# Patient Record
Sex: Female | Born: 1960
Health system: Southern US, Community
[De-identification: ages and names within clinical notes are randomized; demographics above are authoritative.]

## PROBLEM LIST (undated history)

## (undated) DIAGNOSIS — F419 Anxiety disorder, unspecified: Secondary | ICD-10-CM

## (undated) DIAGNOSIS — E669 Obesity, unspecified: Secondary | ICD-10-CM

## (undated) DIAGNOSIS — R569 Unspecified convulsions: Secondary | ICD-10-CM

## (undated) DIAGNOSIS — Z86010 Personal history of colonic polyps: Secondary | ICD-10-CM

## (undated) DIAGNOSIS — M1990 Unspecified osteoarthritis, unspecified site: Secondary | ICD-10-CM

## (undated) DIAGNOSIS — E785 Hyperlipidemia, unspecified: Secondary | ICD-10-CM

## (undated) DIAGNOSIS — K5190 Ulcerative colitis, unspecified, without complications: Secondary | ICD-10-CM

## (undated) DIAGNOSIS — K219 Gastro-esophageal reflux disease without esophagitis: Secondary | ICD-10-CM

## (undated) DIAGNOSIS — K603 Anal fistula, unspecified: Secondary | ICD-10-CM

## (undated) DIAGNOSIS — K449 Diaphragmatic hernia without obstruction or gangrene: Secondary | ICD-10-CM

## (undated) DIAGNOSIS — F329 Major depressive disorder, single episode, unspecified: Secondary | ICD-10-CM

## (undated) DIAGNOSIS — K5730 Diverticulosis of large intestine without perforation or abscess without bleeding: Secondary | ICD-10-CM

## (undated) DIAGNOSIS — K760 Fatty (change of) liver, not elsewhere classified: Secondary | ICD-10-CM

## (undated) DIAGNOSIS — D649 Anemia, unspecified: Secondary | ICD-10-CM

## (undated) DIAGNOSIS — F319 Bipolar disorder, unspecified: Secondary | ICD-10-CM

## (undated) DIAGNOSIS — F3289 Other specified depressive episodes: Secondary | ICD-10-CM

## (undated) DIAGNOSIS — N189 Chronic kidney disease, unspecified: Secondary | ICD-10-CM

## (undated) DIAGNOSIS — I10 Essential (primary) hypertension: Secondary | ICD-10-CM

## (undated) DIAGNOSIS — E611 Iron deficiency: Secondary | ICD-10-CM

## (undated) DIAGNOSIS — Z87440 Personal history of urinary (tract) infections: Secondary | ICD-10-CM

## (undated) DIAGNOSIS — R7303 Prediabetes: Secondary | ICD-10-CM

## (undated) DIAGNOSIS — K859 Acute pancreatitis without necrosis or infection, unspecified: Secondary | ICD-10-CM

## (undated) HISTORY — PX: KNEE ARTHROSCOPY: SUR90

## (undated) HISTORY — DX: Anal fistula, unspecified: K60.30

## (undated) HISTORY — DX: Gastro-esophageal reflux disease without esophagitis: K21.9

## (undated) HISTORY — DX: Bipolar disorder, unspecified: F31.9

## (undated) HISTORY — DX: Personal history of colonic polyps: Z86.010

## (undated) HISTORY — DX: Hyperlipidemia, unspecified: E78.5

## (undated) HISTORY — PX: TOTAL ABDOMINAL HYSTERECTOMY: SHX209

## (undated) HISTORY — DX: Obesity, unspecified: E66.9

## (undated) HISTORY — DX: Anemia, unspecified: D64.9

## (undated) HISTORY — PX: HEMORROIDECTOMY: SUR656

## (undated) HISTORY — DX: Essential (primary) hypertension: I10

## (undated) HISTORY — DX: Anal fistula: K60.3

## (undated) HISTORY — DX: Fatty (change of) liver, not elsewhere classified: K76.0

## (undated) HISTORY — DX: Iron deficiency: E61.1

## (undated) HISTORY — DX: Prediabetes: R73.03

## (undated) HISTORY — DX: Other specified depressive episodes: F32.89

## (undated) HISTORY — DX: Unspecified osteoarthritis, unspecified site: M19.90

## (undated) HISTORY — DX: Chronic kidney disease, unspecified: N18.9

## (undated) HISTORY — DX: Diaphragmatic hernia without obstruction or gangrene: K44.9

## (undated) HISTORY — DX: Major depressive disorder, single episode, unspecified: F32.9

## (undated) HISTORY — DX: Diverticulosis of large intestine without perforation or abscess without bleeding: K57.30

## (undated) HISTORY — DX: Ulcerative colitis, unspecified, without complications: K51.90

## (undated) HISTORY — DX: Anxiety disorder, unspecified: F41.9

---

## 1997-11-06 ENCOUNTER — Inpatient Hospital Stay (HOSPITAL_COMMUNITY): Admission: EM | Admit: 1997-11-06 | Discharge: 1997-11-11 | Payer: Self-pay | Admitting: Emergency Medicine

## 1998-05-17 ENCOUNTER — Inpatient Hospital Stay (HOSPITAL_COMMUNITY): Admission: EM | Admit: 1998-05-17 | Discharge: 1998-05-18 | Payer: Self-pay | Admitting: Emergency Medicine

## 1998-09-11 ENCOUNTER — Emergency Department (HOSPITAL_COMMUNITY): Admission: EM | Admit: 1998-09-11 | Discharge: 1998-09-12 | Payer: Self-pay | Admitting: Internal Medicine

## 1998-10-06 ENCOUNTER — Emergency Department (HOSPITAL_COMMUNITY): Admission: EM | Admit: 1998-10-06 | Discharge: 1998-10-07 | Payer: Self-pay | Admitting: Emergency Medicine

## 1998-10-27 ENCOUNTER — Emergency Department (HOSPITAL_COMMUNITY): Admission: EM | Admit: 1998-10-27 | Discharge: 1998-10-27 | Payer: Self-pay | Admitting: Emergency Medicine

## 1998-11-05 ENCOUNTER — Emergency Department (HOSPITAL_COMMUNITY): Admission: EM | Admit: 1998-11-05 | Discharge: 1998-11-05 | Payer: Self-pay | Admitting: Emergency Medicine

## 2000-07-19 ENCOUNTER — Emergency Department (HOSPITAL_COMMUNITY): Admission: EM | Admit: 2000-07-19 | Discharge: 2000-07-19 | Payer: Self-pay | Admitting: Emergency Medicine

## 2000-08-24 ENCOUNTER — Encounter (INDEPENDENT_AMBULATORY_CARE_PROVIDER_SITE_OTHER): Payer: Self-pay | Admitting: Specialist

## 2000-08-24 ENCOUNTER — Other Ambulatory Visit: Admission: RE | Admit: 2000-08-24 | Discharge: 2000-08-24 | Payer: Self-pay | Admitting: Obstetrics & Gynecology

## 2000-09-13 ENCOUNTER — Encounter (INDEPENDENT_AMBULATORY_CARE_PROVIDER_SITE_OTHER): Payer: Self-pay

## 2000-09-13 ENCOUNTER — Inpatient Hospital Stay (HOSPITAL_COMMUNITY): Admission: RE | Admit: 2000-09-13 | Discharge: 2000-09-16 | Payer: Self-pay | Admitting: Obstetrics & Gynecology

## 2000-09-13 ENCOUNTER — Encounter (INDEPENDENT_AMBULATORY_CARE_PROVIDER_SITE_OTHER): Payer: Self-pay | Admitting: Specialist

## 2000-10-13 ENCOUNTER — Encounter: Admission: RE | Admit: 2000-10-13 | Discharge: 2000-10-13 | Payer: Self-pay | Admitting: Obstetrics & Gynecology

## 2000-10-13 ENCOUNTER — Encounter: Payer: Self-pay | Admitting: Obstetrics & Gynecology

## 2001-12-11 ENCOUNTER — Other Ambulatory Visit: Admission: RE | Admit: 2001-12-11 | Discharge: 2001-12-11 | Payer: Self-pay | Admitting: Obstetrics and Gynecology

## 2002-11-14 ENCOUNTER — Encounter: Admission: RE | Admit: 2002-11-14 | Discharge: 2002-11-14 | Payer: Self-pay | Admitting: Internal Medicine

## 2003-02-25 ENCOUNTER — Encounter: Payer: Self-pay | Admitting: Gastroenterology

## 2004-01-14 ENCOUNTER — Ambulatory Visit: Payer: Self-pay | Admitting: Internal Medicine

## 2004-03-26 ENCOUNTER — Encounter: Admission: RE | Admit: 2004-03-26 | Discharge: 2004-03-26 | Payer: Self-pay | Admitting: Obstetrics & Gynecology

## 2004-04-15 ENCOUNTER — Ambulatory Visit: Payer: Self-pay | Admitting: Internal Medicine

## 2004-04-30 ENCOUNTER — Ambulatory Visit: Payer: Self-pay | Admitting: Internal Medicine

## 2004-05-15 ENCOUNTER — Ambulatory Visit: Payer: Self-pay | Admitting: Internal Medicine

## 2004-07-22 ENCOUNTER — Ambulatory Visit: Payer: Self-pay | Admitting: Internal Medicine

## 2004-08-31 ENCOUNTER — Ambulatory Visit: Payer: Self-pay | Admitting: Gastroenterology

## 2005-05-05 ENCOUNTER — Ambulatory Visit: Payer: Self-pay | Admitting: Internal Medicine

## 2005-05-18 ENCOUNTER — Ambulatory Visit: Payer: Self-pay | Admitting: Internal Medicine

## 2005-05-21 ENCOUNTER — Ambulatory Visit: Payer: Self-pay | Admitting: Cardiovascular Disease

## 2005-06-22 ENCOUNTER — Ambulatory Visit: Payer: Self-pay | Admitting: Internal Medicine

## 2005-10-07 ENCOUNTER — Ambulatory Visit: Payer: Self-pay | Admitting: Internal Medicine

## 2005-10-26 ENCOUNTER — Ambulatory Visit: Payer: Self-pay | Admitting: Internal Medicine

## 2005-10-28 ENCOUNTER — Encounter (INDEPENDENT_AMBULATORY_CARE_PROVIDER_SITE_OTHER): Payer: Self-pay | Admitting: Specialist

## 2005-10-28 ENCOUNTER — Ambulatory Visit: Admission: RE | Admit: 2005-10-28 | Discharge: 2005-10-28 | Payer: Self-pay | Admitting: Internal Medicine

## 2005-10-28 ENCOUNTER — Ambulatory Visit: Payer: Self-pay | Admitting: Internal Medicine

## 2005-11-09 ENCOUNTER — Ambulatory Visit: Payer: Self-pay | Admitting: Internal Medicine

## 2005-11-11 ENCOUNTER — Ambulatory Visit: Payer: Self-pay | Admitting: Gastroenterology

## 2005-11-12 ENCOUNTER — Ambulatory Visit: Payer: Self-pay | Admitting: Gastroenterology

## 2005-12-17 ENCOUNTER — Ambulatory Visit: Payer: Self-pay | Admitting: Internal Medicine

## 2005-12-29 ENCOUNTER — Ambulatory Visit: Payer: Self-pay | Admitting: Internal Medicine

## 2006-01-24 ENCOUNTER — Ambulatory Visit: Payer: Self-pay | Admitting: Internal Medicine

## 2006-02-21 ENCOUNTER — Ambulatory Visit: Payer: Self-pay | Admitting: Internal Medicine

## 2006-03-23 ENCOUNTER — Ambulatory Visit: Payer: Self-pay | Admitting: Internal Medicine

## 2006-04-20 ENCOUNTER — Ambulatory Visit: Payer: Self-pay | Admitting: Internal Medicine

## 2006-05-02 ENCOUNTER — Ambulatory Visit (HOSPITAL_COMMUNITY): Admission: RE | Admit: 2006-05-02 | Discharge: 2006-05-02 | Payer: Self-pay | Admitting: Gastroenterology

## 2006-05-17 ENCOUNTER — Ambulatory Visit: Payer: Self-pay | Admitting: Internal Medicine

## 2006-05-29 ENCOUNTER — Ambulatory Visit (HOSPITAL_COMMUNITY): Admission: RE | Admit: 2006-05-29 | Discharge: 2006-05-29 | Payer: Self-pay | Admitting: Unknown Physician Specialty

## 2006-06-10 ENCOUNTER — Ambulatory Visit: Payer: Self-pay | Admitting: Internal Medicine

## 2006-07-06 ENCOUNTER — Ambulatory Visit: Payer: Self-pay | Admitting: Internal Medicine

## 2006-07-22 ENCOUNTER — Ambulatory Visit: Payer: Self-pay | Admitting: Gastroenterology

## 2006-08-02 ENCOUNTER — Ambulatory Visit (HOSPITAL_COMMUNITY): Admission: RE | Admit: 2006-08-02 | Discharge: 2006-08-02 | Payer: Self-pay | Admitting: Internal Medicine

## 2006-08-03 ENCOUNTER — Ambulatory Visit: Payer: Self-pay | Admitting: Internal Medicine

## 2006-08-22 ENCOUNTER — Encounter: Admission: RE | Admit: 2006-08-22 | Discharge: 2006-08-22 | Payer: Self-pay | Admitting: Obstetrics & Gynecology

## 2006-08-31 ENCOUNTER — Ambulatory Visit: Payer: Self-pay | Admitting: Pulmonary Disease

## 2006-09-20 ENCOUNTER — Ambulatory Visit: Payer: Self-pay | Admitting: Pulmonary Disease

## 2006-10-18 ENCOUNTER — Ambulatory Visit: Payer: Self-pay | Admitting: Pulmonary Disease

## 2006-12-06 ENCOUNTER — Ambulatory Visit: Payer: Self-pay | Admitting: Pulmonary Disease

## 2006-12-06 DIAGNOSIS — R059 Cough, unspecified: Secondary | ICD-10-CM

## 2006-12-06 DIAGNOSIS — K219 Gastro-esophageal reflux disease without esophagitis: Secondary | ICD-10-CM | POA: Insufficient documentation

## 2006-12-06 DIAGNOSIS — F17200 Nicotine dependence, unspecified, uncomplicated: Secondary | ICD-10-CM

## 2006-12-06 DIAGNOSIS — R05 Cough: Secondary | ICD-10-CM

## 2006-12-06 DIAGNOSIS — F319 Bipolar disorder, unspecified: Secondary | ICD-10-CM

## 2006-12-06 HISTORY — DX: Cough, unspecified: R05.9

## 2006-12-06 HISTORY — DX: Bipolar disorder, unspecified: F31.9

## 2006-12-06 HISTORY — DX: Nicotine dependence, unspecified, uncomplicated: F17.200

## 2007-01-23 ENCOUNTER — Ambulatory Visit: Payer: Self-pay | Admitting: Pulmonary Disease

## 2007-02-03 ENCOUNTER — Ambulatory Visit: Payer: Self-pay | Admitting: Pulmonary Disease

## 2007-03-03 ENCOUNTER — Ambulatory Visit (HOSPITAL_BASED_OUTPATIENT_CLINIC_OR_DEPARTMENT_OTHER): Admission: RE | Admit: 2007-03-03 | Discharge: 2007-03-03 | Payer: Self-pay | Admitting: Orthopedic Surgery

## 2007-04-03 ENCOUNTER — Telehealth (INDEPENDENT_AMBULATORY_CARE_PROVIDER_SITE_OTHER): Payer: Self-pay | Admitting: *Deleted

## 2007-04-03 ENCOUNTER — Ambulatory Visit: Payer: Self-pay | Admitting: Pulmonary Disease

## 2007-04-21 ENCOUNTER — Ambulatory Visit (HOSPITAL_BASED_OUTPATIENT_CLINIC_OR_DEPARTMENT_OTHER): Admission: RE | Admit: 2007-04-21 | Discharge: 2007-04-21 | Payer: Self-pay | Admitting: Orthopedic Surgery

## 2007-05-04 ENCOUNTER — Telehealth (INDEPENDENT_AMBULATORY_CARE_PROVIDER_SITE_OTHER): Payer: Self-pay | Admitting: *Deleted

## 2007-05-12 ENCOUNTER — Ambulatory Visit: Payer: Self-pay | Admitting: Pulmonary Disease

## 2007-05-13 ENCOUNTER — Telehealth: Payer: Self-pay | Admitting: Pulmonary Disease

## 2007-05-15 ENCOUNTER — Telehealth (INDEPENDENT_AMBULATORY_CARE_PROVIDER_SITE_OTHER): Payer: Self-pay | Admitting: *Deleted

## 2007-05-17 ENCOUNTER — Telehealth: Payer: Self-pay | Admitting: Pulmonary Disease

## 2007-09-11 ENCOUNTER — Telehealth: Payer: Self-pay | Admitting: Internal Medicine

## 2007-09-12 ENCOUNTER — Ambulatory Visit: Payer: Self-pay | Admitting: Gastroenterology

## 2007-09-12 DIAGNOSIS — K449 Diaphragmatic hernia without obstruction or gangrene: Secondary | ICD-10-CM

## 2007-09-12 DIAGNOSIS — K649 Unspecified hemorrhoids: Secondary | ICD-10-CM

## 2007-09-12 DIAGNOSIS — F329 Major depressive disorder, single episode, unspecified: Secondary | ICD-10-CM | POA: Insufficient documentation

## 2007-09-12 DIAGNOSIS — K6289 Other specified diseases of anus and rectum: Secondary | ICD-10-CM

## 2007-09-12 DIAGNOSIS — K5190 Ulcerative colitis, unspecified, without complications: Secondary | ICD-10-CM | POA: Insufficient documentation

## 2007-09-12 HISTORY — DX: Diaphragmatic hernia without obstruction or gangrene: K44.9

## 2007-09-12 HISTORY — DX: Other specified diseases of anus and rectum: K62.89

## 2007-09-12 HISTORY — DX: Unspecified hemorrhoids: K64.9

## 2007-09-13 ENCOUNTER — Encounter: Payer: Self-pay | Admitting: Gastroenterology

## 2007-09-14 ENCOUNTER — Telehealth: Payer: Self-pay | Admitting: Gastroenterology

## 2007-09-18 ENCOUNTER — Encounter
Admission: RE | Admit: 2007-09-18 | Discharge: 2007-09-18 | Payer: Self-pay | Admitting: Physical Medicine & Rehabilitation

## 2007-10-04 ENCOUNTER — Encounter: Payer: Self-pay | Admitting: Gastroenterology

## 2007-10-25 ENCOUNTER — Ambulatory Visit: Payer: Self-pay | Admitting: Pulmonary Disease

## 2007-11-17 ENCOUNTER — Encounter (INDEPENDENT_AMBULATORY_CARE_PROVIDER_SITE_OTHER): Payer: Self-pay | Admitting: *Deleted

## 2007-11-17 ENCOUNTER — Ambulatory Visit (HOSPITAL_COMMUNITY): Admission: RE | Admit: 2007-11-17 | Discharge: 2007-11-17 | Payer: Self-pay | Admitting: *Deleted

## 2007-11-30 ENCOUNTER — Encounter: Payer: Self-pay | Admitting: Gastroenterology

## 2008-02-23 HISTORY — PX: FINGER AMPUTATION: SHX636

## 2008-02-24 ENCOUNTER — Telehealth: Payer: Self-pay | Admitting: Internal Medicine

## 2008-05-06 ENCOUNTER — Telehealth (INDEPENDENT_AMBULATORY_CARE_PROVIDER_SITE_OTHER): Payer: Self-pay | Admitting: *Deleted

## 2008-06-13 ENCOUNTER — Emergency Department (HOSPITAL_COMMUNITY): Admission: EM | Admit: 2008-06-13 | Discharge: 2008-06-13 | Payer: Self-pay | Admitting: Emergency Medicine

## 2008-07-03 ENCOUNTER — Ambulatory Visit (HOSPITAL_BASED_OUTPATIENT_CLINIC_OR_DEPARTMENT_OTHER): Admission: RE | Admit: 2008-07-03 | Discharge: 2008-07-03 | Payer: Self-pay | Admitting: Plastic Surgery

## 2008-07-03 ENCOUNTER — Encounter (INDEPENDENT_AMBULATORY_CARE_PROVIDER_SITE_OTHER): Payer: Self-pay | Admitting: Plastic Surgery

## 2008-09-25 ENCOUNTER — Telehealth: Payer: Self-pay | Admitting: Gastroenterology

## 2008-09-27 ENCOUNTER — Ambulatory Visit: Payer: Self-pay | Admitting: Gastroenterology

## 2008-09-27 ENCOUNTER — Telehealth: Payer: Self-pay | Admitting: Physician Assistant

## 2008-09-27 DIAGNOSIS — R110 Nausea: Secondary | ICD-10-CM | POA: Insufficient documentation

## 2008-09-27 DIAGNOSIS — R1084 Generalized abdominal pain: Secondary | ICD-10-CM

## 2008-09-27 DIAGNOSIS — A09 Infectious gastroenteritis and colitis, unspecified: Secondary | ICD-10-CM

## 2008-09-27 DIAGNOSIS — K5120 Ulcerative (chronic) proctitis without complications: Secondary | ICD-10-CM | POA: Insufficient documentation

## 2008-09-27 DIAGNOSIS — R112 Nausea with vomiting, unspecified: Secondary | ICD-10-CM | POA: Insufficient documentation

## 2008-09-27 DIAGNOSIS — R197 Diarrhea, unspecified: Secondary | ICD-10-CM

## 2008-09-27 HISTORY — DX: Nausea: R11.0

## 2008-09-27 HISTORY — DX: Nausea with vomiting, unspecified: R11.2

## 2008-09-27 HISTORY — DX: Infectious gastroenteritis and colitis, unspecified: A09

## 2008-09-27 HISTORY — DX: Generalized abdominal pain: R10.84

## 2008-09-27 HISTORY — DX: Diarrhea, unspecified: R19.7

## 2008-09-28 ENCOUNTER — Telehealth: Payer: Self-pay | Admitting: Internal Medicine

## 2008-09-30 ENCOUNTER — Telehealth: Payer: Self-pay | Admitting: Gastroenterology

## 2008-09-30 DIAGNOSIS — R1013 Epigastric pain: Secondary | ICD-10-CM | POA: Insufficient documentation

## 2008-10-01 ENCOUNTER — Encounter: Payer: Self-pay | Admitting: Physician Assistant

## 2008-10-01 LAB — CONVERTED CEMR LAB
AST: 72 units/L — ABNORMAL HIGH (ref 0–37)
Alkaline Phosphatase: 75 units/L (ref 39–117)
BUN: 24 mg/dL — ABNORMAL HIGH (ref 6–23)
Basophils Relative: 1.1 % (ref 0.0–3.0)
Creatinine, Ser: 0.9 mg/dL (ref 0.4–1.2)
Eosinophils Absolute: 0.1 10*3/uL (ref 0.0–0.7)
Hemoglobin: 13.6 g/dL (ref 12.0–15.0)
MCHC: 34.7 g/dL (ref 30.0–36.0)
MCV: 93.7 fL (ref 78.0–100.0)
Monocytes Absolute: 0.5 10*3/uL (ref 0.1–1.0)
Neutro Abs: 1.8 10*3/uL (ref 1.4–7.7)
RBC: 4.18 M/uL (ref 3.87–5.11)
Total Bilirubin: 0.8 mg/dL (ref 0.3–1.2)

## 2008-10-02 ENCOUNTER — Ambulatory Visit (HOSPITAL_COMMUNITY): Admission: RE | Admit: 2008-10-02 | Discharge: 2008-10-02 | Payer: Self-pay | Admitting: Gastroenterology

## 2008-10-03 ENCOUNTER — Ambulatory Visit: Payer: Self-pay | Admitting: *Deleted

## 2008-10-03 ENCOUNTER — Emergency Department (HOSPITAL_COMMUNITY): Admission: EM | Admit: 2008-10-03 | Discharge: 2008-10-03 | Payer: Self-pay | Admitting: Emergency Medicine

## 2008-10-03 ENCOUNTER — Inpatient Hospital Stay (HOSPITAL_COMMUNITY): Admission: RE | Admit: 2008-10-03 | Discharge: 2008-10-07 | Payer: Self-pay | Admitting: *Deleted

## 2008-10-11 ENCOUNTER — Telehealth: Payer: Self-pay | Admitting: Gastroenterology

## 2008-10-17 ENCOUNTER — Telehealth (INDEPENDENT_AMBULATORY_CARE_PROVIDER_SITE_OTHER): Payer: Self-pay | Admitting: *Deleted

## 2008-10-21 ENCOUNTER — Telehealth: Payer: Self-pay | Admitting: Physician Assistant

## 2009-02-02 ENCOUNTER — Emergency Department (HOSPITAL_COMMUNITY): Admission: EM | Admit: 2009-02-02 | Discharge: 2009-02-02 | Payer: Self-pay | Admitting: Emergency Medicine

## 2009-02-03 ENCOUNTER — Emergency Department (HOSPITAL_COMMUNITY): Admission: EM | Admit: 2009-02-03 | Discharge: 2009-02-03 | Payer: Self-pay | Admitting: Emergency Medicine

## 2009-02-10 ENCOUNTER — Emergency Department (HOSPITAL_COMMUNITY): Admission: EM | Admit: 2009-02-10 | Discharge: 2009-02-10 | Payer: Self-pay | Admitting: Emergency Medicine

## 2009-02-17 ENCOUNTER — Emergency Department (HOSPITAL_COMMUNITY): Admission: EM | Admit: 2009-02-17 | Discharge: 2009-02-17 | Payer: Self-pay | Admitting: Emergency Medicine

## 2009-03-10 ENCOUNTER — Ambulatory Visit: Payer: Self-pay | Admitting: Family Medicine

## 2009-03-10 LAB — CONVERTED CEMR LAB
Albumin: 4.2 g/dL (ref 3.5–5.2)
Alkaline Phosphatase: 71 units/L (ref 39–117)
BUN: 16 mg/dL (ref 6–23)
CO2: 24 meq/L (ref 19–32)
Glucose, Bld: 126 mg/dL — ABNORMAL HIGH (ref 70–99)
Hgb A1c MFr Bld: 5.3 % (ref 4.6–6.1)
Leukocytes, UA: NEGATIVE
Nitrite: NEGATIVE
Potassium: 3.9 meq/L (ref 3.5–5.3)
Protein, ur: 100 mg/dL — AB
Squamous Epithelial / LPF: NONE SEEN /lpf
Urine Glucose: NEGATIVE mg/dL
Urobilinogen, UA: 0.2 (ref 0.0–1.0)

## 2009-03-12 ENCOUNTER — Ambulatory Visit: Payer: Self-pay | Admitting: Family Medicine

## 2009-04-16 ENCOUNTER — Telehealth (INDEPENDENT_AMBULATORY_CARE_PROVIDER_SITE_OTHER): Payer: Self-pay | Admitting: *Deleted

## 2009-05-25 ENCOUNTER — Emergency Department (HOSPITAL_COMMUNITY): Admission: EM | Admit: 2009-05-25 | Discharge: 2009-05-25 | Payer: Self-pay | Admitting: Family Medicine

## 2009-07-14 ENCOUNTER — Ambulatory Visit: Payer: Self-pay | Admitting: Family Medicine

## 2009-07-15 ENCOUNTER — Encounter (INDEPENDENT_AMBULATORY_CARE_PROVIDER_SITE_OTHER): Payer: Self-pay | Admitting: Family Medicine

## 2009-08-29 ENCOUNTER — Telehealth: Payer: Self-pay | Admitting: Gastroenterology

## 2009-09-01 ENCOUNTER — Ambulatory Visit: Payer: Self-pay | Admitting: Internal Medicine

## 2009-09-03 ENCOUNTER — Ambulatory Visit (HOSPITAL_COMMUNITY): Admission: RE | Admit: 2009-09-03 | Discharge: 2009-09-03 | Payer: Self-pay | Admitting: Neurosurgery

## 2009-10-06 ENCOUNTER — Ambulatory Visit: Payer: Self-pay | Admitting: Family Medicine

## 2009-10-06 LAB — CONVERTED CEMR LAB
CRP: 0.2 mg/dL (ref <0.6)
Sed Rate: 15 mm/hr (ref 0–22)

## 2009-10-07 ENCOUNTER — Encounter (INDEPENDENT_AMBULATORY_CARE_PROVIDER_SITE_OTHER): Payer: Self-pay | Admitting: Family Medicine

## 2009-10-08 ENCOUNTER — Encounter (INDEPENDENT_AMBULATORY_CARE_PROVIDER_SITE_OTHER): Payer: Self-pay | Admitting: *Deleted

## 2009-10-08 ENCOUNTER — Telehealth: Payer: Self-pay | Admitting: Gastroenterology

## 2009-10-10 ENCOUNTER — Ambulatory Visit: Payer: Self-pay | Admitting: Gastroenterology

## 2009-10-10 ENCOUNTER — Encounter (INDEPENDENT_AMBULATORY_CARE_PROVIDER_SITE_OTHER): Payer: Self-pay | Admitting: *Deleted

## 2009-10-13 ENCOUNTER — Encounter: Payer: Self-pay | Admitting: Gastroenterology

## 2009-10-15 ENCOUNTER — Ambulatory Visit: Payer: Self-pay | Admitting: Gastroenterology

## 2009-10-16 DIAGNOSIS — Z860100 Personal history of colon polyps, unspecified: Secondary | ICD-10-CM

## 2009-10-16 HISTORY — DX: Personal history of colonic polyps: Z86.010

## 2009-10-16 HISTORY — DX: Personal history of colon polyps, unspecified: Z86.0100

## 2009-10-19 ENCOUNTER — Emergency Department (HOSPITAL_COMMUNITY): Admission: EM | Admit: 2009-10-19 | Discharge: 2009-10-19 | Payer: Self-pay | Admitting: Emergency Medicine

## 2009-10-19 ENCOUNTER — Emergency Department (HOSPITAL_COMMUNITY): Admission: EM | Admit: 2009-10-19 | Discharge: 2009-10-19 | Payer: Self-pay | Admitting: Family Medicine

## 2009-10-20 ENCOUNTER — Encounter: Payer: Self-pay | Admitting: Gastroenterology

## 2009-10-22 DIAGNOSIS — Z860100 Personal history of colon polyps, unspecified: Secondary | ICD-10-CM | POA: Insufficient documentation

## 2009-10-22 DIAGNOSIS — K5730 Diverticulosis of large intestine without perforation or abscess without bleeding: Secondary | ICD-10-CM | POA: Insufficient documentation

## 2009-10-28 ENCOUNTER — Ambulatory Visit: Payer: Self-pay | Admitting: Gastroenterology

## 2010-01-05 ENCOUNTER — Emergency Department (HOSPITAL_COMMUNITY)
Admission: EM | Admit: 2010-01-05 | Discharge: 2010-01-05 | Payer: Self-pay | Source: Home / Self Care | Admitting: Emergency Medicine

## 2010-03-18 ENCOUNTER — Ambulatory Visit (HOSPITAL_COMMUNITY)
Admission: RE | Admit: 2010-03-18 | Discharge: 2010-03-18 | Payer: Self-pay | Source: Home / Self Care | Attending: Family Medicine | Admitting: Family Medicine

## 2010-03-26 NOTE — Procedures (Signed)
Summary: Colonoscopy  Patient: [name] Note: All result statuses are Final unless otherwise noted.  Tests: (1) Colonoscopy (COL)   COL Colonoscopy           DONE     [facility]     [address]     [place], [place]  [address]           COLONOSCOPY PROCEDURE REPORT           PATIENT:  [name], [name]  MR#:  [id number]     BIRTHDATE:  07-May-1960, 48 yrs. old  GENDER:  female     ENDOSCOPIST:  Vania Rea. Jarold Motto, MD, [facility]     REF. BY:     PROCEDURE DATE:  10/15/2009     PROCEDURE:  Colonoscopy with biopsy and snare polypectomy     ASA CLASS:  Class II     INDICATIONS:  Abdominal pain, hematochezia     MEDICATIONS:   Fentanyl 100 mcg IV, Versed 9 mg IV           DESCRIPTION OF PROCEDURE:   After the risks benefits and     alternatives of the procedure were thoroughly explained, informed     consent was obtained.  Digital rectal exam was performed and     revealed tender.   The [id number] endoscope was introduced     through the anus and advanced to the cecum, which was identified     by both the appendix and ileocecal valve, without limitations.     The quality of the prep was excellent, using MoviPrep.  The     instrument was then slowly withdrawn as the colon was fully     examined.     <<PROCEDUREIMAGES>>           FINDINGS:  Mild diverticulosis was found in the sigmoid to     descending colon segments.  A sessile polyp was found. 5mm flat     polyp hot snare excised from rectum.  This was otherwise a normal     examination of the colon. rectal biopsies done.no colitis     visualized.  External hemorrhoids were found.   Retroflexed views     in the rectum revealed no abnormalities.    The scope was then     withdrawn from the patient and the procedure completed.           COMPLICATIONS:  None     ENDOSCOPIC IMPRESSION:     1) Mild diverticulosis in the sigmoid to descending colon     segments     2) Sessile polyp     3) Otherwise normal examination  4) External hemorrhoids     1.PROBABLE HEALED PROCTITIS PER HX. OF LEFT SIDED UC.     2.R/O ADENOMA     RECOMMENDATIONS:     1) Await pathology results     2) Titrate to need     3) High fiber diet.     4) Out patient follow-up in 2 weeks.     5) Repeat Colonoscopy in 5 years.     REPEAT EXAM:  No           ______________________________     Vania Rea. Jarold Motto, MD, [facility]           CC:           n.     eSIGNED:   Vania Rea. Patterson at 10/15/2009 02:41 PM  Kelsy, [place], [id number]  Note: An exclamation mark (!) indicates a result that was not dispersed into the flowsheet. Document Creation Date: 10/15/2009 2:42 PM _______________________________________________________________________  (1) Order result status: Final Collection or observation date-time: 10/15/2009 14:34 Requested date-time:  Receipt date-time:  Reported date-time:  Referring Physician:   Ordering Physician: [name] [phone]) Specimen Source:  Source: Launa Grill Order Number: [id number] Lab site:   Appended Document: Colonoscopy     Procedures Next Due Date:    Colonoscopy: 09/2014

## 2010-03-26 NOTE — Letter (Signed)
Summary: New Patient letter  [facility]  [address] [place], [place] [address]   Phone: [phone]  Fax: [phone]       10/08/2009 MRN: [id number]  [facility] [address] [place], [place]  [address]  Dear [name] [name],  Welcome to the Gastroenterology Division at [facility].    You are scheduled to see Dr. Jarold Motto on 12/04/2009 at 9:00AM on the 3rd floor at [facility], [address]  We ask that you try to arrive at our office 15 minutes prior to your appointment time to allow for check-in.  We would like you to complete the enclosed self-administered evaluation form prior to your visit and bring it with you on the day of your appointment.  We will review it with you.  Also, please bring a complete list of all your medications or, if you prefer, bring the medication bottles and we will list them.  Please bring your insurance card so that we may make a copy of it.  If your insurance requires a referral to see a specialist, please bring your referral form from your primary care physician.  Co-payments are due at the time of your visit and may be paid by cash, check or credit card.     Your office visit will consist of a consult with your physician (includes a physical exam), any laboratory testing he/she may order, scheduling of any necessary diagnostic testing (e.g. x-ray, ultrasound, CT-scan), and scheduling of a procedure (e.g. Endoscopy, Colonoscopy) if required.  Please allow enough time on your schedule to allow for any/all of these possibilities.    If you cannot keep your appointment, please call [phone] to cancel or reschedule prior to your appointment date.  This allows [place] the opportunity to schedule an appointment for another patient in need of care.  If you do not cancel or reschedule by 5 p.m. the business day prior to your appointment date, you will be charged a $50.00 late cancellation/no-show fee.    Thank you for choosing  [facility] for your medical needs.  We appreciate the opportunity to care for you.  Please visit [place] at our website  to learn more about our practice.                     Sincerely,                                                             The Gastroenterology Division

## 2010-03-26 NOTE — Letter (Signed)
Summary: Patient Notice- Colon Biospy Results  [facility]  [address] [place], [place] [address]   Phone: [phone]  Fax: [phone]        October 20, 2009 MRN: [id number]    [facility] [address] [place], [place]  [address]    Dear Ms. Vaccaro,  I am pleased to inform you that the biopsies taken during your recent colonoscopy did not show any evidence of cancer upon pathologic examination.  Additional information/recommendations:  __No further action is needed at this time.  Please follow-up with      your primary care physician for your other healthcare needs.  __Please call [phone] to schedule a return visit to review      your condition.  x__Continue with the treatment plan as outlined on the day of your      exam.Biopsies are consistent healed proctitis.  __You should have a repeat colonoscopy examination for this problem           in _ years.  Please call us if you are having persistent problems or have questions about your condition that have not been fully answered at this time.  Sincerely,  Mardella Layman MD [facility]   This letter has been electronically signed by your physician.  Appended Document: Patient Notice- Colon Biospy Results letter mailed 9.2.11

## 2010-03-26 NOTE — Assessment & Plan Note (Signed)
Summary: f/u from COL--ch.    History of Present Illness Primary GI MD: [name] MD FACP FAGA Primary Xinyi Batton: Syliva Overman, MD Chief Complaint: f/u after colonoscopy, pt doing very well and denies bleeding or hemorrhiod problems. Pt has stopped the Canasa b/c she hasn't had any bleeding.  History of Present Illness:   no current problems with diarrhea, rectal bleeding, or abdominal cramping. She continues with acid reflux taking p.r.n. H2 blockers. There is no associated dysphagia. Colon biopsies were consistent with chronic healed colitis.   GI Review of Systems      Denies abdominal pain, acid reflux, belching, bloating, chest pain, dysphagia with liquids, dysphagia with solids, heartburn, loss of appetite, nausea, vomiting, vomiting blood, weight loss, and  weight gain.        Denies anal fissure, black tarry stools, change in bowel habit, constipation, diarrhea, diverticulosis, fecal incontinence, heme positive stool, hemorrhoids, irritable bowel syndrome, jaundice, light color stool, liver problems, rectal bleeding, and  rectal pain.    Current Medications (verified): 1)  Abilify 15 Mg Tabs (Aripiprazole) .... One Tablet By Mouth Once Daily 2)  Lamictal 100 Mg Tabs (Lamotrigine) .... One Tablet By Mouth Two Times A Day 3)  Delsym 30 Mg/60ml  Lqcr (Dextromethorphan Polistirex) .... Two Times A Day Prn 4)  Promethazine Hcl 25 Mg  Tabs (Promethazine Hcl) .... 1/2-1 By Mouth Every 8 Hrs As Needed For Nausea and Vomiting 5)  Prilosec Otc 20 Mg Tbec (Omeprazole Magnesium) .... Take 1 Tab 30 Min Prior To Breakfast and Dinner 6)  Promethazine Hcl 25 Mg Tabs (Promethazine Hcl) .... Take 1/2 To 1 Tab  Every 6 Hours As Needed For Nausea 7)  Famotidine 20 Mg Tabs (Famotidine) .... One Tablet By Mouth Two Times A Day 8)  Prozac 20 Mg Caps (Fluoxetine Hcl) .... One Tablet By Mouth Two Times A Day 9)  Promethazine Hcl 25 Mg Supp (Promethazine Hcl) .... One Suppository Into Rectum As Needed  Every 6-8 Hours 10)  Vicodin 5-500 Mg Tabs (Hydrocodone-Acetaminophen) .... One Tablet By Mouth Four Times A Day 11)  Imodium A-D 2 Mg Tabs (Loperamide Hcl) .... As Needed  Allergies (verified): No Known Drug Allergies  Past History:  Past medical, surgical, family and social histories (including risk factors) reviewed for relevance to current acute and chronic problems.  Past Medical History: Reviewed history from 10/10/2009 and no changes required. Family history of OVARIAN CANCER (ICD-V16.41) Family history of DIABETES (ICD-V18.0) Hx of TOBACCO ABUSE (ICD-305.1) Hx of BIPOLAR AFFECTIVE DISORDER (ICD-296.80) Hx of GASTROESOPHAGEAL REFLUX DISEASE (ICD-530.81) ULCERATIVE PROCTITS 2005  Kidney Stones  Past Surgical History: Reviewed history from 09/27/2008 and no changes required.  Total Abdominal Hysterectomy Finger amputation 2010 2 Knee arthroscopy Hemorrhoidectomy  Family History: Reviewed history from 09/27/2008 and no changes required. Diabetes MI/Heart Attack Ovarian Cancer Family History of Irritable Bowel Syndrome:mother  Social History: Reviewed history from 09/27/2008 and no changes required. 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 Patient is a current smoker.  quit 2 weeks ago Alcohol Use - no Illicit Drug Use - no  Review of Systems  The patient denies allergy/sinus, anemia, anxiety-new, arthritis/joint pain, back pain, blood in urine, breast changes/lumps, change in vision, confusion, cough, coughing up blood, depression-new, fainting, fatigue, fever, headaches-new, hearing problems, heart murmur, heart rhythm changes, itching, menstrual pain, muscle pains/cramps, night sweats, nosebleeds, pregnancy symptoms, shortness of breath, skin rash, sleeping problems, sore throat, swelling of feet/legs, swollen lymph glands, thirst - excessive , urination - excessive ,  urination changes/pain, urine leakage, vision changes, and voice change.    Vital  Signs:  Patient profile:   50 year old female Height:      65 inches Weight:      231.25 pounds BMI:     38.62 Pulse rate:   80 / minute Pulse rhythm:   regular BP sitting:   122 / 74  (right arm) Cuff size:   regular  Vitals Entered By: Christie Nottingham CMA Duncan Dull) (October 28, 2009 9:55 AM)  Physical Exam  General:  Well developed, well nourished, no acute distress.healthy appearing and obese.   Head:  Normocephalic and atraumatic. Eyes:  PERRLA, no icterus. Psych:  Alert and cooperative. Normal mood and affect.   Impression & Recommendations:  Problem # 1:  ULCERATIVE PROCTITIS (ICD-556.2) Assessment Improved She Does not have chronic relapsing colitis, and we will see how she does symptomatically off all medications at this time. She may need chronic oral amino salicylate therapy and periodic Canasa suppositories.  Problem # 2:  GERD (ICD-530.81) Assessment: Deteriorated Reflux Regime reviewed, and I have given her samples of omeprazole 20 mg to take 30 minutes before supper because most of her symptoms are nocturnal in nature.  Problem # 3:  ABDOMINAL PAIN -GENERALIZED (ICD-789.07) Assessment: Improved  She has an element of chronic IBS.  Patient Instructions: 1)  Copy sent to : Syliva Overman, MD 2)  Please continue current medications.  3)  Take your Prilosec 30 min before supper. 4)  Avoid foods high in acid content ( tomatoes, citrus juices, spicy foods) . Avoid eating within 3 to 4 hours of lying down or before exercising. Do not over eat; try smaller more frequent meals. Elevate head of bed four inches when sleeping.  5)  GI Reflux brochure given.  6)  The medication list was reviewed and reconciled.  All changed / newly prescribed medications were explained.  A complete medication list was provided to the patient / caregiver.

## 2010-03-26 NOTE — Assessment & Plan Note (Signed)
Summary: Abd pain, rectal bleeding/dfs    History of Present Illness Primary GI MD: [name] MD FACP FAGA Primary Provider: Syliva Overman, MD Chief Complaint: BRB in stool and rectal bleeding x 3 weeks. Pt states she does have known hemorrhiods. Pt also c/o of some LUQ intermittant cramp ache abd pain. Stools have changed back and forth from constipation to diarrhea. History of Present Illness:   50 year old Caucasian female with the idiopathic inflammatory bowel disease he now has 3 weeks of abdominal cramping, diarrhea, and rectal bleeding. She is status post previous hemorrhoidectomy. She has not had colonoscopy in over 5 years. She suffers from chronic pain syndrome and is on regular Vicodin, Prozac, Abilify and Lamictal. She was recent seen by Dr. Carolyne Fiscal at [facility] and placed on Asacol 800 mg t.i.d. This of note that the patient has been on multiple antibiotics for recurrent urinary tract infections. She denies skin rashes, joint pains, oral stomatitis, or NSAID abuse. She does take Prilosec and Prilosec and H2 blockers regularly for acid reflux.   GI Review of Systems    Reports abdominal pain, nausea, and  vomiting.     Location of  Abdominal pain: LUQ.    Denies acid reflux, belching, bloating, chest pain, dysphagia with liquids, dysphagia with solids, heartburn, loss of appetite, vomiting blood, weight loss, and  weight gain.      Reports change in bowel habits, hemorrhoids, and  rectal bleeding.     Denies anal fissure, black tarry stools, constipation, diarrhea, diverticulosis, fecal incontinence, heme positive stool, irritable bowel syndrome, jaundice, light color stool, liver problems, and  rectal pain.    Current Medications (verified): 1)  Abilify 15 Mg Tabs (Aripiprazole) .... One Tablet By Mouth Once Daily 2)  Lamictal 100 Mg Tabs (Lamotrigine) .... One Tablet By Mouth Two Times A Day 3)  Delsym 30 Mg/31ml  Lqcr (Dextromethorphan Polistirex) .... Two Times A Day Prn 4)   Promethazine Hcl 25 Mg  Tabs (Promethazine Hcl) .... 1/2-1 By Mouth Every 8 Hrs As Needed For Nausea and Vomiting 5)  Prilosec Otc 20 Mg Tbec (Omeprazole Magnesium) .... Take 1 Tab 30 Min Prior To Breakfast and Dinner 6)  Promethazine Hcl 25 Mg Tabs (Promethazine Hcl) .... Take 1/2 To 1 Tab  Every 6 Hours As Needed For Nausea 7)  Famotidine 20 Mg Tabs (Famotidine) .... One Tablet By Mouth Two Times A Day 8)  Prozac 20 Mg Caps (Fluoxetine Hcl) .... One Tablet By Mouth Two Times A Day 9)  Promethazine Hcl 25 Mg Supp (Promethazine Hcl) .... One Suppository Into Rectum As Needed Every 6-8 Hours 10)  Vicodin 5-500 Mg Tabs (Hydrocodone-Acetaminophen) .... One Tablet By Mouth Four Times A Day 11)  Asacol 400 Mg Tbec (Mesalamine) .... 2 Tablets By Mouth Three Times A Day X 6 Weeks  Allergies (verified): No Known Drug Allergies  Past History:  Past medical, surgical, family and social histories (including risk factors) reviewed for relevance to current acute and chronic problems.  Past Medical History: Family history of OVARIAN CANCER (ICD-V16.41) Family history of DIABETES (ICD-V18.0) Hx of TOBACCO ABUSE (ICD-305.1) Hx of BIPOLAR AFFECTIVE DISORDER (ICD-296.80) Hx of GASTROESOPHAGEAL REFLUX DISEASE (ICD-530.81) ULCERATIVE PROCTITS 2005  Kidney Stones  Past Surgical History: Reviewed history from 09/27/2008 and no changes required.  Total Abdominal Hysterectomy Finger amputation 2010 2 Knee arthroscopy Hemorrhoidectomy  Family History: Reviewed history from 09/27/2008 and no changes required. Diabetes MI/Heart Attack Ovarian Cancer Family History of Irritable Bowel Syndrome:mother  Social History: Reviewed history  from 09/27/2008 and no changes required. 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 Patient is a current smoker.  quit 2 weeks ago Alcohol Use - no Illicit Drug Use - no  Review of Systems       The patient complains of anxiety-new, arthritis/joint pain,  back pain, blood in urine, cough, depression-new, fatigue, fever, heart rhythm changes, shortness of breath, sleeping problems, sore throat, swelling of feet/legs, thirst - excessive, urination changes/pain, and urine leakage.  The patient denies anemia, breast changes/lumps, change in vision, confusion, coughing up blood, fainting, headaches-new, hearing problems, heart murmur, itching, menstrual pain, muscle pains/cramps, night sweats, nosebleeds, pregnancy symptoms, skin rash, swollen lymph glands, thirst - excessive , urination - excessive , vision changes, and voice change.    Vital Signs:  Patient profile:   50 year old female Height:      65 inches Weight:      228.50 pounds BMI:     38.16 Temp:     98.2 degrees F oral Pulse rate:   80 / minute Pulse rhythm:   regular BP sitting:   124 / 68  (right arm) Cuff size:   regular  Vitals Entered By: Christie Nottingham CMA Duncan Dull) (October 10, 2009 10:42 AM)  Physical Exam  General:  Well developed, well nourished, no acute distress.healthy appearing and obese.   Head:  Normocephalic and atraumatic. Eyes:  PERRLA, no icterus. Lungs:  Clear throughout to auscultation. Heart:  Regular rate and rhythm; no murmurs, rubs,  or bruits. Abdomen:  Soft, nontender and nondistended. No masses, hepatosplenomegaly or hernias noted. Normal bowel sounds.obese.   Rectal:  Normal exam.mucoid That bloody stool present. Extremities:  No clubbing, cyanosis, edema or deformities noted. Neurologic:  Alert and  oriented x4;  grossly normal neurologically. Psych:  Alert and cooperative. Normal mood and affect.   Impression & Recommendations:  Problem # 1:  DIARRHEA (ICD-787.91) Assessment Deteriorated Probable left-sided ulcerative colitis. I have asked her to continue her p.o. mean a salicylates and have added 1 g Canasa suppositories at bedtime. Stool for C. difficile toxin ordered and followup colonoscopy exam scheduled. Orders: T-Culture, C-Diff Toxin A/B  ([id number])  Problem # 2:  NAUSEA AND VOMITING (ICD-787.01) Assessment: Improved Continued treatment for acid reflux and will increase Prilosec to 20 mg twice a day as tolerated.  Problem # 3:  RECTAL PAIN (UJW-119.14) Assessment: Improved status post hemorrhoidectomy. There is no evidence of thrombosed hemorrhoids, fissures, or fistulae on rectal exam.  Problem # 4:  DEPRESSION (ICD-311) Assessment: Improved Continue multiple psychiatric medications per primary care.  Patient Instructions: 1)  Please continue current medications.  2)  Begin Canasa supporsitory at bedtime. 3)  You may use Imodium as needed for diarrhea. 4)  You are scheduled for a colonoscopy. 5)  The medication list was reviewed and reconciled.  All changed / newly prescribed medications were explained.  A complete medication list was provided to the patient / caregiver. 6)  Copy sent to : Dr. Syliva Overman At [facility] 7)  Increase Prilosec to b.i.d. dosage as tolerated  Appended Document: Abd pain, rectal bleeding/dfs    Clinical Lists Changes  Medications: Added new medication of MOVIPREP 100 GM  SOLR (PEG-KCL-NACL-NASULF-NA ASC-C) As per prep instructions. - Signed Added new medication of CANASA 1000 MG  SUPP (MESALAMINE) 1 q hs Added new medication of IMODIUM A-D 2 MG TABS (LOPERAMIDE HCL) as needed Rx of MOVIPREP 100 GM  SOLR (PEG-KCL-NACL-NASULF-NA ASC-C) As per prep instructions.;  #1 x  0;  Signed;  Entered by: Ashok Cordia RN;  Authorized by: Mardella Layman MD [facility];  Method used: Electronically to [id number] [address] [place], [place], [place]  [address], Ph: [phone] or [phone], Fax: [phone] Orders: Added new Test order of Colonoscopy (Colon) - Signed    Prescriptions: MOVIPREP 100 GM  SOLR (PEG-KCL-NACL-NASULF-NA ASC-C) As per prep instructions.  #1 x 0   Entered by:   Ashok Cordia RN   Authorized by:   Mardella Layman MD [facility]   Signed by:    Ashok Cordia RN on 10/10/2009   Method used:   Electronically to        [facility] [address] [phone]* (retail)       [address]       [place], [place]  [address]       Ph: [phone] or [phone]       Fax: [phone]   RxID:   [id number]

## 2010-03-26 NOTE — Letter (Signed)
Summary: [name] [name] Instructions  [facility]  [address] [place], [place] [address]   Phone: [phone]  Fax: [phone]       [name] [name]    04-12-1960    MRN: [id number]        Procedure Day [name] [name]:  Wednesday, 10/15/09     Arrival Time: 1:00         Procedure Time: 2:00     Location of Procedure:                    _X _  [facility] (4th Floor)                         PREPARATION FOR COLONOSCOPY WITH MOVIPREP   Starting 5 days prior to your procedure 10/10/09 do not eat nuts, seeds, popcorn, corn, beans, peas,  salads, or any raw vegetables.  Do not take any fiber supplements (e.g. Metamucil, Citrucel, and Benefiber).  THE DAY BEFORE YOUR PROCEDURE         DATE: 10/14/09    DAY: Tuesday  1.  Drink clear liquids the entire day-NO SOLID FOOD  2.  Do not drink anything colored red or purple.  Avoid juices with pulp.  No orange juice.  3.  Drink at least 64 oz. (8 glasses) of fluid/clear liquids during the day to prevent dehydration and help the prep work efficiently.  CLEAR LIQUIDS INCLUDE: Water Jello Ice Popsicles Tea (sugar ok, no milk/cream) Powdered fruit flavored drinks Coffee (sugar ok, no milk/cream) Gatorade Juice: apple, white grape, white cranberry  Lemonade Clear bullion, consomm, broth Carbonated beverages (any kind) Strained chicken noodle soup Hard Candy                             4.  In the morning, mix first dose of MoviPrep solution:    Empty 1 Pouch A and 1 Pouch B into the disposable container    Add lukewarm drinking water to the top line of the container. Mix to dissolve    Refrigerate (mixed solution should be used within 24 hrs)  5.  Begin drinking the prep at 5:00 p.m. The MoviPrep container is divided by 4 marks.   Every 15 minutes drink the solution down to the next mark (approximately 8 oz) until the full liter is complete.   6.  Follow completed prep with 16 oz of clear liquid of your choice  (Nothing red or purple).  Continue to drink clear liquids until bedtime.  7.  Before going to bed, mix second dose of MoviPrep solution:    Empty 1 Pouch A and 1 Pouch B into the disposable container    Add lukewarm drinking water to the top line of the container. Mix to dissolve    Refrigerate  THE DAY OF YOUR PROCEDURE      DATE: 10/15/09   DAY:  Wednesday  Beginning at 9:00 a.m. (5 hours before procedure):         1. Every 15 minutes, drink the solution down to the next mark (approx 8 oz) until the full liter is complete.  2. Follow completed prep with 16 oz. of clear liquid of your choice.    3. You may drink clear liquids until 12:00 pm (2 HOURS BEFORE PROCEDURE).   MEDICATION INSTRUCTIONS  Unless otherwise instructed, you should take regular prescription medications with a small sip of water  as early as possible the morning of your procedure.              OTHER INSTRUCTIONS  You will need a responsible adult at least 50 years of age to accompany you and drive you home.   This person must remain in the waiting room during your procedure.  Wear loose fitting clothing that is easily removed.  Leave jewelry and other valuables at home.  However, you may wish to bring a book to read or  an iPod/MP3 player to listen to music as you wait for your procedure to start.  Remove all body piercing jewelry and leave at home.  Total time from sign-in until discharge is approximately 2-3 hours.  You should go home directly after your procedure and rest.  You can resume normal activities the  day after your procedure.  The day of your procedure you should not:   Drive   Make legal decisions   Operate machinery   Drink alcohol   Return to work  You will receive specific instructions about eating, activities and medications before you leave.    The above instructions have been reviewed and explained to me by   _______________________    I fully understand  and can verbalize these instructions _____________________________ Date _________

## 2010-03-26 NOTE — Progress Notes (Signed)
   recieved Request from [employer] frow[place] to [place]  [facility]  April 16, 2009 8:35 AM

## 2010-03-26 NOTE — Progress Notes (Signed)
Summary: Triage   Phone Note Call from Patient Call back at Home Phone [phone]   Caller: Patient Call For: Dr. Jarold Motto Reason for Call: Talk to Nurse Summary of Call: Can Phenergan discolor her stool, she also has diarrhea Initial call taken by: Karna Christmas,  August 29, 2009 10:49 AM  Follow-up for Phone Call        Pt has noticed that her stools have been dark green at times.  Occ diarrhea.  No signs of bleeding per pt.  Offered to sch OV but pt states that shehas no insurance and has applied for disability.  She will call back when she has insirance coverage.  Pt instructed to call back if she noticed dark tarry stools or other signs of bleeding. Follow-up by: Ashok Cordia RN,  August 29, 2009 12:47 PM

## 2010-03-26 NOTE — Progress Notes (Signed)
Summary: triage   Phone Note Call from Patient Call back at Home Phone [phone]   Caller: Patient Call For: Dr. Jarold Motto Reason for Call: Talk to Nurse Summary of Call: sch'ed next available with Dr. Jarold Motto, but wants to know if she can be worked in sooner... has seen Amy before Initial call taken by: Vallarie Mare,  October 08, 2009 11:30 AM  Follow-up for Phone Call        LM for pt to call. Ashok Cordia RN  October 08, 2009 11:52 AM  Pt c/o left sided abd pain and rectal bleeding for 3 weeks.  Appt sch with Dr. Jarold Motto 10/10/09. Follow-up by: Ashok Cordia RN,  October 08, 2009 11:59 AM

## 2010-04-13 ENCOUNTER — Telehealth: Payer: Self-pay | Admitting: Gastroenterology

## 2010-04-21 NOTE — Progress Notes (Signed)
Summary: Medication   Phone Note Call from Patient Call back at Home Phone [phone]   Caller: Patient Call For: Dr. Jarold Motto Reason for Call: Talk to Nurse Summary of Call: Wants to know if we have any samples of Prilosec instead of taking the [facility] Initial call taken by: Karna Christmas,  April 13, 2010 4:20 PM  Follow-up for Phone Call        advised samples up front and rxs sent. Follow-up by: Harlow Mares CMA Duncan Dull),  April 13, 2010 4:37 PM    New/Updated Medications: HYDROCORTISONE ACETATE 25 MG SUPP (HYDROCORTISONE ACETATE) remove wrapper and insert into rectum at bedtime as needed OMEPRAZOLE 20 MG CPDR (OMEPRAZOLE) take one by mouth two times a day Prescriptions: HYDROCORTISONE ACETATE 25 MG SUPP (HYDROCORTISONE ACETATE) remove wrapper and insert into rectum at bedtime as needed  #30 x 3   Entered by:   Harlow Mares CMA (AAMA)   Authorized by:   Mardella Layman MD [facility]   Signed by:   Harlow Mares CMA (AAMA) on 04/13/2010   Method used:   Faxed to ...       [facility] (retail)       [address] [place], [place]  [address]       Ph: [phone]       Fax: [phone]   RxID:   [id number] OMEPRAZOLE 20 MG CPDR (OMEPRAZOLE) take one by mouth two times a day  #60 x 6   Entered by:   Harlow Mares CMA (AAMA)   Authorized by:   Mardella Layman MD [facility]   Signed by:   Harlow Mares CMA (AAMA) on 04/13/2010   Method used:   Faxed to ...       [facility] (retail)       [address] [place], [place]  [address]       Ph: [phone]       Fax: [phone]   RxID:   [id number]

## 2010-04-21 NOTE — Progress Notes (Signed)
Summary: Canasa samples   Phone Note Call from Patient Call back at [facility] Phone [phone]   Call For: Dr Jarold Motto Summary of Call: Wonders if she can have some Canasa samples.  Initial call taken by: Leanor Kail [facility],  April 13, 2010 11:21 AM  Follow-up for Phone Call        pt aware [facility] Follow-up by: Harlow Mares CMA (AAMA),  April 13, 2010 11:29 AM    New/Updated Medications: CANASA 1000 MG SUPP (MESALAMINE) insert one into rectum as needed Prescriptions: CANASA 1000 MG SUPP (MESALAMINE) insert one into rectum as needed  #30 x 1   Entered by:   Harlow Mares CMA (AAMA)   Authorized by:   Mardella Layman MD [facility]   Signed by:   Harlow Mares CMA (AAMA) on 04/13/2010   Method used:   Faxed to ...       [facility] (retail)       [address] [place], [place]  [address]       Ph: [phone]       Fax: [phone]   RxID:   [id number]

## 2010-05-07 LAB — DIFFERENTIAL
Lymphocytes Relative: 46 % (ref 12–46)
Lymphs Abs: 3 10*3/uL (ref 0.7–4.0)
Monocytes Absolute: 0.4 10*3/uL (ref 0.1–1.0)
Monocytes Relative: 6 % (ref 3–12)
Neutro Abs: 2.9 10*3/uL (ref 1.7–7.7)
Neutrophils Relative %: 44 % (ref 43–77)

## 2010-05-07 LAB — URINALYSIS, ROUTINE W REFLEX MICROSCOPIC
Bilirubin Urine: NEGATIVE
Nitrite: NEGATIVE
Specific Gravity, Urine: 1.016 (ref 1.005–1.030)
Urobilinogen, UA: 0.2 mg/dL (ref 0.0–1.0)
pH: 5.5 (ref 5.0–8.0)

## 2010-05-07 LAB — POCT CARDIAC MARKERS
CKMB, poc: 1.3 ng/mL (ref 1.0–8.0)
Myoglobin, poc: 52.6 ng/mL (ref 12–200)

## 2010-05-07 LAB — CBC
HCT: 37.4 % (ref 36.0–46.0)
Hemoglobin: 12.4 g/dL (ref 12.0–15.0)
RBC: 3.98 MIL/uL (ref 3.87–5.11)
WBC: 6.5 10*3/uL (ref 4.0–10.5)

## 2010-05-07 LAB — POCT I-STAT, CHEM 8
Chloride: 109 mEq/L (ref 96–112)
Creatinine, Ser: 1.4 mg/dL — ABNORMAL HIGH (ref 0.4–1.2)
Glucose, Bld: 119 mg/dL — ABNORMAL HIGH (ref 70–99)
Hemoglobin: 13.6 g/dL (ref 12.0–15.0)
Potassium: 3.9 mEq/L (ref 3.5–5.1)
Sodium: 140 mEq/L (ref 135–145)

## 2010-05-07 LAB — URINE MICROSCOPIC-ADD ON

## 2010-05-30 LAB — BASIC METABOLIC PANEL
BUN: 18 mg/dL (ref 6–23)
CO2: 25 mEq/L (ref 19–32)
Calcium: 8.7 mg/dL (ref 8.4–10.5)
Chloride: 107 mEq/L (ref 96–112)
Creatinine, Ser: 0.89 mg/dL (ref 0.4–1.2)
Glucose, Bld: 134 mg/dL — ABNORMAL HIGH (ref 70–99)

## 2010-05-30 LAB — DIFFERENTIAL
Basophils Absolute: 0 10*3/uL (ref 0.0–0.1)
Basophils Relative: 1 % (ref 0–1)
Eosinophils Absolute: 0.2 10*3/uL (ref 0.0–0.7)
Monocytes Relative: 4 % (ref 3–12)
Neutro Abs: 2.4 10*3/uL (ref 1.7–7.7)
Neutrophils Relative %: 42 % — ABNORMAL LOW (ref 43–77)

## 2010-05-30 LAB — CBC
MCHC: 34.5 g/dL (ref 30.0–36.0)
MCV: 93.5 fL (ref 78.0–100.0)
Platelets: 233 10*3/uL (ref 150–400)
RBC: 4.09 MIL/uL (ref 3.87–5.11)
RDW: 13.6 % (ref 11.5–15.5)

## 2010-05-30 LAB — URINALYSIS, ROUTINE W REFLEX MICROSCOPIC
Bilirubin Urine: NEGATIVE
Hgb urine dipstick: NEGATIVE
Ketones, ur: NEGATIVE mg/dL
Nitrite: NEGATIVE
Protein, ur: NEGATIVE mg/dL
Urobilinogen, UA: 1 mg/dL (ref 0.0–1.0)

## 2010-05-30 LAB — RAPID URINE DRUG SCREEN, HOSP PERFORMED
Amphetamines: NOT DETECTED
Cocaine: NOT DETECTED
Opiates: POSITIVE — AB
Tetrahydrocannabinol: NOT DETECTED

## 2010-05-30 LAB — PREGNANCY, URINE: Preg Test, Ur: NEGATIVE

## 2010-06-03 LAB — CULTURE, ROUTINE-ABSCESS

## 2010-07-07 NOTE — H&P (Signed)
[name] [name], [name] [name]                ACCOUNT NO.:  [id number]   MEDICAL RECORD NO.:  [id number]          PATIENT TYPE:  IPS   LOCATION:  0303                          FACILITY:  [facility]   PHYSICIAN:  Jasmine Pang, M.D. DATE OF BIRTH:  [birth year]   DATE OF ADMISSION:  10/03/2008  DATE OF DISCHARGE:                       PSYCHIATRIC ADMISSION ASSESSMENT   DATE OF THE ASSESSMENT:  October 04, 2008, at 9:10 a.m.   IDENTIFYING INFORMATION/JUSTIFICATION FOR ADMISSION AND CARE:  A 50-year-  old single female.  This is a voluntary admission.   HISTORY OF PRESENT ILLNESS:  First [facility] admission for this 50 year old  with a history of relapsing on alcohol back in December 2009 with some  intermittent new use, but now drinking daily since about April 2010.  Drinking has escalated up to 8 or 9 mini-bottles of liquor a day.  Prior  to that she had 7-1/2 years of abstinence which she attributes to a  strong spiritual faith and has also been active from time to time in [place].  In April of this past year, she had an injury to her hand with infection  to her right index finger which, after lengthy therapy, resulted in  amputation.  Also during that time, due to her medical issues, she lost  her job.  These two major stressors contributed to her progressive  alcohol use.  She is requesting detox and assistance in making a relapse  prevention plan.  Denying suicidal thoughts.  While receiving medical  treatment during April and May and losing her job, she subsequently lost  her health insurance and cut back on her Lamictal and Abilify doses to  every other day in order to save money.  Recently started on Klonopin  within the past 3 weeks to control some anxiety.  Denies regular use of  benzodiazepines.   PAST PSYCHIATRIC HISTORY:  Followed as an outpatient by Dr. Ezzard Flax  at the [facility], first inpatient admission for detox.  She has a  history of attending [facility] in 1987, for residential  treatment,  and was treated at [facility] for detox in 1999.  She has a history of bipolar  disorder and has been generally compliant with medications until  recently.  No prior suicide attempts, no history of medication use to  prevent alcohol relapse.  No history of disulfiram, naltrexone or  Campral.   SOCIAL HISTORY:  Single female.   BASIC EDUCATION:  Never married.  No children.  Is in a stable  relationship with a partner who is supportive and has 9 years of  abstinence from alcohol.  No legal charges.  Significant medical  stressors recently and loss of income, Medicaid application is pending.  She endorses a solid support system.   FAMILY HISTORY:  Negative for mental illness or substance abuse.   MEDICAL HISTORY:  Primary care Syanne Looney is Dr. Jarold Motto at [facility].   MEDICAL PROBLEMS:  GERD.   PAST MEDICAL HISTORY:  Significant for:  1. Cough secondary to GERD.  2. Problems with hemorrhoids.  3. 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  CURRENT MEDICATIONS:  As follows:  1. Klonopin 1 mg p.o. 2 hours prior to hour of sleep.  2. Tramadol 50 mg t.i.d. p.r.n. knee pain.  3. Promethazine 25 mg 1/2 to 1 tablet q.8 hours p.r.n.  4. Pantoprazole 40 mg 1 daily.  5. HyoMax sublingual 0.125 feet 1 tablet q.6 hours p.r.n.  6. Abdominal spasms.  7. Abilify 15 mg nightly.  8. Lamotrigine 200 mg daily at noon.  9. Align 1 tablet daily.   DRUG ALLERGIES:  None.   PHYSICAL EXAM:  Was done in the emergency room, is noted in the record.   DIAGNOSTIC STUDIES:  Were remarkable for an alcohol level of 137.  Chemistry within normal limits.  Urine drug screen positive for opiates,  negative for all other substances.  Routine urinalysis unremarkable and  CBC normal with platelets 233,000, MCV 93.5, hemoglobin 13.2, hematocrit  38.3.   MENTAL STATUS EXAM:  Fully alert female, coherent, pleasant,  cooperative, good eye contact, composed, normal speech.  Gives a   coherent detailed history.  Affect is neutral.  Mood is neutral.  Thought process is logical, coherent.  No dangerous ideas.  No suicidal  thoughts.  Memory is intact.  No signs of acute withdrawal or delirium.  She refers to her addiction as the monster, is afraid she cannot get  the monster back under control and is requesting possible medications  and other strategies to help prevent relapse after she leaves, which we  have discussed options today.  Insight is good.  Impulse control and  judgment are normal.   Axis I:  1.  Alcohol abuse and dependence.  2.  Bipolar disorder not  otherwise specified.  Axis II:  Deferred.  Axis III:  1.  Gastroesophageal reflux disease.  2.  Recent right index  finger amputation secondary to infection.  Axis IV:  1.  Severe medical issues, now resolving.  2.  Occupational  and economic pressures.  Having a stable home situation and strong  support system and faith are an asset to her.  Axis V:  Current 46, past year not known.   PLAN:  To voluntarily admit her to our dual diagnosis program.  We are  going to continue her lamotrigine and Abilify.  Will discontinue the  Klonopin, which we have discussed today, and continue her other routine  medications, and have discussed some options including our chemical  dependency intensive outpatient program which we will  start to explore and she is on sponsorship from [facility] and it should be noted that her liver enzymes were recently  checked at [facility] and so we will defer those at this time.  She says that she was noted to have a very mild elevation in her  transaminases.      Margaret A. Lorin Picket, N.P.      Jasmine Pang, M.D.  Electronically Signed    MAS/MEDQ  D:  10/04/2008  T:  10/04/2008  Job:  [id number]

## 2010-07-07 NOTE — Op Note (Signed)
[name] [name]                ACCOUNT NO.:  [id number]   MEDICAL RECORD NO.:  [id number]          PATIENT TYPE:  AMB   LOCATION:  DSC                          FACILITY:  [facility]   PHYSICIAN:  [name], MD DATE OF BIRTH:  [birth year]   DATE OF PROCEDURE:  07/03/2008  DATE OF DISCHARGE:                               OPERATIVE REPORT   PREOPERATIVE DIAGNOSIS:  Recurrent right index finger infection.   POSTOPERATIVE DIAGNOSIS:  Recurrent right index finger infection.   SURGEON:  [name], MD   ASSISTANT:  None.   PROCEDURE PERFORMED:  Right index finger ray amputation.   COMPLICATIONS:  None.   ESTIMATED BLOOD LOSS:  None.   DRAINS:  None.   INTRAVENOUS FLUIDS:  Per Anesthesia.   URINE OUTPUT:  Not recorded.   CLINICAL INDICATION:  [name] [name] is a 50 year old right-hand-dominant  female who sustained a penetrating injury to her right index finger  approximately 1 month ago.  She was seen at Urgent Care Facility  multiple times where incision and drainage procedures were performed.  I  saw her approximately 2 weeks later when she had a advanced felon of a  right fingertip.  I decompressed her finger pulp at that time and she  continued to pack her wound and continued on antibiotics as an  outpatient.  She has not cleared her infection in that time and has  developed a progressively swollen and painful index finger with limited  range of motion.  She presents at this time for definitive care.  After  discussion of possible salvage attempts and levels of amputation of her  index finger, she and I decided that the ray amputation of her index  finger would be the best option for her.  I told her that the risks of  surgery include, but are not limited to ongoing infection, bleeding,  damage to the nearby structures, chronic pain, sensitivity in the area  of her amputation, stiffness, and scarring.  [name] [name] understands these  risks and desires to proceed.   DESCRIPTION OF OPERATION:  The patient ambulated to the operating room,  laid down supine position on the operating room table.  After a smooth  and routine induction of general anesthesia with laryngeal mask airway,  the right upper extremity was prepped with Betadine scrub and paint, and  draped into a sterile field.  Previously, a well-padded pneumatic  tourniquet had been placed on the arm.  Her right upper extremity was  elevated for 1 minute with compression on the brachial artery and the  tourniquet was then inflated.  A fishmouth incision was designed over  the MCP joint of the right index finger and incision was made with a 15  blade.  Superficial veins were controlled with bipolar electrocautery.  The extensor tendon apparatus was transected with a 15 blade and tension  was put on the flexor tendons.  They were transected sharply well under  tension.  Next, the digital nerves were identified and bipolared.  They  were then retracted out of the wound.  Next, the finger was  disarticulated at the  MCP joint with a 15 blade.  The fingers were then  passed off the table and sent to Pathology.  Next, a 45-degree bevel was  designed across the metacarpal head of the index finger and cut was made  through the bone with a sagittal saw.  Next, the soft tissue of the palm  was soaked to the dorsum of the index finger.  The tourniquet was  deflated and the wound was irrigated with normal saline.  Hemostasis was  obtained with bipolar electrocautery.  A 15 mL of 0.5% Marcaine plain  were injected around the index finger and the superficial branch of the  radial nerve for postoperative analgesia.  The skin was trimmed to fit  with the first web space under maximal stretch.  Next, the skin was  closed with 4-0 nylon sutures.  Xeroform and a volar resting splint were  applied.  The patient was extubated and transported to the recovery room  in stable condition.   Sponge and needle counts were  reported as correct at the conclusion of  the case.      [name], MD  Electronically Signed     CF/MEDQ  D:  07/03/2008  T:  07/03/2008  Job:  [id number]

## 2010-07-07 NOTE — Assessment & Plan Note (Signed)
[facility]                                 ON-CALL NOTE   NAME:[name], [name] CESPEDES                       MRN:          [id number]  DATE:12/24/2006                            DOB:          1960-06-07    PHONE NUMBER:  [phone].   A patient of Dr. Reginia Naas.  Date of birth 12-04-1960, that is the  number I keyed in, [id number].  This is December 24, 2006, at 10:45 a.m.   This patient called complaining of persistent cough.  She stated she was  out of her Phenergan Expectorant with Codeine prescribed by Dr. Vassie Loll.  She notes that she was seen 3 to 4 weeks ago with a chronic cough.  She  has had recurrent problems this week and was out of work for several  days.  Because of this, she was taking her cough syrup twice a day  instead of the prescribed one at bedtime.  Because of this she ran out  of it too soon and the pharmacy would not refill it, even though Dr.  Vassie Loll had authorized a refill on the cough syrup.   DISPOSITION:  I called the [facility] at [phone].  I asked them  to go ahead and refill her Phenergan Expectorant with Codeine 150 mL,  one teaspoon at bedtime p.r.n. cough.  She knows to call Dr. Vassie Loll for a  followup appointment to re-evaluate her cough problem.     Lonzo Cloud. Kriste Basque, MD  Electronically Signed    SMN/MedQ  DD: 12/24/2006  DT: 12/25/2006  Job #: [id number]

## 2010-07-07 NOTE — Assessment & Plan Note (Signed)
[facility]                                 ON-CALL NOTE   NAME:[name] [name], [name] [name]                         MRN:          [id number]  DATE:09/12/2007                            DOB:          01/02/1961    [name] called tonight saying that she is having severe rectal pain.  She was seen by Dr. Jarold Motto today and was diagnosed with a thrombosed  hemorrhoid.  She has been taking warm soaks and ibuprofen at home as  well as some Vicodin.   I recommended that she continue the warm soaks and to take her Vicodin  every 6 hours as needed.  She will see Dr. Colin Benton in the immediate future  for further evaluation.     Barbette Hair. Arlyce Dice, MD,FACG  Electronically Signed    RDK/MedQ  DD: 09/12/2007  DT: 09/13/2007  Job #: [id number]   cc:   Vania Rea. Jarold Motto, MD, Caleen Essex, FAGA

## 2010-07-07 NOTE — Assessment & Plan Note (Signed)
[facility]                             PULMONARY OFFICE NOTE   NAME:Pecor, AZJAH PARDO                       MRN:          [id number]  DATE:08/04/2006                            DOB:          03-Jul-1960    PULMONARY EXTENDED SUMMARY FOLLOW-UP OFFICE VISIT:  A 50 year old white female who has been coughing for over 10 years with  a documented hiatal hernia with previous GERD by upper endoscopy in 2005  but most recent studies done in March 2008 including a manometry and 24-  hour pH probe showing no obvious cause and effect between her cough and  reflux.  She has noticed, however, that whenever she reduces her  medications for reflux she gets overt heartburn symptoms but not  necessarily any correlation with cough.   She is maintained on metoclopramide 10 mg q.h.s., Nasarel one b.i.d.,  and multiple psychotropics.   She continues to need Tylenol No. 3 interchanged with tramadol to  control her cough.   PHYSICAL EXAMINATION:  GENERAL:  She is a somber but not overtly  depressed, ambulatory white female in no acute distress.  VITAL SIGNS:  She has stable vital signs.  HEENT:  Unremarkable.  Oropharynx clear.  CHEST:  Lung fields perfectly clear bilaterally to auscultation and  percussion with no cough elicited on inspiratory or expiratory  maneuvers.  CARDIAC:  There is a regular rate and rhythm without murmur, gallop, or  rub.  ABDOMEN:  Soft, benign.  EXTREMITIES:  Warm without calf tenderness, cyanosis, clubbing or edema.   Sinus CT scan was reviewed from Jun 23, 2006, and is normal.   Methacholine challenge test performed the morning of the 10th was  positive at a dose of 4.0 but did not reproduce the cough or for that  matter any of her symptoms and the test was stopped at that point.   IMPRESSION:  The patient has a positive methacholine challenge test in  the setting of chronic cough and previously was felt to have reflux but  we could not  document in terms of cause and effect.  It may well be that  cough causes her heartburn symptoms and not the other way around.  That  is, the cough may be primarily due to airways disease that is cough-  variant asthma.   I recommended a trial of Qvar 40 mcg two puffs b.i.d. but doubt that  this is going to be effective based on the patient's previous track  record in terms of response to empiric therapy, especially since even  prednisone did not seem to affect the cough in the past.   Based on that issue, I have recommended that she be seen by a new cough  specialist, Dr. Cathi Roan, and have made an appointment for him to do the  follow-up.  Once she establishes with him, I  have told her that he will be responsible for refilling any cough  suppressants that might contain  narcotics as I do not think long-term this is a viable option and will  not refill them without office visit.  Charlaine Dalton. Sherene Sires, MD, [facility]  Electronically Signed    MBW/MedQ  DD: 08/04/2006  DT: 08/05/2006  Job #: [id number]

## 2010-07-07 NOTE — Assessment & Plan Note (Signed)
[facility]                             PULMONARY OFFICE NOTE   NAME:[name], [name] CELLUCCI                       MRN:          [id number]  DATE:10/18/2006                            DOB:          11-Oct-1960    [name] comes for followup today. She is a 50 year old with biopolar  disorder and a chronic cough since 13 with a work up that was summarized  in my last note. In the past she has not responded to an empiric trial  of inhaled steroids and antireflux therapy (with metoclopramide and  omeprazole); only narcotics seem to provide some relief.   Rather than reinvent the wheel with more testing, I have decided to  undertake therapeutic trials, Deconamine (combination of first  generation antihistaminic and decongestant) seemed to provide some  relief when taken as a 4 week course. She now feels that her cough has  resurfaced. She is also worried about some bipolar symptoms. She seems  to be under considerable stress after the demise of her father and  illness of her mother. Her sister is a Engineer, civil (consulting) and has advised her against  narcotics.   Her exam is unremarkable today.  While there is some cobblestoning of the posterior pharyngeal wall, I  did not detect postnasal drip.  Chest is clear.   IMPRESSION:  Again this is a longstanding chronic cough that has  responded somewhat to upper airway cough therapy, but after so many  years I do suspect a psychogenic element to this. I had a frank  discussion with her about chronic narcotic usage. Again my plan was to  fully suppress the cough hoping that after suppression we could just use  non narcotics for maintaining therapy. I will try to persist with this  plan for another 3 to 4 months.   RECOMMENDATIONS:  1. I have advised her that during the daytime (or when she is awake)      she can use non-narcotics such as Tessalon up to 300 mg up to 5 to      6 times a day if required and Delsym p.r.n. for  breakthrough      symptoms.  2. Codeine/promethazine cough syrup will be prescribed, 5 mL p.o.      nightly for 4 weeks with 1 refill, and after the cough is      suppressed she will try to stretch this out for 4 months, and I      will try to wean her off the narcotics.  3. I have once again explained to her that she would be herself      responsible for her daytime somnolence on a day to day basis. I      have clearly advised her against driving when sleepy and she has      evidenced understanding.     Oretha Milch, MD  Electronically Signed    RVA/MedQ  DD: 10/18/2006  DT: 10/19/2006  Job #: [id number]

## 2010-07-07 NOTE — Consult Note (Signed)
[name] [name], [name] [name] NO.:  [id number]   MEDICAL RECORD NO.:  [id number]          PATIENT TYPE:  EMS   LOCATION:  MAJO                         FACILITY:  [facility]   PHYSICIAN:  [name], MD DATE OF BIRTH:  Oct 26, 1960   DATE OF CONSULTATION:  06/13/2008  DATE OF DISCHARGE:                                 CONSULTATION   CONSULTING PHYSICIAN:  Emergency room.   CHIEF COMPLAINT:  Right index finger infection.   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  She states that approximately 2 weeks ago, she got a  splinter of wood in the finger and tried to remove that herself.  Subsequently, she left town and went to an Urgent Care Clinic in [place] when the finger began to worsen in terms of pain, swelling, and  erythema.  At the Urgent Care Clinic, they tried an incision and  drainage and a removal of a foreign body, which was unsuccessful.  Since  that time, she has been taking Augmentin and Bactrim intermittently and  she has been also taking Darvocet, which she thinks is making her  nauseated.  She has taken her pills only in conjunction with each other,  so she does not know, which one is the offending agent.  She states she  has tolerated Vicodin in the past.   PAST MEDICAL HISTORY:  Significant for bipolar disorder.   MEDICATIONS:  She is on Augmentin and Bactrim, which she takes  intermittently and lithium.   SOCIAL HISTORY:  Per HPI.  She does smoke 3-4 cigarettes per day, drinks  rarely, and denies IV drug abuse.  Her tetanus status up to date.  X-  rays of the right index finger reveals soft tissue swelling with no gas,  no radiopaque foreign body, and no fracture.   PHYSICAL EXAMINATION:  She is afebrile with normal vital signs. Focused  examination of the right upper extremity reveals normal sensation in the  radial, ulnar, and  median nerve distributions except the index finger  pad, which is insensate.  She has normal strength and range of motion of  all fingers except her index finger, which has limited flexion secondary  to pain.  She has 0/4 Kanavel signs for the right index finger.  She  does have a ecchymotic discoloration over the volar index finger pad and  this extends across the PIP joint.  Her nailbed is intact and without  evidence of paronychia.  Her index finger pad is pointing on the radial  side and is tense.   ASSESSMENT AND PLAN:  [name] [name] is a 50 year old right-hand-dominant  female with a right index finger felon secondary to a foreign  body/multiple incision and drainage attempts previously.   After verbal consent, an incision and drainage procedure was for  performed in the emergency room.  The patient was told that the risks  include but are not limited to bleeding, infection, stiffness, and  scarring of the right index finger and hand;  damage to the nearby  structures at the eventual loss of her index finger pad; and the  potential need for index finger partial or complete amputation.  She was  also told the risks include failure to diagnose and treat the infection  effectively and the need for future surgery.  After Darrin voiced  understanding of the risks, the index finger was anesthetized with a  digital block of 5 mL of 1% lidocaine plain.  The index finger was  scrubbed with Betadine and draped into a sterile field.  A mid axial  incision was made for 2 cm over the radial aspect of the index finger  pad.  Purulence was encountered under the pad and this was cultured and  sent to microbiology labeled index finger abscess.  Care was taken to  divide all the fibrous septa of the index finger pad with scissors.  Care was also taken not to violate the flexor tendon sheath.  The wound  was irrigated with 400 mL of normal saline and packed with quarter-inch  iodoform gauze.  A dry  sterile dressing and Coban wrap were placed on  the index finger and the wound was hemostatic at the end.   The patient is given instructions to continue taking her Augmentin and  Bactrim and to take them separately and to discontinue whichever one is  making her nauseated.  She will be given a prescription for Vicodin 1-2  tablets p.o. q.4 h. p.r.n. for pain.  She is not to drive while taking  Vicodin.  She may drive when she feels like she could take unhindered  emergency maneuvers behind the wheel and is no longer taking Vicodin.  She is going to pack her wound daily after soaking in warm soapy water  for 5 minutes.  She will remove the old packing and repack the index  finger wound.  She was instructed on how to do this when I packed it.  She is going to cover it with a dry sterile dressing and follow up with  me in approximately 10 days' time.      [name], MD  Electronically Signed     CF/MEDQ  D:  06/13/2008  T:  06/14/2008  Job:  [id number]

## 2010-07-07 NOTE — Op Note (Signed)
NAMESAMADHI, MAHURIN                ACCOUNT NO.:  [id number]   MEDICAL RECORD NO.:  [id number]          PATIENT TYPE:  AMB   LOCATION:  DSC                          FACILITY:  [facility]   PHYSICIAN:  Harvie Junior, M.D.   DATE OF BIRTH:  [birth year]   DATE OF PROCEDURE:  04/21/2007  DATE OF DISCHARGE:                               OPERATIVE REPORT   PREOPERATIVE DIAGNOSIS:  Medial meniscal tear with chondromalacia of the  patella.   POSTOPERATIVE DIAGNOSIS:  1. Chondromalacia of the patella.  2. Tight lateral retinaculum.  3. Medial shelf plica.   OPERATION/PROCEDURE:  1. Debridement of chondromalacia on the undersurface of the patella.  2. Lateral retinacular release, arthroscopic.  3. Debridement of medial shelf plica.   SURGEON:  Harvie Junior, M.D.   ASSISTANT:  Marshia Ly, P.A.   ANESTHESIA:  General.   BRIEF HISTORY:  Mrs. Rothman is a 50 year old female with long history of  having had left knee pain and problems.  She had undergone a knee  arthroscopy which showed she had some chondromalacia of the medial  femoral condyle, chondromalacia of the patella. We did a lateral  retinacular release on her.  She also has a chondromalacia and lateral  tracking.  She did great with that and felt that she was having similar  or worse pain on the right side than on the left side.  We ultimately  discussed treatment options and we felt that arthroscopic intervention  was the most appropriate and she was taken to the operating room for  that procedure.   DESCRIPTION OF PROCEDURE:  The patient was brought to the operating  room.  After adequate anesthesia was obtained with general anesthetic,  the patient was placed supine on the operating table.  The left shoulder  was prepped and draped in the usual sterile fashion. The right leg was  prepped and draped in the usual sterile fashion.  Following this,  routine arthroscopic examination of the knee revealed it was stable in  all  directions.  At that point the right knee was prepped and draped in  the usual sterile fashion.  Following routine arthroscopic examination,  the knee revealed that there was no chondromalacia on the medial femoral  condyle and that the medial meniscus was within normal limits.  There  was a this large medial shelf plica blocking entrance into the medial  compartment. This was debrided with suction shaver back to the stable  rim of the joint capsule.  The ACL was normal.  Lateral side normal.   Attention was turned back to the femoral joint where there was severe,  soft cartilage and chondromalacia, grade 2 and 3 on the lateral patella  facet.  This was debrided with suction shaver to a smooth and stable  rim.  Attention was turned to the patella tracking which was lateral.  There was a tight lateral retinaculum.  Ultimately did an arthroscopic  lateral retinacular release from 3 fingerbreadths proximal to the  patella down to the joint line.  Once  this was completed, the knee was scoped and thoroughly  irrigated and  suctioned dry.  The arthroscopic portals were closed with a bandage.  Sterile compressive dressing was applied.  The patient was taken to the  recovery room and she was noted to be in satisfactory condition.  Estimated blood loss for procedure was none.      Harvie Junior, M.D.  Electronically Signed     JLG/MEDQ  D:  04/21/2007  T:  04/22/2007  Job:  [id number]

## 2010-07-07 NOTE — Discharge Summary (Signed)
NAMEJACKIE, RUSSMAN                ACCOUNT NO.:  [id number]   MEDICAL RECORD NO.:  [id number]          PATIENT TYPE:  IPS   LOCATION:  0306                          [facility]ACILITY[facility]  [facility]   PHYSICIAN:  Jasmine Pang, M.D. DATE OF BIRTH:  1960/12/04   DATE OF ADMISSION:  10/03/2008  DATE OF DISCHARGE:  10/07/2008                               DISCHARGE SUMMARY   IDENTIFYING INFORMATION:  This is a 50 year old single white female, who  was admitted on a voluntary basis on October 03, 2008.   HISTORY OF PRESENT ILLNESS:  This is the first [facility] admission for this 35-  year-old with a history of relapsing on alcohol back in December 2009  with some intermittent new use.  She has been drinking daily since April  2010.  Drinking is escalated up to 8 to 9 mini bottles of liquor per  day.  Prior to that, she had 7-1/2 years of abstinence, which she  attributes to strong spiritual face.  She has also been active from time  to time in [place].  In April of this year, she had an injury to her hand  with infection to her right index finger, which after lengthy therapy  resulted in amputation.  Also, during that time due to her medical  issues, she lost her job.  These 2 major stressors contributed to her  progressive alcohol use.  She requested detox and assistance in making a  relapse prevention plan.  She denies suicidal thoughts.  While receiving  medical treatment during April and May and losing her job, she  subsequently lost her health insurance and cut back on her Lamictal and  Abilify doses to every other day in order to save money.  Recently, she  started on Klonopin within 3 weeks to control some anxiety.  She denies  regular use of benzodiazepines.  She is followed as an outpatient by Dr.  Ezzard Flax at the [facility].  The is inpatient admission for detox.  [facility] a [phone]  [facility] treatment and was treated at [facility] for detox in 1999.  She has  a history of bipolar disorder and is generally compliant with  medications until recently.  No prior suicide attempts.  No history of  medication use to prevent alcohol relapse.  No history of disulfiram,  naltrexone, or campral.  For further admission information, see  psychiatric admission assessment.  Upon admission, she was given another  diagnosis of alcohol abuse and dependence.  She was also given a  diagnosis of bipolar disorder, not otherwise specified.  On axis III,  she was given the diagnosis of gastroesophageal reflux disease and  recent right finger amputation secondary to infection.   PHYSICAL FINDINGS:  This was done in the emergency room.  There were no  acute physical or medical problems noted.   Diagnostic studies were remarkable for an alcohol level of 137.  Chemistries were within normal limits.  Urine drug screen positive for  opiates and negative for all other substances.  Routine urinalysis was  unremarkable.  CBC was normal with platelets of 233,000, MCV of 93.5,  hemoglobin of 13.2, and hematocrit of 38.3.   HOSPITAL COURSE:  Initially, the patient was restarted on iron  medications of promethazine 25 mg one half to one tablet every 8 hours  p.r.n. nausea, pantoprazole 40 mg daily, HyoMax-SL 0.125 mg sublingual 1  tablet p.r.n., Abilify 15 mg p.o. q.h.s., lamotrigine 200 mg p.o. now,  Align 1 tablet daily.  She was also started on Librium detox protocol.  In individual sessions, the patient presented as a neatly and casually  dressed female with good eye contact.  Speech was normal rate and flow.  Psychomotor activity was within normal limits.  Mood was depressed and  anxious.  Affect consistent with mood.  She denied suicidal ideation  with no evidence of a thought disorder or psychosis.  She was  complaining of back pain and was started on tramadol 40 mg p.o. t.i.d.  knee pain.  She continued to be anxious and was started on Neurontin 100  mg p.o. q.4 h.  p.r.n. anxiety.  The patient worried about not being on  her Klonopin due to her anxiety, but was advised that the Neurontin  should be helpful with this and was not addictive.  She was tolerating  the detox protocol well.  On October 06, 2008, she was still anxious, but  less depressed.  Her sleep was good and appetite was good.  She  discussed the support from her partner.  Mother was very supportive.  She talked about how she is going to work her recovery program.  She was  having no side effects from her medications.  On October 07, 2008, mental  status had improved from admission status.  Mood was euthymic.  Affect  was consistent with mood.  There was no suicidal or homicidal ideation.  No thoughts of self injurious behavior.  No auditory or visual  hallucinations.  No paranoia or delusions.  Thoughts were logical and  goal-directed.  Thought content, no predominant theme.  Cognitive was  grossly intact.  Insight good.  Judgment good.  Impulse control good.  The patient tolerated her detox protocol well without symptoms of  withdrawal.  She was having no side effects to her medications.  She  wanted to go home today and was felt to be safe for discharge.   DISCHARGE DIAGNOSES:  Axis I:  Bipolar disorder, not otherwise  specified.  Alcohol dependence with recent relapse after 8 years of  sobriety.  Axis II: None.  Axis III:  1.  Gastroesophageal reflux disease.  2.  Recent right finger  amputation secondary to infection.  Axis IV:  Severe (medical issues now resolving, occupational economic  pressures, burden of psychiatric and chemical dependence issues).  Axis V:  Global assessment of functioning was 55 upon discharge.  GAF  was 46 upon admission.  GAF highest past year was 70 to 75.   DISCHARGE PLAN:  There were no specific activity level or dietary  restrictions.   POSTHOSPITAL CARE PLANS:  The patient   Dictation ended at this point.      Jasmine Pang, M.D.   Electronically Signed     BHS/MEDQ  D:  10/07/2008  T:  10/08/2008  Job:  [id number]

## 2010-07-07 NOTE — Assessment & Plan Note (Signed)
[facility]                             PULMONARY OFFICE NOTE   NAME:[name] [name], [name] [name]                       MRN:          [id number]  DATE:07/06/2006                            DOB:          15-Sep-1960    HISTORY:  A 50 year old white female with cough dating back over 10  years, and failing to respond in a convincing fashion to treatment  directed at asthma (with prednisone), rhinitis (with prednisone and  first generation antihistamines in the form of Bromfed), and reflux (in  the form of PPI plus Reglan therapy with a negative recent GI workup).   I am convinced more than ever now that the cough is functional in  nature.  The patient has a hopeless helpless affect and attitude about  the cough, stating that nothing makes it better, nothing makes it  worse.  Could this be an allergy was her main concern.   PHYSICAL EXAMINATION:  She is a depressed-appearing, ambulatory, white  female, in no acute distress.  She had stable vital signs.  HEENT:  Unremarkable.  Oropharynx clear.  NECK:  Supple without cervical adenopathy or tenderness.  Trachea is  midline.  No thyromegaly.  LUNG FIELDS:  Perfectly clear to auscultation and percussion  bilaterally.  No cough elicited on inspiratory or expiratory maneuvers.  HEART:  Regular rate and rhythm without murmur, gallop, or rub.  ABDOMEN:  Soft and benign.  EXTREMITIES:  Warm without calf tenderness, cyanosis, clubbing, or  edema.   IMPRESSION:  Chronic cough, probably functional in nature dating back  over 10 years.  It may have cyclical features that, regardless to  finding a cause of the cough, will not be eliminated because she coughs  so much, she coughs from either secondary reflux or secondary airway  trauma.  I have recommended a combination of Tylenol 3 and tramadol, which she  says works better than anything else, only to relapse as soon as she  reduces it.   I have also recommended we go ahead  with a methacholine challenge test,  which is the only step in the algorhythm, other than allergy testing,  but has not been done.   In terms of allergy testing, she has no noted no difference between  seasons nor environments, nor response to prednisone or 1st generation  antihistamines.  This was strongly argued against either asthma,  allergies, or postnasal drip known as any cause as being a major  mechanism driving the cough.   Before considering allergy testing, I would like her seen by our new  partner, who has expertise in cough, and I will arrange for this  referral once he is established here.     Charlaine Dalton. Sherene Sires, MD, [facility]  Electronically Signed    MBW/MedQ  DD: 07/06/2006  DT: 07/06/2006  Job #: [id number]   cc:   Tamera Punt, PA

## 2010-07-07 NOTE — Op Note (Signed)
NAMEPROVIDENCE, STIVERS                ACCOUNT NO.:  [id number]   MEDICAL RECORD NO.:  [id number]          PATIENT TYPE:  AMB   LOCATION:  DSC                          FACILITY:  [facility]   PHYSICIAN:  Harvie Junior, M.D.   DATE OF BIRTH:  03/12/1960   DATE OF PROCEDURE:  03/03/2007  DATE OF DISCHARGE:                               OPERATIVE REPORT   PREOPERATIVE DIAGNOSIS:  Chondromalacia of patella with suspected tight  lateral retinaculum.   POSTOPERATIVE DIAGNOSIS:  1. Chondromalacia of patella with suspected tight lateral retinaculum.  2. Chondromalacia of medial femoral condyle.   PROCEDURE:  1. Chondroplasty of patellofemoral joint, in particular the medial      patellar facet.  2. Chondroplasty of the medial femoral condyle.  3. Lateral retinacular release, arthroscopic.   SURGEON:  Harvie Junior, M.D.   ASSISTANT:  Marshia Ly, P.A.-C.   ANESTHESIA:  General.   BRIEF HISTORY:  Ms. Dutko is a 50 year old female with a long history  of having had significant left knee pain.  We treated her conservatively  for a prolonged period of time with anti-inflammatory medication and  activity modification.  Because of continued complaints of pain, she was  ultimately taken to the operating room for operative knee arthroscopy.   DESCRIPTION OF PROCEDURE:  The patient was taken to the operating room  and after adequate anesthesia was obtained with general anesthesia, the  patient was placed on the operating table, the left leg was prepped and  draped in the usual sterile fashion.  Following this, routine  arthroscopic examination of knee revealed there was obvious lateral  patellar tracking with chondromalacia of the patella.  At this point,  the patella was debrided thoroughly back to a smooth and stable rim of  articular cartilage and attention was turned to the medial compartment.  The medial meniscus was within normal limits, it was probed at length.  The medial femoral  condyle had some grade 2 chondromalacia which probed  and was a very soft, flapping cartilage.  At this point, this was  thoroughly debrided back to a smooth and stable rim of cartilage.  At  this point, attention was turned to the ACL which was normal, lateral  side normal.  Attention was turned back to the patellofemoral joint  where there was noted to be a tight lateral retinaculum.  At this point,  a lateral retinacular release was undertaken from about 3 fingerbreadths  proximal to the patella down to the lateral joint line.  Once this was  completed, the knee was copiously and thoroughly irrigated and  suctioned dry.  The arthroscopic portals were closed with a bandage.  A  sterile compressive dressing was applied.  The patient was taken to the  recovery room and was noted to be in satisfactory condition.  Estimated  blood loss for the procedure was nothing.      Harvie Junior, M.D.  Electronically Signed     JLG/MEDQ  D:  03/03/2007  T:  03/03/2007  Job:  [id number]

## 2010-07-07 NOTE — Assessment & Plan Note (Signed)
[facility]                             PULMONARY OFFICE NOTE   NAME:Hoston, CRYSTALYNN MCINERNEY                       MRN:          [id number]  DATE:08/31/2006                            DOB:          10/17/1960    HISTORY OF PRESENT ILLNESS:  Ms. Guizar is a 50 year old white woman  with bipolar disorder and a chronic cough since 1996.   To summarize her workup so far:  1. Pulmonary function tests showed normal spirometry and a positive      methacholine challenge at a dose of 4 mg.  She has not responded to      QVAR, but has noted some benefit with prednisone.  2. Although CT of her sinuses have been normal, she does report      frequent tickle in her throat and postnasal drip, and responds to      Bromfed.  3. Hiatal hernia has been documented, and upper endoscopy in 2005 had      suggested GERD.  Esophageal manometry has been inconclusive and a      24-hour pH probe showed no obvious cause and effect between the      cough and reflux, although she did have 8 reflux episodes.  She had      a total of 9 reflux episodes and a pH less than 4 in the proximal      channel (this was less than 1 minute).  She has been maintained on      metoclopramide and omeprazole.  4. Psychogenic cough has been considered.  I do note that her bipolar      symptoms have been well controlled over the past few years.  5. Tylenol #3 was started for back pain and this seemed to suppress      her cough.  She now uses this on an intermittent basis, alternating      with tramadol to control her cough.   REVIEW OF SYSTEMS:  Describes a postnasal drip.  Her roommate has  occasionally noted that she stops breathing in her sleep; however, there  is no history of loud snoring or excessive daytime somnolence.   PAST SURGICAL HISTORY:  Hysterectomy.   ALLERGIES:  None.   SOCIAL HISTORY:  Although she states that she has quit smoking, she does  smoke occasionally when she meets her  brother.  She is single, lives  with her roommate, works at [employer], works every third night.   PHYSICAL EXAMINATION:  Essentially shows a wide pharyngeal space and a  postnasal drip.  CARDIOVASCULAR:  S1, S2 normal.  CHEST:  Clear to auscultation.  Oxygen saturation 98% on room air.  Weight 199 pounds.   CURRENT MEDICATIONS:  1. Abilify.  2. Lamictal.  3. Prozac.  4. Omeprazole.  5. Metoclopramide.  6. Bromfed.  7. Ultram.  8. Delsym.  9. Tylenol #3 p.r.n.   IMPRESSION AND PLAN:  Chronic in this 50 year old nonsmoker with studies  as outlined above.  Although she had a positive methacholine challenge,  I doubt that this is cough-variant asthma.  Upper airway cough  syndrome  (postnasal drip) seems most likely.  It is very probable that after so  many years of her chronic ailment, this has transitioned into a  psychogenic cough.   We will try a first generation antihistamine and decongestant  combination such as Deconamine SR 1 tablet p.o. b.i.d. for 4-6 weeks to  suppress her sinuses.  She will continue to take the metoclopramide and  omeprazole.  Hopefully, with this regimen, we should be able to suppress  her cough for about 4 weeks.  I have also asked her to go ahead and take  the Tylenol #3 on a nocturnal basis to break the cycle, and, hopefully,  take care of the psychogenic component, if any.  We will reevaluate her  in about 4-6 weeks' time.   I had a frank discussion with her about realistic goals for her therapy,  and explained that long-term use of narcotics would likely do her more  harm than the cough itself.     Oretha Milch, MD  Electronically Signed    RVA/MedQ  DD: 08/31/2006  DT: 09/01/2006  Job #: [id number]

## 2010-07-07 NOTE — Op Note (Signed)
[name] [name], [name] [name]                ACCOUNT NO.:  [id number]   MEDICAL RECORD NO.:  [id number]          PATIENT TYPE:  AMB   LOCATION:  DAY                          FACILITY:  [facility]   PHYSICIAN:  Alfonse Ras, MD   DATE OF BIRTH:  1961-01-11   DATE OF PROCEDURE:  DATE OF DISCHARGE:                               OPERATIVE REPORT   PREOPERATIVE DIAGNOSIS:  Grade II and III internal hemorrhoids.   POSTOPERATIVE DIAGNOSIS:  Grade II and III internal hemorrhoids.   PROCEDURE:  Prolapsing hemorrhoids, rectopexy.   SURGEON:  Alfonse Ras, M.D.   ANESTHESIA:  General.   DESCRIPTION:  The patient was taken to the operating room and placed in  supine position.  After adequate general anesthesia was induced using an  endotracheal tube, the patient was placed in the prone jack-knife  position.  Rectal dilatation was accomplished to 3 fingerbreadths.  The  buttocks were taped apart and the rectal and perianal prep were  undertaken.  The vagina was also prepped.  The hemorrhoidal bundles were  injected with 0.5 Marcaine with Wydase and internal and external  sphincter muscles were injected with 0.5 Marcaine.  A 2-0 Prolene  pursestring suture was placed 5 cm proximal to the dentate line in the  mucosa and submucosa circumferentially.  The stapler was introduced and  was closed and the vagina was checked, and had no involvement with the  stapler.  The stapler was then fired and removed.  There was a good full  doughnut of hemorrhoidal tissue with no evidence of muscularis.  The  staple line was inspected.  There was no bleeding.  Gelfoam packing was  placed.  Sterile dressing was applied.  The patient tolerated the  procedure well and went to PACU in good condition.      Alfonse Ras, MD  Electronically Signed     KRE/MEDQ  D:  11/17/2007  T:  11/17/2007  Job:  [id number]

## 2010-07-07 NOTE — Assessment & Plan Note (Signed)
[facility]                             PULMONARY OFFICE NOTE   NAME:[name], [name] BARI                       MRN:          [id number]  DATE:12/06/2006                            DOB:          [birth year]    [name] returns for a followup of her chronic cough.  Unfortunately,  we have not been able to impact this much with a therapeutic trial of  first-generation antihistamine and decongestants.  Earlier, she has also  tried inhaled steroids and antireflux therapy (with metoclopramide and  omeprazole) without relief.  Her extensive workup has been summarized on  my earlier reports.   Her cough does seem to be worse at night and when she lies down.  She  does not seem to have a long-hanging uvula but this does make me suspect  post nasal drip.  However, after so many years of having this cough, it  is very likely she has a psychogenic component.  Only narcotics have  provided some relief.  Although I have had detailed discussions with her  about the long-term dangers of narcotics, it seems to me that it would  be better for her to take low-dose narcotics from me rather than for her  to go doctor shopping and get it from various other providers.   We discussed this some more and decided to go with Tessalon Perles 300  mg up to six times a day if required, and Delsym p.r.n. for breakthrough  symptoms.  At night, she will take codeine/promethazine p.o. q.h.s. and  I provided her with two refills.  I will try to see her every 3 months  and provide her with the refills for this.  She will also use nasal  steroids in each naris at bedtime.  I have once again cautioned her that  she would be responsible for her daytime somnolence on a day-to-day  basis.  She has been advised against driving when sleepy and agrees to  take responsibility for this.     Oretha Milch, MD  Electronically Signed    RVA/MedQ  DD: 12/06/2006  DT: 12/07/2006  Job #: [id number]   cc:   Vania Rea. Jarold Motto, MD, Caleen Essex, FAGA  Ezzard Flax

## 2010-07-07 NOTE — Assessment & Plan Note (Signed)
[facility]                             PULMONARY OFFICE NOTE   NAME:[name] [name] STARKEL                       MRN:          [id number]  DATE:01/23/2007                            DOB:          08-03-1960    HISTORY OF PRESENT ILLNESS:  Patient is a very pleasant 50 year old  female patient of Dr. Reginia Naas, has a known history of bipolar disorder  and cyclical cough.  Patient complains that she has had a cough over the  last 10 years.  However, over the last year, cough has worsened.  Patient has had multiple evaluations and has been placed on multiple  cough suppression regimens.  Most recently, patient was placed on  Tessalon Perles, Delsym and Phenergan/codeine cough syrup.  Patient has  been placed on reflux prevention with Reglan and PPI therapy along with  antihistamines.  Patient reports that various treatments have helped in  the past along with prednisone tapers as well.  However, symptoms never  totally resolved and continue to return.  Presently, patient is not  taking her Reglan on a regular basis nor is she on PPI therapy.  Patient  has been using Phenergan VC with Codeine at bedtime and intermittent  Delsym.  Patient does admit that she does occasionally smoke a  cigarettes once a week.  Patient denies any hemoptysis, orthopnea, PND,  leg swelling.  Patient does complain that she constantly feels as if she  has a tickle in her throat with constant drainage and intermittent  reflux at times.   PAST MEDICAL HISTORY:  Reviewed.   CURRENT MEDICATIONS:  Reviewed.   PHYSICAL EXAMINATION:  VITAL SIGNS:  She is afebrile with stable vital  signs.  O2 saturation is 96% on room air.  GENERAL APPEARANCE:  Patient is a pleasant female in no acute distress.  HEENT:  Nasal mucosa is erythematous.  Nontender sinuses.  Conjunctivae  not injected.  Posterior pharynx with some mild erythema, no exudate  noted.  NECK:  Supple without adenopathy.  No JVD.  LUNGS:  Coarse breath sounds without any wheezing or crackles.  Patient  does have a fine upper airway pseudowheeze.  CARDIOVASCULAR:  Regular rate.  ABDOMEN:  Soft and nontender.  EXTREMITIES:  Warm without any calf tenderness, clubbing, cyanosis, or  edema.  NEUROLOGIC:  Alert and oriented x3.  To note, patient does have a  constant throat clearing throughout the entire exam.   IMPRESSION AND PLAN:  Chronic cough.  Patient has been recommended to  restart her aggressive cough suppression regimen, targeting rhinitis and  reflux prevention.  Patient will restart Prilosec 20 mg daily before  breakfast.  Restart Reglan at 5 mg before breakfast and dinner and one  at bedtime.  She will add in Chlor-Trimeton over-the-counter every four  hours for post nasal drip symptoms.  She may use Delsym 2 teaspoon twice  daily with Tessalon Perles and Tramadol 15 mg every four hours for  severe coughing.  Patient is advised on voice rest and  using non-mint lozenges to avoid throat clearing.  Patient will return  back here in  two weeks with Dr. Vassie Loll as scheduled or sooner if needed.      Rubye Oaks, NP  Electronically Signed      Oretha Milch, MD  Electronically Signed   TP/MedQ  DD: 01/24/2007  DT: 01/24/2007  Job #: [id number]

## 2010-07-10 NOTE — Assessment & Plan Note (Signed)
[facility]                                 ON-CALL NOTE   NAME:[name] [name], [name] [name]                         MRN:          [id number]  DATE:05/09/2006                            DOB:          1961/02/20    The patient called complaining of throat burning.  She has a history of  gastroesophageal reflux disease, which she takes Protonix.  ___She  developed chest_______  burning today despite taking Protonix and  antacids.  She was instructed to continue her antacids and to increase  her Protonix twice a day.  She scheduled a appointment to see Dr.  Jarold Motto.     Barbette Hair. Arlyce Dice, MD,FACG  Electronically Signed    RDK/MedQ  DD: 05/09/2006  DT: 05/09/2006  Job #: [id number]   cc:   Vania Rea. Jarold Motto, MD, Caleen Essex, FAGA

## 2010-07-10 NOTE — Assessment & Plan Note (Signed)
[facility]                                   ON-CALL NOTE   NAME:MILLERLetia, [name] [name]                         MRN:          [id number]  DATE:11/25/2005                            DOB:          [birth year]    This is a patient of Dr. Maryfrances Bunnell.   [name] [name] called tonight.  She had seen Dr. Cheryln Manly for chronic  cough recently.  There is a question of whether this is GERD-induced.  She  has had this cough for many years.  Dr. Jarold Motto recently put her on  Protonix twice daily, which she is taking 20 or 30 minutes prior to  breakfast and dinner.  She also has a GERD diet, and has made some lifestyle  modifications over the past couple of weeks that have been known to help  with GERD symptoms.  She has noticed no difference in her symptoms.  She was  scheduled to have esophageal manometry and possibly pH probe yesterday, but  she was not able to make it because of a death in the family.  She is  calling tonight for some type of symptomatic relief for her cough.   I explained that if her cough is indeed from GERD, then it could take up to  several weeks for the Protonix to really take effect.  I mentioned over-the-  counter remedies such as Robitussin, and she says these have not been  helpful in the past.  She was interested in stronger cough medicines to help  get her through the next several days, while she is dealing with this death  in the family.  I do not feel comfortable calling her in a cough syrup with  narcotics, as that does seem out of the range of usual GERD treatment.  She  will get in touch with Dr. Jarold Motto to rearrange her manometry workup.            ______________________________  Rachael Fee, MD      DPJ/MedQ  DD:  11/25/2005  DT:  11/27/2005  Job #:  [id number]   cc:   Vania Rea. Jarold Motto, MD, Clementeen Graham, [place]

## 2010-07-10 NOTE — Assessment & Plan Note (Signed)
[facility]                               PULMONARY OFFICE NOTE   NAME:[name] [name], [name] [name]                         MRN:          [id number]  DATE:10/26/2005                            DOB:          1960-11-24    HISTORY:  A 50 year old white female, remote smoker, who says she has been  coughing for over 10 years, and the only thing that helps is narcotics.  The record indicates that she does have a hiatal hernia with GERD documented  in January 2005, and also evidence of mild left frontal sinusitis in March  of 2007; however, treatment directed at both rhinitis, sinusitis and reflux  have done nothing for her per patient's statement today.  She has a  constant sensation that something is stuck in her throat (she points to  her suprasternal notch), and clears her throat frequently during the exam.  However, she does not have any significant morning exacerbation of excess  sputum production, fevers, chills, sweats, leg swelling or dyspnea.   MEDICATIONS:  For a full list of medications, please see face sheet dated  October 26, 2005.   PHYSICAL EXAMINATION:  GENERAL:  She is a somewhat depressed-appearing  ambulatory white female in no acute distress.  VITAL SIGNS:  Stable.  HEENT:  Significant for frequent throat clearing; however, I only see  minimal cobblestoning.  No excessive mucus production.  Nasal turbinates  normal.  Ear canals clear bilaterally.  NECK:  Supple without cervical adenopathy or tenderness.  Trachea is  midline.  No thyromegaly.  LUNGS:  Lung fields perfectly clear bilaterally to auscultation and  percussion.  HEART:  Regular rate and rhythm without murmur, rub, or gallop.  ABDOMEN:  Soft, benign.  EXTREMITIES:  Warm without calf tenderness, cyanosis, clubbing or edema.   Oxygen saturation 99% on room air.   IMPRESSION:  Chronic cough dating back over 10 years.  It probably is  functional in nature and not directly related to  either reflux or sinusitis,  both of which have been documented in the past.  The patient is convinced  that something is stuck in her throat and studies of long-term coughers  sometimes indicates a therapeutic benefit once they can be convinced that  there is really nothing causing the cough.  Since we have already tried all  of the other options to control the cough and found that the only thing that  works is narcotics, I think it would be reasonable to proceed with  bronchoscopy for a quick look at the airways to exclude endobronchial  process, and also at the same time rule out eosinophilic bronchitis by  lavage.   I discussed the risks, benefits and the various alternatives with the  patient in detail today in the office, and she agreed to proceed with  bronchoscopy, tentatively scheduled for October 28, 2005.                                   Charlaine Dalton. Sherene Sires, MD, FCCP  MBW/MedQ  DD:  10/26/2005  DT:  10/26/2005  Job #:  [id number]

## 2010-07-10 NOTE — Assessment & Plan Note (Signed)
[facility]                               PULMONARY OFFICE NOTE   NAME:[name], [name]                         MRN:          [id number]  DATE:10/07/2005                            DOB:          05-07-1960    HISTORY:  A 50 year old white female with cough dating back over 10 years  and documented reflux by upper endoscopy in 2005 who was last seen here on  May 1 with mostly nocturnal cough that I presumed was reflux related.  She  had just stopped smoking and has actually been able to maintain off of  cigarettes and says she took the Zegerid as prescribed but it did not help  at all.  She says the only thing that helps the cough is Ultram.  Note, my  instructions were to use Mucinex DM and only supplement with Ultram, but it  turns out she cannot afford the Mucinex but can afford the Ultram and  continues to use it to suppress the excess coughing.  She denies any excess  sputum production, fevers, chills or sweats, but continues to have nocturnal  coughing.  Also reports coughing with exercise but no significant dyspnea.  She does report improvement transiently from prednisone previously.  She is  being treated actively for rhinitis with Nasacort two puffs b.i.d.  For a  full inventory of all other medicines, please see face sheet dated October 07, 2005.   PHYSICAL EXAMINATION:  She is an ambulatory white female with an unusual  affect but in no acute distress.  She has stable vital signs.  HEENT:  Unremarkable.  Pharynx clear.  There is no evidence of excessive  postnasal drip.  Nasal turbinates normal.  Ear canals clear bilaterally.  NECK:  Supple without cervical adenopathy or tenderness.  LUNG FIELDS:  Perfectly clear bilaterally to auscultation and percussion  with no cough elicited on inspiratory or expiratory maneuvers.  HEART:  Regular rhythm without murmur, gallop or rub.  ABDOMEN:  Soft, benign.  EXTREMITIES:  Normal without calf  tenderness, cyanosis, clubbing or edema.   Chart review indicates the last sinus CT scan showed a very small air-fluid  level in the left frontal sinus dated May 21, 2005.  Chest x-ray has not  been done since 2004 and therefore was ordered.   IMPRESSION:  Chronic cough dating back over ten years in a patient with  documented reflux but who was actively smoking until April 2007.  She has  maintained off of cigarettes but has seen no further improvement in her  cough (note she did report previous improvement but not elimination) with  the addition of Zegerid at bedtime.  The other issue is that she is having  trouble affording her medicines.   I spent extra time talking to the patient about the differential diagnosis  of cough and how cough induces reflux, and because she has documented reflux  will need to continue to be treated for reflux while coughing.  I therefore  gave her Zegerid 40 mg nightly and offered a short course of prednisone to  see if again there is a significant improvement on prednisone that relapses  off.  If so, a diagnosis of either rhinitis with postnasal drip syndrome or  asthma cough would be the most likely sources of cough.  Since she is  already on nasal steroids, I might consider adding an inhaled steroid,  although note that the patient is having trouble affording the medicines she  is on and the cost of inhaled steroids may be prohibitive (samples of course  will be given if this is the case).   Of note also, this patient has a history of ulcerative proctitis, which  could affect the airways, and also has a history of manic depressive  disorder which could lead to a functional aspect of the cough which is an  issue in a patient who has been coughing for over ten years.   I discussed all these issues openly with the patient today and encouraged  her to return every four weeks until we see elimination especially of the  nocturnal cough since it is waking  her from her sleep and is not likely  therefore to have a functional component.                                   Charlaine Dalton. Sherene Sires, MD, [facility]   MBW/MedQ  DD:  10/07/2005  DT:  10/07/2005  Job #:  [id number]

## 2010-07-10 NOTE — Assessment & Plan Note (Signed)
[facility]                             PULMONARY OFFICE NOTE   NAME:[name] [name], [name] [name]                         MRN:          [id number]  DATE:03/23/2006                            DOB:          11/12/1960    HISTORY:  This is a 50 year old white female with chronic cough dating  back over 10 years felt to be due to reflux and undergoing a  gastrointestinal evaluation.  She is here today stating that she is  having to use Afrin for nasal congestion with mild slightly discolored  sputum in the morning for the last several days, but no fever, chills,  sweats or increase in cough.   For a full list of her medication, please see progress note dated  March 23, 2006, correct as listed.   PHYSICAL EXAMINATION:  She is a somewhat depressed appearing,  ambulatory, white female in no acute distress.  VITAL SIGNS:  Afebrile, normal vital signs.  HEENT:  Mild to moderate non-specific turbinate edema with no apparent  secretions.  Oropharynx is clear with no cobblestoning or erythema.  NECK:  Supple without cervical adenopathy or tenderness.  Trachea is  midline without organomegaly.  LUNGS:  Perfectly clear bilaterally on auscultation and percussion with  no cough elicited on inspiratory or expiratory maneuvers.  HEART:  Regular rhythm without murmur, gallop or rub.  ABDOMEN:  Soft, benign.  EXTREMITIES:  Warm without calf tenderness, cyanosis or clubbing.   IMPRESSION:  1. Chronic cyclical cough at least partially on the basis of reflux.      I have given her Tylenol #3 #120 with no refills since this is the      only thing that works for the cough.  2. She does have mild rhinitis.   PLAN:  I recommended Nasacort samples but did remind her about  appropriate use of Afrin in that it should only be used immediately  before the Nasacort and for no more than 5 days.     Charlaine Dalton. Sherene Sires, MD, [facility]  Electronically Signed    MBW/MedQ  DD: 03/23/2006  DT:  03/23/2006  Job #: [id number]

## 2010-07-10 NOTE — Assessment & Plan Note (Signed)
[facility]                                   ON-CALL NOTE   NAME:Heuring, AILEN                         MRN:          [id number]  DATE:12/16/2005                            DOB:          May 18, 1960    DATE OF CALL:  December 16, 2005.   PROBLEM:  This pulmonary patient of Dr. Sherene Sires, called at 6:25pm. She  indicated that she had an appointment for the following day with Dr. Sherene Sires in  the morning, but had a bothersome, minimally productive cough. She was  afraid might not sleep well, and would have a difficult day at work before  the appointment. She asked for cough syrup. I have recommended that she use  Delsym, which she had at home, and asked that she wait until the next  morning to speak with Dr. Sherene Sires, for more specific management. She agreed.     Clinton D. Maple Hudson, MD, FCCP, FACP    CDY/MedQ  DD: 12/17/2005  DT: 12/19/2005  Job #: [id number]   cc:   Charlaine Dalton. Sherene Sires, MD, FCCP

## 2010-07-10 NOTE — Assessment & Plan Note (Signed)
[facility]                             PULMONARY OFFICE NOTE   NAME:[name] [name], [name] [name]                       MRN:          [id number]  DATE:06/10/2006                            DOB:          1960-03-27    HISTORY:  A 50 year old white female with cough dating back to 1996 with  a history of previous sinusitis in 2007, but strongly suspected GERD  based on perceived response to PPI therapy.  Her recent workup showed no  reflux by either monometry or 24 hours pH probe, but she has found on  her own that whenever she stops Zegerid, it is worse.  I empirically  added Reglan to her regimen, but she states the reflux is better, but  the sensation of drainage is worse.  Because of dizziness, she could  not take the Bromfed, as I recommended, for more than a couple days, and  even while she took it did not seem to benefit.  The only thing she says  that ever controlled the cough, in retrospect, to her satisfaction, is  use of either narcotics or Ultram.   For full inventory of medications, please see face sheet dated June 10, 2006.   PHYSICAL EXAMINATION:  She is a somber, but not overtly depressed  ambulatory white female in no acute distress.  Somewhat of a la belle  indifference affect and attitude.  VITAL SIGNS:  Stable vital signs.  HEENT:  Unremarkable.  Oropharynx is clear.  There is no evidence of  excessive post-nasal drainage.  Nasal turbinates are normal.  NECK:  Supple without cervical adenopathy or tenderness. Trachea is  midline.  No thyromegaly.  LUNGS:  Fields perfectly clear bilaterally to auscultation and  percussion with no cough elicited on inspiratory or expiratory  maneuvers.  CARDIAC:  Regular rate and rhythm without murmur, gallop, or rub.  ABDOMEN:  Soft and benign.  EXTREMITIES:  Warm without calf tenderness, cyanosis, clubbing, or  edema.   IMPRESSION:  No evidence of definite reflux by recent workup.  This  leads in a  different direction in terms of identifying the etiology of  cough and places sinusitis with post-nasal drip syndrome and/or asthma  back on the front burner.  I have therefore recommended proceeding  with a full coronal sinus CT scan and methacholine challenge test before  her next visit.   In terms of treating her reflux. When I suggested she stop the medicine  because it was not clear that she had reflux, she initially hesitated  stating she is better on Zegerid.  The problem is that I also started  Reglan at the same time, and it may be that she is better on Reglan.  To test this hypothesis, I recommended therapeutic withdrawal from  Zegerid to see if she has the same symptoms, and if so she can place  herself back on Zegerid until her next visit in 4 weeks.     Charlaine Dalton. Sherene Sires, MD, [facility]  Electronically Signed    MBW/MedQ  DD: 06/10/2006  DT: 06/11/2006  Job #: [id number]

## 2010-07-10 NOTE — Op Note (Signed)
[facility]  Patient:    JILLIEN, [name] [name]                       MRN: [id number] Proc. Date: 09/13/00 Adm. Date:  [id number] Disc. Date: [id number] Attending:  Sela Hua                           Operative Report  DATE OF BIRTH:                14-Jul-1960  PREOPERATIVE DIAGNOSIS:       Right ovarian mass, probable pelvic                               endometriosis, and symptomatic uterine myoma.  POSTOPERATIVE DIAGNOSIS:      Right ovarian mass, probable pelvic                               endometriosis, and symptomatic uterine myoma                               with confirmed moderate pelvic endometriosis,                               fresh frozen on right ovary benign lesion,                               probable endometriosis.  OPERATION:                    Total abdominal hysterectomy plus bilateral                               salpingo-oophorectomy with cytology and fresh                               frozen.  SURGEON:                      Genia Del, M.D.  ASSISTANT:                    Pershing Cox, M.D.  ANESTHESIOLOGIST:             Gretta Cool., M.D.  DESCRIPTION OF PROCEDURE:     Under general anesthesia with endotracheal intubation, the patient is in the decubitus dorsal position.  She is prepped with Betadine in the abdominal, suprapubic, vulva, and vaginal area.  The bladder catheter is inserted and the patient is draped as usual.  An infraumbilical medium incision is done with the scalpel.  We then opened the aponeurosis on the midline with the scalpel and then the Mayo scissors.  We opened the parietal peritoneum longitudinally with the Metzenbaum scissors. We then proceeded with abdominopelvic cytology, complete the exploration abdominally and in the pelvis.  The liver is soft and regular.  The gallbladder is slightly distended.  Kidneys are normal.  No periaortic lymph nodes felt.  All  surfaces of the parietal peritoneum are smooth.  In the  pelvis, the uterus is enlarged with a large anterior fundic subserous myoma, measuring about 6 cm in diameter.  The right ovary measured about 4 cm and has a cyst.  The left ovary is normal in size.  We then inserted the Balfour retractor and bring the bowel upward with three laps.  We then proceeded with myomectomy of the pedunculated subserous large uterine myoma in order to have a better vision during the case.  We clamped the pedicle with a Kocher clamp and excised the myoma by electrocauterization.  We then applied a suture at that time to control hemostasis.  We grasped the right round ligament with a Babcock.  We sutured the round ligament with a Vicryl 0, and cut it with the electrocautery.  We visualized the right ureter which is in a normal anatomic location.  We then made a window in the broad ligament, clamped the right infundibulopelvic ligament with a curved Heaney.  We double clamped ________ with Mayo scissors, suture with a Vicryl 0, and double suture with a free tie with Vicryl 0.  We then clamped and cut the tube proximally and utero-ovarian ligament, and sent the right ovary for fresh frozen.  The pathology result comes back benign, although some ciliated cells are seen at the cyst wall.  The possibility of an endometrioma is not ruled out.  We then pursued with the left side.  The ovary is normal in size, but a small endometrioma is present and other more superficial lesions of endometriosis are seen on that ovary.  The decision is therefore taken to remove that ovary as discussed with the patient preoperative.  We grasped the round ligament with a Babcock.  We sutured the round ligament with a Vicryl 0, cut it with the electrocautery.  We then localized the left ureter which is in a normal anatomic location.  We made a window in the broad ligament, double clamped, left infundibulopelvic ligament with curved  Heaney.  We cut in between with Mayo scissors.  We sutured with Vicryl 0, and double the suture with a free tie.  We then opened the visceroperitoneum anteriorly and we reclined the bladder downward.  We then skeletonized the uterine arteries on each side. They are clamped with curved Heaney on each side, cut with Mayo scissors, and sutured with Vicryl 0.  The sutures are doubled on each side with Vicryl 0.  We then go on each side of the uterus with straight Heaney.  We clamped, cut, and sutured the cardinal ligament.  We clamped, cut, and sutured the uterosacral ligaments with Vicryl 0, attaching it to the angle on each side. We then reached the angle of the vagina.  We clamped with curved Heaney.  We cut and sutured with Heaney stitch of Vicryl 0 on each side.  We then cut the vaginal mucosa close to the cervix all around.  The specimen is sent to pathology including the left ovary, left tube, uterus, and cervix.  We then closed the vaginal vault from anteriorly to posteriorly with Vicryl 0 in X-stitches.  We then attached the two uterosacral ligaments together on the midline.  We irrigated the pelvic cavity with warm Ringers lactate. Complete hemostasis with the electrocautery on the anterior aspect.  We confirmed good hemostasis at all pedicles.  We then removed the laps and retractor.  We completed hemostasis at the level of the recti muscles and aponeurosis.  The aponeurosis was closed by two half running sutures with Vicryl 0.  We  then completed hemostasis in the adipose tissue with the electrocautery.  The subcutaneous tissue was infiltrated with Marcaine 0.25% plain, 20 cc.  We reapproximated the skin with staples.  A dry dressing was applied.  The counts, compresses, and instruments was complete x 2.  The estimated blood loss was 200 cc.  No complications occurred and the patient was brought to the recovery room in good status.  Note that the patient received Ancef 1 g IV  at induction. DD:  09/13/00 TD:  09/14/00 Job: [id number]

## 2010-07-10 NOTE — Discharge Summary (Signed)
[facility]  Patient:    [name] [name], [name] [name] Visit Number: [id number] MRN: [id number]          Service Type: GYN Location: [id number] Attending Physician:  Genia Del Dictated by:   Genia Del, M.D. Admit Date:  09/13/2000 Discharge Date: 09/16/2000                             Discharge Summary  DATE OF BIRTH:                06-13-1960.  ADMISSION DIAGNOSES:          1. Right ovarian mass with probable pelvic                                  endometriosis.                               2. Symptomatic uterine myoma.  DISCHARGE DIAGNOSES:          1. Right ovarian mass with probable pelvic                                  endometriosis, confirmed moderate pelvic                                  endometriosis.                               2. Symptomatic uterine myoma.  PATHOLOGY:                    Right ovarian cyst was benign endometriotic cyst, confirmed multiple uterine myomas, submucosal, intramural, and subserosal.  HOSPITAL COURSE:              The patient was admitted on September 13, 2000. On the same day, a total abdominal hysterectomy plus bilateral salpingo-oophorectomy was performed. Cytologies and fresh frozen were done. No complication occurred during the surgery. The estimated blood loss was 200 cc.  The postoperative course was uneventful. The patient remained afebrile and stable hemodynamically. Her hemoglobin on postoperative day #1 was 8.9 and it remained stable at the same level on September 15, 2000. The patient was discharged on postoperative day #3, September 16, 2000. She was in a good stable status. She was given postoperative advice.  DISCHARGE MEDICATIONS:        She was given Tylox and Ibuprofen prescriptions. Slow Fe was recommended b.i.d.  DISCHARGE FOLLOWUP:           Followup was organized at [facility] office to remove the rest of the staples on September 19, 2000. She will then follow up three to four weeks after.  Dictated by:   Genia Del, M.D. Attending Physician:  Genia Del DD:  10/26/00 TD:  10/26/00 Job: [id number]

## 2010-07-10 NOTE — Assessment & Plan Note (Signed)
[facility]                               PULMONARY OFFICE NOTE   NAME:[name] [name], [name] [name]                         MRN:          [id number]  DATE:12/29/2005                            DOB:          1961-02-22    PULMONARY/EXTENDED FOLLOWUP OFFICE VISIT   HISTORY:  A 50 year old white female with chronic cough dating back to 1996  with evidence of frontal sinusitis in March 2005 and reflux by previous EGD  in 2005.  She has been seen by Dr. Jarold Motto and the plan is to do a 24-hour  pH probe and manometry with consideration for fundoplication.  She continues  to have frequent throat clearing and finds the only medication that helps is  Tylenol No. 3.  She reports Tramadol helps some but not for as long as  Tylenol No. 3 and would like to get it refilled.   The problem is that she also states that she cannot keep the appointments to  see Dr. Jarold Motto because of work requirements and cannot afford Protonix,  even though she says if she takes it consistently she can tell that she is  better and does not need as much Tylenol No. 3.  She has called several  times after office hours requesting Tylenol No. 3.   I had my nurses explain this to her over the phone and brought her in for a  followup today.  She promises that she will be able to make an appointment  to see Dr. Jarold Motto within the next 8 weeks.   PHYSICAL EXAMINATION:  GENERAL:  She is a pleasant, ambulatory white female  in no acute distress.  She is afebrile with normal vital signs.  HEENT:  Unremarkable, pharynx clear.  She does clear her throat occasionally  during interview and exam but has no evidence of excessive mucosal or  postnasal drainage or cobblestoning.  LUNG FIELDS:  Perfectly clear bilaterally to auscultation and percussion  with no cough elicited on inspiratory/expiratory maneuvers.  HEART:  Regular rate and rhythm without murmur, gallop or rub.  ABDOMEN:  Soft, benign.  EXTREMITIES:  Warm without calf tenderness, cyanosis, clubbing, or edema.   IMPRESSION:  Chronic cyclical cough with a component of reflux fueling it.  Note that she also has a component of sinusitis but has no evidence of a  pulmonary cause of the cough.  In this setting I do not feel it is  appropriate for [place] to continue to refill Tylenol No. 3 indefinitely but did  give her #120 tablets today to take up to twice daily to control the cough,  and in the meantime gave her free samples of Protonix that will last until  she returns to see Dr. Jarold Motto after the holidays.   I also explained to her that Tylenol No. 3 does not actually get to the  problem but simply covers it up and that she would need to see [place] during  office hours for the next prescription of Tylenol No. 3.  Hopefully, if this  is reflux related and she consistently takes the Protonix samples  that were  given and follows the diet, she will notice a reduction in the need for  Tylenol No. 3 before her present supply of 120 runs out.   I spent actually 15-20 minutes of a 25-minute visit going over the nature of  cyclical coughing with the patient and the reason behind the recommendation  not to continue to refill Tylenol No. 3 indefinitely.    ______________________________  Charlaine Dalton Sherene Sires, MD, [facility]    MBW/MedQ  DD: 12/29/2005  DT: 12/29/2005  Job #: [id number]

## 2010-07-10 NOTE — Assessment & Plan Note (Signed)
[facility]                             PULMONARY OFFICE NOTE   NAME:[name], [name]                         MRN:          [id number]  DATE:04/20/2006                            DOB:          1961-01-05    HISTORY:  A 50 year old white female, former smoker, with a chronic  cough that is associated with GERD for which she is being evaluated, but  for some reason continues to miss appointments and returns here  requesting Tylenol #3.  Her most recent excuse is that her mother has  been sick, and she has not been able to keep the appointment for  manometry, the next logical step in workup leading to possible Nissen  fundoplication.   She states she has been consistently taking Protonix 40 mg every day  before breakfast, but still needing both Tylenol #3 and tramadol to  control cough.  Her only new complaints are nasal congestion and right  ear pain for the last 3 days.  She has been taking Mucinex and Delsym  p.r.n.   PHYSICAL EXAMINATION:  She is a somewhat depressed-appearing, ambulatory  white female in no acute distress.  She is afebrile and normal vital signs.  HEENT:  Reveals minimal turbinate edema.  Oropharynx is clear.  There is  no evidence of excessive postnasal drainage or cobblestoning.  NECK:  Supple without cervical adenopathy or tenderness.  Trachea is  midline without thyromegaly.  LUNGS:  Clear bilaterally to auscultation and percussion with no cough,  wheeze on inspiratory and expiratory maneuvers.  HEART:  Regular rhythm without murmurs, gallops, or rubs.  ABDOMEN:  Soft, benign.  EXTREMITIES:  Warm without calf tenderness, cyanosis, clubbing, or  edema.   IMPRESSION:  1. Nonspecific rhinitis.  I would recommend adding Advil Cold and      Sinus p.r.n. since some of her symptoms suggest nasal obstruction      more than drainage at this point.  2. Chronic cough, is at least partly functional at this point.  I did      tell the  patient that we need to eliminate all the potential      mechanisms for the cough which will include keeping her followup      appointments if she expects to have me prescribe medicines that      simply suppress the symptoms, in the form of Tylenol #3 and      tramadol, for which she received a month's supply only.   I do note, also, the patient has a history of abnormal sinus scan in  March of 2007.  Would consider repeating the sinus scan if her symptoms  persist despite optimal treatment of her gastroesophageal reflux  disease.    Charlaine Dalton. Sherene Sires, MD, [facility]  Electronically Signed   MBW/MedQ  DD: 04/21/2006  DT: 04/21/2006  Job #: [id number]

## 2010-07-10 NOTE — Assessment & Plan Note (Signed)
[name] [name]                           GASTROENTEROLOGY OFFICE NOTE   NAME:[name], [name]                         MRN:          [id number]  DATE:11/11/2005                            DOB:          [birth year]    [name] [name] continues to have coughing, and hoarseness associated with acid reflux  but only can afford to take daily Prilosec.  She has had rather vigorous  treatment of her cough by Dr. Sherene [name], and he has some detailed notes in her  chart concerning such.  I previously did an endoscopy on her in January of  2005 and it showed a small hiatal hernia without evidence of erosive  esophagitis. She was not much better when she took daily Protonix.   She has idiopathic ulcerative proctosigmoiditis and refuses to take chronic  immunosalicylate therapy.  She is having flares of her proctitis every  several months responsive to Colazal and Canasa1 gm suppository.   She had a flare of her proctitis 3 weeks' ago and restarted  her Colazal  which she takes erratically.  Also Canasa suppositories at bedtime and is  currently doing much better.  She had some vague abdominal gas, bloating,  and cramping in her left upper quadrant. She has had no fever, chills, skin  rashes, joint pains, or oral stomatitis problems.  She denies the use of  NSAIDs or other medications or antibiotics.   Her last colonoscopy was in January 2005 at which time she had some mild  ulcerative proctosigmoiditis.  Previous inflammatory bowel disease  serologies have been negative.   PHYSICAL EXAMINATION:  She is a healthy appearing, white female appearing  her stated age.  She weighs 205 pounds and blood pressure is 120/74.  Pulse  was 94 and regular.  ABDOMEN:  Her abdominal exam showed no organomegaly, masses, or tenderness.  Bowel sounds were normal.  Inspection of the rectum was unremarkable as was the rectal exam.  There  were no fissures or fistulae, masses, or tenderness.  Stool was  formed and  guaiac-negative.   IMPRESSION:  1. Ulcerative proctosigmoidoscopy responding to repeat immunosalicylate      therapy.  Basic compliance has been a problem.  2. Probable extra esophageal manifestations of gastroesophageal reflux      disease.  3. Chronic fatigue of questionable etiology.   RECOMMENDATION:  1. Check screening and laboratory parameters including thyroid functions.  2. Switch from Colazal to Lialda 2.4 gm once a day.  It is a long acting      immunosalicylate.  3. Taper off of Canasa suppositories as tolerated.  4. A 24-hour pH probe and manometry--off TPI therapy for 72 hours.  5. Probable surgical referral to Dr. Luretha [name] for consideration of      fundoplication.  6. Have switched from Prilosec to daily Protonix 40 mg before the first      meal of the day to see      how she does symptomatically in the interim.  We will follow her up      once all of the above laboratory data, and the diagnostic  studies have      been completed.                                   [name] Rea. Jarold Motto, MD, [name] [name], [place]   DRP/MedQ  DD:  11/11/2005  DT:  11/12/2005  Job #:  [id number]   cc:   [name] [name]. [name] Sires, MD, FCCP  [name] [name]. [name] Deutscher, MD

## 2010-07-10 NOTE — Assessment & Plan Note (Signed)
[facility]                               PULMONARY OFFICE NOTE   NAME:[name] [name], [name] [name]                         MRN:          [id number]  DATE:11/09/2005                            DOB:          [birth year]    PULMONARY/FOLLOWUP VISIT:   HISTORY:  A 50 year old white female with cough dating back 10 years with  the only lead being evidence of mild sinusitis by previous CT scan on  May 18, 2005, and the fact that the cough is worse nocturnally.  Because  she was a smoker and had a sensation of something stuck in her throat.  I  recommended bronchoscopy which was accomplished on October 28, 2005, and  showed absolutely pristine airways with no evidence of purulent secretions.  Since her visit she was asked to stop PPI therapy because she was convinced  it did not help her cough.  Since then the cough is worse than baseline.   The chart review indicates the patient does have evidence of hiatal hernia  with GERD for which it was recommended she stay on PPI therapy indefinitely  on Dr. Norval Gable last visit on February 25, 2003.   PHYSICAL EXAMINATION:  GENERAL:  She is somewhat depressed, ambulatory,  moderately obese, white female in no acute distress.  VITAL SIGNS:  Afebrile with normal vital signs except for a blood pressure  of 140/100.  HEENT:  Unremarkable.  Nasal turbinates normal.  Oropharynx is clear.  NECK:  Supple without cervical adenopathy or tenderness.  Trachea is  midline.  No thyromegaly.  LUNGS:  Lungs fields are perfectly clear bilaterally on auscultation  percussion.  HEART:  Regular rhythm without murmur, gallop, or rub.  ABDOMEN:  Soft, benign.  EXTREMITIES:  Warm without calf tenderness, cyanosis, clubbing, edema.   Initial bronchoscopy lavage shows normal cytology with negative AFB smears.   IMPRESSION:  Chronic cough dating back over 10 years associated with  sinusitis by most recent CT scan dated Marcy 27, 2007, and  perhaps  aggravated by a component of reflux which was previously documented by upper  endoscopy, February 25, 2003.  However, I believe we can safely say there is  no significant pulmonary issue here.  The sense that she has a foreign  body in the throat, I believe is simply secondary to excessive coughing.   I, therefore, recommend the following:  1. First, we need to be sure the reflux is treated aggressively as she has      already indicated by her history that the cough is worse off of reflux      medicine.  I recommend she start back on Prilosec b.i.d. and keep an      appointment to see Dr. Jarold Motto, her GI physician of record for      further treatment directed at reflux.  2. Secondly, I believe an ENT evaluation is needed because of evidence of      sinusitis noted previously to eradicate this problem from the      differential diagnosis of chronic cough but only would do this after  adequate control of reflux per GI.  3. Hypertension, noted on exam today.  She was referred back to Urgent      Care at [facility] for followup of this and also referral to ENT      under [employer] guidelines.                                   Charlaine Dalton. Sherene Sires, MD, [facility]   MBW/MedQ  DD:  11/09/2005  DT:  11/10/2005  Job #:  [id number]   cc:   Urgent Care at [facility]

## 2010-07-10 NOTE — Assessment & Plan Note (Signed)
[facility]                             PULMONARY OFFICE NOTE   NAME:[name] [name], [name] [name]                         MRN:          [id number]  DATE:05/17/2006                            DOB:          October 17, 1960    HISTORY:  This is a 50 year old white female with a cough dating back to  1996, and overt symptoms of throat pain when she lies down at night,  previously attributed to bad reflux.  Note that she does have evidence  of a hiatal hernia by endoscopy on February 24, 2006; however, on her most  recent manometry and pH probe done while on therapy with Protonix  b.i.d., she had a normal studies by the verbal report.  She was  therefore told to reduce the Protonix down to one daily.  Now she is  having increasing symptoms of throat pain when she lies down at night.  She reports that she had the same throat pain while on the study, but  it was not as severe as it is now.  She constantly clears her throat  during the exam, but did not produce excess mucus.  She does have a  sensation of constant congestion.   MEDICATIONS:  For full list of medications, please see the face sheet  dated May 17, 2006.  Note that the patient is on multiple  psychotropics.   PHYSICAL EXAMINATION:  GENERAL:  She is a pleasant but somewhat  depressed-appearing ambulatory white female, with a hopeless and  helpless affect and attitude.  VITAL SIGNS:  Stable.  HEENT:  Pristine.  Oropharynx clear.  No evidence of excessive postnasal  drainage or cobblestoning.  Dentition is intact.  Ear canals are clear  bilaterally.  Nasopharynx normal.  LUNGS:  Fields perfectly bilaterally to percussion with no cough on  inspiratory or expiratory maneuvers.  HEART:  A regular rate and rhythm without murmurs or rubs.  ABDOMEN:  Soft.  EXTREMITIES:  No calf tenderness.  No clubbing, cyanosis or edema.   CHART REVIEW:  The chart review indicates today that she did have  evidence of mild left  frontal sinusitis one year ago and a normal  bronchoscopy in September 2007, with no evidence of any eosinophilia.   IMPRESSION:  Upper airway cough syndrome, most likely related to  postnasal drip syndrome.   RECOMMENDATIONS:  1. To initiate nasal steroids consistently (flier reviewed), and also      Bromfed-PD one b.i.d., to eliminate excess postnasal drainage, as      well as serve as an anticholinergic to suppress part of the loop of      the upper airway cough syndrome.  If this does not resolve the      cough, I am going to recommend repeat sinus CT scan before she      returns in four weeks and emphasize to her that my ability to      refill her tramadol and Ultram, which she uses p.r.n. cough, is      contingent with her keeping up with the instructions.  2. In terms of her  nocturnal complaints of heartburn, it certainly      sounds like the b.i.d. dose controlled the symptoms better;      however, note that the pH and manometry were felt to be normal.      I am going to ask her to stop Protonix at this point, thinking that      she may or may not have had an adverse effect from Protonix, but      try her on just a night time dosage of Zegerid plus Reglan, to see      to what extent the night time heartburn can be helped.  If it      cannot be benefited from this combination, then I would stop it      completely, using a reverse therapeutic trial.   I gave her one month's supply of samples of Zegerid, prescriptions for  metoclopramide, Bromfed and Tylenol #3, #120, Ultram #120 and told her  this will have to last the entire month.     Charlaine Dalton. Sherene Sires, MD, [facility]  Electronically Signed    MBW/MedQ  DD: 05/17/2006  DT: 05/17/2006  Job #: [id number]   cc:   [facility]

## 2010-07-10 NOTE — Assessment & Plan Note (Signed)
[facility]                             PULMONARY OFFICE NOTE   NAME:[name] [name], [name] [name]                         MRN:          [id number]  DATE:02/21/2006                            DOB:          12-Nov-1960    HISTORY:  A 50 year old white female with chronic cough dating back over  10 years, felt largely to be due to reflux, and in the process of a  workup by Dr. Jarold Motto, in the meantime comes in with increasing cough,  especially when she lies down at night.  The cough is much worse when  she runs out of Protonix, which she is having trouble affording, or when  she runs out of either Ultram or Tylenol #3, which she uses  interchangeably to control excessive coughing.  She denies any purulent  sputum, fever, chills, sweats, orthopnea, PND, or leg swelling.   PHYSICAL EXAMINATION:  She is a slightly hoarse, ambulatory, white  female in no acute distress.  Afebrile, stable signs.  HEENT:  No turbinate edema.  Oropharynx is clear with no evidence of  excessive posterior nasal drainage or cobblestoning, yet she clears her  throat frequently during our interview and exam.  Lung fields completely clear bilaterally to auscultation and percussion  with no cough elicited on inspiratory/expiratory maneuvers.  HEART:  Regular rate and rhythm without murmur, gallop, or rub.  ABDOMEN:  Soft, benign.  EXTREMITIES:  Warm without calf tenderness, cyanosis, or clubbing.   IMPRESSION:  Upper airway cough syndrome that most likely is related to  poorly controlled reflux in this patient with moderate obesity and a  fairly convincing cause and effect relationship between when she is  taking medications and when the cough is under better control.  The  problem has been one of adherence, especially since it is cheaper for  her to take medicines that cover up the cough (generic tramadol and  Tylenol #3) than to afford the medication and the workup necessary to  eradicate the  cause of the cough (PPI therapy specifically).   I, therefore, gave her 50 Protonix pills and a limited supply of Tylenol  #3 and tramadol (no refills) and asked her to contact Dr. Norval Gable  office to schedule the workup to see if poorly controlled reflux can be  treated more aggressively either medically or surgically.     Charlaine Dalton. Sherene Sires, MD, [facility]  Electronically Signed    MBW/MedQ  DD: 02/21/2006  DT: 02/21/2006  Job #: [id number]   cc:   [facility]  Dr. Jarold Motto

## 2010-07-10 NOTE — Assessment & Plan Note (Signed)
[facility]                             PULMONARY OFFICE NOTE   NAME:[name] [name], RHYTHM                         MRN:          [id number]  DATE:01/24/2006                            DOB:          1961/01/29    PULMONARY/FOLLOWUP OFFICE VISIT   HISTORY:  A 50 year old white female with a cough dating back over 10  years with documented sinus disease in the past, and also chronic  reflux, for which she is considering Nissen fundoplication.  She has  found that she just cannot make it on 4 times a day Tylenol #3 as we  previously agreed on, and has found by trial and error that, if she uses  tramadol first, she uses much less Tylenol #3, and would like a  prescription for this.  There has been no change in her symptoms of  mostly dry cough that is daytime greater than nighttime, with no  significant dyspnea, active sinus or reflux symptoms while taking  Protonix 40 mg before breakfast daily.   PHYSICAL EXAMINATION:  She is a pleasant ambulatory white female in no  acute distress.  VITAL SIGNS:  Stable vital signs.  HEENT:  Reveals minimal turbinate edema.  Oropharynx is clear with no  evidence of excessive post-nasal drainage, cobblestoning, or erythema.  NECK:  Supple without cervical adenopathy or tenderness.  Trachea is  midline.  No thyromegaly.  LUNGS:  Fields are perfectly clear bilaterally to auscultation and  percussion.  CARDIAC:  Regular rate and rhythm without murmur, gallop, or rub.  ABDOMEN:  Soft and benign.  EXTREMITIES:  Warm without calf tenderness, cyanosis, clubbing, or  edema.   IMPRESSION:  Chronic cyclical cough in a patient with documented hiatal  hernia with reflux, considering Nissen fundoplication.  In this setting,  coughing can certainly cause secondary reflux or exacerbate the hernia,  and therefore, recommended taking tramadol 50 mg q.i.d. and reserve  Tylenol #3 for p.r.n. use only if needed.   I emphasized to the patient  that I would not continue to refill the  tramadol and Tylenol #3 if she were not under the care of a  gastroenterologist to solve the underlying problem, and she has an  appointment for this purpose with our GI division.   Pulmonary followup can be p.r.n.     Charlaine Dalton. Sherene Sires, MD, [facility]  Electronically Signed    MBW/MedQ  DD: 01/24/2006  DT: 01/24/2006  Job #: [id number]

## 2010-07-10 NOTE — Op Note (Signed)
[name] [name], [name] [name]                ACCOUNT NO.:  [id number]   MEDICAL RECORD NO.:  [id number]          PATIENT TYPE:  AMB   LOCATION:  CARD                         FACILITY:  [facility]   PHYSICIAN:  Casimiro Needle B. Sherene Sires, MD, FCCPDATE OF BIRTH:  1960-07-26   DATE OF PROCEDURE:  10/28/2005  DATE OF DISCHARGE:                                 OPERATIVE REPORT   PROCEDURE:  Fiberoptic bronchoscopy with lavage.   HISTORY AND INDICATIONS FOR PROCEDURE:  A 50 year old white female former  smoker with a persistent cough dating back 10 years consisting of a  sensation of something stuck in her throat (she points to her suprasternal  notch).  Because of the concern of malignancy in this former smoker, I  recommended bronchoscopy and she agreed to the procedure after a full  discussion of risks, benefits, and alternatives.  Please see dictated office  notes for further information.   The procedure was performed in the bronchoscopy suite with continuous  monitoring by surface ECG and oximetry.  She maintained adequate saturations  throughout the procedure in sinus rhythm.  She received a total of 2.5 mg of  IV Versed and 25 mg of IV Demerol for adequate sedation and cough  suppression.   The right nares and oropharynx were liberally anesthetized with 1% lidocaine  by updraft nebulizer and the right nares prepared with 2% lidocaine jelly.   Using a standard flexible fiberoptic bronchoscope, the right nares was  easily cannulated with good visualization of the entire oropharynx and  larynx.  The cords moved normally and there were no apparent upper airway  lesions.  The main finding was one of diffuse edema and erythema, especially  over the arytenoids consistent with chronic reflux.   However, there was no evidence of neoplasm.   Using additional 1% lidocaine as needed, the entire tracheobronchial tree  was explored bilaterally with the following finding.  The trachea and carina  and all the major  airways opened widely at the subsegmental level.  There  were no endobronchial lesions.  There was no evidence of any erythema or  excess secretions or even chronic bronchitic changes.   The right middle lobe was randomly selected for lavage and returned mostly  clear fluid.  This was sent for cytology, AFB, smear and culture and also  cell count differential to be complete.   IMPRESSION:  1. Laryngeal edema consistent with laryngopharyngeal      reflux/gastroesophageal reflux disease as the cause of the chronic      cough.  2. No evidence of foreign body or any excess secretions in any of the      major airways and of neoplasm in this former smoker.   RECOMMENDATIONS:  No change in chronic therapy with follow-up to be arranged  as an outpatient.           ______________________________  Charlaine Dalton. Sherene Sires, MD, [facility]     MBW/MEDQ  D:  10/28/2005  T:  10/28/2005  Job:  [id number]

## 2010-07-10 NOTE — Assessment & Plan Note (Signed)
[facility]                               PULMONARY OFFICE NOTE   NAME:Nelson, [name] [name]                         MRN:          [id number]  DATE:12/17/2005                            DOB:          11/10/1960    HISTORY OF PRESENT ILLNESS:  The patient is a 50 year old white female  patient of Dr. Thurston Hole who has a known history of chronic cough over the last  10 years associated with sinusitis and complaining of reflux.  The patient  was recently seen approximately one month ago for persistent coughing.  At  that time had been increased on her PPI up to twice a day dosing.  The  patient reports she was subsequently seen by Dr. Jarold Motto and set up for a  pH probe and manometry.  Unfortunately, her father passed away during this  time and she has not been able to complete this recommended attempt.  Also  had been changed over from Prilosec to Protonix. However, she has been  unable to afford this due to high cost of insurance copayments.  She is  currently out of Protonix and off her PPI. She complains of that her cough  has worsened and has increased over the last several days and is worse at  night.  She denies any purulent sputum, fever, chest pain, orthopnea, PND,  or leg swelling.  The patient is currently using Delsym and Ultram.  However, has run out of her Tylenol No. 3 prescription and needs a refill.   PHYSICAL EXAMINATION:  GENERAL:  The patient is a pleasant female in no  acute distress.  VITAL SIGNS: She is afebrile with stable vital signs.  Saturation is 97% on  room air.  HEENT:  Nasopharynx __________.  Posterior pharynx is clear.  NECK:  Supple.  No adenopathy.  No JVD.  LUNGS:  Lung sounds reveal diminished breath sounds at the bases; otherwise  clear.  CARDIAC:  Regular rate and rhythm.  ABDOMEN:  Soft __________ .  EXTREMITIES:  Warm without any edema.   IMPRESSION AND PLAN:  Chronic cough.  The patient is to restart PPI therapy  and may use Prilosec if she is unable to obtain Protonix and continue it  twice a day dosing.  She is recommended on cough suppression regimen  including nonmint lozenges, Mucinex DM or Delsym twice daily along with  Ultram for cough that is not relieved with Mucinex and then may add in  Tylenol No. 3.  I have prepared a prescription for Tylenol No. 3 #30 one  p.o. q.4-6h. as needed for severe coughing.  No refills were given.  The  patient will follow back up with Dr. Jarold Motto for her pH probe in the next  one to two weeks and then follow back up with Dr. Sherene Sires one month following  or as needed.     ______________________________  Rubye Oaks, NP    ______________________________  Charlaine Dalton. Sherene Sires, MD, [facility]   TP/MedQ  DD: 12/17/2005  DT: 12/18/2005  Job #: [id number]

## 2010-07-10 NOTE — Assessment & Plan Note (Signed)
[facility]                                   ON-CALL NOTE   NAME:[name], [name]                         MRN:          [id number]  DATE:09/13/2005                            DOB:          1960/05/23    TELEPHONE ENCOUNTER   The patient claims that Dr. Sherene Sires provides Tylenol No. 3 for her.  She could  not specify the reason why.  She was calling from phone number [phone].  She states that she is currently in [place] and is not coming home  until Thursday.  The patient appeared to be in no distress and was having no  issues with pain or cough at this time.  She was instructed to call the  office in the morning so that such medications could be verified with her  medical records and no narcotics would be authorized after hours.  She  understood the instructions and stated she would call the office in the  morning.  This call came in on July 23, at 1907 hours.                                   Gailen Shelter, MD   CLG/MedQ  DD:  09/13/2005  DT:  09/14/2005  Job #:  [id number]

## 2010-08-11 ENCOUNTER — Emergency Department (HOSPITAL_COMMUNITY)
Admission: EM | Admit: 2010-08-11 | Discharge: 2010-08-11 | Disposition: A | Discharge status: Home / Self Care | Payer: Self-pay | Attending: Emergency Medicine | Admitting: Emergency Medicine

## 2010-08-11 DIAGNOSIS — K219 Gastro-esophageal reflux disease without esophagitis: Secondary | ICD-10-CM | POA: Insufficient documentation

## 2010-08-11 DIAGNOSIS — M25569 Pain in unspecified knee: Secondary | ICD-10-CM | POA: Insufficient documentation

## 2010-08-11 DIAGNOSIS — Z9889 Other specified postprocedural states: Secondary | ICD-10-CM | POA: Insufficient documentation

## 2010-08-11 DIAGNOSIS — F319 Bipolar disorder, unspecified: Secondary | ICD-10-CM | POA: Insufficient documentation

## 2010-08-11 DIAGNOSIS — IMO0002 Reserved for concepts with insufficient information to code with codable children: Secondary | ICD-10-CM | POA: Insufficient documentation

## 2010-08-11 DIAGNOSIS — Z87442 Personal history of urinary calculi: Secondary | ICD-10-CM | POA: Insufficient documentation

## 2010-08-11 DIAGNOSIS — Y838 Other surgical procedures as the cause of abnormal reaction of the patient, or of later complication, without mention of misadventure at the time of the procedure: Secondary | ICD-10-CM | POA: Insufficient documentation

## 2010-09-21 ENCOUNTER — Telehealth: Payer: Self-pay | Admitting: Gastroenterology

## 2010-09-21 NOTE — Telephone Encounter (Addendum)
Hx of Inf. Bowel Disease, Diverticulosis, Hyperplastic polyps on 10/15/2009 COLON and Quiescent Chronic Proctitis. Pt reports she's unable to keep food down x 4 days. She has tried to cut back to small frequent and is not having luck with that either. She starts coughing, from reflux she states, and then ends up vomiting. She can keep liquids down. She denies pain but has seen blood at times in her diarrhea; no temp. Pt given appt with Dr Jarold Motto tomorrow at 2:45pm.

## 2010-09-22 ENCOUNTER — Ambulatory Visit: Payer: Self-pay | Admitting: Gastroenterology

## 2010-09-22 ENCOUNTER — Telehealth: Payer: Self-pay | Admitting: Gastroenterology

## 2010-09-22 NOTE — Telephone Encounter (Signed)
Charge cx fee

## 2010-10-20 ENCOUNTER — Ambulatory Visit: Payer: Self-pay | Admitting: Gastroenterology

## 2010-11-04 ENCOUNTER — Ambulatory Visit: Payer: Medicare Other | Admitting: Physical Therapy

## 2010-11-17 ENCOUNTER — Ambulatory Visit: Payer: Medicare Other | Admitting: Rehabilitative and Restorative Service Providers"

## 2010-11-23 LAB — HEMOGLOBIN AND HEMATOCRIT, BLOOD: HCT: 39.6

## 2011-03-10 ENCOUNTER — Telehealth: Payer: Self-pay | Admitting: Gastroenterology

## 2011-03-10 ENCOUNTER — Ambulatory Visit (INDEPENDENT_AMBULATORY_CARE_PROVIDER_SITE_OTHER): Payer: Medicare Other | Admitting: Gastroenterology

## 2011-03-10 ENCOUNTER — Encounter: Payer: Self-pay | Admitting: Gastroenterology

## 2011-03-10 DIAGNOSIS — K5120 Ulcerative (chronic) proctitis without complications: Secondary | ICD-10-CM | POA: Insufficient documentation

## 2011-03-10 DIAGNOSIS — K649 Unspecified hemorrhoids: Secondary | ICD-10-CM | POA: Insufficient documentation

## 2011-03-10 HISTORY — DX: Ulcerative (chronic) proctitis without complications: K51.20

## 2011-03-10 MED ORDER — LIDOCAINE 5 % EX OINT: TOPICAL_OINTMENT | CUTANEOUS | Status: DC

## 2011-03-10 MED ORDER — HYDROCORTISONE ACETATE 25 MG RE SUPP: 25.0000 mg | RECTAL | Status: AC

## 2011-03-10 MED ORDER — MESALAMINE 1000 MG RE SUPP: 1000.0000 mg | Freq: Every day | RECTAL | Status: DC

## 2011-03-10 MED ORDER — PEG-KCL-NACL-NASULF-NA ASC-C 100 G PO SOLR: 1.0000 | Freq: Once | ORAL | Status: DC

## 2011-03-10 NOTE — Telephone Encounter (Signed)
annusol Hc

## 2011-03-10 NOTE — Patient Instructions (Addendum)
You have been given a separate informational sheet regarding your tobacco use, the importance of quitting and local resources to help you quit. Your procedure has been scheduled for 03/30/2011, please follow the seperate instructions.  Use Anusol per rectum every morning, Your prescription has been sent to you pharmacy.  Use Canasa per rectum at bedtime, Your prescription has been sent to you pharmacy.  Use Xylocaine per rectum as needed, Your prescription has been sent to you pharmacy.

## 2011-03-10 NOTE — Progress Notes (Signed)
History of Present Illness:  This is a pleasant 51 year old Caucasian female with a history of ulcerative proctitis. She now presents with rectal bleeding, rectal pain, rectal itching, and  lower, cramping, he has some bloating. She takes twice a day Prilosec for acid reflux symptoms, and denies current symptomatology. She last had colonoscopy several years ago, and there is no evidence of colonic polyposis. She does have chronic pain syndrome and his own regular Vicodin and also Prozac, Abilify and Lamictal and Neurontin per Dr. Norberto Sorenson. She denies recent antibiotic use.  I have reviewed this patient's present history, medical and surgical past history, allergies and medications.     ROS: The remainder of the 10 point ROS is negative     Physical Exam: General well developed well nourished patient in no acute distress, appearing her stated age Eyes PERRLA, no icterus, fundoscopic exam per opthamologist Skin no lesions noted Neck supple, no adenopathy, no thyroid enlargement, no tenderness Chest clear to percussion and auscultation Heart no significant murmurs, gallops or rubs noted Abdomen no hepatosplenomegaly masses or tenderness, BS normal. There are large swollen external hemorrhoids are evident without fissures or fistulae. Digital exam was deferred. Rectal inspection normal no fissures, or fistulae noted. . Stool guaiac negative. Extremities no acute joint lesions, edema, phlebitis or evidence of cellulitis. Neurologic patient oriented x 3, cranial nerves intact, no focal neurologic deficits noted. Psychological mental status normal and normal affect.  Assessment and plan: Active external hemorrhoids, rule out relapse of inflammatory bowel disease. Reschedule colonoscopy, prescribe local 5% Xylocaine cream, Anusol-HC suppositories in the morning, and Canasa 1 g suppositories at bedtime with daily Metamucil. She is to continue her other medications as listed and reviewed.  Encounter  Diagnosis  Name Primary?  . Hemorrhoids Yes

## 2011-03-10 NOTE — Telephone Encounter (Signed)
Dr Jarold Motto would you like her to just use anusol or is there something else she can have in place of the canasa?

## 2011-03-10 NOTE — Telephone Encounter (Signed)
Pt aware, rx cx.

## 2011-03-11 ENCOUNTER — Telehealth: Payer: Self-pay | Admitting: Gastroenterology

## 2011-03-11 MED ORDER — HYDROCODONE-ACETAMINOPHEN 5-500 MG PO TABS: 1.0000 | ORAL_TABLET | Freq: Three times a day (TID) | ORAL | Status: DC | PRN

## 2011-03-11 NOTE — Telephone Encounter (Signed)
Patient called with rectal pain per Mike Gip, PA ok to refill vicodin, pt aware rx faxed.

## 2011-03-14 ENCOUNTER — Telehealth: Payer: Self-pay | Admitting: Internal Medicine

## 2011-03-14 MED ORDER — PROMETHAZINE HCL 25 MG RE SUPP: 25.0000 mg | Freq: Three times a day (TID) | RECTAL | Status: DC | PRN

## 2011-03-14 NOTE — Telephone Encounter (Signed)
This patient is having increasing sinus drainage cough nausea and vomiting. She cannot always keep her oral promethazine down and was requesting promethazine suppositories. She is followed in our practice for a history of ulcerative proctitis and was recently seen for hemorrhoids.  She denies any other significant GI complaints such as abdominal pain at this time.  I have prescribed promethazine suppositories, she intends to follow up with her primary care this week regarding the sinus drainage and cough.

## 2011-03-29 ENCOUNTER — Telehealth: Payer: Self-pay | Admitting: Gastroenterology

## 2011-03-29 NOTE — Telephone Encounter (Signed)
Created new instructions and mailed to pt.

## 2011-03-30 ENCOUNTER — Encounter: Payer: Medicare Other | Admitting: Gastroenterology

## 2011-03-31 ENCOUNTER — Other Ambulatory Visit: Payer: Self-pay | Admitting: Internal Medicine

## 2011-04-04 ENCOUNTER — Telehealth: Payer: Self-pay | Admitting: Internal Medicine

## 2011-04-04 MED ORDER — PROMETHAZINE HCL 12.5 MG RE SUPP: 12.5000 mg | Freq: Four times a day (QID) | RECTAL | Status: DC | PRN

## 2011-04-04 NOTE — Telephone Encounter (Signed)
Ms. Schwenn called reporting seeing Dr. Jarold Motto Jan 2013 Still hemorrhoids and some bloody diarrhea (as discussed in clinic, colonoscopy planned for 04/12/11) Now with post nasal drip, cough and post-tussive emesis.  Saw PCP, got decongestant and antihistamines. Still with daily Nausea along with some vomiting.  Tolerates liquids, has harder time keeping down solid food. Also phenergan tabs aren't helping b/c she is vomiting them up. No hematemesis. No presyncopal symptoms. Requesting PR phenergan.  Does not think she can afford Zofran ODT Will call in Rx for PR promethazine.   RN can check on patient Monday. Route to [place].

## 2011-04-05 NOTE — Telephone Encounter (Signed)
This is a primary care issue

## 2011-04-05 NOTE — Telephone Encounter (Signed)
Called pt who stated she hasn't had time to pick up the suppositories, but hopes to get then today. She still has the drainage which nauseates her. Informed pt she can try Mucinex to thin her secrections to help with the nausea. She needs to keep well hydrated; pt stated understanding.

## 2011-04-12 ENCOUNTER — Encounter: Payer: Self-pay | Admitting: Gastroenterology

## 2011-04-12 ENCOUNTER — Other Ambulatory Visit: Payer: Self-pay | Admitting: Gastroenterology

## 2011-04-12 ENCOUNTER — Ambulatory Visit (AMBULATORY_SURGERY_CENTER): Payer: Medicare Other | Admitting: Gastroenterology

## 2011-04-12 VITALS — BP 146/91 | HR 88 | Temp 96.6°F | Resp 17 | Ht 65.0 in | Wt 240.0 lb

## 2011-04-12 DIAGNOSIS — K626 Ulcer of anus and rectum: Secondary | ICD-10-CM

## 2011-04-12 DIAGNOSIS — Z860100 Personal history of colon polyps, unspecified: Secondary | ICD-10-CM

## 2011-04-12 DIAGNOSIS — K5730 Diverticulosis of large intestine without perforation or abscess without bleeding: Secondary | ICD-10-CM

## 2011-04-12 DIAGNOSIS — K649 Unspecified hemorrhoids: Secondary | ICD-10-CM

## 2011-04-12 HISTORY — DX: Ulcer of anus and rectum: K62.6

## 2011-04-12 MED ORDER — DEXLANSOPRAZOLE 60 MG PO CPDR: 60.0000 mg | DELAYED_RELEASE_CAPSULE | Freq: Every day | ORAL | Status: DC

## 2011-04-12 MED ORDER — SODIUM CHLORIDE 0.9 % IV SOLN: 500.0000 mL | INTRAVENOUS | Status: DC

## 2011-04-12 NOTE — Op Note (Signed)
[facility] [address] [place], [place]  [address]  COLONOSCOPY PROCEDURE REPORT  PATIENT:  [name] [name], [name] [name]  MR#:  [id number] BIRTHDATE:  1960/11/24, 50 yrs. old  GENDER:  female ENDOSCOPIST:  Vania Rea. Jarold Motto, MD, [facility] REF. BY: PROCEDURE DATE:  04/12/2011 PROCEDURE:  Average-risk screening colonoscopy G0121 ASA CLASS:  Class II INDICATIONS:  rectal bleeding MEDICATIONS:   propofol (Diprivan) 350 mg IV  DESCRIPTION OF PROCEDURE:   After the risks and benefits and of the procedure were explained, informed consent was obtained. Digital rectal exam was performed and revealed no abnormalities and perianal skin tags.   The [id number] endoscope was introduced through the anus and advanced to the cecum, which was identified by both the appendix and ileocecal valve.  The quality of the prep was excellent, using MoviPrep.  The instrument was then slowly withdrawn as the colon was fully examined. <<PROCEDUREIMAGES>>  FINDINGS:  There were mild diverticular changes in left colon. diverticulosis was found.  An erosion was found. RECTAL EROSION WITH CLOT,SEE PICTURES  No polyps or cancers were seen.   CLOT The scope was then withdrawn from the patient and the procedure completed.  COMPLICATIONS:  None ENDOSCOPIC IMPRESSION: 1) Diverticulosis,mild,left sided diverticulosis 2) Erosion 3) No polyps or cancers PROBABLE SOLITARY RECTAL ULCER SYNDROME WITH RECENT BLEEDING. RECOMMENDATIONS: 1) Continue current medications 2) High fiber diet with liberal fluid intake. 3) Repeat Colonscopy in 10 years. 4) metamucil or benefiber  REPEAT EXAM:  No  ______________________________ Vania Rea. Jarold Motto, MD, [facility]  CC:  n. eSIGNED:   Vania Rea. Patterson at 04/12/2011 10:49 AM  Emiliano Dyer, [id number]

## 2011-04-12 NOTE — Progress Notes (Signed)
Patient did not experience any of the following events: a burn prior to discharge; a fall within the facility; wrong site/side/patient/procedure/implant event; or a hospital transfer or hospital admission upon discharge from the facility. (G8907) Patient did not have preoperative order for IV antibiotic SSI prophylaxis. (G8918)  

## 2011-04-12 NOTE — Patient Instructions (Signed)
YOU HAD AN ENDOSCOPIC PROCEDURE TODAY AT THE [facility]: Refer to the procedure report that was given to you for any specific questions about what was found during the examination.  If the procedure report does not answer your questions, please call your gastroenterologist to clarify.  If you requested that your care partner not be given the details of your procedure findings, then the procedure report has been included in a sealed envelope for you to review at your convenience later.  YOU SHOULD EXPECT: Some feelings of bloating in the abdomen. Passage of more gas than usual.  Walking can help get rid of the air that was put into your GI tract during the procedure and reduce the bloating. If you had a lower endoscopy (such as a colonoscopy or flexible sigmoidoscopy) you may notice spotting of blood in your stool or on the toilet paper. If you underwent a bowel prep for your procedure, then you may not have a normal bowel movement for a few days.  DIET: Your first meal following the procedure should be a light meal and then it is ok to progress to your normal diet.  A half-sandwich or bowl of soup is an example of a good first meal.  Heavy or fried foods are harder to digest and may make you feel nauseous or bloated.  Likewise meals heavy in dairy and vegetables can cause extra gas to form and this can also increase the bloating.  Drink plenty of fluids but you should avoid alcoholic beverages for 24 hours.  ACTIVITY: Your care partner should take you home directly after the procedure.  You should plan to take it easy, moving slowly for the rest of the day.  You can resume normal activity the day after the procedure however you should NOT DRIVE or use heavy machinery for 24 hours (because of the sedation medicines used during the test).    SYMPTOMS TO REPORT IMMEDIATELY: A gastroenterologist can be reached at any hour.  During normal business hours, 8:30 AM to 5:00 PM Monday through Friday,  call [phone].  After hours and on weekends, please call the GI answering service at [phone] who will take a message and have the physician on call contact you.   Following lower endoscopy (colonoscopy or flexible sigmoidoscopy):  Excessive amounts of blood in the stool  Significant tenderness or worsening of abdominal pains  Swelling of the abdomen that is new, acute  Fever of 100F or higher  Following upper endoscopy (EGD)  Vomiting of blood or coffee ground material  New chest pain or pain under the shoulder blades  Painful or persistently difficult swallowing  New shortness of breath  Fever of 100F or higher  Black, tarry-looking stools  FOLLOW UP: If any biopsies were taken you will be contacted by phone or by letter within the next 1-3 weeks.  Call your gastroenterologist if you have not heard about the biopsies in 3 weeks.  Our staff will call the home number listed on your records the next business day following your procedure to check on you and address any questions or concerns that you may have at that time regarding the information given to you following your procedure. This is a courtesy call and so if there is no answer at the home number and we have not heard from you through the emergency physician on call, we will assume that you have returned to your regular daily activities without incident.  SIGNATURES/CONFIDENTIALITY: You and/or your care   partner have signed paperwork which will be entered into your electronic medical record.  These signatures attest to the fact that that the information above on your After Visit Summary has been reviewed and is understood.  Full responsibility of the confidentiality of this discharge information lies with you and/or your care-partner.  

## 2011-04-13 ENCOUNTER — Telehealth: Payer: Self-pay | Admitting: *Deleted

## 2011-04-13 NOTE — Telephone Encounter (Signed)
  Follow up Call-  Call back number 04/12/2011  Post procedure Call Back phone  # [phone]  Permission to leave phone message Yes     Patient questions:  Do you have a fever, pain , or abdominal swelling? no Pain Score  0 *  Have you tolerated food without any problems? yes  Have you been able to return to your normal activities? yes  Do you have any questions about your discharge instructions: Diet   no Medications  no Follow up visit  no  Do you have questions or concerns about your Care? no  Actions: * If pain score is 4 or above: No action needed, pain <4.

## 2011-04-19 ENCOUNTER — Telehealth: Payer: Self-pay | Admitting: Gastroenterology

## 2011-04-19 NOTE — Telephone Encounter (Signed)
Advised pt that she was given that one time rx when Dr Rhea Belton was on ecall and she will have to call her primary care MD for any futher refills, but she has a appt with Dr Jarold Motto on 04/27/2011

## 2011-04-27 ENCOUNTER — Encounter: Payer: Self-pay | Admitting: Gastroenterology

## 2011-04-27 ENCOUNTER — Telehealth: Payer: Self-pay | Admitting: Gastroenterology

## 2011-04-27 ENCOUNTER — Ambulatory Visit (INDEPENDENT_AMBULATORY_CARE_PROVIDER_SITE_OTHER): Payer: Medicare Other | Admitting: Gastroenterology

## 2011-04-27 DIAGNOSIS — R1013 Epigastric pain: Secondary | ICD-10-CM

## 2011-04-27 DIAGNOSIS — K219 Gastro-esophageal reflux disease without esophagitis: Secondary | ICD-10-CM

## 2011-04-27 DIAGNOSIS — R110 Nausea: Secondary | ICD-10-CM

## 2011-04-27 DIAGNOSIS — E66813 Obesity, class 3: Secondary | ICD-10-CM

## 2011-04-27 HISTORY — DX: Morbid (severe) obesity due to excess calories: E66.01

## 2011-04-27 HISTORY — DX: Obesity, class 3: E66.813

## 2011-04-27 HISTORY — DX: Epigastric pain: R10.13

## 2011-04-27 MED ORDER — DEXLANSOPRAZOLE 60 MG PO CPDR: 60.0000 mg | DELAYED_RELEASE_CAPSULE | Freq: Every day | ORAL | Status: DC

## 2011-04-27 NOTE — Patient Instructions (Addendum)
You have been given a separate informational sheet regarding your tobacco use, the importance of quitting and local resources to help you quit. Your procedure has been scheduled for 04/28/2011, please follow the seperate instructions.  Take Dexilant Samples once tablet by mouth once a day.

## 2011-04-27 NOTE — Telephone Encounter (Signed)
Pt advised to take 30 min before breakfast

## 2011-04-27 NOTE — Progress Notes (Signed)
This is a 51 year old Caucasian female recently seen because of rectal bleeding felt secondary to rectal prolapse and solitary rectal ulcer syndrome. This problem seems to have resolved with local anal care. She now has several weeks of persistent epigastric discomfort, nausea, and occasional emesis. She denies hematemesis, melena, or any specific hepatobiliary complaints. She has been using large amounts of Advil because of degenerative arthritis. She is a former alcohol abuser and apparently had pancreatitis in the mid 80s. Ultrasound exam several years ago was unremarkable. Her epigastric pain worse in the morning, it is not improved by Prilosec 20 mg twice a day. She is currently on Phenergan and Imodium. Last endoscopic exam was in 2005. She denies a specific hepatobiliary or systemic complaints.  Current Medications, Allergies, Past Medical History, Past Surgical History, Family History and Social History were reviewed in [employer] record.  Pertinent Review of Systems Negative   Physical Exam: Healthy appearing patient in no acute distress. Blood pressure 118/70 and pulse 88 and regular. BMI elevated to 40.48 I cannot appreciate stigmata of chronic liver disease. Her chest is clear she appears to be in a regular rhythm without murmurs gallops or rubs. I cannot appreciate hepatosplenomegaly, abdominal masses, but there is epigastric tenderness. Bowel sounds are normal. Mental status is clear.    Assessment and Plan: Probable NSAID ulcer, rule out H. pylori infection, rule out possible cholelithiasis. I have scheduled her for an endoscopy ASAP, will stop NSAIDs, prescribed Dexilant 60 mg every morning, and she may possibly need upper abdominal ultrasound exam. She is to continue her local anal care as previously outlined.. Encounter Diagnoses  Name Primary?  . Esophageal reflux   . Nausea

## 2011-04-28 ENCOUNTER — Other Ambulatory Visit: Payer: Self-pay | Admitting: Gastroenterology

## 2011-04-28 ENCOUNTER — Ambulatory Visit (AMBULATORY_SURGERY_CENTER): Payer: Medicare Other | Admitting: Gastroenterology

## 2011-04-28 ENCOUNTER — Encounter: Payer: Self-pay | Admitting: Gastroenterology

## 2011-04-28 DIAGNOSIS — R110 Nausea: Secondary | ICD-10-CM

## 2011-04-28 DIAGNOSIS — A09 Infectious gastroenteritis and colitis, unspecified: Secondary | ICD-10-CM

## 2011-04-28 DIAGNOSIS — K449 Diaphragmatic hernia without obstruction or gangrene: Secondary | ICD-10-CM | POA: Insufficient documentation

## 2011-04-28 DIAGNOSIS — K2960 Other gastritis without bleeding: Secondary | ICD-10-CM

## 2011-04-28 DIAGNOSIS — K219 Gastro-esophageal reflux disease without esophagitis: Secondary | ICD-10-CM

## 2011-04-28 DIAGNOSIS — K2970 Gastritis, unspecified, without bleeding: Secondary | ICD-10-CM

## 2011-04-28 DIAGNOSIS — R05 Cough: Secondary | ICD-10-CM

## 2011-04-28 DIAGNOSIS — R112 Nausea with vomiting, unspecified: Secondary | ICD-10-CM

## 2011-04-28 DIAGNOSIS — R1084 Generalized abdominal pain: Secondary | ICD-10-CM

## 2011-04-28 DIAGNOSIS — R059 Cough, unspecified: Secondary | ICD-10-CM

## 2011-04-28 HISTORY — DX: Gastritis, unspecified, without bleeding: K29.70

## 2011-04-28 MED ORDER — SODIUM CHLORIDE 0.9 % IV SOLN: 500.0000 mL | INTRAVENOUS | Status: DC

## 2011-04-28 NOTE — Progress Notes (Signed)
Patient did not experience any of the following events: a burn prior to discharge; a fall within the facility; wrong site/side/patient/procedure/implant event; or a hospital transfer or hospital admission upon discharge from the facility. (G8907) Patient did not have preoperative order for IV antibiotic SSI prophylaxis. (G8918)  

## 2011-04-28 NOTE — Op Note (Signed)
[facility] [address] [place], [place]  [address]  ENDOSCOPY PROCEDURE REPORT  PATIENT:  [name] [name], [name] [name]  MR#:  [id number] BIRTHDATE:  Feb 17, 1961, 50 yrs. old  GENDER:  female  ENDOSCOPIST:  Vania Rea. Jarold Motto, MD, [facility] Referred by:  PROCEDURE DATE:  04/28/2011 PROCEDURE:  EGD with biopsy, 19147 ASA CLASS:  Class III INDICATIONS:  nausea and vomiting, GERD  MEDICATIONS:   propofol (Diprivan) 80 mg IV, There was residual sedation effect present from prior procedure. TOPICAL ANESTHETIC:  DESCRIPTION OF PROCEDURE:   After the risks and benefits of the procedure were explained, informed consent was obtained.  The LB GIF-H180 [id number] endoscope was introduced through the mouth and advanced to the second portion of the duodenum.  The instrument was slowly withdrawn as the mucosa was fully examined. <<PROCEDUREIMAGES>>  Moderate gastritis was found in the body and the antrum of the stomach. LINEAR ERYTHEMA BIOPSIED.  A hiatal hernia was found. 3 CM. HH NOTED  Normal duodenal folds were noted.  The esophagus and gastroesophageal junction were completely normal in appearance. Retroflexed views revealed a hiatal hernia.    The scope was then withdrawn from the patient and the procedure completed.  COMPLICATIONS:  None  ENDOSCOPIC IMPRESSION: 1) Moderate gastritis in the body and the antrum of the stomach  2) Hiatal hernia 3) Normal duodenal folds 4) Normal esophagus 5) A hiatal hernia 1.CHRONIC GERD 2.R/O H.PYLORI GASTRITIS RECOMMENDATIONS: 1) Await biopsy results 2) Rx CLO if positive 3) continue current medications ULTRASOUND SCHEDULED.R/O CHOLIATHIASIS  ______________________________ Vania Rea. Jarold Motto, MD, [facility]  CC:  n. eSIGNED:   Vania Rea. [name] [name] at 04/28/2011 10:40 AM  Emiliano Dyer, [phone]

## 2011-04-28 NOTE — Patient Instructions (Addendum)

## 2011-04-28 NOTE — Progress Notes (Signed)
Propofol was administered by Mark Smith, CRNA. Maw  The pt tolerated the egd very well. Maw   

## 2011-04-29 ENCOUNTER — Ambulatory Visit (HOSPITAL_COMMUNITY)
Admission: RE | Admit: 2011-04-29 | Discharge: 2011-04-29 | Disposition: A | Discharge status: Home / Self Care | Payer: Medicare Other | Source: Ambulatory Visit | Attending: Gastroenterology | Admitting: Gastroenterology

## 2011-04-29 ENCOUNTER — Telehealth: Payer: Self-pay

## 2011-04-29 DIAGNOSIS — K219 Gastro-esophageal reflux disease without esophagitis: Secondary | ICD-10-CM | POA: Insufficient documentation

## 2011-04-29 DIAGNOSIS — K7689 Other specified diseases of liver: Secondary | ICD-10-CM | POA: Insufficient documentation

## 2011-04-29 DIAGNOSIS — R112 Nausea with vomiting, unspecified: Secondary | ICD-10-CM

## 2011-04-29 NOTE — Telephone Encounter (Signed)
  Follow up Call-  Call back number 04/28/2011 04/12/2011  Post procedure Call Back phone  # [phone]  Permission to leave phone message Yes Yes     Patient questions:  Do you have a fever, pain , or abdominal swelling? no Pain Score  0 *  Have you tolerated food without any problems? yes  Have you been able to return to your normal activities? no  Do you have any questions about your discharge instructions: Diet   no Medications  no Follow up visit  no  Do you have questions or concerns about your Care? no  Actions: * If pain score is 4 or above: No action needed, pain <4.  No problems per the pt.  She was on her way to get the ultra sound. Maw

## 2011-04-30 ENCOUNTER — Telehealth: Payer: Self-pay | Admitting: *Deleted

## 2011-04-30 NOTE — Telephone Encounter (Signed)
Patient given results and recommendations. She will call back after checking her calendar to schedule OV in 2-3 weeks.

## 2011-04-30 NOTE — Telephone Encounter (Signed)
Message copied by Daphine Deutscher on Fri Apr 30, 2011 10:31 AM ------      Message from: Jarold Motto, DAVID R      Created: Thu Apr 29, 2011  9:03 AM       Please called this patient per her normal ultrasound. Continue medications as reviewed at the time of endoscopy. Like to see her back in 2-3 weeks' time if not already scheduled.

## 2011-05-03 ENCOUNTER — Encounter: Payer: Self-pay | Admitting: Gastroenterology

## 2011-05-06 NOTE — Telephone Encounter (Signed)
lmom for pt to call back

## 2011-05-07 NOTE — Telephone Encounter (Signed)
Unable to reach pt by phone; mailed her a letter requesting she call us.

## 2011-05-11 ENCOUNTER — Ambulatory Visit (INDEPENDENT_AMBULATORY_CARE_PROVIDER_SITE_OTHER): Payer: Medicare Other | Admitting: Gastroenterology

## 2011-05-11 ENCOUNTER — Encounter: Payer: Self-pay | Admitting: Gastroenterology

## 2011-05-11 VITALS — BP 120/78 | HR 96 | Ht 65.0 in | Wt 239.0 lb

## 2011-05-11 DIAGNOSIS — R5381 Other malaise: Secondary | ICD-10-CM

## 2011-05-11 DIAGNOSIS — R1011 Right upper quadrant pain: Secondary | ICD-10-CM

## 2011-05-11 DIAGNOSIS — K604 Rectal fistula, unspecified: Secondary | ICD-10-CM

## 2011-05-11 DIAGNOSIS — K603 Anal fistula: Secondary | ICD-10-CM

## 2011-05-11 DIAGNOSIS — R5383 Other fatigue: Secondary | ICD-10-CM

## 2011-05-11 DIAGNOSIS — R69 Illness, unspecified: Secondary | ICD-10-CM

## 2011-05-11 DIAGNOSIS — K5120 Ulcerative (chronic) proctitis without complications: Secondary | ICD-10-CM

## 2011-05-11 DIAGNOSIS — R110 Nausea: Secondary | ICD-10-CM

## 2011-05-11 DIAGNOSIS — K602 Anal fissure, unspecified: Secondary | ICD-10-CM

## 2011-05-11 DIAGNOSIS — K6289 Other specified diseases of anus and rectum: Secondary | ICD-10-CM

## 2011-05-11 DIAGNOSIS — R52 Pain, unspecified: Secondary | ICD-10-CM

## 2011-05-11 DIAGNOSIS — R6889 Other general symptoms and signs: Secondary | ICD-10-CM

## 2011-05-11 HISTORY — DX: Anal fissure, unspecified: K60.2

## 2011-05-11 HISTORY — DX: Rectal fistula: K60.4

## 2011-05-11 HISTORY — DX: Rectal fistula, unspecified: K60.40

## 2011-05-11 MED ORDER — METRONIDAZOLE 500 MG PO TABS: 500.0000 mg | ORAL_TABLET | Freq: Three times a day (TID) | ORAL | Status: AC

## 2011-05-11 MED ORDER — HYDROCODONE-ACETAMINOPHEN 5-500 MG PO TABS: 1.0000 | ORAL_TABLET | Freq: Four times a day (QID) | ORAL | Status: DC | PRN

## 2011-05-11 NOTE — Progress Notes (Signed)
This is a 51 year old Caucasian female with morbid obesity has had recurrent rectal pain and discharge for several months with colonoscopy showing solitary rectal ulcer syndrome confirmed by biopsies. She continues with rectal pain and discharge partially relieved by a variety of salves and creams and Xylocaine. Her nausea is greatly improved on Dexilant 60 mg a day. She now gives a history really is therapy in [place] for" ulcerative colitis." I have no these records for review. Denies fever chills but continues with rectal pain and discharge. She also has diarrhea and uses when necessary Imodium.  Current Medications, Allergies, Past Medical History, Past Surgical History, Family History and Social History were reviewed in [employer] record.  Pertinent Review of Systems Negative   Physical Exam: Pressure 120/78 and pulse 96. BMI 39.77. She is an obese Caucasian female in no acute distress. She has a massive abdomen without any definite organomegaly, masses or tenderness. Inspection of her rectum shows a posterior fissure, right lateral fistula with purulent drainage, and an inflamed tender left lateral external hemorrhoid. Rectal exam is deferred.    Assessment and Plan: I am beginning to think that this patient has rectal Crohn's disease with a recurrent problems, fissuring, and fistulization. I have scheduled CT scan of the lower pelvis and rectal area to exclude a perirectal abscess. I placed her Metronidazole 500 mg 3 times a day, continued use of 5% Xylocaine gel as tolerated, twice a day sitz baths, and never renewed her Vicodin every 6 hours as needed. CBC, CRP, and sed rate ordered. Upper abdominal ultrasound was unremarkable except for fatty liver changes. Suspect this patient will need to start Remicade infusions depending on her workup and clinical course. Encounter Diagnoses  Name Primary?  Marland Kitchen Anal or rectal pain Yes  . UC (ulcerative colitis  confined to rectum)   . General ill feeling   . A vague feeling of discomfort    . Fatigue   . Morbid obesity   . Abdominal pain, right upper quadrant   . Burning pain    . Nausea

## 2011-05-11 NOTE — Patient Instructions (Signed)
You have been scheduled for a CT scan  pelvis at [facility] ([address]---this is in the same building as Architectural technologist).    You are scheduled on 05/12/11 at 930 am . You should arrive 15 minutes prior to your appointment time for registration. Please follow the written instructions below on the day of your exam:  WARNING: IF YOU ARE ALLERGIC TO IODINE/X-RAY DYE, PLEASE NOTIFY RADIOLOGY IMMEDIATELY AT [phone]! YOU WILL BE GIVEN A 13 HOUR PREMEDICATION PREP.  1) Do not eat or drink anything after 530 am  (4 hours prior to your test) 2) You have been given 2 bottles of oral contrast to drink. The solution may taste better if refrigerated, but do NOT add ice or any other liquid to this solution. Shake well before drinking.    Drink 1 bottle of contrast @ 730 am  (2 hours prior to your exam)  Drink 1 bottle of contrast @ 830 am  (1 hour prior to your exam)  You may take any medications as prescribed with a small amount of water except for the following: Metformin, Glucophage, Glucovance, Avandamet, Riomet, Fortamet, Actoplus Met, Janumet, Glumetza or Metaglip. The above medications must be held the day of the exam AND 48 hours after the exam.  The purpose of you drinking the oral contrast is to aid in the visualization of your intestinal tract. The contrast solution may cause some diarrhea. Before your exam is started, you will be given a small amount of fluid to drink. Depending on your individual set of symptoms, you may also receive an intravenous injection of x-ray contrast/dye. Plan on being at [facility] for 30 minutes or long, depending on the type of exam you are having performed.  If you have any questions regarding your exam or if you need to reschedule, you may call the CT department at [phone] between the hours of 8:00 am and 5:00 pm, Monday-Friday.  You have been given a prescription refill for your Vicodin. Please continue Sitz baths twice  daily. We have sent a prescription for Flagyl to your pharmacy. Please have labs done today at the basement lab when you leave today. ________________________________________________________________________

## 2011-05-12 ENCOUNTER — Inpatient Hospital Stay: Admission: RE | Admit: 2011-05-12 | Payer: Medicare Other | Source: Ambulatory Visit

## 2011-05-12 ENCOUNTER — Telehealth: Payer: Self-pay | Admitting: Gastroenterology

## 2011-05-12 NOTE — Telephone Encounter (Signed)
Pt just wanted to inform us she will have her labs and CT scan done tomorrow. She was nauseated this am and didn't feel like having labs done either.

## 2011-05-13 ENCOUNTER — Ambulatory Visit (INDEPENDENT_AMBULATORY_CARE_PROVIDER_SITE_OTHER)
Admission: RE | Admit: 2011-05-13 | Discharge: 2011-05-13 | Disposition: A | Discharge status: Home / Self Care | Payer: Medicare Other | Source: Ambulatory Visit | Attending: Cardiology | Admitting: Cardiology

## 2011-05-13 ENCOUNTER — Telehealth: Payer: Self-pay | Admitting: Gastroenterology

## 2011-05-13 DIAGNOSIS — R5381 Other malaise: Secondary | ICD-10-CM

## 2011-05-13 DIAGNOSIS — R52 Pain, unspecified: Secondary | ICD-10-CM

## 2011-05-13 DIAGNOSIS — K6289 Other specified diseases of anus and rectum: Secondary | ICD-10-CM

## 2011-05-13 DIAGNOSIS — R5383 Other fatigue: Secondary | ICD-10-CM

## 2011-05-13 DIAGNOSIS — R1011 Right upper quadrant pain: Secondary | ICD-10-CM

## 2011-05-13 DIAGNOSIS — K5120 Ulcerative (chronic) proctitis without complications: Secondary | ICD-10-CM

## 2011-05-13 DIAGNOSIS — R6889 Other general symptoms and signs: Secondary | ICD-10-CM

## 2011-05-13 DIAGNOSIS — R110 Nausea: Secondary | ICD-10-CM

## 2011-05-13 DIAGNOSIS — R69 Illness, unspecified: Secondary | ICD-10-CM

## 2011-05-13 MED ORDER — IOHEXOL 300 MG/ML  SOLN
80.0000 mL | Freq: Once | INTRAMUSCULAR | Status: AC | PRN
  Administered 2011-05-13: 80 mL via INTRAVENOUS

## 2011-05-13 NOTE — Telephone Encounter (Signed)
Pt wants [place] to know she has been eating a lot of popcorn and someone told her not to eat it. Informed pt popcorn is one of the foods that is harmful for pt with diverticulosis, which she has. Pt stated understanding and will get labs drawn tomorrow.

## 2011-05-14 ENCOUNTER — Other Ambulatory Visit (INDEPENDENT_AMBULATORY_CARE_PROVIDER_SITE_OTHER): Payer: Medicare Other

## 2011-05-14 DIAGNOSIS — R5383 Other fatigue: Secondary | ICD-10-CM

## 2011-05-14 DIAGNOSIS — R110 Nausea: Secondary | ICD-10-CM

## 2011-05-14 DIAGNOSIS — R52 Pain, unspecified: Secondary | ICD-10-CM

## 2011-05-14 DIAGNOSIS — R6889 Other general symptoms and signs: Secondary | ICD-10-CM

## 2011-05-14 DIAGNOSIS — R69 Illness, unspecified: Secondary | ICD-10-CM

## 2011-05-14 DIAGNOSIS — R1011 Right upper quadrant pain: Secondary | ICD-10-CM

## 2011-05-14 DIAGNOSIS — K6289 Other specified diseases of anus and rectum: Secondary | ICD-10-CM

## 2011-05-14 DIAGNOSIS — R5381 Other malaise: Secondary | ICD-10-CM

## 2011-05-14 DIAGNOSIS — K5120 Ulcerative (chronic) proctitis without complications: Secondary | ICD-10-CM

## 2011-05-14 LAB — BASIC METABOLIC PANEL
CO2: 26 mEq/L (ref 19–32)
Calcium: 9.6 mg/dL (ref 8.4–10.5)
GFR: 54.11 mL/min — ABNORMAL LOW (ref >60.00)
Glucose, Bld: 133 mg/dL — ABNORMAL HIGH (ref 70–99)
Potassium: 4.5 mEq/L (ref 3.5–5.1)
Sodium: 137 mEq/L (ref 135–145)

## 2011-05-14 LAB — CBC WITH DIFFERENTIAL/PLATELET
Basophils Absolute: 0.1 10*3/uL (ref 0.0–0.1)
Eosinophils Absolute: 0.1 10*3/uL (ref 0.0–0.7)
HCT: 38 % (ref 36.0–46.0)
Hemoglobin: 13 g/dL (ref 12.0–15.0)
Lymphs Abs: 2.9 10*3/uL (ref 0.7–4.0)
MCHC: 34.2 g/dL (ref 30.0–36.0)
Monocytes Absolute: 0.4 10*3/uL (ref 0.1–1.0)
Neutro Abs: 4.6 10*3/uL (ref 1.4–7.7)
Platelets: 306 10*3/uL (ref 150.0–400.0)
RDW: 18.3 % — ABNORMAL HIGH (ref 11.5–14.6)

## 2011-05-14 LAB — FOLATE: Folate: 6.7 ng/mL (ref >5.9)

## 2011-05-14 LAB — HEPATIC FUNCTION PANEL
AST: 97 U/L — ABNORMAL HIGH (ref 0–37)
Alkaline Phosphatase: 76 U/L (ref 39–117)
Bilirubin, Direct: 0.2 mg/dL (ref 0.0–0.3)
Total Protein: 7.7 g/dL (ref 6.0–8.3)

## 2011-05-14 LAB — VITAMIN B12: Vitamin B-12: 318 pg/mL (ref 211–911)

## 2011-05-17 LAB — FERRITIN: Ferritin: 249 ng/mL (ref 10.0–291.0)

## 2011-05-20 ENCOUNTER — Telehealth: Payer: Self-pay | Admitting: Gastroenterology

## 2011-05-20 MED ORDER — HYDROCODONE-ACETAMINOPHEN 5-500 MG PO TABS: 1.0000 | ORAL_TABLET | Freq: Four times a day (QID) | ORAL | Status: DC | PRN

## 2011-05-20 NOTE — Telephone Encounter (Signed)
rx sent to pharmacy, pt advised and she will call back on Monday.

## 2011-05-20 NOTE — Telephone Encounter (Signed)
Patient is still taking her Flagyl sometimes only two a day. She would like a refill of her vicodin she is needing to take them every 6 hours and she only had 7.5 days worth since she only had #30. She is having such severe rectal pain, she is using the lidocaine ointment but it only help for a very short term and then she has diarrhea and the pain is severe again.

## 2011-05-20 NOTE — Telephone Encounter (Signed)
OK to refill her Vicoden #40, and ask her take some immodium for diarrhea if she not already on an antidiarrheal. Sitz baths twice  Daily if not doing,call Monday for follow up with Dr. Jarold Motto

## 2011-05-25 ENCOUNTER — Ambulatory Visit: Payer: Medicare Other | Admitting: Gastroenterology

## 2011-06-07 NOTE — Telephone Encounter (Signed)
Will forward to [employer], [place]. Think this was sent to me in error??

## 2011-06-17 ENCOUNTER — Encounter: Payer: Self-pay | Admitting: *Deleted

## 2011-06-22 ENCOUNTER — Ambulatory Visit: Payer: Medicare Other | Admitting: Gastroenterology

## 2011-08-12 ENCOUNTER — Telehealth: Payer: Self-pay | Admitting: Gastroenterology

## 2011-08-12 MED ORDER — PROMETHAZINE HCL 25 MG RE SUPP: 25.0000 mg | Freq: Three times a day (TID) | RECTAL | Status: DC | PRN

## 2011-08-12 NOTE — Telephone Encounter (Signed)
I did not see Dr Norval Gable response and Mike Gip, PA had approved Phenergan for pt. Informed pt she must make a f/u appt to see Dr Jarold Motto; pt stated understanding.

## 2011-08-12 NOTE — Telephone Encounter (Signed)
We have filled this in the past; ok to refill Phenergan r/t nausea? Thanks.

## 2011-08-12 NOTE — Telephone Encounter (Signed)
Ov first?

## 2011-12-02 ENCOUNTER — Telehealth: Payer: Self-pay | Admitting: Gastroenterology

## 2011-12-02 NOTE — Telephone Encounter (Signed)
Pt is having some pain from her hemorrhoids and wants to know what she can take for pain. Pt has some lidocaine ointment. Let pt know she can use that, take motrin, use tucks and soak in a warm tub. Let pt know to call if no improvement. Pt verbalized understanding.

## 2011-12-07 ENCOUNTER — Telehealth: Payer: Self-pay | Admitting: Gastroenterology

## 2011-12-07 NOTE — Telephone Encounter (Signed)
Pt had called last week for hemorrhoid pain and diarrhea has been taking ibuprofen for the pain, Imodium for the diarrhea with no help.pt given an appt on 12/10/11 with Mike Gip, PA.

## 2011-12-10 ENCOUNTER — Ambulatory Visit: Payer: Medicare Other | Admitting: Physician Assistant

## 2011-12-21 ENCOUNTER — Telehealth: Payer: Self-pay | Admitting: Gastroenterology

## 2011-12-21 MED ORDER — PROMETHAZINE HCL 25 MG PO TABS: 25.0000 mg | ORAL_TABLET | Freq: Three times a day (TID) | ORAL | Status: DC | PRN

## 2011-12-21 NOTE — Telephone Encounter (Signed)
Patient is scheduled to see you on Friday. She wants to know if she can have some more phenergan for her terrible nausea. She left her phenergan and her old place or resident and would like some more before her appt or some suppositories. Can she have something or does she need to wait til her appt?

## 2011-12-21 NOTE — Telephone Encounter (Signed)
Prescription sent to patient's pharmacy and pt notified.

## 2011-12-21 NOTE — Telephone Encounter (Signed)
Phenergan 25 mg tablets every 8 hours when necessary #25 with no refills.

## 2011-12-24 ENCOUNTER — Ambulatory Visit (INDEPENDENT_AMBULATORY_CARE_PROVIDER_SITE_OTHER): Payer: Medicare Other | Admitting: Gastroenterology

## 2011-12-24 ENCOUNTER — Encounter: Payer: Self-pay | Admitting: Gastroenterology

## 2011-12-24 VITALS — BP 118/80 | HR 90 | Ht 65.0 in | Wt 217.0 lb

## 2011-12-24 DIAGNOSIS — K649 Unspecified hemorrhoids: Secondary | ICD-10-CM

## 2011-12-24 DIAGNOSIS — K760 Fatty (change of) liver, not elsewhere classified: Secondary | ICD-10-CM

## 2011-12-24 DIAGNOSIS — K625 Hemorrhage of anus and rectum: Secondary | ICD-10-CM

## 2011-12-24 DIAGNOSIS — K7689 Other specified diseases of liver: Secondary | ICD-10-CM

## 2011-12-24 DIAGNOSIS — K6289 Other specified diseases of anus and rectum: Secondary | ICD-10-CM

## 2011-12-24 DIAGNOSIS — R197 Diarrhea, unspecified: Secondary | ICD-10-CM

## 2011-12-24 MED ORDER — HYDROCORTISONE ACETATE 25 MG RE SUPP
25.0000 mg | Freq: Every day | RECTAL | Status: DC
Start: 2011-12-24 — End: 2012-08-12

## 2011-12-24 NOTE — Progress Notes (Addendum)
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 Previous colonoscopy showed evidence of solitary rectal ulcer syndrome , but otherwise was unremarkable without evidence of ileitis or colitis.. Patient also has GERD and is on Dexilant 60 mg a day. She continues with 3-4 loose bowel movements a day without rectal bleeding. She is a very complex medical history and is on multiple medications including when necessary hydrocodone and Imodium. Patient has been on frequent antibiotics over the last year because of recurrent urinary tract infections. She's had some mild weight loss, but continues to be obese. She denies other systemic complaints, history of arthritis, skin rashes, oral stomatitis, or any previous diagnosis of inflammatory bowel disease.  Current Medications, Allergies, Past Medical History, Past Surgical History, Family History and Social History were reviewed in [employer] record.  Pertinent Review of Systems Negative   Physical Exam: Healthy but anxious-appearing patient in no acute distress. Blood pressure 118/80, pulse 90 and regular, and weight 217 pounds the BMI of 36.11. Abdomen shows no organomegaly, masses or tenderness. Bowel sounds are normal. Inspection of her rectum shows some perianal tags but otherwise unremarkable. Rectal exam shows no masses or tenderness with soft stool which is guaiac negative.  Anoscopy: Posterior lateral and posterior medial internal hemorrhoids visualized. I cannot see a definite rectal ulceration or any evidence of mucosa with abnormalities otherwise.    Assessment and Plan: Chronic diarrhea perhaps related to PPI therapy, rule out C. difficile infection, rule out inflammatory bowel disease. I have stopped her Dexilant completely, have described Canasa 1 g suppositories at bedtime, and we'll check IBD serology, sedimentation rate, and CRP. Stool for C. difficile exam  ordered. This patient may need followup flexible sigmoidoscopy. Last colonoscopy in February of this year. She does have a history of fatty liver but liver function tests  Have been normal. Encounter Diagnoses  Name Primary?  . Rectal bleeding Yes  . Rectal pain   . Hemorrhoids

## 2011-12-24 NOTE — Patient Instructions (Addendum)
Stop taking your Dexilant.  Your physician has requested that you go to the basement for  lab work before leaving today.  We have sent the following medications to your pharmacy for you to pick up at your convenience: Anusol Suppositories.

## 2011-12-27 ENCOUNTER — Other Ambulatory Visit (INDEPENDENT_AMBULATORY_CARE_PROVIDER_SITE_OTHER): Payer: Medicare Other

## 2011-12-27 DIAGNOSIS — K6289 Other specified diseases of anus and rectum: Secondary | ICD-10-CM

## 2011-12-27 DIAGNOSIS — K625 Hemorrhage of anus and rectum: Secondary | ICD-10-CM

## 2011-12-27 DIAGNOSIS — K649 Unspecified hemorrhoids: Secondary | ICD-10-CM

## 2011-12-27 LAB — CBC WITH DIFFERENTIAL/PLATELET
Basophils Relative: 0.6 % (ref 0.0–3.0)
Eosinophils Absolute: 0.1 10*3/uL (ref 0.0–0.7)
Eosinophils Relative: 1 % (ref 0.0–5.0)
Lymphocytes Relative: 34.6 % (ref 12.0–46.0)
MCHC: 33.1 g/dL (ref 30.0–36.0)
Monocytes Relative: 8.9 % (ref 3.0–12.0)
Neutrophils Relative %: 54.9 % (ref 43.0–77.0)
RBC: 3.63 Mil/uL — ABNORMAL LOW (ref 3.87–5.11)
WBC: 5.8 10*3/uL (ref 4.5–10.5)

## 2011-12-27 LAB — C-REACTIVE PROTEIN: CRP: <0.5 mg/dL (ref 0.5–20.0)

## 2011-12-28 ENCOUNTER — Other Ambulatory Visit: Payer: Medicare Other

## 2011-12-28 DIAGNOSIS — K649 Unspecified hemorrhoids: Secondary | ICD-10-CM

## 2011-12-28 DIAGNOSIS — K625 Hemorrhage of anus and rectum: Secondary | ICD-10-CM

## 2011-12-28 DIAGNOSIS — K6289 Other specified diseases of anus and rectum: Secondary | ICD-10-CM

## 2011-12-30 LAB — IBD EXPANDED PANEL
ACCA: 10 units (ref 0–90)
ALCA: 19 units (ref 0–60)
Atypical pANCA: NEGATIVE
gASCA: 8 units (ref 0–50)

## 2012-01-03 ENCOUNTER — Telehealth: Payer: Self-pay | Admitting: Gastroenterology

## 2012-01-03 DIAGNOSIS — R945 Abnormal results of liver function studies: Secondary | ICD-10-CM

## 2012-01-03 NOTE — Telephone Encounter (Signed)
Informed pt of her lab results and the need for f/u lfts and Hemochromatosis labs. Pt stated understanding and she may leave for [facility], but we can call her cell phone.i

## 2012-01-10 ENCOUNTER — Other Ambulatory Visit: Payer: Self-pay | Admitting: *Deleted

## 2012-01-10 ENCOUNTER — Other Ambulatory Visit (INDEPENDENT_AMBULATORY_CARE_PROVIDER_SITE_OTHER): Payer: Medicare Other

## 2012-01-10 DIAGNOSIS — R945 Abnormal results of liver function studies: Secondary | ICD-10-CM

## 2012-01-10 DIAGNOSIS — K5190 Ulcerative colitis, unspecified, without complications: Secondary | ICD-10-CM

## 2012-01-10 DIAGNOSIS — R7989 Other specified abnormal findings of blood chemistry: Secondary | ICD-10-CM

## 2012-01-10 LAB — HEPATIC FUNCTION PANEL
ALT: 54 U/L — ABNORMAL HIGH (ref 0–35)
Bilirubin, Direct: 0.3 mg/dL (ref 0.0–0.3)
Total Protein: 7.4 g/dL (ref 6.0–8.3)

## 2012-01-11 ENCOUNTER — Other Ambulatory Visit: Payer: Self-pay | Admitting: *Deleted

## 2012-01-11 DIAGNOSIS — R7989 Other specified abnormal findings of blood chemistry: Secondary | ICD-10-CM

## 2012-01-14 ENCOUNTER — Telehealth: Payer: Self-pay | Admitting: *Deleted

## 2012-01-14 NOTE — Telephone Encounter (Signed)
Pt reports she forgot to get her labs before she left for [facility] and she wonders if she is contagious. Informed her the Hemachromatosis was negative, but Dr Jarold Motto is trying to see why her LFTs are abnormal. He will be doing labs to see if she has Hepatitis. She asked if it's OK to be around her relatives and I instructed her to follow good hygiene and good hand washing. She should refrain from drinking or eating after someone or vice versa and she can go to the extreme and eat on paper plates with plastic utensils she can throw away; pt stated understanding.

## 2012-03-22 ENCOUNTER — Other Ambulatory Visit: Payer: Self-pay | Admitting: Gastroenterology

## 2012-04-14 ENCOUNTER — Ambulatory Visit: Payer: Medicare Other | Admitting: Internal Medicine

## 2012-04-14 DIAGNOSIS — Z0289 Encounter for other administrative examinations: Secondary | ICD-10-CM

## 2012-06-06 ENCOUNTER — Telehealth: Payer: Self-pay | Admitting: Gastroenterology

## 2012-06-06 NOTE — Telephone Encounter (Signed)
Duplicate encounter

## 2012-06-06 NOTE — Telephone Encounter (Signed)
Pt has been vomiting for a week, but worse the last 4 days. Offered an appt tomorrow, but she can't make it. Pt will see Willette Cluster, NP on 06/08/12. Pt has been helping with her disabled brother in [place] and was gone from Thanksgiving to mid February and never had labs done that dr Jarold Motto ordered in November, 2013; she will have them done prior to her appt.

## 2012-06-08 ENCOUNTER — Other Ambulatory Visit (INDEPENDENT_AMBULATORY_CARE_PROVIDER_SITE_OTHER): Payer: Medicare Other

## 2012-06-08 ENCOUNTER — Encounter: Payer: Self-pay | Admitting: Nurse Practitioner

## 2012-06-08 ENCOUNTER — Ambulatory Visit (INDEPENDENT_AMBULATORY_CARE_PROVIDER_SITE_OTHER): Payer: Medicare Other | Admitting: Nurse Practitioner

## 2012-06-08 ENCOUNTER — Telehealth: Payer: Self-pay | Admitting: Gastroenterology

## 2012-06-08 VITALS — BP 126/86 | HR 111 | Ht 64.75 in | Wt 201.0 lb

## 2012-06-08 DIAGNOSIS — K219 Gastro-esophageal reflux disease without esophagitis: Secondary | ICD-10-CM

## 2012-06-08 DIAGNOSIS — R197 Diarrhea, unspecified: Secondary | ICD-10-CM

## 2012-06-08 DIAGNOSIS — R7989 Other specified abnormal findings of blood chemistry: Secondary | ICD-10-CM

## 2012-06-08 LAB — CBC WITH DIFFERENTIAL/PLATELET
Eosinophils Relative: 0.3 % (ref 0.0–5.0)
HCT: 39.2 % (ref 36.0–46.0)
Monocytes Relative: 5.5 % (ref 3.0–12.0)
Neutrophils Relative %: 78.2 % — ABNORMAL HIGH (ref 43.0–77.0)
Platelets: 248 10*3/uL (ref 150.0–400.0)
RBC: 3.38 Mil/uL — ABNORMAL LOW (ref 3.87–5.11)
WBC: 6.9 10*3/uL (ref 4.5–10.5)

## 2012-06-08 LAB — LIPID PANEL
Cholesterol: 375 mg/dL — ABNORMAL HIGH (ref 0–200)
HDL: 155.8 mg/dL (ref >39.00)
VLDL: 24.4 mg/dL (ref 0.0–40.0)

## 2012-06-08 LAB — HEPATITIS C ANTIBODY: HCV Ab: NEGATIVE

## 2012-06-08 LAB — BASIC METABOLIC PANEL
CO2: 25 mEq/L (ref 19–32)
Glucose, Bld: 188 mg/dL — ABNORMAL HIGH (ref 70–99)
Potassium: 3.7 mEq/L (ref 3.5–5.1)
Sodium: 136 mEq/L (ref 135–145)

## 2012-06-08 MED ORDER — MESALAMINE 1000 MG RE SUPP: 1000.0000 mg | Freq: Every day | RECTAL | Status: DC

## 2012-06-08 MED ORDER — PROMETHAZINE HCL 12.5 MG RE SUPP: 12.5000 mg | RECTAL | Status: DC | PRN

## 2012-06-08 NOTE — Telephone Encounter (Signed)
Pt called for lab results and we discussed her CBC and BMET; she was worried about her sodium and potassium levels. We also discussed her high glucose level and she may need to see her PCP. She wants to know if a Probiotic is OK to take and she will take one and also supplement her diet with yogurts that contain a probiotic. She will call back for further questions.

## 2012-06-08 NOTE — Patient Instructions (Addendum)
Please follow up with Dr Jarold Motto in 3 weeks. Your physician has requested that you go to the basement for the following lab work before leaving today: stool studies, BMET and CBC We have sent the following medications to your pharmacy for you to pick up at your convenience: phenergan and canasa  Use imodium over the counter twice daily as needed. CC:  Della Goo MD

## 2012-06-08 NOTE — Progress Notes (Signed)
06/08/2012 [name] [name] [id number] 09/21/1960   History of Present Illness:  Patient is a 52 year old female known to Dr. Jarold Motto. She has a history of a solitary rectal ulcer. She also has chronic diarrhea felt at one time to be related to proton pump inhibitors. At her last visit in 2013 patient was started on Canasa suppositories. Workup for inflammatory bowel disease was undertaken, stool studies ordered. IBD labs were negative.  Stool for C. difficile was negative. Her diarrhea did eventualy resolve  Patient worked in today for recurrent diarrhea. She is also having nausea and vomiting. Patient under a lot of stress with handicpped brother and feels this is contributing to her symptoms. Sinus drainage causing her to gag which leads to vomiting. She was on Lorazepam at one time but didn' like the was it made her feel so she stopped it.  Current Medications, Allergies, Past Medical History, Past Surgical History, Family History and Social History were reviewed in [employer] record.   Physical Exam: General: Well developed , white female in no acute distress Head: Normocephalic and atraumatic Eyes:  sclerae anicteric, conjunctiva pink  Ears: Normal auditory acuity Lungs: Clear throughout to auscultation Heart: Regular rate and rhythm Abdomen: Soft, non tender and non distended. No masses, no hepatomegaly. Normal bowel sounds Rectal:  No stool vault Musculoskeletal: Symmetrical with no gross deformities  Extremities: No edema  Neurological: Alert oriented x 4, grossly nonfocal Psychological:  Alert and cooperative. Normal mood and affect  Assessment and Recommendations:   1. nuasea and vomiting, possibly due, at least in part, to sinus drainage which causea her to gag and then vomit. Needs phenergan suppositories refilled. Once vomiting better she should should be able to hold down allergy meds.   2. Diarrhea, chronic intermittent. IBD workup negative. She  has 4-5 small volume stools a day. She is worried about parasites[place] We can certainly check stool studies but given intermittent nature this does seem unlikely. She is taking only one Immodium a day. We prescribed Canasa suppositories in the past and they were helpful. Will repeat a course of Canasa suppositories at night. .   3. Anxiety. She could probably benefit from taking the anxiolytic prescirbied earlier. She has a mental health visit coming up soon. Patient sees a correlation between stress and increased diarrhea.

## 2012-06-10 LAB — ANTI-SMOOTH MUSCLE/MITOCHOND.: Mitochondrial Ab: 4.5 Units (ref 0.0–20.0)

## 2012-06-13 ENCOUNTER — Telehealth: Payer: Self-pay | Admitting: *Deleted

## 2012-06-13 DIAGNOSIS — K7689 Other specified diseases of liver: Secondary | ICD-10-CM

## 2012-06-13 DIAGNOSIS — R112 Nausea with vomiting, unspecified: Secondary | ICD-10-CM

## 2012-06-13 DIAGNOSIS — Z3009 Encounter for other general counseling and advice on contraception: Secondary | ICD-10-CM

## 2012-06-13 DIAGNOSIS — R5381 Other malaise: Secondary | ICD-10-CM

## 2012-06-13 DIAGNOSIS — R630 Anorexia: Secondary | ICD-10-CM

## 2012-06-13 DIAGNOSIS — R69 Illness, unspecified: Secondary | ICD-10-CM

## 2012-06-13 DIAGNOSIS — K5120 Ulcerative (chronic) proctitis without complications: Secondary | ICD-10-CM

## 2012-06-13 DIAGNOSIS — R6889 Other general symptoms and signs: Secondary | ICD-10-CM

## 2012-06-13 DIAGNOSIS — K6289 Other specified diseases of anus and rectum: Secondary | ICD-10-CM

## 2012-06-13 DIAGNOSIS — D649 Anemia, unspecified: Secondary | ICD-10-CM

## 2012-06-13 DIAGNOSIS — R110 Nausea: Secondary | ICD-10-CM

## 2012-06-13 DIAGNOSIS — K921 Melena: Secondary | ICD-10-CM

## 2012-06-13 DIAGNOSIS — R7989 Other specified abnormal findings of blood chemistry: Secondary | ICD-10-CM

## 2012-06-13 DIAGNOSIS — R52 Pain, unspecified: Secondary | ICD-10-CM

## 2012-06-13 DIAGNOSIS — E538 Deficiency of other specified B group vitamins: Secondary | ICD-10-CM

## 2012-06-13 DIAGNOSIS — R5383 Other fatigue: Secondary | ICD-10-CM

## 2012-06-13 DIAGNOSIS — R718 Other abnormality of red blood cells: Secondary | ICD-10-CM

## 2012-06-13 NOTE — Telephone Encounter (Signed)
Message copied by Florene Glen on Tue Jun 13, 2012  2:29 PM ------      Message from: HUNT, [place] R      Created: Mon Jun 12, 2012  5:00 PM       FYI ------

## 2012-06-13 NOTE — Telephone Encounter (Signed)
Notes Recorded by Mardella Layman, MD on 06/12/2012 at 8:19 AM W/U negative.Marland KitchenMarland Kitchenprobable fatty liver    Notes Recorded by Meredith Pel, NP on 06/12/2012 at 8:17 AM Rene Kocher, please let her know labs okay except high MCV. Does she drink? If not, please check B12, folate and TSH. thanks    Informed pt of lab results. She will come in at her convenience to have the labs drawn.

## 2012-06-14 ENCOUNTER — Telehealth: Payer: Self-pay | Admitting: Gastroenterology

## 2012-06-15 ENCOUNTER — Ambulatory Visit: Payer: Medicare Other | Admitting: Gastroenterology

## 2012-06-15 NOTE — Telephone Encounter (Signed)
lmom for pt to call back

## 2012-06-15 NOTE — Telephone Encounter (Signed)
Pt wants Willette Cluster, NP to know her sister has hypothyroidism and takes Vit B12 and folate; her brother has hyperthyroidism. Pt will have B12, Folate and TSH done per Gunnar Fusi.

## 2012-06-21 ENCOUNTER — Other Ambulatory Visit: Payer: Self-pay | Admitting: Gastroenterology

## 2012-06-26 ENCOUNTER — Encounter: Payer: Self-pay | Admitting: Gastroenterology

## 2012-06-26 NOTE — Telephone Encounter (Signed)
Patient's history is updated for thyroid disease in her brother and sister

## 2012-06-30 ENCOUNTER — Ambulatory Visit: Payer: Medicare Other | Admitting: Gastroenterology

## 2012-07-25 ENCOUNTER — Telehealth: Payer: Self-pay | Admitting: Gastroenterology

## 2012-07-25 NOTE — Telephone Encounter (Signed)
Pt called to discuss her labs from April and asked what to do about her high glucose. Explained to pt we do not manage diabetes and she should see her PCP. Pt stated understanding and will speak to PCP at her appt later this month/ mailed a copy of labs to her per her request. Pt has outstanding labs she will have drawn soon she states.

## 2012-08-12 ENCOUNTER — Emergency Department (HOSPITAL_COMMUNITY): Payer: Medicare Other

## 2012-08-12 ENCOUNTER — Inpatient Hospital Stay (HOSPITAL_COMMUNITY)
Admission: EM | Admit: 2012-08-12 | Discharge: 2012-08-15 | DRG: 439 | Disposition: A | Discharge status: Home / Self Care | Payer: Medicare Other | Attending: Internal Medicine | Admitting: Internal Medicine

## 2012-08-12 ENCOUNTER — Encounter (HOSPITAL_COMMUNITY): Payer: Self-pay | Admitting: *Deleted

## 2012-08-12 DIAGNOSIS — K649 Unspecified hemorrhoids: Secondary | ICD-10-CM

## 2012-08-12 DIAGNOSIS — Z86010 Personal history of colonic polyps: Secondary | ICD-10-CM

## 2012-08-12 DIAGNOSIS — K862 Cyst of pancreas: Secondary | ICD-10-CM

## 2012-08-12 DIAGNOSIS — E785 Hyperlipidemia, unspecified: Secondary | ICD-10-CM | POA: Diagnosis present

## 2012-08-12 DIAGNOSIS — R112 Nausea with vomiting, unspecified: Secondary | ICD-10-CM

## 2012-08-12 DIAGNOSIS — K219 Gastro-esophageal reflux disease without esophagitis: Secondary | ICD-10-CM | POA: Diagnosis present

## 2012-08-12 DIAGNOSIS — R1013 Epigastric pain: Secondary | ICD-10-CM

## 2012-08-12 DIAGNOSIS — R059 Cough, unspecified: Secondary | ICD-10-CM

## 2012-08-12 DIAGNOSIS — K449 Diaphragmatic hernia without obstruction or gangrene: Secondary | ICD-10-CM

## 2012-08-12 DIAGNOSIS — R05 Cough: Secondary | ICD-10-CM

## 2012-08-12 DIAGNOSIS — K859 Acute pancreatitis without necrosis or infection, unspecified: Secondary | ICD-10-CM

## 2012-08-12 DIAGNOSIS — K604 Rectal fistula: Secondary | ICD-10-CM

## 2012-08-12 DIAGNOSIS — K863 Pseudocyst of pancreas: Secondary | ICD-10-CM

## 2012-08-12 DIAGNOSIS — E871 Hypo-osmolality and hyponatremia: Secondary | ICD-10-CM | POA: Diagnosis present

## 2012-08-12 DIAGNOSIS — E119 Type 2 diabetes mellitus without complications: Secondary | ICD-10-CM | POA: Diagnosis present

## 2012-08-12 DIAGNOSIS — K5730 Diverticulosis of large intestine without perforation or abscess without bleeding: Secondary | ICD-10-CM

## 2012-08-12 DIAGNOSIS — K7689 Other specified diseases of liver: Secondary | ICD-10-CM | POA: Diagnosis present

## 2012-08-12 DIAGNOSIS — F319 Bipolar disorder, unspecified: Secondary | ICD-10-CM

## 2012-08-12 DIAGNOSIS — R1084 Generalized abdominal pain: Secondary | ICD-10-CM

## 2012-08-12 DIAGNOSIS — R110 Nausea: Secondary | ICD-10-CM

## 2012-08-12 DIAGNOSIS — K709 Alcoholic liver disease, unspecified: Secondary | ICD-10-CM

## 2012-08-12 DIAGNOSIS — N189 Chronic kidney disease, unspecified: Secondary | ICD-10-CM | POA: Diagnosis present

## 2012-08-12 DIAGNOSIS — K6289 Other specified diseases of anus and rectum: Secondary | ICD-10-CM

## 2012-08-12 DIAGNOSIS — K602 Anal fissure, unspecified: Secondary | ICD-10-CM

## 2012-08-12 DIAGNOSIS — E876 Hypokalemia: Secondary | ICD-10-CM | POA: Diagnosis present

## 2012-08-12 DIAGNOSIS — K626 Ulcer of anus and rectum: Secondary | ICD-10-CM

## 2012-08-12 DIAGNOSIS — I129 Hypertensive chronic kidney disease with stage 1 through stage 4 chronic kidney disease, or unspecified chronic kidney disease: Secondary | ICD-10-CM | POA: Diagnosis present

## 2012-08-12 DIAGNOSIS — F1010 Alcohol abuse, uncomplicated: Secondary | ICD-10-CM | POA: Diagnosis present

## 2012-08-12 DIAGNOSIS — Z8719 Personal history of other diseases of the digestive system: Secondary | ICD-10-CM

## 2012-08-12 DIAGNOSIS — Z79899 Other long term (current) drug therapy: Secondary | ICD-10-CM

## 2012-08-12 DIAGNOSIS — K921 Melena: Secondary | ICD-10-CM

## 2012-08-12 DIAGNOSIS — Z6832 Body mass index (BMI) 32.0-32.9, adult: Secondary | ICD-10-CM

## 2012-08-12 DIAGNOSIS — A09 Infectious gastroenteritis and colitis, unspecified: Secondary | ICD-10-CM

## 2012-08-12 DIAGNOSIS — K5190 Ulcerative colitis, unspecified, without complications: Secondary | ICD-10-CM | POA: Diagnosis present

## 2012-08-12 DIAGNOSIS — F17200 Nicotine dependence, unspecified, uncomplicated: Secondary | ICD-10-CM | POA: Diagnosis present

## 2012-08-12 DIAGNOSIS — E669 Obesity, unspecified: Secondary | ICD-10-CM | POA: Diagnosis present

## 2012-08-12 DIAGNOSIS — K2970 Gastritis, unspecified, without bleeding: Secondary | ICD-10-CM

## 2012-08-12 DIAGNOSIS — R197 Diarrhea, unspecified: Secondary | ICD-10-CM

## 2012-08-12 DIAGNOSIS — F329 Major depressive disorder, single episode, unspecified: Secondary | ICD-10-CM

## 2012-08-12 DIAGNOSIS — R188 Other ascites: Secondary | ICD-10-CM | POA: Diagnosis present

## 2012-08-12 HISTORY — DX: Acute pancreatitis without necrosis or infection, unspecified: K85.90

## 2012-08-12 HISTORY — DX: Pseudocyst of pancreas: K86.3

## 2012-08-12 LAB — COMPREHENSIVE METABOLIC PANEL
Albumin: 3.4 g/dL — ABNORMAL LOW (ref 3.5–5.2)
Alkaline Phosphatase: 149 U/L — ABNORMAL HIGH (ref 39–117)
BUN: 19 mg/dL (ref 6–23)
Calcium: 10 mg/dL (ref 8.4–10.5)
Creatinine, Ser: 1.71 mg/dL — ABNORMAL HIGH (ref 0.50–1.10)
GFR calc Af Amer: 39 mL/min — ABNORMAL LOW (ref >90)
Glucose, Bld: 213 mg/dL — ABNORMAL HIGH (ref 70–99)
Potassium: 4.4 mEq/L (ref 3.5–5.1)
Total Protein: 8.4 g/dL — ABNORMAL HIGH (ref 6.0–8.3)

## 2012-08-12 LAB — URINALYSIS, ROUTINE W REFLEX MICROSCOPIC
Nitrite: NEGATIVE
Protein, ur: 100 mg/dL — AB
Urobilinogen, UA: 0.2 mg/dL (ref 0.0–1.0)

## 2012-08-12 LAB — CBC WITH DIFFERENTIAL/PLATELET
Basophils Relative: 0 % (ref 0–1)
Eosinophils Relative: 0 % (ref 0–5)
HCT: 46 % (ref 36.0–46.0)
Hemoglobin: 15.7 g/dL — ABNORMAL HIGH (ref 12.0–15.0)
MCH: 36.1 pg — ABNORMAL HIGH (ref 26.0–34.0)
MCV: 105.7 fL — ABNORMAL HIGH (ref 78.0–100.0)
Monocytes Absolute: 0.5 10*3/uL (ref 0.1–1.0)
Neutro Abs: 11.2 10*3/uL — ABNORMAL HIGH (ref 1.7–7.7)
Neutrophils Relative %: 90 % — ABNORMAL HIGH (ref 43–77)
RBC: 4.35 MIL/uL (ref 3.87–5.11)

## 2012-08-12 LAB — URINE MICROSCOPIC-ADD ON

## 2012-08-12 LAB — POCT I-STAT TROPONIN I: Troponin i, poc: 0 ng/mL (ref 0.00–0.08)

## 2012-08-12 LAB — LIPASE, BLOOD: Lipase: 1858 U/L — ABNORMAL HIGH (ref 11–59)

## 2012-08-12 MED ORDER — ONDANSETRON HCL 4 MG/2ML IJ SOLN
4.0000 mg | Freq: Once | INTRAMUSCULAR | Status: AC
  Administered 2012-08-12: 4 mg via INTRAVENOUS
  Filled 2012-08-12: qty 2

## 2012-08-12 MED ORDER — LAMOTRIGINE 100 MG PO TABS
100.0000 mg | ORAL_TABLET | Freq: Every day | ORAL | Status: DC
  Administered 2012-08-12 – 2012-08-15 (×4): 100 mg via ORAL
  Filled 2012-08-12 (×4): qty 1

## 2012-08-12 MED ORDER — ARIPIPRAZOLE 15 MG PO TABS
15.0000 mg | ORAL_TABLET | Freq: Every day | ORAL | Status: DC
  Administered 2012-08-12 – 2012-08-15 (×4): 15 mg via ORAL
  Filled 2012-08-12 (×4): qty 1

## 2012-08-12 MED ORDER — SODIUM CHLORIDE 0.9 % IJ SOLN: 3.0000 mL | Freq: Two times a day (BID) | INTRAMUSCULAR | Status: DC

## 2012-08-12 MED ORDER — HYDROMORPHONE HCL PF 1 MG/ML IJ SOLN
0.5000 mg | Freq: Once | INTRAMUSCULAR | Status: AC
  Administered 2012-08-12: 0.5 mg via INTRAVENOUS
  Filled 2012-08-12: qty 1

## 2012-08-12 MED ORDER — FLUOXETINE HCL 20 MG PO TABS
20.0000 mg | ORAL_TABLET | Freq: Two times a day (BID) | ORAL | Status: DC
  Administered 2012-08-12 – 2012-08-15 (×6): 20 mg via ORAL
  Filled 2012-08-12 (×7): qty 1

## 2012-08-12 MED ORDER — IOHEXOL 300 MG/ML  SOLN
100.0000 mL | Freq: Once | INTRAMUSCULAR | Status: AC | PRN
  Administered 2012-08-12: 100 mL via INTRAVENOUS

## 2012-08-12 MED ORDER — HYDROMORPHONE HCL PF 1 MG/ML IJ SOLN
1.0000 mg | Freq: Once | INTRAMUSCULAR | Status: AC
  Administered 2012-08-12: 1 mg via INTRAVENOUS
  Filled 2012-08-12: qty 1

## 2012-08-12 MED ORDER — FLUTICASONE PROPIONATE 50 MCG/ACT NA SUSP
2.0000 | Freq: Every day | NASAL | Status: DC
  Administered 2012-08-12 – 2012-08-15 (×4): 2 via NASAL
  Filled 2012-08-12: qty 16

## 2012-08-12 MED ORDER — SODIUM CHLORIDE 0.9 % IV SOLN
INTRAVENOUS | Status: DC
  Administered 2012-08-12 – 2012-08-14 (×4): via INTRAVENOUS

## 2012-08-12 MED ORDER — MORPHINE SULFATE 4 MG/ML IJ SOLN
4.0000 mg | Freq: Once | INTRAMUSCULAR | Status: AC
  Administered 2012-08-12: 4 mg via INTRAVENOUS
  Filled 2012-08-12: qty 1

## 2012-08-12 MED ORDER — SODIUM CHLORIDE 0.9 % IV BOLUS (SEPSIS)
1000.0000 mL | Freq: Once | INTRAVENOUS | Status: AC
  Administered 2012-08-12: 1000 mL via INTRAVENOUS

## 2012-08-12 MED ORDER — ONDANSETRON HCL 4 MG/2ML IJ SOLN
4.0000 mg | Freq: Three times a day (TID) | INTRAMUSCULAR | Status: AC | PRN
  Administered 2012-08-12 – 2012-08-13 (×2): 4 mg via INTRAVENOUS
  Filled 2012-08-12 (×2): qty 2

## 2012-08-12 MED ORDER — IOHEXOL 300 MG/ML  SOLN
50.0000 mL | Freq: Once | INTRAMUSCULAR | Status: AC | PRN
  Administered 2012-08-12: 50 mL via ORAL

## 2012-08-12 MED ORDER — ENOXAPARIN SODIUM 40 MG/0.4ML ~~LOC~~ SOLN
40.0000 mg | SUBCUTANEOUS | Status: DC
  Administered 2012-08-13 – 2012-08-14 (×2): 40 mg via SUBCUTANEOUS
  Filled 2012-08-12 (×4): qty 0.4

## 2012-08-12 MED ORDER — HYDROMORPHONE HCL PF 1 MG/ML IJ SOLN
1.0000 mg | INTRAMUSCULAR | Status: DC | PRN
  Administered 2012-08-12 – 2012-08-13 (×2): 1 mg via INTRAVENOUS
  Filled 2012-08-12 (×3): qty 1

## 2012-08-12 NOTE — ED Notes (Signed)
US at bedside

## 2012-08-12 NOTE — ED Provider Notes (Signed)
History     CSN: [id number]  Arrival date & time 08/12/12  1009   First MD Initiated Contact with Patient 08/12/12 1025      Chief Complaint  Patient presents with  . Abdominal Pain  . Emesis    (Consider location/radiation/quality/duration/timing/severity/associated sxs/prior treatment) HPI Comments: Patient is a 52 year old female with an extensive past medical history including bipolar disorder, pre-diabetes, hypertension who presents with a 3 day history of abdominal pain. The pain is located in her epigastrium and radiates around to her right flank. The pain is described as aching and severe. The pain started gradually and progressively worsened since the onset. No alleviating/aggravating factors. The patient has tried tylenol for symptoms without relief. Associated symptoms include NVD. Patient denies fever, headache, chest pain, SOB, dysuria, constipation, abnormal vaginal bleeding/discharge. Patient was seen at her PCP office this morning who recommended she come to the ED due to tachycardia, low BP and elevated blood glucose.     Patient is a 52 y.o. female presenting with abdominal pain and vomiting.  Abdominal Pain Associated symptoms include abdominal pain, nausea and vomiting.  Emesis Associated symptoms: abdominal pain and diarrhea     Past Medical History  Diagnosis Date  . Personal history of colonic polyps 10/16/2009    hyperplastic  . Diverticulosis of colon (without mention of hemorrhage)   . Esophageal reflux   . Ulcerative colitis, unspecified   . Depressive disorder, not elsewhere classified   . Bipolar disorder   . Hiatal hernia   . Pre-diabetes   . Hypertension   . Anemia   . Hyperlipidemia   . Chronic kidney disease   . Anal fistula   . Obesity   . Iron deficiency     Past Surgical History  Procedure Laterality Date  . Finger amputation  2010    right index  . Knee arthroscopy      bilateral, left x2  . Hemorroidectomy    . Total abdominal  hysterectomy      Family History  Problem Relation Age of Onset  . Irritable bowel syndrome Mother   . Ovarian cancer Maternal Aunt   . Diabetes Father   . Heart attack Father   . Hypertension Father   . Hypertension Mother   . Thyroid disease Sister   . Thyroid disease Brother     History  Substance Use Topics  . Smoking status: Current Every Day Smoker    Types: Cigarettes  . Smokeless tobacco: Never Used     Comment: pt states she smokes 1-2 cigarettes/ day  . Alcohol Use: No    OB History   Grav Para Term Preterm Abortions TAB SAB Ect Mult Living                  Review of Systems  Gastrointestinal: Positive for nausea, vomiting, abdominal pain and diarrhea.  Genitourinary: Positive for flank pain.  All other systems reviewed and are negative.    Allergies  Review of patient's allergies indicates no known allergies.  Home Medications   Current Outpatient Rx  Name  Route  Sig  Dispense  Refill  . ARIPiprazole (ABILIFY) 15 MG tablet   Oral   Take 15 mg by mouth daily.           . cetirizine (ZYRTEC) 10 MG tablet   Oral   Take 10 mg by mouth daily.         Marland Kitchen FLUoxetine (PROZAC) 20 MG tablet   Oral  Take 20 mg by mouth 2 (two) times daily.           . fluticasone (FLONASE) 50 MCG/ACT nasal spray   Nasal   Place 2 sprays into the nose daily.         Marland Kitchen HYDROcodone-acetaminophen (NORCO/VICODIN) 5-325 MG per tablet   Oral   Take 1 tablet by mouth every 8 (eight) hours as needed for pain.          Marland Kitchen lamoTRIgine (LAMICTAL) 100 MG tablet   Oral   Take 100 mg by mouth daily.          Marland Kitchen lisinopril-hydrochlorothiazide (PRINZIDE,ZESTORETIC) 10-12.5 MG per tablet   Oral   Take 1 tablet by mouth daily.         . pantoprazole (PROTONIX) 40 MG tablet   Oral   Take 40 mg by mouth 2 (two) times daily.         . pravastatin (PRAVACHOL) 40 MG tablet   Oral   Take 40 mg by mouth daily.         . promethazine (PHENERGAN) 12.5 MG  suppository   Rectal   Place 1 suppository (12.5 mg total) rectally every 4 (four) hours as needed for nausea.   20 each   0     BP 95/60  Pulse 123  Temp(Src) 97.9 F (36.6 C) (Oral)  Resp 16  SpO2 99%  Physical Exam  Nursing note and vitals reviewed. Constitutional: She appears well-developed and well-nourished. No distress.  HENT:  Head: Normocephalic and atraumatic.  Eyes: Conjunctivae and EOM are normal. No scleral icterus.  Neck: Normal range of motion.  Cardiovascular: Normal rate and regular rhythm.  Exam reveals no gallop and no friction rub.   No murmur heard. Pulmonary/Chest: Effort normal and breath sounds normal. She has no wheezes. She has no rales. She exhibits no tenderness.  Abdominal: Soft. She exhibits no distension. There is tenderness. There is no rebound and no guarding.  Epigastric and RUQ tenderness to light palpation. No other focal areas of tenderness. No peritoneal signs.   Musculoskeletal: Normal range of motion.  Neurological: She is alert. Coordination normal.  Speech is goal-oriented. Moves limbs without ataxia.   Skin: Skin is warm and dry.  Psychiatric: She has a normal mood and affect. Her behavior is normal.    ED Course  Procedures (including critical care time)   Date: 08/12/2012  Rate: 91  Rhythm: normal sinus rhythm  QRS Axis: normal  Intervals: normal  ST/T Wave abnormalities: normal  Conduction Disutrbances:none  Narrative Interpretation: NSR without changes from previous  Old EKG Reviewed: unchanged    Labs Reviewed  CBC WITH DIFFERENTIAL - Abnormal; Notable for the following:    WBC 12.5 (*)    Hemoglobin 15.7 (*)    MCV 105.7 (*)    MCH 36.1 (*)    Neutrophils Relative % 90 (*)    Lymphocytes Relative 6 (*)    Neutro Abs 11.2 (*)    All other components within normal limits  URINALYSIS, ROUTINE W REFLEX MICROSCOPIC - Abnormal; Notable for the following:    Color, Urine AMBER (*)    APPearance CLOUDY (*)    Hgb  urine dipstick SMALL (*)    Bilirubin Urine SMALL (*)    Protein, ur 100 (*)    All other components within normal limits  COMPREHENSIVE METABOLIC PANEL - Abnormal; Notable for the following:    Sodium 132 (*)    Chloride 92 (*)  Glucose, Bld 213 (*)    Creatinine, Ser 1.71 (*)    Total Protein 8.4 (*)    Albumin 3.4 (*)    AST 141 (*)    ALT 92 (*)    Alkaline Phosphatase 149 (*)    GFR calc non Af Amer 34 (*)    GFR calc Af Amer 39 (*)    All other components within normal limits  LIPASE, BLOOD - Abnormal; Notable for the following:    Lipase 1858 (*)    All other components within normal limits  URINE MICROSCOPIC-ADD ON - Abnormal; Notable for the following:    Bacteria, UA FEW (*)    Casts HYALINE CASTS (*)    All other components within normal limits  URINE CULTURE  COMPREHENSIVE METABOLIC PANEL  LIPASE, BLOOD  POCT I-STAT TROPONIN I   US Abdomen Complete  08/12/2012   *RADIOLOGY REPORT*  Clinical Data:  Right upper quadrant pain  COMPLETE ABDOMINAL ULTRASOUND  Comparison:  CT 05/13/2011, CT 09/03/2009  Findings:  Gallbladder:  No gallstones, gallbladder wall thickening, or pericholecystic fluid.  Common bile duct:  Upper limits of normal in diameter 6 mm.  Liver:  Liver is densely echogenic which limits evaluation.  No evidence of dilatation.  IVC:  Appears normal.  Pancreas:  Poorly evaluated and heterogeneous.  There appears to be a fluid collection between the stomach and pancreas.  Spleen:  Normal in size and echogenicity.  Right Kidney:  11.10cm in length.  No evidence of hydronephrosis or stones.  Left Kidney:  12.0cm in length.  No evidence of hydronephrosis or stones.  Abdominal aorta:  No aneurysm identified.  Left upper quadrant, there is an 8 cm x 4 cm x 80 cm complex cystic structure which may represent a pseudocyst.  IMPRESSION:  1.  Densely echogenic liver either represents hepatic steatosis or hepatocellular disease. 2.  Potential 8 cm fluid collection in the left  upper quadrant. This may represent a pancreatic pseudocyst.  Consider CT abdomen and pelvis with contrast for further evaluation. 3.  Small potential fluid between the pancreas and the stomach. 4.  Pancreas is poorly evaluated.   Original Report Authenticated By: Genevive Bi, M.D.   Ct Abdomen Pelvis W Contrast  08/12/2012   *RADIOLOGY REPORT*  Clinical Data: Left upper quadrant pain and emesis  CT ABDOMEN AND PELVIS WITH CONTRAST  Technique:  Multidetector CT imaging of the abdomen and pelvis was performed following the standard protocol during bolus administration of intravenous contrast.  Contrast: 50mL OMNIPAQUE IOHEXOL 300 MG/ML  SOLN, OMNIPAQUE IOHEXOL 300 MG/ML  SOLN  Comparison: 09/03/2009  Findings:  No pleural or pericardial effusion.  The lung bases are clear.  There is diffuse fatty infiltration throughout the liver parenchyma.  Gallbladder appears within normal limits.  No biliary dilatation.  There is diffuse edema and inflammation involving the pancreas compatible with acute pancreatitis.  Fluid is extending into the lesser sac and right perinephric space.  No focal fluid collections are identified.  Normal appearance of the spleen.  The splenic artery and vein remains patent.  The portal vein is patent.  Normal appearance of the left adrenal gland.  Calcifications involving the right adrenal gland noted.  The right kidney is normal.  The left kidney is normal.  Urinary bladder is unremarkable.  The stomach is normal.  The small bowel loops have a normal course and caliber without evidence for obstruction.  Normal appearance of the colon.  Review of the visualized bony structures  is unremarkable.  IMPRESSION:  1.  Exam is positive for acute pancreatitis.  There is marked peripancreatic inflammation and fluid.  Fluid is noted extending into the lesser sac and right perinephric space. No discrete fluid collection identified to suggest pseudocyst. 2.  Fatty infiltration of the liver.    Original Report Authenticated By: Signa Kell, M.D.     1. Pseudocyst of pancreas       MDM  10:51 AM Labs, EKG, and urinalysis pending. Patient will have fluids, morphine and zofran. Patient tachycardic at this time.   12:50 PM Patient's LFT's elevated and lipase is 1858. Patient will have RUQ [place] to rule out stone.       Emilia Beck, PA-C 08/12/12 2002

## 2012-08-12 NOTE — H&P (Addendum)
[facility] History and Physical  CINDE EBERT ZOX:[id number] DOB: 02-07-1961 DOA: 08/12/2012  Referring physician:   PCP: [name], MD   Chief Complaint: *Abdominal pain  HPI:  52 year old female history of ulcerated colitis, pancreatitis in 1980., Episode of "gastritis"in April. She describes pain in her left upper quadrant, bandlike pain beneath her ribs. She has not been able to tolerate any liquids or solid food for the last couple of days. Patient also states that she's developed borderline diabetes since April and her primary care doctor is following it closely. She states that she is ulcerated colitis is seen by Dr. [name] and has had 4-5 occasionally bloody stools, this is normal for her. She states that she's been eating healthy and has lost 45 pounds in the last one year.     Review of Systems: negative for the following  Constitutional: Denies fever, chills, diaphoresis, appetite change and fatigue.  HEENT: Denies photophobia, eye pain, redness, hearing loss, ear pain, congestion, sore throat, rhinorrhea, sneezing, mouth sores, trouble swallowing, neck pain, neck stiffness and tinnitus.  Respiratory: Denies SOB, DOE, cough, chest tightness, and wheezing.  Cardiovascular: Denies chest pain, palpitations and leg swelling.  Gastrointestinal: Denies nausea, vomiting, abdominal pain, diarrhea, constipation, blood in stool and abdominal distention.  Genitourinary: Denies dysuria, urgency, frequency, hematuria, flank pain and difficulty urinating.  Musculoskeletal: Denies myalgias, back pain, joint swelling, arthralgias and gait problem.  Skin: Denies pallor, rash and wound.  Neurological: Denies dizziness, seizures, syncope, weakness, light-headedness, numbness and headaches.  Hematological: Denies adenopathy. Easy bruising, personal or family bleeding history  Psychiatric/Behavioral: Denies suicidal ideation, mood changes, confusion, nervousness, sleep  disturbance and agitation       Past Medical History  Diagnosis Date  . Personal history of colonic polyps 10/16/2009    hyperplastic  . Diverticulosis of colon (without mention of hemorrhage)   . Esophageal reflux   . Ulcerative colitis, unspecified   . Depressive disorder, not elsewhere classified   . Bipolar disorder   . Hiatal hernia   . Pre-diabetes   . Hypertension   . Anemia   . Hyperlipidemia   . Chronic kidney disease   . Anal fistula   . Obesity   . Iron deficiency      Past Surgical History  Procedure Laterality Date  . Finger amputation  2010    right index  . Knee arthroscopy      bilateral, left x2  . Hemorroidectomy    . Total abdominal hysterectomy        Social History:  reports that she has been smoking Cigarettes.  She has been smoking about 0.00 packs per day. She has never used smokeless tobacco. She reports that she does not drink alcohol or use illicit drugs.    No Known Allergies  Family History  Problem Relation Age of Onset  . Irritable bowel syndrome Mother   . Ovarian cancer Maternal Aunt   . Diabetes Father   . Heart attack Father   . Hypertension Father   . Hypertension Mother   . Thyroid disease Sister   . Thyroid disease Brother      Prior to Admission medications   Medication Sig Start Date End Date Taking? Authorizing Provider  ARIPiprazole (ABILIFY) 15 MG tablet Take 15 mg by mouth daily.     Yes Historical Provider, MD  cetirizine (ZYRTEC) 10 MG tablet Take 10 mg by mouth daily.   Yes Historical Provider, MD  FLUoxetine (PROZAC) 20 MG tablet Take 20  mg by mouth 2 (two) times daily.     Yes Historical Provider, MD  fluticasone (FLONASE) 50 MCG/ACT nasal spray Place 2 sprays into the nose daily.   Yes Historical Provider, MD  HYDROcodone-acetaminophen (NORCO/VICODIN) 5-325 MG per tablet Take 1 tablet by mouth every 8 (eight) hours as needed for pain.    Yes Historical Provider, MD  lamoTRIgine (LAMICTAL) 100 MG tablet  Take 100 mg by mouth daily.    Yes Historical Provider, MD  lisinopril-hydrochlorothiazide (PRINZIDE,ZESTORETIC) 10-12.5 MG per tablet Take 1 tablet by mouth daily.   Yes Historical Provider, MD  pantoprazole (PROTONIX) 40 MG tablet Take 40 mg by mouth 2 (two) times daily.   Yes Historical Provider, MD  pravastatin (PRAVACHOL) 40 MG tablet Take 40 mg by mouth daily.   Yes Historical Provider, MD  promethazine (PHENERGAN) 12.5 MG suppository Place 1 suppository (12.5 mg total) rectally every 4 (four) hours as needed for nausea. 06/08/12  Yes Meredith Pel, NP     Physical Exam: Filed Vitals:   08/12/12 1021 08/12/12 1136 08/12/12 1459  BP: 95/60 127/79 159/88  Pulse: 123 93 93  Temp: 97.9 F (36.6 C) 98.3 F (36.8 C) 99.1 F (37.3 C)  TempSrc: Oral Oral Oral  Resp: 16  20  SpO2: 99% 100% 95%     Constitutional: Vital signs reviewed. Patient is a well-developed and well-nourished in no acute distress and cooperative with exam. Alert and oriented x3.  Head: Normocephalic and atraumatic  Ear: TM normal bilaterally  Mouth: no erythema or exudates, MMM  Eyes: PERRL, EOMI, conjunctivae normal, No scleral icterus.  Neck: Supple, Trachea midline normal ROM, No JVD, mass, thyromegaly, or carotid bruit present.  Cardiovascular: RRR, S1 normal, S2 normal, no MRG, pulses symmetric and intact bilaterally  Pulmonary/Chest: CTAB, no wheezes, rales, or rhonchi  Abdominal: Soft. Non-tender, non-distended, bowel sounds are normal, no masses, organomegaly, or guarding present.  GU: no CVA tenderness Musculoskeletal: No joint deformities, erythema, or stiffness, ROM full and no nontender Ext: no edema and no cyanosis, pulses palpable bilaterally (DP and PT)  Hematology: no cervical, inginal, or axillary adenopathy.  Neurological: A&O x3, Strenght is normal and symmetric bilaterally, cranial nerve II-XII are grossly intact, no focal motor deficit, sensory intact to light touch bilaterally.  Skin:  Warm, dry and intact. No rash, cyanosis, or clubbing.  Psychiatric: Normal mood and affect. speech and behavior is normal. Judgment and thought content normal. Cognition and memory are normal.       Labs on Admission:    Basic Metabolic Panel:  Recent Labs Lab 08/12/12 1045  NA 132*  K 4.4  CL 92*  CO2 24  GLUCOSE 213*  BUN 19  CREATININE 1.71*  CALCIUM 10.0   Liver Function Tests:  Recent Labs Lab 08/12/12 1045  AST 141*  ALT 92*  ALKPHOS 149*  BILITOT 1.2  PROT 8.4*  ALBUMIN 3.4*    Recent Labs Lab 08/12/12 1045  LIPASE 1858*   No results found for this basename: AMMONIA,  in the last 168 hours CBC:  Recent Labs Lab 08/12/12 1043  WBC 12.5*  NEUTROABS 11.2*  HGB 15.7*  HCT 46.0  MCV 105.7*  PLT 236   Cardiac Enzymes: No results found for this basename: CKTOTAL, CKMB, CKMBINDEX, TROPONINI,  in the last 168 hours  BNP (last 3 results) No results found for this basename: PROBNP,  in the last 8760 hours    CBG: No results found for this basename: GLUCAP,  in the  last 168 hours  Radiological Exams on Admission: US Abdomen Complete  08/12/2012   *RADIOLOGY REPORT*  Clinical Data:  Right upper quadrant pain  COMPLETE ABDOMINAL ULTRASOUND  Comparison:  CT 05/13/2011, CT 09/03/2009  Findings:  Gallbladder:  No gallstones, gallbladder wall thickening, or pericholecystic fluid.  Common bile duct:  Upper limits of normal in diameter 6 mm.  Liver:  Liver is densely echogenic which limits evaluation.  No evidence of dilatation.  IVC:  Appears normal.  Pancreas:  Poorly evaluated and heterogeneous.  There appears to be a fluid collection between the stomach and pancreas.  Spleen:  Normal in size and echogenicity.  Right Kidney:  11.10cm in length.  No evidence of hydronephrosis or stones.  Left Kidney:  12.0cm in length.  No evidence of hydronephrosis or stones.  Abdominal aorta:  No aneurysm identified.  Left upper quadrant, there is an 8 cm x 4 cm x 80 cm  complex cystic structure which may represent a pseudocyst.  IMPRESSION:  1.  Densely echogenic liver either represents hepatic steatosis or hepatocellular disease. 2.  Potential 8 cm fluid collection in the left upper quadrant. This may represent a pancreatic pseudocyst.  Consider CT abdomen and pelvis with contrast for further evaluation. 3.  Small potential fluid between the pancreas and the stomach. 4.  Pancreas is poorly evaluated.   Original Report Authenticated By: Genevive Bi, M.D.    EKG: Independently reviewed. *   Assessment/Plan Principal Problem:   Pseudocyst of pancreas Active Problems:   ULCERATIVE COLITIS   GERD (gastroesophageal reflux disease)   Acute pancreatitis   Acute Pancreatitis with pseudocyst CT scan pending Keep the patient n.p.o hydrate aggressively with IV fluids Surgical consultation requested Monitor liver function  Ulcerated colitis She is followed by Dr. [name] She had an endoscopy that showed hemorrhoids last year We'll monitor for rectal bleeding Surgical consultation in a.m.  Hyponatremia likely secondary to dehydration Hydrate with normal saline  Gastroesophageal reflux disease PPI   Code Status:   full Family Communication: bedside Disposition Plan: admit   Time spent: 70 mins   [facility] Pager [phone]  If 7PM-7AM, please contact night-coverage [email or web address] Password [facility] 08/12/2012, 4:16 PM

## 2012-08-12 NOTE — ED Notes (Signed)
Pt reports onset of bilat flank pain 2-3 days ago, radiated to abd and back - pain has gotten progressively worse. Reports onset of n/v/d - was seen by GI, treated for ulcerative colitis and hiatal hernia. States pulse has been elevated and bp low

## 2012-08-13 DIAGNOSIS — K649 Unspecified hemorrhoids: Secondary | ICD-10-CM

## 2012-08-13 DIAGNOSIS — K5190 Ulcerative colitis, unspecified, without complications: Secondary | ICD-10-CM

## 2012-08-13 LAB — URINE CULTURE
Colony Count: 65000
Special Requests: NORMAL

## 2012-08-13 LAB — LIPASE, BLOOD: Lipase: 460 U/L — ABNORMAL HIGH (ref 11–59)

## 2012-08-13 LAB — COMPREHENSIVE METABOLIC PANEL
Albumin: 2.5 g/dL — ABNORMAL LOW (ref 3.5–5.2)
BUN: 18 mg/dL (ref 6–23)
Creatinine, Ser: 0.78 mg/dL (ref 0.50–1.10)
Potassium: 3.2 mEq/L — ABNORMAL LOW (ref 3.5–5.1)
Total Protein: 6.6 g/dL (ref 6.0–8.3)

## 2012-08-13 LAB — LIPID PANEL
Cholesterol: 196 mg/dL (ref 0–200)
HDL: 35 mg/dL — ABNORMAL LOW (ref >39)
Total CHOL/HDL Ratio: 5.6 RATIO

## 2012-08-13 MED ORDER — ONDANSETRON HCL 4 MG/2ML IJ SOLN: 4.0000 mg | Freq: Four times a day (QID) | INTRAMUSCULAR | Status: DC | PRN

## 2012-08-13 MED ORDER — HYDROMORPHONE HCL PF 2 MG/ML IJ SOLN
2.0000 mg | INTRAMUSCULAR | Status: DC | PRN
  Administered 2012-08-13 – 2012-08-15 (×17): 2 mg via INTRAVENOUS
  Filled 2012-08-13 (×17): qty 1

## 2012-08-13 MED ORDER — PANTOPRAZOLE SODIUM 40 MG IV SOLR
40.0000 mg | INTRAVENOUS | Status: DC
  Administered 2012-08-13 – 2012-08-15 (×3): 40 mg via INTRAVENOUS
  Filled 2012-08-13 (×4): qty 40

## 2012-08-13 MED ORDER — POTASSIUM CHLORIDE 10 MEQ/100ML IV SOLN
10.0000 meq | INTRAVENOUS | Status: AC
  Administered 2012-08-13 (×4): 10 meq via INTRAVENOUS
  Filled 2012-08-13 (×4): qty 100

## 2012-08-13 MED ORDER — HYDROMORPHONE HCL PF 1 MG/ML IJ SOLN
1.0000 mg | INTRAMUSCULAR | Status: DC | PRN
  Administered 2012-08-13: 1 mg via INTRAVENOUS
  Filled 2012-08-13: qty 1

## 2012-08-13 NOTE — ED Provider Notes (Signed)
Medical screening examination/treatment/procedure(s) were performed by non-physician practitioner and as supervising physician I was immediately available for consultation/collaboration.  Juliet Rude. Rubin Payor, MD 08/13/12 1549

## 2012-08-13 NOTE — Progress Notes (Signed)
[facility] PROGRESS NOTE  [name] [name] HQI:[id number] DOB: Sep 06, 1960 DOA: 08/12/2012 PCP: [name], MD  Assessment/Plan: Principal Problem:   Pseudocyst of pancreas Active Problems:   ULCERATIVE COLITIS   GERD (gastroesophageal reflux disease)   Acute pancreatitis    1. Acute Pancreatitis: Patient presented with 4 days of vomiting, and 2 days of upper abdominal pain, radiating round to the back. Lipase was elevated at 1858, and abdominal/pelvic CT scan revealed acute pancreatitis, with marked peripancreatic inflammation and fluid, but no discrete fluid collection identified to suggest pseudocyst. Patient had a leukocytosis of 12.5, but was afebrile, and has no obvious focus of infection, so this is likely reactive.  Managing with bowel rest, PPI, analgesics, antiemetics and aggressive iv fluids. Following lipase levels, which have already improved at 460 today. Patient has no evidence of cholelithiasis, and does not abuse ETOH. Will check Lipid profile to evaluate for hypertriglyceridemia.  2. Ulcerative colitis: Patient has a known history of ulcerative colitis, under the care of Dr. [name], [facility]. She usually has 5-7 stools/day at baseline, sometimes with intermittent blood in stools. Endoscopic evaluation in 2013, revealed hemorrhoids. GI will be invited to participate in her care.  3. Hyponatremia: This was mild at 132 at presentation, and attributable to volume depletion. Managing with NS.  4. Hypokalemia: Repleting as indicated.  5. GERD: On PPI.  6. HTN: BP was borderline at 95/60 at presentation, but improved to 127/79, with rehydration. Pre-admission antihypertensives are on hold, and as BP is creeping up, will utilize parenteral antihypertensives.  7. Query DM: Patient has previously ben diagnosed as "pre-diabetic". Suspect this is part of a metabolic syndrome. Random blood glucose was normal at 129. Will check HBA1C.   Code Status: Full Code.   Family Communication:  Disposition Plan: To be determined.    Brief narrative: 52 year old female with history of HTN, DM, CKD, pre-diabetes, chronic anemia, Hiatal hernia, GERD, obesity, Bipolar disorder, ulcerative colitis, pancreatitis in 1980 and an episode of "gastritis"in April 2014. She describes band-like pain in her left upper quadrant, beneath her ribs and has not been able to tolerate any liquids or solid food for the last couple of days. Patient also states that she's developed borderline diabetes since April and her primary care doctor is following it closely. She states that she has ulcerative colitis, is followed by Dr. [name] and has had 4-5 occasionally bloody stools, which is normal for her. She states that she's been eating healthy and has lost 45 pounds in the last one year. Admitted for further management.    Consultants:  Surgeon.   Procedures:  Abdominal U/S.   Abdominal/pelvic CT scan.   Antibiotics:  N/A.   HPI/Subjective: Feels better, but still has abdominal pain.   Objective: Vital signs in last 24 hours: Temp:  [97.9 F (36.6 C)-99.1 F (37.3 C)] 99 F (37.2 C) (06/22 0600) Pulse Rate:  [88-123] 88 (06/22 0600) Resp:  [16-20] 20 (06/22 0600) BP: (95-178)/(60-105) 155/91 mmHg (06/22 0600) SpO2:  [95 %-100 %] 96 % (06/22 0600) Weight:  [88.905 kg (196 lb)] 88.905 kg (196 lb) (06/21 1714) Weight change:     Intake/Output from previous day: 06/21 0701 - 06/22 0700 In: 2100 [I.V.:2100] Out: -      Physical Exam: General: Comfortable, alert, communicative, fully oriented, not short of breath at rest.  HEENT:  No clinical pallor, no jaundice, no conjunctival injection or discharge. Buccal mucosa appears "dry"  NECK:  Supple, JVP not seen, no carotid  bruits, no palpable lymphadenopathy, no palpable goiter. CHEST:  Clinically clear to auscultation, no wheezes, no crackles. HEART:  Sounds 1 and 2 heard, normal, regular, no  murmurs. ABDOMEN:  Moderately obese, soft, mildly-moderately tender in epigastrium, no palpable organomegaly, no palpable masses, normal bowel sounds. GENITALIA:  Not examined. LOWER EXTREMITIES:  No pitting edema, palpable peripheral pulses. MUSCULOSKELETAL SYSTEM:  unremarkable. CENTRAL NERVOUS SYSTEM:  No focal neurologic deficit on gross examination.  Lab Results:  Recent Labs  08/12/12 1043  WBC 12.5*  HGB 15.7*  HCT 46.0  PLT 236    Recent Labs  08/12/12 1045 08/13/12 0545  NA 132* 133*  K 4.4 3.2*  CL 92* 101  CO2 24 22  GLUCOSE 213* 129*  BUN 19 18  CREATININE 1.71* 0.78  CALCIUM 10.0 8.2*   No results found for this or any previous visit (from the past 240 hour(s)).   Studies/Results: US Abdomen Complete  08/12/2012   *RADIOLOGY REPORT*  Clinical Data:  Right upper quadrant pain  COMPLETE ABDOMINAL ULTRASOUND  Comparison:  CT 05/13/2011, CT 09/03/2009  Findings:  Gallbladder:  No gallstones, gallbladder wall thickening, or pericholecystic fluid.  Common bile duct:  Upper limits of normal in diameter 6 mm.  Liver:  Liver is densely echogenic which limits evaluation.  No evidence of dilatation.  IVC:  Appears normal.  Pancreas:  Poorly evaluated and heterogeneous.  There appears to be a fluid collection between the stomach and pancreas.  Spleen:  Normal in size and echogenicity.  Right Kidney:  11.10cm in length.  No evidence of hydronephrosis or stones.  Left Kidney:  12.0cm in length.  No evidence of hydronephrosis or stones.  Abdominal aorta:  No aneurysm identified.  Left upper quadrant, there is an 8 cm x 4 cm x 80 cm complex cystic structure which may represent a pseudocyst.  IMPRESSION:  1.  Densely echogenic liver either represents hepatic steatosis or hepatocellular disease. 2.  Potential 8 cm fluid collection in the left upper quadrant. This may represent a pancreatic pseudocyst.  Consider CT abdomen and pelvis with contrast for further evaluation. 3.  Small  potential fluid between the pancreas and the stomach. 4.  Pancreas is poorly evaluated.   Original Report Authenticated By: Genevive Bi, M.D.   Ct Abdomen Pelvis W Contrast  08/12/2012   *RADIOLOGY REPORT*  Clinical Data: Left upper quadrant pain and emesis  CT ABDOMEN AND PELVIS WITH CONTRAST  Technique:  Multidetector CT imaging of the abdomen and pelvis was performed following the standard protocol during bolus administration of intravenous contrast.  Contrast: 50mL OMNIPAQUE IOHEXOL 300 MG/ML  SOLN, OMNIPAQUE IOHEXOL 300 MG/ML  SOLN  Comparison: 09/03/2009  Findings:  No pleural or pericardial effusion.  The lung bases are clear.  There is diffuse fatty infiltration throughout the liver parenchyma.  Gallbladder appears within normal limits.  No biliary dilatation.  There is diffuse edema and inflammation involving the pancreas compatible with acute pancreatitis.  Fluid is extending into the lesser sac and right perinephric space.  No focal fluid collections are identified.  Normal appearance of the spleen.  The splenic artery and vein remains patent.  The portal vein is patent.  Normal appearance of the left adrenal gland.  Calcifications involving the right adrenal gland noted.  The right kidney is normal.  The left kidney is normal.  Urinary bladder is unremarkable.  The stomach is normal.  The small bowel loops have a normal course and caliber without evidence for obstruction.  Normal appearance of the colon.  Review of the visualized bony structures is unremarkable.  IMPRESSION:  1.  Exam is positive for acute pancreatitis.  There is marked peripancreatic inflammation and fluid.  Fluid is noted extending into the lesser sac and right perinephric space. No discrete fluid collection identified to suggest pseudocyst. 2.  Fatty infiltration of the liver.   Original Report Authenticated By: Signa Kell, M.D.    Medications: Scheduled Meds: . ARIPiprazole  15 mg Oral Daily  . enoxaparin  (LOVENOX) injection  40 mg Subcutaneous Q24H  . FLUoxetine  20 mg Oral BID  . fluticasone  2 spray Each Nare Daily  . lamoTRIgine  100 mg Oral Daily  . sodium chloride  3 mL Intravenous Q12H   Continuous Infusions: . sodium chloride 175 mL/hr at 08/13/12 0500   PRN Meds:.HYDROmorphone (DILAUDID) injection    LOS: 1 day   Chantella Creech,CHRISTOPHER  [facility] Pager [phone]. If 8PM-8AM, please contact night-coverage at [email or web address], password [facility] 08/13/2012, 7:35 AM  LOS: 1 day

## 2012-08-13 NOTE — Consult Note (Signed)
Cross cover [facility] Reason for Consult:Pancreatitis/rectal bleeding. Referring Physician: [facility]-Dr. Ricke Hey. [name] [name] is an 52 y.o. female.   HPI: 52 year old, obese white presents to the hospital with a 3-4 history of epigastric and periumbilical pain radiating to her back. She has had nausea with the pain but has not vomited. She was also diagnosed with a solitary rectal ulcer during a colonoscopy within the last year and was advised to try Canasa suppositories for that. As per my review of her chart, she does not have IBD. For about 3 days she has had some blood in the stool but she feels this is from her hemorrhoids. She was on Canasa suppositories till 6 months ago and she stopped using them on her own as the rectal bleeding she was having had resolved. She denies having any ocular complaints, skin rashes or joint pains. She has had arthritis in the knees and has had arthroscopies in the past.   Past Medical History  Diagnosis Date  . Personal history of colonic polyps 10/16/2009    hyperplastic  . Diverticulosis of colon (without mention of hemorrhage)   . Esophageal reflux   . SRUS-Solitary rectal ulcer syndrome   . Depression/OCD/Bipolar disorder   . Bipolar disorder   . Hiatal hernia   . Pre-diabetes   . Hypertension   . Anemia   . Hyperlipidemia   . Chronic kidney disease   . Anal fistula   . Morbid obesity   . Iron deficiency        Alcohol abuse      Alcoholic pancreatitis in the 80's.      Spastic colon diagnosed in her 20's  Past Surgical History  Procedure Laterality Date  . Finger amputation  2010    right index  . Knee arthroscopy      bilateral, left x2  . Hemorroidectomy    . Total abdominal hysterectomy     Family History  Problem Relation Age of Onset  . Irritable bowel syndrome Mother   . Ovarian cancer Maternal Aunt   . Diabetes Father   . Heart attack Father   . Hypertension Father   . Hypertension Mother   . Thyroid disease Sister   .  Thyroid disease Brother     Social History:  reports that she has been smoking Cigarettes.  She has been smoking about 0.00 packs per day. She has never used smokeless tobacco. She reports that she does not drink alcohol or use illicit drugs. She claims she drank heavily in the 80's but did not drink for 8 years after that. She has been drinking about 1/2 a pint of gin 2-3 times a week for the last 1 year.  Allergies: No Known Allergies  Medications: I have reviewed the patient's current medications.  Results for orders placed during the hospital encounter of 08/12/12 (from the past 48 hour(s))  CBC WITH DIFFERENTIAL     Status: Abnormal   Collection Time    08/12/12 10:43 AM      Result Value Range   WBC 12.5 (*) 4.0 - 10.5 K/uL   RBC 4.35  3.87 - 5.11 MIL/uL   Hemoglobin 15.7 (*) 12.0 - 15.0 g/dL   HCT 16.1  09.6 - 04.5 %   MCV 105.7 (*) 78.0 - 100.0 fL   MCH 36.1 (*) 26.0 - 34.0 pg   MCHC 34.1  30.0 - 36.0 g/dL   RDW 40.9  81.1 - 91.4 %   Platelets 236  150 - 400 K/uL   Neutrophils Relative % 90 (*) 43 - 77 %   Lymphocytes Relative 6 (*) 12 - 46 %   Monocytes Relative 4  3 - 12 %   Eosinophils Relative 0  0 - 5 %   Basophils Relative 0  0 - 1 %   Neutro Abs 11.2 (*) 1.7 - 7.7 K/uL   Lymphs Abs 0.8  0.7 - 4.0 K/uL   Monocytes Absolute 0.5  0.1 - 1.0 K/uL   Eosinophils Absolute 0.0  0.0 - 0.7 K/uL   Basophils Absolute 0.0  0.0 - 0.1 K/uL   Smear Review MORPHOLOGY UNREMARKABLE    COMPREHENSIVE METABOLIC PANEL     Status: Abnormal   Collection Time    08/12/12 10:45 AM      Result Value Range   Sodium 132 (*) 135 - 145 mEq/L   Potassium 4.4  3.5 - 5.1 mEq/L   Chloride 92 (*) 96 - 112 mEq/L   CO2 24  19 - 32 mEq/L   Glucose, Bld 213 (*) 70 - 99 mg/dL   BUN 19  6 - 23 mg/dL   Creatinine, Ser 4.09 (*) 0.50 - 1.10 mg/dL   Calcium 81.1  8.4 - 91.4 mg/dL   Total Protein 8.4 (*) 6.0 - 8.3 g/dL   Albumin 3.4 (*) 3.5 - 5.2 g/dL   AST 782 (*) 0 - 37 U/L   ALT 92 (*) 0 - 35 U/L    Alkaline Phosphatase 149 (*) 39 - 117 U/L   Total Bilirubin 1.2  0.3 - 1.2 mg/dL   GFR calc non Af Amer 34 (*) >90 mL/min   GFR calc Af Amer 39 (*) >90 mL/min   Comment:            The eGFR has been calculated     using the CKD EPI equation.     This calculation has not been     validated in all clinical     situations.     eGFR's persistently     <90 mL/min signify     possible Chronic Kidney Disease.  LIPASE, BLOOD     Status: Abnormal   Collection Time    08/12/12 10:45 AM      Result Value Range   Lipase 1858 (*) 11 - 59 U/L  POCT I-STAT TROPONIN I     Status: None   Collection Time    08/12/12 10:57 AM      Result Value Range   Troponin i, poc 0.00  0.00 - 0.08 ng/mL   Comment 3            Comment: Due to the release kinetics of cTnI,     a negative result within the first hours     of the onset of symptoms does not rule out     myocardial infarction with certainty.     If myocardial infarction is still suspected,     repeat the test at appropriate intervals.  URINALYSIS, ROUTINE W REFLEX MICROSCOPIC     Status: Abnormal   Collection Time    08/12/12  2:07 PM      Result Value Range   Color, Urine AMBER (*) YELLOW   Comment: BIOCHEMICALS MAY BE AFFECTED BY COLOR   APPearance CLOUDY (*) CLEAR   Specific Gravity, Urine 1.024  1.005 - 1.030   pH 5.0  5.0 - 8.0   Glucose, UA NEGATIVE  NEGATIVE mg/dL   Hgb urine dipstick  SMALL (*) NEGATIVE   Bilirubin Urine SMALL (*) NEGATIVE   Ketones, ur NEGATIVE  NEGATIVE mg/dL   Protein, ur 147 (*) NEGATIVE mg/dL   Urobilinogen, UA 0.2  0.0 - 1.0 mg/dL   Nitrite NEGATIVE  NEGATIVE   Leukocytes, UA NEGATIVE  NEGATIVE  URINE MICROSCOPIC-ADD ON     Status: Abnormal   Collection Time    08/12/12  2:07 PM      Result Value Range   Squamous Epithelial / LPF RARE  RARE   WBC, UA 0-2  <3 WBC/hpf   RBC / HPF 0-2  <3 RBC/hpf   Bacteria, UA FEW (*) RARE   Casts HYALINE CASTS (*) NEGATIVE   Comment: GRANULAR CAST   Urine-Other  MUCOUS PRESENT    COMPREHENSIVE METABOLIC PANEL     Status: Abnormal   Collection Time    08/13/12  5:45 AM      Result Value Range   Sodium 133 (*) 135 - 145 mEq/L   Potassium 3.2 (*) 3.5 - 5.1 mEq/L   Comment: DELTA CHECK NOTED   Chloride 101  96 - 112 mEq/L   Comment: DELTA CHECK NOTED   CO2 22  19 - 32 mEq/L   Glucose, Bld 129 (*) 70 - 99 mg/dL   BUN 18  6 - 23 mg/dL   Creatinine, Ser 8.29  0.50 - 1.10 mg/dL   Comment: DELTA CHECK NOTED   Calcium 8.2 (*) 8.4 - 10.5 mg/dL   Total Protein 6.6  6.0 - 8.3 g/dL   Albumin 2.5 (*) 3.5 - 5.2 g/dL   AST 71 (*) 0 - 37 U/L   ALT 54 (*) 0 - 35 U/L   Alkaline Phosphatase 109  39 - 117 U/L   Total Bilirubin 1.2  0.3 - 1.2 mg/dL   GFR calc non Af Amer >90  >90 mL/min   GFR calc Af Amer >90  >90 mL/min   Comment:            The eGFR has been calculated     using the CKD EPI equation.     This calculation has not been     validated in all clinical     situations.     eGFR's persistently     <90 mL/min signify     possible Chronic Kidney Disease.  LIPASE, BLOOD     Status: Abnormal   Collection Time    08/13/12  5:45 AM      Result Value Range   Lipase 460 (*) 11 - 59 U/L  LIPID PANEL     Status: Abnormal   Collection Time    08/13/12  5:45 AM      Result Value Range   Cholesterol 196  0 - 200 mg/dL   Triglycerides 562  <130 mg/dL   HDL 35 (*) >86 mg/dL   Total CHOL/HDL Ratio 5.6     VLDL 22  0 - 40 mg/dL   LDL Cholesterol 578 (*) 0 - 99 mg/dL   Comment:            Total Cholesterol/HDL:CHD Risk     Coronary Heart Disease Risk Table                         Men   Women      1/2 Average Risk   3.4   3.3      Average Risk       5.0  4.4      2 X Average Risk   9.6   7.1      3 X Average Risk  23.4   11.0                Use the calculated Patient Ratio     above and the CHD Risk Table     to determine the patient's CHD Risk.                ATP III CLASSIFICATION (LDL):      <100     mg/dL   Optimal      952-841  mg/dL    Near or Above                        Optimal      130-159  mg/dL   Borderline      324-401  mg/dL   High      >027     mg/dL   Very High  MAGNESIUM     Status: None   Collection Time    08/13/12  5:45 AM      Result Value Range   Magnesium 1.5  1.5 - 2.5 mg/dL    US Abdomen Complete  08/12/2012   *RADIOLOGY REPORT*  Clinical Data:  Right upper quadrant pain  COMPLETE ABDOMINAL ULTRASOUND  Comparison:  CT 05/13/2011, CT 09/03/2009  Findings:  Gallbladder:  No gallstones, gallbladder wall thickening, or pericholecystic fluid.  Common bile duct:  Upper limits of normal in diameter 6 mm.  Liver:  Liver is densely echogenic which limits evaluation.  No evidence of dilatation.  IVC:  Appears normal.  Pancreas:  Poorly evaluated and heterogeneous.  There appears to be a fluid collection between the stomach and pancreas.  Spleen:  Normal in size and echogenicity.  Right Kidney:  11.10cm in length.  No evidence of hydronephrosis or stones.  Left Kidney:  12.0cm in length.  No evidence of hydronephrosis or stones.  Abdominal aorta:  No aneurysm identified.  Left upper quadrant, there is an 8 cm x 4 cm x 80 cm complex cystic structure which may represent a pseudocyst.  IMPRESSION:  1.  Densely echogenic liver either represents hepatic steatosis or hepatocellular disease. 2.  Potential 8 cm fluid collection in the left upper quadrant. This may represent a pancreatic pseudocyst.  Consider CT abdomen and pelvis with contrast for further evaluation. 3.  Small potential fluid between the pancreas and the stomach. 4.  Pancreas is poorly evaluated.   Original Report Authenticated By: Genevive Bi, M.D.   Ct Abdomen Pelvis W Contrast  08/12/2012   *RADIOLOGY REPORT*  Clinical Data: Left upper quadrant pain and emesis  CT ABDOMEN AND PELVIS WITH CONTRAST  Technique:  Multidetector CT imaging of the abdomen and pelvis was performed following the standard protocol during bolus administration of intravenous contrast.   Contrast: 50mL OMNIPAQUE IOHEXOL 300 MG/ML  SOLN, OMNIPAQUE IOHEXOL 300 MG/ML  SOLN  Comparison: 09/03/2009  Findings:  No pleural or pericardial effusion.  The lung bases are clear.  There is diffuse fatty infiltration throughout the liver parenchyma.  Gallbladder appears within normal limits.  No biliary dilatation.  There is diffuse edema and inflammation involving the pancreas compatible with acute pancreatitis.  Fluid is extending into the lesser sac and right perinephric space.  No focal fluid collections are identified.  Normal appearance of the spleen.  The splenic artery and vein remains patent.  The portal vein is patent.  Normal appearance of the left adrenal gland.  Calcifications involving the right adrenal gland noted.  The right kidney is normal.  The left kidney is normal.  Urinary bladder is unremarkable.  The stomach is normal.  The small bowel loops have a normal course and caliber without evidence for obstruction.  Normal appearance of the colon.  Review of the visualized bony structures is unremarkable.  IMPRESSION:  1.  Exam is positive for acute pancreatitis.  There is marked peripancreatic inflammation and fluid.  Fluid is noted extending into the lesser sac and right perinephric space. No discrete fluid collection identified to suggest pseudocyst. 2.  Fatty infiltration of the liver.   Original Report Authenticated By: Signa Kell, M.D.   Review of Systems  Constitutional: Negative for fever, chills, weight loss and malaise/fatigue.  HENT: Negative.   Eyes: Negative.   Respiratory: Negative.   Cardiovascular: Negative.   Gastrointestinal: Positive for heartburn, nausea, abdominal pain, diarrhea and blood in stool. Negative for vomiting, constipation and melena.  Genitourinary: Negative.   Musculoskeletal: Positive for joint pain.  Skin: Negative.   Neurological: Negative.   Endo/Heme/Allergies: Negative.   Psychiatric/Behavioral: Negative for depression, suicidal ideas  and substance abuse. The patient is nervous/anxious.    Blood pressure 123/67, pulse 79, temperature 98 F (36.7 C), temperature source Oral, resp. rate 20, height 5\' 5"  (1.651 m), weight 88.905 kg (196 lb), SpO2 97.00%. Physical Exam  Constitutional: She is oriented to person, place, and time. She appears well-developed and well-nourished.  HENT:  Head: Normocephalic and atraumatic.  Eyes: Conjunctivae and EOM are normal. Pupils are equal, round, and reactive to light.  Neck: Normal range of motion. Neck supple.  Cardiovascular: Normal rate, regular rhythm and normal heart sounds.   Respiratory: Effort normal and breath sounds normal.  GI: Soft. Bowel sounds are normal. She exhibits no distension and no mass. There is no hepatosplenomegaly. There is tenderness in the epigastric area and periumbilical area. There is guarding. There is no rebound and no CVA tenderness.  Musculoskeletal: Normal range of motion.  Patient has had an amputation of the index finger on the right hand   Neurological: She is alert and oriented to person, place, and time.  Skin: Skin is dry.  Psychiatric: She has a normal mood and affect. Her behavior is normal. Judgment and thought content normal.   Assessment/Plan: 1) Acute alcoholic pancreatitis [extensive peripancreatic inflammation with  pancreatic ascites: MCV is high: would be helpful to check a B12 and a Folate level along with GGT. Patient admits being enrolled in AA before from 2001-2009 [when she was drinking a pint of rum day].  2) Rectal bleeding: will monitor CBC's closely for now. May need a flex sig if she continues to bleed.   3) Fatty liver disease noted on CT. 4) GERD/Hiatal hernia on PPI's. 5) Bipolar disorder. 6) Tobacco abuse. 7) Hyperglycemia-check fasting HbA1c.  Yago Ludvigsen 08/13/2012, 3:03 PM

## 2012-08-14 DIAGNOSIS — K921 Melena: Secondary | ICD-10-CM | POA: Diagnosis present

## 2012-08-14 DIAGNOSIS — R1084 Generalized abdominal pain: Secondary | ICD-10-CM

## 2012-08-14 DIAGNOSIS — R1013 Epigastric pain: Secondary | ICD-10-CM

## 2012-08-14 DIAGNOSIS — F1010 Alcohol abuse, uncomplicated: Secondary | ICD-10-CM | POA: Diagnosis present

## 2012-08-14 DIAGNOSIS — K709 Alcoholic liver disease, unspecified: Secondary | ICD-10-CM

## 2012-08-14 DIAGNOSIS — K859 Acute pancreatitis without necrosis or infection, unspecified: Principal | ICD-10-CM

## 2012-08-14 HISTORY — DX: Melena: K92.1

## 2012-08-14 HISTORY — DX: Alcohol abuse, uncomplicated: F10.10

## 2012-08-14 LAB — COMPREHENSIVE METABOLIC PANEL
BUN: 13 mg/dL (ref 6–23)
CO2: 22 mEq/L (ref 19–32)
Calcium: 8.4 mg/dL (ref 8.4–10.5)
Creatinine, Ser: 0.58 mg/dL (ref 0.50–1.10)
GFR calc Af Amer: >90 mL/min (ref >90)
GFR calc non Af Amer: >90 mL/min (ref >90)
Glucose, Bld: 78 mg/dL (ref 70–99)

## 2012-08-14 LAB — VITAMIN B12: Vitamin B-12: 1937 pg/mL — ABNORMAL HIGH (ref 211–911)

## 2012-08-14 LAB — CBC
MCV: 108.3 fL — ABNORMAL HIGH (ref 78.0–100.0)
Platelets: 144 10*3/uL — ABNORMAL LOW (ref 150–400)
RDW: 14.6 % (ref 11.5–15.5)
WBC: 4.3 10*3/uL (ref 4.0–10.5)

## 2012-08-14 LAB — MAGNESIUM: Magnesium: 1.6 mg/dL (ref 1.5–2.5)

## 2012-08-14 LAB — TSH: TSH: 2.414 u[IU]/mL (ref 0.350–4.500)

## 2012-08-14 MED ORDER — POTASSIUM CHLORIDE IN NACL 40-0.9 MEQ/L-% IV SOLN
INTRAVENOUS | Status: DC
  Administered 2012-08-14 – 2012-08-15 (×4): via INTRAVENOUS
  Filled 2012-08-14 (×6): qty 1000

## 2012-08-14 MED ORDER — VITAMIN B-1 100 MG PO TABS
100.0000 mg | ORAL_TABLET | Freq: Every day | ORAL | Status: DC
  Administered 2012-08-14 – 2012-08-15 (×2): 100 mg via ORAL
  Filled 2012-08-14 (×2): qty 1

## 2012-08-14 MED ORDER — LORAZEPAM 2 MG/ML IJ SOLN: 1.0000 mg | Freq: Four times a day (QID) | INTRAMUSCULAR | Status: DC | PRN

## 2012-08-14 MED ORDER — LORAZEPAM 1 MG PO TABS: 1.0000 mg | ORAL_TABLET | Freq: Four times a day (QID) | ORAL | Status: DC | PRN

## 2012-08-14 MED ORDER — THIAMINE HCL 100 MG/ML IJ SOLN
100.0000 mg | Freq: Every day | INTRAMUSCULAR | Status: DC
  Filled 2012-08-14 (×2): qty 1

## 2012-08-14 MED ORDER — ADULT MULTIVITAMIN W/MINERALS CH
1.0000 | ORAL_TABLET | Freq: Every day | ORAL | Status: DC
  Administered 2012-08-14 – 2012-08-15 (×2): 1 via ORAL
  Filled 2012-08-14 (×2): qty 1

## 2012-08-14 MED ORDER — FOLIC ACID 1 MG PO TABS
1.0000 mg | ORAL_TABLET | Freq: Every day | ORAL | Status: DC
  Administered 2012-08-14 – 2012-08-15 (×2): 1 mg via ORAL
  Filled 2012-08-14 (×2): qty 1

## 2012-08-14 NOTE — Progress Notes (Signed)
[facility] Progress Note  Subjective:  Says that pain is under control with pain meds but when they wear off pain still reaches 8/10.  Has been ambulating in room.  No nausea or vomiting.  Objective:  Vital signs in last 24 hours: Temp:  [98 F (36.7 C)-98.8 F (37.1 C)] 98.8 F (37.1 C) (06/23 0600) Pulse Rate:  [76-91] 76 (06/23 0600) Resp:  [20] 20 (06/23 0600) BP: (117-161)/(67-85) 117/75 mmHg (06/23 0600) SpO2:  [95 %-97 %] 95 % (06/23 0600)   General:   Alert, Well-developed, in NAD Heart:  Regular rate and rhythm; no murmurs Pulm:  CTAB.  No W/R/R. Abdomen:  Soft, minimally distended in upper abdomen.  BS present but hypoactive.  Upper abdominal TTP without R/R/G. Extremities:  Without edema. Neurologic:  Alert and  oriented x4;  grossly normal neurologically. Psych:  Alert and cooperative. Normal mood and affect.  Intake/Output from previous day: 06/22 0701 - 06/23 0700 In: 1600 [I.V.:1600] Out: -   Lab Results:  Recent Labs  08/12/12 1043 08/14/12 0521  WBC 12.5* 4.3  HGB 15.7* 10.9*  HCT 46.0 32.5*  PLT 236 144*   BMET  Recent Labs  08/12/12 1045 08/13/12 0545 08/14/12 0521  NA 132* 133* 134*  K 4.4 3.2* 3.5  CL 92* 101 103  CO2 24 22 22   GLUCOSE 213* 129* 78  BUN 19 18 13   CREATININE 1.71* 0.78 0.58  CALCIUM 10.0 8.2* 8.4   LFT  Recent Labs  08/14/12 0521  PROT 6.3  ALBUMIN 2.2*  AST 46*  ALT 39*  ALKPHOS 98  BILITOT 1.2   US Abdomen Complete  08/12/2012   *RADIOLOGY REPORT*  Clinical Data:  Right upper quadrant pain  COMPLETE ABDOMINAL ULTRASOUND  Comparison:  CT 05/13/2011, CT 09/03/2009  Findings:  Gallbladder:  No gallstones, gallbladder wall thickening, or pericholecystic fluid.  Common bile duct:  Upper limits of normal in diameter 6 mm.  Liver:  Liver is densely echogenic which limits evaluation.  No evidence of dilatation.  IVC:  Appears normal.  Pancreas:  Poorly evaluated and heterogeneous.  There appears to be a  fluid collection between the stomach and pancreas.  Spleen:  Normal in size and echogenicity.  Right Kidney:  11.10cm in length.  No evidence of hydronephrosis or stones.  Left Kidney:  12.0cm in length.  No evidence of hydronephrosis or stones.  Abdominal aorta:  No aneurysm identified.  Left upper quadrant, there is an 8 cm x 4 cm x 80 cm complex cystic structure which may represent a pseudocyst.  IMPRESSION:  1.  Densely echogenic liver either represents hepatic steatosis or hepatocellular disease. 2.  Potential 8 cm fluid collection in the left upper quadrant. This may represent a pancreatic pseudocyst.  Consider CT abdomen and pelvis with contrast for further evaluation. 3.  Small potential fluid between the pancreas and the stomach. 4.  Pancreas is poorly evaluated.   Original Report Authenticated By: Genevive Bi, M.D.   Ct Abdomen Pelvis W Contrast  08/12/2012   *RADIOLOGY REPORT*  Clinical Data: Left upper quadrant pain and emesis  CT ABDOMEN AND PELVIS WITH CONTRAST  Technique:  Multidetector CT imaging of the abdomen and pelvis was performed following the standard protocol during bolus administration of intravenous contrast.  Contrast: 50mL OMNIPAQUE IOHEXOL 300 MG/ML  SOLN, OMNIPAQUE IOHEXOL 300 MG/ML  SOLN  Comparison: 09/03/2009  Findings:  No pleural or pericardial effusion.  The lung bases are clear.  There is diffuse fatty  infiltration throughout the liver parenchyma.  Gallbladder appears within normal limits.  No biliary dilatation.  There is diffuse edema and inflammation involving the pancreas compatible with acute pancreatitis.  Fluid is extending into the lesser sac and right perinephric space.  No focal fluid collections are identified.  Normal appearance of the spleen.  The splenic artery and vein remains patent.  The portal vein is patent.  Normal appearance of the left adrenal gland.  Calcifications involving the right adrenal gland noted.  The right kidney is normal.  The left  kidney is normal.  Urinary bladder is unremarkable.  The stomach is normal.  The small bowel loops have a normal course and caliber without evidence for obstruction.  Normal appearance of the colon.  Review of the visualized bony structures is unremarkable.  IMPRESSION:  1.  Exam is positive for acute pancreatitis.  There is marked peripancreatic inflammation and fluid.  Fluid is noted extending into the lesser sac and right perinephric space. No discrete fluid collection identified to suggest pseudocyst. 2.  Fatty infiltration of the liver.   Original Report Authenticated By: Signa Kell, M.D.    Assessment / Plan: 1) Acute alcoholic pancreatitis:  extensive peripancreatic inflammation with pancreatic ascites.  Lipase 108 today.  MCV is high, will check B12 and folate levels.  Will decrease fluids to 150 cc/hr.  Can have ice chips and ambulate as tolerated. 2) Rectal bleeding with history of solitary rectal ulcer on colonoscopy 03/2011.  Work-up was NEGATIVE for IBD last year.  Will monitor Hgb closely for now. No bleeding as inpatient but has it intermittently at home.  May need a flex sig as outpatient upon follow-up. 3) Fatty liver disease noted on CT.  4) GERD/Hiatal hernia on PPI's.  5) Bipolar disorder.  6) Tobacco abuse.  7) Hyperglycemia    LOS: 2 days   ZEHR, JESSICA D.  08/14/2012, 7:57 AM  Pager number [phone]   GI ATTENDING  History, laboratories, x-rays reviewed. Patient personally seen and examined. Agree with history and physical as outlined above.  IMPRESSION 1. Acute alcoholic pancreatitis. Interstitial. Mild to moderate 2. Alcoholic liver disease as manifested by transaminase abnormalities, elevated MCV, and fatty replacement of liver on CT 3. General medical problems  RECOMMENDATIONS 1. Hydration 2. Pain control 3. Stop drinking alcohol for ever. May need alcohol rehabilitation 4. Advance diet as tolerated  Will follow.  Wilhemina Bonito. Eda Keys., M.D. [facility] Division of Gastroenterology

## 2012-08-14 NOTE — Progress Notes (Signed)
Nutrition Brief Note  Patient identified on the Malnutrition Screening Tool (MST) Report  Body mass index is 32.62 kg/(m^2). Patient meets criteria for Obese based on current BMI. Pt reports that she has been trying to lose weight, lost 45 lbs in the past year, and has recently been weighing around 210 lbs. Recent weight was 196 lbs 08/12/12. Pt states that she would like to get her weight down to 150 lbs.  Current diet order is NPO. Pt reports that nausea is improving and pain is being managed. Pt states that her appetite is good. Pt requested weight loss tips and general tips for a healthy diet.   RD provided "Weight Loss Tips" handout and "Fat Restricted Diet" handout from the Academy of [employer]etics. Discussed importance of controlled and consistent intake throughout the day. Provided examples of ways to balance meals/snacks and encouraged intake of high-fiber, whole grain complex carbohydrates (also reviewed certain high-fiber foods pt should avoid due to history of ulcerative colitis). Encouraged intake of fresh, frozen, or canned fruits and vegetables throughout the day. Emphasized the importance of hydration with calorie-free beverages and limiting sugar-sweetened beverages. Encouraged pt to avoid high-fat foods and provided list of low-fat recommended food choices. Encouraged pt to discuss physical activity options with physician. Teach back method used.  Expect good compliance.  Labs and medications reviewed. No further nutrition interventions warranted at this time. RD contact information provided. If additional nutrition issues arise, please re-consult RD.  Ian Malkin RD, LDN Inpatient Clinical Dietitian Pager: [phone] After Hours Pager: [phone]

## 2012-08-14 NOTE — Progress Notes (Signed)
[facility] PROGRESS NOTE  [name] [name] ZOX:[id number] DOB: August 14, 1960 DOA: 08/12/2012 PCP: [name], MD  Assessment/Plan: Principal Problem:   Pseudocyst of pancreas Active Problems:   ULCERATIVE COLITIS   GERD (gastroesophageal reflux disease)   Acute pancreatitis    1. Acute Pancreatitis: Patient presented with 4 days of vomiting, and 2 days of upper abdominal pain, radiating round to the back. Lipase was elevated at 1858, and abdominal/pelvic CT scan revealed acute pancreatitis, with marked peripancreatic inflammation and fluid, but no discrete fluid collection identified to suggest pseudocyst. Patient had a leukocytosis of 12.5, but was afebrile, and has no obvious focus of infection, so this is likely reactive.  Managing with bowel rest, PPI, analgesics, antiemetics and aggressive iv fluids. Following lipase levels, which have trended down rather rapidly. Lipase is 108 today. Patient has no evidence of cholelithiasis, but now admits to heavy ETOH use,, after being confronted by Dr [name] on 08/13/12. Triglyceride is 110. Dr [name] provided GI consultation. Managing as recommended, and patient is feeling considerably better today. No vomiting. Perhaps, clears can be started on 08/15/12.  2. Query Ulcerative colitis: Patient was thought to have a history of ulcerative colitis, however, careful review of EMR by Dr Man, revealed that patient has no evidence of IBD. She did have a solitary colonoscopically confirmed rectal ulcer, for which she was managed with Canasa suppositories, till 6 months ago. Per patient, she usually has 5-7 stools/day at baseline, sometimes with intermittent blood in stools. Endoscopic evaluation in 2013, revealed hemorrhoids. Per GI, if patient remains symptomatic, Flexible Sigmoidoscopy may be considered.   3. Hyponatremia: This was mild at 132 at presentation, and attributable to volume depletion. Managing with NS. Improved. 4. Hypokalemia: Repleting as  indicated.  5. GERD: On PPI.  6. HTN: BP was borderline at 95/60 at presentation, but improved to 127/79, with rehydration. Pre-admission antihypertensives are on hold, and as BP crept up on 08/13/12, parenteral antihypertensive are being utilized, to good effect.  7. Query DM: Patient has previously been diagnosed as "pre-diabetic". Suspect this is part of a metabolic syndrome. Random blood glucose was normal at 129. HBA1C is pending.   Code Status: Full Code.  Family Communication:  Disposition Plan: To be determined.    Brief narrative: 52 year old female with history of HTN, DM, CKD, pre-diabetes, chronic anemia, Hiatal hernia, GERD, obesity, Bipolar disorder, ulcerative colitis, pancreatitis in 1980 and an episode of "gastritis"in April 2014. She describes band-like pain in her left upper quadrant, beneath her ribs and has not been able to tolerate any liquids or solid food for the last couple of days. Patient also states that she's developed borderline diabetes since April and her primary care doctor is following it closely. She states that she has ulcerative colitis, is followed by Dr. [name] and has had 4-5 occasionally bloody stools, which is normal for her. She states that she's been eating healthy and has lost 45 pounds in the last one year. Admitted for further management.    Consultants:  Dr [name], GI.   Procedures:  Abdominal U/S.   Abdominal/pelvic CT scan.   Antibiotics:  N/A.   HPI/Subjective: Feels better, abdominal pain is much less.   Objective: Vital signs in last 24 hours: Temp:  [98 F (36.7 C)-98.8 F (37.1 C)] 98.8 F (37.1 C) (06/23 0600) Pulse Rate:  [76-91] 76 (06/23 0600) Resp:  [20] 20 (06/23 0600) BP: (117-161)/(67-85) 117/75 mmHg (06/23 0600) SpO2:  [95 %-97 %] 95 % (06/23 0600) Weight  change:  Last BM Date: 08/10/12  Intake/Output from previous day: 06/22 0701 - 06/23 0700 In: 1600 [I.V.:1600] Out: -      Physical  Exam: General: Comfortable, alert, communicative, fully oriented, not short of breath at rest.  HEENT:  No clinical pallor, no jaundice, no conjunctival injection or discharge. Hydration is satisfactory.  NECK:  Supple, JVP not seen, no carotid bruits, no palpable lymphadenopathy, no palpable goiter. CHEST:  Clinically clear to auscultation, no wheezes, no crackles. HEART:  Sounds 1 and 2 heard, normal, regular, no murmurs. ABDOMEN:  Moderately obese, soft, non-tender, no palpable organomegaly, no palpable masses, normal bowel sounds. GENITALIA:  Not examined. LOWER EXTREMITIES:  No pitting edema, palpable peripheral pulses. MUSCULOSKELETAL SYSTEM:  unremarkable. CENTRAL NERVOUS SYSTEM:  No focal neurologic deficit on gross examination.  Lab Results:  Recent Labs  08/12/12 1043 08/14/12 0521  WBC 12.5* 4.3  HGB 15.7* 10.9*  HCT 46.0 32.5*  PLT 236 144*    Recent Labs  08/13/12 0545 08/14/12 0521  NA 133* 134*  K 3.2* 3.5  CL 101 103  CO2 22 22  GLUCOSE 129* 78  BUN 18 13  CREATININE 0.78 0.58  CALCIUM 8.2* 8.4   Recent Results (from the past 240 hour(s))  URINE CULTURE     Status: None   Collection Time    08/12/12  2:07 PM      Result Value Range Status   Specimen Description URINE, CLEAN CATCH   Final   Special Requests Normal   Final   Culture  Setup Time 08/12/2012 21:11   Final   Colony Count 65,000 COLONIES/ML   Final   Culture     Final   Value: Multiple bacterial morphotypes present, none predominant. Suggest appropriate recollection if clinically indicated.   Report Status 08/13/2012 FINAL   Final     Studies/Results: US Abdomen Complete  08/12/2012   *RADIOLOGY REPORT*  Clinical Data:  Right upper quadrant pain  COMPLETE ABDOMINAL ULTRASOUND  Comparison:  CT 05/13/2011, CT 09/03/2009  Findings:  Gallbladder:  No gallstones, gallbladder wall thickening, or pericholecystic fluid.  Common bile duct:  Upper limits of normal in diameter 6 mm.  Liver:  Liver  is densely echogenic which limits evaluation.  No evidence of dilatation.  IVC:  Appears normal.  Pancreas:  Poorly evaluated and heterogeneous.  There appears to be a fluid collection between the stomach and pancreas.  Spleen:  Normal in size and echogenicity.  Right Kidney:  11.10cm in length.  No evidence of hydronephrosis or stones.  Left Kidney:  12.0cm in length.  No evidence of hydronephrosis or stones.  Abdominal aorta:  No aneurysm identified.  Left upper quadrant, there is an 8 cm x 4 cm x 80 cm complex cystic structure which may represent a pseudocyst.  IMPRESSION:  1.  Densely echogenic liver either represents hepatic steatosis or hepatocellular disease. 2.  Potential 8 cm fluid collection in the left upper quadrant. This may represent a pancreatic pseudocyst.  Consider CT abdomen and pelvis with contrast for further evaluation. 3.  Small potential fluid between the pancreas and the stomach. 4.  Pancreas is poorly evaluated.   Original Report Authenticated By: Genevive Bi, M.D.   Ct Abdomen Pelvis W Contrast  08/12/2012   *RADIOLOGY REPORT*  Clinical Data: Left upper quadrant pain and emesis  CT ABDOMEN AND PELVIS WITH CONTRAST  Technique:  Multidetector CT imaging of the abdomen and pelvis was performed following the standard protocol during bolus administration of  intravenous contrast.  Contrast: 50mL OMNIPAQUE IOHEXOL 300 MG/ML  SOLN, OMNIPAQUE IOHEXOL 300 MG/ML  SOLN  Comparison: 09/03/2009  Findings:  No pleural or pericardial effusion.  The lung bases are clear.  There is diffuse fatty infiltration throughout the liver parenchyma.  Gallbladder appears within normal limits.  No biliary dilatation.  There is diffuse edema and inflammation involving the pancreas compatible with acute pancreatitis.  Fluid is extending into the lesser sac and right perinephric space.  No focal fluid collections are identified.  Normal appearance of the spleen.  The splenic artery and vein remains patent.   The portal vein is patent.  Normal appearance of the left adrenal gland.  Calcifications involving the right adrenal gland noted.  The right kidney is normal.  The left kidney is normal.  Urinary bladder is unremarkable.  The stomach is normal.  The small bowel loops have a normal course and caliber without evidence for obstruction.  Normal appearance of the colon.  Review of the visualized bony structures is unremarkable.  IMPRESSION:  1.  Exam is positive for acute pancreatitis.  There is marked peripancreatic inflammation and fluid.  Fluid is noted extending into the lesser sac and right perinephric space. No discrete fluid collection identified to suggest pseudocyst. 2.  Fatty infiltration of the liver.   Original Report Authenticated By: Signa Kell, M.D.    Medications: Scheduled Meds: . ARIPiprazole  15 mg Oral Daily  . enoxaparin (LOVENOX) injection  40 mg Subcutaneous Q24H  . FLUoxetine  20 mg Oral BID  . fluticasone  2 spray Each Nare Daily  . folic acid  1 mg Oral Daily  . lamoTRIgine  100 mg Oral Daily  . multivitamin with minerals  1 tablet Oral Daily  . pantoprazole (PROTONIX) IV  40 mg Intravenous Q24H  . sodium chloride  3 mL Intravenous Q12H  . thiamine  100 mg Oral Daily   Or  . thiamine  100 mg Intravenous Daily   Continuous Infusions: . sodium chloride 200 mL/hr at 08/14/12 0938   PRN Meds:.HYDROmorphone (DILAUDID) injection, LORazepam, LORazepam, ondansetron    LOS: 2 days   Arlyn Bumpus,CHRISTOPHER  [facility] Pager [phone]. If 8PM-8AM, please contact night-coverage at [email or web address], password [facility] 08/14/2012, 11:47 AM  LOS: 2 days

## 2012-08-15 DIAGNOSIS — R52 Pain, unspecified: Secondary | ICD-10-CM

## 2012-08-15 DIAGNOSIS — F319 Bipolar disorder, unspecified: Secondary | ICD-10-CM

## 2012-08-15 LAB — CBC
HCT: 33.3 % — ABNORMAL LOW (ref 36.0–46.0)
Hemoglobin: 10.9 g/dL — ABNORMAL LOW (ref 12.0–15.0)
MCH: 35.5 pg — ABNORMAL HIGH (ref 26.0–34.0)
MCHC: 32.7 g/dL (ref 30.0–36.0)
RDW: 14.7 % (ref 11.5–15.5)

## 2012-08-15 LAB — FOLATE: Folate: 9.8 ng/mL

## 2012-08-15 LAB — COMPREHENSIVE METABOLIC PANEL
ALT: 68 U/L — ABNORMAL HIGH (ref 0–35)
AST: 118 U/L — ABNORMAL HIGH (ref 0–37)
BUN: 7 mg/dL (ref 6–23)
Calcium: 8.6 mg/dL (ref 8.4–10.5)
GFR calc non Af Amer: >90 mL/min (ref >90)
Glucose, Bld: 156 mg/dL — ABNORMAL HIGH (ref 70–99)
Potassium: 4 mEq/L (ref 3.5–5.1)
Total Bilirubin: 1.7 mg/dL — ABNORMAL HIGH (ref 0.3–1.2)
Total Protein: 6.4 g/dL (ref 6.0–8.3)

## 2012-08-15 LAB — FOLATE RBC: RBC Folate: 299 ng/mL — ABNORMAL LOW (ref >=366)

## 2012-08-15 LAB — TSH: TSH: 5.471 u[IU]/mL — ABNORMAL HIGH (ref 0.350–4.500)

## 2012-08-15 MED ORDER — OXYCODONE HCL 5 MG PO CAPS: 5.0000 mg | ORAL_CAPSULE | ORAL | Status: DC | PRN

## 2012-08-15 MED ORDER — BISACODYL 10 MG RE SUPP: 10.0000 mg | Freq: Once | RECTAL | Status: DC

## 2012-08-15 MED ORDER — FOLIC ACID 1 MG PO TABS: 1.0000 mg | ORAL_TABLET | Freq: Every day | ORAL | Status: DC

## 2012-08-15 MED ORDER — THIAMINE HCL 100 MG PO TABS: 100.0000 mg | ORAL_TABLET | Freq: Every day | ORAL | Status: DC

## 2012-08-15 MED ORDER — LISINOPRIL 20 MG PO TABS
20.0000 mg | ORAL_TABLET | Freq: Every day | ORAL | Status: DC
  Administered 2012-08-15: 20 mg via ORAL
  Filled 2012-08-15: qty 1

## 2012-08-15 MED ORDER — LISINOPRIL 20 MG PO TABS: 20.0000 mg | ORAL_TABLET | Freq: Every day | ORAL | Status: DC

## 2012-08-15 NOTE — Discharge Summary (Signed)
Physician Discharge Summary  [name] [name] [name] DOB: February 05, 1961 DOA: 08/12/2012  PCP: [name], MD  Admit date: 08/12/2012 Discharge date: 08/15/2012  Time spent: 40 minutes  Recommendations for Outpatient Follow-up:  1. Follow up with primary MD.  2. Follow up with Dr [name], primary gastroenterologist.   Discharge Diagnoses:  Principal Problem:   Pseudocyst of pancreas Active Problems:   ULCERATIVE COLITIS   GERD (gastroesophageal reflux disease)   Acute pancreatitis   Hematochezia   ETOH abuse   Discharge Condition: Satisfactory.   Diet recommendation: Low fat diet.   Filed Weights   08/12/12 1714  Weight: 88.905 kg (196 lb)    History of present illness:  52 year old female with history of HTN, DM, CKD, pre-diabetes, chronic anemia, Hiatal hernia, GERD, obesity, Bipolar disorder, ulcerative colitis, pancreatitis in 1980 and an episode of "gastritis"in April 2014. She describes band-like pain in her left upper quadrant, beneath her ribs and has not been able to tolerate any liquids or solid food for the last couple of days. Patient also states that she's developed borderline diabetes since April and her primary care doctor is following it closely. She states that she has ulcerative colitis, is followed by Dr. [name] and has had 4-5 occasionally bloody stools, which is normal for her. She states that she's been eating healthy and has lost 45 pounds in the last one year. Admitted for further management.    Hospital Course:  1. Acute Pancreatitis: Patient presented with 4 days of vomiting, and 2 days of upper abdominal pain, radiating round to the back. Lipase was elevated at 1858, and abdominal/pelvic CT scan revealed acute pancreatitis, with marked peripancreatic inflammation and fluid, but no discrete fluid collection identified to suggest pseudocyst. Patient had a leukocytosis of 12.5, but was afebrile, and had no obvious focus of  infection, so this is likely reactive. Managed with bowel rest, PPI, analgesics, antiemetics and aggressive iv fluids, with dramatic clinical response. Lipase levels trended down rather rapidly. Lipase was 108 on 08/14/12, and dropped to 66 by 08/15/12. Patient has no evidence of cholelithiasis, but admitted to heavy ETOH use, after being confronted by Dr [name] on 08/13/12. Triglyceride is 110. Dr [name] provided GI consultation. As of 08/15/12, patient was pain-free, tolerating clears, and was advanced to low fat, diet, without deleterious effect.  2. Query Ulcerative colitis: Patient was thought to have a history of ulcerative colitis, however, careful review of EMR by Dr [name], revealed that patient has no evidence of IBD. She did have a solitary colonoscopically confirmed rectal ulcer in February 2013, for which she was managed with Canasa suppositories till 6 months ago. Per patient, she usually has 5-7 stools/day at baseline, sometimes with intermittent blood in stools, and endoscopic evaluation in 2013, revealed hemorrhoids. Per GI, if patient remained symptomatic, Flexible Sigmoidoscopy may be considered. As it turned out. patient had no hematochezia during the course of her hospitalization. She will follow up with Dr [name], her primary gastroenterologist, on discharge.  3. Hyponatremia: This was mild at 132 at presentation, and attributable to volume depletion. Managed with NS. Improved.  4. Hypokalemia: Repleted as indicated. Resolved.  5. GERD: Asymptomatic on PPI.  6. HTN: BP was borderline at 95/60 at presentation, but improved to 127/79, with rehydration. Pre-admission antihypertensives were initially placed on hold, but as BP crept up on 08/13/12, parenteral antihypertensive was utilized, to good effect. Lisinopril was restarted on 08/15/12. HCTZ will not be recommenced, in view of recent pancreatitis.  7.  Query DM: Patient has previously been diagnosed as "pre-diabetic". Suspect this is part of  a metabolic syndrome. Random blood glucose was normal at 129. HBA1C was ordered, but result is pending.  8. ETOH abuse: As discussed in #1 above, patient does admit to rather heavy drinking. She has been counseled appropriately. On Thiamine supplements. She was placed on CIWA protocol during her hospitalization, but fortunately, showed no features of ETOH withdrawal.    Procedures:  See Below.   Consultations:  Dr [name], GI.   Discharge Exam: Filed Vitals:   08/14/12 2206 08/15/12 0600 08/15/12 1343 08/15/12 1501  BP: 130/83 167/98 122/82 149/89  Pulse: 74 74 85 78  Temp: 98.6 F (37 C) 98.6 F (37 C)  98.4 F (36.9 C)  TempSrc: Oral Oral  Oral  Resp: 20 20 20 20   Height:      Weight:      SpO2: 95% 98%  99%    General: Comfortable, alert, communicative, fully oriented, not short of breath at rest.  HEENT: No clinical pallor, no jaundice, no conjunctival injection or discharge. Hydration is satisfactory.  NECK: Supple, JVP not seen, no carotid bruits, no palpable lymphadenopathy, no palpable goiter.  CHEST: Clinically clear to auscultation, no wheezes, no crackles.  HEART: Sounds 1 and 2 heard, normal, regular, no murmurs.  ABDOMEN: Moderately obese, soft, non-tender, no palpable organomegaly, no palpable masses, normal bowel sounds.  GENITALIA: Not examined.  LOWER EXTREMITIES: No pitting edema, palpable peripheral pulses.  MUSCULOSKELETAL SYSTEM: unremarkable.  CENTRAL NERVOUS SYSTEM: No focal neurologic deficit on gross examination.  Discharge Instructions      Discharge Orders   Future Orders Complete By Expires     Diet - low sodium heart healthy  As directed     Increase activity slowly  As directed         Medication List    STOP taking these medications       lisinopril-hydrochlorothiazide 10-12.5 MG per tablet  Commonly known as:  PRINZIDE,ZESTORETIC      TAKE these medications       ARIPiprazole 15 MG tablet  Commonly known as:  ABILIFY   Take 15 mg by mouth daily.     cetirizine 10 MG tablet  Commonly known as:  ZYRTEC  Take 10 mg by mouth daily.     FLUoxetine 20 MG tablet  Commonly known as:  PROZAC  Take 20 mg by mouth 2 (two) times daily.     fluticasone 50 MCG/ACT nasal spray  Commonly known as:  FLONASE  Place 2 sprays into the nose daily.     folic acid 1 MG tablet  Commonly known as:  FOLVITE  Take 1 tablet (1 mg total) by mouth daily.     HYDROcodone-acetaminophen 5-325 MG per tablet  Commonly known as:  NORCO/VICODIN  Take 1 tablet by mouth every 8 (eight) hours as needed for pain.     lamoTRIgine 100 MG tablet  Commonly known as:  LAMICTAL  Take 100 mg by mouth daily.     lisinopril 20 MG tablet  Commonly known as:  PRINIVIL,ZESTRIL  Take 1 tablet (20 mg total) by mouth daily.     oxycodone 5 MG capsule  Commonly known as:  OXY-IR  Take 1 capsule (5 mg total) by mouth every 4 (four) hours as needed.     pantoprazole 40 MG tablet  Commonly known as:  PROTONIX  Take 40 mg by mouth 2 (two) times daily.  pravastatin 40 MG tablet  Commonly known as:  PRAVACHOL  Take 40 mg by mouth daily.     promethazine 12.5 MG suppository  Commonly known as:  PHENERGAN  Place 1 suppository (12.5 mg total) rectally every 4 (four) hours as needed for nausea.     thiamine 100 MG tablet  Take 1 tablet (100 mg total) by mouth daily.       No Known Allergies Follow-up Information   Schedule an appointment as soon as possible for a visit with [name], MD.   Contact information:   [address] [place] [address] [phone]       Schedule an appointment as soon as possible for a visit with [name], MD.   Contact information:   [address] [place] [address] [phone]        The results of significant diagnostics from this hospitalization (including imaging, microbiology, ancillary and laboratory) are listed below for reference.     Significant Diagnostic Studies: US Abdomen Complete  08/12/2012   *RADIOLOGY REPORT*  Clinical Data:  Right upper quadrant pain  COMPLETE ABDOMINAL ULTRASOUND  Comparison:  CT 05/13/2011, CT 09/03/2009  Findings:  Gallbladder:  No gallstones, gallbladder wall thickening, or pericholecystic fluid.  Common bile duct:  Upper limits of normal in diameter 6 mm.  Liver:  Liver is densely echogenic which limits evaluation.  No evidence of dilatation.  IVC:  Appears normal.  Pancreas:  Poorly evaluated and heterogeneous.  There appears to be a fluid collection between the stomach and pancreas.  Spleen:  Normal in size and echogenicity.  Right Kidney:  11.10cm in length.  No evidence of hydronephrosis or stones.  Left Kidney:  12.0cm in length.  No evidence of hydronephrosis or stones.  Abdominal aorta:  No aneurysm identified.  Left upper quadrant, there is an 8 cm x 4 cm x 80 cm complex cystic structure which may represent a pseudocyst.  IMPRESSION:  1.  Densely echogenic liver either represents hepatic steatosis or hepatocellular disease. 2.  Potential 8 cm fluid collection in the left upper quadrant. This may represent a pancreatic pseudocyst.  Consider CT abdomen and pelvis with contrast for further evaluation. 3.  Small potential fluid between the pancreas and the stomach. 4.  Pancreas is poorly evaluated.   Original Report Authenticated By: Genevive Bi, M.D.   Ct Abdomen Pelvis W Contrast  08/12/2012   *RADIOLOGY REPORT*  Clinical Data: Left upper quadrant pain and emesis  CT ABDOMEN AND PELVIS WITH CONTRAST  Technique:  Multidetector CT imaging of the abdomen and pelvis was performed following the standard protocol during bolus administration of intravenous contrast.  Contrast: 50mL OMNIPAQUE IOHEXOL 300 MG/ML  SOLN, OMNIPAQUE IOHEXOL 300 MG/ML  SOLN  Comparison: 09/03/2009  Findings:  No pleural or pericardial effusion.  The lung bases are clear.  There is diffuse fatty infiltration throughout the  liver parenchyma.  Gallbladder appears within normal limits.  No biliary dilatation.  There is diffuse edema and inflammation involving the pancreas compatible with acute pancreatitis.  Fluid is extending into the lesser sac and right perinephric space.  No focal fluid collections are identified.  Normal appearance of the spleen.  The splenic artery and vein remains patent.  The portal vein is patent.  Normal appearance of the left adrenal gland.  Calcifications involving the right adrenal gland noted.  The right kidney is normal.  The left kidney is normal.  Urinary bladder is unremarkable.  The stomach  is normal.  The small bowel loops have a normal course and caliber without evidence for obstruction.  Normal appearance of the colon.  Review of the visualized bony structures is unremarkable.  IMPRESSION:  1.  Exam is positive for acute pancreatitis.  There is marked peripancreatic inflammation and fluid.  Fluid is noted extending into the lesser sac and right perinephric space. No discrete fluid collection identified to suggest pseudocyst. 2.  Fatty infiltration of the liver.   Original Report Authenticated By: Signa Kell, M.D.    Microbiology: Recent Results (from the past 240 hour(s))  URINE CULTURE     Status: None   Collection Time    08/12/12  2:07 PM      Result Value Range Status   Specimen Description URINE, CLEAN CATCH   Final   Special Requests Normal   Final   Culture  Setup Time 08/12/2012 21:11   Final   Colony Count 65,000 COLONIES/ML   Final   Culture     Final   Value: Multiple bacterial morphotypes present, none predominant. Suggest appropriate recollection if clinically indicated.   Report Status 08/13/2012 FINAL   Final     Labs: Basic Metabolic Panel:  Recent Labs Lab 08/12/12 1045 08/13/12 0545 08/14/12 0521 08/15/12 0457  NA 132* 133* 134* 130*  K 4.4 3.2* 3.5 4.0  CL 92* 101 103 100  CO2 24 22 22 22   GLUCOSE 213* 129* 78 156*  BUN 19 18 13 7   CREATININE  1.71* 0.78 0.58 0.54  CALCIUM 10.0 8.2* 8.4 8.6  MG  --  1.5 1.6  --    Liver Function Tests:  Recent Labs Lab 08/12/12 1045 08/13/12 0545 08/14/12 0521 08/15/12 0457  AST 141* 71* 46* 118*  ALT 92* 54* 39* 68*  ALKPHOS 149* 109 98 189*  BILITOT 1.2 1.2 1.2 1.7*  PROT 8.4* 6.6 6.3 6.4  ALBUMIN 3.4* 2.5* 2.2* 2.3*    Recent Labs Lab 08/12/12 1045 08/13/12 0545 08/14/12 0521 08/15/12 0457  LIPASE 1858* 460* 108* 66*   No results found for this basename: AMMONIA,  in the last 168 hours CBC:  Recent Labs Lab 08/12/12 1043 08/14/12 0521 08/15/12 0457  WBC 12.5* 4.3 5.4  NEUTROABS 11.2*  --   --   HGB 15.7* 10.9* 10.9*  HCT 46.0 32.5* 33.3*  MCV 105.7* 108.3* 108.5*  PLT 236 144* 154   Cardiac Enzymes: No results found for this basename: CKTOTAL, CKMB, CKMBINDEX, TROPONINI,  in the last 168 hours BNP: BNP (last 3 results) No results found for this basename: PROBNP,  in the last 8760 hours CBG: No results found for this basename: GLUCAP,  in the last 168 hours     Signed:  Shellee Streng,CHRISTOPHER  [facility] 08/15/2012, 4:11 PM

## 2012-08-15 NOTE — Progress Notes (Signed)
[facility] PROGRESS NOTE  [name] [name] ZOX:[id number] DOB: 1960-12-05 DOA: 08/12/2012 PCP: [name], MD  Assessment/Plan: Principal Problem:   Pseudocyst of pancreas Active Problems:   ULCERATIVE COLITIS   GERD (gastroesophageal reflux disease)   Acute pancreatitis   Hematochezia   ETOH abuse    1. Acute Pancreatitis: Patient presented with 4 days of vomiting, and 2 days of upper abdominal pain, radiating round to the back. Lipase was elevated at 1858, and abdominal/pelvic CT scan revealed acute pancreatitis, with marked peripancreatic inflammation and fluid, but no discrete fluid collection identified to suggest pseudocyst. Patient had a leukocytosis of 12.5, but was afebrile, and has no obvious focus of infection, so this is likely reactive.  Managing with bowel rest, PPI, analgesics, antiemetics and aggressive iv fluids. Following lipase levels, which have trended down rather rapidly. Lipase was 108 on 08/14/12. Patient has no evidence of cholelithiasis, but now admits to heavy ETOH use,, after being confronted by Dr [name] on 08/13/12. Triglyceride is 110. Dr [name] provided GI consultation. Managing as recommended, and patient is clinically much improved. She is pain-free, tolerating clears, and appears ready to advance diet. No vomiting. Will start low fat, regular diet today.  2. Query Ulcerative colitis: Patient was thought to have a history of ulcerative colitis, however, careful review of EMR by Dr Man, revealed that patient has no evidence of IBD. She did have a solitary colonoscopically confirmed rectal ulcer, for which she was managed with Canasa suppositories, till 6 months ago. Per patient, she usually has 5-7 stools/day at baseline, sometimes with intermittent blood in stools. Endoscopic evaluation in 2013, revealed hemorrhoids. Per GI, if patient remains symptomatic, Flexible Sigmoidoscopy may be considered. Patient has had no hematochezia since admission. 3.  Hyponatremia: This was mild at 132 at presentation, and attributable to volume depletion. Managed with NS. Improved. 4. Hypokalemia: Repleting as indicated. Resolved. 5. GERD: On PPI.  6. HTN: BP was borderline at 95/60 at presentation, but improved to 127/79, with rehydration. Pre-admission antihypertensives are on hold, and as BP crept up on 08/13/12, parenteral antihypertensive are being utilized, to good effect. Have restarted Lisinopril today. HCTZ will not be recommenced, in view of recent pancreatitis.  7. Query DM: Patient has previously been diagnosed as "pre-diabetic". Suspect this is part of a metabolic syndrome. Random blood glucose was normal at 129. HBA1C is pending.   Code Status: Full Code.  Family Communication:  Disposition Plan: To be determined.    Brief narrative: 52 year old female with history of HTN, DM, CKD, pre-diabetes, chronic anemia, Hiatal hernia, GERD, obesity, Bipolar disorder, ulcerative colitis, pancreatitis in 1980 and an episode of "gastritis"in April 2014. She describes band-like pain in her left upper quadrant, beneath her ribs and has not been able to tolerate any liquids or solid food for the last couple of days. Patient also states that she's developed borderline diabetes since April and her primary care doctor is following it closely. She states that she has ulcerative colitis, is followed by Dr. [name] and has had 4-5 occasionally bloody stools, which is normal for her. She states that she's been eating healthy and has lost 45 pounds in the last one year. Admitted for further management.    Consultants:  Dr [name], GI.   Procedures:  Abdominal U/S.   Abdominal/pelvic CT scan.   Antibiotics:  N/A.   HPI/Subjective: No abdominal pain. No hematochezia. Wants to eat.   Objective: Vital signs in last 24 hours: Temp:  [98.6 F (37 C)-98.7  F (37.1 C)] 98.6 F (37 C) (06/24 0600) Pulse Rate:  [74-79] 74 (06/24 0600) Resp:  [20]  20 (06/24 0600) BP: (130-167)/(79-98) 167/98 mmHg (06/24 0600) SpO2:  [95 %-98 %] 98 % (06/24 0600) Weight change:  Last BM Date: 08/10/12  Intake/Output from previous day: 06/23 0701 - 06/24 0700 In: 390 [P.O.:240; I.V.:150] Out: -  Total I/O In: 600 [I.V.:600] Out: -    Physical Exam: General: Comfortable, alert, communicative, fully oriented, not short of breath at rest.  HEENT:  No clinical pallor, no jaundice, no conjunctival injection or discharge. Hydration is satisfactory.  NECK:  Supple, JVP not seen, no carotid bruits, no palpable lymphadenopathy, no palpable goiter. CHEST:  Clinically clear to auscultation, no wheezes, no crackles. HEART:  Sounds 1 and 2 heard, normal, regular, no murmurs. ABDOMEN:  Moderately obese, soft, non-tender, no palpable organomegaly, no palpable masses, normal bowel sounds. GENITALIA:  Not examined. LOWER EXTREMITIES:  No pitting edema, palpable peripheral pulses. MUSCULOSKELETAL SYSTEM:  unremarkable. CENTRAL NERVOUS SYSTEM:  No focal neurologic deficit on gross examination.  Lab Results:  Recent Labs  08/14/12 0521 08/15/12 0457  WBC 4.3 5.4  HGB 10.9* 10.9*  HCT 32.5* 33.3*  PLT 144* 154    Recent Labs  08/14/12 0521 08/15/12 0457  NA 134* 130*  K 3.5 4.0  CL 103 100  CO2 22 22  GLUCOSE 78 156*  BUN 13 7  CREATININE 0.58 0.54  CALCIUM 8.4 8.6   Recent Results (from the past 240 hour(s))  URINE CULTURE     Status: None   Collection Time    08/12/12  2:07 PM      Result Value Range Status   Specimen Description URINE, CLEAN CATCH   Final   Special Requests Normal   Final   Culture  Setup Time 08/12/2012 21:11   Final   Colony Count 65,000 COLONIES/ML   Final   Culture     Final   Value: Multiple bacterial morphotypes present, none predominant. Suggest appropriate recollection if clinically indicated.   Report Status 08/13/2012 FINAL   Final     Studies/Results: No results found.  Medications: Scheduled  Meds: . ARIPiprazole  15 mg Oral Daily  . enoxaparin (LOVENOX) injection  40 mg Subcutaneous Q24H  . FLUoxetine  20 mg Oral BID  . fluticasone  2 spray Each Nare Daily  . folic acid  1 mg Oral Daily  . lamoTRIgine  100 mg Oral Daily  . multivitamin with minerals  1 tablet Oral Daily  . pantoprazole (PROTONIX) IV  40 mg Intravenous Q24H  . sodium chloride  3 mL Intravenous Q12H  . thiamine  100 mg Oral Daily   Or  . thiamine  100 mg Intravenous Daily   Continuous Infusions: . 0.9 % NaCl with KCl 40 mEq / L 150 mL/hr at 08/15/12 1100   PRN Meds:.HYDROmorphone (DILAUDID) injection, LORazepam, LORazepam, ondansetron    LOS: 3 days   Elliona Doddridge,CHRISTOPHER  [facility] Pager [phone]. If 8PM-8AM, please contact night-coverage at [email or web address], password [email or web address] 08/15/2012, 12:42 PM  LOS: 3 days

## 2012-08-15 NOTE — Progress Notes (Signed)
[facility] Progress Note   Subjective  Overall better. Still with frequent abdominal pain but pain meds able to control it better. Hungry. Taking clears   Objective   Vital signs in last 24 hours: Temp:  [98.6 F (37 C)-98.7 F (37.1 C)] 98.6 F (37 C) (06/24 0600) Pulse Rate:  [74-79] 74 (06/24 0600) Resp:  [20] 20 (06/24 0600) BP: (130-167)/(79-98) 167/98 mmHg (06/24 0600) SpO2:  [95 %-98 %] 98 % (06/24 0600) Last BM Date: 08/10/12 General:    white female in NAD Heart:  Regular rate and rhythm Lungs: Respirations even and unlabored, lungs CTA bilaterally Abdomen:  Soft, nontender and nondistended. Normal bowel sounds. Extremities:  Without edema. Neurologic:  Alert and oriented,  grossly normal neurologically. Psych:  Cooperative. Normal mood and affect.   Lab Results:  Recent Labs  08/14/12 0521 08/15/12 0457  WBC 4.3 5.4  HGB 10.9* 10.9*  HCT 32.5* 33.3*  PLT 144* 154   BMET  Recent Labs  08/13/12 0545 08/14/12 0521 08/15/12 0457  NA 133* 134* 130*  K 3.2* 3.5 4.0  CL 101 103 100  CO2 22 22 22   GLUCOSE 129* 78 156*  BUN 18 13 7   CREATININE 0.78 0.58 0.54  CALCIUM 8.2* 8.4 8.6   LFT  Recent Labs  08/15/12 0457  PROT 6.4  ALBUMIN 2.3*  AST 118*  ALT 68*  ALKPHOS 189*  BILITOT 1.7*     Assessment / Plan:   1. Acute ETOH pancreatitis, improving. Diet has already been advanced to low fat.  2. Intermittent rectal bleeding at home, none this admission. Solitary rectal culer on colonoscopy Feb 2013.  IBD workup last year was negative.    LOS: 3 days   Willette Cluster  08/15/2012, 12:51 PM   GI ATTENDING  Patient seen and examined. Clinically stable with improvement in all parameters. Would be okay for discharge home. It was emphasized that she has alcohol-related pancreatitis as well as evidence for alcohol-related liver disease. Complete abstinence stressed. She should followup in the GI clinic with Dr. Jarold Motto in about 4  weeks.  Wilhemina Bonito. Eda Keys., M.D. [facility] Division of Gastroenterology

## 2012-08-15 NOTE — Progress Notes (Signed)
08/15/12 1635 Reviewed discharge instructions with patient. Patient verbalized understanding of discharge instructions. Copy of discharge instructions and prescriptions given to patient. Appointments were made prior to patient leaving for June 30th and July 1st patient aware and the appts were printed on discharge sheet.

## 2012-08-16 ENCOUNTER — Encounter: Payer: Self-pay | Admitting: Gastroenterology

## 2012-08-22 ENCOUNTER — Ambulatory Visit: Payer: Medicare Other | Admitting: Gastroenterology

## 2012-09-05 ENCOUNTER — Ambulatory Visit (INDEPENDENT_AMBULATORY_CARE_PROVIDER_SITE_OTHER): Payer: Medicare Other | Admitting: Gastroenterology

## 2012-09-05 ENCOUNTER — Encounter: Payer: Self-pay | Admitting: Gastroenterology

## 2012-09-05 VITALS — BP 106/74 | HR 66 | Wt 200.0 lb

## 2012-09-05 DIAGNOSIS — R7309 Other abnormal glucose: Secondary | ICD-10-CM

## 2012-09-05 DIAGNOSIS — K219 Gastro-esophageal reflux disease without esophagitis: Secondary | ICD-10-CM

## 2012-09-05 DIAGNOSIS — K859 Acute pancreatitis without necrosis or infection, unspecified: Secondary | ICD-10-CM

## 2012-09-05 DIAGNOSIS — F1020 Alcohol dependence, uncomplicated: Secondary | ICD-10-CM

## 2012-09-05 DIAGNOSIS — F319 Bipolar disorder, unspecified: Secondary | ICD-10-CM

## 2012-09-05 DIAGNOSIS — R7302 Impaired glucose tolerance (oral): Secondary | ICD-10-CM

## 2012-09-05 NOTE — Patient Instructions (Addendum)
  Please follow up in one year or sooner if symptoms reoccur. _____________________________________________________                                               We are excited to introduce MyChart, a new best-in-class service that provides you online access to important information in your electronic medical record. We want to make it easier for you to view your health information - all in one secure location - when and where you need it. We expect MyChart will enhance the quality of care and service we provide.  When you register for MyChart, you can:    View your test results.    Request appointments and receive appointment reminders via email.    Request medication renewals.    View your medical history, allergies, medications and immunizations.    Communicate with your physician's office through a password-protected site.    Conveniently print information such as your medication lists.  To find out if MyChart is right for you, please talk to a member of our clinical staff today. We will gladly answer your questions about this free health and wellness tool.  If you are age 52 or older and want a member of your family to have access to your record, you must provide written consent by completing a proxy form available at our office. Please speak to our clinical staff about guidelines regarding accounts for patients younger than age 52.  As you activate your MyChart account and need any technical assistance, please call the MyChart technical support line at [phone]) or email your question to [email or web address]. If you email your question(s), please include your name, a return phone number and the best time to reach you.  If you have non-urgent health-related questions, you can send a message to our office through MyChart at [email or web address]. If you have a medical emergency, call 911.  Thank you for using MyChart as your new health and wellness  resource!   MyChart licensed from [employer],  [id number]. Patents Pending.

## 2012-09-05 NOTE — Progress Notes (Signed)
This is a 52 year old Caucasian female with bipolar disorder followed by mental health.  She was hospitalized from June 21 to June 24 with acute alcohol-induced pancreatitis and severe abdominal pain after being in alcohol recovery for 13 years.  Serial CT scans that showed no evidence of pancreatic pseudocyst.  She currently is completely asymptomatic and denies any abdominal pain, nausea vomiting, or systemic symptoms.  She passes has solitary rectal ulcer syndrome which also has resolved, and she is having regular bowel movements.  Her acid reflux is managed with Protonix 40 mg a day.  She does have a chronic pain syndrome with arthritis in her back and knees and she uses Norco -acetaminophen 5 days 325 mg periodically, but allegedly is not abusing this medication.  She is on several psychotropic medications and a variety of multivitamins.  Also of note his recent new onset glucose intolerance, and she is on Glucotrol 5 mg a day and followed by Dr. Maryella Shivers.  The patient denies use of cigarettes, other drugs, has been off of alcohol now for several weeks.  She does occasionally attend daily meetings, but mostly as a local friendship support group to maintain her sobriety.  She specifically denies dysphagia, melena, hematochezia, any cardiopulmonary or systemic complaints.  She does not abuse NSAIDs.  Current Medications, Allergies, Past Medical History, Past Surgical History, Family History and Social History were reviewed in [employer] [facility] record.  ROS: All systems were reviewed and are negative unless otherwise stated in the HPI.      No Known Allergies Outpatient Prescriptions Prior to Visit  Medication Sig Dispense Refill  . ARIPiprazole (ABILIFY) 15 MG tablet Take 15 mg by mouth daily.        . cetirizine (ZYRTEC) 10 MG tablet Take 10 mg by mouth daily.      Marland Kitchen FLUoxetine (PROZAC) 20 MG tablet Take 20 mg by mouth 2 (two) times daily.        . fluticasone (FLONASE) 50  MCG/ACT nasal spray Place 2 sprays into the nose daily.      . folic acid (FOLVITE) 1 MG tablet Take 1 tablet (1 mg total) by mouth daily.  30 tablet  0  . HYDROcodone-acetaminophen (NORCO/VICODIN) 5-325 MG per tablet Take 1 tablet by mouth every 8 (eight) hours as needed for pain.       Marland Kitchen lamoTRIgine (LAMICTAL) 100 MG tablet Take 100 mg by mouth daily.       . pantoprazole (PROTONIX) 40 MG tablet Take 40 mg by mouth 2 (two) times daily.      . pravastatin (PRAVACHOL) 40 MG tablet Take 40 mg by mouth daily.      . promethazine (PHENERGAN) 12.5 MG suppository Place 1 suppository (12.5 mg total) rectally every 4 (four) hours as needed for nausea.  20 each  0  . thiamine 100 MG tablet Take 1 tablet (100 mg total) by mouth daily.  30 tablet  0  . oxycodone (OXY-IR) 5 MG capsule Take 1 capsule (5 mg total) by mouth every 4 (four) hours as needed.  30 capsule  0  . lisinopril (PRINIVIL,ZESTRIL) 20 MG tablet Take 1 tablet (20 mg total) by mouth daily.  30 tablet  0   No facility-administered medications prior to visit.   Past Medical History  Diagnosis Date  . Personal history of colonic polyps 10/16/2009    hyperplastic  . Diverticulosis of colon (without mention of hemorrhage)   . Esophageal reflux   . Ulcerative colitis, unspecified   .  Depressive disorder, not elsewhere classified   . Bipolar disorder   . Hiatal hernia   . Pre-diabetes   . Hypertension   . Anemia   . Hyperlipidemia   . Chronic kidney disease   . Anal fistula   . Obesity   . Iron deficiency    Past Surgical History  Procedure Laterality Date  . Finger amputation  2010    right index  . Knee arthroscopy      bilateral, left x2  . Hemorroidectomy    . Total abdominal hysterectomy     History   Social History  . Marital Status: Single    Spouse Name: N/A    Number of Children: 0  . Years of Education: 2   Occupational History  . produce clerk   .     Social History Main Topics  . Smoking status: Current  Every Day Smoker    Types: Cigarettes  . Smokeless tobacco: Never Used     Comment: pt states she smokes 1-2 cigarettes/ day  . Alcohol Use: No  . Drug Use: No  . Sexually Active: None   Other Topics Concern  . None   Social History Narrative  . None   Family History  Problem Relation Age of Onset  . Irritable bowel syndrome Mother   . Ovarian cancer Maternal Aunt   . Diabetes Father   . Heart attack Father   . Hypertension Father   . Hypertension Mother   . Thyroid disease Sister   . Thyroid disease Brother           Physical Exam: Healthy-appearing patient in no distress.  Blood pressure 106/74, pulse 66 and regular and weight 200 pounds with a BMI of 33.28.  97% oxygen saturation on room air.  I cannot appreciate stigmata of chronic liver disease.  Her chest is clear and she is in a regular rhythm without murmurs gallops or rubs.  Her abdomen is not distended and I cannot appreciate organomegaly, masses or tenderness.  Bowel sounds are normal.  Peripheral extremities are unremarkable and mental status is normal.    Assessment and Plan: Resolving acute pancreatitis from alcohol abuse and a middle-aged patient with chronic bipolar disorder and new onset glucose intolerance probably related to degree of alcohol-induced chronic pancreatitis.  Review of her CT scans do not show any pseudocyst that will require repeat ultrasound or CT scans at this time..  We have a long discussion today concerning her alcohol use and recurrent pancreatitis, and I have urged her to maintain her sobriety at all cost, and with whatever helps she can muster including AA.Peggye Form asked continue all of her medications including her multivitamins, and she is to continue her followup with primary care per her glucose intolerance.  I've urged her to continue and maintain her ongoing relationships with her mental health professionals.  I see no need for further GI evaluation at this time.  She should continue  her daily PPI medication for acid reflux.  After speaking with this patient, I think her chances of maintaining sobriety are excellent, and she previously was in recovery for 13 years.. I will continue to see her on a when necessary basis as needed.  She is currently having regular bowel movements and denies problem with her solitary rectal ulcer syndrome.  Please copy Dr. Lovell Sheehan and pertinent subspecialists physicians with a copy this note No diagnosis found.

## 2012-12-08 ENCOUNTER — Telehealth: Payer: Self-pay | Admitting: Gastroenterology

## 2012-12-08 NOTE — Telephone Encounter (Signed)
Pt reports a lot of bloating and cramping; she drinks milk with almost every meal. Informed pt she may be lactose intolerant. Suggested she try Lactaid pills or the milk. Pt stated understanding and will call if this is not the problem.

## 2012-12-17 ENCOUNTER — Telehealth: Payer: Self-pay | Admitting: Gastroenterology

## 2012-12-17 NOTE — Telephone Encounter (Signed)
On call note. Pt complains of frequent heartburn, regurg for 2 days. States she under more stress. Continue Protonix bid, Zantac bid prn, strictly follow antireflux measures, add Gaviscon q2h prn. Call office if symptoms do not improve in next 2-3 days.

## 2013-01-23 ENCOUNTER — Telehealth: Payer: Self-pay | Admitting: *Deleted

## 2013-01-23 NOTE — Telephone Encounter (Signed)
Refill request for Promethazine suppository, ok to refill?

## 2013-01-24 MED ORDER — PROMETHAZINE HCL 12.5 MG RE SUPP: 12.5000 mg | RECTAL | Status: DC | PRN

## 2013-01-24 NOTE — Telephone Encounter (Signed)
ok 

## 2013-02-27 DIAGNOSIS — I10 Essential (primary) hypertension: Secondary | ICD-10-CM | POA: Diagnosis not present

## 2013-02-27 DIAGNOSIS — K219 Gastro-esophageal reflux disease without esophagitis: Secondary | ICD-10-CM | POA: Diagnosis not present

## 2013-02-27 DIAGNOSIS — M545 Low back pain, unspecified: Secondary | ICD-10-CM | POA: Diagnosis not present

## 2013-02-27 DIAGNOSIS — R059 Cough, unspecified: Secondary | ICD-10-CM | POA: Diagnosis not present

## 2013-03-11 ENCOUNTER — Inpatient Hospital Stay (HOSPITAL_COMMUNITY)
Admission: EM | Admit: 2013-03-11 | Discharge: 2013-03-15 | DRG: 439 | Disposition: A | Discharge status: Home / Self Care | Payer: Medicare Other | Attending: Family Medicine | Admitting: Family Medicine

## 2013-03-11 ENCOUNTER — Emergency Department (HOSPITAL_COMMUNITY): Payer: Medicare Other

## 2013-03-11 ENCOUNTER — Telehealth: Payer: Self-pay | Admitting: Gastroenterology

## 2013-03-11 ENCOUNTER — Encounter (HOSPITAL_COMMUNITY): Payer: Self-pay | Admitting: Emergency Medicine

## 2013-03-11 DIAGNOSIS — Z833 Family history of diabetes mellitus: Secondary | ICD-10-CM

## 2013-03-11 DIAGNOSIS — Z79899 Other long term (current) drug therapy: Secondary | ICD-10-CM

## 2013-03-11 DIAGNOSIS — Z860100 Personal history of colon polyps, unspecified: Secondary | ICD-10-CM

## 2013-03-11 DIAGNOSIS — K604 Rectal fistula: Secondary | ICD-10-CM

## 2013-03-11 DIAGNOSIS — K2970 Gastritis, unspecified, without bleeding: Secondary | ICD-10-CM

## 2013-03-11 DIAGNOSIS — F1010 Alcohol abuse, uncomplicated: Secondary | ICD-10-CM

## 2013-03-11 DIAGNOSIS — Z8249 Family history of ischemic heart disease and other diseases of the circulatory system: Secondary | ICD-10-CM

## 2013-03-11 DIAGNOSIS — F17200 Nicotine dependence, unspecified, uncomplicated: Secondary | ICD-10-CM | POA: Diagnosis present

## 2013-03-11 DIAGNOSIS — F319 Bipolar disorder, unspecified: Secondary | ICD-10-CM

## 2013-03-11 DIAGNOSIS — K859 Acute pancreatitis without necrosis or infection, unspecified: Secondary | ICD-10-CM | POA: Diagnosis not present

## 2013-03-11 DIAGNOSIS — Z6841 Body Mass Index (BMI) 40.0 and over, adult: Secondary | ICD-10-CM

## 2013-03-11 DIAGNOSIS — K863 Pseudocyst of pancreas: Secondary | ICD-10-CM

## 2013-03-11 DIAGNOSIS — F411 Generalized anxiety disorder: Secondary | ICD-10-CM | POA: Diagnosis present

## 2013-03-11 DIAGNOSIS — R197 Diarrhea, unspecified: Secondary | ICD-10-CM

## 2013-03-11 DIAGNOSIS — F3289 Other specified depressive episodes: Secondary | ICD-10-CM

## 2013-03-11 DIAGNOSIS — R110 Nausea: Secondary | ICD-10-CM

## 2013-03-11 DIAGNOSIS — R6510 Systemic inflammatory response syndrome (SIRS) of non-infectious origin without acute organ dysfunction: Secondary | ICD-10-CM

## 2013-03-11 DIAGNOSIS — N189 Chronic kidney disease, unspecified: Secondary | ICD-10-CM | POA: Diagnosis present

## 2013-03-11 DIAGNOSIS — E669 Obesity, unspecified: Secondary | ICD-10-CM | POA: Diagnosis present

## 2013-03-11 DIAGNOSIS — K449 Diaphragmatic hernia without obstruction or gangrene: Secondary | ICD-10-CM

## 2013-03-11 DIAGNOSIS — K626 Ulcer of anus and rectum: Secondary | ICD-10-CM

## 2013-03-11 DIAGNOSIS — K5120 Ulcerative (chronic) proctitis without complications: Secondary | ICD-10-CM

## 2013-03-11 DIAGNOSIS — I129 Hypertensive chronic kidney disease with stage 1 through stage 4 chronic kidney disease, or unspecified chronic kidney disease: Secondary | ICD-10-CM | POA: Diagnosis present

## 2013-03-11 DIAGNOSIS — K7689 Other specified diseases of liver: Secondary | ICD-10-CM | POA: Diagnosis not present

## 2013-03-11 DIAGNOSIS — D649 Anemia, unspecified: Secondary | ICD-10-CM | POA: Insufficient documentation

## 2013-03-11 DIAGNOSIS — K219 Gastro-esophageal reflux disease without esophagitis: Secondary | ICD-10-CM | POA: Diagnosis present

## 2013-03-11 DIAGNOSIS — F329 Major depressive disorder, single episode, unspecified: Secondary | ICD-10-CM

## 2013-03-11 DIAGNOSIS — R1013 Epigastric pain: Secondary | ICD-10-CM

## 2013-03-11 DIAGNOSIS — K5730 Diverticulosis of large intestine without perforation or abscess without bleeding: Secondary | ICD-10-CM

## 2013-03-11 DIAGNOSIS — R05 Cough: Secondary | ICD-10-CM

## 2013-03-11 DIAGNOSIS — K602 Anal fissure, unspecified: Secondary | ICD-10-CM

## 2013-03-11 DIAGNOSIS — Z8719 Personal history of other diseases of the digestive system: Secondary | ICD-10-CM

## 2013-03-11 DIAGNOSIS — K649 Unspecified hemorrhoids: Secondary | ICD-10-CM | POA: Diagnosis present

## 2013-03-11 DIAGNOSIS — R7309 Other abnormal glucose: Secondary | ICD-10-CM | POA: Diagnosis present

## 2013-03-11 DIAGNOSIS — R112 Nausea with vomiting, unspecified: Secondary | ICD-10-CM

## 2013-03-11 DIAGNOSIS — E785 Hyperlipidemia, unspecified: Secondary | ICD-10-CM | POA: Insufficient documentation

## 2013-03-11 DIAGNOSIS — I10 Essential (primary) hypertension: Secondary | ICD-10-CM | POA: Insufficient documentation

## 2013-03-11 DIAGNOSIS — A09 Infectious gastroenteritis and colitis, unspecified: Secondary | ICD-10-CM

## 2013-03-11 DIAGNOSIS — K921 Melena: Secondary | ICD-10-CM

## 2013-03-11 DIAGNOSIS — R1084 Generalized abdominal pain: Secondary | ICD-10-CM

## 2013-03-11 DIAGNOSIS — K5190 Ulcerative colitis, unspecified, without complications: Secondary | ICD-10-CM | POA: Diagnosis present

## 2013-03-11 DIAGNOSIS — Z8041 Family history of malignant neoplasm of ovary: Secondary | ICD-10-CM

## 2013-03-11 DIAGNOSIS — R059 Cough, unspecified: Secondary | ICD-10-CM

## 2013-03-11 DIAGNOSIS — K6289 Other specified diseases of anus and rectum: Secondary | ICD-10-CM

## 2013-03-11 HISTORY — DX: Acute pancreatitis without necrosis or infection, unspecified: K85.90

## 2013-03-11 HISTORY — DX: Systemic inflammatory response syndrome (sirs) of non-infectious origin without acute organ dysfunction: R65.10

## 2013-03-11 LAB — URINALYSIS, ROUTINE W REFLEX MICROSCOPIC
Bilirubin Urine: NEGATIVE
Glucose, UA: 100 mg/dL — AB
Hgb urine dipstick: NEGATIVE
KETONES UR: NEGATIVE mg/dL
LEUKOCYTES UA: NEGATIVE
Nitrite: NEGATIVE
PROTEIN: 30 mg/dL — AB
Specific Gravity, Urine: 1.03 (ref 1.005–1.030)
UROBILINOGEN UA: 0.2 mg/dL (ref 0.0–1.0)
pH: 5.5 (ref 5.0–8.0)

## 2013-03-11 LAB — COMPREHENSIVE METABOLIC PANEL
ALT: 89 U/L — ABNORMAL HIGH (ref 0–35)
AST: 122 U/L — ABNORMAL HIGH (ref 0–37)
Albumin: 3.4 g/dL — ABNORMAL LOW (ref 3.5–5.2)
Alkaline Phosphatase: 92 U/L (ref 39–117)
BILIRUBIN TOTAL: 0.4 mg/dL (ref 0.3–1.2)
BUN: 15 mg/dL (ref 6–23)
CALCIUM: 8.5 mg/dL (ref 8.4–10.5)
CO2: 22 meq/L (ref 19–32)
CREATININE: 0.66 mg/dL (ref 0.50–1.10)
Chloride: 94 mEq/L — ABNORMAL LOW (ref 96–112)
GLUCOSE: 163 mg/dL — AB (ref 70–99)
Potassium: 4 mEq/L (ref 3.7–5.3)
Sodium: 134 mEq/L — ABNORMAL LOW (ref 137–147)
Total Protein: 7.3 g/dL (ref 6.0–8.3)

## 2013-03-11 LAB — LIPASE, BLOOD: LIPASE: 872 U/L — AB (ref 11–59)

## 2013-03-11 LAB — URINE MICROSCOPIC-ADD ON

## 2013-03-11 LAB — CBC WITH DIFFERENTIAL/PLATELET
BASOS ABS: 0 10*3/uL (ref 0.0–0.1)
Basophils Relative: 0 % (ref 0–1)
EOS PCT: 0 % (ref 0–5)
Eosinophils Absolute: 0 10*3/uL (ref 0.0–0.7)
HEMATOCRIT: 41.6 % (ref 36.0–46.0)
HEMOGLOBIN: 14.5 g/dL (ref 12.0–15.0)
LYMPHS ABS: 0.9 10*3/uL (ref 0.7–4.0)
LYMPHS PCT: 6 % — AB (ref 12–46)
MCH: 33.9 pg (ref 26.0–34.0)
MCHC: 34.9 g/dL (ref 30.0–36.0)
MCV: 97.2 fL (ref 78.0–100.0)
MONO ABS: 0.7 10*3/uL (ref 0.1–1.0)
MONOS PCT: 4 % (ref 3–12)
Neutro Abs: 14.4 10*3/uL — ABNORMAL HIGH (ref 1.7–7.7)
Neutrophils Relative %: 90 % — ABNORMAL HIGH (ref 43–77)
Platelets: 226 10*3/uL (ref 150–400)
RBC: 4.28 MIL/uL (ref 3.87–5.11)
RDW: 13.3 % (ref 11.5–15.5)
WBC: 16 10*3/uL — AB (ref 4.0–10.5)

## 2013-03-11 LAB — LACTIC ACID, PLASMA: LACTIC ACID, VENOUS: 2.5 mmol/L — AB (ref 0.5–2.2)

## 2013-03-11 MED ORDER — CHLORDIAZEPOXIDE HCL 5 MG PO CAPS
10.0000 mg | ORAL_CAPSULE | Freq: Three times a day (TID) | ORAL | Status: DC
  Administered 2013-03-12 – 2013-03-14 (×10): 10 mg via ORAL
  Filled 2013-03-11 (×11): qty 2

## 2013-03-11 MED ORDER — ADULT MULTIVITAMIN W/MINERALS CH
1.0000 | ORAL_TABLET | Freq: Every day | ORAL | Status: DC
  Administered 2013-03-12 – 2013-03-15 (×4): 1 via ORAL
  Filled 2013-03-11 (×4): qty 1

## 2013-03-11 MED ORDER — IOHEXOL 300 MG/ML  SOLN
100.0000 mL | Freq: Once | INTRAMUSCULAR | Status: AC | PRN
  Administered 2013-03-11: 100 mL via INTRAVENOUS

## 2013-03-11 MED ORDER — SODIUM CHLORIDE 0.9 % IV BOLUS (SEPSIS)
500.0000 mL | Freq: Once | INTRAVENOUS | Status: AC
  Administered 2013-03-11: 500 mL via INTRAVENOUS

## 2013-03-11 MED ORDER — ALUM & MAG HYDROXIDE-SIMETH 200-200-20 MG/5ML PO SUSP: 30.0000 mL | Freq: Four times a day (QID) | ORAL | Status: DC | PRN

## 2013-03-11 MED ORDER — ONDANSETRON HCL 4 MG PO TABS: 4.0000 mg | ORAL_TABLET | Freq: Four times a day (QID) | ORAL | Status: DC | PRN

## 2013-03-11 MED ORDER — LORAZEPAM 1 MG PO TABS
1.0000 mg | ORAL_TABLET | Freq: Four times a day (QID) | ORAL | Status: AC | PRN
  Administered 2013-03-13: 1 mg via ORAL
  Filled 2013-03-11: qty 1

## 2013-03-11 MED ORDER — SODIUM CHLORIDE 0.9 % IJ SOLN
3.0000 mL | Freq: Two times a day (BID) | INTRAMUSCULAR | Status: DC
  Administered 2013-03-12 – 2013-03-14 (×2): 3 mL via INTRAVENOUS

## 2013-03-11 MED ORDER — FOLIC ACID 1 MG PO TABS
1.0000 mg | ORAL_TABLET | Freq: Every day | ORAL | Status: DC
  Administered 2013-03-12 – 2013-03-15 (×4): 1 mg via ORAL
  Filled 2013-03-11 (×4): qty 1

## 2013-03-11 MED ORDER — LORAZEPAM 2 MG/ML IJ SOLN
1.0000 mg | Freq: Four times a day (QID) | INTRAMUSCULAR | Status: AC | PRN
  Administered 2013-03-12 – 2013-03-13 (×5): 1 mg via INTRAVENOUS
  Filled 2013-03-11 (×5): qty 1

## 2013-03-11 MED ORDER — HEPARIN SODIUM (PORCINE) 5000 UNIT/ML IJ SOLN
5000.0000 [IU] | Freq: Three times a day (TID) | INTRAMUSCULAR | Status: DC
  Administered 2013-03-12 – 2013-03-15 (×10): 5000 [IU] via SUBCUTANEOUS
  Filled 2013-03-11 (×13): qty 1

## 2013-03-11 MED ORDER — GUAIFENESIN-DM 100-10 MG/5ML PO SYRP: 5.0000 mL | ORAL_SOLUTION | ORAL | Status: DC | PRN

## 2013-03-11 MED ORDER — ONDANSETRON HCL 4 MG/2ML IJ SOLN
4.0000 mg | Freq: Once | INTRAMUSCULAR | Status: AC
  Administered 2013-03-11: 4 mg via INTRAVENOUS
  Filled 2013-03-11: qty 2

## 2013-03-11 MED ORDER — THIAMINE HCL 100 MG/ML IJ SOLN
100.0000 mg | Freq: Every day | INTRAMUSCULAR | Status: DC
  Administered 2013-03-12: 100 mg via INTRAVENOUS
  Filled 2013-03-11 (×5): qty 1

## 2013-03-11 MED ORDER — HYDROMORPHONE HCL PF 1 MG/ML IJ SOLN
1.0000 mg | Freq: Once | INTRAMUSCULAR | Status: AC
  Administered 2013-03-11: 1 mg via INTRAVENOUS
  Filled 2013-03-11: qty 1

## 2013-03-11 MED ORDER — MORPHINE SULFATE 2 MG/ML IJ SOLN
2.0000 mg | INTRAMUSCULAR | Status: DC | PRN
  Administered 2013-03-11 – 2013-03-13 (×9): 2 mg via INTRAVENOUS
  Filled 2013-03-11 (×9): qty 1

## 2013-03-11 MED ORDER — HYDRALAZINE HCL 20 MG/ML IJ SOLN
10.0000 mg | Freq: Four times a day (QID) | INTRAMUSCULAR | Status: DC | PRN
  Administered 2013-03-13: 10 mg via INTRAVENOUS
  Filled 2013-03-11: qty 0.5
  Filled 2013-03-11: qty 1

## 2013-03-11 MED ORDER — IOHEXOL 300 MG/ML  SOLN
50.0000 mL | Freq: Once | INTRAMUSCULAR | Status: AC | PRN
  Administered 2013-03-11: 50 mL via ORAL

## 2013-03-11 MED ORDER — FENTANYL CITRATE 0.05 MG/ML IJ SOLN
100.0000 ug | Freq: Once | INTRAMUSCULAR | Status: AC
  Administered 2013-03-11: 100 ug via INTRAVENOUS
  Filled 2013-03-11: qty 2

## 2013-03-11 MED ORDER — ONDANSETRON HCL 4 MG/2ML IJ SOLN
4.0000 mg | Freq: Four times a day (QID) | INTRAMUSCULAR | Status: DC | PRN
  Administered 2013-03-11 – 2013-03-13 (×6): 4 mg via INTRAVENOUS
  Filled 2013-03-11 (×7): qty 2

## 2013-03-11 MED ORDER — SODIUM CHLORIDE 0.9 % IV SOLN
INTRAVENOUS | Status: DC
  Administered 2013-03-11 – 2013-03-12 (×2): via INTRAVENOUS

## 2013-03-11 MED ORDER — POLYETHYLENE GLYCOL 3350 17 G PO PACK
17.0000 g | PACK | Freq: Every day | ORAL | Status: DC | PRN
  Administered 2013-03-14: 17 g via ORAL
  Filled 2013-03-11 (×3): qty 1

## 2013-03-11 MED ORDER — PANTOPRAZOLE SODIUM 40 MG PO TBEC
40.0000 mg | DELAYED_RELEASE_TABLET | Freq: Two times a day (BID) | ORAL | Status: DC
  Administered 2013-03-12 – 2013-03-15 (×7): 40 mg via ORAL
  Filled 2013-03-11 (×8): qty 1

## 2013-03-11 MED ORDER — HYDROCODONE-ACETAMINOPHEN 5-325 MG PO TABS
1.0000 | ORAL_TABLET | ORAL | Status: DC | PRN
  Administered 2013-03-13 – 2013-03-15 (×11): 2 via ORAL
  Administered 2013-03-15: 1 via ORAL
  Filled 2013-03-11 (×12): qty 2

## 2013-03-11 MED ORDER — VITAMIN B-1 100 MG PO TABS
100.0000 mg | ORAL_TABLET | Freq: Every day | ORAL | Status: DC
  Administered 2013-03-12 – 2013-03-15 (×4): 100 mg via ORAL
  Filled 2013-03-11 (×4): qty 1

## 2013-03-11 NOTE — ED Notes (Addendum)
Pt reports hx of pancreatitis, has been sober for 13 years, except for slip up in June and then pt has drank the last two days. Pt reports lower chest pain/ upper abdominal pain 9/10. Pt has been vomiting. Nausea present  Pt does not want medical information discussed in front of family or friends.

## 2013-03-11 NOTE — ED Notes (Signed)
Attempted IV access x3 but unsuccessful.

## 2013-03-11 NOTE — Telephone Encounter (Signed)
On call note at 1250. Pt developed epigastric pain last night and it persists today. It reminds her of her prior episode of pancreatitis. No N/V/fever/diarrhea/bleeding. Advised to go to ED for evaluation. She would like to avoid ED if possible so I advised her to stay on clear liquids and use ibuprofen and Norco as directed for pain control. If her pain persists I advised her to go to the ED for evaluation later today.

## 2013-03-11 NOTE — H&P (Addendum)
Patient Demographics  [name] [name], is a 53 y.o. female  MRN: [id number]   DOB - 1960/10/22  Admit Date - 03/11/2013  Outpatient Primary MD for the patient is Theressa Millard, MD  Gastroenterologist [facility]   With History of -  Past Medical History  Diagnosis Date  . Personal history of colonic polyps 10/16/2009    hyperplastic  . Diverticulosis of colon (without mention of hemorrhage)   . Esophageal reflux   . Ulcerative colitis, unspecified   . Depressive disorder, not elsewhere classified   . Bipolar disorder   . Hiatal hernia   . Pre-diabetes   . Hypertension   . Anemia   . Hyperlipidemia   . Chronic kidney disease   . Anal fistula   . Obesity   . Iron deficiency   . Pancreatitis       Past Surgical History  Procedure Laterality Date  . Finger amputation  2010    right index  . Knee arthroscopy      bilateral, left x2  . Hemorroidectomy    . Total abdominal hysterectomy      in for   Chief Complaint  Patient presents with  . Chest Pain  . Abdominal Pain     HPI  [name] [name]  is a 53 y.o. female, with history of alcoholic pancreatitis, ulcerative colitis, rectal fistula which has now healed, GERD, anxiety and depression who has been abusing alcohol on a regular basis and had some binge drinking for the last 2 days after which she developed epigastric abdominal pain yesterday evening, pain radiates to her back, worse with eating food, better with bowel rest and pain medications, associated with some mild nausea and vomiting. Came to the ED where workup is suggestive of systemic inflammatory response secondary to acute alcoholic pancreatitis and I was requested to liquid the patient.  Patient denies any fever chills, no chest pain or shortness of breath, no cough, no focal weakness,  no blood or mucus in stool. See his ulcerative colitis has been stable.  Review of Systems    In addition to the HPI above,   No Fever-chills, No Headache, No changes with Vision or hearing, No problems swallowing food or Liquids, No Chest pain, Cough or Shortness of Breath, Positive Abdominal pain along with Nausea and Vommitting, Bowel movements are regular, No Blood in stool or Urine, No dysuria, No new skin rashes or bruises, No new joints pains-aches,  No new weakness, tingling, numbness in any extremity, No recent weight gain or loss, No polyuria, polydypsia or polyphagia, No significant Mental Stressors.  A full 10 point Review of Systems was done, except as stated above, all other Review of Systems were negative.   Social History History  Substance Use Topics  . Smoking status: Current Every Day Smoker    Types: Cigarettes  . Smokeless tobacco: Never Used     Comment: pt states she smokes 1-2 cigarettes/ day  . Alcohol  Use: No      Family History Family History  Problem Relation Age of Onset  . Irritable bowel syndrome Mother   . Ovarian cancer Maternal Aunt   . Diabetes Father   . Heart attack Father   . Hypertension Father   . Hypertension Mother   . Thyroid disease Sister   . Thyroid disease Brother       Prior to Admission medications   Medication Sig Start Date End Date Taking? Authorizing Provider  ARIPiprazole (ABILIFY) 15 MG tablet Take 15 mg by mouth daily.     Yes Historical Provider, MD  cetirizine (ZYRTEC) 10 MG tablet Take 10 mg by mouth daily.   Yes Historical Provider, MD  FLUoxetine (PROZAC) 20 MG tablet Take 20 mg by mouth daily.    Yes Historical Provider, MD  fluticasone (FLONASE) 50 MCG/ACT nasal spray Place 2 sprays into the nose daily.   Yes Historical Provider, MD  HYDROcodone-acetaminophen (NORCO/VICODIN) 5-325 MG per tablet Take 1 tablet by mouth every 8 (eight) hours as needed for pain.    Yes Historical Provider, MD  lamoTRIgine  (LAMICTAL) 100 MG tablet Take 100 mg by mouth daily.    Yes Historical Provider, MD  lisinopril-hydrochlorothiazide (PRINZIDE,ZESTORETIC) 10-12.5 MG per tablet Take 1 tablet by mouth daily.   Yes Historical Provider, MD  pantoprazole (PROTONIX) 40 MG tablet Take 40 mg by mouth 2 (two) times daily.   Yes Historical Provider, MD  pravastatin (PRAVACHOL) 40 MG tablet Take 40 mg by mouth daily.   Yes Historical Provider, MD  promethazine (PHENERGAN) 12.5 MG suppository Place 1 suppository (12.5 mg total) rectally every 4 (four) hours as needed for nausea. 01/24/13  Yes Sable Feil, MD    No Known Allergies  Physical Exam  Vitals  Blood pressure 173/88, pulse 108, temperature 97.7 F (36.5 C), temperature source Oral, resp. rate 16, SpO2 100.00%.   1. General middle-aged white female lying in bed in NAD,     2. Normal affect and insight, Not Suicidal or Homicidal, Awake Alert, Oriented X 3.  3. No F.N deficits, ALL C.Nerves Intact, Strength 5/5 all 4 extremities, Sensation intact all 4 extremities, Plantars down going.  4. Ears and Eyes appear Normal, Conjunctivae clear, PERRLA. Moist Oral Mucosa.  5. Supple Neck, No JVD, No cervical lymphadenopathy appriciated, No Carotid Bruits.  6. Symmetrical Chest wall movement, Good air movement bilaterally, CTAB.  7. RRR, No Gallops, Rubs or Murmurs, No Parasternal Heave.  8. Positive Bowel Sounds, Abdomen Soft, positive epigastric tenderness, No organomegaly appriciated,No rebound -guarding or rigidity.  9.  No Cyanosis, Normal Skin Turgor, No Skin Rash or Bruise.  10. Good muscle tone,  joints appear normal , no effusions, Normal ROM, right middle finger amputation  11. No Palpable Lymph Nodes in Neck or Axillae     Data Review  CBC  Recent Labs Lab 03/11/13 1829  WBC 16.0*  HGB 14.5  HCT 41.6  PLT 226  MCV 97.2  MCH 33.9  MCHC 34.9  RDW 13.3  LYMPHSABS 0.9  MONOABS 0.7  EOSABS 0.0  BASOSABS 0.0    ------------------------------------------------------------------------------------------------------------------  Chemistries   Recent Labs Lab 03/11/13 1829  NA 134*  K 4.0  CL 94*  CO2 22  GLUCOSE 163*  BUN 15  CREATININE 0.66  CALCIUM 8.5  AST 122*  ALT 89*  ALKPHOS 92  BILITOT 0.4   ------------------------------------------------------------------------------------------------------------------ CrCl is unknown because both a height and weight (above a minimum accepted value) are required for  this calculation. ------------------------------------------------------------------------------------------------------------------ No results found for this basename: TSH, T4TOTAL, FREET3, T3FREE, THYROIDAB,  in the last 72 hours   Coagulation profile No results found for this basename: INR, PROTIME,  in the last 168 hours ------------------------------------------------------------------------------------------------------------------- No results found for this basename: DDIMER,  in the last 72 hours -------------------------------------------------------------------------------------------------------------------  Cardiac Enzymes No results found for this basename: CK, CKMB, TROPONINI, MYOGLOBIN,  in the last 168 hours ------------------------------------------------------------------------------------------------------------------ No components found with this basename: POCBNP,    ---------------------------------------------------------------------------------------------------------------  Urinalysis    Component Value Date/Time   COLORURINE AMBER* 08/12/2012 1407   APPEARANCEUR CLOUDY* 08/12/2012 1407   LABSPEC 1.024 08/12/2012 1407   PHURINE 5.0 08/12/2012 1407   GLUCOSEU NEGATIVE 08/12/2012 1407   HGBUR SMALL* 08/12/2012 1407   BILIRUBINUR SMALL* 08/12/2012 1407   KETONESUR NEGATIVE 08/12/2012 1407   PROTEINUR 100* 08/12/2012 1407   UROBILINOGEN 0.2 08/12/2012 1407    NITRITE NEGATIVE 08/12/2012 [place] 08/12/2012 1407    ----------------------------------------------------------------------------------------------------------------  Imaging results:   No results found.      Assessment & Plan   1. SIRs due to acute alcoholic pancreatitis. We'll be admitted to a telemetry bed, n.p.o., IV fluids, pain control, consider to quit alcohol, transient BN lipase peaked she follows with [facility]. Note a CT scan of the abdomen and pelvis has been ordered by the GI physician it is still pending.    2. History of ulcerative colitis with rectal fistula. Clinically stable in no acute issues the patient.    3. High blood pressure. Secondary to the pain, pain control, as needed IV hydralazine.    4. GERD. Continue PPI P.    5. Alcohol and tobacco abuse. Counseled to quit alcohol, placed on CIWA protocol. Scheduled Librium.    6. Anxiety and depression. Home medications to continue orally once improved from pancreatitis.    7. Hyperglycemia - likely due to #1, check A1c.      DVT Prophylaxis Heparin    AM Labs Ordered, also please review Full Orders  Family Communication: Admission, patients condition and plan of care including tests being ordered have been discussed with the patient   who indicates understanding and agree with the plan and Code Status.  Code Status full  Likely DC to  home  Condition GUARDED   Time spent in minutes : 35    Hero Kulish K M.D on 03/11/2013 at 8:03 PM  Between 7am to 7pm - Pager - [phone]  After 7pm go to [email or web address] - password [email or web address]  And look for the night coverage person covering me after hours  [facility] Office  [phone]

## 2013-03-11 NOTE — ED Provider Notes (Signed)
CSN: [id number]     Arrival date & time 03/11/13  1609 History   First MD Initiated Contact with Patient 03/11/13 1741     Chief Complaint  Patient presents with  . Chest Pain  . Abdominal Pain    HPI Pt reports hx of pancreatitis, has been sober for 13 years, except for slip up in June and then pt has drank the last two days. Pt reports lower chest pain/ upper abdominal pain 9/10. Pt has been vomiting. Nausea present  Past Medical History  Diagnosis Date  . Personal history of colonic polyps 10/16/2009    hyperplastic  . Diverticulosis of colon (without mention of hemorrhage)   . Esophageal reflux   . Ulcerative colitis, unspecified   . Depressive disorder, not elsewhere classified   . Bipolar disorder   . Hiatal hernia   . Pre-diabetes   . Hypertension   . Anemia   . Hyperlipidemia   . Chronic kidney disease   . Anal fistula   . Obesity   . Iron deficiency   . Pancreatitis    Past Surgical History  Procedure Laterality Date  . Finger amputation  2010    right index  . Knee arthroscopy      bilateral, left x2  . Hemorroidectomy    . Total abdominal hysterectomy     Family History  Problem Relation Age of Onset  . Irritable bowel syndrome Mother   . Ovarian cancer Maternal Aunt   . Diabetes Father   . Heart attack Father   . Hypertension Father   . Hypertension Mother   . Thyroid disease Sister   . Thyroid disease Brother    History  Substance Use Topics  . Smoking status: Current Every Day Smoker    Types: Cigarettes  . Smokeless tobacco: Never Used     Comment: pt states she smokes 1-2 cigarettes/ day  . Alcohol Use: No   OB History   Grav Para Term Preterm Abortions TAB SAB Ect Mult Living                 Review of Systems  Constitutional: Negative for diaphoresis.  Gastrointestinal: Negative for abdominal distention.  Genitourinary: Positive for flank pain. Negative for dysuria.    Allergies  Review of patient's allergies indicates no known  allergies.  Home Medications   No current outpatient prescriptions on file. BP 142/77  Pulse 81  Temp(Src) 98.7 F (37.1 C) (Oral)  Resp 18  Ht 5\' 5"  (1.651 m)  Wt 207 lb 14.3 oz (94.3 kg)  BMI 34.60 kg/m2  SpO2 97% Physical Exam  Nursing note and vitals reviewed. Constitutional: She is oriented to person, place, and time. She appears well-developed and well-nourished. No distress.  HENT:  Head: Normocephalic and atraumatic.  Eyes: Pupils are equal, round, and reactive to light.  Neck: Normal range of motion.  Cardiovascular: Normal rate and intact distal pulses.   Pulmonary/Chest: No respiratory distress.  Abdominal: Normal appearance. She exhibits no distension. There is tenderness.    Musculoskeletal: Normal range of motion.  Neurological: She is alert and oriented to person, place, and time. No cranial nerve deficit.  Skin: Skin is warm and dry. No rash noted.  Psychiatric: She has a normal mood and affect. Her behavior is normal.    ED Course  Procedures (including critical care time) Labs Review Labs Reviewed  CBC WITH DIFFERENTIAL - Abnormal; Notable for the following:    WBC 16.0 (*)    Neutrophils Relative %  90 (*)    Neutro Abs 14.4 (*)    Lymphocytes Relative 6 (*)    All other components within normal limits  COMPREHENSIVE METABOLIC PANEL - Abnormal; Notable for the following:    Sodium 134 (*)    Chloride 94 (*)    Glucose, Bld 163 (*)    Albumin 3.4 (*)    AST 122 (*)    ALT 89 (*)    All other components within normal limits  LIPASE, BLOOD - Abnormal; Notable for the following:    Lipase 872 (*)    All other components within normal limits  URINALYSIS, ROUTINE W REFLEX MICROSCOPIC - Abnormal; Notable for the following:    Glucose, UA 100 (*)    Protein, ur 30 (*)    All other components within normal limits  LACTIC ACID, PLASMA - Abnormal; Notable for the following:    Lactic Acid, Venous 2.5 (*)    All other components within normal limits    HEMOGLOBIN A1C - Abnormal; Notable for the following:    Hemoglobin A1C 5.7 (*)    Mean Plasma Glucose 117 (*)    All other components within normal limits  LIPASE, BLOOD - Abnormal; Notable for the following:    Lipase 479 (*)    All other components within normal limits  COMPREHENSIVE METABOLIC PANEL - Abnormal; Notable for the following:    Glucose, Bld 181 (*)    Calcium 8.2 (*)    Albumin 3.0 (*)    AST 92 (*)    ALT 84 (*)    All other components within normal limits  URINE MICROSCOPIC-ADD ON - Abnormal; Notable for the following:    Squamous Epithelial / LPF FEW (*)    Bacteria, UA FEW (*)    All other components within normal limits  CBC - Abnormal; Notable for the following:    WBC 11.4 (*)    All other components within normal limits  LIPASE, BLOOD - Abnormal; Notable for the following:    Lipase 148 (*)    All other components within normal limits  COMPREHENSIVE METABOLIC PANEL - Abnormal; Notable for the following:    Potassium 3.4 (*)    Glucose, Bld 132 (*)    Calcium 8.3 (*)    Albumin 2.5 (*)    AST 42 (*)    ALT 42 (*)    All other components within normal limits  CBC - Abnormal; Notable for the following:    RBC 3.69 (*)    HCT 35.5 (*)    All other components within normal limits  LIPASE, BLOOD - Abnormal; Notable for the following:    Lipase 83 (*)    All other components within normal limits  COMPREHENSIVE METABOLIC PANEL - Abnormal; Notable for the following:    Sodium 134 (*)    Potassium 3.5 (*)    Glucose, Bld 150 (*)    BUN 4 (*)    Albumin 2.4 (*)    AST 52 (*)    ALT 42 (*)    All other components within normal limits  LIPASE, BLOOD  COMPREHENSIVE METABOLIC PANEL    Anion gap = 18 Imaging Review No results found.  EKG Interpretation    Date/Time:  Sunday March 11 2013 16:27:27 EST Ventricular Rate:  118 PR Interval:  134 QRS Duration: 62 QT Interval:  328 QTC Calculation: 459 R Axis:   46 Text Interpretation:  Sinus  tachycardia Multiform ventricular premature complexes Consider right atrial enlargement ED  PHYSICIAN INTERPRETATION AVAILABLE IN [facility] Confirmed by TEST, RECORD ([id number]) on 03/13/2013 7:37:28 AM            MDM   1. Hypertension   2. Hyperlipidemia   3. Anemia   4. Acute pancreatitis   5. ETOH abuse   6. Ulcerative (chronic) proctitis   7. Abdominal pain, epigastric   8. Bipolar disorder, unspecified   9. Depressive disorder, not elsewhere classified   10. GERD (gastroesophageal reflux disease)        Dot Lanes, MD 03/14/13 2154

## 2013-03-12 DIAGNOSIS — I10 Essential (primary) hypertension: Secondary | ICD-10-CM | POA: Diagnosis not present

## 2013-03-12 DIAGNOSIS — R1013 Epigastric pain: Secondary | ICD-10-CM | POA: Diagnosis not present

## 2013-03-12 DIAGNOSIS — K859 Acute pancreatitis without necrosis or infection, unspecified: Secondary | ICD-10-CM | POA: Diagnosis not present

## 2013-03-12 LAB — COMPREHENSIVE METABOLIC PANEL
ALBUMIN: 3 g/dL — AB (ref 3.5–5.2)
ALK PHOS: 83 U/L (ref 39–117)
ALT: 84 U/L — ABNORMAL HIGH (ref 0–35)
AST: 92 U/L — ABNORMAL HIGH (ref 0–37)
BUN: 12 mg/dL (ref 6–23)
CO2: 19 mEq/L (ref 19–32)
CREATININE: 0.65 mg/dL (ref 0.50–1.10)
Calcium: 8.2 mg/dL — ABNORMAL LOW (ref 8.4–10.5)
Chloride: 100 mEq/L (ref 96–112)
GFR calc Af Amer: >90 mL/min (ref >90)
GFR calc non Af Amer: >90 mL/min (ref >90)
Glucose, Bld: 181 mg/dL — ABNORMAL HIGH (ref 70–99)
Potassium: 4.2 mEq/L (ref 3.7–5.3)
Sodium: 137 mEq/L (ref 137–147)
Total Bilirubin: 0.5 mg/dL (ref 0.3–1.2)
Total Protein: 6.9 g/dL (ref 6.0–8.3)

## 2013-03-12 LAB — HEMOGLOBIN A1C
HEMOGLOBIN A1C: 5.7 % — AB (ref <5.7)
Mean Plasma Glucose: 117 mg/dL — ABNORMAL HIGH (ref <117)

## 2013-03-12 LAB — CBC
HCT: 42.3 % (ref 36.0–46.0)
Hemoglobin: 14.3 g/dL (ref 12.0–15.0)
MCH: 33.1 pg (ref 26.0–34.0)
MCHC: 33.8 g/dL (ref 30.0–36.0)
MCV: 97.9 fL (ref 78.0–100.0)
Platelets: 196 10*3/uL (ref 150–400)
RBC: 4.32 MIL/uL (ref 3.87–5.11)
RDW: 14 % (ref 11.5–15.5)
WBC: 11.4 10*3/uL — ABNORMAL HIGH (ref 4.0–10.5)

## 2013-03-12 LAB — LIPASE, BLOOD: LIPASE: 479 U/L — AB (ref 11–59)

## 2013-03-12 MED ORDER — SODIUM CHLORIDE 0.9 % IV SOLN
INTRAVENOUS | Status: DC
  Administered 2013-03-13 (×2): via INTRAVENOUS

## 2013-03-12 MED ORDER — SODIUM CHLORIDE 0.9 % IV SOLN
500.0000 mg | Freq: Four times a day (QID) | INTRAVENOUS | Status: DC
  Administered 2013-03-12 – 2013-03-15 (×13): 500 mg via INTRAVENOUS
  Filled 2013-03-12 (×15): qty 500

## 2013-03-12 MED ORDER — SODIUM CHLORIDE 0.9 % IV BOLUS (SEPSIS)
250.0000 mL | Freq: Once | INTRAVENOUS | Status: AC
  Administered 2013-03-12: 250 mL via INTRAVENOUS

## 2013-03-12 NOTE — Progress Notes (Signed)
[facility] PROGRESS NOTE  [name] [name] SEG:[id number] DOB: 08-Sep-1960 DOA: 03/11/2013 PCP: Theressa Millard, MD  Assessment/Plan: 1. Acute alcoholic pancreatitis. SIRS:  -Start Primaxin to cover for infection.  -Continue with IV fluids, IV pain medications.  -Note a CT scan of the abdomen and pelvis has been ordered by the GI physician.  -CT scan show: Moderate pancreatitis.  Possible developing peripancreatic fluid collection. If symptoms  persist, consider short term CT follow-up. -WBC and lipase decreasing.   2. History of ulcerative colitis with rectal fistula. Clinically stable.  3. High blood pressure. as needed IV hydralazine.  4. GERD. Continue PPI P.  5. Alcohol and tobacco abuse. on CIWA protocol. Scheduled Librium. Need titration of librium.  6. Anxiety and depression. Home medications to continue orally once improved from pancreatitis.  7. Hyperglycemia - likely due to #1,  A1c 5.7.   Code Status: Full Code.  Family Communication: Care discussed with patient.  Disposition Plan: Remain inpatient.    Consultants:  none  Procedures:  None  Antibiotics:  Zosyn 1-19  HPI/Subjective: Notice some improvement on abdominal pain. Pain at time still 9/10/  No vomiting.    Objective: Filed Vitals:   03/12/13 0621  BP: 162/73  Pulse: 107  Temp: 98.8 F (37.1 C)  Resp: 24    Intake/Output Summary (Last 24 hours) at 03/12/13 0717 Last data filed at 03/12/13 0450  Gross per 24 hour  Intake      0 ml  Output    600 ml  Net   -600 ml   Filed Weights   03/12/13 0008  Weight: 88.996 kg (196 lb 3.2 oz)    Exam:   General:  No distress.   Cardiovascular: S 1, S 2 RRR  Respiratory: CTA  Abdomen: Bs present, soft, tenderness, no rigidity.   Musculoskeletal: no edema.   Data Reviewed: Basic Metabolic Panel:  Recent Labs Lab 03/11/13 1829 03/12/13 0545  NA 134* 137  K 4.0 4.2  CL 94* 100  CO2 22 19  GLUCOSE 163* 181*  BUN 15 12   CREATININE 0.66 0.65  CALCIUM 8.5 8.2*   Liver Function Tests:  Recent Labs Lab 03/11/13 1829 03/12/13 0545  AST 122* 92*  ALT 89* 84*  ALKPHOS 92 83  BILITOT 0.4 0.5  PROT 7.3 6.9  ALBUMIN 3.4* 3.0*    Recent Labs Lab 03/11/13 1829 03/12/13 0545  LIPASE 872* 479*   No results found for this basename: AMMONIA,  in the last 168 hours CBC:  Recent Labs Lab 03/11/13 1829 03/12/13 0545  WBC 16.0* 11.4*  NEUTROABS 14.4*  --   HGB 14.5 14.3  HCT 41.6 42.3  MCV 97.2 97.9  PLT 226 196   Cardiac Enzymes: No results found for this basename: CKTOTAL, CKMB, CKMBINDEX, TROPONINI,  in the last 168 hours BNP (last 3 results) No results found for this basename: PROBNP,  in the last 8760 hours CBG: No results found for this basename: GLUCAP,  in the last 168 hours  No results found for this or any previous visit (from the past 240 hour(s)).   Studies: Ct Abdomen Pelvis W Contrast  03/11/2013   CLINICAL DATA:  Abdominal pain. History of alcoholic pancreatitis and ulcerative colitis. Prior rectal fistula.  EXAM: CT ABDOMEN AND PELVIS WITH CONTRAST  TECHNIQUE: Multidetector CT imaging of the abdomen and pelvis was performed using the standard protocol following bolus administration of intravenous contrast.  CONTRAST:  35mL OMNIPAQUE IOHEXOL 300 MG/ML SOLN, 173mL OMNIPAQUE IOHEXOL  300 MG/ML SOLN  COMPARISON:  CT ABD/PELVIS W CM dated 08/12/2012  FINDINGS: Lower Chest: Clear lung bases. Normal heart size without pericardial or pleural effusion.  Abdomen/Pelvis: Moderate hepatic steatosis. Normal spleen. Stomach mildly displaced by the inflammatory process in the retroperitoneum.  Mild pancreatic atrophy. Moderate peripancreatic edema and ill-defined fluid. A possible developing peripancreatic fluid collection ventral to the pancreatic body at 3.8 x 2.3 cm on image 29. No pancreatic ductal dilatation. Normal gallbladder, without biliary ductal dilatation or common duct stone. Edema extends  into the root of the small bowel mesenteric.  Normal left adrenal gland. Right adrenal calcification is likely related to prior hemorrhage or infection. Normal regional vasculature.  Normal kidneys. No retroperitoneal or retrocrural adenopathy. Surgical changes within the anus. Normal terminal ileum and appendix. Otherwise normal small bowel. No pelvic adenopathy. Normal urinary bladder. Hysterectomy. No adnexal mass. No significant free fluid.  Bones/Musculoskeletal:  No acute osseous abnormality.  IMPRESSION: 1. Moderate pancreatitis. 2. Possible developing peripancreatic fluid collection. If symptoms persist, consider short term CT follow-up. 3. Hepatic steatosis. 4. Right adrenal calcification is likely related to remote trauma or infection.   Electronically Signed   By: Abigail Miyamoto M.D.   On: 03/11/2013 20:53    Scheduled Meds: . chlordiazePOXIDE  10 mg Oral TID  . folic acid  1 mg Oral Daily  . heparin  5,000 Units Subcutaneous Q8H  . multivitamin with minerals  1 tablet Oral Daily  . pantoprazole  40 mg Oral BID  . sodium chloride  3 mL Intravenous Q12H  . thiamine  100 mg Oral Daily   Or  . thiamine  100 mg Intravenous Daily   Continuous Infusions: . sodium chloride 125 mL/hr at 03/12/13 0450    Principal Problem:   Acute pancreatitis Active Problems:   TOBACCO ABUSE   HEMORRHOIDS   ULCERATIVE COLITIS   NAUSEA AND VOMITING   Obesity, Class III, BMI 40-49.9 (morbid obesity)   SIRS (systemic inflammatory response syndrome)    Time spent: 25 minutes.     [name]  [facility] Pager [phone]. If 7PM-7AM, please contact night-coverage at [email or web address], password [facility] 03/12/2013, 7:17 AM  LOS: 1 day

## 2013-03-12 NOTE — Progress Notes (Signed)
ANTIBIOTIC CONSULT NOTE - INITIAL  Pharmacy Consult for Primaxin Indication: pancreatitis  No Known Allergies  Patient Measurements: Height: 5\' 5"  (165.1 cm) Weight: 196 lb 3.2 oz (88.996 kg) IBW/kg (Calculated) : 57  Vital Signs: Temp: 98.8 F (37.1 C) (01/19 0621) Temp src: Oral (01/19 0621) BP: 162/73 mmHg (01/19 0621) Pulse Rate: 107 (01/19 0621)  Intake/Output from previous day: 01/18 0701 - 01/19 0700 In: -  Out: 600 [Urine:200; Emesis/NG output:400]  Labs:  Recent Labs  03/11/13 1829 03/12/13 0545  WBC 16.0* 11.4*  HGB 14.5 14.3  PLT 226 196  CREATININE 0.66 0.65   Estimated Creatinine Clearance: 90.6 ml/min (by C-G formula based on Cr of 0.65).  Microbiology: No results found for this or any previous visit (from the past 720 hour(s)).  Medical History: Past Medical History  Diagnosis Date  . Personal history of colonic polyps 10/16/2009    hyperplastic  . Diverticulosis of colon (without mention of hemorrhage)   . Esophageal reflux   . Ulcerative colitis, unspecified   . Depressive disorder, not elsewhere classified   . Bipolar disorder   . Hiatal hernia   . Pre-diabetes   . Hypertension   . Anemia   . Hyperlipidemia   . Chronic kidney disease   . Anal fistula   . Obesity   . Iron deficiency   . Pancreatitis     Medications:  Anti-infectives   None     Assessment: 42 yoF admitted 1/18 with SIRs due to acute alcoholic pancreatitis.  Pharmacy is consulted to dose Primaxin.  Tmax: afebrile  WBCs: improved to 11.4  Renal: SCr 0.65 with CrCl ~ 90 ml/min  Goal of Therapy:  Appropriate abx dosing, eradication of infection.   Plan:   Primaxin 500 mg IV q6h  Follow up renal fxn and culture results as available.   Gretta Arab PharmD, BCPS Pager [phone] 03/12/2013 7:31 AM

## 2013-03-13 DIAGNOSIS — I10 Essential (primary) hypertension: Secondary | ICD-10-CM | POA: Diagnosis not present

## 2013-03-13 LAB — CBC
HEMATOCRIT: 35.5 % — AB (ref 36.0–46.0)
HEMOGLOBIN: 12.3 g/dL (ref 12.0–15.0)
MCH: 33.3 pg (ref 26.0–34.0)
MCHC: 34.6 g/dL (ref 30.0–36.0)
MCV: 96.2 fL (ref 78.0–100.0)
Platelets: 157 10*3/uL (ref 150–400)
RBC: 3.69 MIL/uL — ABNORMAL LOW (ref 3.87–5.11)
RDW: 13.9 % (ref 11.5–15.5)
WBC: 7.5 10*3/uL (ref 4.0–10.5)

## 2013-03-13 LAB — COMPREHENSIVE METABOLIC PANEL
ALBUMIN: 2.5 g/dL — AB (ref 3.5–5.2)
ALK PHOS: 79 U/L (ref 39–117)
ALT: 42 U/L — AB (ref 0–35)
AST: 42 U/L — ABNORMAL HIGH (ref 0–37)
BUN: 8 mg/dL (ref 6–23)
CALCIUM: 8.3 mg/dL — AB (ref 8.4–10.5)
CO2: 25 mEq/L (ref 19–32)
Chloride: 102 mEq/L (ref 96–112)
Creatinine, Ser: 0.58 mg/dL (ref 0.50–1.10)
GFR calc non Af Amer: >90 mL/min (ref >90)
GLUCOSE: 132 mg/dL — AB (ref 70–99)
POTASSIUM: 3.4 meq/L — AB (ref 3.7–5.3)
SODIUM: 140 meq/L (ref 137–147)
TOTAL PROTEIN: 6.2 g/dL (ref 6.0–8.3)
Total Bilirubin: 0.5 mg/dL (ref 0.3–1.2)

## 2013-03-13 LAB — LIPASE, BLOOD: LIPASE: 148 U/L — AB (ref 11–59)

## 2013-03-13 MED ORDER — MORPHINE SULFATE 2 MG/ML IJ SOLN
1.0000 mg | INTRAMUSCULAR | Status: DC | PRN
  Administered 2013-03-13 – 2013-03-15 (×3): 1 mg via INTRAVENOUS
  Filled 2013-03-13 (×4): qty 1

## 2013-03-13 MED ORDER — SODIUM CHLORIDE 0.9 % IV SOLN: INTRAVENOUS | Status: DC

## 2013-03-13 MED ORDER — AMLODIPINE BESYLATE 10 MG PO TABS
10.0000 mg | ORAL_TABLET | Freq: Every day | ORAL | Status: DC
  Administered 2013-03-13 – 2013-03-15 (×3): 10 mg via ORAL
  Filled 2013-03-13 (×3): qty 1

## 2013-03-13 MED ORDER — POTASSIUM CHLORIDE CRYS ER 20 MEQ PO TBCR
40.0000 meq | EXTENDED_RELEASE_TABLET | Freq: Once | ORAL | Status: AC
  Administered 2013-03-13: 40 meq via ORAL
  Filled 2013-03-13: qty 2

## 2013-03-13 NOTE — Progress Notes (Addendum)
[facility] PROGRESS NOTE  [name] [name] ION:[id number] DOB: [birth year] DOA: 03/11/2013 PCP: Theressa Millard, MD  Assessment/Plan: 1. Acute alcoholic pancreatitis. SIRS:  -Pain much better, is NPO will advance diet -CT scan show: Moderate pancreatitis.  Possible developing peripancreatic fluid collection. If symptoms persist, consider short term CT follow-up. Is on ABX and clinically getting better.WBC and lipase decreasing.  - If tolerates diet could be discharged in the next day or so.    2. History of ulcerative colitis with rectal fistula. Clinically stable.     3. High blood pressure. Placed on scheduled Norvasc and as needed IV hydralazine.     4. GERD. Continue PPI .    5. Alcohol and tobacco abuse. on CIWA protocol. Scheduled Librium which will be titrated no signs of DTs, counseled to quit alcohol and smoking.    6. Anxiety and depression. Home medications to continue orally once improved from pancreatitis.     7. Hyperglycemia - likely due to #1,  A1c 5.7.    8. Low potassium. Replaced. Recheck in the morning    Code Status: Full Code.  Family Communication: Care discussed with patient.  Disposition Plan: Remain inpatient.    Consultants:  none  Procedures:  None  Antibiotics:  Zosyn 1-19  HPI/Subjective: Patient in bed appears to be in no distress, abdominal pain is much improved, no nausea vomiting, no chest pain  Objective: Filed Vitals:   03/13/13 0600  BP: 159/90  Pulse: 85  Temp: 98.1 F (36.7 C)  Resp: 16    Intake/Output Summary (Last 24 hours) at 03/13/13 1238 Last data filed at 03/13/13 0655  Gross per 24 hour  Intake 2508.33 ml  Output    850 ml  Net 1658.33 ml   Filed Weights   03/12/13 0008 03/13/13 0455  Weight: 88.996 kg (196 lb 3.2 oz) 90.13 kg (198 lb 11.2 oz)    Exam:   General:  No distress.   Cardiovascular: S 1, S 2 RRR  Respiratory: CTA  Abdomen: Bs present, soft, mild epigastric  tenderness, no rigidity.   Musculoskeletal: no edema.   Data Reviewed: Basic Metabolic Panel:  Recent Labs Lab 03/11/13 1829 03/12/13 0545 03/13/13 0510  NA 134* 137 140  K 4.0 4.2 3.4*  CL 94* 100 102  CO2 22 19 25   GLUCOSE 163* 181* 132*  BUN 15 12 8   CREATININE 0.66 0.65 0.58  CALCIUM 8.5 8.2* 8.3*   Liver Function Tests:  Recent Labs Lab 03/11/13 1829 03/12/13 0545 03/13/13 0510  AST 122* 92* 42*  ALT 89* 84* 42*  ALKPHOS 92 83 79  BILITOT 0.4 0.5 0.5  PROT 7.3 6.9 6.2  ALBUMIN 3.4* 3.0* 2.5*    Recent Labs Lab 03/11/13 1829 03/12/13 0545 03/13/13 0510  LIPASE 872* 479* 148*   No results found for this basename: AMMONIA,  in the last 168 hours CBC:  Recent Labs Lab 03/11/13 1829 03/12/13 0545 03/13/13 0510  WBC 16.0* 11.4* 7.5  NEUTROABS 14.4*  --   --   HGB 14.5 14.3 12.3  HCT 41.6 42.3 35.5*  MCV 97.2 97.9 96.2  PLT 226 196 157   Cardiac Enzymes: No results found for this basename: CKTOTAL, CKMB, CKMBINDEX, TROPONINI,  in the last 168 hours BNP (last 3 results) No results found for this basename: PROBNP,  in the last 8760 hours CBG: No results found for this basename: GLUCAP,  in the last 168 hours  No results found for this or any previous visit (  from the past 240 hour(s)).   Studies: Ct Abdomen Pelvis W Contrast  03/11/2013   CLINICAL DATA:  Abdominal pain. History of alcoholic pancreatitis and ulcerative colitis. Prior rectal fistula.  EXAM: CT ABDOMEN AND PELVIS WITH CONTRAST  TECHNIQUE: Multidetector CT imaging of the abdomen and pelvis was performed using the standard protocol following bolus administration of intravenous contrast.  CONTRAST:  67mL OMNIPAQUE IOHEXOL 300 MG/ML SOLN, 140mL OMNIPAQUE IOHEXOL 300 MG/ML SOLN  COMPARISON:  CT ABD/PELVIS W CM dated 08/12/2012  FINDINGS: Lower Chest: Clear lung bases. Normal heart size without pericardial or pleural effusion.  Abdomen/Pelvis: Moderate hepatic steatosis. Normal spleen. Stomach  mildly displaced by the inflammatory process in the retroperitoneum.  Mild pancreatic atrophy. Moderate peripancreatic edema and ill-defined fluid. A possible developing peripancreatic fluid collection ventral to the pancreatic body at 3.8 x 2.3 cm on image 29. No pancreatic ductal dilatation. Normal gallbladder, without biliary ductal dilatation or common duct stone. Edema extends into the root of the small bowel mesenteric.  Normal left adrenal gland. Right adrenal calcification is likely related to prior hemorrhage or infection. Normal regional vasculature.  Normal kidneys. No retroperitoneal or retrocrural adenopathy. Surgical changes within the anus. Normal terminal ileum and appendix. Otherwise normal small bowel. No pelvic adenopathy. Normal urinary bladder. Hysterectomy. No adnexal mass. No significant free fluid.  Bones/Musculoskeletal:  No acute osseous abnormality.  IMPRESSION: 1. Moderate pancreatitis. 2. Possible developing peripancreatic fluid collection. If symptoms persist, consider short term CT follow-up. 3. Hepatic steatosis. 4. Right adrenal calcification is likely related to remote trauma or infection.   Electronically Signed   By: Abigail Miyamoto M.D.   On: 03/11/2013 20:53    Scheduled Meds: . chlordiazePOXIDE  10 mg Oral TID  . folic acid  1 mg Oral Daily  . heparin  5,000 Units Subcutaneous Q8H  . imipenem-cilastatin  500 mg Intravenous Q6H  . multivitamin with minerals  1 tablet Oral Daily  . pantoprazole  40 mg Oral BID  . potassium chloride  40 mEq Oral Once  . sodium chloride  3 mL Intravenous Q12H  . thiamine  100 mg Oral Daily   Or  . thiamine  100 mg Intravenous Daily   Continuous Infusions: . sodium chloride 50 mL/hr at 03/13/13 1115    Principal Problem:   Acute pancreatitis Active Problems:   TOBACCO ABUSE   HEMORRHOIDS   ULCERATIVE COLITIS   NAUSEA AND VOMITING   Obesity, Class III, BMI 40-49.9 (morbid obesity)   SIRS (systemic inflammatory response  syndrome)    Time spent: 25 minutes.     [facility] Pager [phone]. If 7PM-7AM, please contact night-coverage at [email or web address], password [facility] 03/13/2013, 12:38 PM  LOS: 2 days

## 2013-03-14 DIAGNOSIS — F329 Major depressive disorder, single episode, unspecified: Secondary | ICD-10-CM

## 2013-03-14 DIAGNOSIS — F3289 Other specified depressive episodes: Secondary | ICD-10-CM

## 2013-03-14 DIAGNOSIS — I10 Essential (primary) hypertension: Secondary | ICD-10-CM | POA: Diagnosis not present

## 2013-03-14 DIAGNOSIS — F319 Bipolar disorder, unspecified: Secondary | ICD-10-CM | POA: Diagnosis not present

## 2013-03-14 DIAGNOSIS — K219 Gastro-esophageal reflux disease without esophagitis: Secondary | ICD-10-CM

## 2013-03-14 DIAGNOSIS — R1013 Epigastric pain: Secondary | ICD-10-CM | POA: Diagnosis not present

## 2013-03-14 DIAGNOSIS — K859 Acute pancreatitis without necrosis or infection, unspecified: Secondary | ICD-10-CM | POA: Diagnosis not present

## 2013-03-14 LAB — COMPREHENSIVE METABOLIC PANEL
ALK PHOS: 100 U/L (ref 39–117)
ALT: 42 U/L — AB (ref 0–35)
AST: 52 U/L — ABNORMAL HIGH (ref 0–37)
Albumin: 2.4 g/dL — ABNORMAL LOW (ref 3.5–5.2)
BUN: 4 mg/dL — ABNORMAL LOW (ref 6–23)
CALCIUM: 8.5 mg/dL (ref 8.4–10.5)
CO2: 25 meq/L (ref 19–32)
Chloride: 97 mEq/L (ref 96–112)
Creatinine, Ser: 0.53 mg/dL (ref 0.50–1.10)
GFR calc Af Amer: >90 mL/min (ref >90)
Glucose, Bld: 150 mg/dL — ABNORMAL HIGH (ref 70–99)
POTASSIUM: 3.5 meq/L — AB (ref 3.7–5.3)
SODIUM: 134 meq/L — AB (ref 137–147)
Total Bilirubin: 0.6 mg/dL (ref 0.3–1.2)
Total Protein: 6.4 g/dL (ref 6.0–8.3)

## 2013-03-14 LAB — LIPASE, BLOOD: Lipase: 83 U/L — ABNORMAL HIGH (ref 11–59)

## 2013-03-14 MED ORDER — ARIPIPRAZOLE 15 MG PO TABS
15.0000 mg | ORAL_TABLET | Freq: Every day | ORAL | Status: DC
  Administered 2013-03-14 – 2013-03-15 (×2): 15 mg via ORAL
  Filled 2013-03-14 (×3): qty 1

## 2013-03-14 MED ORDER — LAMOTRIGINE 100 MG PO TABS
100.0000 mg | ORAL_TABLET | Freq: Every day | ORAL | Status: DC
  Administered 2013-03-14 – 2013-03-15 (×2): 100 mg via ORAL
  Filled 2013-03-14 (×3): qty 1

## 2013-03-14 MED ORDER — FLUOXETINE HCL 20 MG PO TABS
20.0000 mg | ORAL_TABLET | Freq: Every day | ORAL | Status: DC
  Administered 2013-03-14 – 2013-03-15 (×2): 20 mg via ORAL
  Filled 2013-03-14 (×3): qty 1

## 2013-03-14 MED ORDER — CHLORDIAZEPOXIDE HCL 5 MG PO CAPS
10.0000 mg | ORAL_CAPSULE | Freq: Three times a day (TID) | ORAL | Status: DC
  Administered 2013-03-15: 10 mg via ORAL
  Filled 2013-03-14: qty 2

## 2013-03-14 NOTE — Progress Notes (Signed)
[facility] PROGRESS NOTE  [name] [name] IFO:[id number] DOB: [birth year] DOA: 03/11/2013 PCP: Theressa Millard, MD  Assessment/Plan: Principal Problem:   Acute pancreatitis --Diet as tolerated -Recommended alcohol cessation and patient has planned after discharge for group sessions and support - With continued improvement consider discharge 03/15/2013  Active Problems:   TOBACCO ABUSE - Recommended cessation    NAUSEA AND VOMITING - Resolved    SIRS (systemic inflammatory response syndrome) -  resolved  Code Status: full Family Communication: Discussed with patient Disposition Plan: With continued improvement discharged tomorrow   Consultants:  None  Procedures:  None  Antibiotics:  Primaxin  HPI/Subjective: Pt has no new complaints. No acute issues reported overnight.  Objective: Filed Vitals:   03/14/13 1339  BP: 125/70  Pulse: 84  Temp: 97.5 F (36.4 C)  Resp: 18    Intake/Output Summary (Last 24 hours) at 03/14/13 1850 Last data filed at 03/14/13 1846  Gross per 24 hour  Intake   2380 ml  Output   2350 ml  Net     30 ml   Filed Weights   03/12/13 0008 03/13/13 0455 03/14/13 0447  Weight: 88.996 kg (196 lb 3.2 oz) 90.13 kg (198 lb 11.2 oz) 94.3 kg (207 lb 14.3 oz)    Exam:   General:  Pt in NAD, Alert and awake  Cardiovascular: RRR, no MRG  Respiratory: CTA BL, no wheezes  Abdomen: soft, ND  Musculoskeletal: no cyanosis or clubbing   Data Reviewed: Basic Metabolic Panel:  Recent Labs Lab 03/11/13 1829 03/12/13 0545 03/13/13 0510 03/14/13 0503  NA 134* 137 140 134*  K 4.0 4.2 3.4* 3.5*  CL 94* 100 102 97  CO2 22 19 25 25   GLUCOSE 163* 181* 132* 150*  BUN 15 12 8  4*  CREATININE 0.66 0.65 0.58 0.53  CALCIUM 8.5 8.2* 8.3* 8.5   Liver Function Tests:  Recent Labs Lab 03/11/13 1829 03/12/13 0545 03/13/13 0510 03/14/13 0503  AST 122* 92* 42* 52*  ALT 89* 84* 42* 42*  ALKPHOS 92 83 79 100  BILITOT 0.4 0.5 0.5  0.6  PROT 7.3 6.9 6.2 6.4  ALBUMIN 3.4* 3.0* 2.5* 2.4*    Recent Labs Lab 03/11/13 1829 03/12/13 0545 03/13/13 0510 03/14/13 0503  LIPASE 872* 479* 148* 83*   No results found for this basename: AMMONIA,  in the last 168 hours CBC:  Recent Labs Lab 03/11/13 1829 03/12/13 0545 03/13/13 0510  WBC 16.0* 11.4* 7.5  NEUTROABS 14.4*  --   --   HGB 14.5 14.3 12.3  HCT 41.6 42.3 35.5*  MCV 97.2 97.9 96.2  PLT 226 196 157   Cardiac Enzymes: No results found for this basename: CKTOTAL, CKMB, CKMBINDEX, TROPONINI,  in the last 168 hours BNP (last 3 results) No results found for this basename: PROBNP,  in the last 8760 hours CBG: No results found for this basename: GLUCAP,  in the last 168 hours  No results found for this or any previous visit (from the past 240 hour(s)).   Studies: No results found.  Scheduled Meds: . amLODipine  10 mg Oral Daily  . ARIPiprazole  15 mg Oral Daily  . chlordiazePOXIDE  10 mg Oral TID  . FLUoxetine  20 mg Oral Daily  . folic acid  1 mg Oral Daily  . heparin  5,000 Units Subcutaneous Q8H  . imipenem-cilastatin  500 mg Intravenous Q6H  . lamoTRIgine  100 mg Oral Daily  . multivitamin with minerals  1 tablet Oral  Daily  . pantoprazole  40 mg Oral BID  . sodium chloride  3 mL Intravenous Q12H  . thiamine  100 mg Oral Daily   Or  . thiamine  100 mg Intravenous Daily   Continuous Infusions:    Time spent: > 35 minutes    Velvet Bathe  [facility] Pager [phone] If 7PM-7AM, please contact night-coverage at [email or web address], password [facility] 03/14/2013, 6:50 PM  LOS: 3 days

## 2013-03-15 DIAGNOSIS — R1084 Generalized abdominal pain: Secondary | ICD-10-CM

## 2013-03-15 DIAGNOSIS — K859 Acute pancreatitis without necrosis or infection, unspecified: Secondary | ICD-10-CM | POA: Diagnosis not present

## 2013-03-15 DIAGNOSIS — R1013 Epigastric pain: Secondary | ICD-10-CM | POA: Diagnosis not present

## 2013-03-15 DIAGNOSIS — I10 Essential (primary) hypertension: Secondary | ICD-10-CM | POA: Diagnosis not present

## 2013-03-15 DIAGNOSIS — R52 Pain, unspecified: Secondary | ICD-10-CM

## 2013-03-15 LAB — COMPREHENSIVE METABOLIC PANEL
ALT: 77 U/L — ABNORMAL HIGH (ref 0–35)
AST: 150 U/L — ABNORMAL HIGH (ref 0–37)
Albumin: 2.4 g/dL — ABNORMAL LOW (ref 3.5–5.2)
Alkaline Phosphatase: 115 U/L (ref 39–117)
BUN: 4 mg/dL — AB (ref 6–23)
CALCIUM: 8.4 mg/dL (ref 8.4–10.5)
CO2: 27 mEq/L (ref 19–32)
Chloride: 98 mEq/L (ref 96–112)
Creatinine, Ser: 0.57 mg/dL (ref 0.50–1.10)
GFR calc Af Amer: >90 mL/min (ref >90)
GFR calc non Af Amer: >90 mL/min (ref >90)
Glucose, Bld: 94 mg/dL (ref 70–99)
Potassium: 3.6 mEq/L — ABNORMAL LOW (ref 3.7–5.3)
Sodium: 135 mEq/L — ABNORMAL LOW (ref 137–147)
TOTAL PROTEIN: 6.3 g/dL (ref 6.0–8.3)
Total Bilirubin: 0.6 mg/dL (ref 0.3–1.2)

## 2013-03-15 LAB — LIPASE, BLOOD: LIPASE: 50 U/L (ref 11–59)

## 2013-03-15 MED ORDER — AMLODIPINE BESYLATE 10 MG PO TABS: 10.0000 mg | ORAL_TABLET | Freq: Every day | ORAL | Status: DC

## 2013-03-15 MED ORDER — ADULT MULTIVITAMIN W/MINERALS CH: 1.0000 | ORAL_TABLET | Freq: Every day | ORAL | Status: DC

## 2013-03-15 MED ORDER — ALUM & MAG HYDROXIDE-SIMETH 200-200-20 MG/5ML PO SUSP: 30.0000 mL | Freq: Four times a day (QID) | ORAL | Status: DC | PRN

## 2013-03-15 MED ORDER — TRAMADOL HCL 50 MG PO TABS: 50.0000 mg | ORAL_TABLET | Freq: Four times a day (QID) | ORAL | Status: DC | PRN

## 2013-03-15 NOTE — Progress Notes (Signed)
ANTIBIOTIC CONSULT NOTE - Follow up  Pharmacy Consult for Primaxin Indication: pancreatitis  No Known Allergies  Patient Measurements: Height: 5\' 5"  (165.1 cm) Weight: 201 lb 9.6 oz (91.445 kg) IBW/kg (Calculated) : 57  Vital Signs: Temp: 97.8 F (36.6 C) (01/22 0500) Temp src: Oral (01/22 0500) BP: 136/73 mmHg (01/22 0500) Pulse Rate: 78 (01/22 0500)  Intake/Output from previous day: 01/21 0701 - 01/22 0700 In: 1728 [P.O.:1440; I.V.:88; IV Piggyback:200] Out: 2600 [Urine:2600]  Labs:  Recent Labs  03/13/13 0510 03/14/13 0503 03/15/13 0502  WBC 7.5  --   --   HGB 12.3  --   --   PLT 157  --   --   CREATININE 0.58 0.53 0.57   Estimated Creatinine Clearance: 91.9 ml/min (by C-G formula based on Cr of 0.57).  Microbiology: No results found for this or any previous visit (from the past 720 hour(s)).   Medications:  Anti-infectives   Start     Dose/Rate Route Frequency Ordered Stop   03/12/13 0800  imipenem-cilastatin (PRIMAXIN) 500 mg in sodium chloride 0.9 % 100 mL IVPB     500 mg 200 mL/hr over 30 Minutes Intravenous Every 6 hours 03/12/13 0732       Assessment: 3 yoF admitted 1/18 with SIRs due to acute alcoholic pancreatitis.  Pharmacy is consulted to dose Primaxin.  Day # 4 Primaxin  Tmax: afebrile  WBCs: improved to WNL on 1/20  Renal: SCr 0.57 with CrCl ~ 92 ml/min, stable.  Goal of Therapy:  Appropriate abx dosing, eradication of infection.   Plan:   Continue Primaxin 500 mg IV q6h  Follow up renal fxn and culture results as available.  Follow up on duration of therapy.   Gretta Arab PharmD, BCPS Pager [phone] 03/15/2013 11:42 AM

## 2013-03-15 NOTE — Plan of Care (Signed)
Problem: Consults Goal: Diabetes Guidelines if Diabetic/Glucose > 140 If diabetic or lab glucose is > 140 mg/dl - Initiate Diabetes/Hyperglycemia Guidelines & Document Interventions  Outcome: Completed/Met Date Met:  03/15/13 Monitored by MD, HgA1C 5.7  Problem: Phase II Progression Outcomes Goal: Blood sugar < 150 Outcome: Not Progressing Being monitored by MD, HgA1c 5.7

## 2013-03-15 NOTE — Discharge Summary (Signed)
Physician Discharge Summary  DEBRAH GRANDERSON RSW:[id number] DOB: [birth year] DOA: 03/11/2013  PCP: Theressa Millard, MD  Admit date: 03/11/2013 Discharge date: 03/15/2013  Time spent: > 35 minutes  Recommendations for Outpatient Follow-up:  1. Followup of sodium levels, followup with potassium levels 2. Followup with blood pressure levels and adjust antihypertensive medications pending blood pressure values  Discharge Diagnoses:  Principal Problem:   Acute pancreatitis Active Problems:   TOBACCO ABUSE   HEMORRHOIDS   ULCERATIVE COLITIS   NAUSEA AND VOMITING   Obesity, Class III, BMI 40-49.9 (morbid obesity)   SIRS (systemic inflammatory response syndrome)   Discharge Condition: Stable  Diet recommendation: Low sodium, carb modified diet  Filed Weights   03/13/13 0455 03/14/13 0447 03/15/13 0502  Weight: 90.13 kg (198 lb 11.2 oz) 94.3 kg (207 lb 14.3 oz) 91.445 kg (201 lb 9.6 oz)    History of present illness:  53 year old Caucasian female with history of recent alcohol use who presented to the ED complaining of abdominal discomfort. Was found to have elevated lipase levels  Hospital Course:   Principal Problem:  Acute pancreatitis  --Recommend alcohol cessation. Also discuss low-fat diet - Will discharge with tramadol should patient have any abdominal discomfort moving forward.  Active Problems:  TOBACCO ABUSE  - Recommended cessation   NAUSEA AND VOMITING  - Resolved   SIRS (systemic inflammatory response syndrome)  - resolved  Procedures:  None  Consultations:  None  Discharge Exam: Filed Vitals:   03/15/13 0500  BP: 136/73  Pulse: 78  Temp: 97.8 F (36.6 C)  Resp: 16    General: Pt in NAD, Alert and Awake  Cardiovascular: rRR, no mrg Respiratory: CTA BL, no wheezes  Discharge Instructions  Discharge Orders   Future Orders Complete By Expires   Call MD for:  persistant nausea and vomiting  As directed    Call MD for:  redness, tenderness,  or signs of infection (pain, swelling, redness, odor or green/yellow discharge around incision site)  As directed    Call MD for:  severe uncontrolled pain  As directed    Call MD for:  temperature >100.4  As directed    Diet - low sodium heart healthy  As directed    Discharge instructions  As directed    Comments:     Please followup with your primary care physician in 1-2 weeks or sooner should any new concerns arise.   Increase activity slowly  As directed        Medication List    STOP taking these medications       HYDROcodone-acetaminophen 5-325 MG per tablet  Commonly known as:  NORCO/VICODIN      TAKE these medications       alum & mag hydroxide-simeth 200-200-20 MG/5ML suspension  Commonly known as:  MAALOX/MYLANTA  Take 30 mLs by mouth every 6 (six) hours as needed for indigestion or heartburn (dyspepsia).     amLODipine 10 MG tablet  Commonly known as:  NORVASC  Take 1 tablet (10 mg total) by mouth daily.     ARIPiprazole 15 MG tablet  Commonly known as:  ABILIFY  Take 15 mg by mouth daily.     cetirizine 10 MG tablet  Commonly known as:  ZYRTEC  Take 10 mg by mouth daily.     FLUoxetine 20 MG tablet  Commonly known as:  PROZAC  Take 20 mg by mouth daily.     fluticasone 50 MCG/ACT nasal spray  Commonly known as:  FLONASE  Place 2 sprays into the nose daily.     lamoTRIgine 100 MG tablet  Commonly known as:  LAMICTAL  Take 100 mg by mouth daily.     lisinopril-hydrochlorothiazide 10-12.5 MG per tablet  Commonly known as:  PRINZIDE,ZESTORETIC  Take 1 tablet by mouth daily.     multivitamin with minerals Tabs tablet  Take 1 tablet by mouth daily.     pantoprazole 40 MG tablet  Commonly known as:  PROTONIX  Take 40 mg by mouth 2 (two) times daily.     pravastatin 40 MG tablet  Commonly known as:  PRAVACHOL  Take 40 mg by mouth daily.     promethazine 12.5 MG suppository  Commonly known as:  PHENERGAN  Place 1 suppository (12.5 mg total)  rectally every 4 (four) hours as needed for nausea.     traMADol 50 MG tablet  Commonly known as:  ULTRAM  Take 1 tablet (50 mg total) by mouth every 6 (six) hours as needed for moderate pain.       No Known Allergies    The results of significant diagnostics from this hospitalization (including imaging, microbiology, ancillary and laboratory) are listed below for reference.    Significant Diagnostic Studies: Ct Abdomen Pelvis W Contrast  03/11/2013   CLINICAL DATA:  Abdominal pain. History of alcoholic pancreatitis and ulcerative colitis. Prior rectal fistula.  EXAM: CT ABDOMEN AND PELVIS WITH CONTRAST  TECHNIQUE: Multidetector CT imaging of the abdomen and pelvis was performed using the standard protocol following bolus administration of intravenous contrast.  CONTRAST:  58mL OMNIPAQUE IOHEXOL 300 MG/ML SOLN, 137mL OMNIPAQUE IOHEXOL 300 MG/ML SOLN  COMPARISON:  CT ABD/PELVIS W CM dated 08/12/2012  FINDINGS: Lower Chest: Clear lung bases. Normal heart size without pericardial or pleural effusion.  Abdomen/Pelvis: Moderate hepatic steatosis. Normal spleen. Stomach mildly displaced by the inflammatory process in the retroperitoneum.  Mild pancreatic atrophy. Moderate peripancreatic edema and ill-defined fluid. A possible developing peripancreatic fluid collection ventral to the pancreatic body at 3.8 x 2.3 cm on image 29. No pancreatic ductal dilatation. Normal gallbladder, without biliary ductal dilatation or common duct stone. Edema extends into the root of the small bowel mesenteric.  Normal left adrenal gland. Right adrenal calcification is likely related to prior hemorrhage or infection. Normal regional vasculature.  Normal kidneys. No retroperitoneal or retrocrural adenopathy. Surgical changes within the anus. Normal terminal ileum and appendix. Otherwise normal small bowel. No pelvic adenopathy. Normal urinary bladder. Hysterectomy. No adnexal mass. No significant free fluid.   Bones/Musculoskeletal:  No acute osseous abnormality.  IMPRESSION: 1. Moderate pancreatitis. 2. Possible developing peripancreatic fluid collection. If symptoms persist, consider short term CT follow-up. 3. Hepatic steatosis. 4. Right adrenal calcification is likely related to remote trauma or infection.   Electronically Signed   By: Abigail Miyamoto M.D.   On: 03/11/2013 20:53    Microbiology: No results found for this or any previous visit (from the past 240 hour(s)).   Labs: Basic Metabolic Panel:  Recent Labs Lab 03/11/13 1829 03/12/13 0545 03/13/13 0510 03/14/13 0503 03/15/13 0502  NA 134* 137 140 134* 135*  K 4.0 4.2 3.4* 3.5* 3.6*  CL 94* 100 102 97 98  CO2 22 19 25 25 27   GLUCOSE 163* 181* 132* 150* 94  BUN 15 12 8  4* 4*  CREATININE 0.66 0.65 0.58 0.53 0.57  CALCIUM 8.5 8.2* 8.3* 8.5 8.4   Liver Function Tests:  Recent Labs Lab 03/11/13 1829 03/12/13 0545 03/13/13 0510 03/14/13 0503  03/15/13 0502  AST 122* 92* 42* 52* 150*  ALT 89* 84* 42* 42* 77*  ALKPHOS 92 83 79 100 115  BILITOT 0.4 0.5 0.5 0.6 0.6  PROT 7.3 6.9 6.2 6.4 6.3  ALBUMIN 3.4* 3.0* 2.5* 2.4* 2.4*    Recent Labs Lab 03/11/13 1829 03/12/13 0545 03/13/13 0510 03/14/13 0503 03/15/13 0502  LIPASE 872* 479* 148* 83* 50   No results found for this basename: AMMONIA,  in the last 168 hours CBC:  Recent Labs Lab 03/11/13 1829 03/12/13 0545 03/13/13 0510  WBC 16.0* 11.4* 7.5  NEUTROABS 14.4*  --   --   HGB 14.5 14.3 12.3  HCT 41.6 42.3 35.5*  MCV 97.2 97.9 96.2  PLT 226 196 157   Cardiac Enzymes: No results found for this basename: CKTOTAL, CKMB, CKMBINDEX, TROPONINI,  in the last 168 hours BNP: BNP (last 3 results) No results found for this basename: PROBNP,  in the last 8760 hours CBG: No results found for this basename: GLUCAP,  in the last 168 hours     Signed:  Velvet Bathe  [facility] 03/15/2013, 1:03 PM

## 2013-06-01 DIAGNOSIS — F3130 Bipolar disorder, current episode depressed, mild or moderate severity, unspecified: Secondary | ICD-10-CM | POA: Diagnosis not present

## 2013-06-05 ENCOUNTER — Institutional Professional Consult (permissible substitution): Payer: Medicare Other | Admitting: Internal Medicine

## 2013-06-13 ENCOUNTER — Institutional Professional Consult (permissible substitution): Payer: Medicare Other | Admitting: Internal Medicine

## 2013-07-03 DIAGNOSIS — I2510 Atherosclerotic heart disease of native coronary artery without angina pectoris: Secondary | ICD-10-CM | POA: Diagnosis not present

## 2013-07-03 DIAGNOSIS — I10 Essential (primary) hypertension: Secondary | ICD-10-CM | POA: Diagnosis not present

## 2013-07-03 DIAGNOSIS — K219 Gastro-esophageal reflux disease without esophagitis: Secondary | ICD-10-CM | POA: Diagnosis not present

## 2013-07-18 ENCOUNTER — Other Ambulatory Visit: Payer: Self-pay | Admitting: Physical Medicine and Rehabilitation

## 2013-07-18 DIAGNOSIS — I10 Essential (primary) hypertension: Secondary | ICD-10-CM | POA: Diagnosis not present

## 2013-07-18 DIAGNOSIS — E7800 Pure hypercholesterolemia, unspecified: Secondary | ICD-10-CM | POA: Diagnosis not present

## 2013-07-18 DIAGNOSIS — M545 Low back pain, unspecified: Secondary | ICD-10-CM

## 2013-07-18 DIAGNOSIS — E119 Type 2 diabetes mellitus without complications: Secondary | ICD-10-CM | POA: Diagnosis not present

## 2013-07-18 DIAGNOSIS — M47817 Spondylosis without myelopathy or radiculopathy, lumbosacral region: Secondary | ICD-10-CM

## 2013-07-20 ENCOUNTER — Other Ambulatory Visit: Payer: Medicare Other

## 2013-07-20 ENCOUNTER — Ambulatory Visit
Admission: RE | Admit: 2013-07-20 | Discharge: 2013-07-20 | Disposition: A | Discharge status: Home / Self Care | Payer: Medicare Other | Source: Ambulatory Visit | Attending: Physical Medicine and Rehabilitation | Admitting: Physical Medicine and Rehabilitation

## 2013-07-20 DIAGNOSIS — M47817 Spondylosis without myelopathy or radiculopathy, lumbosacral region: Secondary | ICD-10-CM

## 2013-07-20 DIAGNOSIS — M48061 Spinal stenosis, lumbar region without neurogenic claudication: Secondary | ICD-10-CM | POA: Diagnosis not present

## 2013-07-20 DIAGNOSIS — M5126 Other intervertebral disc displacement, lumbar region: Secondary | ICD-10-CM | POA: Diagnosis not present

## 2013-07-20 DIAGNOSIS — M545 Low back pain, unspecified: Secondary | ICD-10-CM

## 2013-07-27 ENCOUNTER — Other Ambulatory Visit: Payer: Self-pay | Admitting: Gastroenterology

## 2013-07-28 ENCOUNTER — Other Ambulatory Visit: Payer: Self-pay | Admitting: Internal Medicine

## 2013-07-28 ENCOUNTER — Telehealth: Payer: Self-pay | Admitting: Internal Medicine

## 2013-07-28 MED ORDER — PROMETHAZINE HCL 12.5 MG RE SUPP: 12.5000 mg | Freq: Four times a day (QID) | RECTAL | Status: DC | PRN

## 2013-07-30 DIAGNOSIS — IMO0002 Reserved for concepts with insufficient information to code with codable children: Secondary | ICD-10-CM | POA: Diagnosis not present

## 2013-07-30 DIAGNOSIS — G894 Chronic pain syndrome: Secondary | ICD-10-CM | POA: Diagnosis not present

## 2013-07-30 DIAGNOSIS — M545 Low back pain, unspecified: Secondary | ICD-10-CM | POA: Diagnosis not present

## 2013-07-30 DIAGNOSIS — M47817 Spondylosis without myelopathy or radiculopathy, lumbosacral region: Secondary | ICD-10-CM | POA: Diagnosis not present

## 2013-08-01 DIAGNOSIS — Z1231 Encounter for screening mammogram for malignant neoplasm of breast: Secondary | ICD-10-CM | POA: Diagnosis not present

## 2013-08-01 NOTE — Telephone Encounter (Signed)
error 

## 2013-08-09 DIAGNOSIS — F3110 Bipolar disorder, current episode manic without psychotic features, unspecified: Secondary | ICD-10-CM | POA: Diagnosis not present

## 2013-08-09 DIAGNOSIS — F3130 Bipolar disorder, current episode depressed, mild or moderate severity, unspecified: Secondary | ICD-10-CM | POA: Diagnosis not present

## 2013-08-15 ENCOUNTER — Ambulatory Visit (HOSPITAL_BASED_OUTPATIENT_CLINIC_OR_DEPARTMENT_OTHER): Payer: Medicare Other | Attending: Family Medicine | Admitting: Radiology

## 2013-08-15 NOTE — Sleep Study (Unsigned)
08/15/2013 ( 2109 )  Patient [facility] at 2030 to the [facility] for a split night study. Tech was in room explaining paper work and entering information into computer when patient began feeling sick on her stomach.  Patient became nauseated and started vomiting. Tech ask patient if she need to go to the emergency room but patient declined. Therefore, patient made the decision that she would go home and call back to reschedule at a later date when she felt better. Carolin Coy , RPSGT,RRT

## 2013-08-28 DIAGNOSIS — IMO0002 Reserved for concepts with insufficient information to code with codable children: Secondary | ICD-10-CM | POA: Diagnosis not present

## 2013-08-28 DIAGNOSIS — M5137 Other intervertebral disc degeneration, lumbosacral region: Secondary | ICD-10-CM | POA: Diagnosis not present

## 2013-08-28 DIAGNOSIS — M545 Low back pain, unspecified: Secondary | ICD-10-CM | POA: Diagnosis not present

## 2013-08-28 DIAGNOSIS — G894 Chronic pain syndrome: Secondary | ICD-10-CM | POA: Diagnosis not present

## 2013-09-03 ENCOUNTER — Encounter (HOSPITAL_BASED_OUTPATIENT_CLINIC_OR_DEPARTMENT_OTHER): Payer: Medicare Other

## 2013-10-01 DIAGNOSIS — M545 Low back pain, unspecified: Secondary | ICD-10-CM | POA: Diagnosis not present

## 2013-10-01 DIAGNOSIS — IMO0002 Reserved for concepts with insufficient information to code with codable children: Secondary | ICD-10-CM | POA: Diagnosis not present

## 2013-10-01 DIAGNOSIS — M47817 Spondylosis without myelopathy or radiculopathy, lumbosacral region: Secondary | ICD-10-CM | POA: Diagnosis not present

## 2013-10-01 DIAGNOSIS — G894 Chronic pain syndrome: Secondary | ICD-10-CM | POA: Diagnosis not present

## 2013-11-23 ENCOUNTER — Telehealth: Payer: Self-pay | Admitting: Gastroenterology

## 2013-11-23 NOTE — Telephone Encounter (Signed)
I have left message for the patient to call back 

## 2013-11-23 NOTE — Telephone Encounter (Signed)
Patient is taking Omeprazole OTC BID by order of her PCP. She states she takes it during the day when she remembers. Discussed with her taking it 30 minutes prior to the first meal of the day and again in the evening. Discussed ways to remember by setting her phone alarm, setting it by the sink in the bathroom, ect.

## 2013-11-28 ENCOUNTER — Encounter: Payer: Self-pay | Admitting: Gastroenterology

## 2013-12-04 DIAGNOSIS — F319 Bipolar disorder, unspecified: Secondary | ICD-10-CM | POA: Diagnosis not present

## 2013-12-10 ENCOUNTER — Encounter: Payer: Self-pay | Admitting: *Deleted

## 2013-12-12 ENCOUNTER — Ambulatory Visit: Payer: Medicare Other | Admitting: Gastroenterology

## 2014-01-24 DIAGNOSIS — F319 Bipolar disorder, unspecified: Secondary | ICD-10-CM | POA: Diagnosis not present

## 2014-01-29 DIAGNOSIS — M5417 Radiculopathy, lumbosacral region: Secondary | ICD-10-CM | POA: Diagnosis not present

## 2014-01-29 DIAGNOSIS — M5137 Other intervertebral disc degeneration, lumbosacral region: Secondary | ICD-10-CM | POA: Diagnosis not present

## 2014-01-29 DIAGNOSIS — M545 Low back pain: Secondary | ICD-10-CM | POA: Diagnosis not present

## 2014-01-29 DIAGNOSIS — M5136 Other intervertebral disc degeneration, lumbar region: Secondary | ICD-10-CM | POA: Diagnosis not present

## 2014-01-29 DIAGNOSIS — G894 Chronic pain syndrome: Secondary | ICD-10-CM | POA: Diagnosis not present

## 2014-01-29 DIAGNOSIS — M47817 Spondylosis without myelopathy or radiculopathy, lumbosacral region: Secondary | ICD-10-CM | POA: Diagnosis not present

## 2014-02-26 ENCOUNTER — Ambulatory Visit (HOSPITAL_BASED_OUTPATIENT_CLINIC_OR_DEPARTMENT_OTHER): Payer: Medicare Other | Attending: Family Medicine

## 2014-03-04 ENCOUNTER — Ambulatory Visit (HOSPITAL_BASED_OUTPATIENT_CLINIC_OR_DEPARTMENT_OTHER): Payer: Medicare Other | Attending: Family Medicine | Admitting: Radiology

## 2014-03-04 VITALS — Ht 65.0 in | Wt 178.0 lb

## 2014-03-04 DIAGNOSIS — G4733 Obstructive sleep apnea (adult) (pediatric): Secondary | ICD-10-CM

## 2014-03-09 DIAGNOSIS — G4733 Obstructive sleep apnea (adult) (pediatric): Secondary | ICD-10-CM | POA: Diagnosis not present

## 2014-03-09 NOTE — Sleep Study (Signed)
   NAME: [name] [name] DATE OF BIRTH:  1960-08-22 MEDICAL RECORD NUMBER [id number]  LOCATION: [facility]  PHYSICIAN: Zenya Hickam D  DATE OF STUDY: 03/04/2014  SLEEP STUDY TYPE: Nocturnal Polysomnogram               REFERRING PHYSICIAN: Lucianne Lei, MD  INDICATION FOR STUDY: Hypersomnia with sleep apnea  EPWORTH SLEEPINESS SCORE:   4/24 HEIGHT: 5\' 5"  (165.1 cm)  WEIGHT: 178 lb (80.74 kg)    Body mass index is 29.62 kg/(m^2).  NECK SIZE: 15 in.  MEDICATIONS: Charted for review  SLEEP ARCHITECTURE: Total sleep time 206.5 minutes with sleep efficiency 57%. Stage I was 20.1%, stage II 79.9%, stages 3 and REM were absent. Sleep latency 35.5 minutes, awake after sleep onset 121 minutes, arousal index 36, bedtime medication: Prozac, Lamictal  RESPIRATORY DATA: Apnea hypopnea index (AHI) 0.3 per hour. A single hypopnea was recorded nonsupine. CPAP titration was not done.  OXYGEN DATA: Occasional moderate snoring with oxygen desaturation to a nadir of 90% and mean saturation 93.3% on room air  CARDIAC DATA: Sinus rhythm with PACs and PVCs  MOVEMENT/PARASOMNIA: A total of 44 limb jerks were scored of which 6 were associated with arousal or awakening for periodic limb movement with arousal index of 1.7 per hour, bathroom 2  IMPRESSION/ RECOMMENDATION:   1) Difficulty initiating and maintaining sleep with fragmentation and intervals of wakefulness throughout the night. Bedtime medication included Prozac and Lamictal. Management as insomnia may be appropriate. 2) Respiratory pattern was within normal limits. AHI 0.3 per hour (the normal range for adults is an AHI from 0-5 events per hour). Occasional moderate snoring with oxygen desaturation to a nadir of 90% and mean saturation 93.3% on room air   [place], American Board of Sleep Medicine  ELECTRONICALLY SIGNED ON:  03/09/2014, 9:51 AM [place] PH: [phone]   FX:  [phone] [place]

## 2014-04-22 DIAGNOSIS — F319 Bipolar disorder, unspecified: Secondary | ICD-10-CM | POA: Diagnosis not present

## 2014-05-01 DIAGNOSIS — M25511 Pain in right shoulder: Secondary | ICD-10-CM | POA: Diagnosis not present

## 2014-05-01 DIAGNOSIS — G894 Chronic pain syndrome: Secondary | ICD-10-CM | POA: Diagnosis not present

## 2014-05-01 DIAGNOSIS — M25562 Pain in left knee: Secondary | ICD-10-CM | POA: Diagnosis not present

## 2014-05-01 DIAGNOSIS — M179 Osteoarthritis of knee, unspecified: Secondary | ICD-10-CM | POA: Diagnosis not present

## 2014-05-01 DIAGNOSIS — M7551 Bursitis of right shoulder: Secondary | ICD-10-CM | POA: Diagnosis not present

## 2014-05-01 DIAGNOSIS — M75101 Unspecified rotator cuff tear or rupture of right shoulder, not specified as traumatic: Secondary | ICD-10-CM | POA: Diagnosis not present

## 2014-05-01 DIAGNOSIS — M47817 Spondylosis without myelopathy or radiculopathy, lumbosacral region: Secondary | ICD-10-CM | POA: Diagnosis not present

## 2014-05-01 DIAGNOSIS — M545 Low back pain: Secondary | ICD-10-CM | POA: Diagnosis not present

## 2014-05-03 DIAGNOSIS — M25511 Pain in right shoulder: Secondary | ICD-10-CM | POA: Diagnosis not present

## 2014-08-06 DIAGNOSIS — M25511 Pain in right shoulder: Secondary | ICD-10-CM | POA: Diagnosis not present

## 2014-08-06 DIAGNOSIS — Z79891 Long term (current) use of opiate analgesic: Secondary | ICD-10-CM | POA: Diagnosis not present

## 2014-08-06 DIAGNOSIS — M179 Osteoarthritis of knee, unspecified: Secondary | ICD-10-CM | POA: Diagnosis not present

## 2014-08-06 DIAGNOSIS — G894 Chronic pain syndrome: Secondary | ICD-10-CM | POA: Diagnosis not present

## 2014-09-02 DIAGNOSIS — F319 Bipolar disorder, unspecified: Secondary | ICD-10-CM | POA: Diagnosis not present

## 2014-10-20 ENCOUNTER — Encounter: Payer: Self-pay | Admitting: Internal Medicine

## 2014-10-24 ENCOUNTER — Telehealth: Payer: Self-pay | Admitting: Internal Medicine

## 2014-10-24 NOTE — Telephone Encounter (Signed)
Given her complex medical care I cannot recommend treatment until she is seen in the office. After reading this I'm aware of her financial constraints but she could be seen in the ER if she continues to bleed or have trouble with nausea and vomiting until she can afford follow-up with [place]

## 2014-10-24 NOTE — Telephone Encounter (Signed)
Left message for pt to call back.  Spoke with pt and she is aware and knows to go to the ER or urgent care if she worsens. Pt states she will call back to schedule an OV with our office when she knows her schedule.

## 2014-10-24 NOTE — Telephone Encounter (Signed)
Previous [name] [name] pt, has not been seen since he retired. Pt has history of UC and proctitis. Pt also has history of fissures and hemorrhoids. Pt states that yesterday am she started having rectal bleeding, states it is bright red blood and there are some clots. States she is not having any pain just some pressure. She also reports she started vomiting yesterday, states it is even hard for her to keep water down. Pt states her disability check will not go in until 10/26/14. States she cannot come in for a visit until next week. Pt wants to know what she can take. She has had canasa suppositories in the past along with phenergan supp. Dr.  Hilarie Fredrickson as doc of the day please advise.

## 2014-11-05 DIAGNOSIS — R300 Dysuria: Secondary | ICD-10-CM | POA: Diagnosis not present

## 2014-11-14 DIAGNOSIS — I10 Essential (primary) hypertension: Secondary | ICD-10-CM | POA: Diagnosis not present

## 2014-11-14 DIAGNOSIS — E782 Mixed hyperlipidemia: Secondary | ICD-10-CM | POA: Diagnosis not present

## 2014-11-14 DIAGNOSIS — R7309 Other abnormal glucose: Secondary | ICD-10-CM | POA: Diagnosis not present

## 2014-11-21 DIAGNOSIS — F319 Bipolar disorder, unspecified: Secondary | ICD-10-CM | POA: Diagnosis not present

## 2015-01-30 ENCOUNTER — Telehealth: Payer: Self-pay | Admitting: Gastroenterology

## 2015-01-30 NOTE — Telephone Encounter (Signed)
Per telephone note in September pt was told we cannot prescribe medication for her until she is seen. Pt instructed to schedule an appt and we could go from there. Suggested she call her PCP or urgent care or ER.

## 2015-02-06 DIAGNOSIS — M170 Bilateral primary osteoarthritis of knee: Secondary | ICD-10-CM | POA: Diagnosis not present

## 2015-02-06 DIAGNOSIS — M47813 Spondylosis without myelopathy or radiculopathy, cervicothoracic region: Secondary | ICD-10-CM | POA: Diagnosis not present

## 2015-02-06 DIAGNOSIS — G894 Chronic pain syndrome: Secondary | ICD-10-CM | POA: Diagnosis not present

## 2015-02-06 DIAGNOSIS — M25562 Pain in left knee: Secondary | ICD-10-CM | POA: Diagnosis not present

## 2015-02-06 DIAGNOSIS — M25561 Pain in right knee: Secondary | ICD-10-CM | POA: Diagnosis not present

## 2015-02-06 DIAGNOSIS — M7551 Bursitis of right shoulder: Secondary | ICD-10-CM | POA: Diagnosis not present

## 2015-02-06 DIAGNOSIS — M542 Cervicalgia: Secondary | ICD-10-CM | POA: Diagnosis not present

## 2015-02-06 DIAGNOSIS — M47817 Spondylosis without myelopathy or radiculopathy, lumbosacral region: Secondary | ICD-10-CM | POA: Diagnosis not present

## 2015-02-06 DIAGNOSIS — M75101 Unspecified rotator cuff tear or rupture of right shoulder, not specified as traumatic: Secondary | ICD-10-CM | POA: Diagnosis not present

## 2015-02-06 DIAGNOSIS — M25511 Pain in right shoulder: Secondary | ICD-10-CM | POA: Diagnosis not present

## 2015-02-06 DIAGNOSIS — M5137 Other intervertebral disc degeneration, lumbosacral region: Secondary | ICD-10-CM | POA: Diagnosis not present

## 2015-03-22 DIAGNOSIS — R300 Dysuria: Secondary | ICD-10-CM | POA: Diagnosis not present

## 2015-03-31 DIAGNOSIS — K644 Residual hemorrhoidal skin tags: Secondary | ICD-10-CM | POA: Diagnosis not present

## 2015-03-31 DIAGNOSIS — Z8719 Personal history of other diseases of the digestive system: Secondary | ICD-10-CM | POA: Diagnosis not present

## 2015-04-07 DIAGNOSIS — E782 Mixed hyperlipidemia: Secondary | ICD-10-CM | POA: Diagnosis not present

## 2015-04-07 DIAGNOSIS — I10 Essential (primary) hypertension: Secondary | ICD-10-CM | POA: Diagnosis not present

## 2015-04-07 DIAGNOSIS — K219 Gastro-esophageal reflux disease without esophagitis: Secondary | ICD-10-CM | POA: Diagnosis not present

## 2015-04-08 DIAGNOSIS — F319 Bipolar disorder, unspecified: Secondary | ICD-10-CM | POA: Diagnosis not present

## 2015-04-18 ENCOUNTER — Telehealth: Payer: Self-pay | Admitting: Internal Medicine

## 2015-04-18 ENCOUNTER — Telehealth: Payer: Self-pay | Admitting: Gastroenterology

## 2015-04-18 NOTE — Telephone Encounter (Signed)
Patient notified she can Recticare, Prep H suppositories, sitz baths until office visit on 04/28/15

## 2015-04-18 NOTE — Telephone Encounter (Signed)
Patient called c/o hemorrhoids burning, itching and some bright red blood small volume few drops. She had 3 hemorrhoidectomy surgeries in the past. She has appt with Dr [name] on March 6. She is using rectal hydrocortisone and sitz baths. Called to see if she can move her appt any sooner? Informed patient that its unlikely we have any opening and Dr Blanch Media RN will call her if he has any open appts before March 6

## 2015-04-18 NOTE — Telephone Encounter (Signed)
Spoke with patient and told her there are no early OV available.

## 2015-04-18 NOTE — Telephone Encounter (Signed)
Not established with me. Can see any provider ([facility] or APP) for sooner appointment

## 2015-04-28 ENCOUNTER — Telehealth: Payer: Self-pay | Admitting: Internal Medicine

## 2015-04-28 ENCOUNTER — Encounter: Payer: Self-pay | Admitting: Internal Medicine

## 2015-04-28 ENCOUNTER — Ambulatory Visit (INDEPENDENT_AMBULATORY_CARE_PROVIDER_SITE_OTHER): Payer: Medicare Other | Admitting: Internal Medicine

## 2015-04-28 VITALS — BP 126/80 | HR 104 | Ht 65.0 in | Wt 183.2 lb

## 2015-04-28 DIAGNOSIS — K219 Gastro-esophageal reflux disease without esophagitis: Secondary | ICD-10-CM

## 2015-04-28 DIAGNOSIS — R112 Nausea with vomiting, unspecified: Secondary | ICD-10-CM

## 2015-04-28 DIAGNOSIS — K648 Other hemorrhoids: Secondary | ICD-10-CM

## 2015-04-28 MED ORDER — HYDROCORTISONE ACETATE 25 MG RE SUPP: 25.0000 mg | Freq: Every day | RECTAL | Status: DC

## 2015-04-28 MED ORDER — PROMETHAZINE HCL 12.5 MG RE SUPP: 12.5000 mg | Freq: Four times a day (QID) | RECTAL | Status: DC | PRN

## 2015-04-28 NOTE — Telephone Encounter (Signed)
She called while I was on call.Marland KitchenMarland KitchenMarland KitchenAnusol HC suppositories $400+ - too expensive.  We had a fairly long discussion about her hemorrhoids - apparently has had multiple hemorrhoidectomies.  I recommended she ask PCP to refer to surgery for evaluation.  Will dc suppositories from med list.

## 2015-04-28 NOTE — Patient Instructions (Signed)
We have sent the following medications to your pharmacy for you to pick up at your convenience:  Phenergan, Anusol hc suppository

## 2015-04-28 NOTE — Telephone Encounter (Signed)
Patient checking back on her script

## 2015-04-28 NOTE — Progress Notes (Signed)
HISTORY OF PRESENT ILLNESS:  [name] [name] is a 55 y.o. female with bipolar disorder, a history of acute alcohol-induced pancreatitis (2014), GERD, and morbid obesity who presents today regarding chronic nausea with occasional vomiting and prolapsing hemorrhoids. The patient was previously cared for by Dr. Verl Blalock. Last office encounter 09/05/2012. She is sent today by her PCP. She tells me that she has had chronic nausea for years. Occasional vomiting of bile. She does have acid reflux symptoms for which she was recently placed on Dexilant. She states that this has helped. She has had Phenergan suppositories remotely for nausea. This helps. She requests a prescription. Her other complaint is "prolapsing hemorrhoids". Trivial discomfort and occasional pink blood on the tissue only. Normal BMs. She did undergo complete colonoscopy February 2013. This revealed mild diverticular change, small solitary rectal ulcer, no internal hemorrhoids, and external tags. Her bowels chronically fluctuate. No change. No other active complaints.  REVIEW OF SYSTEMS:  All non-GI ROS negative except for sinus allergy trouble, anxiety, arthritis, blood in urine, cough, depression, excessive thirst, excessive urination, urinary frequency, urinary leakage  Past Medical History  Diagnosis Date  . Personal history of colonic polyps 10/16/2009    hyperplastic  . Diverticulosis of colon (without mention of hemorrhage)   . Esophageal reflux   . Ulcerative colitis, unspecified   . Depressive disorder, not elsewhere classified   . Bipolar disorder ([place])   . Hiatal hernia   . Pre-diabetes   . Hypertension   . Anemia   . Hyperlipidemia   . Chronic kidney disease   . Anal fistula   . Obesity   . Iron deficiency   . Pancreatitis   . GERD (gastroesophageal reflux disease)   . Hepatic steatosis   . Pancreatitis     Past Surgical History  Procedure Laterality Date  . Finger amputation  2010    right index  .  Knee arthroscopy      bilateral, left x2  . Hemorroidectomy    . Total abdominal hysterectomy      Social History [name] [name]  reports that she has been smoking Cigarettes.  She has never used smokeless tobacco. She reports that she does not drink alcohol or use illicit drugs.  family history includes Diabetes in her father; Heart attack in her father; Hypertension in her father and mother; Irritable bowel syndrome in her mother; Ovarian cancer in her maternal aunt; Thyroid disease in her brother and sister.  No Known Allergies     PHYSICAL EXAMINATION: Vital signs: BP 126/80 mmHg  Pulse 104  Ht 5\' 5"  (1.651 m)  Wt 183 lb 4 oz (83.122 kg)  BMI 30.49 kg/m2  Constitutional: Pleasant, obese, generally well-appearing, no acute distress Psychiatric: alert and oriented x3, cooperative Eyes: extraocular movements intact, anicteric, conjunctiva pink Mouth: oral pharynx moist, no lesions Neck: supple without thyromegaly Lymph: no lymphadenopathy Cardiovascular: heart regular rate and rhythm, no murmur Lungs: clear to auscultation bilaterally Abdomen: soft, nontender, nondistended, no obvious ascites, no peritoneal signs, normal bowel sounds, no organomegaly Rectal: Small external hemorrhoids, external tag Extremities: no clubbing cyanosis or lower extremity edema bilaterally Skin: no lesions on visible extremities Neuro: No focal deficits. Cranial nerves intact  ASSESSMENT:  #1. Chronic nausea without alarm features. Normal upper endoscopy March 2013 for the same #2. GERD. May explain in part #1 above #3. Small external hemorrhoids #4. Unremarkable colonoscopy as described February 2013 #5. History of alcohol-related pancreatitis 2014   PLAN:  #1. Reflux precautions with attention  to weight loss #2. Continue PPI #3. Prescribe Phenergan suppositories to be used sparingly for severe nausea #4. Prescribed Anusol HC suppositories at night for symptomatic hemorrhoids #5.  Routine screening colonoscopy 2023. #6. Resume general medical care with PCP. Interval GI follow-up as needed

## 2015-04-29 ENCOUNTER — Telehealth: Payer: Self-pay | Admitting: Internal Medicine

## 2015-04-29 ENCOUNTER — Telehealth: Payer: Self-pay

## 2015-04-29 MED ORDER — HYDROCORTISONE 2.5 % RE CREA: 1.0000 "application " | TOPICAL_CREAM | Freq: Two times a day (BID) | RECTAL | Status: DC

## 2015-04-29 NOTE — Telephone Encounter (Signed)
Patient called back and said she had tried some over the counter cream which did not work.  She is going to try the Anusol cream and let me know if that does not work

## 2015-04-29 NOTE — Telephone Encounter (Signed)
As discussed previously with patient, sent rx for anusol cream to see if that is more affordable.  LM for patient to try this - should be cheaper than suppositories

## 2015-04-30 ENCOUNTER — Telehealth: Payer: Self-pay | Admitting: Gastroenterology

## 2015-04-30 NOTE — Telephone Encounter (Signed)
Pt calling back wanting to speak to [facility]

## 2015-04-30 NOTE — Telephone Encounter (Signed)
Patient called with c/o hemorrhoids. She was seen in the office on 04/28/15 and given Rx for Anusol suppository, was expensive and couldn't get it. She was given Anusol cream instead, reports that she has been using it for past few weeks with no improvement. Advised her to continue to do the supportive care and she will call back during office hours to discuss further plan

## 2015-05-01 ENCOUNTER — Telehealth: Payer: Self-pay

## 2015-05-01 NOTE — Telephone Encounter (Signed)
-----   Message from Irene Shipper, MD sent at 05/01/2015 10:58 AM EST ----- Her PCP can refer her to a general surgeon for evaluation ----- Message -----    From: Audrea Muscat, CMA    Sent: 05/01/2015   8:38 AM      To: Irene Shipper, MD  Patient has called several times with increasing problems with her hemorrhoids.  She was seen in the office 04/28/15 and prescribed Anusol suppositories.  These were too expensive so I sent in Anusol HC cream which is usually significantly cheaper.  She claims this is not helping at all (allegedly the same cream urgent care gave her so she says she had already been using it).  She claims her hemorrhoids are prolapsed and "hard" and extremely painful and that when she wipes there are little clots of blood.  Any other medication (like Proctofoam) is going to be expensive as well.  Not sure what else to tell her at this point.  Please advise.  :)   Go [employer] !  lol.

## 2015-05-01 NOTE — Telephone Encounter (Signed)
Patient called back (was tearful) and said that her PCP said we should be able to refer her to [facility] for hemorrhoids.  Since patient has Medicare she actually doesn't need a referral.  Her last office note was faxed to [facility] with STAT at the top.  I told patient this and gave her the [facility] phone number to follow up with.  Patient agreed.

## 2015-05-01 NOTE — Telephone Encounter (Signed)
Patient states that she has prolapsed hemorrhoids. She states that everything that has been called in has not helped. She states that that she has been passing blood clots in her stool. Please see 04/30/15 phone note. [facility] [phone].

## 2015-05-01 NOTE — Telephone Encounter (Signed)
Patient reiterated that her hemorrhoids are prolapsed, hard, and bleeding.  Anusol suppositories were too expensive and the Anusol cream has not worked.  Sent message to Dr. [name] for advice.  Told patient I would call her back when I heard from him.

## 2015-05-01 NOTE — Telephone Encounter (Signed)
Spoke with patient and told her that, per Dr. [name], she needed to contact her PCP to get a referral to a general surgeon for her hemorrhoids.  Patient agreed

## 2015-05-05 DIAGNOSIS — M47817 Spondylosis without myelopathy or radiculopathy, lumbosacral region: Secondary | ICD-10-CM | POA: Diagnosis not present

## 2015-05-05 DIAGNOSIS — M5137 Other intervertebral disc degeneration, lumbosacral region: Secondary | ICD-10-CM | POA: Diagnosis not present

## 2015-05-05 DIAGNOSIS — M25511 Pain in right shoulder: Secondary | ICD-10-CM | POA: Diagnosis not present

## 2015-05-05 DIAGNOSIS — M25562 Pain in left knee: Secondary | ICD-10-CM | POA: Diagnosis not present

## 2015-05-05 DIAGNOSIS — M7551 Bursitis of right shoulder: Secondary | ICD-10-CM | POA: Diagnosis not present

## 2015-05-05 DIAGNOSIS — M545 Low back pain: Secondary | ICD-10-CM | POA: Diagnosis not present

## 2015-05-05 DIAGNOSIS — M75101 Unspecified rotator cuff tear or rupture of right shoulder, not specified as traumatic: Secondary | ICD-10-CM | POA: Diagnosis not present

## 2015-05-05 DIAGNOSIS — M170 Bilateral primary osteoarthritis of knee: Secondary | ICD-10-CM | POA: Diagnosis not present

## 2015-05-05 DIAGNOSIS — G894 Chronic pain syndrome: Secondary | ICD-10-CM | POA: Diagnosis not present

## 2015-05-05 DIAGNOSIS — M25561 Pain in right knee: Secondary | ICD-10-CM | POA: Diagnosis not present

## 2015-05-09 DIAGNOSIS — K648 Other hemorrhoids: Secondary | ICD-10-CM | POA: Diagnosis not present

## 2015-05-11 ENCOUNTER — Other Ambulatory Visit: Payer: Self-pay | Admitting: Gastroenterology

## 2015-05-11 MED ORDER — PROMETHAZINE HCL 12.5 MG RE SUPP
12.5000 mg | RECTAL | Status: DC | PRN
Start: 2015-05-11 — End: 2015-05-11

## 2015-05-11 MED ORDER — PROMETHAZINE HCL 12.5 MG RE SUPP: 12.5000 mg | RECTAL | Status: DC | PRN

## 2015-05-12 DIAGNOSIS — K648 Other hemorrhoids: Secondary | ICD-10-CM | POA: Diagnosis not present

## 2015-05-17 DIAGNOSIS — R569 Unspecified convulsions: Secondary | ICD-10-CM | POA: Diagnosis not present

## 2015-05-17 DIAGNOSIS — E119 Type 2 diabetes mellitus without complications: Secondary | ICD-10-CM | POA: Diagnosis not present

## 2015-05-17 DIAGNOSIS — R531 Weakness: Secondary | ICD-10-CM | POA: Diagnosis not present

## 2015-05-17 DIAGNOSIS — Z87891 Personal history of nicotine dependence: Secondary | ICD-10-CM | POA: Diagnosis not present

## 2015-05-17 DIAGNOSIS — I10 Essential (primary) hypertension: Secondary | ICD-10-CM | POA: Diagnosis not present

## 2015-05-17 DIAGNOSIS — R410 Disorientation, unspecified: Secondary | ICD-10-CM | POA: Diagnosis not present

## 2015-05-20 ENCOUNTER — Ambulatory Visit: Payer: Medicare Other | Admitting: Gastroenterology

## 2015-05-22 DIAGNOSIS — G40319 Generalized idiopathic epilepsy and epileptic syndromes, intractable, without status epilepticus: Secondary | ICD-10-CM | POA: Diagnosis not present

## 2015-05-27 DIAGNOSIS — G40319 Generalized idiopathic epilepsy and epileptic syndromes, intractable, without status epilepticus: Secondary | ICD-10-CM | POA: Diagnosis not present

## 2015-05-27 DIAGNOSIS — I672 Cerebral atherosclerosis: Secondary | ICD-10-CM | POA: Diagnosis not present

## 2015-05-27 DIAGNOSIS — G94 Other disorders of brain in diseases classified elsewhere: Secondary | ICD-10-CM | POA: Diagnosis not present

## 2015-05-28 DIAGNOSIS — G40319 Generalized idiopathic epilepsy and epileptic syndromes, intractable, without status epilepticus: Secondary | ICD-10-CM | POA: Diagnosis not present

## 2015-06-04 DIAGNOSIS — I672 Cerebral atherosclerosis: Secondary | ICD-10-CM | POA: Diagnosis not present

## 2015-06-04 DIAGNOSIS — I6522 Occlusion and stenosis of left carotid artery: Secondary | ICD-10-CM | POA: Diagnosis not present

## 2015-06-06 DIAGNOSIS — G40319 Generalized idiopathic epilepsy and epileptic syndromes, intractable, without status epilepticus: Secondary | ICD-10-CM | POA: Diagnosis not present

## 2015-06-06 DIAGNOSIS — I672 Cerebral atherosclerosis: Secondary | ICD-10-CM | POA: Diagnosis not present

## 2015-06-16 DIAGNOSIS — F319 Bipolar disorder, unspecified: Secondary | ICD-10-CM | POA: Diagnosis not present

## 2015-07-07 ENCOUNTER — Telehealth: Payer: Self-pay | Admitting: Internal Medicine

## 2015-07-07 NOTE — Telephone Encounter (Signed)
Patient calling back regarding this. [facility] # [phone]

## 2015-07-07 NOTE — Telephone Encounter (Signed)
Sent Dr. [name] staff message asking if it is ok to refill Phenergan.  Awaiting response.

## 2015-07-07 NOTE — Telephone Encounter (Signed)
Magda Paganini, this patient called tonight regarding phenergan. I told her that you would take care of this first thing. Thanks

## 2015-07-08 ENCOUNTER — Telehealth: Payer: Self-pay

## 2015-07-08 MED ORDER — PROMETHAZINE HCL 12.5 MG RE SUPP: 12.5000 mg | Freq: Four times a day (QID) | RECTAL | Status: DC | PRN

## 2015-07-08 NOTE — Telephone Encounter (Signed)
Spoke with patient and told her that I sent the Phenergan rx to [facility] in [place], [place].  Patient acknowledged and understood

## 2015-07-08 NOTE — Telephone Encounter (Signed)
Sent Phenergan to [facility] [place] per patient's request

## 2015-07-08 NOTE — Telephone Encounter (Signed)
-----   Message from Irene Shipper, MD sent at 07/07/2015  4:54 PM EDT ----- Faythe Ghee to refill Phenergan for her. Document this as a phone note. Thanks ----- Message -----    From: Audrea Muscat, CMA    Sent: 07/07/2015   4:13 PM      To: Irene Shipper, MD  Patient last seen in office 04/28/2015.  Given Phenergan and told to use sparingly for severe nausea.  Patient calling stating that she is in [place] because of the death of her brother and forgot all her meds at home.  She is requesting a refill of Phenergan be sent to a pharmacy in [place].  Please advise.

## 2015-08-08 ENCOUNTER — Encounter (HOSPITAL_COMMUNITY): Payer: Self-pay | Admitting: *Deleted

## 2015-08-08 ENCOUNTER — Emergency Department (HOSPITAL_COMMUNITY)
Admission: EM | Admit: 2015-08-08 | Discharge: 2015-08-08 | Disposition: A | Discharge status: Home / Self Care | Payer: Medicare Other | Attending: Emergency Medicine | Admitting: Emergency Medicine

## 2015-08-08 DIAGNOSIS — F329 Major depressive disorder, single episode, unspecified: Secondary | ICD-10-CM | POA: Insufficient documentation

## 2015-08-08 DIAGNOSIS — I129 Hypertensive chronic kidney disease with stage 1 through stage 4 chronic kidney disease, or unspecified chronic kidney disease: Secondary | ICD-10-CM | POA: Diagnosis not present

## 2015-08-08 DIAGNOSIS — F17210 Nicotine dependence, cigarettes, uncomplicated: Secondary | ICD-10-CM | POA: Diagnosis not present

## 2015-08-08 DIAGNOSIS — Z79899 Other long term (current) drug therapy: Secondary | ICD-10-CM | POA: Diagnosis not present

## 2015-08-08 DIAGNOSIS — N189 Chronic kidney disease, unspecified: Secondary | ICD-10-CM | POA: Insufficient documentation

## 2015-08-08 DIAGNOSIS — Z8669 Personal history of other diseases of the nervous system and sense organs: Secondary | ICD-10-CM | POA: Diagnosis not present

## 2015-08-08 DIAGNOSIS — E785 Hyperlipidemia, unspecified: Secondary | ICD-10-CM | POA: Diagnosis not present

## 2015-08-08 DIAGNOSIS — N390 Urinary tract infection, site not specified: Secondary | ICD-10-CM | POA: Diagnosis not present

## 2015-08-08 DIAGNOSIS — R300 Dysuria: Secondary | ICD-10-CM | POA: Diagnosis present

## 2015-08-08 HISTORY — DX: Personal history of urinary (tract) infections: Z87.440

## 2015-08-08 HISTORY — DX: Unspecified convulsions: R56.9

## 2015-08-08 LAB — URINE MICROSCOPIC-ADD ON

## 2015-08-08 LAB — URINALYSIS, ROUTINE W REFLEX MICROSCOPIC
Glucose, UA: NEGATIVE mg/dL
Ketones, ur: 40 mg/dL — AB
NITRITE: POSITIVE — AB
Protein, ur: >300 mg/dL — AB
SPECIFIC GRAVITY, URINE: 1.026 (ref 1.005–1.030)
pH: 5 (ref 5.0–8.0)

## 2015-08-08 MED ORDER — LIDOCAINE HCL 1 % IJ SOLN
INTRAMUSCULAR | Status: AC
  Administered 2015-08-08: 2.1 mL
  Filled 2015-08-08: qty 20

## 2015-08-08 MED ORDER — ONDANSETRON HCL 4 MG PO TABS: 4.0000 mg | ORAL_TABLET | Freq: Four times a day (QID) | ORAL | Status: DC

## 2015-08-08 MED ORDER — ONDANSETRON 8 MG PO TBDP
8.0000 mg | ORAL_TABLET | Freq: Once | ORAL | Status: AC
  Administered 2015-08-08: 8 mg via ORAL
  Filled 2015-08-08: qty 1

## 2015-08-08 MED ORDER — CEFTRIAXONE SODIUM 1 G IJ SOLR
1.0000 g | Freq: Once | INTRAMUSCULAR | Status: AC
  Administered 2015-08-08: 1 g via INTRAMUSCULAR
  Filled 2015-08-08: qty 10

## 2015-08-08 MED ORDER — CEPHALEXIN 500 MG PO CAPS: 1000.0000 mg | ORAL_CAPSULE | Freq: Two times a day (BID) | ORAL | Status: DC

## 2015-08-08 NOTE — Discharge Instructions (Signed)

## 2015-08-08 NOTE — ED Provider Notes (Signed)
CSN: [id number]     Arrival date & time 08/08/15  0245 History   First MD Initiated Contact with Patient 08/08/15 [phone]     Chief Complaint  Patient presents with  . Urinary Tract Infection     (Consider location/radiation/quality/duration/timing/severity/associated sxs/prior Treatment) HPI Comments: Patient presents to the emergency department for evaluation of urinary frequency, urinary urgency, dysuria and blood in her urine. Patient reports that symptoms began 4 days ago and have progressively worsened. She has a history of frequent urinary tract infections. She is scheduled to have a follow-up appointment with urology in 3 weeks. Patient has had nausea associated with her symptoms. She has not had a fever.  Patient is a 55 y.o. female presenting with urinary tract infection.  Urinary Tract Infection Associated symptoms: nausea     Past Medical History  Diagnosis Date  . Personal history of colonic polyps 10/16/2009    hyperplastic  . Diverticulosis of colon (without mention of hemorrhage)   . Esophageal reflux   . Ulcerative colitis, unspecified   . Depressive disorder, not elsewhere classified   . Bipolar disorder ([place])   . Hiatal hernia   . Pre-diabetes   . Hypertension   . Anemia   . Hyperlipidemia   . Chronic kidney disease   . Anal fistula   . Obesity   . Iron deficiency   . Pancreatitis   . GERD (gastroesophageal reflux disease)   . Hepatic steatosis   . Pancreatitis   . History of recurrent UTIs   . Seizures [facility])    Past Surgical History  Procedure Laterality Date  . Finger amputation  2010    right index  . Knee arthroscopy      bilateral, left x2  . Hemorroidectomy    . Total abdominal hysterectomy     Family History  Problem Relation Age of Onset  . Irritable bowel syndrome Mother   . Ovarian cancer Maternal Aunt   . Diabetes Father   . Heart attack Father   . Hypertension Father   . Hypertension Mother   . Thyroid disease Sister   . Thyroid  disease Brother    Social History  Substance Use Topics  . Smoking status: Current Every Day Smoker    Types: Cigarettes  . Smokeless tobacco: Never Used     Comment: pt states she smokes 1-2 cigarettes/ day  . Alcohol Use: No   OB History    No data available     Review of Systems  Gastrointestinal: Positive for nausea.  Genitourinary: Positive for dysuria, urgency, frequency and hematuria.  All other systems reviewed and are negative.     Allergies  Review of patient's allergies indicates no known allergies.  Home Medications   Prior to Admission medications   Medication Sig Start Date End Date Taking? Authorizing Provider  alum & mag hydroxide-simeth (MAALOX/MYLANTA) 200-200-20 MG/5ML suspension Take 30 mLs by mouth every 6 (six) hours as needed for indigestion or heartburn (dyspepsia). 03/15/13   Velvet Bathe, MD  ARIPiprazole (ABILIFY) 15 MG tablet Take 15 mg by mouth daily.      Historical Provider, MD  dexlansoprazole (DEXILANT) 60 MG capsule Take 60 mg by mouth daily.    Historical Provider, MD  FLUoxetine (PROZAC) 20 MG tablet Take 20 mg by mouth daily.     Historical Provider, MD  fluticasone (FLONASE) 50 MCG/ACT nasal spray Place 2 sprays into the nose daily.    Historical Provider, MD  hydrocortisone (ANUSOL-HC) 2.5 % rectal cream Place  1 application rectally 2 (two) times daily. 04/29/15   Irene Shipper, MD  lamoTRIgine (LAMICTAL) 100 MG tablet Take 100 mg by mouth daily.     Historical Provider, MD  losartan-hydrochlorothiazide (HYZAAR) 50-12.5 MG tablet Take 1 tablet by mouth every morning. 04/06/15   Historical Provider, MD  Multiple Vitamin (MULTIVITAMIN WITH MINERALS) TABS tablet Take 1 tablet by mouth daily. 03/15/13   Velvet Bathe, MD  promethazine (PHENERGAN) 12.5 MG suppository Place 1 suppository (12.5 mg total) rectally every 4 (four) hours as needed for nausea. 05/11/15   Milus Banister, MD  promethazine (PHENERGAN) 12.5 MG suppository Place 1 suppository  (12.5 mg total) rectally every 6 (six) hours as needed for nausea or vomiting. 07/08/15   Irene Shipper, MD   BP 121/89 mmHg  Pulse 107  Temp(Src) 98.9 F (37.2 C) (Oral)  Resp 16  SpO2 95% Physical Exam  Constitutional: She is oriented to person, place, and time. She appears well-developed and well-nourished. No distress.  HENT:  Head: Normocephalic and atraumatic.  Right Ear: Hearing normal.  Left Ear: Hearing normal.  Nose: Nose normal.  Mouth/Throat: Oropharynx is clear and moist and mucous membranes are normal.  Eyes: Conjunctivae and EOM are normal. Pupils are equal, round, and reactive to light.  Neck: Normal range of motion. Neck supple.  Cardiovascular: Regular rhythm, S1 normal and S2 normal.  Exam reveals no gallop and no friction rub.   No murmur heard. Pulmonary/Chest: Effort normal and breath sounds normal. No respiratory distress. She exhibits no tenderness.  Abdominal: Soft. Normal appearance and bowel sounds are normal. There is no hepatosplenomegaly. There is no tenderness. There is no rebound, no guarding, no CVA tenderness, no tenderness at McBurney's point and negative Murphy's sign. No hernia.  Musculoskeletal: Normal range of motion.  Neurological: She is alert and oriented to person, place, and time. She has normal strength. No cranial nerve deficit or sensory deficit. Coordination normal. GCS eye subscore is 4. GCS verbal subscore is 5. GCS motor subscore is 6.  Skin: Skin is warm, dry and intact. No rash noted. No cyanosis.  Psychiatric: She has a normal mood and affect. Her speech is normal and behavior is normal. Thought content normal.  Nursing note and vitals reviewed.   ED Course  Procedures (including critical care time) Labs Review Labs Reviewed  URINALYSIS, ROUTINE W REFLEX MICROSCOPIC (NOT AT [facility]) - Abnormal; Notable for the following:    Color, Urine ORANGE (*)    APPearance TURBID (*)    Hgb urine dipstick LARGE (*)    Bilirubin Urine MODERATE  (*)    Ketones, ur 40 (*)    Protein, ur >300 (*)    Nitrite POSITIVE (*)    Leukocytes, UA LARGE (*)    All other components within normal limits  URINE MICROSCOPIC-ADD ON - Abnormal; Notable for the following:    Squamous Epithelial / LPF 0-5 (*)    Bacteria, UA MANY (*)    All other components within normal limits  URINE CULTURE    Imaging Review No results found. I have personally reviewed and evaluated these images and lab results as part of my medical decision-making.   EKG Interpretation None      MDM   Final diagnoses:  None  UTI  Patient with symptoms and urinalysis consistent with cystitis. She has follow-up with urology already scheduled. Will administer Rocephin and continue outpatient Keflex. Culture pending. Bladder scan performed to ensure that she is not obstructed by clots.  Orpah Greek, MD 08/08/15 [phone]

## 2015-08-08 NOTE — ED Notes (Signed)
Pt c/o urinary frequency and urgency for the last several days; pt states that she began passing small clots this afternoon; pt states that she has been taking Azo over the counter but that it hasn't helped the sx; pt states that she has a hx of frequent UTI's and has had her urethrea dilated in the past

## 2015-08-08 NOTE — ED Notes (Signed)
48 mL left in bladder after urinating

## 2015-08-10 LAB — URINE CULTURE: Culture: >=100000 — AB

## 2015-08-11 ENCOUNTER — Telehealth (HOSPITAL_BASED_OUTPATIENT_CLINIC_OR_DEPARTMENT_OTHER): Payer: Self-pay | Admitting: Emergency Medicine

## 2015-08-11 NOTE — Telephone Encounter (Signed)
Post ED Visit - Positive Culture Follow-up  Culture report reviewed by antimicrobial stewardship pharmacist:  []  Elenor Quinones, Pharm.D. []  Heide Guile, Pharm.D., BCPS []  Parks Neptune, Pharm.D. []  Alycia Rossetti, Pharm.D., BCPS []  [place], [place].D., BCPS, AAHIVP []  Legrand Como, Pharm.D., BCPS, AAHIVP [x]  Milus Glazier, Pharm.D. []  Stephens November, Pharm.D.  Positive urine culture Treated with cephalexin, organism sensitive to the same and no further patient follow-up is required at this time.  [place], [place] 08/11/2015, 10:45 AM

## 2015-08-19 ENCOUNTER — Institutional Professional Consult (permissible substitution): Payer: Medicare Other | Admitting: Internal Medicine

## 2015-09-01 DIAGNOSIS — N3021 Other chronic cystitis with hematuria: Secondary | ICD-10-CM | POA: Diagnosis not present

## 2015-09-04 DIAGNOSIS — M5416 Radiculopathy, lumbar region: Secondary | ICD-10-CM | POA: Diagnosis not present

## 2015-09-04 DIAGNOSIS — M179 Osteoarthritis of knee, unspecified: Secondary | ICD-10-CM | POA: Diagnosis not present

## 2015-09-04 DIAGNOSIS — M461 Sacroiliitis, not elsewhere classified: Secondary | ICD-10-CM | POA: Diagnosis not present

## 2015-09-04 DIAGNOSIS — M47817 Spondylosis without myelopathy or radiculopathy, lumbosacral region: Secondary | ICD-10-CM | POA: Diagnosis not present

## 2015-09-19 DIAGNOSIS — M5126 Other intervertebral disc displacement, lumbar region: Secondary | ICD-10-CM | POA: Diagnosis not present

## 2015-09-19 DIAGNOSIS — M5416 Radiculopathy, lumbar region: Secondary | ICD-10-CM | POA: Diagnosis not present

## 2015-09-19 DIAGNOSIS — M5136 Other intervertebral disc degeneration, lumbar region: Secondary | ICD-10-CM | POA: Diagnosis not present

## 2015-09-26 DIAGNOSIS — M791 Myalgia: Secondary | ICD-10-CM | POA: Diagnosis not present

## 2015-09-26 DIAGNOSIS — M47817 Spondylosis without myelopathy or radiculopathy, lumbosacral region: Secondary | ICD-10-CM | POA: Diagnosis not present

## 2015-09-26 DIAGNOSIS — M5416 Radiculopathy, lumbar region: Secondary | ICD-10-CM | POA: Diagnosis not present

## 2015-09-26 DIAGNOSIS — M533 Sacrococcygeal disorders, not elsewhere classified: Secondary | ICD-10-CM | POA: Diagnosis not present

## 2015-10-03 DIAGNOSIS — M19011 Primary osteoarthritis, right shoulder: Secondary | ICD-10-CM | POA: Diagnosis not present

## 2015-10-20 ENCOUNTER — Ambulatory Visit: Payer: Medicare Other | Admitting: Internal Medicine

## 2015-10-24 DIAGNOSIS — M19011 Primary osteoarthritis, right shoulder: Secondary | ICD-10-CM | POA: Diagnosis not present

## 2015-10-24 DIAGNOSIS — M791 Myalgia: Secondary | ICD-10-CM | POA: Diagnosis not present

## 2015-10-24 DIAGNOSIS — M533 Sacrococcygeal disorders, not elsewhere classified: Secondary | ICD-10-CM | POA: Diagnosis not present

## 2015-10-24 DIAGNOSIS — M47817 Spondylosis without myelopathy or radiculopathy, lumbosacral region: Secondary | ICD-10-CM | POA: Diagnosis not present

## 2015-10-24 DIAGNOSIS — M5416 Radiculopathy, lumbar region: Secondary | ICD-10-CM | POA: Diagnosis not present

## 2015-10-24 DIAGNOSIS — M4726 Other spondylosis with radiculopathy, lumbar region: Secondary | ICD-10-CM | POA: Diagnosis not present

## 2015-10-30 ENCOUNTER — Encounter: Payer: Self-pay | Admitting: Nurse Practitioner

## 2015-10-30 ENCOUNTER — Ambulatory Visit (INDEPENDENT_AMBULATORY_CARE_PROVIDER_SITE_OTHER): Payer: Medicare Other | Admitting: Nurse Practitioner

## 2015-10-30 ENCOUNTER — Other Ambulatory Visit (INDEPENDENT_AMBULATORY_CARE_PROVIDER_SITE_OTHER): Payer: Medicare Other

## 2015-10-30 ENCOUNTER — Telehealth: Payer: Self-pay | Admitting: Nurse Practitioner

## 2015-10-30 VITALS — BP 130/88 | HR 95 | Temp 97.7°F | Wt 190.0 lb

## 2015-10-30 DIAGNOSIS — K219 Gastro-esophageal reflux disease without esophagitis: Secondary | ICD-10-CM

## 2015-10-30 DIAGNOSIS — R29898 Other symptoms and signs involving the musculoskeletal system: Secondary | ICD-10-CM

## 2015-10-30 DIAGNOSIS — M791 Myalgia, unspecified site: Secondary | ICD-10-CM

## 2015-10-30 DIAGNOSIS — I10 Essential (primary) hypertension: Secondary | ICD-10-CM | POA: Diagnosis not present

## 2015-10-30 DIAGNOSIS — D649 Anemia, unspecified: Secondary | ICD-10-CM

## 2015-10-30 DIAGNOSIS — F319 Bipolar disorder, unspecified: Secondary | ICD-10-CM

## 2015-10-30 DIAGNOSIS — R739 Hyperglycemia, unspecified: Secondary | ICD-10-CM | POA: Diagnosis not present

## 2015-10-30 DIAGNOSIS — R635 Abnormal weight gain: Secondary | ICD-10-CM

## 2015-10-30 DIAGNOSIS — E785 Hyperlipidemia, unspecified: Secondary | ICD-10-CM

## 2015-10-30 LAB — CARDIAC PANEL
CK MB: 2.7 ng/mL (ref 0.3–4.0)
CK TOTAL: 68 U/L (ref 7–177)
RELATIVE INDEX: 4 calc — AB (ref 0.0–2.5)

## 2015-10-30 LAB — COMPREHENSIVE METABOLIC PANEL
ALT: 59 U/L — AB (ref 0–35)
AST: 66 U/L — AB (ref 0–37)
Albumin: 3.7 g/dL (ref 3.5–5.2)
Alkaline Phosphatase: 75 U/L (ref 39–117)
BILIRUBIN TOTAL: 0.6 mg/dL (ref 0.2–1.2)
BUN: 15 mg/dL (ref 6–23)
CO2: 29 meq/L (ref 19–32)
Calcium: 8.8 mg/dL (ref 8.4–10.5)
Chloride: 103 mEq/L (ref 96–112)
Creatinine, Ser: 0.62 mg/dL (ref 0.40–1.20)
GFR: 106.31 mL/min (ref >60.00)
GLUCOSE: 142 mg/dL — AB (ref 70–99)
Potassium: 4.5 mEq/L (ref 3.5–5.1)
SODIUM: 139 meq/L (ref 135–145)
Total Protein: 7 g/dL (ref 6.0–8.3)

## 2015-10-30 LAB — CBC WITH DIFFERENTIAL/PLATELET
BASOS ABS: 0 10*3/uL (ref 0.0–0.1)
Basophils Relative: 0.5 % (ref 0.0–3.0)
EOS ABS: 0.1 10*3/uL (ref 0.0–0.7)
Eosinophils Relative: 1.4 % (ref 0.0–5.0)
HCT: 37.9 % (ref 36.0–46.0)
Hemoglobin: 13.1 g/dL (ref 12.0–15.0)
LYMPHS ABS: 1.4 10*3/uL (ref 0.7–4.0)
Lymphocytes Relative: 17.9 % (ref 12.0–46.0)
MCHC: 34.5 g/dL (ref 30.0–36.0)
MCV: 102.8 fl — ABNORMAL HIGH (ref 78.0–100.0)
MONO ABS: 0.7 10*3/uL (ref 0.1–1.0)
Monocytes Relative: 8.6 % (ref 3.0–12.0)
NEUTROS ABS: 5.6 10*3/uL (ref 1.4–7.7)
NEUTROS PCT: 71.6 % (ref 43.0–77.0)
Platelets: 268 10*3/uL (ref 150.0–400.0)
RBC: 3.69 Mil/uL — ABNORMAL LOW (ref 3.87–5.11)
RDW: 13.5 % (ref 11.5–15.5)
WBC: 7.9 10*3/uL (ref 4.0–10.5)

## 2015-10-30 LAB — SEDIMENTATION RATE: SED RATE: 68 mm/h — AB (ref 0–30)

## 2015-10-30 LAB — CORTISOL: Cortisol, Plasma: 1.9 ug/dL

## 2015-10-30 LAB — LIPID PANEL
CHOLESTEROL: 315 mg/dL — AB (ref 0–200)
HDL: 126.7 mg/dL (ref >39.00)
LDL CALC: 160 mg/dL — AB (ref 0–99)
NonHDL: 188.23
TRIGLYCERIDES: 140 mg/dL (ref 0.0–149.0)
Total CHOL/HDL Ratio: 2
VLDL: 28 mg/dL (ref 0.0–40.0)

## 2015-10-30 LAB — TSH: TSH: 2.7 u[IU]/mL (ref 0.35–4.50)

## 2015-10-30 LAB — T4, FREE: Free T4: 0.99 ng/dL (ref 0.60–1.60)

## 2015-10-30 LAB — HEMOGLOBIN A1C: HEMOGLOBIN A1C: 6.7 % — AB (ref 4.6–6.5)

## 2015-10-30 NOTE — Assessment & Plan Note (Signed)
Controlled with phenergan prn Endoscopy done 2013 (normal)

## 2015-10-30 NOTE — Assessment & Plan Note (Signed)
Stable with use of omeprazole

## 2015-10-30 NOTE — Telephone Encounter (Signed)
She will have to make an appointment to be seen about this complaint. I cannot advise about the same time I did not evaluate her for. Thank you

## 2015-10-30 NOTE — Assessment & Plan Note (Signed)
Managed by [facility]. They prescribe Prozac, Lamictal, and Abilify. Has upcoming appt in October 2017.

## 2015-10-30 NOTE — Patient Instructions (Signed)
Will call with lab results prior to refilling medications. Go downstairs for lab draw

## 2015-10-30 NOTE — Assessment & Plan Note (Signed)
Denies any sign of active bleeding. CBC ordered

## 2015-10-30 NOTE — Progress Notes (Signed)
Pre visit review using our clinic review tool, if applicable. No additional management support is needed unless otherwise documented below in the visit note. 

## 2015-10-30 NOTE — Progress Notes (Signed)
Subjective:    Patient ID: [name] [name], female    DOB: 10-Sep-1960, 55 y.o.   MRN: [id number]  Patient presents today To establish care(previous primary provider was Dr. Criss Rosales), discuss current symptoms of leg weakness and muscle ache.  Muscle Pain  This is a new problem. The current episode started 1 to 4 weeks ago. The problem occurs constantly. The problem has been gradually worsening since onset. Associated with: No injury. Pain location: Bilateral thighs and calf muscle. The pain is medium. The symptoms are aggravated by any movement. Associated symptoms include fatigue and weakness. Pertinent negatives include no abdominal pain, chest pain, constipation, diarrhea, dysuria, fever, headaches, joint swelling, nausea, stiffness, sensory change, shortness of breath, swollen glands, urinary symptoms, vaginal discharge, visual change, vomiting or wheezing. Past treatments include nothing. There is no swelling present. She has been behaving normally. Her past medical history is significant for chronic back pain. There is no history of rheumatic disease or sickle cell disease.  Onset of symptoms after Kenalog intramuscular injections one month ago. Also noticed a weight gain and increased appetite. Kenalog injections were given in trapezius muscles (bilateral). Dose of Lipitor has not been changed recently. Taking medications as prescribed.  HTN: Stable with losartan/HCTZ.  Anemia:  Has not noticed any signs of active bleeding: No blood in stool, no black stool, no abnormal bruising.  GERD and Ulceratice Colitis: Stable with omeprazole. Managed by GI Last endoscopy 2013: Normal Last colonoscopy 2013: Unremarkable. She is to follow-up with GI when necessary for any GI symptoms.  Bipolar disorder: Stable with Prozac Abilify and Lamictal. Managed by [facility] mental [facility]. Upcoming appointment in October.  Seizure: Had one episode of seizure-like activity weakness by sister in March 2017.  She was evaluated by neurologist [facility]. EEG, MRI of brain were normal per patient. Patient states she was told seizure activity possibly triggered by stress. No medication was prescribed at the time.  Immunizations: (TDAP, Hep C screen, Pneumovax, Influenza, zoster): Declined any immunizations today due to acute symptoms muscle ache and leg weakness. Health Maintenance  Topic Date Due  . HIV Screening  01/24/1976  . Tetanus Vaccine  01/24/1980  . Pap Smear  01/23/1982  . Mammogram  01/24/2011  . Flu Shot  05/23/2016*  . Colon Cancer Screening  04/11/2021  .  Hepatitis C: One time screening is recommended by Center for Disease Control  (CDC) for  adults born from [birth year] through 1965.   Completed  *Topic was postponed. The date shown is not the original due date.   Diet:Healthy Weight: Concerned about ascending weight gain after Kenalog injections in shoulders. Injections given by pain provider one month ago. Wt Readings from Last 3 Encounters:  10/30/15 190 lb (86.2 kg)  04/28/15 183 lb 4 oz (83.1 kg)  03/04/14 178 lb (80.7 kg)    Exercise:walking, but has not been able to do any walking recently due to leg symptoms.  Depression/Suicide: Denies any. No flowsheet data found. No flowsheet data found. Colonoscopy (every 5-31yr, >50-770yr: Up-to-date Pap Smear (every 3y37yror >21-29 without HPV, every 13yr59yrr >30-613yr513yrh HPV): Not needed, had total hysterectomy including oophorectomy Mammogram (yearly, >413yrs2yreded Advanced Directive: Advanced Directives 08/08/2015  Does patient have an advance directive? No  Would patient like information on creating an advanced directive? No - patient declined information  Pre-existing out of facility DNR order (yellow form or pink MOST form) -   Sexual History (birth control, marital status, STD): Sexually active with female partner.  Hepatitis C screening  Medications and allergies reviewed with patient and updated if  appropriate.  Patient Active Problem List   Diagnosis Date Noted  . SIRS (systemic inflammatory response syndrome) ([place]) 03/11/2013  . Hypertension   . Hyperlipidemia   . Anemia   . Hematochezia 08/14/2012  . ETOH abuse 08/14/2012  . Pseudocyst of pancreas 08/12/2012  . Acute pancreatitis 08/12/2012  . GERD (gastroesophageal reflux disease) 06/08/2012  . Rectal fissure 05/11/2011  . Rectal fistula 05/11/2011  . Hiatal hernia 04/28/2011  . Gastritis 04/28/2011  . Nausea 04/27/2011  . Acute epigastric pain 04/27/2011  . Obesity, Class III, BMI 40-49.9 (morbid obesity) ([place]) 04/27/2011  . Solitary rectal ulcer 04/12/2011  . Hemorrhoids 03/10/2011  . Ulcerative proctitis, nonspecific ([place]) 03/10/2011  . DIVERTICULOSIS, COLON 10/22/2009  . COLONIC POLYPS, HYPERPLASTIC, HX OF 10/22/2009  . INFECTIOUS DIARRHEA 09/27/2008  . ULCERATIVE PROCTITIS 09/27/2008  . NAUSEA AND VOMITING 09/27/2008  . NAUSEA ALONE 09/27/2008  . DIARRHEA 09/27/2008  . ABDOMINAL PAIN -GENERALIZED 09/27/2008  . DEPRESSION 09/12/2007  . HEMORRHOIDS 09/12/2007  . HIATAL HERNIA 09/12/2007  . ULCERATIVE COLITIS 09/12/2007  . RECTAL PAIN 09/12/2007  . BIPOLAR AFFECTIVE DISORDER 12/06/2006  . TOBACCO ABUSE 12/06/2006  . Esophageal reflux 12/06/2006  . Cough 12/06/2006    Current Outpatient Prescriptions on File Prior to Visit  Medication Sig Dispense Refill  . dexlansoprazole (DEXILANT) 60 MG capsule Take 60 mg by mouth daily.    Marland Kitchen FLUoxetine (PROZAC) 20 MG tablet Take 20 mg by mouth daily. Patient taking '40mg'$  daily    . fluticasone (FLONASE) 50 MCG/ACT nasal spray Place 2 sprays into the nose daily.    Marland Kitchen lamoTRIgine (LAMICTAL) 100 MG tablet Take 100 mg by mouth daily. Patient taking x2 daily    . losartan-hydrochlorothiazide (HYZAAR) 50-12.5 MG tablet Take 1 tablet by mouth every morning.  0  . Multiple Vitamin (MULTIVITAMIN WITH MINERALS) TABS tablet Take 1 tablet by mouth daily. 30 tablet 0  . alum & mag  hydroxide-simeth (MAALOX/MYLANTA) 200-200-20 MG/5ML suspension Take 30 mLs by mouth every 6 (six) hours as needed for indigestion or heartburn (dyspepsia). (Patient not taking: Reported on 10/30/2015) 355 mL 0  . ARIPiprazole (ABILIFY) 15 MG tablet Take 15 mg by mouth daily.      . cephALEXin (KEFLEX) 500 MG capsule Take 2 capsules (1,000 mg total) by mouth 2 (two) times daily. (Patient not taking: Reported on 10/30/2015) 40 capsule 0  . hydrocortisone (ANUSOL-HC) 2.5 % rectal cream Place 1 application rectally 2 (two) times daily. (Patient not taking: Reported on 10/30/2015) 30 g 1  . ondansetron (ZOFRAN) 4 MG tablet Take 1 tablet (4 mg total) by mouth every 6 (six) hours. (Patient not taking: Reported on 10/30/2015) 12 tablet 0  . promethazine (PHENERGAN) 12.5 MG suppository Place 1 suppository (12.5 mg total) rectally every 4 (four) hours as needed for nausea. (Patient not taking: Reported on 10/30/2015) 30 each 0  . promethazine (PHENERGAN) 12.5 MG suppository Place 1 suppository (12.5 mg total) rectally every 6 (six) hours as needed for nausea or vomiting. (Patient not taking: Reported on 10/30/2015) 30 each 0   No current facility-administered medications on file prior to visit.     Past Medical History:  Diagnosis Date  . Anal fistula   . Anemia   . Bipolar disorder ([place])   . Chronic kidney disease   . Depressive disorder, not elsewhere classified   . Diverticulosis of colon (without mention of hemorrhage)   . Esophageal reflux   .  GERD (gastroesophageal reflux disease)   . Hepatic steatosis   . Hiatal hernia   . History of recurrent UTIs   . Hyperlipidemia   . Hypertension   . Iron deficiency   . Obesity   . Pancreatitis   . Pancreatitis   . Personal history of colonic polyps 10/16/2009   hyperplastic  . Pre-diabetes   . Seizures ([place])   . Ulcerative colitis, unspecified     Past Surgical History:  Procedure Laterality Date  . FINGER AMPUTATION  2010   right index  .  HEMORROIDECTOMY    . KNEE ARTHROSCOPY     bilateral, left x2  . TOTAL ABDOMINAL HYSTERECTOMY      Social History   Social History  . Marital status: Single    Spouse name: N/A  . Number of children: 0  . Years of education: 2   Occupational History  . produce clerk   .  Unemployed   Social History Main Topics  . Smoking status: Current Every Day Smoker    Types: Cigarettes  . Smokeless tobacco: Never Used     Comment: pt states she smokes 1-2 cigarettes/ day  . Alcohol use 0.6 oz/week    1 Shots of liquor per week  . Drug use: No  . Sexual activity: Not Asked   Other Topics Concern  . None   Social History Narrative  . None    Family History  Problem Relation Age of Onset  . Irritable bowel syndrome Mother   . Hypertension Mother   . Atrial fibrillation Mother   . Ovarian cancer Maternal Aunt   . Diabetes Father   . Heart attack Father   . Hypertension Father   . Hyperlipidemia Father   . Thyroid disease Sister   . Hyperlipidemia Sister   . Thyroid disease Brother         Review of Systems  Constitutional: Positive for fatigue. Negative for fever.  HENT: Negative for congestion.   Respiratory: Negative for cough, shortness of breath and wheezing.   Cardiovascular: Negative for chest pain, palpitations, claudication, leg swelling and PND.  Gastrointestinal: Negative for abdominal pain, blood in stool, constipation, diarrhea, melena, nausea and vomiting.  Genitourinary: Negative for dysuria, flank pain, frequency, urgency and vaginal discharge.  Musculoskeletal: Positive for back pain, joint pain and myalgias. Negative for falls, joint swelling and stiffness.       Muscle pain and weakness this morning localized to lower extremities. Chronic lower back pain managed by pain clinic with local and joint injection.  Neurological: Positive for weakness. Negative for sensory change, seizures and headaches.  Endo/Heme/Allergies: Negative.    Psychiatric/Behavioral: Negative for memory loss, substance abuse and suicidal ideas. The patient does not have insomnia.      Recent Results (from the past 2160 hour(s))  Urinalysis, Routine w reflex microscopic- may I&O cath if menses     Status: Abnormal   Collection Time: 08/08/15  3:14 AM  Result Value Ref Range   Color, Urine ORANGE (A) YELLOW    Comment: BIOCHEMICALS MAY BE AFFECTED BY COLOR   APPearance TURBID (A) CLEAR   Specific Gravity, Urine 1.026 1.005 - 1.030   pH 5.0 5.0 - 8.0   Glucose, UA NEGATIVE NEGATIVE mg/dL   Hgb urine dipstick LARGE (A) NEGATIVE   Bilirubin Urine MODERATE (A) NEGATIVE   Ketones, ur 40 (A) NEGATIVE mg/dL   Protein, ur >300 (A) NEGATIVE mg/dL   Nitrite POSITIVE (A) NEGATIVE   Leukocytes, UA LARGE (A)  NEGATIVE  Urine microscopic-add on     Status: Abnormal   Collection Time: 08/08/15  3:14 AM  Result Value Ref Range   Squamous Epithelial / LPF 0-5 (A) NONE SEEN   WBC, UA TOO NUMEROUS TO COUNT 0 - 5 WBC/hpf   RBC / HPF TOO NUMEROUS TO COUNT 0 - 5 RBC/hpf   Bacteria, UA MANY (A) NONE SEEN  Urine culture     Status: Abnormal   Collection Time: 08/08/15  3:14 AM  Result Value Ref Range   Specimen Description URINE, CLEAN CATCH    Special Requests NONE    Culture >=100,000 COLONIES/mL ESCHERICHIA COLI (A)    Report Status 08/10/2015 FINAL    Organism ID, Bacteria ESCHERICHIA COLI (A)       Susceptibility   Escherichia coli - MIC*    AMPICILLIN <=2 SENSITIVE Sensitive     CEFAZOLIN <=4 SENSITIVE Sensitive     CEFTRIAXONE <=1 SENSITIVE Sensitive     CIPROFLOXACIN <=0.25 SENSITIVE Sensitive     GENTAMICIN <=1 SENSITIVE Sensitive     IMIPENEM <=0.25 SENSITIVE Sensitive     NITROFURANTOIN <=16 SENSITIVE Sensitive     TRIMETH/SULFA <=20 SENSITIVE Sensitive     AMPICILLIN/SULBACTAM <=2 SENSITIVE Sensitive     PIP/TAZO <=4 SENSITIVE Sensitive     * >=100,000 COLONIES/mL ESCHERICHIA COLI  CBC w/Diff     Status: Abnormal   Collection Time:  10/30/15 12:12 PM  Result Value Ref Range   WBC 7.9 4.0 - 10.5 K/uL   RBC 3.69 (L) 3.87 - 5.11 Mil/uL   Hemoglobin 13.1 12.0 - 15.0 g/dL   HCT 37.9 36.0 - 46.0 %   MCV 102.8 (H) 78.0 - 100.0 fl   MCHC 34.5 30.0 - 36.0 g/dL   RDW 13.5 11.5 - 15.5 %   Platelets 268.0 150.0 - 400.0 K/uL   Neutrophils Relative % 71.6 43.0 - 77.0 %   Lymphocytes Relative 17.9 12.0 - 46.0 %   Monocytes Relative 8.6 3.0 - 12.0 %   Eosinophils Relative 1.4 0.0 - 5.0 %   Basophils Relative 0.5 0.0 - 3.0 %   Neutro Abs 5.6 1.4 - 7.7 K/uL   Lymphs Abs 1.4 0.7 - 4.0 K/uL   Monocytes Absolute 0.7 0.1 - 1.0 K/uL   Eosinophils Absolute 0.1 0.0 - 0.7 K/uL   Basophils Absolute 0.0 0.0 - 0.1 K/uL  Comprehensive metabolic panel     Status: Abnormal   Collection Time: 10/30/15 12:12 PM  Result Value Ref Range   Sodium 139 135 - 145 mEq/L   Potassium 4.5 3.5 - 5.1 mEq/L   Chloride 103 96 - 112 mEq/L   CO2 29 19 - 32 mEq/L   Glucose, Bld 142 (H) 70 - 99 mg/dL   BUN 15 6 - 23 mg/dL   Creatinine, Ser 0.62 0.40 - 1.20 mg/dL   Total Bilirubin 0.6 0.2 - 1.2 mg/dL   Alkaline Phosphatase 75 39 - 117 U/L   AST 66 (H) 0 - 37 U/L   ALT 59 (H) 0 - 35 U/L   Total Protein 7.0 6.0 - 8.3 g/dL   Albumin 3.7 3.5 - 5.2 g/dL   Calcium 8.8 8.4 - 10.5 mg/dL   GFR 106.31 >60.00 mL/min  Lipid Profile     Status: Abnormal   Collection Time: 10/30/15 12:12 PM  Result Value Ref Range   Cholesterol 315 (H) 0 - 200 mg/dL    Comment: ATP III Classification       Desirable:  <  200 mg/dL               Borderline High:  200 - 239 mg/dL          High:  > = 240 mg/dL   Triglycerides 140.0 0.0 - 149.0 mg/dL    Comment: Normal:  <150 mg/dLBorderline High:  150 - 199 mg/dL   HDL 126.70 >39.00 mg/dL   VLDL 28.0 0.0 - 40.0 mg/dL   LDL Cholesterol 160 (H) 0 - 99 mg/dL   Total CHOL/HDL Ratio 2     Comment:                Men          Women1/2 Average Risk     3.4          3.3Average Risk          5.0          4.42X Average Risk          9.6           7.13X Average Risk          15.0          11.0                       NonHDL 188.23     Comment: NOTE:  Non-HDL goal should be 30 mg/dL higher than patient's LDL goal (i.e. LDL goal of < 70 mg/dL, would have non-HDL goal of < 100 mg/dL)  Hemoglobin A1c     Status: Abnormal   Collection Time: 10/30/15 12:12 PM  Result Value Ref Range   Hgb A1c MFr Bld 6.7 (H) 4.6 - 6.5 %    Comment: Glycemic Control Guidelines for People with Diabetes:Non Diabetic:  <6%Goal of Therapy: <7%Additional Action Suggested:  >8%   Cortisol     Status: None   Collection Time: 10/30/15 12:12 PM  Result Value Ref Range   Cortisol, Plasma 1.9 ug/dL    Comment: AM:  4.3 - 22.4 ug/dLPM:  3.1 - 16.7 ug/dL  TSH     Status: None   Collection Time: 10/30/15 12:12 PM  Result Value Ref Range   TSH 2.70 0.35 - 4.50 uIU/mL  T4, free     Status: None   Collection Time: 10/30/15 12:12 PM  Result Value Ref Range   Free T4 0.99 0.60 - 1.60 ng/dL  Sed Rate (ESR)     Status: Abnormal   Collection Time: 10/30/15 12:12 PM  Result Value Ref Range   Sed Rate 68 (H) 0 - 30 mm/hr  Cardiac panel     Status: Abnormal   Collection Time: 10/30/15  1:00 PM  Result Value Ref Range   CK-MB 2.7 0.3 - 4.0 ng/mL   Relative Index 4.0 (H) 0.0 - 2.5 calc    Comment: Relative Index is invalid when CK is <100 U/L   Total CK 68 7 - 177 U/L   Objective:   Vitals:   10/30/15 1125  BP: 130/88  Pulse: 95  Temp: 97.7 F (36.5 C)    Body mass index is 31.62 kg/m.   Physical Examination:  Physical Exam  Constitutional: She is oriented to person, place, and time.  Cardiovascular: Normal rate and normal heart sounds.   Pulmonary/Chest: Effort normal and breath sounds normal.  Abdominal: Soft. Normal appearance. She exhibits no distension. There is no tenderness.  Musculoskeletal: She exhibits tenderness. She exhibits no edema.  Right hip: She exhibits tenderness and bony tenderness. She exhibits normal range of motion and normal  strength.       Left hip: She exhibits decreased range of motion, tenderness and bony tenderness. She exhibits normal strength, no swelling and no crepitus.       Right knee: Normal.       Left knee: Normal.       Right ankle: Normal.       Left ankle: Normal.       Lumbar back: She exhibits tenderness, bony tenderness and pain. She exhibits no swelling, no edema, no spasm and normal pulse.       Right upper leg: Normal.       Left upper leg: Normal.       Right lower leg: Normal.       Left lower leg: Normal.       Right foot: Normal.       Left foot: Normal.  Normal and equal microfilament sensation bilaterally.  Neurological: She is alert and oriented to person, place, and time. She has normal reflexes. Gait normal.  Skin: Skin is warm and dry.  Psychiatric: Affect and judgment normal.  Vitals reviewed.   ASSESSMENT and PLAN:  Diagnoses and all orders for this visit:  Myalgia -     CBC w/Diff; Future -     Cortisol; Future -     TSH; Future -     T4, free; Future -     CK Total (and CKMB); Future -     Sed Rate (ESR); Future  Leg weakness, bilateral -     CBC w/Diff; Future -     Comprehensive metabolic panel; Future -     Cortisol; Future -     TSH; Future -     T4, free; Future -     CK Total (and CKMB); Future -     Sed Rate (ESR); Future  Essential hypertension -     Comprehensive metabolic panel; Future  Hyperlipidemia -     Lipid Profile; Future  Anemia, unspecified anemia type -     CBC w/Diff; Future  Abnormal weight gain -     Hemoglobin A1c; Future -     Cortisol; Future -     TSH; Future -     T4, free; Future  Hyperglycemia -     Hemoglobin A1c; Future  BIPOLAR AFFECTIVE DISORDER  Gastroesophageal reflux disease without esophagitis    BIPOLAR AFFECTIVE DISORDER Managed by [facility]. They prescribe Prozac, Lamictal, and Abilify. Has upcoming appt in October 2017.   GERD (gastroesophageal reflux disease) Stable with use  of omeprazole  NAUSEA ALONE Controlled with phenergan prn Endoscopy done 2013 (normal)  Anemia Denies any sign of active bleeding. CBC ordered  Hypertension CMP ordered Controlled with Losartan/HCTZ     Follow up: Return in about 2 weeks (around 11/13/2015) for muscle ache and leg weakness.  Wilfred Lacy, NP

## 2015-10-30 NOTE — Assessment & Plan Note (Signed)
CMP ordered Controlled with Losartan/HCTZ

## 2015-10-30 NOTE — Telephone Encounter (Signed)
Pt stating she forgot tell Baldo Ash about her jaw, she said it is tight in the past 2 weeks,feels like it can lock up at times. [place] is concern about this. Please advise.

## 2015-10-31 ENCOUNTER — Ambulatory Visit (INDEPENDENT_AMBULATORY_CARE_PROVIDER_SITE_OTHER): Payer: Medicare Other | Admitting: Nurse Practitioner

## 2015-10-31 ENCOUNTER — Encounter: Payer: Self-pay | Admitting: Nurse Practitioner

## 2015-10-31 VITALS — BP 132/80 | HR 85 | Temp 98.3°F | Ht 65.0 in | Wt 191.5 lb

## 2015-10-31 DIAGNOSIS — R739 Hyperglycemia, unspecified: Secondary | ICD-10-CM | POA: Diagnosis not present

## 2015-10-31 DIAGNOSIS — R748 Abnormal levels of other serum enzymes: Secondary | ICD-10-CM

## 2015-10-31 DIAGNOSIS — M26609 Unspecified temporomandibular joint disorder, unspecified side: Secondary | ICD-10-CM | POA: Diagnosis not present

## 2015-10-31 DIAGNOSIS — E785 Hyperlipidemia, unspecified: Secondary | ICD-10-CM

## 2015-10-31 HISTORY — DX: Abnormal levels of other serum enzymes: R74.8

## 2015-10-31 MED ORDER — ATORVASTATIN CALCIUM 40 MG PO TABS: ORAL_TABLET | ORAL | 3 refills | Status: DC

## 2015-10-31 NOTE — Progress Notes (Signed)
Subjective:  Patient ID: [name] [name], female    DOB: April 07, 1960  Age: 55 y.o. MRN: [id number]  CC: Temporomandibular Joint Pain (Pt states sx of TMJ have been going on intermittently for about a week. )   [name] [name] presents for bilateral jaw pain x 1week. Will also like to review labs.  HPI Bilateral Jaw Pain: She presents with bilateral intermittent jaw pain, mild severity, denies pain during office visit, has no triggering factor, does not get better with anything in particular, no chest pain, no sore throat, no congestion, no ear pain, no teeth pain, no neck pain, no headache, no change in vision. No pain with chewing, no fever, no dizziness. Has not used any over-the-counter medication.  Hyperlipidermia: Has not been taking Lipitor as prescribed due to muscle aches. Has noticed that more so aches improved when not taking Lipitor. Denies any joint swelling or redness or stiffness.  Elevated LFTs: Hepatitis C screen in 2014 was negative. CT abdomen and pelvis in 2015 indicates moderate fatty lever. Patient reports hepatitis B vaccine in the 1980s. Denies any nausea, vomiting, change in bowel movements, or abdominal pain at this time. Admits to using additional Tylenol 500 mg 2-3 times a day plus Norco TID for pain. Admits to consuming alcohol at least once a week. Denies any signs of GI bleeding.  Outpatient Medications Prior to Visit  Medication Sig Dispense Refill  . alum & mag hydroxide-simeth (MAALOX/MYLANTA) 200-200-20 MG/5ML suspension Take 30 mLs by mouth every 6 (six) hours as needed for indigestion or heartburn (dyspepsia). 355 mL 0  . dexlansoprazole (DEXILANT) 60 MG capsule Take 60 mg by mouth daily.    . fluticasone (FLONASE) 50 MCG/ACT nasal spray Place 2 sprays into the nose daily.    Marland Kitchen HYDROcodone-acetaminophen (NORCO/VICODIN) 5-325 MG tablet Take 1 tablet by mouth every 6 (six) hours as needed for moderate pain. x3 daily PRN    . hydrocortisone (ANUSOL-HC) 2.5  % rectal cream Place 1 application rectally 2 (two) times daily. 30 g 1  . lamoTRIgine (LAMICTAL) 100 MG tablet Take 100 mg by mouth daily. Patient taking x2 daily    . losartan-hydrochlorothiazide (HYZAAR) 50-12.5 MG tablet Take 1 tablet by mouth every morning.  0  . Multiple Vitamin (MULTIVITAMIN WITH MINERALS) TABS tablet Take 1 tablet by mouth daily. 30 tablet 0  . ondansetron (ZOFRAN) 4 MG tablet Take 1 tablet (4 mg total) by mouth every 6 (six) hours. 12 tablet 0  . promethazine (PHENERGAN) 12.5 MG suppository Place 1 suppository (12.5 mg total) rectally every 4 (four) hours as needed for nausea. 30 each 0  . promethazine (PHENERGAN) 12.5 MG suppository Place 1 suppository (12.5 mg total) rectally every 6 (six) hours as needed for nausea or vomiting. 30 each 0  . ARIPiprazole (ABILIFY) 15 MG tablet Take 15 mg by mouth daily.      Marland Kitchen atorvastatin (LIPITOR) 40 MG tablet Take 40 mg by mouth daily. Once daily    . cephALEXin (KEFLEX) 500 MG capsule Take 2 capsules (1,000 mg total) by mouth 2 (two) times daily. 40 capsule 0  . FLUoxetine (PROZAC) 20 MG tablet Take 20 mg by mouth daily. Patient taking 58m daily     No facility-administered medications prior to visit.     ROS See HPI  Objective:  BP 132/80 (BP Location: Left Arm, Patient Position: Sitting, Cuff Size: Normal)   Pulse 85   Temp 98.3 F (36.8 C) (Oral)   Ht 5' 5" (1.651 m)  Wt 191 lb 8 oz (86.9 kg)   SpO2 96%   BMI 31.87 kg/m   BP Readings from Last 3 Encounters:  10/31/15 132/80  10/30/15 130/88  08/08/15 149/88    Wt Readings from Last 3 Encounters:  10/31/15 191 lb 8 oz (86.9 kg)  10/30/15 190 lb (86.2 kg)  04/28/15 183 lb 4 oz (83.1 kg)    Physical Exam  Constitutional: She is oriented to person, place, and time. No distress.  HENT:  Right Ear: Tympanic membrane, external ear and ear canal normal.  Left Ear: Tympanic membrane, external ear and ear canal normal.  Nose: Nose normal.  Mouth/Throat: Uvula  is midline, oropharynx is clear and moist and mucous membranes are normal. She does not have dentures. No oral lesions. No trismus in the jaw. No oropharyngeal exudate.  No parotid gland tenderness or swelling or facial erythema.  Eyes: No scleral icterus.  Neck: Normal range of motion. Neck supple. No thyromegaly present.  Cardiovascular: Normal rate.   Pulmonary/Chest: Effort normal. Stridor present.  Abdominal: Soft. Bowel sounds are normal. She exhibits no distension. There is no tenderness.  Musculoskeletal: She exhibits no edema.  Lymphadenopathy:    She has no cervical adenopathy.  Neurological: She is alert and oriented to person, place, and time.  Skin: Skin is warm and dry.  Vitals reviewed.    Recent Results (from the past 2160 hour(s))  Urinalysis, Routine w reflex microscopic- may I&O cath if menses     Status: Abnormal   Collection Time: 08/08/15  3:14 AM  Result Value Ref Range   Color, Urine ORANGE (A) YELLOW    Comment: BIOCHEMICALS MAY BE AFFECTED BY COLOR   APPearance TURBID (A) CLEAR   Specific Gravity, Urine 1.026 1.005 - 1.030   pH 5.0 5.0 - 8.0   Glucose, UA NEGATIVE NEGATIVE mg/dL   Hgb urine dipstick LARGE (A) NEGATIVE   Bilirubin Urine MODERATE (A) NEGATIVE   Ketones, ur 40 (A) NEGATIVE mg/dL   Protein, ur >300 (A) NEGATIVE mg/dL   Nitrite POSITIVE (A) NEGATIVE   Leukocytes, UA LARGE (A) NEGATIVE  Urine microscopic-add on     Status: Abnormal   Collection Time: 08/08/15  3:14 AM  Result Value Ref Range   Squamous Epithelial / LPF 0-5 (A) NONE SEEN   WBC, UA TOO NUMEROUS TO COUNT 0 - 5 WBC/hpf   RBC / HPF TOO NUMEROUS TO COUNT 0 - 5 RBC/hpf   Bacteria, UA MANY (A) NONE SEEN  Urine culture     Status: Abnormal   Collection Time: 08/08/15  3:14 AM  Result Value Ref Range   Specimen Description URINE, CLEAN CATCH    Special Requests NONE    Culture >=100,000 COLONIES/mL ESCHERICHIA COLI (A)    Report Status 08/10/2015 FINAL    Organism ID, Bacteria  ESCHERICHIA COLI (A)       Susceptibility   Escherichia coli - MIC*    AMPICILLIN <=2 SENSITIVE Sensitive     CEFAZOLIN <=4 SENSITIVE Sensitive     CEFTRIAXONE <=1 SENSITIVE Sensitive     CIPROFLOXACIN <=0.25 SENSITIVE Sensitive     GENTAMICIN <=1 SENSITIVE Sensitive     IMIPENEM <=0.25 SENSITIVE Sensitive     NITROFURANTOIN <=16 SENSITIVE Sensitive     TRIMETH/SULFA <=20 SENSITIVE Sensitive     AMPICILLIN/SULBACTAM <=2 SENSITIVE Sensitive     PIP/TAZO <=4 SENSITIVE Sensitive     * >=100,000 COLONIES/mL ESCHERICHIA COLI  CBC w/Diff     Status: Abnormal  Collection Time: 10/30/15 12:12 PM  Result Value Ref Range   WBC 7.9 4.0 - 10.5 K/uL   RBC 3.69 (L) 3.87 - 5.11 Mil/uL   Hemoglobin 13.1 12.0 - 15.0 g/dL   HCT 37.9 36.0 - 46.0 %   MCV 102.8 (H) 78.0 - 100.0 fl   MCHC 34.5 30.0 - 36.0 g/dL   RDW 13.5 11.5 - 15.5 %   Platelets 268.0 150.0 - 400.0 K/uL   Neutrophils Relative % 71.6 43.0 - 77.0 %   Lymphocytes Relative 17.9 12.0 - 46.0 %   Monocytes Relative 8.6 3.0 - 12.0 %   Eosinophils Relative 1.4 0.0 - 5.0 %   Basophils Relative 0.5 0.0 - 3.0 %   Neutro Abs 5.6 1.4 - 7.7 K/uL   Lymphs Abs 1.4 0.7 - 4.0 K/uL   Monocytes Absolute 0.7 0.1 - 1.0 K/uL   Eosinophils Absolute 0.1 0.0 - 0.7 K/uL   Basophils Absolute 0.0 0.0 - 0.1 K/uL  Comprehensive metabolic panel     Status: Abnormal   Collection Time: 10/30/15 12:12 PM  Result Value Ref Range   Sodium 139 135 - 145 mEq/L   Potassium 4.5 3.5 - 5.1 mEq/L   Chloride 103 96 - 112 mEq/L   CO2 29 19 - 32 mEq/L   Glucose, Bld 142 (H) 70 - 99 mg/dL   BUN 15 6 - 23 mg/dL   Creatinine, Ser 0.62 0.40 - 1.20 mg/dL   Total Bilirubin 0.6 0.2 - 1.2 mg/dL   Alkaline Phosphatase 75 39 - 117 U/L   AST 66 (H) 0 - 37 U/L   ALT 59 (H) 0 - 35 U/L   Total Protein 7.0 6.0 - 8.3 g/dL   Albumin 3.7 3.5 - 5.2 g/dL   Calcium 8.8 8.4 - 10.5 mg/dL   GFR 106.31 >60.00 mL/min  Lipid Profile     Status: Abnormal   Collection Time: 10/30/15 12:12 PM   Result Value Ref Range   Cholesterol 315 (H) 0 - 200 mg/dL    Comment: ATP III Classification       Desirable:  < 200 mg/dL               Borderline High:  200 - 239 mg/dL          High:  > = 240 mg/dL   Triglycerides 140.0 0.0 - 149.0 mg/dL    Comment: Normal:  <150 mg/dLBorderline High:  150 - 199 mg/dL   HDL 126.70 >39.00 mg/dL   VLDL 28.0 0.0 - 40.0 mg/dL   LDL Cholesterol 160 (H) 0 - 99 mg/dL   Total CHOL/HDL Ratio 2     Comment:                Men          Women1/2 Average Risk     3.4          3.3Average Risk          5.0          4.42X Average Risk          9.6          7.13X Average Risk          15.0          11.0                       NonHDL 188.23     Comment: NOTE:  Non-HDL goal should be  30 mg/dL higher than patient's LDL goal (i.e. LDL goal of < 70 mg/dL, would have non-HDL goal of < 100 mg/dL)  Hemoglobin A1c     Status: Abnormal   Collection Time: 10/30/15 12:12 PM  Result Value Ref Range   Hgb A1c MFr Bld 6.7 (H) 4.6 - 6.5 %    Comment: Glycemic Control Guidelines for People with Diabetes:Non Diabetic:  <6%Goal of Therapy: <7%Additional Action Suggested:  >8%   Cortisol     Status: None   Collection Time: 10/30/15 12:12 PM  Result Value Ref Range   Cortisol, Plasma 1.9 ug/dL    Comment: AM:  4.3 - 22.4 ug/dLPM:  3.1 - 16.7 ug/dL  TSH     Status: None   Collection Time: 10/30/15 12:12 PM  Result Value Ref Range   TSH 2.70 0.35 - 4.50 uIU/mL  T4, free     Status: None   Collection Time: 10/30/15 12:12 PM  Result Value Ref Range   Free T4 0.99 0.60 - 1.60 ng/dL  Sed Rate (ESR)     Status: Abnormal   Collection Time: 10/30/15 12:12 PM  Result Value Ref Range   Sed Rate 68 (H) 0 - 30 mm/hr  Cardiac panel     Status: Abnormal   Collection Time: 10/30/15  1:00 PM  Result Value Ref Range   CK-MB 2.7 0.3 - 4.0 ng/mL   Relative Index 4.0 (H) 0.0 - 2.5 calc    Comment: Relative Index is invalid when CK is <100 U/L   Total CK 68 7 - 177 U/L   No results  found.  Assessment & Plan:    [name] [name] was seen today for temporomandibular joint pain.  Diagnoses and all orders for this visit:  TMJ dysfunction  Hyperlipidemia -     atorvastatin (LIPITOR) 40 MG tablet; Take Monday, Wednesday and Friday at bedtime.  Hyperglycemia  Elevated liver enzymes   I have discontinued [name] [name]'s cephALEXin. I have also changed her atorvastatin. Additionally, I am having her maintain her lamoTRIgine, fluticasone, alum & mag hydroxide-simeth, multivitamin with minerals, losartan-hydrochlorothiazide, dexlansoprazole, hydrocortisone, promethazine, promethazine, ondansetron, HYDROcodone-acetaminophen, ARIPiprazole, and FLUoxetine.  Meds ordered this encounter  Medications  . ARIPiprazole (ABILIFY) 30 MG tablet  . FLUoxetine (PROZAC) 40 MG capsule  . atorvastatin (LIPITOR) 40 MG tablet    Sig: Take Monday, Wednesday and Friday at bedtime.    Dispense:  30 tablet    Refill:  3    Order Specific Question:   Supervising Provider    Answer:   Cassandria Anger [[id number]]    Follow-up: Return if symptoms worsen or fail to improve.  Wilfred Lacy, NP

## 2015-10-31 NOTE — Progress Notes (Signed)
Pre visit review using our clinic review tool, if applicable. No additional management support is needed unless otherwise documented below in the visit note. 

## 2015-10-31 NOTE — Progress Notes (Signed)
abnormal, discussed with patient during office visit 10/31/15. See naot

## 2015-10-31 NOTE — Assessment & Plan Note (Signed)
AST and ALT persistently elevated, but stable. Patient was previously evaluated by GI Hepatitis C screen in 2014 was negative. CT abdomen and pelvis in 2015 indicates moderate fatty lever. Patient reports hepatitis B vaccine in the 1980s. Repeat liver function tests in 6 months. Advised patient to avoid alcohol use, and limit Tylenol consumption to less than 3 g a day.

## 2015-10-31 NOTE — Assessment & Plan Note (Signed)
Take lipitor 3x/week to decrease muscle aches.

## 2015-10-31 NOTE — Assessment & Plan Note (Signed)
Encouraged low sugar/carb diet. Regular daily exercise. Repeat A1c in 3 months.

## 2015-10-31 NOTE — Patient Instructions (Addendum)
Avoid ETOH use due to elevated liver enzymes. Take lipitor as discussed. Avoid >3g tylenol per day.  Temporomandibular Joint Syndrome Temporomandibular joint (TMJ) syndrome is a condition that affects the joints between your jaw and your skull. The TMJs are located near your ears and allow your jaw to open and close. These joints and the nearby muscles are involved in all movements of the jaw. People with TMJ syndrome have pain in the area of these joints and muscles. Chewing, biting, or other movements of the jaw can be difficult or painful. TMJ syndrome can be caused by various things. In many cases, the condition is mild and goes away within a few weeks. For some people, the condition can become a long-term problem. CAUSES Possible causes of TMJ syndrome include:  Grinding your teeth or clenching your jaw. Some people do this when they are under stress.  Arthritis.  Injury to the jaw.  Head or neck injury.  Teeth or dentures that are not aligned well. In some cases, the cause of TMJ syndrome may not be known. SIGNS AND SYMPTOMS The most common symptom is an aching pain on the side of the head in the area of the TMJ. Other symptoms may include:  Pain when moving your jaw, such as when chewing or biting.  Being unable to open your jaw all the way.  Making a clicking sound when you open your mouth.  Headache.  Earache.  Neck or shoulder pain. DIAGNOSIS Diagnosis can usually be made based on your symptoms, your medical history, and a physical exam. Your health care provider may check the range of motion of your jaw. Imaging tests, such as X-rays or an MRI, are sometimes done. You may need to see your dentist to determine if your teeth and jaw are lined up correctly. TREATMENT TMJ syndrome often goes away on its own. If treatment is needed, the options may include:  Eating soft foods and applying ice or heat.  Medicines to relieve pain or inflammation.  Medicines to relax the  muscles.  A splint, bite plate, or mouthpiece to prevent teeth grinding or jaw clenching.  Relaxation techniques or counseling to help reduce stress.  Transcutaneous electrical nerve stimulation (TENS). This helps to relieve pain by applying an electrical current through the skin.  Acupuncture. This is sometimes helpful to relieve pain.  Jaw surgery. This is rarely needed. HOME CARE INSTRUCTIONS  Take medicines only as directed by your health care provider.  Eat a soft diet if you are having trouble chewing.  Apply ice to the painful area.  Put ice in a plastic bag.  Place a towel between your skin and the bag.  Leave the ice on for 20 minutes, 2-3 times a day.  Apply a warm compress to the painful area as directed.  Massage your jaw area and perform any jaw stretching exercises as recommended by your health care provider.  If you were given a mouthpiece or bite plate, wear it as directed.  Avoid foods that require a lot of chewing. Do not chew gum.  Keep all follow-up visits as directed by your health care provider. This is important. SEEK MEDICAL CARE IF:  You are having trouble eating.  You have new or worsening symptoms. SEEK IMMEDIATE MEDICAL CARE IF:  Your jaw locks open or closed.   This information is not intended to replace advice given to you by your health care provider. Make sure you discuss any questions you have with your health care provider.  Document Released: 11/03/2000 Document Revised: 03/01/2014 Document Reviewed: 09/13/2013 Elsevier Interactive Patient Education [employer].

## 2015-11-04 ENCOUNTER — Telehealth: Payer: Self-pay | Admitting: Nurse Practitioner

## 2015-11-04 ENCOUNTER — Telehealth: Payer: Self-pay

## 2015-11-04 NOTE — Telephone Encounter (Signed)
She was informed about her lab results during office visit 10/31/2015. The she has specific questions about her lab results?

## 2015-11-04 NOTE — Telephone Encounter (Signed)
Pt request to speak to the assistant concern about lab result. Please call her back

## 2015-11-04 NOTE — Telephone Encounter (Signed)
Please review cardiac panel and give remarks on results----patient is wanting to know if this lab is ok---routing to [place]---please advise, I will call patient back--thanks

## 2015-11-04 NOTE — Telephone Encounter (Signed)
error 

## 2015-11-05 NOTE — Telephone Encounter (Signed)
Patient is wanting to make sure that there has been no damage to her cardiac muscle----

## 2015-11-06 NOTE — Telephone Encounter (Signed)
Based on current labs, I have no reason to think her cardiac muscles may be damage.

## 2015-11-06 NOTE — Telephone Encounter (Signed)
Called and advised patient of charlottes comments, and I have mailed copy of labs to patient per patient request

## 2015-11-10 ENCOUNTER — Telehealth: Payer: Self-pay

## 2015-11-10 DIAGNOSIS — I10 Essential (primary) hypertension: Secondary | ICD-10-CM | POA: Diagnosis not present

## 2015-11-10 DIAGNOSIS — H5213 Myopia, bilateral: Secondary | ICD-10-CM | POA: Diagnosis not present

## 2015-11-10 DIAGNOSIS — E119 Type 2 diabetes mellitus without complications: Secondary | ICD-10-CM | POA: Diagnosis not present

## 2015-11-10 LAB — HM DIABETES EYE EXAM

## 2015-11-10 NOTE — Telephone Encounter (Signed)
Patient stated she was doing strenuous work, patient reports no chest pain, no shortness of breath, no dizziness or headache---patient advised to rest some, taking frequent breaks and no increased dosage of meds at this time---if symptoms present/worsen, either call our office back or go to ED

## 2015-11-10 NOTE — Telephone Encounter (Signed)
Patient called and said she is unsure what to do about her bp it was 148/102 then to 124/85 and now 138/74. She is wanting the nurse to call her back. She was wanting to know if she needs to take more medication or what to do. Please follow up Thank you.

## 2015-11-13 ENCOUNTER — Telehealth: Payer: Self-pay | Admitting: Nurse Practitioner

## 2015-11-13 NOTE — Telephone Encounter (Signed)
EASE NOTE: All timestamps contained within this report are represented as [place] Standard Time. CONFIDENTIALTY NOTICE: This fax transmission is intended only for the addressee. It contains information that is legally privileged, confidential or otherwise protected from use or disclosure. If you are not the intended recipient, you are strictly prohibited from reviewing, disclosing, copying using or disseminating any of this information or taking any action in reliance on or regarding this information. If you have received this fax in error, please notify us immediately by telephone so that we can arrange for its return to [place]. Phone: [phone], Toll-Free: [phone], Fax: [phone] Page: 1 of 1 Call Id: [id number] [facility] Client [place] Patient Name: [name] [name] DOB: 30-Sep-1960 Initial Comment Pt has questions about her BP 159/92.-- She wants [place] make sure [place] Nurse Assessment Nurse: Dimas Chyle, RN, Dellis Filbert Date/Time Eilene Ghazi Time): 11/13/2015 2:15:47 PM Confirm and document reason for call. If symptomatic, describe symptoms. You must click the next button to save text entered. ---Pt has questions about her BP 159/92.-- She wants to make sure [place] is [place]. On Losartan/HCTZ for B/P. Has the patient traveled out of the country within the last 30 days? ---No Does the patient have any new or worsening symptoms? ---Yes Will a triage be completed? ---Yes Related visit to physician within the last 2 weeks? ---No Does the PT have any chronic conditions? (i.e. diabetes, asthma, etc.) ---Yes List chronic conditions. ---HTN and history of seizure Is the patient pregnant or possibly pregnant? (Ask all females between the ages of 7-55) ---No Is this a behavioral health or substance abuse call? ---No Guidelines Guideline Title Affirmed  Question Affirmed Notes High Blood Pressure [1] BP # 140/90 AND [2] taking BP medications Final Disposition User See PCP within [place], RN, Dellis Filbert Comments 2nd call back to the nurse. Disagree/Comply: Comply

## 2015-11-14 ENCOUNTER — Ambulatory Visit: Payer: Medicare Other | Admitting: Nurse Practitioner

## 2015-11-17 ENCOUNTER — Ambulatory Visit: Payer: Medicare Other | Admitting: Nurse Practitioner

## 2015-11-18 ENCOUNTER — Ambulatory Visit: Payer: Medicare Other | Admitting: Nurse Practitioner

## 2015-11-19 ENCOUNTER — Telehealth: Payer: Self-pay | Admitting: Nurse Practitioner

## 2015-11-19 NOTE — Telephone Encounter (Signed)
Pt called in said that she is in [facility] and is to weak to home right now.  She wants to talk to nurse about her iron being low.  She wants to know what kind of iron supplements she needs to take?

## 2015-11-20 ENCOUNTER — Other Ambulatory Visit: Payer: Self-pay | Admitting: Internal Medicine

## 2015-11-20 NOTE — Telephone Encounter (Signed)
Pt called and she is back home. She is still weak and nauseous. Pt wants you to call her back so she knows what to take and what not to take. Thanks.

## 2015-11-20 NOTE — Telephone Encounter (Signed)
Pt states she has been working with her PCP and has been taking supplements. Pt thinks she is having nausea from the supplements and is requesting something for nausea. Instructed her to contact her PCP office as they have been working with her and the supplements. Pt verbalized understanding.

## 2015-11-21 NOTE — Telephone Encounter (Signed)
I can not treat patient over the phone.  She needs to go to local clinic or hospital for evaluation. When she returns to town, she needs to make an appt to be seen in office.  Thank you.

## 2015-11-21 NOTE — Telephone Encounter (Signed)
Pt called to check up on this. Pt stating she is not nauseous anymore but she is concern about her leg cramps. She is taking potassium & Magnesium for her leg already and she also want to know if she need to take medication for her unspecified anemia. Please call her back

## 2015-11-24 ENCOUNTER — Encounter: Payer: Self-pay | Admitting: Nurse Practitioner

## 2015-11-24 DIAGNOSIS — H35033 Hypertensive retinopathy, bilateral: Secondary | ICD-10-CM

## 2015-11-24 HISTORY — DX: Hypertensive retinopathy, bilateral: H35.033

## 2015-11-27 ENCOUNTER — Ambulatory Visit: Payer: Medicare Other | Admitting: Nurse Practitioner

## 2015-11-28 DIAGNOSIS — M5416 Radiculopathy, lumbar region: Secondary | ICD-10-CM | POA: Diagnosis not present

## 2015-11-28 DIAGNOSIS — M47817 Spondylosis without myelopathy or radiculopathy, lumbosacral region: Secondary | ICD-10-CM | POA: Diagnosis not present

## 2015-11-28 DIAGNOSIS — M791 Myalgia: Secondary | ICD-10-CM | POA: Diagnosis not present

## 2015-11-28 NOTE — Telephone Encounter (Signed)
Let message on patient's voicemail with charlottes instructions

## 2015-12-01 ENCOUNTER — Ambulatory Visit: Payer: Medicare Other | Admitting: Nurse Practitioner

## 2015-12-02 ENCOUNTER — Encounter: Payer: Self-pay | Admitting: Nurse Practitioner

## 2015-12-02 ENCOUNTER — Ambulatory Visit (INDEPENDENT_AMBULATORY_CARE_PROVIDER_SITE_OTHER): Payer: Medicare Other | Admitting: Nurse Practitioner

## 2015-12-02 ENCOUNTER — Telehealth: Payer: Self-pay | Admitting: Nurse Practitioner

## 2015-12-02 VITALS — BP 118/72 | HR 68 | Temp 98.1°F | Ht 65.0 in | Wt 195.0 lb

## 2015-12-02 DIAGNOSIS — M791 Myalgia, unspecified site: Secondary | ICD-10-CM

## 2015-12-02 DIAGNOSIS — M2550 Pain in unspecified joint: Secondary | ICD-10-CM

## 2015-12-02 DIAGNOSIS — E782 Mixed hyperlipidemia: Secondary | ICD-10-CM

## 2015-12-02 DIAGNOSIS — I8393 Asymptomatic varicose veins of bilateral lower extremities: Secondary | ICD-10-CM

## 2015-12-02 DIAGNOSIS — R2242 Localized swelling, mass and lump, left lower limb: Secondary | ICD-10-CM | POA: Diagnosis not present

## 2015-12-02 DIAGNOSIS — R29898 Other symptoms and signs involving the musculoskeletal system: Secondary | ICD-10-CM | POA: Diagnosis not present

## 2015-12-02 DIAGNOSIS — I10 Essential (primary) hypertension: Secondary | ICD-10-CM

## 2015-12-02 DIAGNOSIS — J309 Allergic rhinitis, unspecified: Secondary | ICD-10-CM

## 2015-12-02 HISTORY — DX: Pain in unspecified joint: M25.50

## 2015-12-02 HISTORY — DX: Asymptomatic varicose veins of bilateral lower extremities: I83.93

## 2015-12-02 MED ORDER — LOSARTAN POTASSIUM-HCTZ 50-12.5 MG PO TABS: 1.0000 | ORAL_TABLET | Freq: Every morning | ORAL | 4 refills | Status: DC

## 2015-12-02 MED ORDER — ATORVASTATIN CALCIUM 40 MG PO TABS: ORAL_TABLET | ORAL | 3 refills | Status: DC

## 2015-12-02 MED ORDER — FLUTICASONE PROPIONATE 50 MCG/ACT NA SUSP: 2.0000 | Freq: Every day | NASAL | 4 refills | Status: DC

## 2015-12-02 NOTE — Assessment & Plan Note (Signed)
Persistent muscle and joint pain. Mild elevation of ESR at 68. Normal cortisol, TSH and CK total Atorvastatin decreased to 3times a week. Referral to rheumatologist

## 2015-12-02 NOTE — Progress Notes (Signed)
Subjective:  Patient ID: [name] [name], female    DOB: 12-25-1960  Age: 55 y.o. MRN: [id number]  CC: Leg Pain (Pt stated vein bulgin/cramps on lower legs for about 3 weeks.) and Medication Refill (losartan/ HCTZ and atorvastatin)   Leg Pain   The incident occurred more than 1 week ago. There was no injury mechanism. The pain is present in the left thigh. Quality: increased sensitivity. The pain is moderate. The pain has been fluctuating since onset. Pertinent negatives include no inability to bear weight, loss of motion, loss of sensation, muscle weakness, numbness or tingling. She reports no foreign bodies present. The symptoms are aggravated by palpation. She has tried nothing for the symptoms.  Medication Refill  Pertinent negatives include no numbness.   Myalgia and Joint stiffness: She has noted some improvement with use of lipitor 3x times a week. But she notes persistent joint pain and stiffness, worse in morning and after exertion, it takes 13-26mins to get moving in morning. Denies any joint swellling or redness. No fever.    Outpatient Medications Prior to Visit  Medication Sig Dispense Refill  . alum & mag hydroxide-simeth (MAALOX/MYLANTA) 200-200-20 MG/5ML suspension Take 30 mLs by mouth every 6 (six) hours as needed for indigestion or heartburn (dyspepsia). 355 mL 0  . ARIPiprazole (ABILIFY) 30 MG tablet     . dexlansoprazole (DEXILANT) 60 MG capsule Take 60 mg by mouth daily.    Marland Kitchen FLUoxetine (PROZAC) 40 MG capsule     . HYDROcodone-acetaminophen (NORCO/VICODIN) 5-325 MG tablet Take 1 tablet by mouth every 6 (six) hours as needed for moderate pain. x3 daily PRN    . hydrocortisone (ANUSOL-HC) 2.5 % rectal cream Place 1 application rectally 2 (two) times daily. 30 g 1  . lamoTRIgine (LAMICTAL) 100 MG tablet Take 100 mg by mouth daily. Patient taking x2 daily    . Multiple Vitamin (MULTIVITAMIN WITH MINERALS) TABS tablet Take 1 tablet by mouth daily. 30 tablet 0  . PHENADOZ  12.5 MG suppository Unwrap and insert 1 suppository every 6 hours if needed for nausea 30 suppository 0  . promethazine (PHENERGAN) 12.5 MG suppository Place 1 suppository (12.5 mg total) rectally every 4 (four) hours as needed for nausea. 30 each 0  . atorvastatin (LIPITOR) 40 MG tablet Take Monday, Wednesday and Friday at bedtime. 30 tablet 3  . fluticasone (FLONASE) 50 MCG/ACT nasal spray Place 2 sprays into the nose daily.    Marland Kitchen losartan-hydrochlorothiazide (HYZAAR) 50-12.5 MG tablet Take 1 tablet by mouth every morning.  0  . ondansetron (ZOFRAN) 4 MG tablet Take 1 tablet (4 mg total) by mouth every 6 (six) hours. (Patient not taking: Reported on 12/02/2015) 12 tablet 0   No facility-administered medications prior to visit.     ROS See HPI  Objective:  BP 118/72 (BP Location: Left Arm, Patient Position: Sitting, Cuff Size: Normal)   Pulse 68   Temp 98.1 F (36.7 C)   Ht 5\' 5"  (1.651 m)   Wt 195 lb (88.5 kg)   SpO2 94%   BMI 32.45 kg/m   BP Readings from Last 3 Encounters:  12/02/15 118/72  10/31/15 132/80  10/30/15 130/88    Wt Readings from Last 3 Encounters:  12/02/15 195 lb (88.5 kg)  10/31/15 191 lb 8 oz (86.9 kg)  10/30/15 190 lb (86.2 kg)    Physical Exam  Constitutional: She is oriented to person, place, and time.  Cardiovascular: Normal rate.   Pulmonary/Chest: Effort normal.  Musculoskeletal: Normal  range of motion. She exhibits tenderness. She exhibits no edema.  Tender circular subcutaneous soft mass noted on left medial aspect of thigh.  Neurological: She is alert and oriented to person, place, and time.  Normal gait  Bulging varicose veins noted on bilateral LE extremities without any erythema or edema.  Skin: Skin is warm, dry and intact. No erythema.     Psychiatric: She has a normal mood and affect. Her behavior is normal.    Lab Results  Component Value Date   WBC 7.9 10/30/2015   HGB 13.1 10/30/2015   HCT 37.9 10/30/2015   PLT 268.0  10/30/2015   GLUCOSE 142 (H) 10/30/2015   CHOL 315 (H) 10/30/2015   TRIG 140.0 10/30/2015   HDL 126.70 10/30/2015   LDLDIRECT 192.5 06/08/2012   LDLCALC 160 (H) 10/30/2015   ALT 59 (H) 10/30/2015   AST 66 (H) 10/30/2015   NA 139 10/30/2015   K 4.5 10/30/2015   CL 103 10/30/2015   CREATININE 0.62 10/30/2015   BUN 15 10/30/2015   CO2 29 10/30/2015   TSH 2.70 10/30/2015   HGBA1C 6.7 (H) 10/30/2015    No results found.  Assessment & Plan:   [name] [name] was seen today for leg pain and medication refill.  Diagnoses and all orders for this visit:  Localized swelling, mass and lump, lower limb, left -     [place] Misc Soft Tissue; Future  Myalgia -     Ambulatory referral to Rheumatology  Leg weakness, bilateral -     Ambulatory referral to Rheumatology  Mixed hyperlipidemia -     atorvastatin (LIPITOR) 40 MG tablet; Take Monday, Wednesday and Friday at bedtime.  Essential hypertension -     losartan-hydrochlorothiazide (HYZAAR) 50-12.5 MG tablet; Take 1 tablet by mouth every morning.  Chronic allergic rhinitis, unspecified seasonality, unspecified trigger -     fluticasone (FLONASE) 50 MCG/ACT nasal spray; Place 2 sprays into both nostrils daily.  Varicose veins of both lower extremities   I have discontinued [name] [name]'s cefUROXime. I have also changed her fluticasone. Additionally, I am having her maintain her lamoTRIgine, alum & mag hydroxide-simeth, multivitamin with minerals, dexlansoprazole, hydrocortisone, promethazine, ondansetron, HYDROcodone-acetaminophen, ARIPiprazole, FLUoxetine, PHENADOZ, diclofenac sodium, losartan-hydrochlorothiazide, and atorvastatin.  Meds ordered this encounter  Medications  . diclofenac sodium (VOLTAREN) 1 % GEL  . DISCONTD: cefUROXime (CEFTIN) 500 MG tablet  . losartan-hydrochlorothiazide (HYZAAR) 50-12.5 MG tablet    Sig: Take 1 tablet by mouth every morning.    Dispense:  30 tablet    Refill:  4    Order Specific Question:   Supervising  Provider    Answer:   Cassandria Anger [[id number]]  . atorvastatin (LIPITOR) 40 MG tablet    Sig: Take Monday, Wednesday and Friday at bedtime.    Dispense:  30 tablet    Refill:  3    Order Specific Question:   Supervising Provider    Answer:   Cassandria Anger [[id number]]  . fluticasone (FLONASE) 50 MCG/ACT nasal spray    Sig: Place 2 sprays into both nostrils daily.    Dispense:  16 g    Refill:  4    Order Specific Question:   Supervising Provider    Answer:   Cassandria Anger [[id number]]    Follow-up: Return in about 5 months (around 05/01/2016) for HTN, Hyperlipidemia, myalgia.  Wilfred Lacy, NP

## 2015-12-02 NOTE — Telephone Encounter (Signed)
Pt needs her pharmacy changed to   [id number], [place]

## 2015-12-02 NOTE — Patient Instructions (Addendum)
You will be called with appt for mass on left medial aspect of thigh.  Varicose Veins Varicose veins are veins that have become enlarged and twisted. They are usually seen in the legs but can occur in other parts of the body as well. CAUSES This condition is the result of valves in the veins not working properly. Valves in the veins help to return blood from the leg to the heart. If these valves are damaged, blood flows backward and backs up into the veins in the leg near the skin. This causes the veins to become larger. RISK FACTORS People who are on their feet a lot, who are pregnant, or who are overweight are more likely to develop varicose veins. SIGNS AND SYMPTOMS  Bulging, twisted-appearing, bluish veins, most commonly found on the legs.  Leg pain or a feeling of heaviness. These symptoms may be worse at the end of the day.  Leg swelling.  Changes in skin color. DIAGNOSIS A health care provider can usually diagnose varicose veins by examining your legs. Your health care provider may also recommend an ultrasound of your leg veins. TREATMENT Most varicose veins can be treated at home.However, other treatments are available for people who have persistent symptoms or want to improve the cosmetic appearance of the varicose veins. These treatment options include:  Sclerotherapy. A solution is injected into the vein to close it off.  Laser treatment. A laser is used to heat the vein to close it off.  Radiofrequency vein ablation. An electrical current produced by radio waves is used to close off the vein.  Phlebectomy. The vein is surgically removed through small incisions made over the varicose vein.  Vein ligation and stripping. The vein is surgically removed through incisions made over the varicose vein after the vein has been tied (ligated). HOME CARE INSTRUCTIONS  Do not stand or sit in one position for long periods of time. Do not sit with your legs crossed. Rest with your legs  raised during the day.  Wear compression stockings as directed by your health care provider. These stockings help to prevent blood clots and reduce swelling in your legs.  Do not wear other tight, encircling garments around your legs, pelvis, or waist.  Walk as much as possible to increase blood flow.  Raise the foot of your bed at night with 2-inch blocks.  If you get a cut in the skin over the vein and the vein bleeds, lie down with your leg raised and press on it with a clean cloth until the bleeding stops. Then place a bandage (dressing) on the cut. See your health care provider if it continues to bleed. SEEK MEDICAL CARE IF:  The skin around your ankle starts to break down.  You have pain, redness, tenderness, or hard swelling in your leg over a vein.  You are uncomfortable because of leg pain.   This information is not intended to replace advice given to you by your health care provider. Make sure you discuss any questions you have with your health care provider.   Document Released: 11/18/2004 Document Revised: 03/01/2014 Document Reviewed: 06/26/2013 Elsevier Interactive Patient Education [employer].

## 2015-12-02 NOTE — Assessment & Plan Note (Signed)
Improved muscle and joint pain.  Maintain lipitor at 3x/week.

## 2015-12-02 NOTE — Telephone Encounter (Signed)
I have changed pharm per patient request

## 2015-12-02 NOTE — Progress Notes (Signed)
Pre visit review using our clinic review tool, if applicable. No additional management support is needed unless otherwise documented below in the visit note. 

## 2015-12-02 NOTE — Assessment & Plan Note (Signed)
Controlled with current medication

## 2015-12-02 NOTE — Assessment & Plan Note (Addendum)
In absence of pain amd edema; advised about use of compression stocking, and maintaining a low salt diet.

## 2015-12-10 ENCOUNTER — Other Ambulatory Visit: Payer: Self-pay | Admitting: Nurse Practitioner

## 2015-12-10 DIAGNOSIS — R2242 Localized swelling, mass and lump, left lower limb: Secondary | ICD-10-CM

## 2015-12-18 ENCOUNTER — Ambulatory Visit: Payer: Medicare Other | Admitting: Nurse Practitioner

## 2015-12-22 ENCOUNTER — Other Ambulatory Visit: Payer: Medicare Other

## 2015-12-23 ENCOUNTER — Other Ambulatory Visit: Payer: Medicare Other

## 2015-12-26 ENCOUNTER — Ambulatory Visit
Admission: RE | Admit: 2015-12-26 | Discharge: 2015-12-26 | Disposition: A | Discharge status: Home / Self Care | Payer: Medicare Other | Source: Ambulatory Visit | Attending: Nurse Practitioner | Admitting: Nurse Practitioner

## 2015-12-26 DIAGNOSIS — M47817 Spondylosis without myelopathy or radiculopathy, lumbosacral region: Secondary | ICD-10-CM | POA: Diagnosis not present

## 2015-12-26 DIAGNOSIS — M533 Sacrococcygeal disorders, not elsewhere classified: Secondary | ICD-10-CM | POA: Diagnosis not present

## 2015-12-26 DIAGNOSIS — M791 Myalgia: Secondary | ICD-10-CM | POA: Diagnosis not present

## 2015-12-26 DIAGNOSIS — R2242 Localized swelling, mass and lump, left lower limb: Secondary | ICD-10-CM

## 2015-12-26 DIAGNOSIS — M5416 Radiculopathy, lumbar region: Secondary | ICD-10-CM | POA: Diagnosis not present

## 2015-12-26 DIAGNOSIS — R928 Other abnormal and inconclusive findings on diagnostic imaging of breast: Secondary | ICD-10-CM | POA: Diagnosis not present

## 2015-12-29 DIAGNOSIS — F5104 Psychophysiologic insomnia: Secondary | ICD-10-CM | POA: Diagnosis not present

## 2015-12-29 DIAGNOSIS — M25562 Pain in left knee: Secondary | ICD-10-CM | POA: Diagnosis not present

## 2015-12-29 DIAGNOSIS — M25561 Pain in right knee: Secondary | ICD-10-CM | POA: Diagnosis not present

## 2015-12-29 DIAGNOSIS — M545 Low back pain: Secondary | ICD-10-CM | POA: Diagnosis not present

## 2015-12-29 DIAGNOSIS — F17210 Nicotine dependence, cigarettes, uncomplicated: Secondary | ICD-10-CM | POA: Diagnosis not present

## 2015-12-29 DIAGNOSIS — M25552 Pain in left hip: Secondary | ICD-10-CM | POA: Diagnosis not present

## 2015-12-29 DIAGNOSIS — M2550 Pain in unspecified joint: Secondary | ICD-10-CM | POA: Diagnosis not present

## 2015-12-29 DIAGNOSIS — M25551 Pain in right hip: Secondary | ICD-10-CM | POA: Diagnosis not present

## 2015-12-31 ENCOUNTER — Other Ambulatory Visit: Payer: Medicare Other

## 2016-01-07 ENCOUNTER — Encounter: Payer: Self-pay | Admitting: Nurse Practitioner

## 2016-01-26 DIAGNOSIS — M791 Myalgia: Secondary | ICD-10-CM | POA: Diagnosis not present

## 2016-01-26 DIAGNOSIS — M47817 Spondylosis without myelopathy or radiculopathy, lumbosacral region: Secondary | ICD-10-CM | POA: Diagnosis not present

## 2016-01-26 DIAGNOSIS — M5416 Radiculopathy, lumbar region: Secondary | ICD-10-CM | POA: Diagnosis not present

## 2016-01-26 DIAGNOSIS — M533 Sacrococcygeal disorders, not elsewhere classified: Secondary | ICD-10-CM | POA: Diagnosis not present

## 2016-02-24 DIAGNOSIS — M47817 Spondylosis without myelopathy or radiculopathy, lumbosacral region: Secondary | ICD-10-CM | POA: Diagnosis not present

## 2016-02-24 DIAGNOSIS — M791 Myalgia: Secondary | ICD-10-CM | POA: Diagnosis not present

## 2016-02-24 DIAGNOSIS — M5416 Radiculopathy, lumbar region: Secondary | ICD-10-CM | POA: Diagnosis not present

## 2016-02-24 DIAGNOSIS — M533 Sacrococcygeal disorders, not elsewhere classified: Secondary | ICD-10-CM | POA: Diagnosis not present

## 2016-03-22 DIAGNOSIS — M5416 Radiculopathy, lumbar region: Secondary | ICD-10-CM | POA: Diagnosis not present

## 2016-03-22 DIAGNOSIS — M791 Myalgia: Secondary | ICD-10-CM | POA: Diagnosis not present

## 2016-03-22 DIAGNOSIS — M47817 Spondylosis without myelopathy or radiculopathy, lumbosacral region: Secondary | ICD-10-CM | POA: Diagnosis not present

## 2016-03-22 DIAGNOSIS — M533 Sacrococcygeal disorders, not elsewhere classified: Secondary | ICD-10-CM | POA: Diagnosis not present

## 2016-04-07 ENCOUNTER — Other Ambulatory Visit: Payer: Self-pay | Admitting: *Deleted

## 2016-04-07 DIAGNOSIS — E782 Mixed hyperlipidemia: Secondary | ICD-10-CM

## 2016-04-07 DIAGNOSIS — I10 Essential (primary) hypertension: Secondary | ICD-10-CM

## 2016-04-07 MED ORDER — ATORVASTATIN CALCIUM 40 MG PO TABS: ORAL_TABLET | ORAL | 0 refills | Status: DC

## 2016-04-07 MED ORDER — LOSARTAN POTASSIUM-HCTZ 50-12.5 MG PO TABS: 1.0000 | ORAL_TABLET | Freq: Every morning | ORAL | 0 refills | Status: DC

## 2016-04-08 ENCOUNTER — Other Ambulatory Visit: Payer: Self-pay | Admitting: Nurse Practitioner

## 2016-04-08 DIAGNOSIS — I10 Essential (primary) hypertension: Secondary | ICD-10-CM

## 2016-04-15 ENCOUNTER — Telehealth: Payer: Self-pay | Admitting: Nurse Practitioner

## 2016-04-16 MED ORDER — PROMETHAZINE HCL 12.5 MG RE SUPP: 12.5000 mg | RECTAL | 0 refills | Status: DC | PRN

## 2016-04-16 NOTE — Telephone Encounter (Signed)
Refilled Phenergan

## 2016-04-17 ENCOUNTER — Other Ambulatory Visit: Payer: Self-pay | Admitting: Nurse Practitioner

## 2016-04-17 DIAGNOSIS — J309 Allergic rhinitis, unspecified: Secondary | ICD-10-CM

## 2016-04-20 DIAGNOSIS — Z79891 Long term (current) use of opiate analgesic: Secondary | ICD-10-CM | POA: Diagnosis not present

## 2016-04-20 DIAGNOSIS — G894 Chronic pain syndrome: Secondary | ICD-10-CM | POA: Diagnosis not present

## 2016-04-20 DIAGNOSIS — M791 Myalgia: Secondary | ICD-10-CM | POA: Diagnosis not present

## 2016-04-20 DIAGNOSIS — M5416 Radiculopathy, lumbar region: Secondary | ICD-10-CM | POA: Diagnosis not present

## 2016-04-20 DIAGNOSIS — M533 Sacrococcygeal disorders, not elsewhere classified: Secondary | ICD-10-CM | POA: Diagnosis not present

## 2016-04-20 DIAGNOSIS — M47817 Spondylosis without myelopathy or radiculopathy, lumbosacral region: Secondary | ICD-10-CM | POA: Diagnosis not present

## 2016-04-26 ENCOUNTER — Telehealth: Payer: Self-pay | Admitting: Nurse Practitioner

## 2016-04-26 ENCOUNTER — Ambulatory Visit: Payer: Medicare Other | Admitting: Nurse Practitioner

## 2016-04-26 NOTE — Telephone Encounter (Signed)
No charge. 

## 2016-04-30 ENCOUNTER — Other Ambulatory Visit: Payer: Self-pay | Admitting: Nurse Practitioner

## 2016-04-30 DIAGNOSIS — I10 Essential (primary) hypertension: Secondary | ICD-10-CM

## 2016-05-03 ENCOUNTER — Ambulatory Visit: Payer: Medicare Other | Admitting: Nurse Practitioner

## 2016-05-18 DIAGNOSIS — M5033 Other cervical disc degeneration, cervicothoracic region: Secondary | ICD-10-CM | POA: Diagnosis not present

## 2016-05-18 DIAGNOSIS — M25511 Pain in right shoulder: Secondary | ICD-10-CM | POA: Diagnosis not present

## 2016-05-18 DIAGNOSIS — M533 Sacrococcygeal disorders, not elsewhere classified: Secondary | ICD-10-CM | POA: Diagnosis not present

## 2016-05-18 DIAGNOSIS — M5134 Other intervertebral disc degeneration, thoracic region: Secondary | ICD-10-CM | POA: Diagnosis not present

## 2016-05-18 DIAGNOSIS — M791 Myalgia: Secondary | ICD-10-CM | POA: Diagnosis not present

## 2016-05-18 DIAGNOSIS — M47817 Spondylosis without myelopathy or radiculopathy, lumbosacral region: Secondary | ICD-10-CM | POA: Diagnosis not present

## 2016-05-18 DIAGNOSIS — M19011 Primary osteoarthritis, right shoulder: Secondary | ICD-10-CM | POA: Diagnosis not present

## 2016-05-18 DIAGNOSIS — M5031 Other cervical disc degeneration,  high cervical region: Secondary | ICD-10-CM | POA: Diagnosis not present

## 2016-05-18 DIAGNOSIS — M5416 Radiculopathy, lumbar region: Secondary | ICD-10-CM | POA: Diagnosis not present

## 2016-05-18 DIAGNOSIS — G5601 Carpal tunnel syndrome, right upper limb: Secondary | ICD-10-CM | POA: Diagnosis not present

## 2016-05-18 DIAGNOSIS — Z79891 Long term (current) use of opiate analgesic: Secondary | ICD-10-CM | POA: Diagnosis not present

## 2016-05-18 DIAGNOSIS — M542 Cervicalgia: Secondary | ICD-10-CM | POA: Diagnosis not present

## 2016-05-25 ENCOUNTER — Ambulatory Visit: Payer: Medicare Other | Admitting: Nurse Practitioner

## 2016-05-30 ENCOUNTER — Other Ambulatory Visit: Payer: Self-pay | Admitting: Nurse Practitioner

## 2016-05-30 DIAGNOSIS — I10 Essential (primary) hypertension: Secondary | ICD-10-CM

## 2016-06-04 ENCOUNTER — Other Ambulatory Visit: Payer: Self-pay | Admitting: Nurse Practitioner

## 2016-06-04 DIAGNOSIS — E782 Mixed hyperlipidemia: Secondary | ICD-10-CM

## 2016-06-08 DIAGNOSIS — F319 Bipolar disorder, unspecified: Secondary | ICD-10-CM | POA: Diagnosis not present

## 2016-06-11 ENCOUNTER — Telehealth: Payer: Self-pay | Admitting: Nurse Practitioner

## 2016-06-11 NOTE — Telephone Encounter (Signed)
Pt BP is lowering, would like to know if losartan-hydrochlorothiazide (HYZAAR) 50-12.5 MG tablet lowers BP or just regulates it?  Her resting BP is 89/58 Pulse 80 After medication BP is 138/78 Pulse was 100 Her BP is fluxuating.   Please call back

## 2016-06-15 NOTE — Telephone Encounter (Signed)
That is a BP lowering medication, hence if her BP is running that low, she needs to hold medication and make appt to be seen in office within the next 1week.

## 2016-06-16 NOTE — Telephone Encounter (Signed)
Pt stated her BP is back to normal now. Advise pt the next this happened to hold medication and call our office.

## 2016-06-23 DIAGNOSIS — M533 Sacrococcygeal disorders, not elsewhere classified: Secondary | ICD-10-CM | POA: Diagnosis not present

## 2016-06-23 DIAGNOSIS — M5135 Other intervertebral disc degeneration, thoracolumbar region: Secondary | ICD-10-CM | POA: Diagnosis not present

## 2016-06-23 DIAGNOSIS — M5031 Other cervical disc degeneration,  high cervical region: Secondary | ICD-10-CM | POA: Diagnosis not present

## 2016-06-23 DIAGNOSIS — Z79891 Long term (current) use of opiate analgesic: Secondary | ICD-10-CM | POA: Diagnosis not present

## 2016-06-23 DIAGNOSIS — M546 Pain in thoracic spine: Secondary | ICD-10-CM | POA: Diagnosis not present

## 2016-06-23 DIAGNOSIS — M47817 Spondylosis without myelopathy or radiculopathy, lumbosacral region: Secondary | ICD-10-CM | POA: Diagnosis not present

## 2016-06-23 DIAGNOSIS — M5137 Other intervertebral disc degeneration, lumbosacral region: Secondary | ICD-10-CM | POA: Diagnosis not present

## 2016-06-23 DIAGNOSIS — M791 Myalgia: Secondary | ICD-10-CM | POA: Diagnosis not present

## 2016-06-23 DIAGNOSIS — M542 Cervicalgia: Secondary | ICD-10-CM | POA: Diagnosis not present

## 2016-06-23 DIAGNOSIS — M5441 Lumbago with sciatica, right side: Secondary | ICD-10-CM | POA: Diagnosis not present

## 2016-06-23 DIAGNOSIS — M5416 Radiculopathy, lumbar region: Secondary | ICD-10-CM | POA: Diagnosis not present

## 2016-06-23 DIAGNOSIS — M5033 Other cervical disc degeneration, cervicothoracic region: Secondary | ICD-10-CM | POA: Diagnosis not present

## 2016-06-24 ENCOUNTER — Ambulatory Visit (INDEPENDENT_AMBULATORY_CARE_PROVIDER_SITE_OTHER): Payer: PPO | Admitting: Nurse Practitioner

## 2016-06-24 ENCOUNTER — Encounter: Payer: Self-pay | Admitting: Nurse Practitioner

## 2016-06-24 ENCOUNTER — Telehealth: Payer: Self-pay | Admitting: Nurse Practitioner

## 2016-06-24 ENCOUNTER — Other Ambulatory Visit (INDEPENDENT_AMBULATORY_CARE_PROVIDER_SITE_OTHER): Payer: PPO

## 2016-06-24 VITALS — BP 120/66 | HR 85 | Temp 98.4°F | Ht 65.0 in | Wt 193.0 lb

## 2016-06-24 DIAGNOSIS — E782 Mixed hyperlipidemia: Secondary | ICD-10-CM

## 2016-06-24 DIAGNOSIS — I10 Essential (primary) hypertension: Secondary | ICD-10-CM

## 2016-06-24 DIAGNOSIS — K219 Gastro-esophageal reflux disease without esophagitis: Secondary | ICD-10-CM | POA: Diagnosis not present

## 2016-06-24 DIAGNOSIS — R739 Hyperglycemia, unspecified: Secondary | ICD-10-CM

## 2016-06-24 LAB — LIPID PANEL
CHOLESTEROL: 308 mg/dL — AB (ref 0–200)
HDL: 135.1 mg/dL (ref >39.00)
LDL Cholesterol: 152 mg/dL — ABNORMAL HIGH (ref 0–99)
NonHDL: 172.57
Total CHOL/HDL Ratio: 2
Triglycerides: 104 mg/dL (ref 0.0–149.0)
VLDL: 20.8 mg/dL (ref 0.0–40.0)

## 2016-06-24 LAB — BASIC METABOLIC PANEL
BUN: 19 mg/dL (ref 6–23)
CALCIUM: 10.2 mg/dL (ref 8.4–10.5)
CO2: 29 mEq/L (ref 19–32)
CREATININE: 0.97 mg/dL (ref 0.40–1.20)
Chloride: 97 mEq/L (ref 96–112)
GFR: 63.27 mL/min (ref >60.00)
Glucose, Bld: 162 mg/dL — ABNORMAL HIGH (ref 70–99)
Potassium: 4.8 mEq/L (ref 3.5–5.1)
Sodium: 136 mEq/L (ref 135–145)

## 2016-06-24 LAB — HEMOGLOBIN A1C: Hgb A1c MFr Bld: 5.9 % (ref 4.6–6.5)

## 2016-06-24 MED ORDER — DEXLANSOPRAZOLE 30 MG PO CPDR: 30.0000 mg | DELAYED_RELEASE_CAPSULE | Freq: Every day | ORAL | 1 refills | Status: DC

## 2016-06-24 MED ORDER — LOSARTAN POTASSIUM 50 MG PO TABS: 50.0000 mg | ORAL_TABLET | Freq: Every day | ORAL | 1 refills | Status: DC

## 2016-06-24 MED ORDER — ATORVASTATIN CALCIUM 40 MG PO TABS: ORAL_TABLET | ORAL | 1 refills | Status: DC

## 2016-06-24 NOTE — Telephone Encounter (Signed)
Pt called for results please call back  °

## 2016-06-24 NOTE — Progress Notes (Signed)
Subjective:  Patient ID: [name] [name], female    DOB: May 20, 1960  Age: 56 y.o. MRN: [id number]  CC: Follow-up (5 mo fu/ Low BP consult---dexalant consult? spot on left leg,under righ arm--getting biger?)   HPI  GERD: Needs dexilant refill. No improvement with omeprazole 40mg .  Hyperlipidemia: Improvement of muscle aches with 3x/week dosing of atorvastatin.  HTN: Report home BP reading on 100s/50s, hence stopped taking losartan/HCTZ for the last 1week. Took a dose 2days ago when BP increased to 130/70.  Outpatient Medications Prior to Visit  Medication Sig Dispense Refill  . alum & mag hydroxide-simeth (MAALOX/MYLANTA) 200-200-20 MG/5ML suspension Take 30 mLs by mouth every 6 (six) hours as needed for indigestion or heartburn (dyspepsia). 355 mL 0  . ARIPiprazole (ABILIFY) 30 MG tablet     . diclofenac sodium (VOLTAREN) 1 % GEL     . FLUoxetine (PROZAC) 40 MG capsule     . fluticasone (FLONASE) 50 MCG/ACT nasal spray instill 2 sprays into each nostril once daily 16 g 2  . HYDROcodone-acetaminophen (NORCO/VICODIN) 5-325 MG tablet Take 1 tablet by mouth every 6 (six) hours as needed for moderate pain. x3 daily PRN    . hydrocortisone (ANUSOL-HC) 2.5 % rectal cream Place 1 application rectally 2 (two) times daily. 30 g 1  . lamoTRIgine (LAMICTAL) 100 MG tablet Take 100 mg by mouth daily. Patient taking x2 daily    . Multiple Vitamin (MULTIVITAMIN WITH MINERALS) TABS tablet Take 1 tablet by mouth daily. 30 tablet 0  . ondansetron (ZOFRAN) 4 MG tablet Take 1 tablet (4 mg total) by mouth every 6 (six) hours. 12 tablet 0  . PHENADOZ 12.5 MG suppository Unwrap and insert 1 suppository every 6 hours if needed for nausea 30 suppository 0  . promethazine (PHENERGAN) 12.5 MG suppository Place 1 suppository (12.5 mg total) rectally every 4 (four) hours as needed for nausea. 30 each 0  . atorvastatin (LIPITOR) 40 MG tablet take 1 tablet by mouth at bedtime ON MONDAY, WEDNESDAY AND FRIDAY 30  tablet 0  . dexlansoprazole (DEXILANT) 60 MG capsule Take 60 mg by mouth daily.    Marland Kitchen losartan-hydrochlorothiazide (HYZAAR) 50-12.5 MG tablet take 1 tablet by mouth every morning 30 tablet 0   No facility-administered medications prior to visit.     ROS Review of Systems  Constitutional: Negative.   Eyes: Negative for blurred vision.  Respiratory: Negative for cough.   Cardiovascular: Negative for chest pain, palpitations, orthopnea, leg swelling and PND.  Skin: Negative.   Neurological: Negative for dizziness and headaches.    Objective:  BP 120/66   Pulse 85   Temp 98.4 F (36.9 C)   Ht 5\' 5"  (1.651 m)   Wt 193 lb (87.5 kg)   SpO2 98%   BMI 32.12 kg/m   BP Readings from Last 3 Encounters:  06/24/16 120/66  12/02/15 118/72  10/31/15 132/80    Wt Readings from Last 3 Encounters:  06/24/16 193 lb (87.5 kg)  12/02/15 195 lb (88.5 kg)  10/31/15 191 lb 8 oz (86.9 kg)    Physical Exam  Constitutional: She is oriented to person, place, and time. No distress.  Neck: Normal range of motion. Neck supple.  Cardiovascular: Normal rate, regular rhythm and normal heart sounds.   Pulmonary/Chest: Effort normal.  Abdominal: Soft. Bowel sounds are normal.  Musculoskeletal: She exhibits no edema or tenderness.  Lymphadenopathy:    She has no cervical adenopathy.  Neurological: She is alert and oriented to person, place, and  time.  Skin: Skin is warm and dry.  Vitals reviewed.   Lab Results  Component Value Date   WBC 7.9 10/30/2015   HGB 13.1 10/30/2015   HCT 37.9 10/30/2015   PLT 268.0 10/30/2015   GLUCOSE 142 (H) 10/30/2015   CHOL 315 (H) 10/30/2015   TRIG 140.0 10/30/2015   HDL 126.70 10/30/2015   LDLDIRECT 192.5 06/08/2012   LDLCALC 160 (H) 10/30/2015   ALT 59 (H) 10/30/2015   AST 66 (H) 10/30/2015   NA 139 10/30/2015   K 4.5 10/30/2015   CL 103 10/30/2015   CREATININE 0.62 10/30/2015   BUN 15 10/30/2015   CO2 29 10/30/2015   TSH 2.70 10/30/2015   HGBA1C  6.7 (H) 10/30/2015    [place] Extrem Low Left Ltd  Result Date: 12/26/2015 CLINICAL DATA:  Left medial palpable abnormality. EXAM: ULTRASOUND left LOWER EXTREMITY LIMITED TECHNIQUE: Ultrasound examination of the lower extremity soft tissues was performed in the area of clinical concern. COMPARISON:  None FINDINGS: Sonographic evaluation in the area of clinical concern reveals no definitive mass or fluid collection. The palpable abnormality may represent a focal confluent area of subcutaneous fat. IMPRESSION: No definitive abnormality is noted to correspond with the palpable abnormality. Electronically Signed   By: [name] [name] M.D.   On: 12/26/2015 13:43    Assessment & Plan:   [name] [name] was seen today for follow-up.  Diagnoses and all orders for this visit:  Essential hypertension -     losartan (COZAAR) 50 MG tablet; Take 1 tablet (50 mg total) by mouth daily. -     Basic metabolic panel; Future  Gastroesophageal reflux disease without esophagitis -     Dexlansoprazole 30 MG capsule; Take 1 capsule (30 mg total) by mouth daily.  Mixed hyperlipidemia -     atorvastatin (LIPITOR) 40 MG tablet; take 1 tablet by mouth at bedtime ON MONDAY, WEDNESDAY AND FRIDAY -     Lipid panel; Future  Hyperglycemia -     Hemoglobin A1c; Future   I have discontinued Ms. [name] [name]'s dexlansoprazole and losartan-hydrochlorothiazide. I am also having her start on losartan and Dexlansoprazole. Additionally, I am having her maintain her lamoTRIgine, alum & mag hydroxide-simeth, multivitamin with minerals, hydrocortisone, ondansetron, HYDROcodone-acetaminophen, ARIPiprazole, FLUoxetine, PHENADOZ, diclofenac sodium, promethazine, fluticasone, hydrOXYzine, methocarbamol, and atorvastatin.  Meds ordered this encounter  Medications  . hydrOXYzine (VISTARIL) 25 MG capsule    Sig: Take 25 mg by mouth 2 (two) times daily.    Refill:  0  . methocarbamol (ROBAXIN) 500 MG tablet    Sig: Take 500 mg by mouth 2 (two) times  daily.  Marland Kitchen losartan (COZAAR) 50 MG tablet    Sig: Take 1 tablet (50 mg total) by mouth daily.    Dispense:  90 tablet    Refill:  1    Order Specific Question:   Supervising Provider    Answer:   [name] [name] [[id number]]  . atorvastatin (LIPITOR) 40 MG tablet    Sig: take 1 tablet by mouth at bedtime ON MONDAY, WEDNESDAY AND FRIDAY    Dispense:  90 tablet    Refill:  1    Order Specific Question:   Supervising Provider    Answer:   [name] [name] [[id number]]  . Dexlansoprazole 30 MG capsule    Sig: Take 1 capsule (30 mg total) by mouth daily.    Dispense:  90 capsule    Refill:  1    Order Specific Question:   Supervising Provider  Answer:   [name] [name] [[id number]]    Follow-up: Return in about 3 months (around 09/24/2016) for CPE.  Wilfred Lacy, NP

## 2016-06-24 NOTE — Progress Notes (Signed)
Pre visit review using our clinic review tool, if applicable. No additional management support is needed unless otherwise documented below in the visit note. 

## 2016-06-24 NOTE — Patient Instructions (Addendum)
Schedule for CPE in 1-37months.  Go to basement for blood draw.

## 2016-07-29 ENCOUNTER — Other Ambulatory Visit: Payer: Self-pay | Admitting: Nurse Practitioner

## 2016-07-29 DIAGNOSIS — J309 Allergic rhinitis, unspecified: Secondary | ICD-10-CM

## 2016-07-29 DIAGNOSIS — I10 Essential (primary) hypertension: Secondary | ICD-10-CM

## 2016-07-29 NOTE — Telephone Encounter (Signed)
rx sent in for Flonase but the losartan HCTZ was denied, [place] changed to Losartan 50mg  on 06/24/2016.

## 2016-08-03 DIAGNOSIS — M5441 Lumbago with sciatica, right side: Secondary | ICD-10-CM | POA: Diagnosis not present

## 2016-08-03 DIAGNOSIS — M5137 Other intervertebral disc degeneration, lumbosacral region: Secondary | ICD-10-CM | POA: Diagnosis not present

## 2016-08-03 DIAGNOSIS — M5135 Other intervertebral disc degeneration, thoracolumbar region: Secondary | ICD-10-CM | POA: Diagnosis not present

## 2016-08-03 DIAGNOSIS — M5031 Other cervical disc degeneration,  high cervical region: Secondary | ICD-10-CM | POA: Diagnosis not present

## 2016-08-03 DIAGNOSIS — M5416 Radiculopathy, lumbar region: Secondary | ICD-10-CM | POA: Diagnosis not present

## 2016-08-03 DIAGNOSIS — M533 Sacrococcygeal disorders, not elsewhere classified: Secondary | ICD-10-CM | POA: Diagnosis not present

## 2016-08-03 DIAGNOSIS — M47817 Spondylosis without myelopathy or radiculopathy, lumbosacral region: Secondary | ICD-10-CM | POA: Diagnosis not present

## 2016-08-03 DIAGNOSIS — M542 Cervicalgia: Secondary | ICD-10-CM | POA: Diagnosis not present

## 2016-08-03 DIAGNOSIS — Z79891 Long term (current) use of opiate analgesic: Secondary | ICD-10-CM | POA: Diagnosis not present

## 2016-08-03 DIAGNOSIS — M791 Myalgia: Secondary | ICD-10-CM | POA: Diagnosis not present

## 2016-08-03 DIAGNOSIS — M546 Pain in thoracic spine: Secondary | ICD-10-CM | POA: Diagnosis not present

## 2016-08-03 DIAGNOSIS — M5033 Other cervical disc degeneration, cervicothoracic region: Secondary | ICD-10-CM | POA: Diagnosis not present

## 2016-08-22 ENCOUNTER — Other Ambulatory Visit: Payer: Self-pay | Admitting: Internal Medicine

## 2016-08-27 ENCOUNTER — Telehealth: Payer: Self-pay

## 2016-08-27 NOTE — Telephone Encounter (Signed)
PA Started [id number]

## 2016-08-30 ENCOUNTER — Encounter: Payer: PPO | Admitting: Nurse Practitioner

## 2016-08-30 NOTE — Telephone Encounter (Signed)
Notified pharmacy with response of PA. Rep stated pt already got the medication. Send paper to scan.

## 2016-09-07 ENCOUNTER — Encounter: Payer: PPO | Admitting: Nurse Practitioner

## 2016-09-07 DIAGNOSIS — M5033 Other cervical disc degeneration, cervicothoracic region: Secondary | ICD-10-CM | POA: Diagnosis not present

## 2016-09-07 DIAGNOSIS — Z79891 Long term (current) use of opiate analgesic: Secondary | ICD-10-CM | POA: Diagnosis not present

## 2016-09-07 DIAGNOSIS — M5441 Lumbago with sciatica, right side: Secondary | ICD-10-CM | POA: Diagnosis not present

## 2016-09-07 DIAGNOSIS — M791 Myalgia: Secondary | ICD-10-CM | POA: Diagnosis not present

## 2016-09-07 DIAGNOSIS — M5031 Other cervical disc degeneration,  high cervical region: Secondary | ICD-10-CM | POA: Diagnosis not present

## 2016-09-07 DIAGNOSIS — M5135 Other intervertebral disc degeneration, thoracolumbar region: Secondary | ICD-10-CM | POA: Diagnosis not present

## 2016-09-07 DIAGNOSIS — M5416 Radiculopathy, lumbar region: Secondary | ICD-10-CM | POA: Diagnosis not present

## 2016-09-07 DIAGNOSIS — M533 Sacrococcygeal disorders, not elsewhere classified: Secondary | ICD-10-CM | POA: Diagnosis not present

## 2016-09-07 DIAGNOSIS — M542 Cervicalgia: Secondary | ICD-10-CM | POA: Diagnosis not present

## 2016-09-07 DIAGNOSIS — M5137 Other intervertebral disc degeneration, lumbosacral region: Secondary | ICD-10-CM | POA: Diagnosis not present

## 2016-09-07 DIAGNOSIS — M546 Pain in thoracic spine: Secondary | ICD-10-CM | POA: Diagnosis not present

## 2016-09-07 DIAGNOSIS — M47817 Spondylosis without myelopathy or radiculopathy, lumbosacral region: Secondary | ICD-10-CM | POA: Diagnosis not present

## 2016-09-09 ENCOUNTER — Encounter: Payer: Self-pay | Admitting: Nurse Practitioner

## 2016-09-09 ENCOUNTER — Ambulatory Visit (INDEPENDENT_AMBULATORY_CARE_PROVIDER_SITE_OTHER): Payer: PPO | Admitting: Nurse Practitioner

## 2016-09-09 VITALS — BP 110/70 | HR 77 | Temp 98.9°F | Ht 65.0 in | Wt 197.0 lb

## 2016-09-09 DIAGNOSIS — J309 Allergic rhinitis, unspecified: Secondary | ICD-10-CM | POA: Diagnosis not present

## 2016-09-09 DIAGNOSIS — Z0000 Encounter for general adult medical examination without abnormal findings: Secondary | ICD-10-CM

## 2016-09-09 DIAGNOSIS — E782 Mixed hyperlipidemia: Secondary | ICD-10-CM

## 2016-09-09 DIAGNOSIS — I10 Essential (primary) hypertension: Secondary | ICD-10-CM | POA: Diagnosis not present

## 2016-09-09 DIAGNOSIS — K219 Gastro-esophageal reflux disease without esophagitis: Secondary | ICD-10-CM | POA: Diagnosis not present

## 2016-09-09 DIAGNOSIS — F17200 Nicotine dependence, unspecified, uncomplicated: Secondary | ICD-10-CM

## 2016-09-09 MED ORDER — FLUTICASONE PROPIONATE 50 MCG/ACT NA SUSP: NASAL | 5 refills | Status: DC

## 2016-09-09 MED ORDER — LOSARTAN POTASSIUM 50 MG PO TABS: 50.0000 mg | ORAL_TABLET | Freq: Every day | ORAL | 2 refills | Status: DC

## 2016-09-09 MED ORDER — ATORVASTATIN CALCIUM 40 MG PO TABS: ORAL_TABLET | ORAL | 2 refills | Status: DC

## 2016-09-09 MED ORDER — DEXLANSOPRAZOLE 30 MG PO CPDR: 30.0000 mg | DELAYED_RELEASE_CAPSULE | Freq: Every day | ORAL | 2 refills | Status: DC

## 2016-09-09 NOTE — Progress Notes (Signed)
Subjective:    Patient ID: [name] [name], female    DOB: [birth year], 56 y.o.   MRN: [id number]  Patient presents today for complete physical  HPI  HTN: controlled with losartan BP Readings from Last 3 Encounters:  09/09/16 110/70  06/24/16 120/66  12/02/15 118/72   Gerd: Controlled with dexilant.  Hyperlipidemia: No adverse effects with lipitor. Lipid Panel     Component Value Date/Time   CHOL 308 (H) 06/24/2016 1045   TRIG 104.0 06/24/2016 1045   HDL 135.10 06/24/2016 1045   CHOLHDL 2 06/24/2016 1045   VLDL 20.8 06/24/2016 1045   LDLCALC 152 (H) 06/24/2016 1045   LDLDIRECT 192.5 06/08/2012 1106   Tobacco use: 1ppd x 89yrs. Thinking about quitting due to persistent cough. No quit date at this time. Never tried to quit.  Immunizations: (TDAP, Hep C screen, Pneumovax, Influenza, zoster)  Health Maintenance  Topic Date Due  . Tetanus Vaccine  01/24/1980  . Mammogram  01/24/2011  . HIV Screening  08/22/2017*  . Flu Shot  09/22/2016  . Colon Cancer Screening  04/11/2021  .  Hepatitis C: One time screening is recommended by Center for Disease Control  (CDC) for  adults born from [birth year] through 1965.   Completed  . Pap Smear  Excluded  *Topic was postponed. The date shown is not the original due date.   Diet:heart healthy.  Weight:  Wt Readings from Last 3 Encounters:  09/09/16 197 lb (89.4 kg)  06/24/16 193 lb (87.5 kg)  12/02/15 195 lb (88.5 kg)    Exercise:walking daily.  Fall Risk: Fall Risk  06/24/2016  Falls in the past year? No   Home Safety:home with partner.  Depression/Suicide: Depression screen [facility] 2/9 06/24/2016  Decreased Interest 0  Down, Depressed, Hopeless 0  PHQ - 2 Score 0   Pap Smear (every 47yrs for >21-29 without HPV, every 26yrs for >30-61yrs with HPV): s/p hysterectomy due to uterine fibroids.  Hx of ovarian cyst. Ovaries and uterus removed.  Mammogram (yearly, >76yrs):last done 2years ago per patient, normal per patient. Done by [facility], patient will schedule repeat.  Vision; up to date, no glaucoma, left cataracts.  Dental:up to date.  Advanced Directive: Advanced Directives 08/08/2015  Does Patient Have a Medical Advance Directive? No  Would patient like information on creating a medical advance directive? No - patient declined information  Pre-existing out of facility DNR order (yellow form or pink MOST form) -   Sexual History (birth control, marital status, STD): same sex partner, sexually active  Medications and allergies reviewed with patient and updated if appropriate.  Patient Active Problem List   Diagnosis Date Noted  . Polyarthralgia 12/02/2015  . Varicose veins of both lower extremities 12/02/2015  . Hypertensive retinopathy of both eyes 11/24/2015  . Elevated liver enzymes 10/31/2015  . Hyperglycemia 10/31/2015  . SIRS (systemic inflammatory response syndrome) ([place]) 03/11/2013  . Hypertension   . Hyperlipidemia   . Anemia   . Hematochezia 08/14/2012  . ETOH abuse 08/14/2012  . Pseudocyst of pancreas 08/12/2012  . Acute pancreatitis 08/12/2012  . GERD (gastroesophageal reflux disease) 06/08/2012  . Rectal fissure 05/11/2011  . Rectal fistula 05/11/2011  . Hiatal hernia 04/28/2011  . Gastritis 04/28/2011  . Nausea 04/27/2011  . Acute epigastric pain 04/27/2011  . Obesity, Class III, BMI 40-49.9 (morbid obesity) ([place]) 04/27/2011  . Solitary rectal ulcer 04/12/2011  . Hemorrhoids 03/10/2011  . Ulcerative proctitis, nonspecific ([place]) 03/10/2011  . DIVERTICULOSIS, COLON 10/22/2009  .  COLONIC POLYPS, HYPERPLASTIC, HX OF 10/22/2009  . INFECTIOUS DIARRHEA 09/27/2008  . ULCERATIVE PROCTITIS 09/27/2008  . NAUSEA AND VOMITING 09/27/2008  . NAUSEA ALONE 09/27/2008  . DIARRHEA 09/27/2008  . ABDOMINAL PAIN -GENERALIZED 09/27/2008  . DEPRESSION 09/12/2007  . HEMORRHOIDS 09/12/2007  . HIATAL HERNIA 09/12/2007  . ULCERATIVE COLITIS 09/12/2007  . RECTAL PAIN 09/12/2007  . BIPOLAR  AFFECTIVE DISORDER 12/06/2006  . TOBACCO ABUSE 12/06/2006  . Esophageal reflux 12/06/2006  . Cough 12/06/2006    Current Outpatient Prescriptions on File Prior to Visit  Medication Sig Dispense Refill  . alum & mag hydroxide-simeth (MAALOX/MYLANTA) 200-200-20 MG/5ML suspension Take 30 mLs by mouth every 6 (six) hours as needed for indigestion or heartburn (dyspepsia). 355 mL 0  . ARIPiprazole (ABILIFY) 30 MG tablet     . diclofenac sodium (VOLTAREN) 1 % GEL     . FLUoxetine (PROZAC) 40 MG capsule     . HYDROcodone-acetaminophen (NORCO/VICODIN) 5-325 MG tablet Take 1 tablet by mouth every 6 (six) hours as needed for moderate pain. x3 daily PRN    . hydrocortisone (ANUSOL-HC) 2.5 % rectal cream Place 1 application rectally 2 (two) times daily. 30 g 1  . hydrOXYzine (VISTARIL) 25 MG capsule Take 25 mg by mouth 2 (two) times daily.  0  . lamoTRIgine (LAMICTAL) 100 MG tablet Take 100 mg by mouth daily. Patient taking x2 daily    . methocarbamol (ROBAXIN) 500 MG tablet Take 500 mg by mouth 2 (two) times daily.    . Multiple Vitamin (MULTIVITAMIN WITH MINERALS) TABS tablet Take 1 tablet by mouth daily. 30 tablet 0  . PHENADOZ 12.5 MG suppository Unwrap and insert 1 suppository every 6 hours if needed for nausea 30 suppository 0  . promethazine (PHENERGAN) 12.5 MG suppository Unwrap and insert 1 suppository every 4 hours if needed for nausea 30 suppository 0  . ondansetron (ZOFRAN) 4 MG tablet Take 1 tablet (4 mg total) by mouth every 6 (six) hours. (Patient not taking: Reported on 09/09/2016) 12 tablet 0   No current facility-administered medications on file prior to visit.     Past Medical History:  Diagnosis Date  . Anal fistula   . Anemia   . Bipolar disorder ([place])   . Chronic kidney disease   . Depressive disorder, not elsewhere classified   . Diverticulosis of colon (without mention of hemorrhage)   . Esophageal reflux   . GERD (gastroesophageal reflux disease)   . Hepatic steatosis     . Hiatal hernia   . History of recurrent UTIs   . Hyperlipidemia   . Hypertension   . Iron deficiency   . Obesity   . Pancreatitis   . Pancreatitis   . Personal history of colonic polyps 10/16/2009   hyperplastic  . Pre-diabetes   . Seizures ([place])   . Ulcerative colitis, unspecified     Past Surgical History:  Procedure Laterality Date  . FINGER AMPUTATION  2010   right index  . HEMORROIDECTOMY    . KNEE ARTHROSCOPY     bilateral, left x2  . TOTAL ABDOMINAL HYSTERECTOMY      Social History   Social History  . Marital status: Single    Spouse name: N/A  . Number of children: 0  . Years of education: 2   Occupational History  . produce clerk   .  Unemployed   Social History Main Topics  . Smoking status: Current Every Day Smoker    Types: Cigarettes  . Smokeless tobacco: Never  Used     Comment: pt states she smokes 1-2 cigarettes/ day  . Alcohol use 0.6 oz/week    1 Shots of liquor per week  . Drug use: No  . Sexual activity: Not Asked   Other Topics Concern  . None   Social History Narrative  . None    Family History  Problem Relation Age of Onset  . Irritable bowel syndrome Mother   . Hypertension Mother   . Atrial fibrillation Mother   . Ovarian cancer Maternal Aunt   . Diabetes Father   . Heart attack Father   . Hypertension Father   . Hyperlipidemia Father   . Thyroid disease Sister   . Hyperlipidemia Sister   . Thyroid disease Brother         Review of Systems  Constitutional: Negative for fever, malaise/fatigue and weight loss.  HENT: Negative for congestion and sore throat.   Eyes:       Negative for visual changes  Respiratory: Positive for cough. Negative for shortness of breath.   Cardiovascular: Negative for chest pain, palpitations and leg swelling.  Gastrointestinal: Positive for heartburn. Negative for blood in stool, constipation and diarrhea.  Genitourinary: Negative for dysuria, frequency and urgency.  Musculoskeletal:  Negative for falls, joint pain and myalgias.  Skin: Negative for rash.  Neurological: Negative for dizziness, sensory change and headaches.  Endo/Heme/Allergies: Does not bruise/bleed easily.  Psychiatric/Behavioral: Negative for depression, substance abuse and suicidal ideas. The patient is not nervous/anxious.     Objective:   Vitals:   09/09/16 1105  BP: 110/70  Pulse: 77  Temp: 98.9 F (37.2 C)    Body mass index is 32.78 kg/m.   Physical Examination:  Physical Exam  Constitutional: She is oriented to person, place, and time and well-developed, well-nourished, and in no distress. No distress.  HENT:  Right Ear: External ear normal.  Left Ear: External ear normal.  Nose: Nose normal.  Mouth/Throat: Oropharynx is clear and moist. No oropharyngeal exudate.  Eyes: Pupils are equal, round, and reactive to light. Conjunctivae and EOM are normal. No scleral icterus.  Neck: Normal range of motion. Neck supple. No thyromegaly present.  Cardiovascular: Normal rate, regular rhythm, normal heart sounds and intact distal pulses.   Pulmonary/Chest: Effort normal and breath sounds normal. She exhibits no tenderness.  Declined breast exam  Abdominal: Soft. She exhibits no distension. There is no tenderness.  Genitourinary:  Genitourinary Comments: S/p hysterectomy. Declined by patient.  Musculoskeletal: Normal range of motion. She exhibits no edema, tenderness or deformity.  Lymphadenopathy:    She has no cervical adenopathy.  Neurological: She is alert and oriented to person, place, and time. Gait normal. Coordination normal.  Skin: Skin is warm and dry.  Psychiatric: Affect and judgment normal.  Vitals reviewed.   ASSESSMENT and PLAN:  [place] was seen today for annual exam.  Diagnoses and all orders for this visit:  Encounter for preventative adult health care examination  Gastroesophageal reflux disease without esophagitis -     Dexlansoprazole 30 MG capsule; Take 1  capsule (30 mg total) by mouth daily.  Essential hypertension -     losartan (COZAAR) 50 MG tablet; Take 1 tablet (50 mg total) by mouth daily.  Mixed hyperlipidemia -     atorvastatin (LIPITOR) 40 MG tablet; take 1 tablet by mouth at bedtime ON MONDAY, WEDNESDAY AND FRIDAY  TOBACCO ABUSE  Chronic allergic rhinitis -     fluticasone (FLONASE) 50 MCG/ACT nasal spray; instill 2 sprays  into each nostril once daily    TOBACCO ABUSE I spent 47mins with patient discussing different methods of smoking cessation: nicotine patch, chantix, and electronic cigarettes; incorporated with attending smoking cessation session offered by [facility]. We talked about how talked about how each methods can be implemented. She decided to try nicotine patch at this time. I advised her to start with 14 or 21mg  patches. Advised her not to smoke while wearing the patch. She verbalized understanding.      Follow up: Return in about 6 months (around 03/12/2017) for HTN and, hyperlipidemia (fasting).  Wilfred Lacy, NP

## 2016-09-09 NOTE — Patient Instructions (Addendum)
No additional labs needed today.  Please schedule mammogram and have report faxed to me.  Please get report of tetanus vaciine.  Let me know if you decide to use chantix?  Tobacco Use Disorder Tobacco use disorder (TUD) is a mental disorder. It is the long-term use of tobacco in spite of related health problems or difficulty with normal life activities. Tobacco is most commonly smoked as cigarettes and less commonly as cigars or pipes. Smokeless chewing tobacco and snuff are also popular. People with TUD get a feeling of extreme pleasure (euphoria) from using tobacco and have a desire to use it again and again. Repeated use of tobacco can cause problems. The addictive effects of tobacco are due mainly tothe ingredient nicotine. Nicotine also causes a rush of adrenaline (epinephrine) in the body. This leads to increased blood pressure, heart rate, and breathing rate. These changes may cause problems for people with high blood pressure, weak hearts, or lung disease. High doses of nicotine in children and pets can lead to seizures and death. Tobacco contains a number of other unsafe chemicals. These chemicals are especially harmful when inhaled as smoke and can damage almost every organ in the body. Smokers live shorter lives than nonsmokers and are at risk of dying from a number of diseases and cancers. Tobacco smoke can also cause health problems for nonsmokers (due to inhaling secondhand smoke). Smoking is also a fire hazard. TUD usually starts in the late teenage years and is most common in young adults between the ages of 85 and 24 years. People who start smoking earlier in life are more likely to continue smoking as adults. TUD is somewhat more common in men than women. People with TUD are at higher risk for using alcohol and other drugs of abuse. What increases the risk? Risk factors for TUD include:  Having family members with the disorder.  Being around people who use tobacco.  Having an  existing mental health issue such as schizophrenia, depression, bipolar disorder, ADHD, or posttraumatic stress disorder (PTSD).  What are the signs or symptoms? People with tobacco use disorder have two or more of the following signs and symptoms within 12 months:  Use of more tobacco over a longer period than intended.  Not able to cut down or control tobacco use.  A lot of time spent obtaining or using tobacco.  Strong desire or urge to use tobacco (craving). Cravings may last for 6 months or longer after quitting.  Use of tobacco even when use leads to major problems at work, school, or home.  Use of tobacco even when use leads to relationship problems.  Giving up or cutting down on important life activities because of tobacco use.  Repeatedly using tobacco in situations where it puts you or others in physical danger, like smoking in bed.  Use of tobacco even when it is known that a physical or mental problem is likely related to tobacco use. ? Physical problems are numerous and may include chronic bronchitis, emphysema, lung and other cancers, gum disease, high blood pressure, heart disease, and stroke. ? Mental problems caused by tobacco may include difficulty sleeping and anxiety.  Need to use greater amounts of tobacco to get the same effect. This means you have developed a tolerance.  Withdrawal symptoms as a result of stopping or rapidly cutting back use. These symptoms may last a month or more after quitting and include the following: ? Depressed, anxious, or irritable mood. ? Difficulty concentrating. ? Increased appetite. ?  Restlessness or trouble sleeping. ? Use of tobacco to avoid withdrawal symptoms.  How is this diagnosed? Tobacco use disorder is diagnosed by your health care provider. A diagnosis may be made by:  Your health care provider asking questions about your tobacco use and any problems it may be causing.  A physical exam.  Lab tests.  You may be  referred to a mental health professional or addiction specialist.  The severity of tobacco use disorder depends on the number of signs and symptoms you have:  Mild-Two or three symptoms.  Moderate-Four or five symptoms.  Severe-Six or more symptoms.  How is this treated? Many people with tobacco use disorder are unable to quit on their own and need help. Treatment options include the following:  Nicotine replacement therapy (NRT). NRT provides nicotine without the other harmful chemicals in tobacco. NRT gradually lowers the dosage of nicotine in the body and reduces withdrawal symptoms. NRT is available in over-the-counter forms (gum, lozenges, and skin patches) as well as prescription forms (mouth inhaler and nasal spray).  Medicines.This may include: ? Antidepressant medicine that may reduce nicotine cravings. ? A medicine that acts on nicotine receptors in the brain to reduce cravings and withdrawal symptoms. It may also block the effects of tobacco in people with TUD who relapse.  Counseling or talk therapy. A form of talk therapy called behavioral therapy is commonly used to treat people with TUD. Behavioral therapy looks at triggers for tobacco use, how to avoid them, and how to cope with cravings. It is most effective in person or by phone but is also available in self-help forms (books and Internet websites).  Support groups. These provide emotional support, advice, and guidance for quitting tobacco.  The most effective treatment for TUD is usually a combination of medicine, talk therapy, and support groups. Follow these instructions at home:  Keep all follow-up visits as directed by your health care provider. This is important.  Take medicines only as directed by your health care provider.  Check with your health care provider before starting new prescription or over-the-counter medicines. Contact a health care provider if:  You are not able to take your medicines as  prescribed.  Treatment is not helping your TUD and your symptoms get worse. Get help right away if:  You have serious thoughts about hurting yourself or others.  You have trouble breathing, chest pain, sudden weakness, or sudden numbness in part of your body. This information is not intended to replace advice given to you by your health care provider. Make sure you discuss any questions you have with your health care provider. Document Released: 10/15/2003 Document Revised: 10/12/2015 Document Reviewed: 04/06/2013 Elsevier Interactive Patient Education  [employer].

## 2016-09-09 NOTE — Assessment & Plan Note (Signed)
I spent 39mins with patient discussing different methods of smoking cessation: nicotine patch, chantix, and electronic cigarettes; incorporated with attending smoking cessation session offered by [facility]. We talked about how talked about how each methods can be implemented. She decided to try nicotine patch at this time. I advised her to start with 14 or 21mg  patches. Advised her not to smoke while wearing the patch. She verbalized understanding.

## 2016-09-13 DIAGNOSIS — F319 Bipolar disorder, unspecified: Secondary | ICD-10-CM | POA: Diagnosis not present

## 2016-09-23 ENCOUNTER — Telehealth: Payer: Self-pay

## 2016-09-23 ENCOUNTER — Institutional Professional Consult (permissible substitution): Payer: PPO | Admitting: Pulmonary Disease

## 2016-09-23 ENCOUNTER — Telehealth: Payer: Self-pay | Admitting: Nurse Practitioner

## 2016-09-23 MED ORDER — PROMETHAZINE HCL 12.5 MG RE SUPP: 12.5000 mg | Freq: Three times a day (TID) | RECTAL | 0 refills | Status: DC | PRN

## 2016-09-23 NOTE — Telephone Encounter (Signed)
Patient called requesting [facility] to fill phenergan suppositories.  States GI was contacted and she was referred to contact [facility].  Please see phone note to GI dated 09/23/16.

## 2016-09-23 NOTE — Telephone Encounter (Signed)
She has not been seen in a year and a half. I told her to resume care with her primary care provider. Her primary care provider can prescribe the suppositories for the patient if they wish

## 2016-09-23 NOTE — Telephone Encounter (Signed)
Pt has been notified and aware. She states clear understanding to contact her primary doctor.

## 2016-09-23 NOTE — Telephone Encounter (Signed)
[facility] is calling on behalf of the patient. She is requesting a refill on her Phenergan suppositories. Last filled on 08-23-2016 # 30 last seen 04-28-2015. Please advise the patient is waiting at the pharmacy.

## 2016-09-24 NOTE — Telephone Encounter (Signed)
Notified pt [name] [name] sent refill to [facility]...Arsenio Katz

## 2016-09-27 ENCOUNTER — Telehealth: Payer: Self-pay | Admitting: Emergency Medicine

## 2016-09-27 ENCOUNTER — Institutional Professional Consult (permissible substitution): Payer: PPO | Admitting: Emergency Medicine

## 2016-09-27 NOTE — Telephone Encounter (Signed)
Spoke with pt to notify that office would have to be closed this am due to power outage. We will have to reschedule. She understands the plan

## 2016-09-28 DIAGNOSIS — M5033 Other cervical disc degeneration, cervicothoracic region: Secondary | ICD-10-CM | POA: Diagnosis not present

## 2016-09-28 DIAGNOSIS — M542 Cervicalgia: Secondary | ICD-10-CM | POA: Diagnosis not present

## 2016-09-28 DIAGNOSIS — M5416 Radiculopathy, lumbar region: Secondary | ICD-10-CM | POA: Diagnosis not present

## 2016-09-28 DIAGNOSIS — M5135 Other intervertebral disc degeneration, thoracolumbar region: Secondary | ICD-10-CM | POA: Diagnosis not present

## 2016-09-28 DIAGNOSIS — M5031 Other cervical disc degeneration,  high cervical region: Secondary | ICD-10-CM | POA: Diagnosis not present

## 2016-09-28 DIAGNOSIS — M546 Pain in thoracic spine: Secondary | ICD-10-CM | POA: Diagnosis not present

## 2016-09-28 DIAGNOSIS — M533 Sacrococcygeal disorders, not elsewhere classified: Secondary | ICD-10-CM | POA: Diagnosis not present

## 2016-09-28 DIAGNOSIS — M5441 Lumbago with sciatica, right side: Secondary | ICD-10-CM | POA: Diagnosis not present

## 2016-09-28 DIAGNOSIS — M47817 Spondylosis without myelopathy or radiculopathy, lumbosacral region: Secondary | ICD-10-CM | POA: Diagnosis not present

## 2016-09-28 DIAGNOSIS — M791 Myalgia: Secondary | ICD-10-CM | POA: Diagnosis not present

## 2016-09-28 DIAGNOSIS — Z79891 Long term (current) use of opiate analgesic: Secondary | ICD-10-CM | POA: Diagnosis not present

## 2016-09-28 DIAGNOSIS — M5137 Other intervertebral disc degeneration, lumbosacral region: Secondary | ICD-10-CM | POA: Diagnosis not present

## 2016-10-12 DIAGNOSIS — M791 Myalgia: Secondary | ICD-10-CM | POA: Diagnosis not present

## 2016-10-12 DIAGNOSIS — M47817 Spondylosis without myelopathy or radiculopathy, lumbosacral region: Secondary | ICD-10-CM | POA: Diagnosis not present

## 2016-10-12 DIAGNOSIS — M5416 Radiculopathy, lumbar region: Secondary | ICD-10-CM | POA: Diagnosis not present

## 2016-10-12 DIAGNOSIS — M533 Sacrococcygeal disorders, not elsewhere classified: Secondary | ICD-10-CM | POA: Diagnosis not present

## 2016-10-18 ENCOUNTER — Telehealth: Payer: Self-pay | Admitting: Nurse Practitioner

## 2016-10-18 DIAGNOSIS — Z716 Tobacco abuse counseling: Secondary | ICD-10-CM

## 2016-10-18 NOTE — Telephone Encounter (Signed)
I did not refer her to pulmonology. It is ok to wait till oct for appt with pulmonology. Does she want to try chantix?

## 2016-10-18 NOTE — Telephone Encounter (Signed)
Patient saw Baldo Ash back in July. She states she has a referral to see pulmonary, but they can not see her until Oct. She wants to know would it just be a good idea for her to come back and see Wilfred Lacy to get a chest xray then follow up with them.   She also would like a RX to help her quit smoking. Please advise. Thank you.

## 2016-10-18 NOTE — Telephone Encounter (Signed)
Please advise 

## 2016-10-19 MED ORDER — VARENICLINE TARTRATE 0.5 MG X 11 & 1 MG X 42 PO MISC: ORAL | 0 refills | Status: DC

## 2016-10-19 NOTE — Telephone Encounter (Signed)
Pt verbalized understand.   She would like to try chantix, can we call it in to [facility].

## 2016-10-19 NOTE — Telephone Encounter (Signed)
rx faxed to [facility].

## 2016-10-27 ENCOUNTER — Institutional Professional Consult (permissible substitution): Payer: PPO | Admitting: Pulmonary Disease

## 2016-11-08 ENCOUNTER — Ambulatory Visit (INDEPENDENT_AMBULATORY_CARE_PROVIDER_SITE_OTHER)
Admission: RE | Admit: 2016-11-08 | Discharge: 2016-11-08 | Disposition: A | Discharge status: Home / Self Care | Payer: PPO | Source: Ambulatory Visit | Attending: Emergency Medicine | Admitting: Emergency Medicine

## 2016-11-08 ENCOUNTER — Ambulatory Visit (INDEPENDENT_AMBULATORY_CARE_PROVIDER_SITE_OTHER): Payer: PPO | Admitting: Emergency Medicine

## 2016-11-08 ENCOUNTER — Institutional Professional Consult (permissible substitution): Payer: PPO | Admitting: Pulmonary Disease

## 2016-11-08 ENCOUNTER — Telehealth: Payer: Self-pay | Admitting: Emergency Medicine

## 2016-11-08 ENCOUNTER — Encounter: Payer: Self-pay | Admitting: Emergency Medicine

## 2016-11-08 ENCOUNTER — Other Ambulatory Visit: Payer: Self-pay | Admitting: Emergency Medicine

## 2016-11-08 VITALS — BP 128/90 | HR 83 | Ht 65.0 in | Wt 198.0 lb

## 2016-11-08 DIAGNOSIS — R05 Cough: Secondary | ICD-10-CM | POA: Diagnosis not present

## 2016-11-08 DIAGNOSIS — R911 Solitary pulmonary nodule: Secondary | ICD-10-CM

## 2016-11-08 DIAGNOSIS — R059 Cough, unspecified: Secondary | ICD-10-CM

## 2016-11-08 MED ORDER — HYDROCOD POLST-CPM POLST ER 10-8 MG/5ML PO SUER: 5.0000 mL | Freq: Every evening | ORAL | 0 refills | Status: DC | PRN

## 2016-11-08 NOTE — Patient Instructions (Addendum)
Please continue your Dexilant as you are taking it.  Continue your fluticasone nasal spray 2 sprays once a day.  Start taking loratadine 10mg  once a day We will perform full pulmonary function testing.  CXR today.  We will write a prescription for Tussionex 5cc to be taken at bedtime as needed for cough. Please take this to the [facility] with you to review this and our plans before filling it. This will insure that they know everything that you are taking.  Continue your Chantix and to work on decreasing your smoking.

## 2016-11-08 NOTE — Telephone Encounter (Signed)
Discussed CXR results with patient. ? A RUL nodular opacity. I have recommended a CT chest to better evaluate. She understands and agrees.   Please order a super D CT chest with contrast. Thanks.

## 2016-11-08 NOTE — Progress Notes (Signed)
Subjective:    Patient ID: [name] [name], female    DOB: Jul 19, 1960, 56 y.o.   MRN: [id number]  HPI 56 year old smoker (20+ pack years), history of obesity, GERD. She has been followed in the past in our office for chronic cough. This was felt to be mostly upper airway in nature, was ascribed to GERD / chronic LPR. She was treated with cough suppression, narcotic cough syrup. She was on Reglan, not currently. She is currently on Dexilant. Then beginning about 1 yr ago she started to have more and more cough, has evolved paroxysms that are associated with SOB. She wonders if her reflux id well controlled, also notes that her Norco was decreased about 1 year ago. She wonders if that is part of the reason. She has not benefited from tessalon in the past. She is on fluticasone NS qd. Cough usually non-productive, occasionally thick white. She is occasionally SOB with exertion, household chores.   She underwent a sleep study 03/09/14 that show evidence for obstructive sleep apnea. I don't see any in PFT testing in the chart.   Review of Systems  Constitutional: Negative for fever and unexpected weight change.  HENT: Negative for congestion, dental problem, ear pain, nosebleeds, postnasal drip, rhinorrhea, sinus pressure, sneezing, sore throat and trouble swallowing.   Eyes: Negative for redness and itching.  Respiratory: Positive for cough and shortness of breath. Negative for chest tightness and wheezing.   Cardiovascular: Negative for palpitations and leg swelling.  Gastrointestinal: Negative for nausea and vomiting.  Genitourinary: Negative for dysuria.  Musculoskeletal: Negative for joint swelling.  Skin: Negative for rash.  Neurological: Negative for headaches.  Hematological: Does not bruise/bleed easily.  Psychiatric/Behavioral: Negative for dysphoric mood. The patient is not nervous/anxious.     Past Medical History:  Diagnosis Date  . Anal fistula   . Anemia   . Bipolar disorder  ([place])   . Chronic kidney disease   . Depressive disorder, not elsewhere classified   . Diverticulosis of colon (without mention of hemorrhage)   . Esophageal reflux   . GERD (gastroesophageal reflux disease)   . Hepatic steatosis   . Hiatal hernia   . History of recurrent UTIs   . Hyperlipidemia   . Hypertension   . Iron deficiency   . Obesity   . Pancreatitis   . Pancreatitis   . Personal history of colonic polyps 10/16/2009   hyperplastic  . Pre-diabetes   . Seizures ([place])   . Ulcerative colitis, unspecified      Family History  Problem Relation Age of Onset  . Irritable bowel syndrome Mother   . Hypertension Mother   . Atrial fibrillation Mother   . Ovarian cancer Maternal Aunt   . Diabetes Father   . Heart attack Father   . Hypertension Father   . Hyperlipidemia Father   . Thyroid disease Sister   . Hyperlipidemia Sister   . Thyroid disease Brother      Social History   Social History  . Marital status: Single    Spouse name: N/A  . Number of children: 0  . Years of education: 2   Occupational History  . produce clerk   .  Unemployed   Social History Main Topics  . Smoking status: Current Every Day Smoker    Types: Cigarettes  . Smokeless tobacco: Never Used     Comment: pt states she smokes 1-2 cigarettes/ day  . Alcohol use 0.6 oz/week    1 Shots of liquor  per week  . Drug use: No  . Sexual activity: Not on file   Other Topics Concern  . Not on file   Social History Narrative  . No narrative on file     No Known Allergies   Outpatient Medications Prior to Visit  Medication Sig Dispense Refill  . ARIPiprazole (ABILIFY) 30 MG tablet Take 30 mg by mouth daily.     Marland Kitchen atorvastatin (LIPITOR) 40 MG tablet take 1 tablet by mouth at bedtime ON MONDAY, WEDNESDAY AND FRIDAY 90 tablet 2  . Dexlansoprazole 30 MG capsule Take 1 capsule (30 mg total) by mouth daily. 90 capsule 2  . diclofenac sodium (VOLTAREN) 1 % GEL Apply 2 g topically 4 (four) times  daily.     Marland Kitchen FLUoxetine (PROZAC) 40 MG capsule Take 40 mg by mouth daily.     . fluticasone (FLONASE) 50 MCG/ACT nasal spray instill 2 sprays into each nostril once daily 16 g 5  . HYDROcodone-acetaminophen (NORCO/VICODIN) 5-325 MG tablet Take 1 tablet by mouth every 6 (six) hours as needed for moderate pain. x3 daily PRN    . hydrOXYzine (VISTARIL) 25 MG capsule Take 25 mg by mouth 2 (two) times daily.  0  . lamoTRIgine (LAMICTAL) 100 MG tablet Take 100 mg by mouth daily. Patient taking x2 daily    . losartan (COZAAR) 50 MG tablet Take 1 tablet (50 mg total) by mouth daily. 90 tablet 2  . methocarbamol (ROBAXIN) 500 MG tablet Take 500 mg by mouth 2 (two) times daily.    . Multiple Vitamin (MULTIVITAMIN WITH MINERALS) TABS tablet Take 1 tablet by mouth daily. 30 tablet 0  . promethazine (PHENERGAN) 12.5 MG suppository Place 1 suppository (12.5 mg total) rectally every 8 (eight) hours as needed for nausea or vomiting. 20 suppository 0  . varenicline (CHANTIX PAK) 0.5 MG X 11 & 1 MG X 42 tablet Take one 0.5 mg tablet by mouth once daily for 3 days, then increase to one 0.5 mg tablet twice daily for 4 days, then increase to one 1 mg tablet twice daily. 53 tablet 0  . ondansetron (ZOFRAN) 4 MG tablet Take 1 tablet (4 mg total) by mouth every 6 (six) hours. 12 tablet 0  . alum & mag hydroxide-simeth (MAALOX/MYLANTA) 200-200-20 MG/5ML suspension Take 30 mLs by mouth every 6 (six) hours as needed for indigestion or heartburn (dyspepsia). 355 mL 0  . hydrocortisone (ANUSOL-HC) 2.5 % rectal cream Place 1 application rectally 2 (two) times daily. 30 g 1   No facility-administered medications prior to visit.         Objective:   Physical Exam  Vitals:   11/08/16 1140  BP: 128/90  Pulse: 83  SpO2: 96%  Weight: 198 lb (89.8 kg)  Height: 5\' 5"  (1.651 m)   Gen: Pleasant, well-nourished, in no distress,  normal affect  ENT: No lesions,  mouth clear,  oropharynx clear, no postnasal drip  Neck: No  JVD, no TMG, no carotid bruits  Lungs: No use of accessory muscles, no dullness to percussion, clear without rales or rhonchi  Cardiovascular: RRR, heart sounds normal, no murmur or gallops, no peripheral edema  Musculoskeletal: No deformities, no cyanosis or clubbing  Neuro: alert, non focal  Skin: Warm, no lesions or rashes     Assessment & Plan:  Cough Suspect that this is multifactorial, mostly UA in nature. Believe that we need to treat possible contributors to UA irritation. Also need CXR, PFT to assess for AFL and  parenchymal disease. May empirically increase her PPI at some point in the future to see if this impacts sx. As above she may be a candidate for BD's depending on the PFT.   Please continue your Dexilant as you are taking it.  Continue your fluticasone nasal spray 2 sprays once a day.  Start taking loratadine 10mg  once a day We will perform full pulmonary function testing.  CXR today.  We will write a prescription for Tussionex 5cc to be taken at bedtime as needed for cough. Please take this to the [facility] with you to review this and our plans before filling it. This will insure that they know everything that you are taking.  Continue your Chantix and to work on decreasing your smoking.   Baltazar Apo, MD, PhD 11/08/2016, 12:21 PM [facility] [phone] or if no answer [phone]

## 2016-11-08 NOTE — Assessment & Plan Note (Signed)
Suspect that this is multifactorial, mostly UA in nature. Believe that we need to treat possible contributors to UA irritation. Also need CXR, PFT to assess for AFL and parenchymal disease. May empirically increase her PPI at some point in the future to see if this impacts sx. As above she may be a candidate for BD's depending on the PFT.   Please continue your Dexilant as you are taking it.  Continue your fluticasone nasal spray 2 sprays once a day.  Start taking loratadine 10mg  once a day We will perform full pulmonary function testing.  CXR today.  We will write a prescription for Tussionex 5cc to be taken at bedtime as needed for cough. Please take this to the Pain Clinic with you to review this and our plans before filling it. This will insure that they know everything that you are taking.  Continue your Chantix and to work on decreasing your smoking.

## 2016-11-08 NOTE — Telephone Encounter (Deleted)
RB has called and spoke with pt about results. Per RB >> pt needs a chest CT.

## 2016-11-08 NOTE — Telephone Encounter (Signed)
CT has been ordered per RB. Nothing further was needed.

## 2016-11-09 ENCOUNTER — Ambulatory Visit: Payer: PPO

## 2016-11-10 ENCOUNTER — Ambulatory Visit (INDEPENDENT_AMBULATORY_CARE_PROVIDER_SITE_OTHER)
Admission: RE | Admit: 2016-11-10 | Discharge: 2016-11-10 | Disposition: A | Discharge status: Home / Self Care | Payer: PPO | Source: Ambulatory Visit | Attending: Emergency Medicine | Admitting: Emergency Medicine

## 2016-11-10 ENCOUNTER — Institutional Professional Consult (permissible substitution): Payer: PPO | Admitting: Pulmonary Disease

## 2016-11-10 DIAGNOSIS — R911 Solitary pulmonary nodule: Secondary | ICD-10-CM

## 2016-11-10 DIAGNOSIS — M5136 Other intervertebral disc degeneration, lumbar region: Secondary | ICD-10-CM | POA: Diagnosis not present

## 2016-11-10 DIAGNOSIS — M7060 Trochanteric bursitis, unspecified hip: Secondary | ICD-10-CM | POA: Diagnosis not present

## 2016-11-10 DIAGNOSIS — Z5181 Encounter for therapeutic drug level monitoring: Secondary | ICD-10-CM | POA: Diagnosis not present

## 2016-11-10 DIAGNOSIS — R918 Other nonspecific abnormal finding of lung field: Secondary | ICD-10-CM | POA: Diagnosis not present

## 2016-11-10 DIAGNOSIS — Z79891 Long term (current) use of opiate analgesic: Secondary | ICD-10-CM | POA: Diagnosis not present

## 2016-11-10 DIAGNOSIS — G894 Chronic pain syndrome: Secondary | ICD-10-CM | POA: Diagnosis not present

## 2016-11-10 DIAGNOSIS — M5416 Radiculopathy, lumbar region: Secondary | ICD-10-CM | POA: Diagnosis not present

## 2016-11-10 DIAGNOSIS — M533 Sacrococcygeal disorders, not elsewhere classified: Secondary | ICD-10-CM | POA: Diagnosis not present

## 2016-11-10 DIAGNOSIS — M47817 Spondylosis without myelopathy or radiculopathy, lumbosacral region: Secondary | ICD-10-CM | POA: Diagnosis not present

## 2016-11-10 MED ORDER — IOPAMIDOL (ISOVUE-300) INJECTION 61%
80.0000 mL | Freq: Once | INTRAVENOUS | Status: AC | PRN
  Administered 2016-11-10: 80 mL via INTRAVENOUS

## 2016-11-11 ENCOUNTER — Telehealth: Payer: Self-pay | Admitting: Emergency Medicine

## 2016-11-11 DIAGNOSIS — R911 Solitary pulmonary nodule: Secondary | ICD-10-CM

## 2016-11-11 NOTE — Telephone Encounter (Signed)
ATC, NA- LMOM explaining that we will have to wait until SG looks at the results and she is not in the office this pm  Will send back to SG to advise on results, thanks!

## 2016-11-11 NOTE — Telephone Encounter (Signed)
Patient is returning phone call-AJ

## 2016-11-11 NOTE — Telephone Encounter (Signed)
Spoke with [name] [name], she is very anxious about her results because they found something on xray. RB is not here for the rest of the week. SG could you review for [name] [name] so I can let her know something. Thanks.

## 2016-11-11 NOTE — Telephone Encounter (Signed)
Pt returning call for results and can be reached @ [phone].[name] [name]'

## 2016-11-11 NOTE — Telephone Encounter (Signed)
Pt calling back again a/b results of CT, please let her know something before you leave today.Hillery Hunter'

## 2016-11-11 NOTE — Telephone Encounter (Signed)
lmtcb x1 for pt. 

## 2016-11-12 NOTE — Telephone Encounter (Signed)
I have called [name] [name] with the results of her CT scan. I explained that there is a nodule of concern in her right upper lobe. I explained that it is concerning for a neoplasm. I explained that Dr. [name] is currently unavailable, but I'm going to go ahead and order a PET scan and see if we can get her pulmonary function tests scheduled sooner than the October 18 date they're currently scheduled for. [name] verbalized understanding of the above. She had no further questions. She understands that she will be called to schedule the above imaging/ and PFT appointments. Triage please place an order for PET scan under Dr. Agustina Caroli name. Please see if it's possible to move up her pulmonary function test appointment. Thank you so much.

## 2016-11-12 NOTE — Telephone Encounter (Signed)
PET has been ordered. Pt PFT has been moved to 11/16/16 @ 12:00. Nothing further needed.

## 2016-11-12 NOTE — Telephone Encounter (Signed)
Pt very anxious about her results of her CT

## 2016-11-12 NOTE — Telephone Encounter (Signed)
Pt calling to ck to see if Eric Form has seen her CT as of yet. [phone]

## 2016-11-15 NOTE — Telephone Encounter (Signed)
She has PFT on 9/26, PET on 9/27. Based on results will decide next best steps - may be a candidate for primary resection.

## 2016-11-17 ENCOUNTER — Other Ambulatory Visit: Payer: Self-pay | Admitting: Nurse Practitioner

## 2016-11-17 ENCOUNTER — Ambulatory Visit (INDEPENDENT_AMBULATORY_CARE_PROVIDER_SITE_OTHER): Payer: PPO | Admitting: Emergency Medicine

## 2016-11-17 DIAGNOSIS — R059 Cough, unspecified: Secondary | ICD-10-CM

## 2016-11-17 DIAGNOSIS — R05 Cough: Secondary | ICD-10-CM | POA: Diagnosis not present

## 2016-11-17 LAB — PULMONARY FUNCTION TEST
DL/VA % pred: 85 %
DL/VA: 4.2 ml/min/mmHg/L
DLCO cor % pred: 84 %
DLCO cor: 21.53 ml/min/mmHg
DLCO unc % pred: 84 %
DLCO unc: 21.53 ml/min/mmHg
FEF 25-75 Post: 3.81 L/sec
FEF 25-75 Pre: 3.12 L/sec
FEF2575-%Change-Post: 22 %
FEF2575-%Pred-Post: 147 %
FEF2575-%Pred-Pre: 120 %
FEV1-%Change-Post: 5 %
FEV1-%Pred-Post: 105 %
FEV1-%Pred-Pre: 100 %
FEV1-Post: 2.92 L
FEV1-Pre: 2.77 L
FEV1FVC-%Change-Post: 1 %
FEV1FVC-%Pred-Pre: 105 %
FEV6-%Change-Post: 3 %
FEV6-%Pred-Post: 100 %
FEV6-%Pred-Pre: 97 %
FEV6-Post: 3.45 L
FEV6-Pre: 3.32 L
FEV6FVC-%Pred-Post: 103 %
FEV6FVC-%Pred-Pre: 103 %
FVC-%Change-Post: 3 %
FVC-%Pred-Post: 97 %
FVC-%Pred-Pre: 93 %
FVC-Post: 3.45 L
FVC-Pre: 3.32 L
Post FEV1/FVC ratio: 85 %
Post FEV6/FVC ratio: 100 %
Pre FEV1/FVC ratio: 83 %
Pre FEV6/FVC Ratio: 100 %
RV % pred: 97 %
RV: 1.91 L
TLC % pred: 102 %
TLC: 5.34 L

## 2016-11-17 NOTE — Progress Notes (Signed)
PFT done today. 

## 2016-11-18 ENCOUNTER — Ambulatory Visit (HOSPITAL_COMMUNITY)
Admission: RE | Admit: 2016-11-18 | Discharge: 2016-11-18 | Disposition: A | Discharge status: Home / Self Care | Payer: PPO | Source: Ambulatory Visit | Attending: Emergency Medicine | Admitting: Emergency Medicine

## 2016-11-18 DIAGNOSIS — R911 Solitary pulmonary nodule: Secondary | ICD-10-CM | POA: Insufficient documentation

## 2016-11-18 DIAGNOSIS — R938 Abnormal findings on diagnostic imaging of other specified body structures: Secondary | ICD-10-CM | POA: Insufficient documentation

## 2016-11-18 DIAGNOSIS — I2510 Atherosclerotic heart disease of native coronary artery without angina pectoris: Secondary | ICD-10-CM | POA: Diagnosis not present

## 2016-11-18 DIAGNOSIS — R918 Other nonspecific abnormal finding of lung field: Secondary | ICD-10-CM | POA: Diagnosis not present

## 2016-11-18 DIAGNOSIS — K760 Fatty (change of) liver, not elsewhere classified: Secondary | ICD-10-CM | POA: Insufficient documentation

## 2016-11-18 LAB — GLUCOSE, CAPILLARY: Glucose-Capillary: 111 mg/dL — ABNORMAL HIGH (ref 65–99)

## 2016-11-18 MED ORDER — FLUDEOXYGLUCOSE F - 18 (FDG) INJECTION: 9.4000 | Freq: Once | INTRAVENOUS | Status: DC | PRN

## 2016-11-18 NOTE — Telephone Encounter (Signed)
I reviewed pulmonary function testing from 9/26. These show normal airflows, normal volumes, normal diffusion capacity. Await PET scan results from later today.

## 2016-11-19 ENCOUNTER — Telehealth: Payer: Self-pay | Admitting: Emergency Medicine

## 2016-11-19 ENCOUNTER — Telehealth: Payer: Self-pay | Admitting: Internal Medicine

## 2016-11-19 DIAGNOSIS — K629 Disease of anus and rectum, unspecified: Secondary | ICD-10-CM

## 2016-11-19 MED ORDER — PROMETHAZINE HCL 12.5 MG RE SUPP: 12.5000 mg | Freq: Two times a day (BID) | RECTAL | 0 refills | Status: DC | PRN

## 2016-11-19 NOTE — Telephone Encounter (Signed)
[name] [name] pt is calling about her PET scan results from yesterday.  She is very anxious for these results.  Please advise. Thanks

## 2016-11-19 NOTE — Telephone Encounter (Signed)
Order has been placed for the pt to get in to see Dr. [name] with GI for eval and the phenergan supp have been sent to the pharmacy.

## 2016-11-19 NOTE — Telephone Encounter (Signed)
Patient should be soon asap by one of the APPs next week. Any time of day

## 2016-11-19 NOTE — Telephone Encounter (Signed)
Appointment scheduled.

## 2016-11-19 NOTE — Telephone Encounter (Signed)
I spoke with pt regarding PET results. Nodule is mildly hypermetabolic. Also note rectal hypermetabolism, ? Related to her UC / proctitis. We will plan to follow with serial imaging, discuss further at her [place] 10/8.   Please set her up to see her GI, Dr [name] to assess the rectal lesion on PET.   Also, OK to call in phenergan suppositories, 12.5mg  PR q12h prn nausea. Disp # 10, RF none

## 2016-11-22 ENCOUNTER — Encounter: Payer: Self-pay | Admitting: Physician Assistant

## 2016-11-22 ENCOUNTER — Inpatient Hospital Stay: Admission: RE | Admit: 2016-11-22 | Payer: PPO | Source: Ambulatory Visit

## 2016-11-22 ENCOUNTER — Ambulatory Visit (INDEPENDENT_AMBULATORY_CARE_PROVIDER_SITE_OTHER): Payer: PPO | Admitting: Physician Assistant

## 2016-11-22 VITALS — BP 112/80 | HR 79 | Ht 65.0 in | Wt 199.0 lb

## 2016-11-22 DIAGNOSIS — K625 Hemorrhage of anus and rectum: Secondary | ICD-10-CM

## 2016-11-22 DIAGNOSIS — K648 Other hemorrhoids: Secondary | ICD-10-CM | POA: Diagnosis not present

## 2016-11-22 DIAGNOSIS — R948 Abnormal results of function studies of other organs and systems: Secondary | ICD-10-CM | POA: Diagnosis not present

## 2016-11-22 MED ORDER — NA SULFATE-K SULFATE-MG SULF 17.5-3.13-1.6 GM/177ML PO SOLN: 1.0000 | ORAL | 0 refills | Status: DC

## 2016-11-22 NOTE — Progress Notes (Signed)
Assessment and plans reviewed  

## 2016-11-22 NOTE — Progress Notes (Signed)
Chief Complaint: Abnormal PET scan, hemorrhoids, rectal bleeding  HPI: [name] [name] is a 56 year old Caucasian female with a past medical history as listed below, who was referred to me by Nche, Charlene Brooke, NP for a complaint of abnormal PET scan, hemorrhoids and rectal bleeding.      Patient typically follows with Dr. [name] and was last seen in clinic 04/28/15 for complaint of nausea and vomiting. Prior to that she followed with Dr. Sharlett Iles. Her last colonoscopy was completed 04/12/11 with a finding of diverticulosis, erosion in the rectum and no polyps or cancers. No pathology available for erosion in the rectum. Repeat colonoscopy was recommended in 10 years.    Patient had recent PET scan 11/18/16 for pulmonary nodule evaluate evaluation. This did show intense uptake and anus without CT correlation. It was discussed that this was a degree more than typically seen. They discussed that anal carcinoma could not be excluded.    Today, the patient presents to clinic and explains that she has had no abrupt changes in her rectal symptoms. She tells me that she typically has 4-5 stools per day which are somewhat uncontrollable but this has been her pattern ever since she was diagnosed with "proctitis" in the 90s. Patient also describes an occasional sharp pain in her rectum that comes and goes. Patient tells me her frequent stools per day make her internal hemorrhoids worse. They tend to "come out and get engorged". Patient is constantly applying Preparation H to this area. Associated symptoms include intermittent rectal bleeding.   Past medical history is positive for hemorrhoidectomy 2 with [facility]. None recently.   Patient denies fever, chills, change in bowel habits, anorexia, weight loss, nausea, vomiting, heartburn, reflux or symptoms that awaken her at night.  Past Medical History:  Diagnosis Date  . Anal fistula   . Anemia   . Bipolar disorder ([place])   . Chronic kidney disease    . Depressive disorder, not elsewhere classified   . Diverticulosis of colon (without mention of hemorrhage)   . Esophageal reflux   . GERD (gastroesophageal reflux disease)   . Hepatic steatosis   . Hiatal hernia   . History of recurrent UTIs   . Hyperlipidemia   . Hypertension   . Iron deficiency   . Obesity   . Pancreatitis   . Pancreatitis   . Personal history of colonic polyps 10/16/2009   hyperplastic  . Pre-diabetes   . Seizures ([place])   . Ulcerative colitis, unspecified     Past Surgical History:  Procedure Laterality Date  . FINGER AMPUTATION  2010   right index  . HEMORROIDECTOMY    . KNEE ARTHROSCOPY     bilateral, left x2  . TOTAL ABDOMINAL HYSTERECTOMY      Current Outpatient Prescriptions  Medication Sig Dispense Refill  . ARIPiprazole (ABILIFY) 30 MG tablet Take 30 mg by mouth daily.     Marland Kitchen atorvastatin (LIPITOR) 40 MG tablet take 1 tablet by mouth at bedtime ON MONDAY, WEDNESDAY AND FRIDAY 90 tablet 2  . chlorpheniramine-HYDROcodone (TUSSIONEX PENNKINETIC ER) 10-8 MG/5ML SUER Take 5 mLs by mouth at bedtime as needed for cough. 140 mL 0  . Dexlansoprazole 30 MG capsule Take 1 capsule (30 mg total) by mouth daily. 90 capsule 2  . diclofenac sodium (VOLTAREN) 1 % GEL Apply 2 g topically 4 (four) times daily.     Marland Kitchen FLUoxetine (PROZAC) 40 MG capsule Take 40 mg by mouth daily.     . fluticasone (  FLONASE) 50 MCG/ACT nasal spray instill 2 sprays into each nostril once daily 16 g 5  . HYDROcodone-acetaminophen (NORCO/VICODIN) 5-325 MG tablet Take 1 tablet by mouth every 6 (six) hours as needed for moderate pain. x3 daily PRN    . hydrOXYzine (VISTARIL) 25 MG capsule Take 25 mg by mouth 2 (two) times daily.  0  . lamoTRIgine (LAMICTAL) 100 MG tablet Take 100 mg by mouth daily. Patient taking x2 daily    . losartan (COZAAR) 50 MG tablet Take 1 tablet (50 mg total) by mouth daily. 90 tablet 2  . methocarbamol (ROBAXIN) 500 MG tablet Take 500 mg by mouth 2 (two) times  daily.    . Multiple Vitamin (MULTIVITAMIN WITH MINERALS) TABS tablet Take 1 tablet by mouth daily. 30 tablet 0  . promethazine (PHENERGAN) 12.5 MG suppository Place 1 suppository (12.5 mg total) rectally every 12 (twelve) hours as needed for nausea or vomiting. 10 suppository 0  . varenicline (CHANTIX PAK) 0.5 MG X 11 & 1 MG X 42 tablet Take one 0.5 mg tablet by mouth once daily for 3 days, then increase to one 0.5 mg tablet twice daily for 4 days, then increase to one 1 mg tablet twice daily. 53 tablet 0   No current facility-administered medications for this visit.    Facility-Administered Medications Ordered in Other Visits  Medication Dose Route Frequency Provider Last Rate Last Dose  . fludeoxyglucose F - 18 (FDG) injection 9.4 millicurie  9.4 millicurie Intravenous Once PRN Barnet Glasgow, MD        Allergies as of 11/22/2016  . (No Known Allergies)    Family History  Problem Relation Age of Onset  . Irritable bowel syndrome Mother   . Hypertension Mother   . Atrial fibrillation Mother   . Ovarian cancer Maternal Aunt   . Diabetes Father   . Heart attack Father   . Hypertension Father   . Hyperlipidemia Father   . Thyroid disease Sister   . Hyperlipidemia Sister   . Thyroid disease Brother   . Colon polyps Brother   . Stomach cancer Neg Hx   . Colon cancer Neg Hx     Social History   Social History  . Marital status: Single    Spouse name: N/A  . Number of children: 0  . Years of education: 2   Occupational History  . produce clerk   .  Unemployed   Social History Main Topics  . Smoking status: Current Every Day Smoker    Types: Cigarettes  . Smokeless tobacco: Never Used     Comment: pt states she smokes 1-2 cigarettes/ day  . Alcohol use 0.6 oz/week    1 Shots of liquor per week     Comment: occ  . Drug use: No  . Sexual activity: Not Currently   Other Topics Concern  . Not on file   Social History Narrative  . No narrative on file    Review of  Systems:    Constitutional: No weight loss, fever or chills Skin: No rash Cardiovascular: No chest pain Respiratory: No SOB Gastrointestinal: See HPI and otherwise negative Genitourinary: No dysuria  Neurological: No headache Musculoskeletal: No new muscle or joint pain Hematologic: No bruising Psychiatric: No history of depression or anxiety   Physical Exam:  Vital signs: BP 112/80   Pulse 79   Ht 5\' 5"  (1.651 m)   Wt 199 lb (90.3 kg)   BMI 33.12 kg/m    Constitutional:  Pleasant overweight Caucasian female appears to be in NAD, Well developed, Well nourished, alert and cooperative Head:  Normocephalic and atraumatic. Eyes:   PEERL, EOMI. No icterus. Conjunctiva pink. Ears:  Normal auditory acuity. Neck:  Supple Throat: Oral cavity and pharynx without inflammation, swelling or lesion.  Respiratory: Respirations even and unlabored. Lungs clear to auscultation bilaterally.   No wheezes, crackles, or rhonchi.  Cardiovascular: Normal S1, S2. No MRG. Regular rate and rhythm. No peripheral edema, cyanosis or pallor.  Gastrointestinal:  Soft, nondistended, nontender. No rebound or guarding. Normal bowel sounds. No appreciable masses or hepatomegaly. Rectal:  External exam: Multiple external hemorrhoid tags as well as one prolapsed internal hemorrhoid; anoscopy: Grade 3 inflamed internal hemorrhoids, no obvious rectal lesions Msk:  Symmetrical without gross deformities. Without edema, no deformity or joint abnormality.  Neurologic:  Alert and  oriented x4;  grossly normal neurologically.  Skin:   Dry and intact without significant lesions or rashes. Psychiatric: Demonstrates good judgement and reason without abnormal affect or behaviors.  MOST RECENT LABS AND IMAGING: CBC    Component Value Date/Time   WBC 7.9 10/30/2015 1212   RBC 3.69 (L) 10/30/2015 1212   HGB 13.1 10/30/2015 1212   HCT 37.9 10/30/2015 1212   PLT 268.0 10/30/2015 1212   MCV 102.8 (H) 10/30/2015 1212   MCH  33.3 03/13/2013 0510   MCHC 34.5 10/30/2015 1212   RDW 13.5 10/30/2015 1212   LYMPHSABS 1.4 10/30/2015 1212   MONOABS 0.7 10/30/2015 1212   EOSABS 0.1 10/30/2015 1212   BASOSABS 0.0 10/30/2015 1212    CMP     Component Value Date/Time   NA 136 06/24/2016 1045   K 4.8 06/24/2016 1045   CL 97 06/24/2016 1045   CO2 29 06/24/2016 1045   GLUCOSE 162 (H) 06/24/2016 1045   BUN 19 06/24/2016 1045   CREATININE 0.97 06/24/2016 1045   CALCIUM 10.2 06/24/2016 1045   PROT 7.0 10/30/2015 1212   ALBUMIN 3.7 10/30/2015 1212   AST 66 (H) 10/30/2015 1212   ALT 59 (H) 10/30/2015 1212   ALKPHOS 75 10/30/2015 1212   BILITOT 0.6 10/30/2015 1212   GFRNONAA >90 03/15/2013 0502   GFRAA >90 03/15/2013 0502   EXAM: NUCLEAR MEDICINE PET SKULL BASE TO THIGH 11/18/16  TECHNIQUE: 9.4 mCi F-18 FDG was injected intravenously. Full-ring PET imaging was performed from the skull base to thigh after the radiotracer. CT data was obtained and used for attenuation correction and anatomic localization.  FASTING BLOOD GLUCOSE:  Value: 111 mg/dl  COMPARISON:  None.  FINDINGS: NECK: Uptake at the floor of the mouth near midline on image 29 with no CT correlate is identified. No other abnormal uptake in the head or neck regions.  CHEST: The nodule in the right upper lobe seen on recent chest x-ray and CT imaging is unchanged in size measuring approximately 2 x 1.4 cm when accounting for difference in slice selection. There is low level uptake in this nodule with a maximum SUV of 2.5. No other abnormal FDG uptake within the chest.  ABDOMEN/PELVIS: There is increased uptake in the anus with no CT correlate. The maximum SUV is 20. No other abnormal uptake seen in the soft tissues of the abdomen or pelvis.  SKELETON: No focal hypermetabolic activity to suggest skeletal metastasis.  IMPRESSION: 1. The 2.1 x 1.4 cm nodule in the right upper lobe demonstrates low level FDG uptake with a maximum SUV  of 2.5. This level of uptake is equivocal and a low-grade  neoplasm/malignancy is not excluded. This level of uptake can be seen with benign etiologies however. The etiology could be confirmed with tissue sampling. 2. Intense uptake in the anus without CT correlate. While the level of normal uptake in the anus is widely variable on PET-CT imaging, the degree of uptake on today's study is more than typically seen. An underlying anal carcinoma is not excluded. Recommend correlation with physical exam. 3. Uptake in the floor of the mouth near midline is favored to be physiologic. Recommend physical exam in this region. 4. The tiny pulmonary nodules in the lung bases described on the recent CT of the chest are too small to characterize with PET-CT. They are not as well seen on today's study, perhaps due to respiratory motion. Recommend attention on follow-up. 5. Mild coronary artery calcifications. 6. Hepatic steatosis. 7. No other acute abnormalities   Electronically Signed   By: Dorise Bullion III M.D   On: 11/18/2016 15:44  Assessment: 1. Abnormal PET scan: See above, increased uptake in the anus; need to rule out carcinoma 2. Internal hemorrhoids: Grade 3 and inflamed at time of exam today 3. Rectal bleeding: Intermittent over the past "many years" per the patient, thought to be related to hemorrhoids; I am not sure what h/o proctitis the pt refers to, I cannot find this in her records and she is not on maintenance medication  Plan: 1. Scheduled patient for a colonoscopy in [place] with Dr. [name]. Did discuss risks, benefits, limitations and alternatives and the patient agrees to proceed. 2. Reviewed recommendations for hemorrhoids. 3. Patient to return to clinic per recommendations from Dr. [name] after time of procedure.  Ellouise Newer, PA-C [facility] 11/22/2016, 10:30 AM  Cc: Flossie Buffy, NP

## 2016-11-22 NOTE — Patient Instructions (Signed)

## 2016-11-23 ENCOUNTER — Institutional Professional Consult (permissible substitution): Payer: PPO | Admitting: Pulmonary Disease

## 2016-11-23 ENCOUNTER — Ambulatory Visit: Payer: PPO | Admitting: Nurse Practitioner

## 2016-11-25 ENCOUNTER — Encounter: Payer: Self-pay | Admitting: Internal Medicine

## 2016-11-27 ENCOUNTER — Telehealth: Payer: Self-pay | Admitting: Pulmonary Disease

## 2016-11-27 MED ORDER — PROMETHAZINE HCL 12.5 MG RE SUPP: 12.5000 mg | Freq: Four times a day (QID) | RECTAL | 0 refills | Status: DC | PRN

## 2016-11-27 MED ORDER — PREDNISONE 10 MG PO TABS
ORAL_TABLET | ORAL | 0 refills | Status: DC
Start: 2016-11-27 — End: 2017-03-07

## 2016-11-27 NOTE — Telephone Encounter (Signed)
Pt with chronic cough.  Now with cough causing nausea and vomiting.  She has tried mucinex, delsym, tessalon >> not much help.  Using dexilant, claritin, flonase.  Tried prednisone before and helped some.   Will send script for prednisone and PR phenergan.

## 2016-11-29 ENCOUNTER — Ambulatory Visit (INDEPENDENT_AMBULATORY_CARE_PROVIDER_SITE_OTHER): Payer: PPO | Admitting: Emergency Medicine

## 2016-11-29 ENCOUNTER — Telehealth: Payer: Self-pay | Admitting: Internal Medicine

## 2016-11-29 ENCOUNTER — Ambulatory Visit: Payer: PPO | Admitting: Emergency Medicine

## 2016-11-29 ENCOUNTER — Encounter: Payer: Self-pay | Admitting: Emergency Medicine

## 2016-11-29 ENCOUNTER — Ambulatory Visit (HOSPITAL_COMMUNITY): Payer: PPO

## 2016-11-29 DIAGNOSIS — R911 Solitary pulmonary nodule: Secondary | ICD-10-CM | POA: Diagnosis not present

## 2016-11-29 DIAGNOSIS — R05 Cough: Secondary | ICD-10-CM

## 2016-11-29 DIAGNOSIS — R059 Cough, unspecified: Secondary | ICD-10-CM

## 2016-11-29 HISTORY — DX: Solitary pulmonary nodule: R91.1

## 2016-11-29 MED ORDER — HYDROCOD POLST-CPM POLST ER 10-8 MG/5ML PO SUER: 5.0000 mL | Freq: Every evening | ORAL | 0 refills | Status: DC | PRN

## 2016-11-29 NOTE — Assessment & Plan Note (Signed)
Only mildly hypermetabolic on PET scan. I will recheck a CT chest in 3 months to look for interval change. If present then she would potentially be a good candidate for primary resection. Alternatively we can do a transthoracic needle biopsy.

## 2016-11-29 NOTE — Progress Notes (Signed)
Subjective:    Patient ID: [name] [name], female    DOB: Apr 06, 1960, 56 y.o.   MRN: [id number]  Cough  Associated symptoms include shortness of breath. Pertinent negatives include no ear pain, eye redness, fever, headaches, postnasal drip, rash, rhinorrhea, sore throat or wheezing.   56 year old smoker (20+ pack years), history of obesity, GERD. She has been followed in the past in our office for chronic cough. This was felt to be mostly upper airway in nature, was ascribed to GERD / chronic LPR. She was treated with cough suppression, narcotic cough syrup. She was on Reglan, not currently. She is currently on Dexilant. Then beginning about 1 yr ago she started to have more and more cough, has evolved paroxysms that are associated with SOB. She wonders if her reflux id well controlled, also notes that her Norco was decreased about 1 year ago. She wonders if that is part of the reason. She has not benefited from tessalon in the past. She is on fluticasone NS qd. Cough usually non-productive, occasionally thick white. She is occasionally SOB with exertion, household chores.   She underwent a sleep study 03/09/14 that show evidence for obstructive sleep apnea. I don't see any in PFT testing in the chart.  ROV 11/29/16 -- Follow-up visit for patient with a history of tobacco use, obesity, GERD. I saw her for chronic cough last month. Felt to be largely upper airway in nature with contributions from GERD and allergic rhinitis. We performed chest x-ray 9/17 that showed suspected right upper lobe nodule, confirmed on CT scan of the chest 9/19. I have reviewed there is a 2.1 cm lobular nodule in the peripheral right upper lobe. Also noted are smaller bilateral pulmonary nodules 2-4 mm in size. Subsequent PET scan on 9/27 also reviewed. This shows low level uptake (SUV 2.5) of the 2.1 x 1.4 cm nodule as well as intense uptake in the anus without a clear CT scan correlate. She has CSY arranged for 10/18 to eval  hypermetabolism in the ano/rectal area. She had PFT that I have reviewed > normal.   She continues to have some cough. She was started on prednisone over the weekend for persistent cough. She is using benadryl qd, hasn't started loratadine yet. She is using tussionex qhs  Review of Systems  Constitutional: Negative for fever and unexpected weight change.  HENT: Negative for congestion, dental problem, ear pain, nosebleeds, postnasal drip, rhinorrhea, sinus pressure, sneezing, sore throat and trouble swallowing.   Eyes: Negative for redness and itching.  Respiratory: Positive for cough and shortness of breath. Negative for chest tightness and wheezing.   Cardiovascular: Negative for palpitations and leg swelling.  Gastrointestinal: Negative for nausea and vomiting.  Genitourinary: Negative for dysuria.  Musculoskeletal: Negative for joint swelling.  Skin: Negative for rash.  Neurological: Negative for headaches.  Hematological: Does not bruise/bleed easily.  Psychiatric/Behavioral: Negative for dysphoric mood. The patient is not nervous/anxious.         Objective:   Physical Exam  Vitals:   11/29/16 1118  BP: 116/86  Pulse: 80  SpO2: 97%  Weight: 197 lb (89.4 kg)  Height: 5\' 5"  (1.651 m)   Gen: Pleasant, well-nourished, in no distress,  normal affect  ENT: No lesions,  mouth clear,  oropharynx clear, no postnasal drip  Neck: No JVD, no TMG, no carotid bruits  Lungs: No use of accessory muscles, clear without rales or  rhonchi  Cardiovascular: RRR, heart sounds normal, no murmur or gallops, no peripheral edema  Musculoskeletal: No deformities, no cyanosis or clubbing  Neuro: alert, non focal  Skin: Warm, no lesions or rashes     Assessment & Plan:  Pulmonary nodule, right Only mildly hypermetabolic on PET scan. I will recheck a CT chest in 3 months to look for interval change. If present then she would potentially be a good candidate for primary resection. Alternatively we can do a transthoracic needle biopsy.   Cough Please take your prednisone taper as directed Continue Benadryl as you have been taking it Start loratadine 10 mg daily as planned We will refill tussionex. Use 1 tablespoon at bedtime as needed for cough  Baltazar Apo, MD, PhD 11/29/2016, 11:48 AM  [facility] [phone] or if no answer [phone]

## 2016-11-29 NOTE — Telephone Encounter (Signed)
The pt has been advised to keep appt for colon and call if bleeding becomes heavy

## 2016-11-29 NOTE — Patient Instructions (Addendum)
We will repeat your CT chest in late December 2018 to compare with priors.  Get your colonoscopy as planned.  Please take your prednisone taper as directed Continue Benadryl as you have been taking it Start loratadine 10 mg daily as planned We will refill tussionex. Use 1 tablespoon at bedtime as needed for cough Follow with Dr [name] in December to review your CT scan results

## 2016-11-29 NOTE — Telephone Encounter (Signed)
I advised [name] [name] that at this time we do not have an appointment available sooner. I did put her on the wait list and promised to call her if there is a cancellation. She appreciates that.

## 2016-11-29 NOTE — Assessment & Plan Note (Signed)
Please take your prednisone taper as directed Continue Benadryl as you have been taking it Start loratadine 10 mg daily as planned We will refill tussionex. Use 1 tablespoon at bedtime as needed for cough

## 2016-11-29 NOTE — Telephone Encounter (Signed)
Calling back to see if she can be seen in the office by anybody. She also says she has some left over medication and wants to ask the nurse if she can take some medication she still has form the last time she was having gastro problems.

## 2016-11-29 NOTE — Telephone Encounter (Signed)
Left message on machine to call back  

## 2016-12-01 DIAGNOSIS — F319 Bipolar disorder, unspecified: Secondary | ICD-10-CM | POA: Diagnosis not present

## 2016-12-08 DIAGNOSIS — M5416 Radiculopathy, lumbar region: Secondary | ICD-10-CM | POA: Diagnosis not present

## 2016-12-08 DIAGNOSIS — Z79891 Long term (current) use of opiate analgesic: Secondary | ICD-10-CM | POA: Diagnosis not present

## 2016-12-08 DIAGNOSIS — M47817 Spondylosis without myelopathy or radiculopathy, lumbosacral region: Secondary | ICD-10-CM | POA: Diagnosis not present

## 2016-12-08 DIAGNOSIS — G894 Chronic pain syndrome: Secondary | ICD-10-CM | POA: Diagnosis not present

## 2016-12-09 ENCOUNTER — Ambulatory Visit: Payer: PPO | Admitting: Emergency Medicine

## 2016-12-09 ENCOUNTER — Ambulatory Visit (AMBULATORY_SURGERY_CENTER): Payer: PPO | Admitting: Internal Medicine

## 2016-12-09 ENCOUNTER — Encounter: Payer: Self-pay | Admitting: Internal Medicine

## 2016-12-09 ENCOUNTER — Telehealth: Payer: Self-pay | Admitting: Internal Medicine

## 2016-12-09 VITALS — BP 117/64 | HR 65 | Temp 97.7°F | Resp 13 | Ht 65.0 in | Wt 199.0 lb

## 2016-12-09 DIAGNOSIS — Z5181 Encounter for therapeutic drug level monitoring: Secondary | ICD-10-CM | POA: Diagnosis not present

## 2016-12-09 DIAGNOSIS — D123 Benign neoplasm of transverse colon: Secondary | ICD-10-CM

## 2016-12-09 DIAGNOSIS — M7060 Trochanteric bursitis, unspecified hip: Secondary | ICD-10-CM | POA: Diagnosis not present

## 2016-12-09 DIAGNOSIS — M5136 Other intervertebral disc degeneration, lumbar region: Secondary | ICD-10-CM | POA: Diagnosis not present

## 2016-12-09 DIAGNOSIS — R933 Abnormal findings on diagnostic imaging of other parts of digestive tract: Secondary | ICD-10-CM | POA: Diagnosis not present

## 2016-12-09 DIAGNOSIS — K625 Hemorrhage of anus and rectum: Secondary | ICD-10-CM | POA: Diagnosis not present

## 2016-12-09 DIAGNOSIS — G894 Chronic pain syndrome: Secondary | ICD-10-CM | POA: Diagnosis not present

## 2016-12-09 DIAGNOSIS — D124 Benign neoplasm of descending colon: Secondary | ICD-10-CM

## 2016-12-09 DIAGNOSIS — K648 Other hemorrhoids: Secondary | ICD-10-CM | POA: Diagnosis not present

## 2016-12-09 DIAGNOSIS — R948 Abnormal results of function studies of other organs and systems: Secondary | ICD-10-CM

## 2016-12-09 MED ORDER — SODIUM CHLORIDE 0.9 % IV SOLN: 500.0000 mL | INTRAVENOUS | Status: DC

## 2016-12-09 NOTE — Telephone Encounter (Signed)
Pt. Called wanting to know if it is ok to use Preparation H this morning since her procedure is this afternoon.  Her hemorrhoids have been bothering her since taking prep and going to restroom.  I advised that it is okay to use. All questions were answered.

## 2016-12-09 NOTE — Op Note (Signed)
[facility] Patient Name: [name] [name] Procedure Date: 12/09/2016 4:13 PM MRN: [id number] Endoscopist: Docia Chuck. [name] , MD Age: 56 Referring MD:  Date of Birth: 26-Mar-1960 Gender: Female Account #: [id number] Procedure:                Colonoscopy, with cold snare polypectomy X4 Indications:              Rectal bleeding, Abnormal PET scan of the GI                            tract(anus). Previous colonoscopy with Dr.                            Sharlett Iles February 2013 with diverticulosis Medicines:                Monitored Anesthesia Care Procedure:                Pre-Anesthesia Assessment:                           - Prior to the procedure, a History and Physical                            was performed, and patient medications and                            allergies were reviewed. The patient's tolerance of                            previous anesthesia was also reviewed. The risks                            and benefits of the procedure and the sedation                            options and risks were discussed with the patient.                            All questions were answered, and informed consent                            was obtained. Prior Anticoagulants: The patient has                            taken no previous anticoagulant or antiplatelet                            agents. ASA Grade Assessment: III - A patient with                            severe systemic disease. After reviewing the risks                            and benefits, the patient was deemed in  satisfactory condition to undergo the procedure.                           After obtaining informed consent, the colonoscope                            was passed under direct vision. Throughout the                            procedure, the patient's blood pressure, pulse, and                            oxygen saturations were monitored continuously. The        Colonoscope was introduced through the anus and                            advanced to the the cecum, identified by                            appendiceal orifice and ileocecal valve. The                            ileocecal valve, appendiceal orifice, and rectum                            were photographed. The quality of the bowel                            preparation was excellent. The colonoscopy was                            performed without difficulty. The patient tolerated                            the procedure well. The bowel preparation used was                            SUPREP. Scope In: 4:21:21 PM Scope Out: 4:36:07 PM Scope Withdrawal Time: 0 hours 7 minutes 50 seconds  Total Procedure Duration: 0 hours 14 minutes 46 seconds  Findings:                 Four polyps were found in the descending colon and                            transverse colon. The polyps were 4 to 6 mm in                            size. These polyps were removed with a cold snare.                            Resection and retrieval were complete.  Scattered small-mouthed diverticula were found in                            the entire colon.                           External and internal hemorrhoids were found during                            retroflexion. These were moderate-sized and                            somewhat inflamed.                           The exam was otherwise without abnormality on                            direct and retroflexion views. Complications:            No immediate complications. Estimated blood loss:                            None. Estimated Blood Loss:     Estimated blood loss: none. Impression:               - Four 4 to 6 mm polyps in the descending colon and                            in the transverse colon, removed with a cold snare.                            Resected and retrieved.                           - Diverticulosis in  the entire examined colon.                           - External and internal hemorrhoids. Hemorrhoids as                            the cause for her abnormal finding on PET scan.                           - The examination was otherwise normal on direct                            and retroflexion views. Recommendation:           - Repeat colonoscopy in 3 years for surveillance                            (multiple adenomas today).                           - Patient has a contact number available for  emergencies. The signs and symptoms of potential                            delayed complications were discussed with the                            patient. Return to normal activities tomorrow.                            Written discharge instructions were provided to the                            patient.                           - Resume previous diet.                           - Continue present medications.                           - Await pathology results. Docia Chuck. [name], MD 12/09/2016 4:43:25 PM This report has been signed electronically.

## 2016-12-09 NOTE — Patient Instructions (Signed)
Handouts given : Polyps, Diverticulosis and Hemorrhoids.   YOU HAD AN ENDOSCOPIC PROCEDURE TODAY AT THE [facility]:   Refer to the procedure report that was given to you for any specific questions about what was found during the examination.  If the procedure report does not answer your questions, please call your gastroenterologist to clarify.  If you requested that your care partner not be given the details of your procedure findings, then the procedure report has been included in a sealed envelope for you to review at your convenience later.  YOU SHOULD EXPECT: Some feelings of bloating in the abdomen. Passage of more gas than usual.  Walking can help get rid of the air that was put into your GI tract during the procedure and reduce the bloating. If you had a lower endoscopy (such as a colonoscopy or flexible sigmoidoscopy) you may notice spotting of blood in your stool or on the toilet paper. If you underwent a bowel prep for your procedure, you may not have a normal bowel movement for a few days.  Please Note:  You might notice some irritation and congestion in your nose or some drainage.  This is from the oxygen used during your procedure.  There is no need for concern and it should clear up in a day or so.  SYMPTOMS TO REPORT IMMEDIATELY:   Following lower endoscopy (colonoscopy or flexible sigmoidoscopy):  Excessive amounts of blood in the stool  Significant tenderness or worsening of abdominal pains  Swelling of the abdomen that is new, acute  Fever of 100F or higher   For urgent or emergent issues, a gastroenterologist can be reached at any hour by calling [phone].   DIET:  We do recommend a small meal at first, but then you may proceed to your regular diet.  Drink plenty of fluids but you should avoid alcoholic beverages for 24 hours.  ACTIVITY:  You should plan to take it easy for the rest of today and you should NOT DRIVE or use heavy machinery until  tomorrow (because of the sedation medicines used during the test).    FOLLOW UP: Our staff will call the number listed on your records the next business day following your procedure to check on you and address any questions or concerns that you may have regarding the information given to you following your procedure. If we do not reach you, we will leave a message.  However, if you are feeling well and you are not experiencing any problems, there is no need to return our call.  We will assume that you have returned to your regular daily activities without incident.  If any biopsies were taken you will be contacted by phone or by letter within the next 1-3 weeks.  Please call us at [phone] if you have not heard about the biopsies in 3 weeks.    SIGNATURES/CONFIDENTIALITY: You and/or your care partner have signed paperwork which will be entered into your electronic medical record.  These signatures attest to the fact that that the information above on your After Visit Summary has been reviewed and is understood.  Full responsibility of the confidentiality of this discharge information lies with you and/or your care-partner. 

## 2016-12-09 NOTE — Progress Notes (Signed)
Called to room to assist during endoscopic procedure.  Patient ID and intended procedure confirmed with present staff. Received instructions for my participation in the procedure from the performing physician.  

## 2016-12-09 NOTE — Progress Notes (Signed)
To PACU, VSS. Report to [email or web address] 

## 2016-12-09 NOTE — Progress Notes (Signed)
Pt. Reports no change in her medical or surgical history since pre-visit 11/22/2016.

## 2016-12-10 ENCOUNTER — Telehealth: Payer: Self-pay

## 2016-12-10 NOTE — Telephone Encounter (Signed)
  Follow up Call-  Call Nylani Michetti number 12/09/2016  Post procedure Call Shandell Jallow phone  # [phone]  Permission to leave phone message Yes  Some recent data might be hidden     Patient questions:  Do you have a fever, pain , or abdominal swelling? No. Pain Score  0 *  Have you tolerated food without any problems? Yes.    Have you been able to return to your normal activities? Yes.    Do you have any questions about your discharge instructions: Diet   No. Medications  No. Follow up visit  No.  Do you have questions or concerns about your Care? No.  Actions: * If pain score is 4 or above: No action needed, pain <4.

## 2016-12-14 ENCOUNTER — Telehealth: Payer: Self-pay | Admitting: Internal Medicine

## 2016-12-14 ENCOUNTER — Encounter: Payer: Self-pay | Admitting: Internal Medicine

## 2016-12-14 NOTE — Telephone Encounter (Signed)
Precancerous benign polyps. We told her that we would send her a letter with the results. We will do so. She will need recall colonoscopy in 3 years as I mentioned on her report a few days ago

## 2016-12-14 NOTE — Telephone Encounter (Signed)
Spoke with pt and she is aware.

## 2016-12-14 NOTE — Telephone Encounter (Signed)
Patient calling for path results, please advise.

## 2016-12-20 ENCOUNTER — Telehealth: Payer: Self-pay | Admitting: Emergency Medicine

## 2016-12-20 ENCOUNTER — Other Ambulatory Visit: Payer: Self-pay | Admitting: Pulmonary Disease

## 2016-12-20 NOTE — Telephone Encounter (Signed)
Please let her know that she really shouldn't take the tussionex any more frequently than q12h.

## 2016-12-20 NOTE — Telephone Encounter (Signed)
Spoke with pt, who states she take tussionex 51ml at 10pm. Pt states at 3am she wakes coughing until she vomits. Pt is scheduled for OV with RB on 12/23/16. Pt would like to know if she can take 62ml of Tussionex every 6-8 hours until her visit. Pt states she has 45ml left. Pt also reports of increased sob and chest tightness.   RB please advise. Thanks.   No Known Allergies

## 2016-12-20 NOTE — Telephone Encounter (Signed)
Pt is aware of RB's recommendations and voiced her understanding. Nothing further needed.

## 2016-12-21 ENCOUNTER — Telehealth: Payer: Self-pay | Admitting: Internal Medicine

## 2016-12-23 ENCOUNTER — Encounter: Payer: Self-pay | Admitting: Emergency Medicine

## 2016-12-23 ENCOUNTER — Ambulatory Visit (INDEPENDENT_AMBULATORY_CARE_PROVIDER_SITE_OTHER): Payer: PPO | Admitting: Emergency Medicine

## 2016-12-23 DIAGNOSIS — R911 Solitary pulmonary nodule: Secondary | ICD-10-CM | POA: Diagnosis not present

## 2016-12-23 DIAGNOSIS — R112 Nausea with vomiting, unspecified: Secondary | ICD-10-CM | POA: Diagnosis not present

## 2016-12-23 DIAGNOSIS — R05 Cough: Secondary | ICD-10-CM

## 2016-12-23 DIAGNOSIS — R059 Cough, unspecified: Secondary | ICD-10-CM

## 2016-12-23 MED ORDER — HYDROCODONE-HOMATROPINE 5-1.5 MG/5ML PO SYRP: 5.0000 mL | ORAL_SOLUTION | Freq: Four times a day (QID) | ORAL | 0 refills | Status: DC | PRN

## 2016-12-23 MED ORDER — BENZONATATE 200 MG PO CAPS: 200.0000 mg | ORAL_CAPSULE | Freq: Four times a day (QID) | ORAL | 1 refills | Status: DC | PRN

## 2016-12-23 NOTE — Patient Instructions (Addendum)
Please continue loratadine daily, flonase as you are using them Try starting nasal saline washes daily to see if this helps with drainage and congestion.  Continue Dexilant as you are taking it.  We may need to refer you to see an allergist in the future.  We will repeat your CT scan in December 2018 as planned.  Follow with Dr [name] regarding your reflux, nausea and vomiting Stop tussionex for now We will use hycodan 5cc up to every 6 hours if needed for cough.  Try using tessalon perles 200mg  up to every 6 hours if needed to suppress cough.  You need to stop smoking.  Follow with Dr [name] in 2 months or sooner if you have any problems.

## 2016-12-23 NOTE — Progress Notes (Signed)
Subjective:    Patient ID: [name] [name], female    DOB: 1960/12/08, 56 y.o.   MRN: [id number]  Cough  Associated symptoms include shortness of breath. Pertinent negatives include no ear pain, eye redness, fever, headaches, postnasal drip, rash, rhinorrhea, sore throat or wheezing.   56 year old smoker (20+ pack years), history of obesity, GERD. She has been followed in the past in our office for chronic cough. This was felt to be mostly upper airway in nature, was ascribed to GERD / chronic LPR. She was treated with cough suppression, narcotic cough syrup. She was on Reglan, not currently. She is currently on Dexilant. Then beginning about 1 yr ago she started to have more and more cough, has evolved paroxysms that are associated with SOB. She wonders if her reflux id well controlled, also notes that her Norco was decreased about 1 year ago. She wonders if that is part of the reason. She has not benefited from tessalon in the past. She is on fluticasone NS qd. Cough usually non-productive, occasionally thick white. She is occasionally SOB with exertion, household chores.   She underwent a sleep study 03/09/14 that show evidence for obstructive sleep apnea. I don't see any in PFT testing in the chart.  ROV 11/29/16 -- Follow-up visit for patient with a history of tobacco use, obesity, GERD. I saw her for chronic cough last month. Felt to be largely upper airway in nature with contributions from GERD and allergic rhinitis. We performed chest x-ray 9/17 that showed suspected right upper lobe nodule, confirmed on CT scan of the chest 9/19. I have reviewed there is a 2.1 cm lobular nodule in the peripheral right upper lobe. Also noted are smaller bilateral pulmonary nodules 2-4 mm in size. Subsequent PET scan on 9/27 also reviewed. This shows low level uptake (SUV 2.5) of the 2.1 x 1.4 cm nodule as well as intense uptake in the anus without a clear CT scan correlate. She has CSY arranged for 10/18 to eval  hypermetabolism in the ano/rectal area. She had PFT that I have reviewed > normal.   She continues to have some cough. She was started on prednisone over the weekend for persistent cough. She is using benadryl qd, hasn't started loratadine yet. She is using tussionex qhs                ROV 12/23/16 --this is a follow-up visit for patient with a history of tobacco use, obesity, GERD and chronic cough that appears to be upper airway in nature.  Her spirometry is normal.  Currently smoking approximately 2-3 a day.  she also has a 2.1 cm lobular right upper lobe nodule that is only minimally hypermetabolic on PET scan.  She underwent colonoscopy because there was an area of hypermetabolism in the perianal region on the PET scan.  No associated anatomical correlate was found but she did have some precancerous polyps that were removed.  She has continued to have severe cough often waking her at night.  She has experienced proximal isms and posttussive emesis. She is on loratadine, dexlansoprazole, flonase.  Review of Systems  Constitutional: Negative for fever and unexpected weight change.  HENT: Negative for congestion, dental problem, ear pain, nosebleeds, postnasal drip, rhinorrhea, sinus pressure, sneezing, sore throat and trouble swallowing.   Eyes: Negative for redness and itching.  Respiratory: Positive for cough and shortness of breath. Negative for chest tightness and wheezing.   Cardiovascular: Negative for palpitations and leg swelling.  Gastrointestinal: Negative for nausea and vomiting.    Genitourinary: Negative for dysuria.  Musculoskeletal: Negative for joint swelling.  Skin: Negative for rash.  Neurological: Negative for headaches.  Hematological: Does not bruise/bleed easily.  Psychiatric/Behavioral: Negative for dysphoric mood. The patient is not nervous/anxious.         Objective:   Physical Exam  Vitals:   12/23/16 1143  BP: 130/90  Pulse: 76  SpO2: 98%  Weight: 202 lb (91.6 kg)  Height: 5\' 5"  (1.651 m)   Gen: Pleasant, well-nourished, in no distress,  normal affect  ENT: No lesions,  mouth clear,  oropharynx clear, no postnasal drip  Neck: No JVD, no TMG, no carotid bruits  Lungs: No use of accessory muscles, clear without rales or rhonchi  Cardiovascular: RRR, heart sounds normal, no murmur or gallops, no peripheral edema  Musculoskeletal: No deformities, no cyanosis or clubbing  Neuro: alert, non focal  Skin: Warm, no lesions or rashes     Assessment & Plan:  Pulmonary nodule, right We will repeat your CT scan in December 2018 as planned.   Cough With apparent contributions from allergic rhinitis and suspected I say this mainly based on the predilection for her coughing paroxysms to happen at night, often wake her.  Often result in posttussive emesis.  Please continue loratadine daily, flonase as you are using them Try starting nasal saline washes daily to see if this helps with drainage and congestion.  Continue Dexilant as you are taking it.  We may need to refer you to see an allergist in the future.  Stop tussionex for now We will use hycodan 5cc up to every 6 hours if needed for cough.   NAUSEA AND VOMITING She is planning to follow-up with Dr. [name].  Unclear to me whether this is posttussive emesis.  It seems to happen at other times, not always involved with coughing.  It apparently can also cause cough (as opposed to resulting from cough).  Baltazar Apo, MD, PhD 12/23/2016, 12:07 PM [facility] [phone] or if no answer [phone]

## 2016-12-23 NOTE — Assessment & Plan Note (Signed)
We will repeat your CT scan in December 2018 as planned.

## 2016-12-23 NOTE — Assessment & Plan Note (Signed)
She is planning to follow-up with Dr. [name].  Unclear to me whether this is posttussive emesis.  It seems to happen at other times, not always involved with coughing.  It apparently can also cause cough (as opposed to resulting from cough).

## 2016-12-23 NOTE — Assessment & Plan Note (Signed)
With apparent contributions from allergic rhinitis and suspected I say this mainly based on the predilection for her coughing paroxysms to happen at night, often wake her.  Often result in posttussive emesis.  Please continue loratadine daily, flonase as you are using them Try starting nasal saline washes daily to see if this helps with drainage and congestion.  Continue Dexilant as you are taking it.  We may need to refer you to see an allergist in the future.  Stop tussionex for now We will use hycodan 5cc up to every 6 hours if needed for cough.

## 2016-12-23 NOTE — Telephone Encounter (Signed)
Pt is calling back about her medication refill

## 2016-12-24 NOTE — Telephone Encounter (Signed)
Left message that Dr. [name] had previously expressed that he wanted patient to get her phenergan suppositories from her PCP.

## 2016-12-27 ENCOUNTER — Ambulatory Visit: Payer: PPO | Admitting: Nurse Practitioner

## 2016-12-27 ENCOUNTER — Encounter: Payer: Self-pay | Admitting: Nurse Practitioner

## 2016-12-27 VITALS — BP 124/76 | HR 74 | Temp 98.4°F | Ht 65.0 in | Wt 201.0 lb

## 2016-12-27 DIAGNOSIS — K219 Gastro-esophageal reflux disease without esophagitis: Secondary | ICD-10-CM | POA: Diagnosis not present

## 2016-12-27 DIAGNOSIS — Z716 Tobacco abuse counseling: Secondary | ICD-10-CM | POA: Diagnosis not present

## 2016-12-27 MED ORDER — VARENICLINE TARTRATE 0.5 MG X 11 & 1 MG X 42 PO MISC: ORAL | 0 refills | Status: DC

## 2016-12-27 NOTE — Progress Notes (Signed)
Subjective:  Patient ID: [name] [name], female    DOB: 05-29-1960  Age: 56 y.o. MRN: [id number]  CC: Follow-up (coughing so hard--vomitting and acid reflux problem, GI told [name] [name] to follow up with PCP/ stop smoking consult)   HPI [name] [name] presents to discuss need for promethazine Rx. She report vominting with coughing episode. She denies any nausea prior to vomiting. She states cough medication was prescribed by pulmonology. She is taking PPI as prescribed but has not made changes to diet. She also admits to eating late at night and going straight to bed.  Tobacco Cessation: Previous Rx was not used, hence she will like to get another one.  Outpatient Medications Prior to Visit  Medication Sig Dispense Refill  . ARIPiprazole (ABILIFY) 30 MG tablet Take 30 mg by mouth daily.     Marland Kitchen atorvastatin (LIPITOR) 40 MG tablet take 1 tablet by mouth at bedtime ON MONDAY, WEDNESDAY AND FRIDAY 90 tablet 2  . benzonatate (TESSALON) 200 MG capsule Take 1 capsule (200 mg total) by mouth every 6 (six) hours as needed for cough. 120 capsule 1  . Dexlansoprazole 30 MG capsule Take 1 capsule (30 mg total) by mouth daily. 90 capsule 2  . diclofenac sodium (VOLTAREN) 1 % GEL Apply 2 g topically 4 (four) times daily.     Marland Kitchen FLUoxetine (PROZAC) 20 MG capsule     . fluticasone (FLONASE) 50 MCG/ACT nasal spray instill 2 sprays into each nostril once daily 16 g 5  . HYDROcodone-acetaminophen (NORCO/VICODIN) 5-325 MG tablet Take 1 tablet by mouth every 6 (six) hours as needed for moderate pain. x3 daily PRN    . HYDROcodone-homatropine (HYCODAN) 5-1.5 MG/5ML syrup Take 5 mLs by mouth every 6 (six) hours as needed for cough. 240 mL 0  . hydrOXYzine (VISTARIL) 25 MG capsule Take 25 mg by mouth 2 (two) times daily.  0  . lamoTRIgine (LAMICTAL) 100 MG tablet Take 100 mg by mouth daily. Patient taking x2 daily    . losartan (COZAAR) 50 MG tablet Take 1 tablet (50 mg total) by mouth daily. 90 tablet 2  . methocarbamol  (ROBAXIN) 500 MG tablet Take 500 mg by mouth 2 (two) times daily.    . Multiple Vitamin (MULTIVITAMIN WITH MINERALS) TABS tablet Take 1 tablet by mouth daily. 30 tablet 0  . predniSONE (DELTASONE) 10 MG tablet 3 pills daily for 2 days, 2 pills daily for 2 days, 1 pill daily for 2 days 12 tablet 0  . promethazine (PHENERGAN) 12.5 MG suppository Place 1 suppository (12.5 mg total) rectally every 6 (six) hours as needed for nausea or vomiting. 12 each 0  . varenicline (CHANTIX PAK) 0.5 MG X 11 & 1 MG X 42 tablet Take one 0.5 mg tablet by mouth once daily for 3 days, then increase to one 0.5 mg tablet twice daily for 4 days, then increase to one 1 mg tablet twice daily. 53 tablet 0  . chlorpheniramine-HYDROcodone (TUSSIONEX PENNKINETIC ER) 10-8 MG/5ML SUER Take 5 mLs by mouth at bedtime as needed for cough. (Patient not taking: Reported on 12/27/2016) 240 mL 0   No facility-administered medications prior to visit.     ROS See HPI  Objective:  BP 124/76   Pulse 74   Temp 98.4 F (36.9 C)   Ht 5\' 5"  (1.651 m)   Wt 201 lb (91.2 kg)   SpO2 98%   BMI 33.45 kg/m   BP Readings from Last 3 Encounters:  12/27/16 124/76  12/23/16 130/90  12/09/16 117/64    Wt Readings from Last 3 Encounters:  12/27/16 201 lb (91.2 kg)  12/23/16 202 lb (91.6 kg)  12/09/16 199 lb (90.3 kg)    Physical Exam  Constitutional: She is oriented to person, place, and time.  Abdominal: Soft. She exhibits no distension. There is no tenderness.  Neurological: She is alert and oriented to person, place, and time.    Lab Results  Component Value Date   WBC 7.9 10/30/2015   HGB 13.1 10/30/2015   HCT 37.9 10/30/2015   PLT 268.0 10/30/2015   GLUCOSE 162 (H) 06/24/2016   CHOL 308 (H) 06/24/2016   TRIG 104.0 06/24/2016   HDL 135.10 06/24/2016   LDLDIRECT 192.5 06/08/2012   LDLCALC 152 (H) 06/24/2016   ALT 59 (H) 10/30/2015   AST 66 (H) 10/30/2015   NA 136 06/24/2016   K 4.8 06/24/2016   CL 97 06/24/2016    CREATININE 0.97 06/24/2016   BUN 19 06/24/2016   CO2 29 06/24/2016   TSH 2.70 10/30/2015   HGBA1C 5.9 06/24/2016    Nm Pet Image Initial (pi) Skull Base To Thigh  Result Date: 11/18/2016 CLINICAL DATA:  Pulmonary nodule evaluation. EXAM: NUCLEAR MEDICINE PET SKULL BASE TO THIGH TECHNIQUE: 9.4 mCi F-18 FDG was injected intravenously. Full-ring PET imaging was performed from the skull base to thigh after the radiotracer. CT data was obtained and used for attenuation correction and anatomic localization. FASTING BLOOD GLUCOSE:  Value: 111 mg/dl COMPARISON:  None. FINDINGS: NECK: Uptake at the floor of the mouth near midline on image 29 with no CT correlate is identified. No other abnormal uptake in the head or neck regions. CHEST: The nodule in the right upper lobe seen on recent chest x-ray and CT imaging is unchanged in size measuring approximately 2 x 1.4 cm when accounting for difference in slice selection. There is low level uptake in this nodule with a maximum SUV of 2.5. No other abnormal FDG uptake within the chest. ABDOMEN/PELVIS: There is increased uptake in the anus with no CT correlate. The maximum SUV is 20. No other abnormal uptake seen in the soft tissues of the abdomen or pelvis. SKELETON: No focal hypermetabolic activity to suggest skeletal metastasis. IMPRESSION: 1. The 2.1 x 1.4 cm nodule in the right upper lobe demonstrates low level FDG uptake with a maximum SUV of 2.5. This level of uptake is equivocal and a low-grade neoplasm/malignancy is not excluded. This level of uptake can be seen with benign etiologies however. The etiology could be confirmed with tissue sampling. 2. Intense uptake in the anus without CT correlate. While the level of normal uptake in the anus is widely variable on PET-CT imaging, the degree of uptake on today's study is more than typically seen. An underlying anal carcinoma is not excluded. Recommend correlation with physical exam. 3. Uptake in the floor of the  mouth near midline is favored to be physiologic. Recommend physical exam in this region. 4. The tiny pulmonary nodules in the lung bases described on the recent CT of the chest are too small to characterize with PET-CT. They are not as well seen on today's study, perhaps due to respiratory motion. Recommend attention on follow-up. 5. Mild coronary artery calcifications. 6. Hepatic steatosis. 7. No other acute abnormalities Electronically Signed   By: Dorise Bullion III M.D   On: 11/18/2016 15:44    Assessment & Plan:   [name] [name] was seen today for follow-up.  Diagnoses and all orders for  this visit:  Gastroesophageal reflux disease without esophagitis  Encounter for smoking cessation counseling -     varenicline (CHANTIX PAK) 0.5 MG X 11 & 1 MG X 42 tablet; Take one 0.5 mg tablet by mouth once daily for 3 days, then increase to one 0.5 mg tablet twice daily for 4 days, then increase to one 1 mg tablet twice daily.   I have discontinued [name] [name]'s promethazine and chlorpheniramine-HYDROcodone. I am also having [name] [name] maintain [name] [name] lamoTRIgine, multivitamin with minerals, HYDROcodone-acetaminophen, ARIPiprazole, diclofenac sodium, hydrOXYzine, methocarbamol, atorvastatin, Dexlansoprazole, fluticasone, losartan, predniSONE, FLUoxetine, benzonatate, HYDROcodone-homatropine, and varenicline.  Meds ordered this encounter  Medications  . varenicline (CHANTIX PAK) 0.5 MG X 11 & 1 MG X 42 tablet    Sig: Take one 0.5 mg tablet by mouth once daily for 3 days, then increase to one 0.5 mg tablet twice daily for 4 days, then increase to one 1 mg tablet twice daily.    Dispense:  53 tablet    Refill:  0    Order Specific Question:   Supervising Provider    Answer:   Cassandria Anger [[id number]]    Follow-up: No Follow-up on file.  Wilfred Lacy, NP

## 2016-12-27 NOTE — Patient Instructions (Signed)
Since vomiting is triggered by uncontrolled cough, please use cough medication as prescribed. Promethazine Rx not needed at this time.  It is also important not to eat -hrs prior to laying down. Also follow Gerd Diet to improve symptoms.   Food Choices for Gastroesophageal Reflux Disease, Adult When you have gastroesophageal reflux disease (GERD), the foods you eat and your eating habits are very important. Choosing the right foods can help ease your discomfort. What guidelines do I need to follow?  Choose fruits, vegetables, whole grains, and low-fat dairy products.  Choose low-fat meat, fish, and poultry.  Limit fats such as oils, salad dressings, butter, nuts, and avocado.  Keep a food diary. This helps you identify foods that cause symptoms.  Avoid foods that cause symptoms. These may be different for everyone.  Eat small meals often instead of 3 large meals a day.  Eat your meals slowly, in a place where you are relaxed.  Limit fried foods.  Cook foods using methods other than frying.  Avoid drinking alcohol.  Avoid drinking large amounts of liquids with your meals.  Avoid bending over or lying down until 2-3 hours after eating. What foods are not recommended? These are some foods and drinks that may make your symptoms worse: Vegetables Tomatoes. Tomato juice. Tomato and spaghetti sauce. Chili peppers. Onion and garlic. Horseradish. Fruits Oranges, grapefruit, and lemon (fruit and juice). Meats High-fat meats, fish, and poultry. This includes hot dogs, ribs, ham, sausage, salami, and bacon. Dairy Whole milk and chocolate milk. Sour cream. Cream. Butter. Ice cream. Cream cheese. Drinks Coffee and tea. Bubbly (carbonated) drinks or energy drinks. Condiments Hot sauce. Barbecue sauce. Sweets/Desserts Chocolate and cocoa. Donuts. Peppermint and spearmint. Fats and Oils High-fat foods. This includes [place] fries and potato chips. Other Vinegar. Strong spices. This  includes black pepper, white pepper, red pepper, cayenne, curry powder, cloves, ginger, and chili powder. The items listed above may not be a complete list of foods and drinks to avoid. Contact your dietitian for more information. This information is not intended to replace advice given to you by your health care provider. Make sure you discuss any questions you have with your health care provider. Document Released: 08/10/2011 Document Revised: 07/17/2015 Document Reviewed: 12/13/2012 Elsevier Interactive Patient Education  2017 [employer].

## 2017-01-02 ENCOUNTER — Telehealth: Payer: Self-pay | Admitting: Internal Medicine

## 2017-01-02 MED ORDER — PROMETHAZINE HCL 12.5 MG RE SUPP: 12.5000 mg | Freq: Four times a day (QID) | RECTAL | 0 refills | Status: DC | PRN

## 2017-01-02 NOTE — Telephone Encounter (Signed)
Patient contacted me to report ongoing issues with nausea. Recent colonoscopy with Dr. [name] with removal of several small adenomatous polyps.  This study showed diverticulosis and hemorrhoids. In the last several days she has had severe issues with nausea and nonbloody emesis.  Having a hard time keeping down solid food but is able to take liquids Requesting to be seen in the office and something for nausea until that time She had a small amount of red blood with last bowel movement and she feels this was hemorrhoidal in nature.  No hematochezia or melena.  Will prescribe Phenergan suppository, which she states works better than Zofran for her. Limited supply to [name] [name] until she can be seen  Please contact the patient to schedule a visit with advanced practitioner or Dr. [name]

## 2017-01-03 ENCOUNTER — Other Ambulatory Visit: Payer: Self-pay | Admitting: Emergency Medicine

## 2017-01-03 NOTE — Telephone Encounter (Signed)
Spoke with pt, she states she is still coughing and would like a refill on the Hycodan. She is still taking her Tessalon pearles. She is seeing the GI doctor on 11/26 at 10:45 to see if something GI is causing her cough. RB please advise.

## 2017-01-03 NOTE — Telephone Encounter (Signed)
Noted. Needs Appointment with [facility].

## 2017-01-03 NOTE — Telephone Encounter (Signed)
Called pt to schedule appt. Offered pt Tuesday or Thursday at 3pm, pt states she has to take some meds that may her groggy in the pm. Pt states she will call back regarding scheduling appt.

## 2017-01-04 MED ORDER — HYDROCODONE-HOMATROPINE 5-1.5 MG/5ML PO SYRP
5.0000 mL | ORAL_SOLUTION | Freq: Four times a day (QID) | ORAL | 0 refills | Status: DC | PRN
Start: 2017-01-04 — End: 2017-01-18

## 2017-01-04 NOTE — Telephone Encounter (Signed)
Patient is calling checking on status.  She was advised once Dr. [name] responds, she will be called back.  She is asking if at all possible we could call her back before dark, as she does not drive after dark.  She stated she could not sleep last night and does not want to go another night without sleep.  CB is [phone]

## 2017-01-04 NOTE — Telephone Encounter (Signed)
Pt checking on status of refill-requesting asap. RB please advise.  Thanks.

## 2017-01-04 NOTE — Telephone Encounter (Signed)
Rx has been printed and signed by RB and given to pt.  Nothing further needed.

## 2017-01-11 DIAGNOSIS — M7060 Trochanteric bursitis, unspecified hip: Secondary | ICD-10-CM | POA: Diagnosis not present

## 2017-01-11 DIAGNOSIS — G894 Chronic pain syndrome: Secondary | ICD-10-CM | POA: Diagnosis not present

## 2017-01-11 DIAGNOSIS — M5136 Other intervertebral disc degeneration, lumbar region: Secondary | ICD-10-CM | POA: Diagnosis not present

## 2017-01-11 DIAGNOSIS — Z5181 Encounter for therapeutic drug level monitoring: Secondary | ICD-10-CM | POA: Diagnosis not present

## 2017-01-17 ENCOUNTER — Telehealth: Payer: Self-pay | Admitting: Emergency Medicine

## 2017-01-17 ENCOUNTER — Ambulatory Visit: Payer: PPO | Admitting: Physician Assistant

## 2017-01-17 NOTE — Telephone Encounter (Signed)
RB can we refill the hydrocodone cough medication? Please advise.  Current Outpatient Medications on File Prior to Visit  Medication Sig Dispense Refill  . ARIPiprazole (ABILIFY) 30 MG tablet Take 30 mg by mouth daily.     Marland Kitchen atorvastatin (LIPITOR) 40 MG tablet take 1 tablet by mouth at bedtime ON MONDAY, WEDNESDAY AND FRIDAY 90 tablet 2  . benzonatate (TESSALON) 200 MG capsule Take 1 capsule (200 mg total) by mouth every 6 (six) hours as needed for cough. 120 capsule 1  . Dexlansoprazole 30 MG capsule Take 1 capsule (30 mg total) by mouth daily. 90 capsule 2  . diclofenac sodium (VOLTAREN) 1 % GEL Apply 2 g topically 4 (four) times daily.     Marland Kitchen FLUoxetine (PROZAC) 20 MG capsule     . fluticasone (FLONASE) 50 MCG/ACT nasal spray instill 2 sprays into each nostril once daily 16 g 5  . HYDROcodone-acetaminophen (NORCO/VICODIN) 5-325 MG tablet Take 1 tablet by mouth every 6 (six) hours as needed for moderate pain. x3 daily PRN    . HYDROcodone-homatropine (HYCODAN) 5-1.5 MG/5ML syrup Take 5 mLs every 6 (six) hours as needed by mouth for cough. 240 mL 0  . hydrOXYzine (VISTARIL) 25 MG capsule Take 25 mg by mouth 2 (two) times daily.  0  . lamoTRIgine (LAMICTAL) 100 MG tablet Take 100 mg by mouth daily. Patient taking x2 daily    . losartan (COZAAR) 50 MG tablet Take 1 tablet (50 mg total) by mouth daily. 90 tablet 2  . methocarbamol (ROBAXIN) 500 MG tablet Take 500 mg by mouth 2 (two) times daily.    . Multiple Vitamin (MULTIVITAMIN WITH MINERALS) TABS tablet Take 1 tablet by mouth daily. 30 tablet 0  . predniSONE (DELTASONE) 10 MG tablet 3 pills daily for 2 days, 2 pills daily for 2 days, 1 pill daily for 2 days 12 tablet 0  . promethazine (PHENERGAN) 12.5 MG suppository Place 1 suppository (12.5 mg total) every 6 (six) hours as needed rectally for nausea or vomiting. 12 each 0  . varenicline (CHANTIX PAK) 0.5 MG X 11 & 1 MG X 42 tablet Take one 0.5 mg tablet by mouth once daily for 3 days, then  increase to one 0.5 mg tablet twice daily for 4 days, then increase to one 1 mg tablet twice daily. 53 tablet 0   No current facility-administered medications on file prior to visit.    No Known Allergies

## 2017-01-18 MED ORDER — HYDROCODONE-HOMATROPINE 5-1.5 MG/5ML PO SYRP: 5.0000 mL | ORAL_SOLUTION | Freq: Four times a day (QID) | ORAL | 0 refills | Status: DC | PRN

## 2017-01-18 NOTE — Telephone Encounter (Signed)
Rx printed

## 2017-01-18 NOTE — Telephone Encounter (Signed)
Called pt and advised message from the provider. Pt understood and verbalized understanding. Nothing further is needed.    Rx up front ready to pick it up.

## 2017-01-18 NOTE — Telephone Encounter (Signed)
Please let her know that we can refill x 1, do not want this to be a chronic medication for her. Will ned to continue to discuss alternative ways to help with her cough when we follow up.

## 2017-01-25 ENCOUNTER — Ambulatory Visit: Payer: PPO | Admitting: Emergency Medicine

## 2017-01-25 ENCOUNTER — Encounter: Payer: Self-pay | Admitting: Emergency Medicine

## 2017-01-25 VITALS — BP 134/84 | HR 118 | Ht 65.0 in | Wt 203.0 lb

## 2017-01-25 DIAGNOSIS — R059 Cough, unspecified: Secondary | ICD-10-CM

## 2017-01-25 DIAGNOSIS — R05 Cough: Secondary | ICD-10-CM

## 2017-01-25 MED ORDER — HYDROCODONE-HOMATROPINE 5-1.5 MG/5ML PO SYRP: 5.0000 mL | ORAL_SOLUTION | Freq: Four times a day (QID) | ORAL | 0 refills | Status: DC | PRN

## 2017-01-25 NOTE — Progress Notes (Signed)
Subjective:    Patient ID: [name] [name], female    DOB: [birth year], 56 y.o.   MRN: [id number]  Cough  Associated symptoms include rhinorrhea and shortness of breath. Pertinent negatives include no ear pain, eye redness, fever, headaches, postnasal drip, rash, sore throat or wheezing.   56 year old smoker (20+ pack years), history of obesity, GERD. She has been followed in the past in our office for chronic cough. This was felt to be mostly upper airway in nature, was ascribed to GERD / chronic LPR. She was treated with cough suppression, narcotic cough syrup. She was on Reglan, not currently. She is currently on Dexilant. Then beginning about 1 yr ago she started to have more and more cough, has evolved paroxysms that are associated with SOB. She wonders if her reflux id well controlled, also notes that her Norco was decreased about 1 year ago. She wonders if that is part of the reason. She has not benefited from tessalon in the past. She is on fluticasone NS qd. Cough usually non-productive, occasionally thick white. She is occasionally SOB with exertion, household chores.   She underwent a sleep study 03/09/14 that show evidence for obstructive sleep apnea. I don't see any in PFT testing in the chart.  ROV 11/29/16 -- Follow-up visit for patient with a history of tobacco use, obesity, GERD. I saw her for chronic cough last month. Felt to be largely upper airway in nature with contributions from GERD and allergic rhinitis. We performed chest x-ray 9/17 that showed suspected right upper lobe nodule, confirmed on CT scan of the chest 9/19. I have reviewed there is a 2.1 cm lobular nodule in the peripheral right upper lobe. Also noted are smaller bilateral pulmonary nodules 2-4 mm in size. Subsequent PET scan on 9/27 also reviewed. This shows low level uptake (SUV 2.5) of the 2.1 x 1.4 cm nodule as well as intense uptake in the anus without a clear CT scan correlate. She has CSY arranged for 10/18 to eval  hypermetabolism in the ano/rectal area. She had PFT that I have reviewed > normal.   She continues to have some cough. She was started on prednisone over the weekend for persistent cough. She is using benadryl qd, hasn't started loratadine yet. She is using tussionex qhs                ROV 12/23/16 --this is a follow-up visit for patient with a history of tobacco use, obesity, GERD and chronic cough that appears to be upper airway in nature.  Her spirometry is normal.  Currently smoking approximately 2-3 a day.  she also has a 2.1 cm lobular right upper lobe nodule that is only minimally hypermetabolic on PET scan.  She underwent colonoscopy because there was an area of hypermetabolism in the perianal region on the PET scan.  No associated anatomical correlate was found but she did have some precancerous polyps that were removed.  She has continued to have severe cough often waking her at night.  She has experienced proxisms and posttussive emesis. She is on loratadine, dexlansoprazole, flonase.                        Acute OV 01/25/17 --56 year old active smoker, without documented obstructive lung disease by spirometry, followed for chronic cough and for a 2.1 cm lobular right upper lobe nodule, minimally hypermetabolic by PET scan 5/53/74.  She has significant GERD, nausea and chronic emesis and LPR that likely contribute  to her chronic cough, on Dexilant.  Also allergic rhinitis which is been treated with loratadine and fluticasone nasal spray.         She presents today feeling ill - coughing, having emesis. Tells me that she had been well until early am this am. She ran out of hycodan. This am she had refractory cough, resulting post tussive emesis. No fevers, chills, aches, sick contacts. She has sore throat, dry cough. Voice fairly strong today.                                                                                                                                                                                                                                                                                                                                                   Review of Systems  Constitutional: Negative for fever and unexpected weight change.  HENT: Positive for rhinorrhea. Negative for congestion, dental problem, ear pain, nosebleeds, postnasal drip, sinus pressure, sneezing, sore throat and trouble swallowing.   Eyes: Negative for redness and itching.  Respiratory: Positive for cough and shortness of breath. Negative for chest tightness and wheezing.   Cardiovascular: Negative for palpitations and leg swelling.  Gastrointestinal: Positive for nausea and vomiting.  Genitourinary: Negative for dysuria.  Musculoskeletal: Negative for joint swelling.  Skin: Negative for rash.  Neurological: Negative for headaches.  Hematological: Does not bruise/bleed easily.  Psychiatric/Behavioral: Negative for dysphoric mood. The patient is not nervous/anxious.         Objective:   Physical Exam  Vitals:   01/25/17 0916  BP: 134/84  Pulse: (!) 118  SpO2: 99%  Weight: 203 lb (92.1 kg)  Height: 5\' 5"  (1.651 m)   Gen: Ill appearing, uncomfortable, overwt woman, in no distress,  normal affect  ENT: No lesions,  mouth clear,  oropharynx clear, no postnasal drip  Neck:  No JVD, hoarse voice, no stridor  Lungs: No use of accessory muscles, clear without rales or rhonchi  Cardiovascular: RRR, heart sounds normal, no murmur or gallops, no peripheral edema  Musculoskeletal: No deformities, no cyanosis or clubbing  Neuro: alert, non focal  Skin: Warm, no lesions or rashes     Assessment & Plan:  TOBACCO ABUSE Continues to smoke.  Discussed the importance of cessation with her.  She notes the difficulties of stopping in the setting of her bipolar disorder, mood swings, etc.  Cough Contributions of gastroesophageal reflux and chronic rhinitis.  Suspect some breakthrough of both  even on medical therapy.  Also there is evidence that there is a functional component here, at times here she appears to be forcing herself to cough and then there is associated posttussive emesis.  She relates her decline to inability to take her Hycodan.  I will refill it once now.  Explained to her that I will not continue to give it to her in the future.  Of also arrange for an allergy referral.  She already follows with GI.  We will refer you to Allergy to consider skin testing, see if you are a candidate for allergy shots.  Please continue your loratadine, nasal washes, fluticasone nasal spray as you are taking them Please continue your Dexilant as you are taking it.  Follow up at [facility] as planned.  We will refill you Hycodan now, but will not be able to continue this medication long term. We need to concentrate instead on addressing the underlying contributors to your cough.  You need to stop smoking. This is contributing to chronic cough.  Follow with Dr [name] in 2 months or sooner if you have any problems.   Baltazar Apo, MD, PhD 01/25/2017, 9:43 AM [facility] [phone] or if no answer [phone]

## 2017-01-25 NOTE — Assessment & Plan Note (Addendum)
Contributions of gastroesophageal reflux and chronic rhinitis.  Suspect some breakthrough of both even on medical therapy.  Also there is evidence that there is a functional component here, at times here she appears to be forcing herself to cough and then there is associated posttussive emesis.  She relates her decline to inability to take her Hycodan.  I will refill it once now.  Explained to her that I will not continue to give it to her in the future.  Of also arrange for an allergy referral.  She already follows with GI.  We will refer you to Allergy to consider skin testing, see if you are a candidate for allergy shots.  Please continue your loratadine, nasal washes, fluticasone nasal spray as you are taking them Please continue your Dexilant as you are taking it.  Follow up at [facility] as planned.  We will refill you Hycodan now, but will not be able to continue this medication long term. We need to concentrate instead on addressing the underlying contributors to your cough.  You need to stop smoking. This is contributing to chronic cough.  Follow with Dr [name] in 2 months or sooner if you have any problems.

## 2017-01-25 NOTE — Patient Instructions (Addendum)
We will refer you to Allergy to consider skin testing, see if you are a candidate for allergy shots.  Please continue your loratadine, nasal washes, fluticasone nasal spray as you are taking them Please continue your Dexilant as you are taking it.  Follow up at [facility] as planned.  We will refill you Hycodan now, but will not be able to continue this medication long term. We need to concentrate instead on addressing the underlying contributors to your cough.  You need to stop smoking. This is contributing to chronic cough.  Follow with Dr [name] in 2 months or sooner if you have any problems.

## 2017-01-25 NOTE — Assessment & Plan Note (Signed)
Continues to smoke.  Discussed the importance of cessation with her.  She notes the difficulties of stopping in the setting of her bipolar disorder, mood swings, etc.

## 2017-01-27 DIAGNOSIS — M5136 Other intervertebral disc degeneration, lumbar region: Secondary | ICD-10-CM | POA: Diagnosis not present

## 2017-01-27 DIAGNOSIS — M7060 Trochanteric bursitis, unspecified hip: Secondary | ICD-10-CM | POA: Diagnosis not present

## 2017-01-27 DIAGNOSIS — Z5181 Encounter for therapeutic drug level monitoring: Secondary | ICD-10-CM | POA: Diagnosis not present

## 2017-01-27 DIAGNOSIS — G894 Chronic pain syndrome: Secondary | ICD-10-CM | POA: Diagnosis not present

## 2017-02-07 ENCOUNTER — Telehealth: Payer: Self-pay | Admitting: Emergency Medicine

## 2017-02-07 NOTE — Telephone Encounter (Signed)
Called and spoke with pt. Pt is requesting refill on Hycodan to get her thru the holidays.  Hycodan last refilled 01/25/17 #2104ml  RB please advise. Thanks.

## 2017-02-09 NOTE — Telephone Encounter (Signed)
Spoke with pt. She is aware that we can't refill her prescription. Nothing further was needed.

## 2017-02-09 NOTE — Telephone Encounter (Signed)
Sorry but we can't refill it. I do not want this to be a chronic med

## 2017-02-09 NOTE — Telephone Encounter (Signed)
RB please advise. Thanks.  

## 2017-02-17 ENCOUNTER — Ambulatory Visit (INDEPENDENT_AMBULATORY_CARE_PROVIDER_SITE_OTHER)
Admission: RE | Admit: 2017-02-17 | Discharge: 2017-02-17 | Disposition: A | Discharge status: Home / Self Care | Payer: PPO | Source: Ambulatory Visit | Attending: Emergency Medicine | Admitting: Emergency Medicine

## 2017-02-17 DIAGNOSIS — R918 Other nonspecific abnormal finding of lung field: Secondary | ICD-10-CM | POA: Diagnosis not present

## 2017-02-17 DIAGNOSIS — R911 Solitary pulmonary nodule: Secondary | ICD-10-CM | POA: Diagnosis not present

## 2017-02-18 ENCOUNTER — Telehealth: Payer: Self-pay | Admitting: Emergency Medicine

## 2017-02-18 NOTE — Telephone Encounter (Signed)
LMTCB RB please advise of results

## 2017-02-21 NOTE — Telephone Encounter (Signed)
Please let the patient know that her pulmonary nodule has not changed in shape, size or appearance.  This is good news.  We can review the scan in detail at her next office visit.  We will continue to follow the nodule to screen for any interval change that would prompt [place] to seek out a tissue diagnosis.

## 2017-02-23 NOTE — Telephone Encounter (Signed)
Patient returning call for results, [phone].

## 2017-02-23 NOTE — Telephone Encounter (Signed)
Spoke with pt. She is aware of results. Nothing further was needed.  

## 2017-02-28 ENCOUNTER — Telehealth: Payer: Self-pay | Admitting: Emergency Medicine

## 2017-02-28 ENCOUNTER — Encounter: Payer: Self-pay | Admitting: Emergency Medicine

## 2017-02-28 ENCOUNTER — Ambulatory Visit (INDEPENDENT_AMBULATORY_CARE_PROVIDER_SITE_OTHER): Payer: PPO | Admitting: Emergency Medicine

## 2017-02-28 DIAGNOSIS — F17200 Nicotine dependence, unspecified, uncomplicated: Secondary | ICD-10-CM | POA: Diagnosis not present

## 2017-02-28 DIAGNOSIS — R911 Solitary pulmonary nodule: Secondary | ICD-10-CM | POA: Diagnosis not present

## 2017-02-28 DIAGNOSIS — R059 Cough, unspecified: Secondary | ICD-10-CM

## 2017-02-28 DIAGNOSIS — R05 Cough: Secondary | ICD-10-CM

## 2017-02-28 MED ORDER — HYDROCODONE-HOMATROPINE 5-1.5 MG/5ML PO SYRP: 5.0000 mL | ORAL_SOLUTION | Freq: Four times a day (QID) | ORAL | 0 refills | Status: DC | PRN

## 2017-02-28 MED ORDER — ALBUTEROL SULFATE HFA 108 (90 BASE) MCG/ACT IN AERS: 2.0000 | INHALATION_SPRAY | RESPIRATORY_TRACT | 5 refills | Status: DC | PRN

## 2017-02-28 NOTE — Assessment & Plan Note (Signed)
Being driven by allergic rhinitis, GERD, tobacco use.  Probably also some obstructive lung disease given her history of methacholine responsiveness.  Continue therapy for exacerbations.  Consider reinitiation of bronchodilators as we go forward.  We will refill Hycodan for you to use as needed for your cough suppression. Follow with Dr [name] in 3 months or sooner if you have any problems.

## 2017-02-28 NOTE — Telephone Encounter (Signed)
Spoke with pt. RB wanted the pt to have an albuterol inhaler. Rx has been sent in. Nothing further was needed.

## 2017-02-28 NOTE — Assessment & Plan Note (Signed)
  Your CT scan of the chest done 12/27 was stable.  We will repeat in 6 months.

## 2017-02-28 NOTE — Progress Notes (Signed)
Subjective:    Patient ID: [name] [name], female    DOB: March 12, 1960, 57 y.o.   MRN: [id number]  Cough  Associated symptoms include rhinorrhea and shortness of breath. Pertinent negatives include no ear pain, eye redness, fever, headaches, postnasal drip, rash, sore throat or wheezing.   57 year old smoker (20+ pack years), history of obesity, GERD. She has been followed in the past in our office for chronic cough. This was felt to be mostly upper airway in nature, was ascribed to GERD / chronic LPR. She was treated with cough suppression, narcotic cough syrup. She was on Reglan, not currently. She is currently on Dexilant. Then beginning about 1 yr ago she started to have more and more cough, has evolved paroxysms that are associated with SOB. She wonders if her reflux id well controlled, also notes that her Norco was decreased about 1 year ago. She wonders if that is part of the reason. She has not benefited from tessalon in the past. She is on fluticasone NS qd. Cough usually non-productive, occasionally thick white. She is occasionally SOB with exertion, household chores.   She underwent a sleep study 03/09/14 that show evidence for obstructive sleep apnea. I don't see any in PFT testing in the chart.  ROV 11/29/16 -- Follow-up visit for patient with a history of tobacco use, obesity, GERD. I saw her for chronic cough last month. Felt to be largely upper airway in nature with contributions from GERD and allergic rhinitis. We performed chest x-ray 9/17 that showed suspected right upper lobe nodule, confirmed on CT scan of the chest 9/19. I have reviewed there is a 2.1 cm lobular nodule in the peripheral right upper lobe. Also noted are smaller bilateral pulmonary nodules 2-4 mm in size. Subsequent PET scan on 9/27 also reviewed. This shows low level uptake (SUV 2.5) of the 2.1 x 1.4 cm nodule as well as intense uptake in the anus without a clear CT scan correlate. She has CSY arranged for 10/18 to eval  hypermetabolism in the ano/rectal area. She had PFT that I have reviewed > normal.   She continues to have some cough. She was started on prednisone over the weekend for persistent cough. She is using benadryl qd, hasn't started loratadine yet. She is using tussionex qhs                ROV 12/23/16 --this is a follow-up visit for patient with a history of tobacco use, obesity, GERD and chronic cough that appears to be upper airway in nature.  Her spirometry is normal.  Currently smoking approximately 2-3 a day.  she also has a 2.1 cm lobular right upper lobe nodule that is only minimally hypermetabolic on PET scan.  She underwent colonoscopy because there was an area of hypermetabolism in the perianal region on the PET scan.  No associated anatomical correlate was found but she did have some precancerous polyps that were removed.  She has continued to have severe cough often waking her at night.  She has experienced proxisms and posttussive emesis. She is on loratadine, dexlansoprazole, flonase.                        Acute OV 01/25/17 --57 year old active smoker, without documented obstructive lung disease by spirometry, followed for chronic cough and for a 2.1 cm lobular right upper lobe nodule, minimally hypermetabolic by PET scan 08/07/05.  She has significant GERD, nausea and chronic emesis and LPR that likely contribute  to her chronic cough, on Dexilant.  Also allergic rhinitis which is been treated with loratadine and fluticasone nasal spray.         She presents today feeling ill - coughing, having emesis. Tells me that she had been well until early am this am. She ran out of hycodan. This am she had refractory cough, resulting post tussive emesis. No fevers, chills, aches, sick contacts. She has sore throat, dry cough. Voice fairly strong today.          ROV 02/28/17 --this is a follow-up visit.  Patient is 5, continues to smoke approximately 3-4 cig a day, without any documented obstruction by  spirometry.  We have followed her for a history of severe chronic cough, and a right upper lobe lobulated pulmonary nodule.  We repeated her CT scan of the chest 02/17/17 and I have reviewed.  This shows that the nodule is stable in size without any significant change in shape.  She returns today with continued cough. She is on flonase, Dexilant, loratadine. She is out of hycodan. She is to see Allergy in February.                                                                                                                                                                                                                                                                                                   Review of Systems  Constitutional: Negative for fever and unexpected weight change.  HENT: Positive for rhinorrhea. Negative for congestion, dental problem, ear pain, nosebleeds, postnasal drip, sinus pressure, sneezing, sore throat and trouble swallowing.   Eyes: Negative for redness and itching.  Respiratory: Positive for cough and shortness of breath. Negative for chest tightness and wheezing.   Cardiovascular: Negative for palpitations and leg swelling.  Gastrointestinal: Positive for nausea and vomiting.  Genitourinary: Negative for dysuria.  Musculoskeletal: Negative for joint swelling.  Skin: Negative for rash.  Neurological: Negative for headaches.  Hematological: Does not bruise/bleed easily.  Psychiatric/Behavioral: Negative for dysphoric mood. The patient is not nervous/anxious.  Objective:   Physical Exam  Vitals:   02/28/17 0953  BP: 132/62  Pulse: 85  SpO2: 100%  Weight: 203 lb 9.6 oz (92.4 kg)  Height: 5\' 5"  (1.651 m)   Gen: Ill appearing, uncomfortable, overwt woman, in no distress,  normal affect  ENT: No lesions,  mouth clear,  oropharynx clear, no postnasal drip  Neck: No JVD, hoarse voice, no stridor  Lungs: No use of accessory muscles, clear without rales or  rhonchi  Cardiovascular: RRR, heart sounds normal, no murmur or gallops, no peripheral edema  Musculoskeletal: No deformities, no cyanosis or clubbing  Neuro: alert, non focal  Skin: Warm, no lesions or rashes     Assessment & Plan:  Cough Being driven by allergic rhinitis, GERD, tobacco use.  Probably also some obstructive lung disease given her history of methacholine responsiveness.  Continue therapy for exacerbations.  Consider reinitiation of bronchodilators as we go forward.  We will refill Hycodan for you to use as needed for your cough suppression. Follow with Dr [name] in 3 months or sooner if you have any problems.  Pulmonary nodule, right  Your CT scan of the chest done 12/27 was stable.  We will repeat in 6 months.  TOBACCO ABUSE She has cut down.  Knows that she needs to stop altogether.  We discussed this today.  She is using Chantix.  Baltazar Apo, MD, PhD 02/28/2017, 12:42 PM [facility] [phone] or if no answer [phone]

## 2017-02-28 NOTE — Patient Instructions (Signed)
Please continue to use Flonase, Dexilant, loratadine as you have been taking. Keep your appointment with allergy as planned Your CT scan of the chest done 12/27 was stable.  We will repeat in 6 months. We will refill Hycodan for you to use as needed for your cough suppression. Follow with Dr [name] in 3 months or sooner if you have any problems.

## 2017-02-28 NOTE — Assessment & Plan Note (Signed)
She has cut down.  Knows that she needs to stop altogether.  We discussed this today.  She is using Chantix.

## 2017-03-01 ENCOUNTER — Telehealth: Payer: Self-pay | Admitting: Emergency Medicine

## 2017-03-01 MED ORDER — ALBUTEROL SULFATE HFA 108 (90 BASE) MCG/ACT IN AERS: 2.0000 | INHALATION_SPRAY | RESPIRATORY_TRACT | 5 refills | Status: DC | PRN

## 2017-03-01 NOTE — Telephone Encounter (Signed)
Spoke with pt. States that her insurance will not cover Ventolin but will cover Proair. New rx for Proair has been sent in. Nothing further was needed.

## 2017-03-04 ENCOUNTER — Other Ambulatory Visit: Payer: Self-pay

## 2017-03-04 ENCOUNTER — Telehealth: Payer: Self-pay | Admitting: Internal Medicine

## 2017-03-04 MED ORDER — PROMETHAZINE HCL 25 MG RE SUPP: 25.0000 mg | Freq: Four times a day (QID) | RECTAL | 0 refills | Status: DC | PRN

## 2017-03-04 NOTE — Telephone Encounter (Signed)
Patient advised.

## 2017-03-04 NOTE — Telephone Encounter (Signed)
States afebrile, nausea and vomiting of yellow bile. She has been using Emetrol but continues to have vomiting. No abdominal pain. Please advise.

## 2017-03-04 NOTE — Telephone Encounter (Signed)
Phenergan suppositories. One quantity. No refills. One every 6 hours as needed. Future request for Phenergan should go through her PCP as she has been instructed before. Happy to refill at this time once since it is Friday late afternoon

## 2017-03-07 ENCOUNTER — Encounter: Payer: Self-pay | Admitting: Internal Medicine

## 2017-03-07 ENCOUNTER — Telehealth: Payer: Self-pay | Admitting: Emergency Medicine

## 2017-03-07 ENCOUNTER — Ambulatory Visit: Payer: PPO | Admitting: Internal Medicine

## 2017-03-07 VITALS — BP 108/74 | HR 97 | Ht 65.0 in | Wt 202.0 lb

## 2017-03-07 DIAGNOSIS — K219 Gastro-esophageal reflux disease without esophagitis: Secondary | ICD-10-CM

## 2017-03-07 DIAGNOSIS — F458 Other somatoform disorders: Secondary | ICD-10-CM

## 2017-03-07 DIAGNOSIS — R112 Nausea with vomiting, unspecified: Secondary | ICD-10-CM

## 2017-03-07 DIAGNOSIS — R198 Other specified symptoms and signs involving the digestive system and abdomen: Secondary | ICD-10-CM

## 2017-03-07 DIAGNOSIS — R0989 Other specified symptoms and signs involving the circulatory and respiratory systems: Secondary | ICD-10-CM

## 2017-03-07 MED ORDER — PROMETHAZINE HCL 25 MG RE SUPP: 25.0000 mg | Freq: Four times a day (QID) | RECTAL | 0 refills | Status: DC | PRN

## 2017-03-07 MED ORDER — DEXLANSOPRAZOLE 60 MG PO CPDR: 60.0000 mg | DELAYED_RELEASE_CAPSULE | Freq: Every day | ORAL | 6 refills | Status: DC

## 2017-03-07 MED ORDER — HYDROCODONE-HOMATROPINE 5-1.5 MG/5ML PO SYRP
5.0000 mL | ORAL_SOLUTION | Freq: Four times a day (QID) | ORAL | 0 refills | Status: DC | PRN
Start: 2017-03-07 — End: 2017-03-31

## 2017-03-07 MED ORDER — PREDNISONE 20 MG PO TABS: 20.0000 mg | ORAL_TABLET | Freq: Every day | ORAL | 0 refills | Status: DC

## 2017-03-07 MED ORDER — HYDROCODONE-HOMATROPINE 5-1.5 MG/5ML PO SYRP: 5.0000 mL | ORAL_SOLUTION | Freq: Four times a day (QID) | ORAL | 0 refills | Status: DC | PRN

## 2017-03-07 NOTE — Patient Instructions (Signed)
We have sent the following medications to your pharmacy for you to pick up at your convenience:  Dexilant 60mg   You have been scheduled for an endoscopy. Please follow written instructions given to you at your visit today. If you use inhalers (even only as needed), please bring them with you on the day of your procedure. Your physician has requested that you go to www.startemmi.com and enter the access code given to you at your visit today. This web site gives a general overview about your procedure. However, you should still follow specific instructions given to you by our office regarding your preparation for the procedure.

## 2017-03-07 NOTE — Telephone Encounter (Signed)
Called and spoke to pt. Informed her of the recs per RB. Rx sent to preferred pharmacy. Pt verbalized understanding and is aware to call back for any worsening s/s. Nothing further needed. Will sign off.

## 2017-03-07 NOTE — Telephone Encounter (Signed)
Ok, if I print this RX can DOD sign? RB at the hospital.

## 2017-03-07 NOTE — Telephone Encounter (Signed)
rx printed, signed, and placed in brown accordion folder for pickup.  Pt aware.  Nothing further needed.  

## 2017-03-07 NOTE — Telephone Encounter (Signed)
Okay 

## 2017-03-07 NOTE — Telephone Encounter (Signed)
Not clear what intervention we can make except for prednisone, which carries potential side effects. If she agrees then would order prednisone 20mg  daily x 7 days, then stop

## 2017-03-07 NOTE — Telephone Encounter (Signed)
Called and spoke to pt. Pt c/o non prod cough, chest tightness, SOB only when coughing x 4 days. Pt states she has a tickle in her throat that causes her "coughing spells". Pt states she has been using the hycodan but this is only lasting 4 hours at a time. Pt denies f/c/s. Pt states she is seeing Dr. [name] with GI today at 2:15pm. Pt last seen on 1.7.2019 by Dr. [name].   Dr. [name] please advise. Thanks.

## 2017-03-07 NOTE — Telephone Encounter (Signed)
Called and spoke with pt who stated she was wanting to know if she can have a refill of the Hycodan to help with her cough.  Pt states that she really does not want to go on prednisone due to it making her very jittery and keeps her awake and this is why she is wanting a refill of the Hycodan.  Hycodan was just refilled on 02/28/17  #177mL and pt is stating she already needs a refill of it.  Dr. [name], please advise on this.  If pt cannot have a refill of the Hycodan, please advise on other recommendations for pt.  Thanks!

## 2017-03-07 NOTE — Telephone Encounter (Signed)
She is seeing GI, is on dexilant. I am willing to fill hycodan 180cc, no RF, but will not fill it any more after that.

## 2017-03-07 NOTE — Telephone Encounter (Signed)
It sounds like she is taking hycodan around the clock if she used 180cc in 1 week. My goal is to eliminate the contributors to her cough so that she will not need so much hycodan.

## 2017-03-09 ENCOUNTER — Other Ambulatory Visit: Payer: Self-pay

## 2017-03-09 ENCOUNTER — Encounter: Payer: Self-pay | Admitting: Internal Medicine

## 2017-03-09 MED ORDER — PROMETHAZINE HCL 25 MG RE SUPP: 25.0000 mg | Freq: Four times a day (QID) | RECTAL | 0 refills | Status: DC | PRN

## 2017-03-09 NOTE — Progress Notes (Signed)
HISTORY OF PRESENT ILLNESS:  [name] [name] is a 57 y.o. female with multiple medical problems including bipolar disorder, history of alcohol-induced pancreatitis (2014), morbid obesity, GERD, and chronic pain syndrome on chronic narcotics. Previous patient of Dr. Verl Blalock. I last saw her in March 2017 for chronic nausea without alarm features. More recently evaluated by GI physician assistant in October 2018 with the need to clarify abnormal PET scan and rectal bleeding. For these issues she underwent complete colonoscopy 12/09/2016. She was found to have multiple diminutive polyps, pandiverticulosis, and internal/external hemorrhoids. Follow-up in 3 years recommended. She presents today with chief complaint of nausea and vomiting. She states that problems can occur after episodic coughing spells or gagging induced by activity such as brushing her teeth. She also describes a globus type sensation. No pyrosis. She is currently under excellent 30 mg daily. She has multiple other GI complaints including belching, bloating, vague non-esophageal dysphagia, alternating bowel habits, and symptomatic hemorrhoids. She has been using Phenergan suppositories which helped her nausea. She does describe early satiety but has not had weight loss. Review of most recent laboratories and imaging unremarkable  REVIEW OF SYSTEMS:  All non-GI ROS negative unless otherwise stated in the history of present illness except for sinus and allergy, anxiety, arthritis, back pain, blood in the urine, visual change, cough, depression, fatigue, headaches, heart murmur, muscle cramps, sleeping problems, sore throat, excessive thirst, excessive urination, urinary frequency, urinary leakage, shortness of breath  Past Medical History:  Diagnosis Date  . Anal fistula   . Anemia   . Bipolar disorder ([place])   . Chronic kidney disease   . Depressive disorder, not elsewhere classified   . Diverticulosis of colon (without mention of  hemorrhage)   . Esophageal reflux   . GERD (gastroesophageal reflux disease)   . Hepatic steatosis   . Hiatal hernia   . History of recurrent UTIs   . Hyperlipidemia   . Hypertension   . Iron deficiency   . Obesity   . Pancreatitis   . Pancreatitis   . Personal history of colonic polyps 10/16/2009   hyperplastic  . Pre-diabetes   . Seizures ([place])   . Ulcerative colitis, unspecified     Past Surgical History:  Procedure Laterality Date  . FINGER AMPUTATION  2010   right index  . HEMORROIDECTOMY    . KNEE ARTHROSCOPY     bilateral, left x2  . TOTAL ABDOMINAL HYSTERECTOMY      Social History [name] [name]  reports that she has been smoking cigarettes.  She has been smoking about 0.25 packs per day. she has never used smokeless tobacco. She reports that she drinks about 0.6 oz of alcohol per week. She reports that she does not use drugs.  family history includes Atrial fibrillation in her mother; Colon polyps in her brother; Diabetes in her father; Heart attack in her father; Hyperlipidemia in her father and sister; Hypertension in her father and mother; Irritable bowel syndrome in her mother; Ovarian cancer in her maternal aunt; Thyroid disease in her brother and sister.  No Known Allergies     PHYSICAL EXAMINATION: Vital signs: BP 108/74   Pulse 97   Ht 5\' 5"  (1.651 m)   Wt 202 lb (91.6 kg)   BMI 33.61 kg/m   Constitutional: Pleasant, obese, unhealthyl-appearing, no acute distress Psychiatric: alert and oriented x3, cooperative Eyes: extraocular movements intact, anicteric, conjunctiva pink Mouth: oral pharynx moist, no lesions Neck: supple no lymphadenopathy Cardiovascular: heart regular rate and  rhythm, no murmur Lungs: clear to auscultation bilaterally Abdomen: soft, obese, nontender, nondistended, no obvious ascites, no peritoneal signs, normal bowel sounds, no organomegaly Rectal: Omitted Extremities: no clubbing, cyanosis, or lower extremity edema  bilaterally Skin: no lesions on visible extremities Neuro: No focal deficits. Cranial nerves intact  ASSESSMENT:  #1. Recurrent problems with nausea and vomiting. Possibly exacerbation of GERD. Possibly hypersensitive gag reflex. Possibly GERD. Possibly gastroparesis #2. Globus sensation #3. GERD. Possible cause for symptoms as well #4. Morbid obesity #5. Multiple adenomatous polyps on recent colonoscopy  PLAN:  #1. Reflux precautions #2. Significant weight loss #3. Increase Dexilant from 30 mg daily to 60 mg daily. Prescribed #4. Schedule upper endoscopy.The nature of the procedure, as well as the risks, benefits, and alternatives were carefully and thoroughly reviewed with the patient. Ample time for discussion and questions allowed. The patient understood, was satisfied, and agreed to proceed. #5. Consider gastric emptying scan if upper endoscopy unrevealing #6. Prescribe Phenergan suppositories 25 mg as needed #7. Surveillance colonoscopy around October 2021

## 2017-03-11 ENCOUNTER — Telehealth: Payer: Self-pay | Admitting: Internal Medicine

## 2017-03-11 ENCOUNTER — Ambulatory Visit: Payer: PPO | Admitting: Nurse Practitioner

## 2017-03-11 NOTE — Telephone Encounter (Signed)
Pt having some rectal bleeding, feels it may be from her hemorrhoids. Pt has some cort enemas on hand and wants to know if she can use those. States she is also on prednisone from her pulmonary doc. Discussed with pt that she should be able to use the enema while taking the prednisone. Pt thanked me for the call back.

## 2017-03-13 ENCOUNTER — Telehealth: Payer: Self-pay | Admitting: Internal Medicine

## 2017-03-13 NOTE — Telephone Encounter (Signed)
Having cough - gien medicines from the opffice prior week and before - this is not working. Asking what to do. I advised her ER or urgen care. She said is multi year cough for 27 years. And is muli hour wait in ER. REcommended she triage her urgency and decide. Unale to assess and RX on phone and recommend ER/UC and if she cannot or wont go, then call office 03/14/17 and make appt. She preferred the latter plan  Dr. Brand Males, M.D., [facility].C.P Pulmonary and Critical Care Medicine Staff Physician, [place] Director - Interstitial Lung Disease  Program  Pulmonary [place] [facility] [place], [place], [address]  Pager: [phone], If no answer or between  15:00h - 7:00h: call [phone] Telephone: [phone]

## 2017-03-14 ENCOUNTER — Telehealth: Payer: Self-pay | Admitting: Emergency Medicine

## 2017-03-14 NOTE — Telephone Encounter (Signed)
Left voice mail on machine for patient to return phone call back regarding MR recommendations below. X1

## 2017-03-14 NOTE — Telephone Encounter (Signed)
Left message for patient

## 2017-03-15 ENCOUNTER — Telehealth: Payer: Self-pay | Admitting: Emergency Medicine

## 2017-03-15 NOTE — Telephone Encounter (Signed)
Sorry I cannot do that.  If she feels like we can help her with Ladona Ridgel I am glad to give her that.  She can try over-the-counter Delsym which can sometimes help.

## 2017-03-15 NOTE — Telephone Encounter (Signed)
Left message for patient to call back  

## 2017-03-15 NOTE — Telephone Encounter (Signed)
(  from 03/07/17 phone note)   03/07/17 3:42 PM  Note    She is seeing GI, is on dexilant. I am willing to fill hycodan 180cc, no RF, but will not fill it any more after that.      Pt states she has been out of Hycodan X2 days, has a "horrible" nonprod cough that keeps her from sleeping at night.  As copied above, pt was given #180cc X8 days ago and was advised that no further refills would be given.   Pt states she is aware of RB's previous recs, and that she is seeing GI tomorrow although there is no note of this in pt's chart.  Pt requesting "just 60cc to get through" until she sees GI.  Pt requesting to pick this up today.   RB please advise on refill request.  Thanks.

## 2017-03-16 DIAGNOSIS — Z5181 Encounter for therapeutic drug level monitoring: Secondary | ICD-10-CM | POA: Diagnosis not present

## 2017-03-16 DIAGNOSIS — M5136 Other intervertebral disc degeneration, lumbar region: Secondary | ICD-10-CM | POA: Diagnosis not present

## 2017-03-16 DIAGNOSIS — G894 Chronic pain syndrome: Secondary | ICD-10-CM | POA: Diagnosis not present

## 2017-03-16 DIAGNOSIS — M7060 Trochanteric bursitis, unspecified hip: Secondary | ICD-10-CM | POA: Diagnosis not present

## 2017-03-16 NOTE — Telephone Encounter (Signed)
LMTCB x2  

## 2017-03-17 ENCOUNTER — Ambulatory Visit: Payer: Self-pay | Admitting: Family Medicine

## 2017-03-17 ENCOUNTER — Encounter: Payer: Self-pay | Admitting: Internal Medicine

## 2017-03-18 NOTE — Telephone Encounter (Signed)
Called pt and advised message from the provider. Pt understood and verbalized understanding. Nothing further is needed.    

## 2017-03-22 ENCOUNTER — Other Ambulatory Visit: Payer: Self-pay

## 2017-03-22 ENCOUNTER — Encounter: Payer: Self-pay | Admitting: Internal Medicine

## 2017-03-22 ENCOUNTER — Ambulatory Visit: Payer: Self-pay | Admitting: Family Medicine

## 2017-03-22 ENCOUNTER — Ambulatory Visit (AMBULATORY_SURGERY_CENTER): Payer: PPO | Admitting: Internal Medicine

## 2017-03-22 VITALS — BP 128/68 | HR 66 | Temp 98.7°F | Resp 15 | Ht 65.0 in | Wt 202.0 lb

## 2017-03-22 DIAGNOSIS — K219 Gastro-esophageal reflux disease without esophagitis: Secondary | ICD-10-CM | POA: Diagnosis not present

## 2017-03-22 DIAGNOSIS — R112 Nausea with vomiting, unspecified: Secondary | ICD-10-CM

## 2017-03-22 MED ORDER — SODIUM CHLORIDE 0.9 % IV SOLN: 500.0000 mL | Freq: Once | INTRAVENOUS | Status: DC

## 2017-03-22 NOTE — Progress Notes (Signed)
A and O x3. Report to RN. Tolerated MAC anesthesia well.Teeth unchanged after procedure.

## 2017-03-22 NOTE — Patient Instructions (Signed)
YOU HAD AN ENDOSCOPIC PROCEDURE TODAY AT [facility]:   Refer to the procedure report that was given to you for any specific questions about what was found during the examination.  If the procedure report does not answer your questions, please call your gastroenterologist to clarify.  If you requested that your care partner not be given the details of your procedure findings, then the procedure report has been included in a sealed envelope for you to review at your convenience later.  YOU SHOULD EXPECT: Some feelings of bloating in the abdomen. Passage of more gas than usual.  Walking can help get rid of the air that was put into your GI tract during the procedure and reduce the bloating. If you had a lower endoscopy (such as a colonoscopy or flexible sigmoidoscopy) you may notice spotting of blood in your stool or on the toilet paper. If you underwent a bowel prep for your procedure, you may not have a normal bowel movement for a few days.  Please Note:  You might notice some irritation and congestion in your nose or some drainage.  This is from the oxygen used during your procedure.  There is no need for concern and it should clear up in a day or so.  SYMPTOMS TO REPORT IMMEDIATELY:   Following lower endoscopy (colonoscopy or flexible sigmoidoscopy):  Excessive amounts of blood in the stool  Significant tenderness or worsening of abdominal pains  Swelling of the abdomen that is new, acute  Fever of 100F or higher   Following upper endoscopy (EGD)  Vomiting of blood or coffee ground material  New chest pain or pain under the shoulder blades  Painful or persistently difficult swallowing  New shortness of breath  Fever of 100F or higher  Black, tarry-looking stools  For urgent or emergent issues, a gastroenterologist can be reached at any hour by calling [phone].   DIET:  We do recommend a small meal at first, but then you may proceed to your regular diet.  Drink  plenty of fluids but you should avoid alcoholic beverages for 24 hours.  ACTIVITY:  You should plan to take it easy for the rest of today and you should NOT DRIVE or use heavy machinery until tomorrow (because of the sedation medicines used during the test).    FOLLOW UP: Our staff will call the number listed on your records the next business day following your procedure to check on you and address any questions or concerns that you may have regarding the information given to you following your procedure. If we do not reach you, we will leave a message.  However, if you are feeling well and you are not experiencing any problems, there is no need to return our call.  We will assume that you have returned to your regular daily activities without incident.  If any biopsies were taken you will be contacted by phone or by letter within the next 1-3 weeks.  Please call us at [phone] if you have not heard about the biopsies in 3 weeks.    SIGNATURES/CONFIDENTIALITY: You and/or your care partner have signed paperwork which will be entered into your electronic medical record.  These signatures attest to the fact that that the information above on your After Visit Summary has been reviewed and is understood.  Full responsibility of the confidentiality of this discharge information lies with you and/or your care-partner.YOU HAD AN ENDOSCOPIC PROCEDURE TODAY AT [facility]:  Refer to the procedure report that was given to you for any specific questions about what was found during the examination.  If the procedure report does not answer your questions, please call your gastroenterologist to clarify.  If you requested that your care partner not be given the details of your procedure findings, then the procedure report has been included in a sealed envelope for you to review at your convenience later.  YOU SHOULD EXPECT: Some feelings of bloating in the abdomen. Passage of more gas than  usual.  Walking can help get rid of the air that was put into your GI tract during the procedure and reduce the bloating. If you had a lower endoscopy (such as a colonoscopy or flexible sigmoidoscopy) you may notice spotting of blood in your stool or on the toilet paper. If you underwent a bowel prep for your procedure, you may not have a normal bowel movement for a few days.  Please Note:  You might notice some irritation and congestion in your nose or some drainage.  This is from the oxygen used during your procedure.  There is no need for concern and it should clear up in a day or so.  SYMPTOMS TO REPORT IMMEDIATELY:   Following upper endoscopy (EGD)  Vomiting of blood or coffee ground material  New chest pain or pain under the shoulder blades  Painful or persistently difficult swallowing  New shortness of breath  Fever of 100F or higher  Black, tarry-looking stools  For urgent or emergent issues, a gastroenterologist can be reached at any hour by calling [phone].   DIET:  We do recommend a small meal at first, but then you may proceed to your regular diet.  Drink plenty of fluids but you should avoid alcoholic beverages for 24 hours.  MEDICATIONS: Continue present medications.   ACTIVITY:  You should plan to take it easy for the rest of today and you should NOT DRIVE or use heavy machinery until tomorrow (because of the sedation medicines used during the test).    FOLLOW UP: Our staff will call the number listed on your records the next business day following your procedure to check on you and address any questions or concerns that you may have regarding the information given to you following your procedure. If we do not reach you, we will leave a message.  However, if you are feeling well and you are not experiencing any problems, there is no need to return our call.  We will assume that you have returned to your regular daily activities without incident.  If any biopsies  were taken you will be contacted by phone or by letter within the next 1-3 weeks.  Please call us at [phone] if you have not heard about the biopsies in 3 weeks.    Thank you for allowing [place] to provide for your healthcare needs today.  SIGNATURES/CONFIDENTIALITY: You and/or your care partner have signed paperwork which will be entered into your electronic medical record.  These signatures attest to the fact that that the information above on your After Visit Summary has been reviewed and is understood.  Full responsibility of the confidentiality of this discharge information lies with you and/or your care-partner.

## 2017-03-22 NOTE — Op Note (Signed)
[facility] Patient Name: [name] [name] Agent Procedure Date: 03/22/2017 9:30 AM MRN: [id number] Endoscopist: Docia Chuck. [name] , MD Age: 57 Referring MD:  Date of Birth: 1960-12-02 Gender: Female Account #: [id number] Procedure:                Upper GI endoscopy Indications:              Esophageal reflux, Nausea with vomiting (sounds                            like hyperactive gag reflex symptoms occur with                            brushing teeth and after coughing spells) Medicines:                Monitored Anesthesia Care Procedure:                Pre-Anesthesia Assessment:                           - Prior to the procedure, a History and Physical                            was performed, and patient medications and                            allergies were reviewed. The patient's tolerance of                            previous anesthesia was also reviewed. The risks                            and benefits of the procedure and the sedation                            options and risks were discussed with the patient.                            All questions were answered, and informed consent                            was obtained. Prior Anticoagulants: The patient has                            taken no previous anticoagulant or antiplatelet                            agents. ASA Grade Assessment: II - A patient with                            mild systemic disease. After reviewing the risks                            and benefits, the patient was deemed in  satisfactory condition to undergo the procedure.                           After obtaining informed consent, the endoscope was                            passed under direct vision. Throughout the                            procedure, the patient's blood pressure, pulse, and                            oxygen saturations were monitored continuously. The                            Endoscope was  introduced through the mouth, and                            advanced to the second part of duodenum. The upper                            GI endoscopy was accomplished without difficulty.                            The patient tolerated the procedure well. Scope In: Scope Out: Findings:                 The esophagus was normal.                           The stomach was normal.                           The examined duodenum was normal.                           The cardia and gastric fundus were normal on                            retroflexion. Complications:            No immediate complications. Estimated Blood Loss:     Estimated blood loss: none. Impression:               - Normal EGD                           - Hyperactive gag reflex. Recommendation:           - Patient has a contact number available for                            emergencies. The signs and symptoms of potential                            delayed complications were discussed with the  patient. Return to normal activities tomorrow.                            Written discharge instructions were provided to the                            patient.                           - Resume previous diet.                           - Continue present medications. Docia Chuck. [name], MD 03/22/2017 9:51:01 AM This report has been signed electronically.

## 2017-03-23 ENCOUNTER — Telehealth: Payer: Self-pay | Admitting: *Deleted

## 2017-03-23 NOTE — Telephone Encounter (Signed)
Left message on f/u call 

## 2017-03-23 NOTE — Telephone Encounter (Signed)
Patient returning phone call. Patient states that she is feeling fine and not having any problems.

## 2017-03-23 NOTE — Telephone Encounter (Signed)
Left message on 2nd f/u call 

## 2017-03-24 DIAGNOSIS — F319 Bipolar disorder, unspecified: Secondary | ICD-10-CM | POA: Diagnosis not present

## 2017-03-28 ENCOUNTER — Ambulatory Visit: Payer: Self-pay | Admitting: Family Medicine

## 2017-03-29 ENCOUNTER — Ambulatory Visit: Payer: Self-pay | Admitting: *Deleted

## 2017-03-29 NOTE — Telephone Encounter (Signed)
  Pt having some hypotension when checking her b/p this morning. She has been cutting back on her meds because of this.  Appointment made to have her b/p checked and meds possibly readjusted.  Home care advice given to patient with verbal understanding.  Reason for Disposition . [1] Systolic BP [id number] AND [0] taking blood pressure medications AND [3] NOT dizzy, lightheaded or weak  Answer Assessment - Initial Assessment Questions 1. BLOOD PRESSURE: "What is the blood pressure?" "Did you take at least two measurements 5 minutes apart?"     86/49 and 101/69 and now 123/77 HR 75 2. ONSET: "When did you take your blood pressure?"     This morning 3. HOW: "How did you obtain the blood pressure?" (e.g., visiting nurse, automatic home BP monitor)     Automatic home BP monitor 4. HISTORY: "Do you have a history of low blood pressure?" "What is your blood pressure normally?"    no  5. MEDICATIONS: "Are you taking any medications for blood pressure?" If yes: "Have they been changed recently?"     No does not take it everyday 6. PULSE RATE: "Do you know what your pulse rate is?"      75 7. OTHER SYMPTOMS: "Have you been sick recently?" "Have you had a recent injury?"     Just feeling kind of weak 8. PREGNANCY: "Is there any chance you are pregnant?" "When was your last menstrual period?"     No, hysterectomy in 2002  Protocols used: LOW BLOOD PRESSURE-A-AH

## 2017-03-30 ENCOUNTER — Ambulatory Visit: Payer: Self-pay | Admitting: Physician Assistant

## 2017-03-30 ENCOUNTER — Ambulatory Visit: Payer: Self-pay | Admitting: Allergy and Immunology

## 2017-03-31 ENCOUNTER — Ambulatory Visit: Payer: Self-pay | Admitting: Family Medicine

## 2017-03-31 ENCOUNTER — Other Ambulatory Visit: Payer: Self-pay

## 2017-03-31 ENCOUNTER — Encounter: Payer: Self-pay | Admitting: Family Medicine

## 2017-03-31 ENCOUNTER — Ambulatory Visit (INDEPENDENT_AMBULATORY_CARE_PROVIDER_SITE_OTHER): Payer: PPO | Admitting: Family Medicine

## 2017-03-31 VITALS — BP 132/84 | HR 94 | Temp 98.0°F | Ht 65.75 in | Wt 203.4 lb

## 2017-03-31 DIAGNOSIS — I959 Hypotension, unspecified: Secondary | ICD-10-CM | POA: Diagnosis not present

## 2017-03-31 NOTE — Progress Notes (Signed)
2/7/201911:25 AM  [name] Jun 27, 1960, 57 y.o. female [id number]  Chief Complaint  Patient presents with  . Follow-up    taking bp meds Losartan, says when she takes it, her bp is dropping to low. Has been taking it 2-3 yrs    HPI:   Patient is a 57 y.o. female with past medical history significant for HTN who presents today for concerns of low blood pressure.  Had noticed that every time she took her losartan she would feel tired, as if she would pass out Started checking her bp at home, consecutively readings 90/50, therefore decided to stop losartan Stopped taking losartan 3 days ago, now home bp readings around 110-120/70s  Last BMP 06/2016, normal crt and a1c 6.7 No urine micro/crt ratio available   Depression screen [facility] 2/9 03/31/2017 12/27/2016 06/24/2016  Decreased Interest 0 1 0  Down, Depressed, Hopeless 0 1 0  PHQ - 2 Score 0 2 0  Altered sleeping - 1 -  Tired, decreased energy - 1 -  Change in appetite - 1 -  Feeling bad or failure about yourself  - 1 -  Trouble concentrating - 1 -  Moving slowly or fidgety/restless - 0 -  Suicidal thoughts - 0 -  PHQ-9 Score - 7 -    No Known Allergies  Prior to Admission medications   Medication Sig Start Date End Date Taking? Authorizing Provider  albuterol (PROAIR HFA) 108 (90 Base) MCG/ACT inhaler Inhale 2 puffs into the lungs every 4 (four) hours as needed for wheezing or shortness of breath. 03/01/17  Yes Collene Gobble, MD  ARIPiprazole (ABILIFY) 30 MG tablet Take 30 mg by mouth daily.  09/30/15  Yes [provider]  atorvastatin (LIPITOR) 40 MG tablet take 1 tablet by mouth at bedtime ON MONDAY, [facility] AND FRIDAY 09/09/16  Yes Nche, Charlene Brooke, NP  benzonatate (TESSALON) 200 MG capsule Take 1 capsule (200 mg total) by mouth every 6 (six) hours as needed for cough. 12/23/16  Yes Collene Gobble, MD  dexlansoprazole (DEXILANT) 60 MG capsule Take 1 capsule (60 mg total) by mouth daily. 03/07/17  Yes Irene Shipper, MD  diclofenac sodium (VOLTAREN) 1 % GEL Apply 2 g topically 4 (four) times daily.  10/24/15  Yes [provider]  FLUoxetine (PROZAC) 20 MG capsule  12/01/16  Yes [provider]  fluticasone (FLONASE) 50 MCG/ACT nasal spray instill 2 sprays into each nostril once daily 09/09/16  Yes Nche, Charlene Brooke, NP  HYDROcodone-acetaminophen (NORCO/VICODIN) 5-325 MG tablet Take 1 tablet by mouth every 6 (six) hours as needed for moderate pain. x3 daily PRN   Yes [provider]  lamoTRIgine (LAMICTAL) 100 MG tablet Take 100 mg by mouth daily. Patient taking x2 daily   Yes [provider]  losartan (COZAAR) 50 MG tablet Take 1 tablet (50 mg total) by mouth daily. 09/09/16  Yes Nche, Charlene Brooke, NP  methocarbamol (ROBAXIN) 500 MG tablet Take 500 mg by mouth 2 (two) times daily. 06/23/16  Yes [provider]  Multiple Vitamin (MULTIVITAMIN WITH MINERALS) TABS tablet Take 1 tablet by mouth daily. 03/15/13  Yes Velvet Bathe, MD  promethazine (PHENERGAN) 25 MG suppository Place 1 suppository (25 mg total) rectally every 6 (six) hours as needed for nausea or vomiting. 03/04/17  Yes Irene Shipper, MD  varenicline (CHANTIX PAK) 0.5 MG X 11 & 1 MG X 42 tablet Take one 0.5 mg tablet by mouth once daily for 3 days,  then increase to one 0.5 mg tablet twice daily for 4 days, then increase to one 1 mg tablet twice daily. 12/27/16  Yes Nche, Charlene Brooke, NP    Past Medical History:  Diagnosis Date  . Anal fistula   . Anemia   . Anxiety   . Arthritis   . Bipolar disorder ([place])   . Chronic kidney disease   . Depressive disorder, not elsewhere classified   . Diverticulosis of colon (without mention of hemorrhage)   . Esophageal reflux   . GERD (gastroesophageal reflux disease)   . Hepatic steatosis   . Hiatal hernia   . History of recurrent UTIs   . Hyperlipidemia   . Hypertension   . Iron deficiency   . Obesity   . Pancreatitis   . Pancreatitis   . Personal  history of colonic polyps 10/16/2009   hyperplastic  . Pre-diabetes   . Seizures ([place])   . Ulcerative colitis, unspecified     Past Surgical History:  Procedure Laterality Date  . FINGER AMPUTATION  2010   right index  . HEMORROIDECTOMY    . KNEE ARTHROSCOPY     bilateral, left x2  . TOTAL ABDOMINAL HYSTERECTOMY      Social History   Tobacco Use  . Smoking status: Current Every Day Smoker    Packs/day: 0.25    Types: Cigarettes  . Smokeless tobacco: Never Used  . Tobacco comment: pt states she smokes 3 cigarettes/ day  Substance Use Topics  . Alcohol use: Yes    Alcohol/week: 0.6 oz    Types: 1 Shots of liquor per week    Comment: occ    Family History  Problem Relation Age of Onset  . Irritable bowel syndrome Mother   . Hypertension Mother   . Atrial fibrillation Mother   . Ovarian cancer Maternal Aunt   . Diabetes Father   . Heart attack Father   . Hypertension Father   . Hyperlipidemia Father   . Thyroid disease Sister   . Hyperlipidemia Sister   . Thyroid disease Brother   . Colon polyps Brother   . Stomach cancer Neg Hx   . Colon cancer Neg Hx     Review of Systems  Constitutional: Positive for malaise/fatigue. Negative for chills and fever.  Respiratory: Negative for cough and shortness of breath.   Cardiovascular: Negative for chest pain, palpitations and leg swelling.  Gastrointestinal: Negative for abdominal pain, nausea and vomiting.  Neurological: Positive for dizziness.    OBJECTIVE:  Blood pressure 132/84, pulse 94, temperature 98 F (36.7 C), temperature source Oral, height 5' 5.75" (1.67 m), weight 203 lb 6.4 oz (92.3 kg), SpO2 94 %.  Physical Exam  Constitutional: She is oriented to person, place, and time and well-developed, well-nourished, and in no distress.  HENT:  Head: Normocephalic and atraumatic.  Mouth/Throat: Oropharynx is clear and moist. No oropharyngeal exudate.  Eyes: EOM are normal. Pupils are equal, round, and reactive  to light. No scleral icterus.  Neck: Neck supple.  Cardiovascular: Normal rate, regular rhythm and normal heart sounds. Exam reveals no gallop and no friction rub.  No murmur heard. Pulmonary/Chest: Effort normal and breath sounds normal. She has no wheezes. She has no rales.  Musculoskeletal: She exhibits no edema.  Neurological: She is alert and oriented to person, place, and time. Gait normal.  Skin: Skin is warm and dry.    ASSESSMENT and PLAN  1. Hypotension, unspecified hypotension type Discussed holding off on losartan for now  and continue to check BP at home, bring readings and bp cuff in 1 week. She has normal kidney function but unknown microalbuminuria. BP today within goal for DM.   Return in about 1 week (around 04/07/2017).    Rutherford Guys, MD Primary Care at [facility] [place], [place] [address] Ph.  [phone] Fax [phone]

## 2017-03-31 NOTE — Patient Instructions (Addendum)
  1. Do not restart losartan. Check your blood pressure once a day at the same time for the next week. Bring in blood pressure readings and machine to our next appt.    IF you received an x-ray today, you will receive an invoice from [facility]. Please contact [facility] at [phone] with questions or concerns regarding your invoice.   IF you received labwork today, you will receive an invoice from [facility]. Please contact [facility] at [phone] with questions or concerns regarding your invoice.   Our billing staff will not be able to assist you with questions regarding bills from these companies.  You will be contacted with the lab results as soon as they are available. The fastest way to get your results is to activate your My Chart account. Instructions are located on the last page of this paperwork. If you have not heard from [place] regarding the results in 2 weeks, please contact this office.

## 2017-04-07 ENCOUNTER — Ambulatory Visit: Payer: Self-pay | Admitting: Family Medicine

## 2017-04-12 ENCOUNTER — Telehealth: Payer: Self-pay | Admitting: Emergency Medicine

## 2017-04-12 NOTE — Telephone Encounter (Signed)
Spoke with pt, she is coughing and very frustrated about why it is not going away. She states everything with her lungs is fine and Dr. [name] increased her Dexilant but no relief. She states she has had this cough for over 20 years and she is thinking her medications may be causing her to cough. She states RB advised her that he could not continue writing her Rx for cough medication. She sees her pain management Monday and he may increase her Hydrocodone but states she cannot suffer until her appt. RB please advise.

## 2017-04-13 ENCOUNTER — Encounter: Payer: PPO | Admitting: Internal Medicine

## 2017-04-13 NOTE — Telephone Encounter (Signed)
Please let her know that I am sorry that her cough persists.  I do not see anything on her medicine list that would be absolutely causing her cough.  I had hoped that increasing her Dexilant would be beneficial.  I am not going to call in any further narcotic-based cough syrup.  Unclear to me whether she ever try the [facility] that we discussed in the past or whether these were beneficial.  This may be an option.

## 2017-04-13 NOTE — Telephone Encounter (Signed)
Pt is calling back. Cb is [phone].

## 2017-04-13 NOTE — Telephone Encounter (Signed)
Pt is aware of below message and voiced her understanding.  Pt states she has Tessalon at home and will try taking these.  Nothing further is needed.

## 2017-04-13 NOTE — Telephone Encounter (Signed)
Pt is calling for update on below message.  RB please advise. Thanks

## 2017-04-18 DIAGNOSIS — M5136 Other intervertebral disc degeneration, lumbar region: Secondary | ICD-10-CM | POA: Diagnosis not present

## 2017-04-18 DIAGNOSIS — Z5181 Encounter for therapeutic drug level monitoring: Secondary | ICD-10-CM | POA: Diagnosis not present

## 2017-04-18 DIAGNOSIS — M7060 Trochanteric bursitis, unspecified hip: Secondary | ICD-10-CM | POA: Diagnosis not present

## 2017-04-18 DIAGNOSIS — G894 Chronic pain syndrome: Secondary | ICD-10-CM | POA: Diagnosis not present

## 2017-04-21 ENCOUNTER — Ambulatory Visit: Payer: PPO | Admitting: Emergency Medicine

## 2017-04-25 ENCOUNTER — Ambulatory Visit: Payer: PPO | Admitting: Emergency Medicine

## 2017-05-03 ENCOUNTER — Ambulatory Visit: Payer: PPO | Admitting: Family Medicine

## 2017-05-04 ENCOUNTER — Ambulatory Visit: Payer: Self-pay | Admitting: Allergy and Immunology

## 2017-05-13 ENCOUNTER — Ambulatory Visit: Payer: PPO | Admitting: Adult Health

## 2017-05-16 DIAGNOSIS — Z5181 Encounter for therapeutic drug level monitoring: Secondary | ICD-10-CM | POA: Diagnosis not present

## 2017-05-16 DIAGNOSIS — G894 Chronic pain syndrome: Secondary | ICD-10-CM | POA: Diagnosis not present

## 2017-05-16 DIAGNOSIS — M7060 Trochanteric bursitis, unspecified hip: Secondary | ICD-10-CM | POA: Diagnosis not present

## 2017-05-16 DIAGNOSIS — M5136 Other intervertebral disc degeneration, lumbar region: Secondary | ICD-10-CM | POA: Diagnosis not present

## 2017-05-20 ENCOUNTER — Encounter: Payer: Self-pay | Admitting: Family

## 2017-05-20 ENCOUNTER — Other Ambulatory Visit (INDEPENDENT_AMBULATORY_CARE_PROVIDER_SITE_OTHER): Payer: PPO

## 2017-05-20 ENCOUNTER — Ambulatory Visit (INDEPENDENT_AMBULATORY_CARE_PROVIDER_SITE_OTHER): Payer: PPO | Admitting: Family

## 2017-05-20 VITALS — BP 126/74 | HR 77 | Temp 98.4°F | Ht 65.0 in | Wt 203.1 lb

## 2017-05-20 DIAGNOSIS — R635 Abnormal weight gain: Secondary | ICD-10-CM | POA: Diagnosis not present

## 2017-05-20 DIAGNOSIS — B354 Tinea corporis: Secondary | ICD-10-CM | POA: Diagnosis not present

## 2017-05-20 DIAGNOSIS — Z716 Tobacco abuse counseling: Secondary | ICD-10-CM

## 2017-05-20 DIAGNOSIS — E782 Mixed hyperlipidemia: Secondary | ICD-10-CM | POA: Diagnosis not present

## 2017-05-20 DIAGNOSIS — T148XXA Other injury of unspecified body region, initial encounter: Secondary | ICD-10-CM

## 2017-05-20 LAB — COMPREHENSIVE METABOLIC PANEL
ALT: 58 U/L — AB (ref 0–35)
AST: 169 U/L — AB (ref 0–37)
Albumin: 3.9 g/dL (ref 3.5–5.2)
Alkaline Phosphatase: 128 U/L — ABNORMAL HIGH (ref 39–117)
BUN: 7 mg/dL (ref 6–23)
CO2: 26 meq/L (ref 19–32)
CREATININE: 0.66 mg/dL (ref 0.40–1.20)
Calcium: 9.5 mg/dL (ref 8.4–10.5)
Chloride: 102 mEq/L (ref 96–112)
GFR: 98.35 mL/min (ref >60.00)
GLUCOSE: 134 mg/dL — AB (ref 70–99)
Potassium: 4.4 mEq/L (ref 3.5–5.1)
SODIUM: 139 meq/L (ref 135–145)
Total Bilirubin: 1.6 mg/dL — ABNORMAL HIGH (ref 0.2–1.2)
Total Protein: 7.4 g/dL (ref 6.0–8.3)

## 2017-05-20 LAB — TSH: TSH: 2.66 u[IU]/mL (ref 0.35–4.50)

## 2017-05-20 LAB — CBC WITH DIFFERENTIAL/PLATELET
Basophils Absolute: 0.1 10*3/uL (ref 0.0–0.1)
Basophils Relative: 1.2 % (ref 0.0–3.0)
Eosinophils Absolute: 0.1 10*3/uL (ref 0.0–0.7)
Eosinophils Relative: 2.9 % (ref 0.0–5.0)
HCT: 37.5 % (ref 36.0–46.0)
Hemoglobin: 12.8 g/dL (ref 12.0–15.0)
LYMPHS ABS: 1.5 10*3/uL (ref 0.7–4.0)
Lymphocytes Relative: 33.3 % (ref 12.0–46.0)
MCHC: 34.3 g/dL (ref 30.0–36.0)
MCV: 108.7 fl — AB (ref 78.0–100.0)
MONO ABS: 0.5 10*3/uL (ref 0.1–1.0)
MONOS PCT: 11.4 % (ref 3.0–12.0)
NEUTROS ABS: 2.3 10*3/uL (ref 1.4–7.7)
NEUTROS PCT: 51.2 % (ref 43.0–77.0)
PLATELETS: 144 10*3/uL — AB (ref 150.0–400.0)
RBC: 3.45 Mil/uL — ABNORMAL LOW (ref 3.87–5.11)
RDW: 14.9 % (ref 11.5–15.5)
WBC: 4.4 10*3/uL (ref 4.0–10.5)

## 2017-05-20 LAB — HEMOGLOBIN A1C: HEMOGLOBIN A1C: 5.3 % (ref 4.6–6.5)

## 2017-05-20 MED ORDER — KETOCONAZOLE 2 % EX CREA: 1.0000 "application " | TOPICAL_CREAM | Freq: Two times a day (BID) | CUTANEOUS | 0 refills | Status: DC

## 2017-05-20 MED ORDER — VARENICLINE TARTRATE 0.5 MG X 11 & 1 MG X 42 PO MISC: ORAL | 0 refills | Status: DC

## 2017-05-22 ENCOUNTER — Other Ambulatory Visit: Payer: Self-pay | Admitting: Family

## 2017-05-22 DIAGNOSIS — R7989 Other specified abnormal findings of blood chemistry: Secondary | ICD-10-CM

## 2017-05-22 DIAGNOSIS — R945 Abnormal results of liver function studies: Principal | ICD-10-CM

## 2017-05-22 NOTE — Progress Notes (Signed)
[name] [name] is a 57 y.o. female with the following history as recorded in Epic[employer]:  Patient Active Problem List   Diagnosis Date Noted  . Pulmonary nodule, right 11/29/2016  . Polyarthralgia 12/02/2015  . Varicose veins of both lower extremities 12/02/2015  . Hypertensive retinopathy of both eyes 11/24/2015  . Elevated liver enzymes 10/31/2015  . Hyperglycemia 10/31/2015  . SIRS (systemic inflammatory response syndrome) ([place]) 03/11/2013  . Hypertension   . Hyperlipidemia   . Anemia   . Hematochezia 08/14/2012  . ETOH abuse 08/14/2012  . Pseudocyst of pancreas 08/12/2012  . Acute pancreatitis 08/12/2012  . GERD (gastroesophageal reflux disease) 06/08/2012  . Rectal fissure 05/11/2011  . Rectal fistula 05/11/2011  . Hiatal hernia 04/28/2011  . Gastritis 04/28/2011  . Nausea 04/27/2011  . Acute epigastric pain 04/27/2011  . Obesity, Class III, BMI 40-49.9 (morbid obesity) ([place]) 04/27/2011  . Solitary rectal ulcer 04/12/2011  . Hemorrhoids 03/10/2011  . Ulcerative proctitis, nonspecific ([place]) 03/10/2011  . DIVERTICULOSIS, COLON 10/22/2009  . COLONIC POLYPS, HYPERPLASTIC, HX OF 10/22/2009  . INFECTIOUS DIARRHEA 09/27/2008  . ULCERATIVE PROCTITIS 09/27/2008  . NAUSEA AND VOMITING 09/27/2008  . NAUSEA ALONE 09/27/2008  . DIARRHEA 09/27/2008  . ABDOMINAL PAIN -GENERALIZED 09/27/2008  . DEPRESSION 09/12/2007  . HEMORRHOIDS 09/12/2007  . HIATAL HERNIA 09/12/2007  . ULCERATIVE COLITIS 09/12/2007  . RECTAL PAIN 09/12/2007  . BIPOLAR AFFECTIVE DISORDER 12/06/2006  . TOBACCO ABUSE 12/06/2006  . Esophageal reflux 12/06/2006  . Cough 12/06/2006    Current Outpatient Medications  Medication Sig Dispense Refill  . albuterol (PROAIR HFA) 108 (90 Base) MCG/ACT inhaler Inhale 2 puffs into the lungs every 4 (four) hours as needed for wheezing or shortness of breath. 1 Inhaler 5  . ARIPiprazole (ABILIFY) 30 MG tablet Take 30 mg by mouth daily.     . benzonatate (TESSALON) 200 MG  capsule Take 1 capsule (200 mg total) by mouth every 6 (six) hours as needed for cough. 120 capsule 1  . dexlansoprazole (DEXILANT) 60 MG capsule Take 1 capsule (60 mg total) by mouth daily. 30 capsule 6  . diclofenac sodium (VOLTAREN) 1 % GEL Apply 2 g topically 4 (four) times daily.     Marland Kitchen FLUoxetine (PROZAC) 20 MG capsule     . fluticasone (FLONASE) 50 MCG/ACT nasal spray instill 2 sprays into each nostril once daily 16 g 5  . HYDROcodone-acetaminophen (NORCO/VICODIN) 5-325 MG tablet Take 1 tablet by mouth every 6 (six) hours as needed for moderate pain. x3 daily PRN    . lamoTRIgine (LAMICTAL) 100 MG tablet Take 100 mg by mouth daily. Patient taking x2 daily    . methocarbamol (ROBAXIN) 500 MG tablet Take 500 mg by mouth 2 (two) times daily.    . Multiple Vitamin (MULTIVITAMIN WITH MINERALS) TABS tablet Take 1 tablet by mouth daily. 30 tablet 0  . NARCAN 4 MG/0.1ML LIQD nasal spray kit     . promethazine (PHENERGAN) 25 MG suppository Place 1 suppository (25 mg total) rectally every 6 (six) hours as needed for nausea or vomiting. 1 each 0  . ketoconazole (NIZORAL) 2 % cream Apply 1 application topically 2 (two) times daily. 15 g 0  . varenicline (CHANTIX PAK) 0.5 MG X 11 & 1 MG X 42 tablet Take one 0.5 mg tablet by mouth once daily for 3 days, then increase to one 0.5 mg tablet twice daily for 4 days, then increase to one 1 mg tablet twice daily. 53 tablet 0   Current Facility-Administered  Medications  Medication Dose Route Frequency Provider Last Rate Last Dose  . 0.9 %  sodium chloride infusion  500 mL Intravenous Once Irene Shipper, MD        Allergies: Patient has no known allergies.  Past Medical History:  Diagnosis Date  . Anal fistula   . Anemia   . Anxiety   . Arthritis   . Bipolar disorder ([place])   . Chronic kidney disease   . Depressive disorder, not elsewhere classified   . Diverticulosis of colon (without mention of hemorrhage)   . Esophageal reflux   . GERD  (gastroesophageal reflux disease)   . Hepatic steatosis   . Hiatal hernia   . History of recurrent UTIs   . Hyperlipidemia   . Hypertension   . Iron deficiency   . Obesity   . Pancreatitis   . Pancreatitis   . Personal history of colonic polyps 10/16/2009   hyperplastic  . Pre-diabetes   . Seizures ([place])   . Ulcerative colitis, unspecified     Past Surgical History:  Procedure Laterality Date  . FINGER AMPUTATION  2010   right index  . HEMORROIDECTOMY    . KNEE ARTHROSCOPY     bilateral, left x2  . TOTAL ABDOMINAL HYSTERECTOMY      Family History  Problem Relation Age of Onset  . Irritable bowel syndrome Mother   . Hypertension Mother   . Atrial fibrillation Mother   . Ovarian cancer Maternal Aunt   . Diabetes Father   . Heart attack Father   . Hypertension Father   . Hyperlipidemia Father   . Thyroid disease Sister   . Hyperlipidemia Sister   . Thyroid disease Brother   . Colon polyps Brother   . Stomach cancer Neg Hx   . Colon cancer Neg Hx     Social History   Tobacco Use  . Smoking status: Current Every Day Smoker    Packs/day: 0.25    Types: Cigarettes  . Smokeless tobacco: Never Used  . Tobacco comment: pt states she smokes 3 cigarettes/ day  Substance Use Topics  . Alcohol use: Yes    Alcohol/week: 0.6 oz    Types: 1 Shots of liquor per week    Comment: occ    Subjective:  Patient presents with numerous concerns:  1) Unexplained bruising on her extremities; admits has been doing a lot of yard work recently; works on a farm; 2) 2 "itchy spots" on her lower legs; started on left and then developed on right; has not tried any OTC medications; does have 2 cats but not currently being treated for any type of fungal infection; 3) Would like refill on Chantix; ready to quit smoking; has tolerated in the past; 4 Worried about weight gain; knows she is not eating as well as she should but would like to update labs today;    Objective:  Vitals:    05/20/17 1033  BP: 126/74  Pulse: 77  Temp: 98.4 F (36.9 C)  TempSrc: Oral  SpO2: 95%  Weight: 203 lb 1.3 oz (92.1 kg)  Height: '5\' 5"'$  (1.651 m)    General: Well developed, well nourished, in no acute distress  Skin : Warm and dry. Single circular lesion on lower left and right leg; erythematous with central clearing;  Head: Normocephalic and atraumatic  Lungs: Respirations unlabored; clear to auscultation bilaterally without wheeze, rales, rhonchi  CVS exam: normal rate and regular rhythm.  Vessels: Symmetric bilaterally  Neurologic: Alert and oriented;  speech intact; face symmetrical; moves all extremities well; CNII-XII intact without focal deficit   Assessment:  1. Bruising   2. Weight gain   3. Mixed hyperlipidemia   4. Encounter for smoking cessation counseling   5. Tinea corporis     Plan:  1. Reassurance given; suspect related to recent yard work; check CBC today; 2. Update labs; encouraged healthy eating/ exercise; 3. Check lipid panel; 4. Rx for Chantix given; congratulated patient on decision to quit; 5. Rx for Nizoral cream; apply to affected area;   No follow-ups on file.  Orders Placed This Encounter  Procedures  . CBC w/Diff    Standing Status:   Future    Number of Occurrences:   1    Standing Expiration Date:   05/20/2018  . Comp Met (CMET)    Standing Status:   Future    Number of Occurrences:   1    Standing Expiration Date:   05/20/2018  . HgB A1c    Standing Status:   Future    Number of Occurrences:   1    Standing Expiration Date:   05/20/2018  . TSH    Standing Status:   Future    Number of Occurrences:   1    Standing Expiration Date:   05/20/2018  . Lipid panel    Requested Prescriptions   Signed Prescriptions Disp Refills  . ketoconazole (NIZORAL) 2 % cream 15 g 0    Sig: Apply 1 application topically 2 (two) times daily.  . varenicline (CHANTIX PAK) 0.5 MG X 11 & 1 MG X 42 tablet 53 tablet 0    Sig: Take one 0.5 mg tablet by mouth  once daily for 3 days, then increase to one 0.5 mg tablet twice daily for 4 days, then increase to one 1 mg tablet twice daily.

## 2017-05-26 ENCOUNTER — Ambulatory Visit: Payer: PPO | Admitting: Emergency Medicine

## 2017-05-26 ENCOUNTER — Encounter: Payer: Self-pay | Admitting: Emergency Medicine

## 2017-05-26 ENCOUNTER — Telehealth: Payer: Self-pay | Admitting: Emergency Medicine

## 2017-05-26 DIAGNOSIS — F17200 Nicotine dependence, unspecified, uncomplicated: Secondary | ICD-10-CM | POA: Diagnosis not present

## 2017-05-26 DIAGNOSIS — R042 Hemoptysis: Secondary | ICD-10-CM | POA: Diagnosis not present

## 2017-05-26 DIAGNOSIS — R911 Solitary pulmonary nodule: Secondary | ICD-10-CM | POA: Diagnosis not present

## 2017-05-26 HISTORY — DX: Hemoptysis: R04.2

## 2017-05-26 NOTE — Assessment & Plan Note (Signed)
She needs a repeat CT scan of the chest in June 2019

## 2017-05-26 NOTE — Telephone Encounter (Signed)
Spoke with pt, I advised her it is hard to change times around since we borrow their rooms to perform the procedure. She states her medications she takes at night can cause her to be too groggy and it is hard to get up that early. Pt stated she will make it there at 7:30 because it would be too much to switch times and then they may not be available. Nothing further is needed.

## 2017-05-26 NOTE — Progress Notes (Signed)
Subjective:    Patient ID: [name] [name], female    DOB: 1961-01-14, 57 y.o.   MRN: [id number]  Cough  Associated symptoms include rhinorrhea and shortness of breath. Pertinent negatives include no ear pain, eye redness, fever, headaches, postnasal drip, rash, sore throat or wheezing.   57 year old smoker (20+ pack years), history of obesity, GERD. She has been followed in the past in our office for chronic cough. This was felt to be mostly upper airway in nature, was ascribed to GERD / chronic LPR. She was treated with cough suppression, narcotic cough syrup. She was on Reglan, not currently. She is currently on Dexilant. Then beginning about 1 yr ago she started to have more and more cough, has evolved paroxysms that are associated with SOB. She wonders if her reflux id well controlled, also notes that her Norco was decreased about 1 year ago. She wonders if that is part of the reason. She has not benefited from tessalon in the past. She is on fluticasone NS qd. Cough usually non-productive, occasionally thick white. She is occasionally SOB with exertion, household chores.   She underwent a sleep study 03/09/14 that show evidence for obstructive sleep apnea. I don't see any in PFT testing in the chart.              ROV 12/23/16 --this is a follow-up visit for patient with a history of tobacco use, obesity, GERD and chronic cough that appears to be upper airway in nature.  Her spirometry is normal.  Currently smoking approximately 2-3 a day.  she also has a 2.1 cm lobular right upper lobe nodule that is only minimally hypermetabolic on PET scan.  She underwent colonoscopy because there was an area of hypermetabolism in the perianal region on the PET scan.  No associated anatomical correlate was found but she did have some precancerous polyps that were removed.  She has continued to have severe cough often waking her at night.  She has experienced proxisms and posttussive emesis. She is on loratadine,  dexlansoprazole, flonase.                        Acute OV 01/25/17 --57 year old active smoker, without documented obstructive lung disease by spirometry, followed for chronic cough and for a 2.1 cm lobular right upper lobe nodule, minimally hypermetabolic by PET scan 10/03/73.  She has significant GERD, nausea and chronic emesis and LPR that likely contribute to her chronic cough, on Dexilant.  Also allergic rhinitis which is been treated with loratadine and fluticasone nasal spray.         She presents today feeling ill - coughing, having emesis. Tells me that she had been well until early am this am. She ran out of hycodan. This am she had refractory cough, resulting post tussive emesis. No fevers, chills, aches, sick contacts. She has sore throat, dry cough. Voice fairly strong today.          ROV 02/28/17 --this is a follow-up visit.  Patient is 56, continues to smoke approximately 3-4 cig a day, without any documented obstruction by spirometry.  We have followed her for a history of severe chronic cough, and a right upper lobe lobulated pulmonary nodule.  We repeated her CT scan of the chest 02/17/17 and I have reviewed.  This shows that the nodule is stable in size without any significant change in shape.  She returns today with continued cough. She is on flonase, Dexilant, loratadine. She  is out of hycodan. She is to see Allergy in February.            ROV 05/26/17 --follow-up visit for patient with a history of tobacco use and obstructive lung disease.  She also has severe chronic cough and pulmonary nodule that we have been following with serial CT scans of the chest, stable 02/18/2016.  She is not currently on scheduled bronchodilators.  She uses albuterol approximately.  She has a hx of frequent nausea and emesis, GERD. Has been followed by Dr [name]. She is on Flonase, Dexilant, Norco.  She tells me that she had an episode about 2 weeks ago when she saw blood after an episode of coughing and then some  concomitant emesis. It was bright red, self limited. She had a reassuring EGD in January, has never had FOB. She is smoking a bit more, is about top go back on Chantix.                                                                                                                                                                                                                            Review of Systems  Constitutional: Negative for fever and unexpected weight change.  HENT: Positive for rhinorrhea. Negative for congestion, dental problem, ear pain, nosebleeds, postnasal drip, sinus pressure, sneezing, sore throat and trouble swallowing.   Eyes: Negative for redness and itching.  Respiratory: Positive for cough and shortness of breath. Negative for chest tightness and wheezing.   Cardiovascular: Negative for palpitations and leg swelling.  Gastrointestinal: Positive for nausea and vomiting.  Genitourinary: Negative for dysuria.  Musculoskeletal: Negative for joint swelling.  Skin: Negative for rash.  Neurological: Negative for headaches.  Hematological: Does not bruise/bleed easily.  Psychiatric/Behavioral: Negative for dysphoric mood. The patient is not nervous/anxious.         Objective:   Physical Exam  Vitals:   05/26/17 1114 05/26/17 1115  BP:  124/86  Pulse:  71  SpO2:  96%  Weight: 199 lb (90.3 kg)    Gen: pleasant, overwt woman, in no distress,  normal affect  ENT: No lesions,  mouth clear,  oropharynx clear, no postnasal drip  Neck: No JVD, no stridor  Lungs: No use of accessory muscles, clear without rales or rhonchi  Cardiovascular: RRR, heart sounds normal, no murmur or gallops, no peripheral edema  Musculoskeletal: No deformities, no cyanosis or clubbing  Neuro: alert, non focal  Skin: Warm, no  lesions or rashes     Assessment & Plan:  Pulmonary nodule, right She needs a repeat CT scan of the chest in June 2019  TOBACCO ABUSE Discussed cessation with her  today.  She is working on this.  She is about to restart Chantix.  Hemoptysis Unclear whether this was true hemoptysis versus hematemesis.  It was transient and I suspect that it was due to harsh coughing, harsh emesis and a blood vessel injury.  She has had a reassuring endoscopy in January.  I believe she needs bronchoscopy for airway inspection to ensure no source of bleeding.  If this is reassuring then no other action needed.  If she had continues to have bleeding then I may need to refer her back to Dr. [name] to discuss.  Baltazar Apo, MD, PhD 05/26/2017, 11:35 AM [facility] [phone] or if no answer [phone]

## 2017-05-26 NOTE — H&P (View-Only) (Signed)
Subjective:    Patient ID: [name] [name], female    DOB: [birth year], 57 y.o.   MRN: [id number]  Cough  Associated symptoms include rhinorrhea and shortness of breath. Pertinent negatives include no ear pain, eye redness, fever, headaches, postnasal drip, rash, sore throat or wheezing.   57 year old smoker (20+ pack years), history of obesity, GERD. She has been followed in the past in our office for chronic cough. This was felt to be mostly upper airway in nature, was ascribed to GERD / chronic LPR. She was treated with cough suppression, narcotic cough syrup. She was on Reglan, not currently. She is currently on Dexilant. Then beginning about 1 yr ago she started to have more and more cough, has evolved paroxysms that are associated with SOB. She wonders if her reflux id well controlled, also notes that her Norco was decreased about 1 year ago. She wonders if that is part of the reason. She has not benefited from tessalon in the past. She is on fluticasone NS qd. Cough usually non-productive, occasionally thick white. She is occasionally SOB with exertion, household chores.   She underwent a sleep study 03/09/14 that show evidence for obstructive sleep apnea. I don't see any in PFT testing in the chart.              ROV 12/23/16 --this is a follow-up visit for patient with a history of tobacco use, obesity, GERD and chronic cough that appears to be upper airway in nature.  Her spirometry is normal.  Currently smoking approximately 2-3 a day.  she also has a 2.1 cm lobular right upper lobe nodule that is only minimally hypermetabolic on PET scan.  She underwent colonoscopy because there was an area of hypermetabolism in the perianal region on the PET scan.  No associated anatomical correlate was found but she did have some precancerous polyps that were removed.  She has continued to have severe cough often waking her at night.  She has experienced proxisms and posttussive emesis. She is on loratadine,  dexlansoprazole, flonase.                        Acute OV 01/25/17 --57 year old active smoker, without documented obstructive lung disease by spirometry, followed for chronic cough and for a 2.1 cm lobular right upper lobe nodule, minimally hypermetabolic by PET scan 8/56/31.  She has significant GERD, nausea and chronic emesis and LPR that likely contribute to her chronic cough, on Dexilant.  Also allergic rhinitis which is been treated with loratadine and fluticasone nasal spray.         She presents today feeling ill - coughing, having emesis. Tells me that she had been well until early am this am. She ran out of hycodan. This am she had refractory cough, resulting post tussive emesis. No fevers, chills, aches, sick contacts. She has sore throat, dry cough. Voice fairly strong today.          ROV 02/28/17 --this is a follow-up visit.  Patient is 64, continues to smoke approximately 3-4 cig a day, without any documented obstruction by spirometry.  We have followed her for a history of severe chronic cough, and a right upper lobe lobulated pulmonary nodule.  We repeated her CT scan of the chest 02/17/17 and I have reviewed.  This shows that the nodule is stable in size without any significant change in shape.  She returns today with continued cough. She is on flonase, Dexilant, loratadine. She  is out of hycodan. She is to see Allergy in February.            ROV 05/26/17 --follow-up visit for patient with a history of tobacco use and obstructive lung disease.  She also has severe chronic cough and pulmonary nodule that we have been following with serial CT scans of the chest, stable 02/18/2016.  She is not currently on scheduled bronchodilators.  She uses albuterol approximately.  She has a hx of frequent nausea and emesis, GERD. Has been followed by Dr [name]. She is on Flonase, Dexilant, Norco.  She tells me that she had an episode about 2 weeks ago when she saw blood after an episode of coughing and then some  concomitant emesis. It was bright red, self limited. She had a reassuring EGD in January, has never had FOB. She is smoking a bit more, is about top go back on Chantix.                                                                                                                                                                                                                            Review of Systems  Constitutional: Negative for fever and unexpected weight change.  HENT: Positive for rhinorrhea. Negative for congestion, dental problem, ear pain, nosebleeds, postnasal drip, sinus pressure, sneezing, sore throat and trouble swallowing.   Eyes: Negative for redness and itching.  Respiratory: Positive for cough and shortness of breath. Negative for chest tightness and wheezing.   Cardiovascular: Negative for palpitations and leg swelling.  Gastrointestinal: Positive for nausea and vomiting.  Genitourinary: Negative for dysuria.  Musculoskeletal: Negative for joint swelling.  Skin: Negative for rash.  Neurological: Negative for headaches.  Hematological: Does not bruise/bleed easily.  Psychiatric/Behavioral: Negative for dysphoric mood. The patient is not nervous/anxious.         Objective:   Physical Exam  Vitals:   05/26/17 1114 05/26/17 1115  BP:  124/86  Pulse:  71  SpO2:  96%  Weight: 199 lb (90.3 kg)    Gen: pleasant, overwt woman, in no distress,  normal affect  ENT: No lesions,  mouth clear,  oropharynx clear, no postnasal drip  Neck: No JVD, no stridor  Lungs: No use of accessory muscles, clear without rales or rhonchi  Cardiovascular: RRR, heart sounds normal, no murmur or gallops, no peripheral edema  Musculoskeletal: No deformities, no cyanosis or clubbing  Neuro: alert, non focal  Skin: Warm, no  lesions or rashes     Assessment & Plan:  Pulmonary nodule, right She needs a repeat CT scan of the chest in June 2019  TOBACCO ABUSE Discussed cessation with her  today.  She is working on this.  She is about to restart Chantix.  Hemoptysis Unclear whether this was true hemoptysis versus hematemesis.  It was transient and I suspect that it was due to harsh coughing, harsh emesis and a blood vessel injury.  She has had a reassuring endoscopy in January.  I believe she needs bronchoscopy for airway inspection to ensure no source of bleeding.  If this is reassuring then no other action needed.  If she had continues to have bleeding then I may need to refer her back to Dr. [name] to discuss.  Baltazar Apo, MD, PhD 05/26/2017, 11:35 AM [facility] [phone] or if no answer [phone]

## 2017-05-26 NOTE — Patient Instructions (Addendum)
Please continue your medications as you have been taking them.  We will arrange for a bronchoscopy, hopefully for next week to perform an airway inspection. I agree with restarting Chantix.  Continue to work hard on decreasing your cigarettes. We will repeat your CT scan of the chest without contrast in June to follow your pulmonary nodule. Follow with Dr [name] in 1 month or next available

## 2017-05-26 NOTE — Assessment & Plan Note (Signed)
Discussed cessation with her today.  She is working on this.  She is about to restart Chantix.

## 2017-05-26 NOTE — Assessment & Plan Note (Signed)
Unclear whether this was true hemoptysis versus hematemesis.  It was transient and I suspect that it was due to harsh coughing, harsh emesis and a blood vessel injury.  She has had a reassuring endoscopy in January.  I believe she needs bronchoscopy for airway inspection to ensure no source of bleeding.  If this is reassuring then no other action needed.  If she had continues to have bleeding then I may need to refer her back to Dr. [name] to discuss.

## 2017-05-31 ENCOUNTER — Ambulatory Visit (HOSPITAL_COMMUNITY)
Admission: RE | Admit: 2017-05-31 | Discharge: 2017-05-31 | Disposition: A | Discharge status: Home / Self Care | Payer: PPO | Source: Ambulatory Visit | Attending: Emergency Medicine | Admitting: Emergency Medicine

## 2017-05-31 ENCOUNTER — Encounter (HOSPITAL_COMMUNITY)
Admission: RE | Disposition: A | Discharge status: Home / Self Care | Payer: Self-pay | Source: Ambulatory Visit | Attending: Emergency Medicine

## 2017-05-31 DIAGNOSIS — F17210 Nicotine dependence, cigarettes, uncomplicated: Secondary | ICD-10-CM | POA: Insufficient documentation

## 2017-05-31 DIAGNOSIS — J449 Chronic obstructive pulmonary disease, unspecified: Secondary | ICD-10-CM | POA: Insufficient documentation

## 2017-05-31 DIAGNOSIS — R042 Hemoptysis: Secondary | ICD-10-CM | POA: Diagnosis not present

## 2017-05-31 DIAGNOSIS — R05 Cough: Secondary | ICD-10-CM | POA: Insufficient documentation

## 2017-05-31 DIAGNOSIS — G4733 Obstructive sleep apnea (adult) (pediatric): Secondary | ICD-10-CM | POA: Insufficient documentation

## 2017-05-31 DIAGNOSIS — R059 Cough, unspecified: Secondary | ICD-10-CM

## 2017-05-31 DIAGNOSIS — K219 Gastro-esophageal reflux disease without esophagitis: Secondary | ICD-10-CM | POA: Diagnosis not present

## 2017-05-31 DIAGNOSIS — R911 Solitary pulmonary nodule: Secondary | ICD-10-CM | POA: Insufficient documentation

## 2017-05-31 HISTORY — PX: VIDEO BRONCHOSCOPY: SHX5072

## 2017-05-31 LAB — BODY FLUID CELL COUNT WITH DIFFERENTIAL
Eos, Fluid: 1 %
Lymphs, Fluid: 2 %
MONOCYTE-MACROPHAGE-SEROUS FLUID: 94 % — AB (ref 50–90)
Neutrophil Count, Fluid: 3 % (ref 0–25)
Total Nucleated Cell Count, Fluid: 91 cu mm (ref 0–1000)

## 2017-05-31 SURGERY — VIDEO BRONCHOSCOPY WITHOUT FLUORO
Anesthesia: Moderate Sedation | Laterality: Bilateral

## 2017-05-31 MED ORDER — LIDOCAINE HCL 2 % EX GEL
CUTANEOUS | Status: DC | PRN
  Administered 2017-05-31: 1

## 2017-05-31 MED ORDER — FENTANYL CITRATE (PF) 100 MCG/2ML IJ SOLN
INTRAMUSCULAR | Status: AC
  Filled 2017-05-31: qty 4

## 2017-05-31 MED ORDER — FENTANYL CITRATE (PF) 100 MCG/2ML IJ SOLN
INTRAMUSCULAR | Status: DC | PRN
  Administered 2017-05-31 (×2): 25 ug via INTRAVENOUS
  Administered 2017-05-31: 50 ug via INTRAVENOUS

## 2017-05-31 MED ORDER — LIDOCAINE HCL 2 % EX GEL: 1.0000 "application " | Freq: Once | CUTANEOUS | Status: DC

## 2017-05-31 MED ORDER — LIDOCAINE HCL 1 % IJ SOLN
INTRAMUSCULAR | Status: DC | PRN
  Administered 2017-05-31: 6 mL via RESPIRATORY_TRACT

## 2017-05-31 MED ORDER — MIDAZOLAM HCL 10 MG/2ML IJ SOLN
INTRAMUSCULAR | Status: DC | PRN
  Administered 2017-05-31: 3 mg via INTRAVENOUS
  Administered 2017-05-31: 2 mg via INTRAVENOUS

## 2017-05-31 MED ORDER — MIDAZOLAM HCL 5 MG/ML IJ SOLN
INTRAMUSCULAR | Status: AC
  Filled 2017-05-31: qty 2

## 2017-05-31 MED ORDER — SODIUM CHLORIDE 0.9 % IV SOLN
Freq: Once | INTRAVENOUS | Status: AC
  Administered 2017-05-31: 07:00:00 via INTRAVENOUS

## 2017-05-31 MED ORDER — PHENYLEPHRINE HCL 0.25 % NA SOLN
NASAL | Status: DC | PRN
  Administered 2017-05-31: 2 via NASAL

## 2017-05-31 MED ORDER — PHENYLEPHRINE HCL 0.25 % NA SOLN: 1.0000 | Freq: Four times a day (QID) | NASAL | Status: DC | PRN

## 2017-05-31 NOTE — Discharge Instructions (Signed)
Flexible Bronchoscopy, Care After These instructions give you information on caring for yourself after your procedure. Your doctor may also give you more specific instructions. Call your doctor if you have any problems or questions after your procedure. Follow these instructions at home:  Do not eat or drink anything for 2 hours after your procedure. If you try to eat or drink before the medicine wears off, food or drink could go into your lungs. You could also burn yourself.  After 2 hours have passed and when you can cough and gag normally, you may eat soft food and drink liquids slowly.  The day after the test, you may eat your normal diet.  You may do your normal activities.  Keep all doctor visits. Get help right away if:  You get more and more short of breath.  You get light-headed.  You feel like you are going to pass out (faint).  You have chest pain.  You have new problems that worry you.  You cough up more than a little blood.  You cough up more blood than before.   Do not eat or drink anything until 09:00 am on 05/31/2017.  Please call our office for any questions or concerns. [phone].   This information is not intended to replace advice given to you by your health care provider. Make sure you discuss any questions you have with your health care provider. Document Released: 12/06/2008 Document Revised: 07/17/2015 Document Reviewed: 10/13/2012 Elsevier Interactive Patient Education  2017 [employer].

## 2017-05-31 NOTE — Op Note (Signed)
[facility] Cardiopulmonary Patient Name: [name] [name] Procedure Date: 05/31/2017 MRN: [id number] Attending MD: Collene Gobble , MD Date of Birth: August 05, 1960 CSN: [id number] Age: 57 Admit Type: Outpatient Ethnicity: Not Hispanic or Latino Procedure:            Bronchoscopy Indications:          Hemoptysis with normal CXR, Chronic cough with abnormal                        CT Providers:            Collene Gobble, MD, Ashley Mariner RRT,RCP, Phillis Knack                        RRT, RCP Referring MD:          Medicines:            Fentanyl 100 mcg IV, Midazolam 5 mg IV, Lidocaine 1%                        applied to cords 12 mL, Lidocaine 1% applied to the                        tracheobronchial tree 16 mL Complications:        No immediate complications Estimated Blood Loss: Estimated blood loss: none. Procedure:      Pre-Anesthesia Assessment:      - A History and Physical has been performed. Patient meds and allergies       have been reviewed. The risks and benefits of the procedure and the       sedation options and risks were discussed with the patient. All       questions were answered and informed consent was obtained. Patient       identification and proposed procedure were verified prior to the       procedure by the physician in the procedure room. Mental Status       Examination: alert and oriented. Airway Examination: normal       oropharyngeal airway. Respiratory Examination: clear to auscultation. CV       Examination: RRR, no murmurs, no S3 or S4. ASA Grade Assessment: I - A       normal healthy patient. After reviewing the risks and benefits, the       patient was deemed in satisfactory condition to undergo the procedure.       The anesthesia plan was to use moderate sedation / analgesia (conscious       sedation). Immediately prior to administration of medications, the       patient was re-assessed for adequacy to receive sedatives. The heart   rate, respiratory rate, oxygen saturations, blood pressure, adequacy of       pulmonary ventilation, and response to care were monitored throughout       the procedure. The physical status of the patient was re-assessed after       the procedure.      After obtaining informed consent, the bronchoscope was passed under       direct vision. Throughout the procedure, the patient's blood pressure,       pulse, and oxygen saturations were monitored continuously. the [id number]       ([id number]) scope was introduced through the left nostril and advanced to       the tracheobronchial  tree. The procedure was accomplished without       difficulty. The patient tolerated the procedure well. The total duration       of the procedure was 20 minutes. Findings:      The nasopharynx/oropharynx appears normal. The larynx appears normal.       The vocal cords appear normal. The subglottic space is normal. The       trachea is of normal caliber. The carina is sharp. The tracheobronchial       tree was examined to at least the first subsegmental level. Bronchial       mucosa and anatomy are normal; there are no endobronchial lesions, and       no secretions.      Bronchoalveolar lavage was performed in the RUL anterior segment (B3) of       the lung and sent for cell count, bacterial culture, viral smears &       culture, and fungal & AFB analysis and cytology. 60 mL of fluid were       instilled. 20 mL were returned. The return was clear. There were no       mucoid plugs in the return fluid. Impression:      - Hemoptysis with normal CXR      - Chronic cough with abnormal CT      - The airway examination was normal.      - Bronchoalveolar lavage was performed. Moderate Sedation:      Moderate (conscious) sedation was personally administered by the       pulmonologist. The following parameters were monitored: oxygen       saturation, heart rate, blood pressure, respiratory rate, EKG, adequacy       of  pulmonary ventilation, and response to care. Total physician       intraservice time was 20 minutes. Recommendation:      - Await BAL results. Procedure Code(s):      --- Professional ---      [phone], Bronchoscopy, rigid or flexible, including fluoroscopic guidance,       when performed; with bronchial alveolar lavage      99152, Moderate sedation services provided by the same physician or       other qualified health care professional performing the diagnostic or       therapeutic service that the sedation supports, requiring the presence       of an independent trained observer to assist in the monitoring of the       patient's level of consciousness and physiological status; initial 15       minutes of intraservice time, patient age 57 years or older Diagnosis Code(s):      --- Professional ---      R04.2, Hemoptysis      R05, Cough      R91.8, Other nonspecific abnormal finding of lung field CPT copyright 2017 [employer]. All rights reserved. The codes documented in this report are preliminary and upon coder review may  be revised to meet current compliance requirements. Collene Gobble, MD Collene Gobble, MD 05/31/2017 8:15:13 AM Number of Addenda: 0 Scope In: 8:11:88 AM Scope Out: 7:56:03 AM

## 2017-05-31 NOTE — Progress Notes (Signed)
Video bronchoscopy performed.  Intervention bronchial washing.   No complications noted.  Will continue to monitor. 

## 2017-05-31 NOTE — Interval H&P Note (Signed)
PCCM Interval Note  Pt presents today for further eval of her cough and episode hemoptysis. No further hemoptysis noted since our visit. She understands the procedure, risks, benefits. All questions answered. No barriers to proceeding.   Baltazar Apo, MD, PhD 05/31/2017, 7:39 AM  Pulmonary and Critical Care [phone] or if no answer [phone]

## 2017-06-01 ENCOUNTER — Encounter (HOSPITAL_COMMUNITY): Payer: Self-pay | Admitting: Emergency Medicine

## 2017-06-01 LAB — ACID FAST SMEAR (AFB, MYCOBACTERIA)

## 2017-06-01 LAB — ACID FAST SMEAR (AFB): ACID FAST SMEAR - AFSCU2: NEGATIVE

## 2017-06-02 LAB — CULTURE, BAL-QUANTITATIVE: CULTURE: NORMAL — AB

## 2017-06-02 LAB — CULTURE, BAL-QUANTITATIVE W GRAM STAIN: Special Requests: NORMAL

## 2017-06-09 ENCOUNTER — Other Ambulatory Visit: Payer: PPO

## 2017-06-09 ENCOUNTER — Other Ambulatory Visit: Payer: Self-pay | Admitting: Internal Medicine

## 2017-06-09 NOTE — Telephone Encounter (Signed)
Patient states she needs a refill of the phenergan suppository. Patient last seen 02-2017.

## 2017-06-13 ENCOUNTER — Telehealth: Payer: Self-pay | Admitting: Emergency Medicine

## 2017-06-13 MED ORDER — HYDROCODONE-HOMATROPINE 5-1.5 MG/5ML PO SYRP: 5.0000 mL | ORAL_SOLUTION | Freq: Four times a day (QID) | ORAL | 0 refills | Status: DC | PRN

## 2017-06-13 NOTE — Telephone Encounter (Signed)
Pt requesting a refill on hycodan cough syrup.  Pt was last prescribed this in January by our office- #130mL with 0 refills.  Sending to DOD as RB is working [employer] and is unavailable- VS please advise if ok to prescribe.  Thanks!

## 2017-06-13 NOTE — Telephone Encounter (Signed)
Patient walked in after 5:00 to office for prescription- nobody called patient to pick up rx as we have not yet heard from the doctor regarding rx approval. Spoke with VS (dod) who approved rx refill.  rx has been printed, signed, and given to patient.  Nothing further needed.

## 2017-06-13 NOTE — Telephone Encounter (Signed)
Pt is calling back. Cb is [phone]

## 2017-06-14 ENCOUNTER — Telehealth: Payer: Self-pay | Admitting: Emergency Medicine

## 2017-06-14 DIAGNOSIS — G894 Chronic pain syndrome: Secondary | ICD-10-CM | POA: Diagnosis not present

## 2017-06-14 DIAGNOSIS — Z5181 Encounter for therapeutic drug level monitoring: Secondary | ICD-10-CM | POA: Diagnosis not present

## 2017-06-14 DIAGNOSIS — M5136 Other intervertebral disc degeneration, lumbar region: Secondary | ICD-10-CM | POA: Diagnosis not present

## 2017-06-14 DIAGNOSIS — M7060 Trochanteric bursitis, unspecified hip: Secondary | ICD-10-CM | POA: Diagnosis not present

## 2017-06-14 NOTE — Telephone Encounter (Signed)
ATC pt, no answer. Left message for pt to call back. I reviewed her bronch procedure and she was given Fentanyl during her bronch so this may have been what showed up in her urine test.     Dr. [name], can you review bronch results.

## 2017-06-14 NOTE — Telephone Encounter (Signed)
Called and spoke to patient. Patient stated that her UDS was positive for Percocet. She wanted to know if anything she was given for the bronch could have caused the positive result for percocet. Reviewed with patient the medications including Fentanyl that was given during bronch but that she did not receive any percocet. Offered to print patient a copy of the medications given during procedure but patient denied needing that. She thanked staff for calling her back and looking into it. Nothing further needed at this time.

## 2017-06-14 NOTE — Telephone Encounter (Signed)
Patient returned call, CB is [phone]

## 2017-06-15 ENCOUNTER — Other Ambulatory Visit: Payer: PPO

## 2017-06-16 ENCOUNTER — Ambulatory Visit
Admission: RE | Admit: 2017-06-16 | Discharge: 2017-06-16 | Disposition: A | Discharge status: Home / Self Care | Payer: PPO | Source: Ambulatory Visit | Attending: Family | Admitting: Family

## 2017-06-16 DIAGNOSIS — R7989 Other specified abnormal findings of blood chemistry: Secondary | ICD-10-CM | POA: Diagnosis not present

## 2017-06-16 DIAGNOSIS — R945 Abnormal results of liver function studies: Principal | ICD-10-CM

## 2017-06-17 ENCOUNTER — Telehealth: Payer: Self-pay | Admitting: Nurse Practitioner

## 2017-06-17 ENCOUNTER — Ambulatory Visit: Payer: PPO | Admitting: Family

## 2017-06-17 ENCOUNTER — Other Ambulatory Visit: Payer: Self-pay | Admitting: Family

## 2017-06-17 DIAGNOSIS — K7460 Unspecified cirrhosis of liver: Secondary | ICD-10-CM

## 2017-06-17 NOTE — Telephone Encounter (Signed)
Please advise. Thanks.  

## 2017-06-17 NOTE — Telephone Encounter (Signed)
Copied from [place]. Topic: Quick Communication - Appointment Cancellation >> Jun 17, 2017 10:09 AM Margot Ables wrote: Reason for CRM: pt was not able to come in for appt this morning. pt car blocked in and she cannot get her car out. She states she has been on hold over 30 minutes. Pt has rescheduled for 06/20/17 10:40am with [name], NP. She is requesting a call today to give her feedback on [place]. She is very nervous about it.

## 2017-06-17 NOTE — Telephone Encounter (Signed)
Spoke with patient today and info given. She needed to move apt from 4/29 to 5/3 so apt moved.

## 2017-06-17 NOTE — Telephone Encounter (Signed)
There are some changes on the ultrasound that are concerning for fatty liver or even early cirrhosis; I am going to refer her to a liver specialist for further evaluation.

## 2017-06-20 ENCOUNTER — Ambulatory Visit: Payer: PPO | Admitting: Family

## 2017-06-20 ENCOUNTER — Telehealth: Payer: Self-pay | Admitting: Emergency Medicine

## 2017-06-20 NOTE — Telephone Encounter (Signed)
Patient returned call, she hung up before I could get her call back number.

## 2017-06-20 NOTE — Telephone Encounter (Signed)
Per RB >> We will not be filling this prescription for her. This is documented in my notes. No one should be filling this for her.  Spoke with pt. She is aware of the above information. Nothing further was needed.

## 2017-06-20 NOTE — Telephone Encounter (Signed)
Called and spoke with patient, she states that she still has a cough and is wanting more hycodan syrup.    RB please advise on this, thanks.

## 2017-06-20 NOTE — Telephone Encounter (Signed)
Called patient unable to reach left message to give us a call back.

## 2017-06-22 ENCOUNTER — Encounter

## 2017-06-22 ENCOUNTER — Ambulatory Visit: Payer: PPO | Admitting: Nurse Practitioner

## 2017-06-22 DIAGNOSIS — G894 Chronic pain syndrome: Secondary | ICD-10-CM | POA: Diagnosis not present

## 2017-06-22 DIAGNOSIS — M5136 Other intervertebral disc degeneration, lumbar region: Secondary | ICD-10-CM | POA: Diagnosis not present

## 2017-06-22 DIAGNOSIS — Z5181 Encounter for therapeutic drug level monitoring: Secondary | ICD-10-CM | POA: Diagnosis not present

## 2017-06-22 DIAGNOSIS — M7060 Trochanteric bursitis, unspecified hip: Secondary | ICD-10-CM | POA: Diagnosis not present

## 2017-06-23 ENCOUNTER — Ambulatory Visit: Payer: PPO | Admitting: Emergency Medicine

## 2017-06-23 ENCOUNTER — Encounter: Payer: Self-pay | Admitting: Emergency Medicine

## 2017-06-23 DIAGNOSIS — R911 Solitary pulmonary nodule: Secondary | ICD-10-CM

## 2017-06-23 DIAGNOSIS — R05 Cough: Secondary | ICD-10-CM

## 2017-06-23 DIAGNOSIS — R059 Cough, unspecified: Secondary | ICD-10-CM

## 2017-06-23 MED ORDER — HYDROCODONE-HOMATROPINE 5-1.5 MG/5ML PO SYRP: 5.0000 mL | ORAL_SOLUTION | Freq: Four times a day (QID) | ORAL | 0 refills | Status: DC | PRN

## 2017-06-23 NOTE — Progress Notes (Signed)
Subjective:    Patient ID: [name] [name], female    DOB: August 17, 1960, 57 y.o.   MRN: [id number]  Cough  Associated symptoms include rhinorrhea and shortness of breath. Pertinent negatives include no ear pain, eye redness, fever, headaches, postnasal drip, rash, sore throat or wheezing.   57 year old smoker (20+ pack years), history of obesity, GERD. She has been followed in the past in our office for chronic cough. This was felt to be mostly upper airway in nature, was ascribed to GERD / chronic LPR. She was treated with cough suppression, narcotic cough syrup. She was on Reglan, not currently. She is currently on Dexilant. Then beginning about 1 yr ago she started to have more and more cough, has evolved paroxysms that are associated with SOB. She wonders if her reflux id well controlled, also notes that her Norco was decreased about 1 year ago. She wonders if that is part of the reason. She has not benefited from tessalon in the past. She is on fluticasone NS qd. Cough usually non-productive, occasionally thick white. She is occasionally SOB with exertion, household chores.   She underwent a sleep study 03/09/14 that show evidence for obstructive sleep apnea. I don't see any in PFT testing in the chart.                                        ROV 02/28/17 --this is a follow-up visit.  Patient is 57, continues to smoke approximately 3-4 cig a day, without any documented obstruction by spirometry.  We have followed her for a history of severe chronic cough, and a right upper lobe lobulated pulmonary nodule.  We repeated her CT scan of the chest 02/17/17 and I have reviewed.  This shows that the nodule is stable in size without any significant change in shape.  She returns today with continued cough. She is on flonase, Dexilant, loratadine. She is out of hycodan. She is to see Allergy in February.            ROV 05/26/17 --follow-up visit for patient with a history of tobacco use and obstructive lung  disease.  She also has severe chronic cough and pulmonary nodule that we have been following with serial CT scans of the chest, stable 02/17/2017.  She is not currently on scheduled bronchodilators.  She uses albuterol approximately.  She has a hx of frequent nausea and emesis, GERD. Has been followed by Dr [name]. She is on Flonase, Dexilant, Norco.  She tells me that she had an episode about 2 weeks ago when she saw blood after an episode of coughing and then some concomitant emesis. It was bright red, self limited. She had a reassuring EGD in January, has never had FOB. She is smoking a bit more, is about top go back on Chantix.                ROV 06/23/17 --57 year old woman who follows up today for her history of COPD, continued tobacco use, severe chronic cough that is been debilitating.  She has frequent GERD and nausea that are contributors, allergic rhinitis it is a contributor.  She has a right upper lobe pulmonary nodule that we followed with serial CT scans, stable on 02/17/2017.  We performed bronchoscopy 05/31/2017 because she had continued cough and also some episodic hemoptysis.  Her airway evaluation was normal.  AFB, fungal, cell count, cytology all  normal.  She continues to have persistent cough. She is using her nasal steroid and nasal washes. She ran out of benadryl, has used some loratadine. She is awakened at night with cough. Had her dexilant increased to 60mg . She doesn';t feel any burning.  She no longer has her norco/ vicodin until next week. She is only smoking a few cigarettes a week.                                                                                                                                               Review of Systems  Constitutional: Negative for fever and unexpected weight change.  HENT: Positive for rhinorrhea. Negative for congestion, dental problem, ear pain, nosebleeds, postnasal drip, sinus pressure, sneezing, sore throat and trouble swallowing.   Eyes:  Negative for redness and itching.  Respiratory: Positive for cough and shortness of breath. Negative for chest tightness and wheezing.   Cardiovascular: Negative for palpitations and leg swelling.  Gastrointestinal: Positive for nausea and vomiting.  Genitourinary: Negative for dysuria.  Musculoskeletal: Negative for joint swelling.  Skin: Negative for rash.  Neurological: Negative for headaches.  Hematological: Does not bruise/bleed easily.  Psychiatric/Behavioral: Negative for dysphoric mood. The patient is not nervous/anxious.         Objective:   Physical Exam  Vitals:   06/23/17 1104  BP: 120/88  Pulse: 92  SpO2: 97%  Weight: 189 lb 12.8 oz (86.1 kg)  Height: 5\' 5"  (1.651 m)   Gen: pleasant, overwt woman, in no distress,  normal affect  ENT: No lesions,  mouth clear,  oropharynx clear, no postnasal drip  Neck: No JVD, no stridor  Lungs: No use of accessory muscles, clear without rales or rhonchi  Cardiovascular: RRR, heart sounds normal, no murmur or gallops, no peripheral edema  Musculoskeletal: No deformities, no cyanosis or clubbing  Neuro: alert, non focal  Skin: Warm, no lesions or rashes     Assessment & Plan:  Pulmonary nodule, right She needs CT chest in June 2019  Cough Continues to have refractory cough.  GERD and allergic rhinitis of the biggest contributors.  We have made efforts to treat both.  I will try to avoid giving her any further narcotic cough syrup because I do not believe it ultimately will address the underlying contributors or stop the problem.  She is somewhat desperate for symptom control.  She typically gets good control when she is on her Norco but she does not have any right now and will not get until next week.  I have offered  her prednisone, Ultram, and she states that these are ineffective.  I do not know what else to do for her other than refill the Hycodan cough syrup.  I am not sure that I can continue to do this in good  conscience indefinitely.  I may have to get her  to see somebody else or get a second opinion if we are in an impasse.  Please continue your current medications as you are taking them, including your increased dose of Dexilant. Continue either Benadryl or loratadine once a day. Continue your nasal steroid spray and your nasal washes Keep albuterol available to use 2 puffs if needed for shortness of breath or coughing spells.  Get your Norco refilled at the pain clinic as planned. Congratulations on decreasing your smoking.  Take hycodan 5cc at bedtime to suppress cough if needed. You will have to show this prescription to the pain clinic. Follow with Dr [name] in 3 months or sooner if you have any problems.  Baltazar Apo, MD, PhD 06/23/2017, 1:25 PM [facility] [phone] or if no answer [phone]

## 2017-06-23 NOTE — Assessment & Plan Note (Signed)
She needs CT chest in June 2019

## 2017-06-23 NOTE — Assessment & Plan Note (Signed)
Continues to have refractory cough.  GERD and allergic rhinitis of the biggest contributors.  We have made efforts to treat both.  I will try to avoid giving her any further narcotic cough syrup because I do not believe it ultimately will address the underlying contributors or stop the problem.  She is somewhat desperate for symptom control.  She typically gets good control when she is on her Norco but she does not have any right now and will not get until next week.  I have offered  her prednisone, Ultram, and she states that these are ineffective.  I do not know what else to do for her other than refill the Hycodan cough syrup.  I am not sure that I can continue to do this in good conscience indefinitely.  I may have to get her to see somebody else or get a second opinion if we are in an impasse.  Please continue your current medications as you are taking them, including your increased dose of Dexilant. Continue either Benadryl or loratadine once a day. Continue your nasal steroid spray and your nasal washes Keep albuterol available to use 2 puffs if needed for shortness of breath or coughing spells.  Get your Norco refilled at the pain clinic as planned. Congratulations on decreasing your smoking.  Take hycodan 5cc at bedtime to suppress cough if needed. You will have to show this prescription to the pain clinic. Follow with Dr [name] in 3 months or sooner if you have any problems.

## 2017-06-23 NOTE — Patient Instructions (Addendum)
Please continue your current medications as you are taking them, including your increased dose of Dexilant. Continue either Benadryl or loratadine once a day. Continue your nasal steroid spray and your nasal washes Keep albuterol available to use 2 puffs if needed for shortness of breath or coughing spells.  Get your Norco refilled at the pain clinic as planned. Congratulations on decreasing your smoking.  Take hycodan 5cc at bedtime to suppress cough if needed. You will have to show this prescription to the pain clinic. Follow with Dr [name] in 3 months or sooner if you have any problems.

## 2017-06-24 ENCOUNTER — Ambulatory Visit: Payer: PPO | Admitting: Emergency Medicine

## 2017-06-24 ENCOUNTER — Ambulatory Visit (INDEPENDENT_AMBULATORY_CARE_PROVIDER_SITE_OTHER): Payer: PPO | Admitting: Family

## 2017-06-24 ENCOUNTER — Other Ambulatory Visit (INDEPENDENT_AMBULATORY_CARE_PROVIDER_SITE_OTHER): Payer: PPO

## 2017-06-24 ENCOUNTER — Encounter: Payer: Self-pay | Admitting: Family

## 2017-06-24 VITALS — BP 142/90 | HR 93 | Temp 99.4°F | Ht 65.0 in | Wt 189.1 lb

## 2017-06-24 DIAGNOSIS — R300 Dysuria: Secondary | ICD-10-CM | POA: Diagnosis not present

## 2017-06-24 DIAGNOSIS — F1190 Opioid use, unspecified, uncomplicated: Secondary | ICD-10-CM | POA: Diagnosis not present

## 2017-06-24 DIAGNOSIS — T148XXA Other injury of unspecified body region, initial encounter: Secondary | ICD-10-CM

## 2017-06-24 DIAGNOSIS — R945 Abnormal results of liver function studies: Secondary | ICD-10-CM

## 2017-06-24 DIAGNOSIS — R7989 Other specified abnormal findings of blood chemistry: Secondary | ICD-10-CM

## 2017-06-24 DIAGNOSIS — E782 Mixed hyperlipidemia: Secondary | ICD-10-CM

## 2017-06-24 LAB — COMPREHENSIVE METABOLIC PANEL
ALBUMIN: 3.8 g/dL (ref 3.5–5.2)
ALT: 52 U/L — ABNORMAL HIGH (ref 0–35)
AST: 130 U/L — ABNORMAL HIGH (ref 0–37)
Alkaline Phosphatase: 145 U/L — ABNORMAL HIGH (ref 39–117)
BUN: 6 mg/dL (ref 6–23)
CHLORIDE: 98 meq/L (ref 96–112)
CO2: 27 meq/L (ref 19–32)
CREATININE: 0.64 mg/dL (ref 0.40–1.20)
Calcium: 9.3 mg/dL (ref 8.4–10.5)
GFR: 101.87 mL/min (ref >60.00)
Glucose, Bld: 160 mg/dL — ABNORMAL HIGH (ref 70–99)
Potassium: 3.4 mEq/L — ABNORMAL LOW (ref 3.5–5.1)
Sodium: 137 mEq/L (ref 135–145)
Total Bilirubin: 2.2 mg/dL — ABNORMAL HIGH (ref 0.2–1.2)
Total Protein: 7.7 g/dL (ref 6.0–8.3)

## 2017-06-24 LAB — CBC WITH DIFFERENTIAL/PLATELET
BASOS PCT: 1.3 % (ref 0.0–3.0)
Basophils Absolute: 0.1 10*3/uL (ref 0.0–0.1)
EOS ABS: 0.2 10*3/uL (ref 0.0–0.7)
EOS PCT: 2 % (ref 0.0–5.0)
HEMATOCRIT: 39.7 % (ref 36.0–46.0)
HEMOGLOBIN: 13.7 g/dL (ref 12.0–15.0)
Lymphocytes Relative: 23.1 % (ref 12.0–46.0)
Lymphs Abs: 1.9 10*3/uL (ref 0.7–4.0)
MCHC: 34.6 g/dL (ref 30.0–36.0)
MCV: 108.5 fl — ABNORMAL HIGH (ref 78.0–100.0)
Monocytes Absolute: 0.7 10*3/uL (ref 0.1–1.0)
Monocytes Relative: 8.4 % (ref 3.0–12.0)
NEUTROS ABS: 5.3 10*3/uL (ref 1.4–7.7)
Neutrophils Relative %: 65.2 % (ref 43.0–77.0)
Platelets: 222 10*3/uL (ref 150.0–400.0)
RBC: 3.66 Mil/uL — ABNORMAL LOW (ref 3.87–5.11)
RDW: 13.2 % (ref 11.5–15.5)
WBC: 8.2 10*3/uL (ref 4.0–10.5)

## 2017-06-24 LAB — LIPID PANEL
CHOL/HDL RATIO: 4
Cholesterol: 278 mg/dL — ABNORMAL HIGH (ref 0–200)
HDL: 77.3 mg/dL (ref >39.00)
LDL CALC: 177 mg/dL — AB (ref 0–99)
NONHDL: 201.14
TRIGLYCERIDES: 123 mg/dL (ref 0.0–149.0)
VLDL: 24.6 mg/dL (ref 0.0–40.0)

## 2017-06-24 NOTE — Progress Notes (Signed)
[name] [name] is a 57 y.o. female with the following history as recorded in Epic[employer]:  Patient Active Problem List   Diagnosis Date Noted  . Hemoptysis 05/26/2017  . Pulmonary nodule, right 11/29/2016  . Polyarthralgia 12/02/2015  . Varicose veins of both lower extremities 12/02/2015  . Hypertensive retinopathy of both eyes 11/24/2015  . Elevated liver enzymes 10/31/2015  . Hyperglycemia 10/31/2015  . SIRS (systemic inflammatory response syndrome) ([place]) 03/11/2013  . Hypertension   . Hyperlipidemia   . Anemia   . Hematochezia 08/14/2012  . ETOH abuse 08/14/2012  . Pseudocyst of pancreas 08/12/2012  . Acute pancreatitis 08/12/2012  . GERD (gastroesophageal reflux disease) 06/08/2012  . Rectal fissure 05/11/2011  . Rectal fistula 05/11/2011  . Hiatal hernia 04/28/2011  . Gastritis 04/28/2011  . Nausea 04/27/2011  . Acute epigastric pain 04/27/2011  . Obesity, Class III, BMI 40-49.9 (morbid obesity) ([place]) 04/27/2011  . Solitary rectal ulcer 04/12/2011  . Hemorrhoids 03/10/2011  . Ulcerative proctitis, nonspecific ([place]) 03/10/2011  . DIVERTICULOSIS, COLON 10/22/2009  . COLONIC POLYPS, HYPERPLASTIC, HX OF 10/22/2009  . INFECTIOUS DIARRHEA 09/27/2008  . ULCERATIVE PROCTITIS 09/27/2008  . NAUSEA AND VOMITING 09/27/2008  . NAUSEA ALONE 09/27/2008  . DIARRHEA 09/27/2008  . ABDOMINAL PAIN -GENERALIZED 09/27/2008  . DEPRESSION 09/12/2007  . HEMORRHOIDS 09/12/2007  . HIATAL HERNIA 09/12/2007  . ULCERATIVE COLITIS 09/12/2007  . RECTAL PAIN 09/12/2007  . BIPOLAR AFFECTIVE DISORDER 12/06/2006  . TOBACCO ABUSE 12/06/2006  . Esophageal reflux 12/06/2006  . Cough 12/06/2006    Current Outpatient Medications  Medication Sig Dispense Refill  . albuterol (PROAIR HFA) 108 (90 Base) MCG/ACT inhaler Inhale 2 puffs into the lungs every 4 (four) hours as needed for wheezing or shortness of breath. 1 Inhaler 5  . ARIPiprazole (ABILIFY) 15 MG tablet Take 15 mg by mouth 2 (two) times daily.      . benzonatate (TESSALON) 200 MG capsule Take 1 capsule (200 mg total) by mouth every 6 (six) hours as needed for cough. 120 capsule 1  . dexlansoprazole (DEXILANT) 60 MG capsule Take 1 capsule (60 mg total) by mouth daily. 30 capsule 6  . diclofenac sodium (VOLTAREN) 1 % GEL Apply 2 g topically 4 (four) times daily.     Marland Kitchen FLUoxetine (PROZAC) 20 MG capsule Take 20 mg by mouth 2 (two) times daily.     . fluticasone (FLONASE) 50 MCG/ACT nasal spray instill 2 sprays into each nostril once daily (Patient taking differently: Place 2 sprays into both nostrils. ) 16 g 5  . HYDROcodone-acetaminophen (NORCO/VICODIN) 5-325 MG tablet Take 1 tablet by mouth 4 (four) times daily as needed for moderate pain.     Marland Kitchen HYDROcodone-homatropine (HYCODAN) 5-1.5 MG/5ML syrup Take 5 mLs by mouth every 6 (six) hours as needed for cough. 60 mL 0  . ketoconazole (NIZORAL) 2 % cream Apply 1 application topically 2 (two) times daily. 15 g 0  . lamoTRIgine (LAMICTAL) 100 MG tablet Take 100 mg by mouth 2 (two) times daily.     . methocarbamol (ROBAXIN) 500 MG tablet Take 500 mg by mouth 2 (two) times daily as needed for muscle spasms.     . Multiple Vitamin (MULTIVITAMIN WITH MINERALS) TABS tablet Take 1 tablet by mouth daily. 30 tablet 0  . NARCAN 4 MG/0.1ML LIQD nasal spray kit Place 0.4 mg into the nose once.     . promethazine (PHENERGAN) 25 MG suppository Place 1 suppository (25 mg total) rectally every 6 (six) hours as needed for nausea  or vomiting. 1 each 0  . promethazine (PHENERGAN) 25 MG suppository PLACE 1 SUPPOSITORY RECTALLY EVERY 6 HOURS AS NEEDED FOR NAUSEA OR VOMITING 30 suppository 0  . Tetrahydrozoline HCl (VISINE OP) Place 1-2 drops into both eyes daily.    . varenicline (CHANTIX PAK) 0.5 MG X 11 & 1 MG X 42 tablet Take one 0.5 mg tablet by mouth once daily for 3 days, then increase to one 0.5 mg tablet twice daily for 4 days, then increase to one 1 mg tablet twice daily. 53 tablet 0   Current  Facility-Administered Medications  Medication Dose Route Frequency Provider Last Rate Last Dose  . 0.9 %  sodium chloride infusion  500 mL Intravenous Once Irene Shipper, MD        Allergies: Patient has no known allergies.  Past Medical History:  Diagnosis Date  . Anal fistula   . Anemia   . Anxiety   . Arthritis   . Bipolar disorder ([place])   . Chronic kidney disease   . Depressive disorder, not elsewhere classified   . Diverticulosis of colon (without mention of hemorrhage)   . Esophageal reflux   . GERD (gastroesophageal reflux disease)   . Hepatic steatosis   . Hiatal hernia   . History of recurrent UTIs   . Hyperlipidemia   . Hypertension   . Iron deficiency   . Obesity   . Pancreatitis   . Pancreatitis   . Personal history of colonic polyps 10/16/2009   hyperplastic  . Pre-diabetes   . Seizures ([place])   . Ulcerative colitis, unspecified     Past Surgical History:  Procedure Laterality Date  . FINGER AMPUTATION  2010   right index  . HEMORROIDECTOMY    . KNEE ARTHROSCOPY     bilateral, left x2  . TOTAL ABDOMINAL HYSTERECTOMY    . VIDEO BRONCHOSCOPY Bilateral 05/31/2017   Procedure: VIDEO BRONCHOSCOPY WITHOUT FLUORO;  Surgeon: Collene Gobble, MD;  Location: [facility];  Service: Cardiopulmonary;  Laterality: Bilateral;    Family History  Problem Relation Age of Onset  . Irritable bowel syndrome Mother   . Hypertension Mother   . Atrial fibrillation Mother   . Ovarian cancer Maternal Aunt   . Diabetes Father   . Heart attack Father   . Hypertension Father   . Hyperlipidemia Father   . Thyroid disease Sister   . Hyperlipidemia Sister   . Thyroid disease Brother   . Colon polyps Brother   . Stomach cancer Neg Hx   . Colon cancer Neg Hx     Social History   Tobacco Use  . Smoking status: Current Every Day Smoker    Packs/day: 0.25    Types: Cigarettes  . Smokeless tobacco: Never Used  . Tobacco comment: pt states she smokes 3 cigarettes/ day   Substance Use Topics  . Alcohol use: Yes    Alcohol/week: 0.6 oz    Types: 1 Shots of liquor per week    Comment: occ    Subjective:  Patient would like to review recent ultrasound and discuss elevated liver functions; admits that she has probably been drinking too much alcohol to help cope with loss of numerous family members; since she has been made aware of results, she has been working on limiting intake of Tylenol and alcohol; Is still concerned about excessive bruising; [place]; Was recently found to have an unexplained + screen for Percocet of her  drug test with her pain management provider; asking if she could have drug screen here for comparison; she is worried there is a false positive on the test;     Objective:  Vitals:   06/24/17 1047  BP: (!) 142/90  Pulse: 93  Temp: 99.4 F (37.4 C)  TempSrc: Oral  SpO2: 95%  Weight: 189 lb 1.9 oz (85.8 kg)  Height: '5\' 5"'$  (1.651 m)    General: Well developed, well nourished, in no acute distress  Skin : Warm and dry.  Head: Normocephalic and atraumatic  Lungs: Respirations unlabored; clear to auscultation bilaterally without wheeze, rales, rhonchi  CVS exam: normal rate and regular rhythm.  Neurologic: Alert and oriented; speech intact; face symmetrical; moves all extremities well; CNII-XII intact without focal deficit   Assessment:  1. Bruising   2. Mixed hyperlipidemia   3. Dysuria   4. Elevated LFTs   5. Chronic narcotic use     Plan:  1. Repeat CBC today; 2. Update lipid panel- this was not done with last set of labs; if statins are indicated, will need to get approval from liver specialist before starting; 3. Check urine culture today; 4. Reviewed ultrasound- stressed need to stop alcohol completely; keep planned follow-up with liver specialist; check hepatitis panel as well today; 5. Check drug screen per her request;    No follow-ups on file.  Orders Placed This  Encounter  Procedures  . Urine Culture    Standing Status:   Future    Number of Occurrences:   1    Standing Expiration Date:   06/24/2018  . Lipid panel    Standing Status:   Future    Number of Occurrences:   1    Standing Expiration Date:   06/25/2018  . CBC w/Diff    Standing Status:   Future    Number of Occurrences:   1    Standing Expiration Date:   06/24/2018  . Hepatitis panel, acute    Standing Status:   Future    Number of Occurrences:   1    Standing Expiration Date:   06/25/2018  . Comp Met (CMET)    Standing Status:   Future    Number of Occurrences:   1    Standing Expiration Date:   06/24/2018  . Pain Mgmt, Profile 8 w/Conf, U    Standing Status:   Future    Number of Occurrences:   1    Standing Expiration Date:   06/25/2018    Requested Prescriptions    No prescriptions requested or ordered in this encounter

## 2017-06-27 ENCOUNTER — Telehealth: Payer: Self-pay | Admitting: Family

## 2017-06-27 ENCOUNTER — Telehealth: Payer: Self-pay | Admitting: Emergency Medicine

## 2017-06-27 LAB — HEPATITIS PANEL, ACUTE
HEP B S AG: NONREACTIVE
Hep A IgM: NONREACTIVE
Hep B C IgM: NONREACTIVE
Hepatitis C Ab: NONREACTIVE
SIGNAL TO CUT-OFF: 0.05 (ref <1.00)

## 2017-06-27 NOTE — Telephone Encounter (Signed)
Copied from [place] [id number]. Topic: Quick Communication - See Telephone Encounter >> Jun 27, 2017 12:48 PM Vernona Rieger wrote: CRM for notification. See Telephone encounter for: 06/27/17.  Patient states she had lab work done on 5/3. She would like the results [phone]

## 2017-06-27 NOTE — Telephone Encounter (Signed)
Called and spoke with patient, she was wanting to know what medications were given to her during the [place]. I advised patient that both fentanyl and versad were given to her during the procedure. Patient asked for [place] to write a letter stating that we give permission to give her pain medication since benzos showed up on the UDS. I advised patient that if the office needed anything from [place] they could call us but we could not write the letter without speaking to someone from the office.

## 2017-06-28 DIAGNOSIS — M7060 Trochanteric bursitis, unspecified hip: Secondary | ICD-10-CM | POA: Diagnosis not present

## 2017-06-28 DIAGNOSIS — Z5181 Encounter for therapeutic drug level monitoring: Secondary | ICD-10-CM | POA: Diagnosis not present

## 2017-06-28 DIAGNOSIS — G894 Chronic pain syndrome: Secondary | ICD-10-CM | POA: Diagnosis not present

## 2017-06-28 DIAGNOSIS — M5136 Other intervertebral disc degeneration, lumbar region: Secondary | ICD-10-CM | POA: Diagnosis not present

## 2017-06-28 LAB — PAIN MGMT, PROFILE 8 W/CONF, U

## 2017-06-28 LAB — URINE CULTURE
MICRO NUMBER:: 90542570
SPECIMEN QUALITY:: ADEQUATE

## 2017-06-28 NOTE — Telephone Encounter (Signed)
Still waiting on the results of her drug screen; will let her know when we get those back;

## 2017-06-28 NOTE — Telephone Encounter (Signed)
Please advise. Thanks.  

## 2017-06-28 NOTE — Progress Notes (Signed)
Info given.  She is good on her drug screen. She went to Pain Management and they re-tested her urine today.

## 2017-06-29 LAB — FUNGAL ORGANISM REFLEX

## 2017-06-29 LAB — FUNGUS CULTURE WITH STAIN

## 2017-06-29 LAB — FUNGUS CULTURE RESULT

## 2017-07-08 DIAGNOSIS — K760 Fatty (change of) liver, not elsewhere classified: Secondary | ICD-10-CM | POA: Diagnosis not present

## 2017-07-08 DIAGNOSIS — R748 Abnormal levels of other serum enzymes: Secondary | ICD-10-CM | POA: Diagnosis not present

## 2017-07-11 DIAGNOSIS — K760 Fatty (change of) liver, not elsewhere classified: Secondary | ICD-10-CM | POA: Diagnosis not present

## 2017-07-12 DIAGNOSIS — F319 Bipolar disorder, unspecified: Secondary | ICD-10-CM | POA: Diagnosis not present

## 2017-07-12 LAB — ACID FAST CULTURE WITH REFLEXED SENSITIVITIES (MYCOBACTERIA): Acid Fast Culture: NEGATIVE

## 2017-07-12 LAB — ACID FAST CULTURE WITH REFLEXED SENSITIVITIES

## 2017-07-19 ENCOUNTER — Telehealth: Payer: Self-pay | Admitting: Family

## 2017-07-19 NOTE — Telephone Encounter (Signed)
Please advise.  Do you want me to just schedule her on the nurse's schedule?

## 2017-07-19 NOTE — Telephone Encounter (Signed)
Spoke with patient and appointment made for this coming Friday at 10:15 am.

## 2017-07-19 NOTE — Telephone Encounter (Signed)
Okay to come get hep A vaccine on nurses schedule.

## 2017-07-19 NOTE — Telephone Encounter (Signed)
Copied from [place] [phone]. Topic: Quick Communication - See Telephone Encounter >> Jul 19, 2017 12:35 PM Vernona Rieger wrote: CRM for notification. See Telephone encounter for: 07/19/17.   Patient said she just received her results from her liver specialist and they recommended she get a hep A vaccine. Please advise. She said that [name] referred her there. She said she doesn't know the name of the doctor. The address of the doctor is [address] Call back is [phone]

## 2017-07-20 ENCOUNTER — Telehealth: Payer: Self-pay | Admitting: Nurse Practitioner

## 2017-07-20 ENCOUNTER — Other Ambulatory Visit: Payer: Self-pay | Admitting: Family

## 2017-07-20 DIAGNOSIS — E782 Mixed hyperlipidemia: Secondary | ICD-10-CM

## 2017-07-20 MED ORDER — ATORVASTATIN CALCIUM 40 MG PO TABS: ORAL_TABLET | ORAL | 0 refills | Status: DC

## 2017-07-20 NOTE — Telephone Encounter (Signed)
Please advise. Thanks.  

## 2017-07-20 NOTE — Telephone Encounter (Signed)
Called and left message for patient and CRM created incase she returns call to clinic.

## 2017-07-20 NOTE — Telephone Encounter (Signed)
Lets start back on the Lipitor; it looks like she has been taking 3x per week which is fine; plan to get her lipids, LFTs re-checked in about 6 weeks. Refill sent.

## 2017-07-20 NOTE — Telephone Encounter (Signed)
Copied from [place] [id number]. Topic: Quick Communication - See Telephone Encounter >> Jul 20, 2017 10:32 AM Rutherford Nail, NT wrote: CRM for notification. See Telephone encounter for: 07/20/17. Patient calling and states that [name] wanted to know if the liver specialist gave the okay for her to be on cholesterol medication. Patient states the liver specialists gave the ok, Wants to know if [name] wants her to start back on the atorvastatin or does she want to start her on something different? Please advise. CB#: [phone] can leave a message

## 2017-07-22 ENCOUNTER — Ambulatory Visit: Payer: PPO

## 2017-07-25 ENCOUNTER — Encounter (INDEPENDENT_AMBULATORY_CARE_PROVIDER_SITE_OTHER): Payer: Self-pay

## 2017-07-25 ENCOUNTER — Ambulatory Visit (INDEPENDENT_AMBULATORY_CARE_PROVIDER_SITE_OTHER)
Admission: RE | Admit: 2017-07-25 | Discharge: 2017-07-25 | Disposition: A | Discharge status: Home / Self Care | Payer: PPO | Source: Ambulatory Visit | Attending: Emergency Medicine | Admitting: Emergency Medicine

## 2017-07-25 ENCOUNTER — Telehealth: Payer: Self-pay | Admitting: Emergency Medicine

## 2017-07-25 DIAGNOSIS — R911 Solitary pulmonary nodule: Secondary | ICD-10-CM

## 2017-07-25 NOTE — Telephone Encounter (Signed)
CT done today and is back but need RB to review. I will route to him to review and call pt back.  ATC pt to let her know,  no answer. Left message for pt to call back.

## 2017-07-26 DIAGNOSIS — M7060 Trochanteric bursitis, unspecified hip: Secondary | ICD-10-CM | POA: Diagnosis not present

## 2017-07-26 DIAGNOSIS — Z5181 Encounter for therapeutic drug level monitoring: Secondary | ICD-10-CM | POA: Diagnosis not present

## 2017-07-26 DIAGNOSIS — M5136 Other intervertebral disc degeneration, lumbar region: Secondary | ICD-10-CM | POA: Diagnosis not present

## 2017-07-26 DIAGNOSIS — G894 Chronic pain syndrome: Secondary | ICD-10-CM | POA: Diagnosis not present

## 2017-07-26 NOTE — Telephone Encounter (Signed)
Attempted to call pt. I did not receive an answer. I have left a message for pt to return our call.  

## 2017-07-26 NOTE — Telephone Encounter (Signed)
Returned patient call and let her know that CT results are not back at this time, but someone will contact her once RB has read results.

## 2017-07-26 NOTE — Telephone Encounter (Signed)
These let her know that I have reviewed her CT scan.  The pulmonary nodules we have been following are all stable.  This is good news.  We need to repeat her CT scan of the chest in 1 year (June 2020).

## 2017-07-26 NOTE — Telephone Encounter (Signed)
Pt is returning call. Cb is [phone].

## 2017-07-26 NOTE — Telephone Encounter (Signed)
Attempted to call pt. Call went straight to voicemail. I have left a message for pt to return our call.  

## 2017-07-27 NOTE — Telephone Encounter (Addendum)
Spoke with pt. She is aware of results. Nothing further was needed.  

## 2017-08-03 ENCOUNTER — Telehealth: Payer: Self-pay | Admitting: Internal Medicine

## 2017-08-03 ENCOUNTER — Other Ambulatory Visit: Payer: Self-pay | Admitting: Internal Medicine

## 2017-08-03 NOTE — Telephone Encounter (Signed)
Pt reports that when she woke up this morning she was nauseated. She reports that she vomited and there was bright red blood in the vomitus. States this has stopped now but happened x2 this am. Discussed with pt that she needed to proceed to the ER to be evaluated. Pt verbalized understanding. Dr. [name] notified.

## 2017-08-03 NOTE — Telephone Encounter (Signed)
Agree, needs ER evaluation for either hemoptysis or hematemesis

## 2017-08-03 NOTE — Telephone Encounter (Signed)
Pt called to inform that she has been coughing blood since Monday. She feels nauseous.and stated that it has happened before. She needs some advise. Please call her

## 2017-08-03 NOTE — Telephone Encounter (Signed)
Mother had brain metastases - cancer Patient says her stress level is way up and always has nausea and vomiting when she is anxious Had has bright red blood in emesis since Monday (3 d ago) several times and says she awakens and has bright red blood emesis then it will gradually subside with that  No fever, melena or sig abd pain  She was told to go to ED and did so but there was a 6 hr wait and she was feelking better so went home and contacted me  Has an anti-emetic suppository and will use  I told her safest thing would be go to ED - and should go back, especially if more hematemesis  We will contact her in AM ito see if she is home (or at ED) -  I refilled promethazine suppositories to use prn  If still having hematemesis tomorrow suspect ED visit sennsible

## 2017-08-04 ENCOUNTER — Telehealth: Payer: Self-pay | Admitting: Emergency Medicine

## 2017-08-04 NOTE — Telephone Encounter (Signed)
[name] [name] was also triaged by Dr. Carlean Purl and is nurse on 6.12.19 for same reason. Advised by them to go to ER. Spoke with Rosanne Sack, RN to advise her that we are sending the signed note from MR over to them for FYI.   Routing to Rosanne Sack, RN and Dr. Carlean Purl. Please let us know if there is something else that we can do.

## 2017-08-04 NOTE — Telephone Encounter (Signed)
When I talked to her the bleeding did not seem severe and was like what she has had in past  Hard to tell for sure over phone  She either needs to see an APP today/tomorrow or go to ED - if still vomiting blood think ED makes most sense  We can refill her promethazine suppositories  Let me know if other ?

## 2017-08-04 NOTE — Telephone Encounter (Signed)
   I missed this page  Yesterday due to page coming towards end of shift. My apologies.  So I called patient [name] [name] having nausea few days. Having some blood with vomit.  Says vomiting blood but she insists it is coming for throast. She says she has quit drinking etoh. Wants something to control nausea and vomit  rec  - go to ER if gets worse - advised that we are pulmonary docs and do not normally Rx nausea -> she apologized saying she thought we were GI  So triage  - pleas send note to [facility]   Thanks  Dr. Brand Males, M.D., [facility].C.P Pulmonary and Critical Care Medicine Staff Physician, [place] Director - Interstitial Lung Disease  Program  P[facility] [place] [facility] [place], [place], [address]  Pager: [phone], If no answer or between  15:00h - 7:00h: call [phone] Telephone: [phone]

## 2017-08-04 NOTE — Telephone Encounter (Signed)
Spoke with pt and she states she is better, not having issue like yesterday. She is going to pick up script for phenergan and plans to go see her mother, she has brain tumor and hospice has been called in. Pt tearful over the phone. States if she gets worse she will go to hospital where her mother lives.

## 2017-08-04 NOTE — Telephone Encounter (Signed)
See additional phone note. 

## 2017-08-12 ENCOUNTER — Ambulatory Visit (INDEPENDENT_AMBULATORY_CARE_PROVIDER_SITE_OTHER)
Admission: RE | Admit: 2017-08-12 | Discharge: 2017-08-12 | Disposition: A | Discharge status: Home / Self Care | Payer: PPO | Source: Ambulatory Visit | Attending: Family | Admitting: Family

## 2017-08-12 ENCOUNTER — Other Ambulatory Visit (INDEPENDENT_AMBULATORY_CARE_PROVIDER_SITE_OTHER): Payer: PPO

## 2017-08-12 ENCOUNTER — Encounter: Payer: Self-pay | Admitting: Family

## 2017-08-12 ENCOUNTER — Ambulatory Visit (INDEPENDENT_AMBULATORY_CARE_PROVIDER_SITE_OTHER): Payer: PPO | Admitting: Family

## 2017-08-12 ENCOUNTER — Telehealth: Payer: Self-pay | Admitting: Internal Medicine

## 2017-08-12 VITALS — BP 130/70 | HR 88 | Temp 98.8°F | Ht 65.0 in | Wt 189.0 lb

## 2017-08-12 DIAGNOSIS — R05 Cough: Secondary | ICD-10-CM | POA: Diagnosis not present

## 2017-08-12 DIAGNOSIS — R042 Hemoptysis: Secondary | ICD-10-CM | POA: Diagnosis not present

## 2017-08-12 DIAGNOSIS — R079 Chest pain, unspecified: Secondary | ICD-10-CM

## 2017-08-12 LAB — COMPREHENSIVE METABOLIC PANEL
ALBUMIN: 4.1 g/dL (ref 3.5–5.2)
ALT: 50 U/L — ABNORMAL HIGH (ref 0–35)
AST: 155 U/L — AB (ref 0–37)
Alkaline Phosphatase: 147 U/L — ABNORMAL HIGH (ref 39–117)
BUN: 9 mg/dL (ref 6–23)
CHLORIDE: 99 meq/L (ref 96–112)
CO2: 28 mEq/L (ref 19–32)
CREATININE: 0.79 mg/dL (ref 0.40–1.20)
Calcium: 9.6 mg/dL (ref 8.4–10.5)
GFR: 79.86 mL/min (ref >60.00)
Glucose, Bld: 193 mg/dL — ABNORMAL HIGH (ref 70–99)
Potassium: 5.1 mEq/L (ref 3.5–5.1)
SODIUM: 136 meq/L (ref 135–145)
Total Bilirubin: 2 mg/dL — ABNORMAL HIGH (ref 0.2–1.2)
Total Protein: 8.2 g/dL (ref 6.0–8.3)

## 2017-08-12 LAB — CBC WITH DIFFERENTIAL/PLATELET
BASOS PCT: 0.7 % (ref 0.0–3.0)
Basophils Absolute: 0 10*3/uL (ref 0.0–0.1)
EOS ABS: 0.1 10*3/uL (ref 0.0–0.7)
Eosinophils Relative: 2.3 % (ref 0.0–5.0)
HEMATOCRIT: 39.8 % (ref 36.0–46.0)
HEMOGLOBIN: 13.8 g/dL (ref 12.0–15.0)
LYMPHS PCT: 29.9 % (ref 12.0–46.0)
Lymphs Abs: 1.4 10*3/uL (ref 0.7–4.0)
MCHC: 34.7 g/dL (ref 30.0–36.0)
MCV: 105.9 fl — ABNORMAL HIGH (ref 78.0–100.0)
Monocytes Absolute: 0.4 10*3/uL (ref 0.1–1.0)
Monocytes Relative: 9.4 % (ref 3.0–12.0)
NEUTROS ABS: 2.7 10*3/uL (ref 1.4–7.7)
Neutrophils Relative %: 57.7 % (ref 43.0–77.0)
Platelets: 172 10*3/uL (ref 150.0–400.0)
RBC: 3.76 Mil/uL — ABNORMAL LOW (ref 3.87–5.11)
RDW: 14 % (ref 11.5–15.5)
WBC: 4.7 10*3/uL (ref 4.0–10.5)

## 2017-08-12 NOTE — Progress Notes (Signed)
[name] [name] is a 57 y.o. female with the following history as recorded in Epic[employer]:  Patient Active Problem List   Diagnosis Date Noted  . Hemoptysis 05/26/2017  . Pulmonary nodule, right 11/29/2016  . Polyarthralgia 12/02/2015  . Varicose veins of both lower extremities 12/02/2015  . Hypertensive retinopathy of both eyes 11/24/2015  . Elevated liver enzymes 10/31/2015  . Hyperglycemia 10/31/2015  . SIRS (systemic inflammatory response syndrome) ([place]) 03/11/2013  . Hypertension   . Hyperlipidemia   . Anemia   . Hematochezia 08/14/2012  . ETOH abuse 08/14/2012  . Pseudocyst of pancreas 08/12/2012  . Acute pancreatitis 08/12/2012  . GERD (gastroesophageal reflux disease) 06/08/2012  . Rectal fissure 05/11/2011  . Rectal fistula 05/11/2011  . Hiatal hernia 04/28/2011  . Gastritis 04/28/2011  . Nausea 04/27/2011  . Acute epigastric pain 04/27/2011  . Obesity, Class III, BMI 40-49.9 (morbid obesity) ([place]) 04/27/2011  . Solitary rectal ulcer 04/12/2011  . Hemorrhoids 03/10/2011  . Ulcerative proctitis, nonspecific ([place]) 03/10/2011  . DIVERTICULOSIS, COLON 10/22/2009  . COLONIC POLYPS, HYPERPLASTIC, HX OF 10/22/2009  . INFECTIOUS DIARRHEA 09/27/2008  . ULCERATIVE PROCTITIS 09/27/2008  . NAUSEA AND VOMITING 09/27/2008  . NAUSEA ALONE 09/27/2008  . DIARRHEA 09/27/2008  . ABDOMINAL PAIN -GENERALIZED 09/27/2008  . DEPRESSION 09/12/2007  . HEMORRHOIDS 09/12/2007  . HIATAL HERNIA 09/12/2007  . ULCERATIVE COLITIS 09/12/2007  . RECTAL PAIN 09/12/2007  . BIPOLAR AFFECTIVE DISORDER 12/06/2006  . TOBACCO ABUSE 12/06/2006  . Esophageal reflux 12/06/2006  . Cough 12/06/2006    Current Outpatient Medications  Medication Sig Dispense Refill  . albuterol (PROAIR HFA) 108 (90 Base) MCG/ACT inhaler Inhale 2 puffs into the lungs every 4 (four) hours as needed for wheezing or shortness of breath. 1 Inhaler 5  . ARIPiprazole (ABILIFY) 15 MG tablet Take 15 mg by mouth 2 (two) times daily.      . ARIPiprazole (ABILIFY) 30 MG tablet     . atorvastatin (LIPITOR) 40 MG tablet take 1 tablet by mouth at bedtime ON MONDAY, WEDNESDAY AND FRIDAY 90 tablet 0  . dexlansoprazole (DEXILANT) 60 MG capsule Take 1 capsule (60 mg total) by mouth daily. 30 capsule 6  . diclofenac sodium (VOLTAREN) 1 % GEL Apply 2 g topically 4 (four) times daily.     Marland Kitchen FLUoxetine (PROZAC) 20 MG capsule Take 20 mg by mouth 2 (two) times daily.     . fluticasone (FLONASE) 50 MCG/ACT nasal spray instill 2 sprays into each nostril once daily (Patient taking differently: Place 2 sprays into both nostrils. ) 16 g 5  . HYDROcodone-acetaminophen (NORCO/VICODIN) 5-325 MG tablet Take 1 tablet by mouth 4 (four) times daily as needed for moderate pain.     Marland Kitchen lamoTRIgine (LAMICTAL) 100 MG tablet Take 100 mg by mouth 2 (two) times daily.     . methocarbamol (ROBAXIN) 500 MG tablet Take 500 mg by mouth 2 (two) times daily as needed for muscle spasms.     . Multiple Vitamin (MULTIVITAMIN WITH MINERALS) TABS tablet Take 1 tablet by mouth daily. 30 tablet 0  . NARCAN 4 MG/0.1ML LIQD nasal spray kit Place 0.4 mg into the nose once.     . promethazine (PHENERGAN) 25 MG suppository PLACE 1 SUPPOSITORY RECTALLY EVERY 6 HOURS AS NEEDED FOR NAUSEA OR VOMITING 30 suppository 0  . Tetrahydrozoline HCl (VISINE OP) Place 1-2 drops into both eyes daily.    . varenicline (CHANTIX PAK) 0.5 MG X 11 & 1 MG X 42 tablet Take one 0.5 mg  tablet by mouth once daily for 3 days, then increase to one 0.5 mg tablet twice daily for 4 days, then increase to one 1 mg tablet twice daily. 53 tablet 0  . benzonatate (TESSALON) 200 MG capsule Take 1 capsule (200 mg total) by mouth every 6 (six) hours as needed for cough. (Patient not taking: Reported on 08/12/2017) 120 capsule 1   No current facility-administered medications for this visit.     Allergies: Patient has no known allergies.  Past Medical History:  Diagnosis Date  . Anal fistula   . Anemia   . Anxiety    . Arthritis   . Bipolar disorder ([place])   . Chronic kidney disease   . Depressive disorder, not elsewhere classified   . Diverticulosis of colon (without mention of hemorrhage)   . Esophageal reflux   . GERD (gastroesophageal reflux disease)   . Hepatic steatosis   . Hiatal hernia   . History of recurrent UTIs   . Hyperlipidemia   . Hypertension   . Iron deficiency   . Obesity   . Pancreatitis   . Pancreatitis   . Personal history of colonic polyps 10/16/2009   hyperplastic  . Pre-diabetes   . Seizures ([place])   . Ulcerative colitis, unspecified     Past Surgical History:  Procedure Laterality Date  . FINGER AMPUTATION  2010   right index  . HEMORROIDECTOMY    . KNEE ARTHROSCOPY     bilateral, left x2  . TOTAL ABDOMINAL HYSTERECTOMY    . VIDEO BRONCHOSCOPY Bilateral 05/31/2017   Procedure: VIDEO BRONCHOSCOPY WITHOUT FLUORO;  Surgeon: Collene Gobble, MD;  Location: [facility];  Service: Cardiopulmonary;  Laterality: Bilateral;    Family History  Problem Relation Age of Onset  . Irritable bowel syndrome Mother   . Hypertension Mother   . Atrial fibrillation Mother   . Ovarian cancer Maternal Aunt   . Diabetes Father   . Heart attack Father   . Hypertension Father   . Hyperlipidemia Father   . Thyroid disease Sister   . Hyperlipidemia Sister   . Thyroid disease Brother   . Colon polyps Brother   . Stomach cancer Neg Hx   . Colon cancer Neg Hx     Social History   Tobacco Use  . Smoking status: Current Every Day Smoker    Packs/day: 0.25    Types: Cigarettes  . Smokeless tobacco: Never Used  . Tobacco comment: pt states she smokes 3 cigarettes/ day  Substance Use Topics  . Alcohol use: Yes    Alcohol/week: 0.6 oz    Types: 1 Shots of liquor per week    Comment: occ    Subjective:  Patient presents with concerns about episode of pain under her right breast x 2 days ago; denies any symptoms of pain today; describes the pain "as a deep pain." Denies any chest  pain or shortness of breath on exertion; thinks pain is worse when lying down; not affected by food; does still have her gallbladder but has had abdominal ultrasound in the past 2 months; no changes in bowel movements; admits that stress level is very high- mother recently diagnosed with brain cancer;  Has also had some episodes of  Coughing up "bright red blood"; talked to her GI about these symptoms last week; was told to go to ER but notes that wait was too long- opted not to wait; has not followed back up with her GI about the symptoms; decided to come  here first and see "what we think." Does have history of alcoholism and cirrhosis; patient states her sister is a [employer] scientist and was very concerned about esophageal varices; she said her sister wanted her to ask about some "preventive" so she could safely come be with their mother who is actively dying;     Objective:  Vitals:   08/12/17 1007  BP: 130/70  Pulse: 88  Temp: 98.8 F (37.1 C)  TempSrc: Oral  SpO2: 95%  Weight: 189 lb 0.6 oz (85.7 kg)  Height: _0  (1.651 m)    General: Well developed, well nourished, in no acute distress  Skin : Warm and dry.  Head: Normocephalic and atraumatic  Eyes: Sclera and conjunctiva clear; pupils round and reactive to light; extraocular movements intact  Ears: External normal; canals clear; tympanic membranes normal  Oropharynx: Pink, supple. No suspicious lesions  Neck: Supple without thyromegaly, adenopathy  Lungs: Respirations unlabored; clear to auscultation bilaterally without wheeze, rales, rhonchi  CVS exam: normal rate and regular rhythm.  Abdomen: Soft; nontender; nondistended; normoactive bowel sounds; no masses or hepatosplenomegaly  Neurologic: Alert and oriented; speech intact; face symmetrical; moves all extremities well; CNII-XII intact without focal deficit  Assessment:  1. Hemoptysis   2. Chest pain, unspecified type     Plan:  Will update CXR and labs today; check EKG- no acute  changes seen; stressed that she needed to stop by her GI office when leaving here today and see how quickly she could seen/ evaluated with coughing up blood; with risk factors and concerns for possible esophageal varices complicated by her need to be out of town, will consider trial of Propanolol 10 mg as prophylactic if she is unable to see her GI quickly; follow-up to be determined.   No follow-ups on file.  Orders Placed This Encounter  Procedures  . DG Chest 2 View    Standing Status:   Future    Number of Occurrences:   1    Standing Expiration Date:   10/13/2018    Order Specific Question:   Reason for Exam (SYMPTOM  OR DIAGNOSIS REQUIRED)    Answer:   hemoptysis    Order Specific Question:   Is patient pregnant?    Answer:   No    Order Specific Question:   Preferred imaging location?    Answer:   [facility]    Order Specific Question:   Radiology Contrast Protocol - do NOT remove file path    Answer:   [email or web address]  . CBC w/Diff    Standing Status:   Future    Number of Occurrences:   1    Standing Expiration Date:   08/12/2018  . Comp Met (CMET)    Standing Status:   Future    Number of Occurrences:   1    Standing Expiration Date:   08/12/2018  . Iron, TIBC and Ferritin Panel    Standing Status:   Future    Number of Occurrences:   1    Standing Expiration Date:   08/13/2018  . EKG 12-Lead    Requested Prescriptions    No prescriptions requested or ordered in this encounter

## 2017-08-12 NOTE — Telephone Encounter (Signed)
Spoke to patient, she described more vomiting blood episodes. She saw her PCP today who did labs and CXR. Patient stated that she was told to follow up here with GI on Monday. Spoke to AE, since she just saw her PCP and they did not send her to ED, if she feels she is stable, then see her mother, if she has further episodes of vomiting blood needs to seek medical attention at nearest ED. Patient understands these instructions.   Looked at labs and Ms. Murray's recommendations to contact her liver specialist-patient sees [facility].  Instructed patient that I will put her in an appointment slot that was open on Monday with Dr. [name], patient needs to also contact Theba Drazek's office as instructed by PCP. If patient cannot make appointment on Monday, she will call our office.

## 2017-08-12 NOTE — Progress Notes (Signed)
Reviewed labs with patient; stressed absolute necessity of keeping follow up with her liver specialist; discussed her alcohol abuse and she notes that she is considering inpatient rehab after her mother passes away; will hold on starting Propanolol for now as her GI was calling with planned follow-up while I was reviewing labs with her; will follow-up with her early next week.

## 2017-08-13 LAB — IRON,TIBC AND FERRITIN PANEL
%SAT: 49 % (calc) — ABNORMAL HIGH (ref 16–45)
FERRITIN: 442 ng/mL — AB (ref 16–232)
Iron: 192 ug/dL — ABNORMAL HIGH (ref 45–160)
TIBC: 393 mcg/dL (calc) (ref 250–450)

## 2017-08-15 ENCOUNTER — Other Ambulatory Visit: Payer: Self-pay | Admitting: Family

## 2017-08-15 ENCOUNTER — Ambulatory Visit: Payer: PPO | Admitting: Internal Medicine

## 2017-08-15 ENCOUNTER — Telehealth: Payer: Self-pay | Admitting: Internal Medicine

## 2017-08-15 DIAGNOSIS — R7989 Other specified abnormal findings of blood chemistry: Secondary | ICD-10-CM

## 2017-08-15 DIAGNOSIS — R945 Abnormal results of liver function studies: Secondary | ICD-10-CM

## 2017-08-16 NOTE — Telephone Encounter (Signed)
No charge. 

## 2017-08-17 DIAGNOSIS — M5136 Other intervertebral disc degeneration, lumbar region: Secondary | ICD-10-CM | POA: Diagnosis not present

## 2017-08-17 DIAGNOSIS — M7060 Trochanteric bursitis, unspecified hip: Secondary | ICD-10-CM | POA: Diagnosis not present

## 2017-08-17 DIAGNOSIS — G894 Chronic pain syndrome: Secondary | ICD-10-CM | POA: Diagnosis not present

## 2017-08-17 DIAGNOSIS — Z5181 Encounter for therapeutic drug level monitoring: Secondary | ICD-10-CM | POA: Diagnosis not present

## 2017-08-19 ENCOUNTER — Telehealth: Payer: Self-pay | Admitting: Internal Medicine

## 2017-08-19 MED ORDER — PROMETHAZINE HCL 25 MG RE SUPP: RECTAL | 0 refills | Status: DC

## 2017-08-19 NOTE — Telephone Encounter (Signed)
Phenergan sent to [facility] in [facility]

## 2017-08-26 ENCOUNTER — Encounter: Payer: Self-pay | Admitting: Hematology

## 2017-08-26 ENCOUNTER — Telehealth: Payer: Self-pay | Admitting: Hematology

## 2017-08-26 NOTE — Telephone Encounter (Signed)
New hematology referral received from [place] at [facility]. Pt has been scheduled to see Dr. Irene Limbo on 7/24 at 10am. Pt aware to arrive 30 minutes early. Letter mailed.

## 2017-09-02 ENCOUNTER — Telehealth: Payer: Self-pay | Admitting: Emergency Medicine

## 2017-09-02 ENCOUNTER — Other Ambulatory Visit: Payer: Self-pay | Admitting: Emergency Medicine

## 2017-09-02 NOTE — Telephone Encounter (Signed)
Called and spoke with pt regarding not feeling well. Pt reports postnasal drip, productive cough-white/clear, and snuffy nose. Scheduled appt with B. Mack on Monday 09/05/17 at 11am Nothing further needed

## 2017-09-04 NOTE — Progress Notes (Signed)
$'@Patient'P$  ID: [name] [name], female    DOB: 12/21/1960, 57 y.o.   MRN: [id number]  Chief Complaint  Patient presents with  . Acute Visit    Reports after cutting the grass over the weekend she started wheezing, increased SOB, naal congestion, dru cough. Denies chest pain or tightness. States her head feels stuffed up.     Referring provider: Marrian [name],*  HPI: 56 year old smoker (20+ pack years) followed for chronic cough.  Cough usually nonproductive, occasionally thick white.  Occasionally short of breath with exertion Past medical history: Obesity, GERD (Dexilant), obstructive sleep apnea (sleep study 03/09/2014) Patient of Dr. Lamonte [name]  Recent [facility] Encounters:   ROV 02/28/17 --this is a follow-up visit.  Patient is 57, continues to smoke approximately 3-4 cig a day, without any documented obstruction by spirometry.  We have followed her for a history of severe chronic cough, and a right upper lobe lobulated pulmonary nodule.  We repeated her CT scan of the chest 02/17/17 and I have reviewed.  This shows that the nodule is stable in size without any significant change in shape.  She returns today with continued cough. She is on flonase, Dexilant, loratadine. She is out of hycodan. She is to see Allergy in February.            ROV 05/26/17 --follow-up visit for patient with a history of tobacco use and obstructive lung disease.  She also has severe chronic cough and pulmonary nodule that we have been following with serial CT scans of the chest, stable 02/17/2017.  She is not currently on scheduled bronchodilators.  She uses albuterol approximately.  She has a hx of frequent nausea and emesis, GERD. Has been followed by Dr [name] [name]. She is on Flonase, Dexilant, Norco.  She tells me that she had an episode about 2 weeks ago when she saw blood after an episode of coughing and then some concomitant emesis. It was bright red, self limited. She had a reassuring EGD in January, has  never had FOB. She is smoking a bit more, is about top go back on Chantix.                ROV 06/23/17 --57 year old woman who follows up today for her history of COPD, continued tobacco use, severe chronic cough that is been debilitating.  She has frequent GERD and nausea that are contributors, allergic rhinitis it is a contributor.  She has a right upper lobe pulmonary nodule that we followed with serial CT scans, stable on 02/17/2017.  We performed bronchoscopy 05/31/2017 because she had continued cough and also some episodic hemoptysis.  Her airway evaluation was normal.  AFB, fungal, cell count, cytology all normal.  She continues to have persistent cough. She is using her nasal steroid and nasal washes. She ran out of benadryl, has used some loratadine. She is awakened at night with cough. Had her dexilant increased to '60mg'$ . She doesn';t feel any burning.  She no longer has her norco/ vicodin until next week. She is only smoking a few cigarettes a week.  Plan: CT chest in June/2019, Hycodan as needed for cough, continue follow-up with pain management, continue to work on decreasing her smoking, follow-up in 3 months or sooner if needed, continue Benadryl or loratadine once a day  Tests:   05/31/2017-bronchoscopy   11/17/2016-pulmonary function test- normal spirometry 03/04/2014-sleep study- AHI 0.3  Imaging:  08/12/2017-chest x-ray- stable appearance of right apical lung nodule, follow-up CT recommended in 1 year 07/25/2017-CT  chest without contrast- pulmonary nodule within right upper lobe is unchanged from comparison exam, stable findings since 11/10/2016. 02/17/2017-CT super D chest without contrast- again noted is a pulmonary nodule within the periphery of the right upper lobe measuring 1.9 x 1.4 cm.  Not significantly changed since previous exam, scattered tiny nodules are identified in both lungs that have nonspecific appearance measuring 2.3 mm 11/18/2016-PET scan- 2.1 x 1.4 cm nodule in right upper  lobe demonstrates low level uptake, this is equivocal to low-grade neoplasm/malignancy is not excluded, level uptake can also be seen in benign etiologies, intense uptake in the anus without CT correlation- recommend correlation with physical exam and underlying anal carcinoma is not excluded  Cardiac:   Labs:   Micro:   Chart Review:   12/09/2016- colonoscopy- 4 polyps were found in the descending colon and transverse colon >>>Repeat colonoscopy in 3 years for surveillance       09/05/17 Acute 57 year old patient seen in office today.  With continued worsening symptoms of nasal congestion, itchy watery eyes, sore throat, wheezing.  Patient reports that the symptoms started after she cut her grass for 3 hours on Thursday, 3 hours on Friday, 3 hours on Saturday.  Patient reports the symptoms continue to worsen.  Patient does not wear a mask.  Patient with known allergic rhinitis.  Patient is not regularly adherent to generic loratadine.  Patient is managed in our office for chronic cough and she is still a current every day smoker.  Patient reports that she has been smoking about 3 to 4 cigars a day.  Patient with grief and sadness today in office.  Patient reports she lost her mom 2 weeks ago.  Patient reports she lost her brother 2 years ago.  Patient reports that she is also lost some extended family members over the past few months.  No Known Allergies   There is no immunization history on file for this patient.  Past Medical History:  Diagnosis Date  . Anal fistula   . Anemia   . Anxiety   . Arthritis   . Bipolar disorder ([place])   . Chronic kidney disease   . Depressive disorder, not elsewhere classified   . Diverticulosis of colon (without mention of hemorrhage)   . Esophageal reflux   . GERD (gastroesophageal reflux disease)   . Hepatic steatosis   . Hiatal hernia   . History of recurrent UTIs   . Hyperlipidemia   . Hypertension   . Iron deficiency   . Obesity   .  Pancreatitis   . Pancreatitis   . Personal history of colonic polyps 10/16/2009   hyperplastic  . Pre-diabetes   . Seizures ([place])   . Ulcerative colitis, unspecified     Tobacco History: Social History   Tobacco Use  Smoking Status Current Every Day Smoker  . Packs/day: 0.25  . Types: Cigarettes  Smokeless Tobacco Never Used  Tobacco Comment   pt states she smokes 3 cigarettes/ day   Ready to quit: Yes Counseling given: Not Answered Comment: pt states she smokes 3 cigarettes/ day 4-5x a day cigars   Emphasized the importance of the patient that we need to stop smoking.  As this works against her ability to be able to care for her respiratory status.  Patient agrees.  Patient knows that she needs to be more aggressive with stopping smoking.  She was doing better prior to losing her mom.  Patient knows she needs to try again.  Outpatient Encounter Medications  as of 09/05/2017  Medication Sig  . albuterol (PROAIR HFA) 108 (90 Base) MCG/ACT inhaler Inhale 2 puffs into the lungs every 4 (four) hours as needed for wheezing or shortness of breath.  . ARIPiprazole (ABILIFY) 30 MG tablet   . atorvastatin (LIPITOR) 40 MG tablet take 1 tablet by mouth at bedtime ON MONDAY, [place]  . benzonatate (TESSALON) 200 MG capsule Take 1 capsule (200 mg total) by mouth every 6 (six) hours as needed for cough.  . dexlansoprazole (DEXILANT) 60 MG capsule Take 1 capsule (60 mg total) by mouth daily.  . diclofenac sodium (VOLTAREN) 1 % GEL Apply 2 g topically 4 (four) times daily.   Marland Kitchen FLUoxetine (PROZAC) 20 MG capsule Take 20 mg by mouth 2 (two) times daily.   . fluticasone (FLONASE) 50 MCG/ACT nasal spray instill 2 sprays into each nostril once daily (Patient taking differently: Place 2 sprays into both nostrils. )  . HYDROcodone-acetaminophen (NORCO/VICODIN) 5-325 MG tablet Take 1 tablet by mouth 4 (four) times daily as needed for moderate pain.   Marland Kitchen lamoTRIgine (LAMICTAL) 100 MG tablet Take  100 mg by mouth 2 (two) times daily.   . methocarbamol (ROBAXIN) 500 MG tablet Take 500 mg by mouth 2 (two) times daily as needed for muscle spasms.   . Multiple Vitamin (MULTIVITAMIN WITH MINERALS) TABS tablet Take 1 tablet by mouth daily.  Marland Kitchen NARCAN 4 MG/0.1ML LIQD nasal spray kit Place 0.4 mg into the nose once.   . promethazine (PHENERGAN) 25 MG suppository PLACE 1 SUPPOSITORY RECTALLY EVERY 6 HOURS AS NEEDED FOR NAUSEA OR VOMITING  . Tetrahydrozoline HCl (VISINE OP) Place 1-2 drops into both eyes daily.  . varenicline (CHANTIX PAK) 0.5 MG X 11 & 1 MG X 42 tablet Take one 0.5 mg tablet by mouth once daily for 3 days, then increase to one 0.5 mg tablet twice daily for 4 days, then increase to one 1 mg tablet twice daily.  . [DISCONTINUED] ARIPiprazole (ABILIFY) 15 MG tablet Take 15 mg by mouth 2 (two) times daily.   . predniSONE (DELTASONE) 10 MG tablet Take 1 tablet (10 mg total) by mouth daily with breakfast.   No facility-administered encounter medications on file as of 09/05/2017.      Review of Systems  Review of Systems  Constitutional: Positive for fatigue. Negative for chills, fever and unexpected weight change.  HENT: Positive for congestion, postnasal drip, sinus pressure and sore throat. Negative for ear pain, rhinorrhea, sinus pain and sneezing.   Respiratory: Positive for cough. Negative for chest tightness, shortness of breath and wheezing.   Cardiovascular: Negative for chest pain and palpitations.  Gastrointestinal: Negative for blood in stool, diarrhea, nausea and vomiting.  Endocrine: Negative for polydipsia, polyphagia and polyuria.  Genitourinary: Negative for dysuria, frequency and urgency.  Musculoskeletal: Negative for arthralgias.  Skin: Negative for color change.  Allergic/Immunologic: Negative for environmental allergies, food allergies and immunocompromised state.  Neurological: Negative for dizziness, tremors, light-headedness and headaches.    Psychiatric/Behavioral: Positive for dysphoric mood. The patient is nervous/anxious.       Physical Exam  BP 118/78   Pulse 71   Ht '5\' 5"'$  (1.651 m)   Wt 190 lb 6.4 oz (86.4 kg)   SpO2 98%   BMI 31.68 kg/m   Wt Readings from Last 5 Encounters:  09/05/17 190 lb 6.4 oz (86.4 kg)  08/12/17 189 lb 0.6 oz (85.7 kg)  06/24/17 189 lb 1.9 oz (85.8 kg)  06/23/17 189 lb 12.8 oz (  86.1 kg)  05/31/17 199 lb (90.3 kg)     Physical Exam  Constitutional: She is oriented to person, place, and time and well-developed, well-nourished, and in no distress. No distress.  HENT:  Head: Normocephalic and atraumatic.  Right Ear: Hearing, external ear and ear canal normal.  Left Ear: Hearing, external ear and ear canal normal.  Nose: Rhinorrhea present. Right sinus exhibits no maxillary sinus tenderness and no frontal sinus tenderness. Left sinus exhibits no maxillary sinus tenderness and no frontal sinus tenderness.  Mouth/Throat: Uvula is midline and oropharynx is clear and moist. No oropharyngeal exudate.  Bilateral effusions, without infection, probable eustachian tube dysfunction  Eyes: Pupils are equal, round, and reactive to light.  Neck: Normal range of motion. Neck supple. No JVD present.  Cardiovascular: Normal rate, regular rhythm and normal heart sounds.  Pulmonary/Chest: Effort normal and breath sounds normal. No accessory muscle usage. No respiratory distress. She has no decreased breath sounds. She has no wheezes. She has no rhonchi.  Abdominal: Soft. Bowel sounds are normal. There is no tenderness.  Musculoskeletal: Normal range of motion. She exhibits no edema.  Lymphadenopathy:    She has no cervical adenopathy.  Neurological: She is alert and oriented to person, place, and time. Gait normal.  Skin: Skin is warm and dry. She is not diaphoretic. No erythema.  Psychiatric: Mood, memory, affect and judgment normal.  Nursing note and vitals reviewed.     Lab Results:  CBC     Component Value Date/Time   WBC 4.7 08/12/2017 1109   RBC 3.76 (L) 08/12/2017 1109   HGB 13.8 08/12/2017 1109   HCT 39.8 08/12/2017 1109   PLT 172.0 08/12/2017 1109   MCV 105.9 (H) 08/12/2017 1109   MCH 33.3 03/13/2013 0510   MCHC 34.7 08/12/2017 1109   RDW 14.0 08/12/2017 1109   LYMPHSABS 1.4 08/12/2017 1109   MONOABS 0.4 08/12/2017 1109   EOSABS 0.1 08/12/2017 1109   BASOSABS 0.0 08/12/2017 1109    BMET    Component Value Date/Time   NA 136 08/12/2017 1109   K 5.1 08/12/2017 1109   CL 99 08/12/2017 1109   CO2 28 08/12/2017 1109   GLUCOSE 193 (H) 08/12/2017 1109   BUN 9 08/12/2017 1109   CREATININE 0.79 08/12/2017 1109   CALCIUM 9.6 08/12/2017 1109   GFRNONAA >90 03/15/2013 0502   GFRAA >90 03/15/2013 0502    BNP No results found for: BNP  ProBNP No results found for: PROBNP  Imaging: Dg Chest 2 View  Result Date: 08/12/2017 CLINICAL DATA:  Cough, congestion, sob x 1 wk, hemoptysis x 3-4 times, smoker, hx of HTN. EXAM: CHEST - 2 VIEW COMPARISON:  CT of the chest on 07/25/2017 and earlier FINDINGS: Heart size is normal. The lungs are free of focal consolidations and pleural effusions. No pulmonary edema. RIGHT apical lung nodule is again identified, measuring 1.8 centimeters x 1.4 centimeters on recent CT exam. IMPRESSION: Stable appearance of RIGHT apical lung nodule. Follow-up CT exam has been recommended in 1 year. No evidence for acute  abnormality. Electronically Signed   By: Nolon Nations M.D.   On: 08/12/2017 11:23     Assessment & Plan:   Pleasant 57 year old patient seen in office today.  Patient with allergic rhinitis flare.  Patient to start wearing a mask when mowing at home.  Patient to continue with daily loratadine.  Continue Flonase.  Will do short prednisone dose to help get flare under control.  Patient to follow-up with Dr.  Byrum in 6 to 8 weeks based off of May/2019 appointment recommendations.  Patient to monitor depression anxiety.  Patient  to follow-up with primary care regarding the symptoms.  Patient knows that this is a natural part of grief but if symptoms get out of control or she feels like she is unable to get out of a dark place that she needs to contact primary care for management.  Patient agrees.  TOBACCO ABUSE   Stop smoking: 1 800 QUIT NOW  >>> Patient to call this resource and utilize it to help support her quit smoking >>> Keep up your hard work with stopping smoking  You can also contact the [facility] >>>For smoking cessation classes call [phone]  We do not recommend using e-cigarettes as a form of stopping smoking   Follow-up with Dr. Lamonte [name] in 6 to 8 weeks  Allergic rhinitis AR flare  Prednisone >>> Take 2 tablets (20 mg) daily in the morning with food for the next 5 days  Flonase >>> 1 spray each nostril twice a day  Nasal saline rinses as needed  Wear a mask when mowing the grass  Follow-up with Dr. Lamonte [name] in 6 to 8 weeks     Lauraine Rinne, NP 09/05/2017

## 2017-09-05 ENCOUNTER — Ambulatory Visit: Payer: PPO | Admitting: Pulmonary Disease

## 2017-09-05 ENCOUNTER — Encounter: Payer: Self-pay | Admitting: Pulmonary Disease

## 2017-09-05 VITALS — BP 118/78 | HR 71 | Ht 65.0 in | Wt 190.4 lb

## 2017-09-05 DIAGNOSIS — J309 Allergic rhinitis, unspecified: Secondary | ICD-10-CM

## 2017-09-05 DIAGNOSIS — J301 Allergic rhinitis due to pollen: Secondary | ICD-10-CM

## 2017-09-05 DIAGNOSIS — F17200 Nicotine dependence, unspecified, uncomplicated: Secondary | ICD-10-CM

## 2017-09-05 HISTORY — DX: Allergic rhinitis, unspecified: J30.9

## 2017-09-05 MED ORDER — PREDNISONE 10 MG PO TABS: 10.0000 mg | ORAL_TABLET | Freq: Every day | ORAL | 0 refills | Status: DC

## 2017-09-05 NOTE — Patient Instructions (Signed)
Prednisone >>> Take 2 tablets (20 mg) daily in the morning with food for the next 5 days  Flonase >>> 1 spray each nostril twice a day  Nasal saline rinses as needed  Wear a mask when mowing the grass  Stop smoking: 1 800 QUIT NOW  >>> Patient to call this resource and utilize it to help support her quit smoking >>> Keep up your hard work with stopping smoking  You can also contact the [facility] >>>For smoking cessation classes call [phone]  We do not recommend using e-cigarettes as a form of stopping smoking   Follow-up with Dr. [name] in 6 to 8 weeks  Please contact the office if your symptoms worsen or you have concerns that you are not improving.   Thank you for choosing  [facility] for your healthcare, and for allowing [place] to partner with you on your healthcare journey. I am thankful to be able to provide care to you today.   Wyn Quaker FNP-C

## 2017-09-05 NOTE — Assessment & Plan Note (Signed)
   Stop smoking: 1 800 QUIT NOW  >>> Patient to call this resource and utilize it to help support her quit smoking >>> Keep up your hard work with stopping smoking  You can also contact the [facility] >>>For smoking cessation classes call [phone]  We do not recommend using e-cigarettes as a form of stopping smoking   Follow-up with Dr. [name] in 6 to 8 weeks

## 2017-09-05 NOTE — Assessment & Plan Note (Signed)
AR flare  Prednisone >>> Take 2 tablets (20 mg) daily in the morning with food for the next 5 days  Flonase >>> 1 spray each nostril twice a day  Nasal saline rinses as needed  Wear a mask when mowing the grass  Follow-up with Dr. [name] in 6 to 8 weeks

## 2017-09-06 NOTE — Progress Notes (Signed)
HEMATOLOGY/ONCOLOGY CONSULTATION NOTE  Date of Service: 09/14/2017  Patient Care Team: [name], FNP as PCP - General (Internal Medicine)  Dr. Scarlette Shorts as GI Dr. Roosevelt Locks as hepatology  CHIEF COMPLAINTS/PURPOSE OF CONSULTATION:  Elevated ferritin   HISTORY OF PRESENTING ILLNESS:   [name] [name] is a wonderful 57 y.o. female who has been referred to [place] by [name], NP for evaluation and management of Elevated ferritin. The pt reports that she is doing well overall.   The pt reports that she has followed Dr. Roosevelt Locks for her liver concerns. She notes that she has been drinking 234m of gin each day and previously drank as much as a pint of gin each day. She notes that she stopped drinking all together between 2001 and 2009 while attending AA and is planning to return to this soon. She attended inpatient and outpatient rehabilitation in the past. She adds that she returned to drinking recently with several deaths in her family. She follows DRoosevelt Locks NP.  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   The pt notes that she gets a little bit of tremors when she stops drinking alcohol. She mostly endorses an emotional dependence on alcohol and recognizes that physical dependence is also at play.   She notes that she last donated blood 3 years ago. She adds that she did not have breakfast that day, and was not well hydrated, and blacked out.   She notes that her weight has fluctuated some recently within a ten pound range but is stable overall.   Of note prior to the patient's visit today, pt has had [place] completed on 06/16/17 with results revealing Coarse echotexture liver diffusely suggesting fatty infiltration or cirrhosis. No focal liver lesion. No ascites. Negative for gallstones.   Most recent lab results (08/12/17) of CBC w/diff is as follows: all values are WNL except for RBC at 3.76, MCV at 105.9. Iron/TIBC/Ferritin 08/12/17 revealed  all values WNL except for Iron at 192, 49% Saturation, Ferritin at 442.   On review of systems, pt reports stable weight, some physical withdrawal symptoms, consuming 200cc of gin each day, and denies abdominal pains, leg swelling, and any other symptoms.   On Social Hx the pt reports smoking cigarettes and is currently using Chantix, marijuana use in the 1980s, heavy alcohol use of 203meach day, and denies current marijuana use, and other drug use.  On Family Hx the pt reports paternal grandparents deaths in 7031srom heart issues and both parent deaths in late 6077s   MEDICAL HISTORY:  Past Medical History:  Diagnosis Date  . Anal fistula   . Anemia   . Anxiety   . Arthritis   . Bipolar disorder (HCCairo  . Chronic kidney disease   . Depressive disorder, not elsewhere classified   . Diverticulosis of colon (without mention of hemorrhage)   . Esophageal reflux   . GERD (gastroesophageal reflux disease)   . Hepatic steatosis   . Hiatal hernia   . History of recurrent UTIs   . Hyperlipidemia   . Hypertension   . Iron deficiency   . Obesity   . Pancreatitis   . Pancreatitis   . Personal history of colonic polyps 10/16/2009   hyperplastic  . Pre-diabetes   . Seizures (HCGirard  . Ulcerative colitis, unspecified     SURGICAL HISTORY: Past Surgical History:  Procedure Laterality Date  . FINGER AMPUTATION  2010   right index  . HEMORROIDECTOMY    . KNEE ARTHROSCOPY     bilateral, left x2  . TOTAL ABDOMINAL HYSTERECTOMY    . VIDEO BRONCHOSCOPY Bilateral 05/31/2017   Procedure: VIDEO BRONCHOSCOPY WITHOUT FLUORO;  Surgeon: Collene Gobble, MD;  Location: [facility];  Service: Cardiopulmonary;  Laterality: Bilateral;    SOCIAL HISTORY: Social History   Socioeconomic History  . Marital status: Single    Spouse name: Not on file  . Number of children: 0  . Years of education: 2  . Highest education level: Not on file  Occupational History  . Occupation: produce Location manager: UNEMPLOYED  Social Needs  . Financial resource strain: Not on file  . Food insecurity:    Worry: Not on file    Inability: Not on file  . Transportation needs:    Medical: Not on file    Non-medical: Not on file  Tobacco Use  . Smoking status: Current Every Day Smoker    Packs/day: 0.25    Types: Cigars, Cigarettes  . Smokeless tobacco: Never Used  . Tobacco comment: pt states she smokes 3 cigarettes/ day. Trying to quit. Taking Chantix  Substance and Sexual Activity  . Alcohol use: Yes    Alcohol/week: 0.6 oz    Types: 1 Shots of liquor per week    Comment: occ  . Drug use: No  . Sexual activity: Not Currently  Lifestyle  . Physical activity:    Days per week: Not on file    Minutes per session: Not on file  . Stress: Not on file  Relationships  . Social connections:    Talks on phone: Not on file    Gets together: Not on file    Attends religious service: Not on file    Active member of club or organization: Not on file    Attends meetings of clubs or organizations: Not on file    Relationship status: Not on file  . Intimate partner violence:    Fear of current or ex partner: Not on file    Emotionally abused: Not on file    Physically abused: Not on file    Forced sexual activity: Not on file  Other Topics Concern  . Not on file  Social History Narrative  . Not on file    FAMILY HISTORY: Family History  Problem Relation Age of Onset  . Irritable bowel syndrome Mother   . Hypertension Mother   . Atrial fibrillation Mother   . Ovarian cancer Maternal Aunt   . Diabetes Father   . Heart attack Father   . Hypertension Father   . Hyperlipidemia Father   . Thyroid disease Sister   . Hyperlipidemia Sister   . Thyroid disease Brother   . Colon polyps Brother   . Stomach cancer Neg Hx   . Colon cancer Neg Hx     ALLERGIES:  has No Known Allergies.  MEDICATIONS:  Current Outpatient Medications  Medication Sig Dispense Refill  . albuterol (PROAIR  HFA) 108 (90 Base) MCG/ACT inhaler Inhale 2 puffs into the lungs every 4 (four) hours as needed for wheezing or shortness of breath. 1 Inhaler 5  . ARIPiprazole (ABILIFY) 30 MG tablet     . atorvastatin (LIPITOR) 40 MG tablet take 1 tablet by mouth at bedtime ON MONDAY, WEDNESDAY AND FRIDAY 90 tablet 0  . benzonatate (TESSALON) 200 MG capsule Take 1 capsule (200 mg total) by mouth every 6 (six)  hours as needed for cough. (Patient not taking: Reported on 09/14/2017) 120 capsule 1  . dexlansoprazole (DEXILANT) 60 MG capsule Take 1 capsule (60 mg total) by mouth daily. 30 capsule 6  . diclofenac sodium (VOLTAREN) 1 % GEL Apply 2 g topically 4 (four) times daily.     Marland Kitchen FLUoxetine (PROZAC) 20 MG capsule Take 20 mg by mouth 2 (two) times daily.     . fluticasone (FLONASE) 50 MCG/ACT nasal spray instill 2 sprays into each nostril once daily (Patient taking differently: Place 2 sprays into both nostrils. ) 16 g 5  . HYDROcodone-acetaminophen (NORCO/VICODIN) 5-325 MG tablet Take 1 tablet by mouth 4 (four) times daily as needed for moderate pain.     Marland Kitchen lamoTRIgine (LAMICTAL) 100 MG tablet Take 100 mg by mouth 2 (two) times daily.     . methocarbamol (ROBAXIN) 500 MG tablet Take 500 mg by mouth 2 (two) times daily as needed for muscle spasms.     . Multiple Vitamin (MULTIVITAMIN WITH MINERALS) TABS tablet Take 1 tablet by mouth daily. 30 tablet 0  . NARCAN 4 MG/0.1ML LIQD nasal spray kit Place 0.4 mg into the nose once.     . predniSONE (DELTASONE) 10 MG tablet Take 1 tablet (10 mg total) by mouth daily with breakfast. 20 tablet 0  . promethazine (PHENERGAN) 25 MG suppository PLACE 1 SUPPOSITORY RECTALLY EVERY 6 HOURS AS NEEDED FOR NAUSEA OR VOMITING 30 suppository 0  . Tetrahydrozoline HCl (VISINE OP) Place 1-2 drops into both eyes daily.    . varenicline (CHANTIX PAK) 0.5 MG X 11 & 1 MG X 42 tablet Take one 0.5 mg tablet by mouth once daily for 3 days, then increase to one 0.5 mg tablet twice daily for 4 days,  then increase to one 1 mg tablet twice daily. 53 tablet 0   No current facility-administered medications for this visit.     REVIEW OF SYSTEMS:    10 Point review of Systems was done is negative except as noted above.  PHYSICAL EXAMINATION:  . Vitals:   09/14/17 1017  BP: 129/86  Pulse: 77  Resp: 18  Temp: 98.2 F (36.8 C)  SpO2: 100%   Filed Weights   09/14/17 1017  Weight: 188 lb 8 oz (85.5 kg)   .Body mass index is 31.37 kg/m.  GENERAL:alert, in no acute distress and comfortable SKIN: no acute rashes, no significant lesions EYES: conjunctiva are pink and non-injected, sclera anicteric OROPHARYNX: MMM, no exudates, no oropharyngeal erythema or ulceration NECK: supple, no JVD LYMPH:  no palpable lymphadenopathy in the cervical, axillary or inguinal regions LUNGS: clear to auscultation b/l with normal respiratory effort HEART: regular rate & rhythm ABDOMEN:  normoactive bowel sounds , non tender, not distended. Extremity: no pedal edema PSYCH: alert & oriented x 3 with fluent speech NEURO: no focal motor/sensory deficits  LABORATORY DATA:  I have reviewed the data as listed  . CBC Latest Ref Rng & Units 08/12/2017 06/24/2017 05/20/2017  WBC 4.0 - 10.5 K/uL 4.7 8.2 4.4  Hemoglobin 12.0 - 15.0 g/dL 13.8 13.7 12.8  Hematocrit 36.0 - 46.0 % 39.8 39.7 37.5  Platelets 150.0 - 400.0 K/uL 172.0 222.0 144.0(L)    . CMP Latest Ref Rng & Units 08/12/2017 06/24/2017 05/20/2017  Glucose 70 - 99 mg/dL 193(H) 160(H) 134(H)  BUN 6 - 23 mg/dL _0 Creatinine 0.40 - 1.20 mg/dL 0.79 0.64 0.66  Sodium 135 - 145 mEq/L 136 137 139  Potassium 3.5 -  5.1 mEq/L 5.1 3.4(L) 4.4  Chloride 96 - 112 mEq/L 99 98 102  CO2 19 - 32 mEq/L _0 Calcium 8.4 - 10.5 mg/dL 9.6 9.3 9.5  Total Protein 6.0 - 8.3 g/dL 8.2 7.7 7.4  Total Bilirubin 0.2 - 1.2 mg/dL 2.0(H) 2.2(H) 1.6(H)  Alkaline Phos 39 - 117 U/L 147(H) 145(H) 128(H)  AST 0 - 37 U/L 155(H) 130(H) 169(H)  ALT 0 - 35 U/L 50(H) 52(H)  58(H)   . Lab Results  Component Value Date   IRON 192 (H) 08/12/2017   TIBC 393 08/12/2017   IRONPCTSAT 49 (H) 08/12/2017   (Iron and TIBC)  Lab Results  Component Value Date   FERRITIN 442 (H) 08/12/2017    01/10/12:    RADIOGRAPHIC STUDIES: I have personally reviewed the radiological images as listed and agreed with the findings in the report. No results found.  ASSESSMENT & PLAN:  57 y.o. female with   1. 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 -Most likely that Chronic liver disease and excess alcohol consumption are related to her mildly elevated Ferritin. Also transaminitis can falsely elevated ferritin levels due to release from liver stores. -Discussed that her excessive alcohol use is dangerous to her health and counseled towards cessation and attending AA as this proved effective for her in the past -Discussed that dramatic injury to liver is not currently being sustained due to iron. -Would nevertheless like to prevent secondary injury to liver from ferritin -Goal of ferritin <100 -Advised against taking iron supplements and cooking in a cast iron skillet -Recommend medications that bother the liver be avoided -Discussed a plan for the pt to begin donating blood once every 4-8 weeks with the red cross or community blood bank. -Will recheck ferritin in 6 months    RTC with Dr Irene Limbo with labs in 6 months   All of the patients questions were answered with apparent satisfaction. The patient knows to call the clinic with any problems, questions or concerns.  The total time spent in the appt was 35 minutes and more than 50% was on counseling and direct patient cares.      Sullivan Lone MD MS AAHIVMS [facility] Hematology/Oncology Physician [facility]  (Office):       [phone] (Work cell):  [phone] (Fax):           [phone]  09/14/2017 11:26 AM  I, Baldwin Jamaica, am acting as a Education administrator for Dr Irene Limbo.   .I have reviewed the above documentation for accuracy and completeness, and I agree with the above. Brunetta Genera MD   ADDENDUM  Patient later called and noted that the red cross would not accept her blood. Setup for monthly labs and phlebotomies for ferritin >100  .Brunetta Genera MD

## 2017-09-08 DIAGNOSIS — H40033 Anatomical narrow angle, bilateral: Secondary | ICD-10-CM | POA: Diagnosis not present

## 2017-09-08 DIAGNOSIS — H35033 Hypertensive retinopathy, bilateral: Secondary | ICD-10-CM | POA: Diagnosis not present

## 2017-09-08 DIAGNOSIS — H2513 Age-related nuclear cataract, bilateral: Secondary | ICD-10-CM | POA: Diagnosis not present

## 2017-09-08 DIAGNOSIS — H353132 Nonexudative age-related macular degeneration, bilateral, intermediate dry stage: Secondary | ICD-10-CM | POA: Diagnosis not present

## 2017-09-08 DIAGNOSIS — H2511 Age-related nuclear cataract, right eye: Secondary | ICD-10-CM | POA: Diagnosis not present

## 2017-09-08 DIAGNOSIS — H25013 Cortical age-related cataract, bilateral: Secondary | ICD-10-CM | POA: Diagnosis not present

## 2017-09-14 ENCOUNTER — Encounter: Payer: Self-pay | Admitting: Hematology

## 2017-09-14 ENCOUNTER — Telehealth: Payer: Self-pay | Admitting: Hematology

## 2017-09-14 ENCOUNTER — Inpatient Hospital Stay: Payer: PPO | Attending: Hematology | Admitting: Hematology

## 2017-09-14 DIAGNOSIS — I129 Hypertensive chronic kidney disease with stage 1 through stage 4 chronic kidney disease, or unspecified chronic kidney disease: Secondary | ICD-10-CM | POA: Insufficient documentation

## 2017-09-14 DIAGNOSIS — E785 Hyperlipidemia, unspecified: Secondary | ICD-10-CM | POA: Diagnosis not present

## 2017-09-14 DIAGNOSIS — F319 Bipolar disorder, unspecified: Secondary | ICD-10-CM | POA: Diagnosis not present

## 2017-09-14 DIAGNOSIS — K219 Gastro-esophageal reflux disease without esophagitis: Secondary | ICD-10-CM | POA: Diagnosis not present

## 2017-09-14 DIAGNOSIS — Z87440 Personal history of urinary (tract) infections: Secondary | ICD-10-CM

## 2017-09-14 DIAGNOSIS — M1990 Unspecified osteoarthritis, unspecified site: Secondary | ICD-10-CM | POA: Insufficient documentation

## 2017-09-14 DIAGNOSIS — Z79899 Other long term (current) drug therapy: Secondary | ICD-10-CM | POA: Diagnosis not present

## 2017-09-14 DIAGNOSIS — F1290 Cannabis use, unspecified, uncomplicated: Secondary | ICD-10-CM | POA: Diagnosis not present

## 2017-09-14 DIAGNOSIS — N189 Chronic kidney disease, unspecified: Secondary | ICD-10-CM | POA: Insufficient documentation

## 2017-09-14 DIAGNOSIS — F1020 Alcohol dependence, uncomplicated: Secondary | ICD-10-CM | POA: Diagnosis not present

## 2017-09-14 DIAGNOSIS — F17210 Nicotine dependence, cigarettes, uncomplicated: Secondary | ICD-10-CM

## 2017-09-14 DIAGNOSIS — Z8041 Family history of malignant neoplasm of ovary: Secondary | ICD-10-CM | POA: Diagnosis not present

## 2017-09-14 DIAGNOSIS — R7303 Prediabetes: Secondary | ICD-10-CM

## 2017-09-14 NOTE — Telephone Encounter (Signed)
Gave patient avs and calendar of upcoming jan appts. °

## 2017-09-15 ENCOUNTER — Telehealth: Payer: Self-pay | Admitting: Emergency Medicine

## 2017-09-15 NOTE — Telephone Encounter (Signed)
Spoke with pt.  She c/o dry cough worse at night.  Coughs so hard she cant catch her breath.  She takes Norco through the pain clinic and this also helps her cough but she is out of this and does not have an appt until Monday, July 29.  Pt is requesting a cough syrup /med for the cough until she can be seen in the pain clinic.  Pt finished the prednisone that was given to her at ov on 09/05/17.  Please advise.

## 2017-09-15 NOTE — Telephone Encounter (Signed)
I can't give her anything w narcotics in it. I would be happy to give her tessalon if she would like. She can use delsym as well.

## 2017-09-15 NOTE — Telephone Encounter (Signed)
Spoke with pt and advised of Dr Agustina Caroli recommendations.  PT verbalized understanding.  She states she has some Tessalon and will give this a try.  Nothing further needed.

## 2017-09-16 ENCOUNTER — Encounter: Payer: Self-pay | Admitting: Emergency Medicine

## 2017-09-16 ENCOUNTER — Ambulatory Visit (INDEPENDENT_AMBULATORY_CARE_PROVIDER_SITE_OTHER): Payer: PPO | Admitting: Emergency Medicine

## 2017-09-16 DIAGNOSIS — R05 Cough: Secondary | ICD-10-CM | POA: Diagnosis not present

## 2017-09-16 DIAGNOSIS — R911 Solitary pulmonary nodule: Secondary | ICD-10-CM

## 2017-09-16 DIAGNOSIS — R059 Cough, unspecified: Secondary | ICD-10-CM

## 2017-09-16 MED ORDER — HYDROCODONE-HOMATROPINE 5-1.5 MG/5ML PO SYRP: 5.0000 mL | ORAL_SOLUTION | Freq: Four times a day (QID) | ORAL | 0 refills | Status: DC | PRN

## 2017-09-16 NOTE — Assessment & Plan Note (Signed)
Stable on CT chest from 07/25/2017.  She needs a repeat scan in 6 months.

## 2017-09-16 NOTE — Patient Instructions (Signed)
Continue your Flonase and Dexilant as you have been taking them. Use Hycodan 5 cc up to every 6 hours if needed for cough suppression. This medication is meant to suppress your cough until you can follow with the pain clinic and get your usual Norco. I do not want to continue the Hycodan long term because it will conflict with your management plan from the pain clinic.  Follow with Dr [name] in 3 months or sooner if you have any problems.

## 2017-09-16 NOTE — Progress Notes (Signed)
Subjective:    Patient ID: [name] [name], female    DOB: August 17, 1960, 57 y.o.   MRN: [id number]  Cough  Associated symptoms include rhinorrhea and shortness of breath. Pertinent negatives include no ear pain, eye redness, fever, headaches, postnasal drip, rash, sore throat or wheezing.   58 year old smoker (20+ pack years), history of obesity, GERD. She has been followed in the past in our office for chronic cough. This was felt to be mostly upper airway in nature, was ascribed to GERD / chronic LPR. She was treated with cough suppression, narcotic cough syrup. She was on Reglan, not currently. She is currently on Dexilant. Then beginning about 1 yr ago she started to have more and more cough, has evolved paroxysms that are associated with SOB. She wonders if her reflux id well controlled, also notes that her Norco was decreased about 1 year ago. She wonders if that is part of the reason. She has not benefited from tessalon in the past. She is on fluticasone NS qd. Cough usually non-productive, occasionally thick white. She is occasionally SOB with exertion, household chores.   She underwent a sleep study 03/09/14 that show evidence for obstructive sleep apnea. I don't see any in PFT testing in the chart.                                        ROV 02/28/17 --this is a follow-up visit.  Patient is 50, continues to smoke approximately 3-4 cig a day, without any documented obstruction by spirometry.  We have followed her for a history of severe chronic cough, and a right upper lobe lobulated pulmonary nodule.  We repeated her CT scan of the chest 02/17/17 and I have reviewed.  This shows that the nodule is stable in size without any significant change in shape.  She returns today with continued cough. She is on flonase, Dexilant, loratadine. She is out of hycodan. She is to see Allergy in February.            ROV 05/26/17 --follow-up visit for patient with a history of tobacco use and obstructive lung  disease.  She also has severe chronic cough and pulmonary nodule that we have been following with serial CT scans of the chest, stable 02/17/2017.  She is not currently on scheduled bronchodilators.  She uses albuterol approximately.  She has a hx of frequent nausea and emesis, GERD. Has been followed by Dr [name]. She is on Flonase, Dexilant, Norco.  She tells me that she had an episode about 2 weeks ago when she saw blood after an episode of coughing and then some concomitant emesis. It was bright red, self limited. She had a reassuring EGD in January, has never had FOB. She is smoking a bit more, is about top go back on Chantix.                ROV 06/23/17 --57 year old woman who follows up today for her history of COPD, continued tobacco use, severe chronic cough that is been debilitating.  She has frequent GERD and nausea that are contributors, allergic rhinitis it is a contributor.  She has a right upper lobe pulmonary nodule that we followed with serial CT scans, stable on 02/17/2017.  We performed bronchoscopy 05/31/2017 because she had continued cough and also some episodic hemoptysis.  Her airway evaluation was normal.  AFB, fungal, cell count, cytology all  normal.  She continues to have persistent cough. She is using her nasal steroid and nasal washes. She ran out of benadryl, has used some loratadine. She is awakened at night with cough. Had her dexilant increased to 60mg . She doesn';t feel any burning.  She no longer has her norco/ vicodin until next week. She is only smoking a few cigarettes a week.   Acute OV 09/16/17 --patient has COPD, continued tobacco use, severe chronic cough in the setting of GERD, allergic rhinitis.  Bronchoscopy has shown a normal airway evaluation.  Cultures reassuring.  She has a right upper lobe nodule 1.8 x 1.4 cm that is stable since 11/10/2016 on serial CTs most recently 07/25/2017.  She is here today reporting that her cough has increased - more nasal congestion. She has  been quite upset and depressed due to the death of her mother last month. She is due to see Allergy in August. She is on flonase, dexilant. She tried [place].                                                                                                                                               Review of Systems  Constitutional: Negative for fever and unexpected weight change.  HENT: Positive for rhinorrhea. Negative for congestion, dental problem, ear pain, nosebleeds, postnasal drip, sinus pressure, sneezing, sore throat and trouble swallowing.   Eyes: Negative for redness and itching.  Respiratory: Positive for cough and shortness of breath. Negative for chest tightness and wheezing.   Cardiovascular: Negative for palpitations and leg swelling.  Gastrointestinal: Positive for nausea and vomiting.  Genitourinary: Negative for dysuria.  Musculoskeletal: Negative for joint swelling.  Skin: Negative for rash.  Neurological: Negative for headaches.  Hematological: Does not bruise/bleed easily.  Psychiatric/Behavioral: Negative for dysphoric mood. The patient is not nervous/anxious.         Objective:   Physical Exam  Vitals:   09/16/17 1543  BP: 110/80  Pulse: (!) 108  SpO2: 95%  Weight: 186 lb 9.6 oz (84.6 kg)  Height: 5\' 5"  (1.651 m)   Gen: pleasant, overwt woman, in no distress,  normal affect  ENT: No lesions,  mouth clear,  oropharynx clear, no postnasal drip  Neck: No JVD, no stridor  Lungs: No use of accessory muscles, clear without rales or rhonchi  Cardiovascular: RRR, heart sounds normal, no murmur or gallops, no peripheral edema  Musculoskeletal: No deformities, no cyanosis or clubbing  Neuro: alert, non focal  Skin: Warm, no lesions or rashes     Assessment & Plan:  Pulmonary nodule, right Stable on CT chest from 07/25/2017.  She needs a repeat scan in 6 months.  Cough Multifactorial cough with contributions from her chronic rhinitis (exacerbated recently  by a grief reaction to the death of her mother), GERD which is fairly well-controlled, functional/emotional component.  Also suspect there  is at least some component of narcotic dependence present.  She has been controlling it and her pain with Norco from the pain clinic.  Her appointment has been pushed back and she has had to go for over a week without her usual Norco.  Her cough is worse without it.  Difficult to know what to do in this situation but as we have been suppressing her cough with narcotics and she is being followed by the pain clinic on a stable regimen I am willing to give her a small prescription for Hycodan to tide her over until she can follow-up with them.  Continue her other GERD and allergy medication.  Baltazar Apo, MD, PhD 09/16/2017, 4:05 PM [facility] [phone] or if no answer [phone]

## 2017-09-16 NOTE — Assessment & Plan Note (Signed)
Multifactorial cough with contributions from her chronic rhinitis (exacerbated recently by a grief reaction to the death of her mother), GERD which is fairly well-controlled, functional/emotional component.  Also suspect there is at least some component of narcotic dependence present.  She has been controlling it and her pain with Norco from the pain clinic.  Her appointment has been pushed back and she has had to go for over a week without her usual Norco.  Her cough is worse without it.  Difficult to know what to do in this situation but as we have been suppressing her cough with narcotics and she is being followed by the pain clinic on a stable regimen I am willing to give her a small prescription for Hycodan to tide her over until she can follow-up with them.  Continue her other GERD and allergy medication.

## 2017-09-19 ENCOUNTER — Telehealth: Payer: Self-pay | Admitting: *Deleted

## 2017-09-19 NOTE — Telephone Encounter (Signed)
Returned patient's phone call regarding donating blood to [facility]. Patient stated,"Dr. Irene Limbo told me to [facility] to [facility] blood. They won't take my blood because it has to much iron in it. Can I come to the [place] and donate blood? Informed her that we don't collect blood for donations. I explained what a phlebotomy is and that the purpose is to remove iron from the blood, and then we throw the blood away. She said,"ask Dr. Irene Limbo can I have one of those." I told her I would give this message to him. She verbalized understanding.

## 2017-09-21 DIAGNOSIS — M5136 Other intervertebral disc degeneration, lumbar region: Secondary | ICD-10-CM | POA: Diagnosis not present

## 2017-09-21 DIAGNOSIS — Z5181 Encounter for therapeutic drug level monitoring: Secondary | ICD-10-CM | POA: Diagnosis not present

## 2017-09-21 DIAGNOSIS — M7060 Trochanteric bursitis, unspecified hip: Secondary | ICD-10-CM | POA: Diagnosis not present

## 2017-09-21 DIAGNOSIS — G894 Chronic pain syndrome: Secondary | ICD-10-CM | POA: Diagnosis not present

## 2017-09-22 ENCOUNTER — Telehealth: Payer: Self-pay | Admitting: Hematology

## 2017-09-22 HISTORY — DX: Other disorders of iron metabolism: E83.19

## 2017-09-22 HISTORY — DX: Other hemochromatosis: E83.118

## 2017-09-22 NOTE — Telephone Encounter (Signed)
Spoke with patient regarding scheduled appointments per 8/1 sch msg

## 2017-09-23 ENCOUNTER — Other Ambulatory Visit: Payer: PPO

## 2017-09-23 ENCOUNTER — Telehealth: Payer: Self-pay

## 2017-09-23 NOTE — Telephone Encounter (Signed)
Patient called to r/s appointment for today.per 8/2 phone que [name] [name] a time change

## 2017-09-26 ENCOUNTER — Telehealth: Payer: Self-pay

## 2017-09-26 ENCOUNTER — Other Ambulatory Visit: Payer: PPO

## 2017-09-26 NOTE — Telephone Encounter (Signed)
She called and left a message to call her about today's appt.  Called back. She is having cataract surgery tomorrow and worried about having phlebotomy today. Per Dr. Irene Limbo okay to cancel todays appt and keep 9/9 appt. She verbalized understanding.

## 2017-09-27 ENCOUNTER — Other Ambulatory Visit: Payer: Self-pay | Admitting: Internal Medicine

## 2017-09-30 ENCOUNTER — Other Ambulatory Visit: Payer: Self-pay | Admitting: Nurse Practitioner

## 2017-09-30 DIAGNOSIS — J309 Allergic rhinitis, unspecified: Secondary | ICD-10-CM

## 2017-09-30 NOTE — Telephone Encounter (Signed)
Please see refill request.

## 2017-09-30 NOTE — Telephone Encounter (Signed)
Reviewed chart pt is up-to-date sent refills to [facility].../lmb  

## 2017-10-06 DIAGNOSIS — F319 Bipolar disorder, unspecified: Secondary | ICD-10-CM | POA: Diagnosis not present

## 2017-10-07 ENCOUNTER — Telehealth: Payer: Self-pay | Admitting: Family

## 2017-10-07 ENCOUNTER — Other Ambulatory Visit (INDEPENDENT_AMBULATORY_CARE_PROVIDER_SITE_OTHER): Payer: PPO

## 2017-10-07 ENCOUNTER — Ambulatory Visit (INDEPENDENT_AMBULATORY_CARE_PROVIDER_SITE_OTHER): Payer: PPO | Admitting: Family

## 2017-10-07 ENCOUNTER — Encounter: Payer: Self-pay | Admitting: Family

## 2017-10-07 DIAGNOSIS — E782 Mixed hyperlipidemia: Secondary | ICD-10-CM

## 2017-10-07 DIAGNOSIS — S90466A Insect bite (nonvenomous), unspecified lesser toe(s), initial encounter: Secondary | ICD-10-CM

## 2017-10-07 DIAGNOSIS — W57XXXA Bitten or stung by nonvenomous insect and other nonvenomous arthropods, initial encounter: Secondary | ICD-10-CM

## 2017-10-07 LAB — CBC WITH DIFFERENTIAL/PLATELET
BASOS ABS: 0.1 10*3/uL (ref 0.0–0.1)
BASOS PCT: 1.3 % (ref 0.0–3.0)
Eosinophils Absolute: 0.2 10*3/uL (ref 0.0–0.7)
Eosinophils Relative: 3.3 % (ref 0.0–5.0)
HCT: 37.5 % (ref 36.0–46.0)
HEMOGLOBIN: 12.9 g/dL (ref 12.0–15.0)
LYMPHS PCT: 41.4 % (ref 12.0–46.0)
Lymphs Abs: 2.3 10*3/uL (ref 0.7–4.0)
MCHC: 34.4 g/dL (ref 30.0–36.0)
MONOS PCT: 9.9 % (ref 3.0–12.0)
Monocytes Absolute: 0.6 10*3/uL (ref 0.1–1.0)
NEUTROS ABS: 2.5 10*3/uL (ref 1.4–7.7)
Neutrophils Relative %: 44.1 % (ref 43.0–77.0)
PLATELETS: 146 10*3/uL — AB (ref 150.0–400.0)
RBC: 3.4 Mil/uL — ABNORMAL LOW (ref 3.87–5.11)
RDW: 13.8 % (ref 11.5–15.5)
WBC: 5.6 10*3/uL (ref 4.0–10.5)

## 2017-10-07 LAB — HEPATIC FUNCTION PANEL
ALT: 51 U/L — ABNORMAL HIGH (ref 0–35)
AST: 167 U/L — ABNORMAL HIGH (ref 0–37)
Albumin: 4.1 g/dL (ref 3.5–5.2)
Alkaline Phosphatase: 130 U/L — ABNORMAL HIGH (ref 39–117)
BILIRUBIN DIRECT: 0.7 mg/dL — AB (ref 0.0–0.3)
BILIRUBIN TOTAL: 1.8 mg/dL — AB (ref 0.2–1.2)
Total Protein: 7.9 g/dL (ref 6.0–8.3)

## 2017-10-07 LAB — LIPID PANEL
CHOL/HDL RATIO: 3
Cholesterol: 314 mg/dL — ABNORMAL HIGH (ref 0–200)
HDL: 120.8 mg/dL (ref >39.00)
LDL CALC: 168 mg/dL — AB (ref 0–99)
NonHDL: 193.06
Triglycerides: 126 mg/dL (ref 0.0–149.0)
VLDL: 25.2 mg/dL (ref 0.0–40.0)

## 2017-10-07 MED ORDER — DOXYCYCLINE HYCLATE 100 MG PO TABS: 100.0000 mg | ORAL_TABLET | Freq: Two times a day (BID) | ORAL | 0 refills | Status: DC

## 2017-10-07 NOTE — Telephone Encounter (Signed)
Yes, I would agree with NO OTC magnesium or potassium supplements. Let me get all of her labs back and then we can talk about options. I have had some patients swear by a "spoonful" of mustard to help with muscle cramps. She can try that in the interim.

## 2017-10-07 NOTE — Telephone Encounter (Signed)
Please advise. Thanks.  

## 2017-10-07 NOTE — Progress Notes (Signed)
[name] [name] is a 57 y.o. female with the following history as recorded in EpicCare:  Patient Active Problem List   Diagnosis Date Noted  . Iron overload 09/22/2017  . Secondary hemochromatosis 09/22/2017  . Allergic rhinitis 09/05/2017  . Hemoptysis 05/26/2017  . Pulmonary nodule, right 11/29/2016  . Polyarthralgia 12/02/2015  . Varicose veins of both lower extremities 12/02/2015  . Hypertensive retinopathy of both eyes 11/24/2015  . Elevated liver enzymes 10/31/2015  . Hyperglycemia 10/31/2015  . SIRS (systemic inflammatory response syndrome) ([place]) 03/11/2013  . Hypertension   . Hyperlipidemia   . Anemia   . Hematochezia 08/14/2012  . ETOH abuse 08/14/2012  . Pseudocyst of pancreas 08/12/2012  . Acute pancreatitis 08/12/2012  . GERD (gastroesophageal reflux disease) 06/08/2012  . Rectal fissure 05/11/2011  . Rectal fistula 05/11/2011  . Hiatal hernia 04/28/2011  . Gastritis 04/28/2011  . Nausea 04/27/2011  . Acute epigastric pain 04/27/2011  . Obesity, Class III, BMI 40-49.9 (morbid obesity) ([place]) 04/27/2011  . Solitary rectal ulcer 04/12/2011  . Hemorrhoids 03/10/2011  . Ulcerative proctitis, nonspecific ([place]) 03/10/2011  . DIVERTICULOSIS, COLON 10/22/2009  . COLONIC POLYPS, HYPERPLASTIC, HX OF 10/22/2009  . INFECTIOUS DIARRHEA 09/27/2008  . ULCERATIVE PROCTITIS 09/27/2008  . NAUSEA AND VOMITING 09/27/2008  . NAUSEA ALONE 09/27/2008  . DIARRHEA 09/27/2008  . ABDOMINAL PAIN -GENERALIZED 09/27/2008  . DEPRESSION 09/12/2007  . HEMORRHOIDS 09/12/2007  . HIATAL HERNIA 09/12/2007  . ULCERATIVE COLITIS 09/12/2007  . RECTAL PAIN 09/12/2007  . BIPOLAR AFFECTIVE DISORDER 12/06/2006  . TOBACCO ABUSE 12/06/2006  . Esophageal reflux 12/06/2006  . Cough 12/06/2006    Current Outpatient Medications  Medication Sig Dispense Refill  . albuterol (PROAIR HFA) 108 (90 Base) MCG/ACT inhaler Inhale 2 puffs into the lungs every 4 (four) hours as needed for wheezing or shortness of  breath. 1 Inhaler 5  . ARIPiprazole (ABILIFY) 30 MG tablet     . atorvastatin (LIPITOR) 40 MG tablet take 1 tablet by mouth at bedtime ON MONDAY, WEDNESDAY AND FRIDAY 90 tablet 0  . benzonatate (TESSALON) 200 MG capsule Take 1 capsule (200 mg total) by mouth every 6 (six) hours as needed for cough. 120 capsule 1  . brimonidine (ALPHAGAN) 0.2 % ophthalmic solution     . dexlansoprazole (DEXILANT) 60 MG capsule Take 1 capsule (60 mg total) by mouth daily. 30 capsule 6  . diclofenac sodium (VOLTAREN) 1 % GEL Apply 2 g topically 4 (four) times daily.     Marland Kitchen FLUoxetine (PROZAC) 20 MG capsule Take 20 mg by mouth 2 (two) times daily.     . fluticasone (FLONASE) 50 MCG/ACT nasal spray Place 2 sprays into both nostrils daily. 16 g 3  . HYDROcodone-acetaminophen (NORCO/VICODIN) 5-325 MG tablet Take 1 tablet by mouth 4 (four) times daily as needed for moderate pain.     Marland Kitchen HYDROcodone-homatropine (HYCODAN) 5-1.5 MG/5ML syrup Take 5 mLs by mouth every 6 (six) hours as needed for cough. 120 mL 0  . ketorolac (ACULAR) 0.5 % ophthalmic solution     . lamoTRIgine (LAMICTAL) 100 MG tablet Take 100 mg by mouth 2 (two) times daily.     . methocarbamol (ROBAXIN) 500 MG tablet Take 500 mg by mouth 2 (two) times daily as needed for muscle spasms.     . Multiple Vitamin (MULTIVITAMIN WITH MINERALS) TABS tablet Take 1 tablet by mouth daily. 30 tablet 0  . NARCAN 4 MG/0.1ML LIQD nasal spray kit Place 0.4 mg into the nose once.     Marland Kitchen  ofloxacin (OCUFLOX) 0.3 % ophthalmic solution     . prednisoLONE acetate (PRED FORTE) 1 % ophthalmic suspension     . promethazine (PHENERGAN) 25 MG suppository PLACE 1 SUPPOSITORY RECTALLY EVERY 6 HOURS AS NEEDED FOR NAUSEA OR VOMITING 30 suppository 0  . Tetrahydrozoline HCl (VISINE OP) Place 1-2 drops into both eyes daily.    . varenicline (CHANTIX PAK) 0.5 MG X 11 & 1 MG X 42 tablet Take one 0.5 mg tablet by mouth once daily for 3 days, then increase to one 0.5 mg tablet twice daily for 4  days, then increase to one 1 mg tablet twice daily. 53 tablet 0  . doxycycline (VIBRA-TABS) 100 MG tablet Take 1 tablet (100 mg total) by mouth 2 (two) times daily. 20 tablet 0   No current facility-administered medications for this visit.     Allergies: Patient has no known allergies.  Past Medical History:  Diagnosis Date  . Anal fistula   . Anemia   . Anxiety   . Arthritis   . Bipolar disorder ([place])   . Chronic kidney disease   . Depressive disorder, not elsewhere classified   . Diverticulosis of colon (without mention of hemorrhage)   . Esophageal reflux   . GERD (gastroesophageal reflux disease)   . Hepatic steatosis   . Hiatal hernia   . History of recurrent UTIs   . Hyperlipidemia   . Hypertension   . Iron deficiency   . Obesity   . Pancreatitis   . Pancreatitis   . Personal history of colonic polyps 10/16/2009   hyperplastic  . Pre-diabetes   . Seizures ([place])   . Ulcerative colitis, unspecified     Past Surgical History:  Procedure Laterality Date  . FINGER AMPUTATION  2010   right index  . HEMORROIDECTOMY    . KNEE ARTHROSCOPY     bilateral, left x2  . TOTAL ABDOMINAL HYSTERECTOMY    . VIDEO BRONCHOSCOPY Bilateral 05/31/2017   Procedure: VIDEO BRONCHOSCOPY WITHOUT FLUORO;  Surgeon: Collene Gobble, MD;  Location: [facility];  Service: Cardiopulmonary;  Laterality: Bilateral;    Family History  Problem Relation Age of Onset  . Irritable bowel syndrome Mother   . Hypertension Mother   . Atrial fibrillation Mother   . Ovarian cancer Maternal Aunt   . Diabetes Father   . Heart attack Father   . Hypertension Father   . Hyperlipidemia Father   . Thyroid disease Sister   . Hyperlipidemia Sister   . Thyroid disease Brother   . Colon polyps Brother   . Stomach cancer Neg Hx   . Colon cancer Neg Hx     Social History   Tobacco Use  . Smoking status: Current Every Day Smoker    Packs/day: 0.25    Types: Cigars, Cigarettes  . Smokeless tobacco: Never  Used  . Tobacco comment: pt states she smokes 3 cigarettes/ day. Trying to quit. Taking Chantix  Substance Use Topics  . Alcohol use: Yes    Alcohol/week: 1.0 standard drinks    Types: 1 Shots of liquor per week    Comment: occ    Subjective:  Patient presents with concerns for tick bite/ possible complications at site of bite; bite between 3rd and 4th toe- unsure how long tick was attached; does have surrounding redness; feels that area at bite is slightly numb;  Also wants to get her liver functions updated- planning to see liver specialist soon and wants "new labs" to review at that  appointment; still working on quitting alcohol completely- knows that it is absolutely necessary; does feel that Chantix is helping her work on goal to quit smoking.   Taking her Lipitor 3x per week; would like lipids checked also;    Objective:  Vitals:   10/07/17 0954  BP: 120/82  Pulse: 73  Temp: 98 F (36.7 C)  TempSrc: Oral  SpO2: 97%  Weight: 187 lb 0.6 oz (84.8 kg)  Height: '5\' 5"'$  (1.651 m)    General: Well developed, well nourished, in no acute distress  Skin : Warm and dry. Area of redness/ puncture wound noted at base/ between 3rd and 4th toe Head: Normocephalic and atraumatic  Lungs: Respirations unlabored; clear to auscultation bilaterally without wheeze, rales, rhonchi  Musculoskeletal: No deformities; no active joint inflammation  Extremities: No edema, cyanosis, clubbing  Vessels: Symmetric bilaterally  Neurologic: Alert and oriented; speech intact; face symmetrical; moves all extremities well; CNII-XII intact without focal deficit   Assessment:  1. Secondary hemochromatosis   2. Mixed hyperlipidemia   3. Tick bite, initial encounter     Plan:  1. Labs updated as requested; see hematology as scheduled for phlebotomy; 2. Update lipid panel; 3. Rx for Doxycycline 100 mg bid x 10 days;  Stressed need to quit smoking and quit alcohol; follow-up with liver specialist as scheduled.    No follow-ups on file.  Orders Placed This Encounter  Procedures  . CBC w/Diff    Standing Status:   Future    Number of Occurrences:   1    Standing Expiration Date:   10/07/2018  . Hepatic function panel    Standing Status:   Future    Number of Occurrences:   1    Standing Expiration Date:   10/07/2018  . Iron, TIBC and Ferritin Panel    Standing Status:   Future    Number of Occurrences:   1    Standing Expiration Date:   10/08/2018  . Lipid panel    Standing Status:   Future    Number of Occurrences:   1    Standing Expiration Date:   10/08/2018    Requested Prescriptions   Signed Prescriptions Disp Refills  . doxycycline (VIBRA-TABS) 100 MG tablet 20 tablet 0    Sig: Take 1 tablet (100 mg total) by mouth 2 (two) times daily.

## 2017-10-07 NOTE — Telephone Encounter (Signed)
Spoke with patient and results given. She will take the mustard for now.

## 2017-10-07 NOTE — Telephone Encounter (Signed)
Copied from [place] [id number]. Topic: General - Other >> Oct 07, 2017  3:09 PM Yvette Rack wrote: Reason for CRM: Pt states she experiences terrible leg cramps and she has otc magnesium and potassium but she would like to know how much to take since she does take other medications. Pt requests that a nurse calls her back. Cb# [phone]

## 2017-10-07 NOTE — Telephone Encounter (Signed)
Returned call to pt. Re: question about taking Magnesium and Potassium over the counter.  Stated she has leg cramps in bilat. calves, especially at night.  Stated she has a bottle of Magnesium and Potassium, OTC, and requested advice on what dose to take of each.  Also, reported she has used "Hyland's Leg Cramp medication", and questioned if [name] recommends that product.    Advised pt. that her last K+ level was 5.1, on 08/12/17, and last Magnesium level was 1.6, on 08/14/12.   Advised against taking any of the Potassium or Magnesium OTC supplements, until labs are checked, or is directed to do so, by provider.  Pt. Verb. Understanding. Agrees with plan.

## 2017-10-08 LAB — IRON,TIBC AND FERRITIN PANEL
%SAT: 49 % — AB (ref 16–45)
Ferritin: 386 ng/mL — ABNORMAL HIGH (ref 16–232)
Iron: 166 ug/dL — ABNORMAL HIGH (ref 45–160)
TIBC: 336 mcg/dL (calc) (ref 250–450)

## 2017-10-10 ENCOUNTER — Telehealth: Payer: Self-pay | Admitting: Family

## 2017-10-10 NOTE — Telephone Encounter (Signed)
No release to [facility].Thanks.

## 2017-10-10 NOTE — Telephone Encounter (Signed)
Copied from [place] [id number]. Topic: Quick Communication - See Telephone Encounter >> Oct 10, 2017  1:21 PM Sheran Luz wrote: CRM for notification. See Telephone encounter for: 10/10/17. Pt stated she was worried because she has not heard anything regarding her lab results. Pt is requesting a call back as soon as they are available.

## 2017-10-11 ENCOUNTER — Telehealth: Payer: Self-pay

## 2017-10-11 ENCOUNTER — Telehealth: Payer: Self-pay | Admitting: Hematology

## 2017-10-11 NOTE — Telephone Encounter (Signed)
Called and spoke with patient today regarding needing office visit prior to mammography being ordered early. She will call back for OV once she has her cataract surgery complete.

## 2017-10-11 NOTE — Telephone Encounter (Signed)
Patient called to ask if she can move her PHLEBOTOMY appointment from Sept 9 to August 22 or the 23rd.  Dr. Irene Limbo stated it would be ok with him.  A scheduling message was sent requesting the change.

## 2017-10-11 NOTE — Telephone Encounter (Signed)
Results given to patient today. 

## 2017-10-11 NOTE — Telephone Encounter (Signed)
Tried to reach regarding 8/23. I did leave a message

## 2017-10-14 ENCOUNTER — Inpatient Hospital Stay: Payer: PPO

## 2017-10-14 ENCOUNTER — Inpatient Hospital Stay: Payer: PPO | Attending: Hematology

## 2017-10-14 DIAGNOSIS — N189 Chronic kidney disease, unspecified: Secondary | ICD-10-CM | POA: Insufficient documentation

## 2017-10-14 DIAGNOSIS — I129 Hypertensive chronic kidney disease with stage 1 through stage 4 chronic kidney disease, or unspecified chronic kidney disease: Secondary | ICD-10-CM | POA: Diagnosis not present

## 2017-10-14 LAB — CBC WITH DIFFERENTIAL/PLATELET
BASOS ABS: 0.1 10*3/uL (ref 0.0–0.1)
Basophils Relative: 1 %
EOS PCT: 3 %
Eosinophils Absolute: 0.1 10*3/uL (ref 0.0–0.5)
HCT: 37.5 % (ref 34.8–46.6)
HEMOGLOBIN: 12.7 g/dL (ref 11.6–15.9)
LYMPHS PCT: 36 %
Lymphs Abs: 1.8 10*3/uL (ref 0.9–3.3)
MCH: 37.8 pg — ABNORMAL HIGH (ref 25.1–34.0)
MCHC: 34 g/dL (ref 31.5–36.0)
MCV: 111.2 fL — ABNORMAL HIGH (ref 79.5–101.0)
Monocytes Absolute: 0.4 10*3/uL (ref 0.1–0.9)
Monocytes Relative: 8 %
NEUTROS PCT: 52 %
Neutro Abs: 2.6 10*3/uL (ref 1.5–6.5)
PLATELETS: 165 10*3/uL (ref 145–400)
RBC: 3.37 MIL/uL — AB (ref 3.70–5.45)
RDW: 13.5 % (ref 11.2–14.5)
WBC: 4.9 10*3/uL (ref 3.9–10.3)

## 2017-10-14 LAB — FERRITIN: FERRITIN: 294 ng/mL (ref 11–307)

## 2017-10-14 NOTE — Progress Notes (Signed)
Per Dr. Irene Limbo ok to perform phlebotomy using labs from 8/16 ferritin 386  [name] [name] presents today for phlebotomy per MD orders. Phlebotomy procedure started at 1310 and ended at 1330. 505 grams removed using 20g needle with evacuated bag in left AC.  Patient observed for 30 minutes after procedure without any incident. Patient tolerated procedure well. IV needle removed intact. Nutrition offered.

## 2017-10-14 NOTE — Patient Instructions (Signed)

## 2017-10-18 DIAGNOSIS — H25811 Combined forms of age-related cataract, right eye: Secondary | ICD-10-CM | POA: Diagnosis not present

## 2017-10-18 DIAGNOSIS — H2511 Age-related nuclear cataract, right eye: Secondary | ICD-10-CM | POA: Diagnosis not present

## 2017-10-19 ENCOUNTER — Ambulatory Visit: Payer: PPO | Admitting: Allergy and Immunology

## 2017-10-21 ENCOUNTER — Telehealth: Payer: Self-pay | Admitting: Family

## 2017-10-21 ENCOUNTER — Ambulatory Visit: Payer: PPO | Admitting: Family

## 2017-10-21 NOTE — Telephone Encounter (Signed)
Pt called back in to be advised further by the office. Pt says that she is also taking a antibiotic for her eyes because she had cater act surgery. Pt would like to be sure that medication that she and CMA discussed will not counter react.   Please advise.

## 2017-10-21 NOTE — Telephone Encounter (Signed)
Copied from [place] [id number]. Topic: Quick Communication - Rx Refill/Question >> Oct 21, 2017 11:47 AM [name] [name] wrote: Medication: doxycycline (VIBRA-TABS) 100 MG tablet  Pt states that she took 3 days worth of this medication and then lost the bottle.  Pt has now found the bottle and wants to know if she should finish this out or not take anymore. Pt can be reached at [phone]

## 2017-10-21 NOTE — Telephone Encounter (Signed)
Called and spoke with patient and info given. 

## 2017-10-21 NOTE — Telephone Encounter (Signed)
Spoke with patient today. 

## 2017-10-21 NOTE — Telephone Encounter (Signed)
If the area where the tick bit her has normalized, she doesn't have to finish the antibiotics.

## 2017-10-26 DIAGNOSIS — M5136 Other intervertebral disc degeneration, lumbar region: Secondary | ICD-10-CM | POA: Diagnosis not present

## 2017-10-26 DIAGNOSIS — G894 Chronic pain syndrome: Secondary | ICD-10-CM | POA: Diagnosis not present

## 2017-10-26 DIAGNOSIS — Z5181 Encounter for therapeutic drug level monitoring: Secondary | ICD-10-CM | POA: Diagnosis not present

## 2017-10-26 DIAGNOSIS — M7060 Trochanteric bursitis, unspecified hip: Secondary | ICD-10-CM | POA: Diagnosis not present

## 2017-10-28 ENCOUNTER — Ambulatory Visit: Payer: PPO | Admitting: Family

## 2017-10-31 ENCOUNTER — Ambulatory Visit: Payer: Self-pay | Admitting: *Deleted

## 2017-10-31 ENCOUNTER — Other Ambulatory Visit: Payer: PPO

## 2017-10-31 NOTE — Telephone Encounter (Signed)
Pt calling to see if she should stop taking AZO before coming in for OV on tomorrow for UTI. Pt states she took 2 doses of AZO on yesterday and one dose this morning. Pt states symptoms had began to lessen but are now returning. Pt states that urine is now yellow and cloudy.Pt advised that she should stop taking AZO before OV to ensure symptoms would not be masked and so that a urine sample could be provided. Pt verbalized understanding. Reason for Disposition . Health Information question, no triage required and triager able to answer question  Protocols used: INFORMATION ONLY CALL-A-AH

## 2017-11-01 ENCOUNTER — Other Ambulatory Visit: Payer: PPO

## 2017-11-01 ENCOUNTER — Ambulatory Visit (INDEPENDENT_AMBULATORY_CARE_PROVIDER_SITE_OTHER): Payer: PPO | Admitting: Family

## 2017-11-01 ENCOUNTER — Encounter: Payer: Self-pay | Admitting: Family

## 2017-11-01 VITALS — BP 128/70 | HR 86 | Temp 98.7°F | Ht 65.0 in | Wt 187.1 lb

## 2017-11-01 DIAGNOSIS — Z1231 Encounter for screening mammogram for malignant neoplasm of breast: Secondary | ICD-10-CM

## 2017-11-01 DIAGNOSIS — E782 Mixed hyperlipidemia: Secondary | ICD-10-CM | POA: Diagnosis not present

## 2017-11-01 DIAGNOSIS — N390 Urinary tract infection, site not specified: Secondary | ICD-10-CM

## 2017-11-01 DIAGNOSIS — R748 Abnormal levels of other serum enzymes: Secondary | ICD-10-CM | POA: Diagnosis not present

## 2017-11-01 LAB — POC URINALSYSI DIPSTICK (AUTOMATED)
Bilirubin, UA: NEGATIVE
GLUCOSE UA: NEGATIVE
Ketones, UA: NEGATIVE
NITRITE UA: NEGATIVE
Protein, UA: POSITIVE — AB
Spec Grav, UA: 1.02 (ref 1.010–1.025)
UROBILINOGEN UA: 0.2 U/dL
pH, UA: 6 (ref 5.0–8.0)

## 2017-11-01 MED ORDER — ATORVASTATIN CALCIUM 40 MG PO TABS: 40.0000 mg | ORAL_TABLET | Freq: Every day | ORAL | 0 refills | Status: DC

## 2017-11-01 MED ORDER — NITROFURANTOIN MONOHYD MACRO 100 MG PO CAPS: 100.0000 mg | ORAL_CAPSULE | Freq: Two times a day (BID) | ORAL | 0 refills | Status: DC

## 2017-11-01 NOTE — Progress Notes (Signed)
[name] [name] is a 57 y.o. female with the following history as recorded in EpicCare:  Patient Active Problem List   Diagnosis Date Noted  . Iron overload 09/22/2017  . Secondary hemochromatosis 09/22/2017  . Allergic rhinitis 09/05/2017  . Hemoptysis 05/26/2017  . Pulmonary nodule, right 11/29/2016  . Polyarthralgia 12/02/2015  . Varicose veins of both lower extremities 12/02/2015  . Hypertensive retinopathy of both eyes 11/24/2015  . Elevated liver enzymes 10/31/2015  . Hyperglycemia 10/31/2015  . SIRS (systemic inflammatory response syndrome) ([place]) 03/11/2013  . Hypertension   . Hyperlipidemia   . Anemia   . Hematochezia 08/14/2012  . ETOH abuse 08/14/2012  . Pseudocyst of pancreas 08/12/2012  . Acute pancreatitis 08/12/2012  . GERD (gastroesophageal reflux disease) 06/08/2012  . Rectal fissure 05/11/2011  . Rectal fistula 05/11/2011  . Hiatal hernia 04/28/2011  . Gastritis 04/28/2011  . Nausea 04/27/2011  . Acute epigastric pain 04/27/2011  . Obesity, Class III, BMI 40-49.9 (morbid obesity) ([place]) 04/27/2011  . Solitary rectal ulcer 04/12/2011  . Hemorrhoids 03/10/2011  . Ulcerative proctitis, nonspecific ([place]) 03/10/2011  . DIVERTICULOSIS, COLON 10/22/2009  . COLONIC POLYPS, HYPERPLASTIC, HX OF 10/22/2009  . INFECTIOUS DIARRHEA 09/27/2008  . ULCERATIVE PROCTITIS 09/27/2008  . NAUSEA AND VOMITING 09/27/2008  . NAUSEA ALONE 09/27/2008  . DIARRHEA 09/27/2008  . ABDOMINAL PAIN -GENERALIZED 09/27/2008  . DEPRESSION 09/12/2007  . HEMORRHOIDS 09/12/2007  . HIATAL HERNIA 09/12/2007  . ULCERATIVE COLITIS 09/12/2007  . RECTAL PAIN 09/12/2007  . BIPOLAR AFFECTIVE DISORDER 12/06/2006  . TOBACCO ABUSE 12/06/2006  . Esophageal reflux 12/06/2006  . Cough 12/06/2006    Current Outpatient Medications  Medication Sig Dispense Refill  . ARIPiprazole (ABILIFY) 30 MG tablet     . atorvastatin (LIPITOR) 40 MG tablet Take 1 tablet (40 mg total) by mouth daily at 6 PM. take 1  tablet by mouth at bedtime 90 tablet 0  . brimonidine (ALPHAGAN) 0.2 % ophthalmic solution     . dexlansoprazole (DEXILANT) 60 MG capsule Take 1 capsule (60 mg total) by mouth daily. 30 capsule 6  . diclofenac sodium (VOLTAREN) 1 % GEL Apply 2 g topically 4 (four) times daily.     Marland Kitchen FLUoxetine (PROZAC) 20 MG capsule Take 20 mg by mouth 2 (two) times daily.     . fluticasone (FLONASE) 50 MCG/ACT nasal spray Place 2 sprays into both nostrils daily. 16 g 3  . HYDROcodone-acetaminophen (NORCO/VICODIN) 5-325 MG tablet Take 1 tablet by mouth 4 (four) times daily as needed for moderate pain.     Marland Kitchen HYDROcodone-homatropine (HYCODAN) 5-1.5 MG/5ML syrup Take 5 mLs by mouth every 6 (six) hours as needed for cough. 120 mL 0  . ketorolac (ACULAR) 0.5 % ophthalmic solution     . lamoTRIgine (LAMICTAL) 100 MG tablet Take 100 mg by mouth 2 (two) times daily.     . methocarbamol (ROBAXIN) 500 MG tablet Take 500 mg by mouth 2 (two) times daily as needed for muscle spasms.     . Multiple Vitamin (MULTIVITAMIN WITH MINERALS) TABS tablet Take 1 tablet by mouth daily. 30 tablet 0  . NARCAN 4 MG/0.1ML LIQD nasal spray kit Place 0.4 mg into the nose once.     Marland Kitchen ofloxacin (OCUFLOX) 0.3 % ophthalmic solution     . prednisoLONE acetate (PRED FORTE) 1 % ophthalmic suspension     . promethazine (PHENERGAN) 25 MG suppository PLACE 1 SUPPOSITORY RECTALLY EVERY 6 HOURS AS NEEDED FOR NAUSEA OR VOMITING 30 suppository 0  . Tetrahydrozoline HCl (  VISINE OP) Place 1-2 drops into both eyes daily.    . varenicline (CHANTIX PAK) 0.5 MG X 11 & 1 MG X 42 tablet Take one 0.5 mg tablet by mouth once daily for 3 days, then increase to one 0.5 mg tablet twice daily for 4 days, then increase to one 1 mg tablet twice daily. 53 tablet 0  . albuterol (PROAIR HFA) 108 (90 Base) MCG/ACT inhaler Inhale 2 puffs into the lungs every 4 (four) hours as needed for wheezing or shortness of breath. (Patient not taking: Reported on 11/01/2017) 1 Inhaler 5  .  benzonatate (TESSALON) 200 MG capsule Take 1 capsule (200 mg total) by mouth every 6 (six) hours as needed for cough. (Patient not taking: Reported on 11/01/2017) 120 capsule 1  . nitrofurantoin, macrocrystal-monohydrate, (MACROBID) 100 MG capsule Take 1 capsule (100 mg total) by mouth 2 (two) times daily. 14 capsule 0   No current facility-administered medications for this visit.     Allergies: Patient has no known allergies.  Past Medical History:  Diagnosis Date  . Anal fistula   . Anemia   . Anxiety   . Arthritis   . Bipolar disorder ([place])   . Chronic kidney disease   . Depressive disorder, not elsewhere classified   . Diverticulosis of colon (without mention of hemorrhage)   . Esophageal reflux   . GERD (gastroesophageal reflux disease)   . Hepatic steatosis   . Hiatal hernia   . History of recurrent UTIs   . Hyperlipidemia   . Hypertension   . Iron deficiency   . Obesity   . Pancreatitis   . Pancreatitis   . Personal history of colonic polyps 10/16/2009   hyperplastic  . Pre-diabetes   . Seizures ([place])   . Ulcerative colitis, unspecified     Past Surgical History:  Procedure Laterality Date  . FINGER AMPUTATION  2010   right index  . HEMORROIDECTOMY    . KNEE ARTHROSCOPY     bilateral, left x2  . TOTAL ABDOMINAL HYSTERECTOMY    . VIDEO BRONCHOSCOPY Bilateral 05/31/2017   Procedure: VIDEO BRONCHOSCOPY WITHOUT FLUORO;  Surgeon: Collene Gobble, MD;  Location: [facility];  Service: Cardiopulmonary;  Laterality: Bilateral;    Family History  Problem Relation Age of Onset  . Irritable bowel syndrome Mother   . Hypertension Mother   . Atrial fibrillation Mother   . Ovarian cancer Maternal Aunt   . Diabetes Father   . Heart attack Father   . Hypertension Father   . Hyperlipidemia Father   . Thyroid disease Sister   . Hyperlipidemia Sister   . Thyroid disease Brother   . Colon polyps Brother   . Stomach cancer Neg Hx   . Colon cancer Neg Hx     Social History    Tobacco Use  . Smoking status: Current Every Day Smoker    Packs/day: 0.25    Types: Cigars, Cigarettes  . Smokeless tobacco: Never Used  . Tobacco comment: pt states she smokes 3 cigarettes/ day. Trying to quit. Taking Chantix  Substance Use Topics  . Alcohol use: Yes    Alcohol/week: 1.0 standard drinks    Types: 1 Shots of liquor per week    Comment: occ    Subjective:  Patient presents with concerns for UTI; + burning, frequency x 2-3 days; using OTC Azo with some benefit; not prone to UTIs; Scheduled to see her liver specialist tomorrow; has been through one draw for hemachromatosis; admits still drinking  alcohol daily with known cirrhosis; still grieving recent loss of her mother and using alcohol to cope; consider grief counseling; Would like to review her last set of labs that were drawn here about 3 weeks ago;   Objective:  Vitals:   11/01/17 1020  BP: 128/70  Pulse: 86  Temp: 98.7 F (37.1 C)  TempSrc: Oral  SpO2: 95%  Weight: 187 lb 1.3 oz (84.9 kg)  Height: '5\' 5"'$  (1.651 m)    General: Well developed, well nourished, in no acute distress  Skin : Warm and dry.  Head: Normocephalic and atraumatic  Lungs: Respirations unlabored; clear to auscultation bilaterally without wheeze, rales, rhonchi  Musculoskeletal: No deformities; no active joint inflammation; negative CVA tenderness Extremities: No edema, cyanosis, clubbing  Vessels: Symmetric bilaterally  Neurologic: Alert and oriented; speech intact; face symmetrical; moves all extremities well; CNII-XII intact without focal deficit   Assessment:  1. Urinary tract infection without hematuria, site unspecified   2. Mixed hyperlipidemia   3. Screening mammogram, encounter for   4. Elevated liver enzymes     Plan:  1. Check U/A and urine culture; Rx for Macrobid 100 mg bid x7 days; increase fluids, rest; 2. Reviewed last lipid panel; encouraged to take her Lipitor daily; she agrees; will plan to re-check lipids  in 2 months; 3. Order for screening mammogram updated; 4. Keep planned appt with liver specialist for tomorrow; stressed need to quit drinking alcohol completely; she also agrees to look into grief counseling with [facility];  Flu shot deferred by patient;   No follow-ups on file.  Orders Placed This Encounter  Procedures  . Urine Culture    Standing Status:   Future    Standing Expiration Date:   11/01/2018  . MM Digital Screening    Standing Status:   Future    Standing Expiration Date:   01/02/2019    Order Specific Question:   Reason for Exam (SYMPTOM  OR DIAGNOSIS REQUIRED)    Answer:   screening mammogram    Order Specific Question:   Is the patient pregnant?    Answer:   No    Order Specific Question:   Preferred imaging location?    Answer:   [facility]    Requested Prescriptions   Signed Prescriptions Disp Refills  . atorvastatin (LIPITOR) 40 MG tablet 90 tablet 0    Sig: Take 1 tablet (40 mg total) by mouth daily at 6 PM. take 1 tablet by mouth at bedtime  . nitrofurantoin, macrocrystal-monohydrate, (MACROBID) 100 MG capsule 14 capsule 0    Sig: Take 1 capsule (100 mg total) by mouth 2 (two) times daily.

## 2017-11-01 NOTE — Addendum Note (Signed)
Addended by: Marcina Millard on: 11/01/2017 12:17 PM   Modules accepted: Orders

## 2017-11-02 ENCOUNTER — Telehealth: Payer: Self-pay | Admitting: Emergency Medicine

## 2017-11-02 ENCOUNTER — Telehealth: Payer: Self-pay | Admitting: *Deleted

## 2017-11-02 DIAGNOSIS — K700 Alcoholic fatty liver: Secondary | ICD-10-CM | POA: Diagnosis not present

## 2017-11-02 NOTE — Telephone Encounter (Signed)
Patient called and was inquiring about how long her new patient appt would be for her appointment in October? I informed her that it would be at least 2 hours.

## 2017-11-02 NOTE — Telephone Encounter (Signed)
Pt is aware that her last CT Chest was completed 07-25-17 and told to repeat in 6 months. Pt is aware to keep her apt coming up as well with RB to ensure her health tx plan is still working. Nothing more needed at this time.

## 2017-11-03 ENCOUNTER — Ambulatory Visit: Payer: PPO | Admitting: Allergy & Immunology

## 2017-11-03 LAB — URINE CULTURE
MICRO NUMBER: 91081621
SPECIMEN QUALITY: ADEQUATE

## 2017-11-07 ENCOUNTER — Other Ambulatory Visit: Payer: Self-pay | Admitting: Nurse Practitioner

## 2017-11-07 DIAGNOSIS — H2512 Age-related nuclear cataract, left eye: Secondary | ICD-10-CM | POA: Diagnosis not present

## 2017-11-07 DIAGNOSIS — H25012 Cortical age-related cataract, left eye: Secondary | ICD-10-CM | POA: Diagnosis not present

## 2017-11-08 ENCOUNTER — Other Ambulatory Visit: Payer: Self-pay | Admitting: Nurse Practitioner

## 2017-11-08 ENCOUNTER — Encounter: Payer: Self-pay | Admitting: Emergency Medicine

## 2017-11-08 ENCOUNTER — Ambulatory Visit (INDEPENDENT_AMBULATORY_CARE_PROVIDER_SITE_OTHER): Payer: PPO | Admitting: Emergency Medicine

## 2017-11-08 DIAGNOSIS — R911 Solitary pulmonary nodule: Secondary | ICD-10-CM | POA: Diagnosis not present

## 2017-11-08 DIAGNOSIS — R05 Cough: Secondary | ICD-10-CM | POA: Diagnosis not present

## 2017-11-08 DIAGNOSIS — F17200 Nicotine dependence, unspecified, uncomplicated: Secondary | ICD-10-CM | POA: Diagnosis not present

## 2017-11-08 DIAGNOSIS — J449 Chronic obstructive pulmonary disease, unspecified: Secondary | ICD-10-CM

## 2017-11-08 DIAGNOSIS — R059 Cough, unspecified: Secondary | ICD-10-CM

## 2017-11-08 DIAGNOSIS — K7469 Other cirrhosis of liver: Secondary | ICD-10-CM

## 2017-11-08 HISTORY — DX: Chronic obstructive pulmonary disease, unspecified: J44.9

## 2017-11-08 NOTE — Patient Instructions (Signed)
We will plan to repeat your CT scan of the chest in December to follow your right upper lobe pulmonary nodule. Congratulations on your efforts to decrease your smoking.  We will confer with your mental health center to determine whether there may be medical therapy that can be added onto your existing medicines that will help you stop. Keep your albuterol available to use 2 puffs if needed for shortness of breath, wheezing, chest tightness. You may benefit from the flu shot.  Let us know if you would like to get it. Follow with Dr [name] in 3 months or sooner if you have any problems.

## 2017-11-08 NOTE — Assessment & Plan Note (Signed)
We will plan to repeat your CT scan of the chest in December to follow your right upper lobe pulmonary nodule.

## 2017-11-08 NOTE — Assessment & Plan Note (Signed)
  Congratulations on your efforts to decrease your smoking.  We will confer with your mental health center to determine whether there may be medical therapy that can be added onto your existing medicines that will help you stop.

## 2017-11-08 NOTE — Assessment & Plan Note (Signed)
Better control since her Norco has been increased by her pain management center.  Continue her rhinitis and GERD regimens.

## 2017-11-08 NOTE — Progress Notes (Signed)
Subjective:    Patient ID: [name] [name], female    DOB: 1960-12-07, 57 y.o.   MRN: [id number]  Cough  Associated symptoms include rhinorrhea and shortness of breath. Pertinent negatives include no ear pain, eye redness, fever, headaches, postnasal drip, rash, sore throat or wheezing.   57 year old smoker (20+ pack years), history of obesity, GERD. She has been followed in the past in our office for chronic cough. This was felt to be mostly upper airway in nature, was ascribed to GERD / chronic LPR. She was treated with cough suppression, narcotic cough syrup. She was on Reglan, not currently. She is currently on Dexilant. Then beginning about 1 yr ago she started to have more and more cough, has evolved paroxysms that are associated with SOB. She wonders if her reflux id well controlled, also notes that her Norco was decreased about 1 year ago. She wonders if that is part of the reason. She has not benefited from tessalon in the past. She is on fluticasone NS qd. Cough usually non-productive, occasionally thick white. She is occasionally SOB with exertion, household chores.   She underwent a sleep study 03/09/14 that show evidence for obstructive sleep apnea. I don't see any in PFT testing in the chart.                                          ROV 11/08/17 --this is a follow-up visit for patient with tobacco use, COPD and severe chronic cough in the setting of this as well as GERD, allergic rhinitis.  She had a normal airway exam on bronchoscopy.  He has a 1.8 x 1.4 cm right upper lobe nodule that we have been following with CT scans of the chest since 11/10/2016.  Her most pressing issue has been non-resolving cough. She reports today that her cough has been better controlled since her Pain center MD has increased her Norco. Currently smoking 3-4 small cigars daily. She is interested in retrying chantix, but note that she is also on lamictal, prozac. She has albuterol to use prn. States that her  breathing has been doing well, uses rarely.                                                                                                                                               Review of Systems  Constitutional: Negative for fever and unexpected weight change.  HENT: Positive for rhinorrhea. Negative for congestion, dental problem, ear pain, nosebleeds, postnasal drip, sinus pressure, sneezing, sore throat and trouble swallowing.   Eyes: Negative for redness and itching.  Respiratory: Positive for cough and shortness of breath. Negative for chest tightness and wheezing.   Cardiovascular: Negative for palpitations and leg swelling.  Gastrointestinal: Positive for nausea and vomiting.  Genitourinary: Negative for dysuria.  Musculoskeletal: Negative for joint swelling.  Skin: Negative for rash.  Neurological: Negative for headaches.  Hematological: Does not bruise/bleed easily.  Psychiatric/Behavioral: Negative for dysphoric mood. The patient is not nervous/anxious.         Objective:   Physical Exam  Vitals:   11/08/17 0955  BP: 130/72  Pulse: 74  SpO2: 98%  Weight: 190 lb (86.2 kg)  Height: 5\' 5"  (1.651 m)   Gen: pleasant, overwt woman, in no distress,  normal affect  ENT: No lesions,  mouth clear,  oropharynx clear, no postnasal drip  Neck: No JVD, no stridor  Lungs: No use of accessory muscles, clear without rales or rhonchi  Cardiovascular: RRR, heart sounds normal, no murmur or gallops, no peripheral edema  Musculoskeletal: No deformities, no cyanosis or clubbing  Neuro: alert, non focal  Skin: Warm, no lesions or rashes     Assessment & Plan:  Pulmonary nodule, right We will plan to repeat your CT scan of the chest in December to follow your right upper lobe pulmonary nodule.  TOBACCO ABUSE  Congratulations on your efforts to decrease your smoking.  We will confer with your mental health center to determine whether there may be medical therapy that  can be added onto your existing medicines that will help you stop.  Cough Better control since her Norco has been increased by her pain management center.  Continue her rhinitis and GERD regimens.  COPD (chronic obstructive pulmonary disease) (HCC) Currently clinically stable on albuterol as needed.  Most pressing issue currently is smoking cessation.  Baltazar Apo, MD, PhD 11/08/2017, 10:22 AM [facility] [phone] or if no answer [phone]

## 2017-11-08 NOTE — Assessment & Plan Note (Signed)
Currently clinically stable on albuterol as needed.  Most pressing issue currently is smoking cessation.

## 2017-11-09 ENCOUNTER — Other Ambulatory Visit: Payer: PPO

## 2017-11-09 ENCOUNTER — Telehealth: Payer: Self-pay | Admitting: Emergency Medicine

## 2017-11-09 NOTE — Telephone Encounter (Signed)
[name] [name] # [phone]

## 2017-11-09 NOTE — Telephone Encounter (Signed)
Need to call Josph Macho with [facility] regarding her psych meds and the best choice for smoking cessation, chantix vs wellbutrin.

## 2017-11-10 NOTE — Telephone Encounter (Signed)
[name] [name].   I spoke with [facility] today. Josph Macho will call us back at office. Question is whether Pt can be on either wellbutrin or chantix for smoking cessation given her current psych meds.

## 2017-11-14 ENCOUNTER — Ambulatory Visit
Admission: RE | Admit: 2017-11-14 | Discharge: 2017-11-14 | Disposition: A | Discharge status: Home / Self Care | Payer: PPO | Source: Ambulatory Visit | Attending: Nurse Practitioner | Admitting: Nurse Practitioner

## 2017-11-14 DIAGNOSIS — K760 Fatty (change of) liver, not elsewhere classified: Secondary | ICD-10-CM | POA: Diagnosis not present

## 2017-11-14 DIAGNOSIS — K7469 Other cirrhosis of liver: Secondary | ICD-10-CM

## 2017-11-15 DIAGNOSIS — H25812 Combined forms of age-related cataract, left eye: Secondary | ICD-10-CM | POA: Diagnosis not present

## 2017-11-15 DIAGNOSIS — H2512 Age-related nuclear cataract, left eye: Secondary | ICD-10-CM | POA: Diagnosis not present

## 2017-11-15 NOTE — Telephone Encounter (Signed)
Called again. [name] [name] is going to call me back on my cell

## 2017-11-16 ENCOUNTER — Encounter: Payer: Self-pay | Admitting: Family

## 2017-11-16 ENCOUNTER — Other Ambulatory Visit: Payer: PPO

## 2017-11-16 ENCOUNTER — Ambulatory Visit (INDEPENDENT_AMBULATORY_CARE_PROVIDER_SITE_OTHER): Payer: PPO | Admitting: Family

## 2017-11-16 VITALS — BP 124/70 | HR 70 | Temp 98.7°F | Ht 65.0 in | Wt 192.1 lb

## 2017-11-16 DIAGNOSIS — R300 Dysuria: Secondary | ICD-10-CM

## 2017-11-16 DIAGNOSIS — L03114 Cellulitis of left upper limb: Secondary | ICD-10-CM | POA: Diagnosis not present

## 2017-11-16 MED ORDER — DOXYCYCLINE HYCLATE 100 MG PO TABS: 100.0000 mg | ORAL_TABLET | Freq: Two times a day (BID) | ORAL | 0 refills | Status: DC

## 2017-11-16 MED ORDER — VARENICLINE TARTRATE 0.5 MG X 11 & 1 MG X 42 PO MISC: ORAL | 0 refills | Status: DC

## 2017-11-16 NOTE — Progress Notes (Signed)
[name] [name] is a 57 y.o. female with the following history as recorded in EpicCare:  Patient Active Problem List   Diagnosis Date Noted  . COPD (chronic obstructive pulmonary disease) ([place]) 11/08/2017  . Iron overload 09/22/2017  . Secondary hemochromatosis 09/22/2017  . Allergic rhinitis 09/05/2017  . Hemoptysis 05/26/2017  . Pulmonary nodule, right 11/29/2016  . Polyarthralgia 12/02/2015  . Varicose veins of both lower extremities 12/02/2015  . Hypertensive retinopathy of both eyes 11/24/2015  . Elevated liver enzymes 10/31/2015  . Hyperglycemia 10/31/2015  . SIRS (systemic inflammatory response syndrome) ([place]) 03/11/2013  . Hypertension   . Hyperlipidemia   . Anemia   . Hematochezia 08/14/2012  . ETOH abuse 08/14/2012  . Pseudocyst of pancreas 08/12/2012  . Acute pancreatitis 08/12/2012  . GERD (gastroesophageal reflux disease) 06/08/2012  . Rectal fissure 05/11/2011  . Rectal fistula 05/11/2011  . Hiatal hernia 04/28/2011  . Gastritis 04/28/2011  . Nausea 04/27/2011  . Acute epigastric pain 04/27/2011  . Obesity, Class III, BMI 40-49.9 (morbid obesity) ([place]) 04/27/2011  . Solitary rectal ulcer 04/12/2011  . Hemorrhoids 03/10/2011  . Ulcerative proctitis, nonspecific ([place]) 03/10/2011  . DIVERTICULOSIS, COLON 10/22/2009  . COLONIC POLYPS, HYPERPLASTIC, HX OF 10/22/2009  . INFECTIOUS DIARRHEA 09/27/2008  . ULCERATIVE PROCTITIS 09/27/2008  . NAUSEA AND VOMITING 09/27/2008  . NAUSEA ALONE 09/27/2008  . DIARRHEA 09/27/2008  . ABDOMINAL PAIN -GENERALIZED 09/27/2008  . DEPRESSION 09/12/2007  . HEMORRHOIDS 09/12/2007  . HIATAL HERNIA 09/12/2007  . ULCERATIVE COLITIS 09/12/2007  . RECTAL PAIN 09/12/2007  . BIPOLAR AFFECTIVE DISORDER 12/06/2006  . TOBACCO ABUSE 12/06/2006  . Esophageal reflux 12/06/2006  . Cough 12/06/2006    Current Outpatient Medications  Medication Sig Dispense Refill  . ARIPiprazole (ABILIFY) 30 MG tablet     . atorvastatin (LIPITOR) 40 MG  tablet Take 1 tablet (40 mg total) by mouth daily at 6 PM. take 1 tablet by mouth at bedtime 90 tablet 0  . brimonidine (ALPHAGAN) 0.2 % ophthalmic solution     . dexlansoprazole (DEXILANT) 60 MG capsule Take 1 capsule (60 mg total) by mouth daily. 30 capsule 6  . diclofenac sodium (VOLTAREN) 1 % GEL Apply 2 g topically 4 (four) times daily.     Marland Kitchen FLUoxetine (PROZAC) 20 MG capsule Take 20 mg by mouth 2 (two) times daily.     . fluticasone (FLONASE) 50 MCG/ACT nasal spray Place 2 sprays into both nostrils daily. 16 g 3  . HYDROcodone-acetaminophen (NORCO/VICODIN) 5-325 MG tablet Take 1 tablet by mouth 4 (four) times daily as needed for moderate pain.     Marland Kitchen ketorolac (ACULAR) 0.5 % ophthalmic solution     . lamoTRIgine (LAMICTAL) 100 MG tablet Take 100 mg by mouth 2 (two) times daily.     . methocarbamol (ROBAXIN) 500 MG tablet Take 500 mg by mouth 2 (two) times daily as needed for muscle spasms.     . Multiple Vitamin (MULTIVITAMIN WITH MINERALS) TABS tablet Take 1 tablet by mouth daily. 30 tablet 0  . NARCAN 4 MG/0.1ML LIQD nasal spray kit Place 0.4 mg into the nose once.     Marland Kitchen ofloxacin (OCUFLOX) 0.3 % ophthalmic solution     . prednisoLONE acetate (PRED FORTE) 1 % ophthalmic suspension     . promethazine (PHENERGAN) 25 MG suppository PLACE 1 SUPPOSITORY RECTALLY EVERY 6 HOURS AS NEEDED FOR NAUSEA OR VOMITING 30 suppository 0  . Tetrahydrozoline HCl (VISINE OP) Place 1-2 drops into both eyes daily.    Marland Kitchen albuterol (  PROAIR HFA) 108 (90 Base) MCG/ACT inhaler Inhale 2 puffs into the lungs every 4 (four) hours as needed for wheezing or shortness of breath. (Patient not taking: Reported on 11/16/2017) 1 Inhaler 5  . doxycycline (VIBRA-TABS) 100 MG tablet Take 1 tablet (100 mg total) by mouth 2 (two) times daily. 20 tablet 0   No current facility-administered medications for this visit.     Allergies: Patient has no known allergies.  Past Medical History:  Diagnosis Date  . Anal fistula   . Anemia    . Anxiety   . Arthritis   . Bipolar disorder ([place])   . Chronic kidney disease   . Depressive disorder, not elsewhere classified   . Diverticulosis of colon (without mention of hemorrhage)   . Esophageal reflux   . GERD (gastroesophageal reflux disease)   . Hepatic steatosis   . Hiatal hernia   . History of recurrent UTIs   . Hyperlipidemia   . Hypertension   . Iron deficiency   . Obesity   . Pancreatitis   . Pancreatitis   . Personal history of colonic polyps 10/16/2009   hyperplastic  . Pre-diabetes   . Seizures ([place])   . Ulcerative colitis, unspecified     Past Surgical History:  Procedure Laterality Date  . FINGER AMPUTATION  2010   right index  . HEMORROIDECTOMY    . KNEE ARTHROSCOPY     bilateral, left x2  . TOTAL ABDOMINAL HYSTERECTOMY    . VIDEO BRONCHOSCOPY Bilateral 05/31/2017   Procedure: VIDEO BRONCHOSCOPY WITHOUT FLUORO;  Surgeon: Collene Gobble, MD;  Location: [facility];  Service: Cardiopulmonary;  Laterality: Bilateral;    Family History  Problem Relation Age of Onset  . Irritable bowel syndrome Mother   . Hypertension Mother   . Atrial fibrillation Mother   . Ovarian cancer Maternal Aunt   . Diabetes Father   . Heart attack Father   . Hypertension Father   . Hyperlipidemia Father   . Thyroid disease Sister   . Hyperlipidemia Sister   . Thyroid disease Brother   . Colon polyps Brother   . Stomach cancer Neg Hx   . Colon cancer Neg Hx     Social History   Tobacco Use  . Smoking status: Current Every Day Smoker    Packs/day: 0.25    Types: Cigars, Cigarettes  . Smokeless tobacco: Never Used  . Tobacco comment: pt states she smokes 3 cigarettes/ day. Trying to quit. Taking Chantix  Substance Use Topics  . Alcohol use: Yes    Alcohol/week: 1.0 standard drinks    Types: 1 Shots of liquor per week    Comment: occ    Subjective:  Patient presents with concerns for spider bite on left forearm; thinks bite may have occurred on Sunday; has  noticed worsening redness around the bite;  Completed antibiotic for recent UTI; has some burning last week; wants to make sure has resolved;  Had second cataract surgery yesterday;     Objective:  Vitals:   11/16/17 1053  BP: 124/70  Pulse: 70  Temp: 98.7 F (37.1 C)  TempSrc: Oral  SpO2: 97%  Weight: 192 lb 1.9 oz (87.1 kg)  Height: '5\' 5"'$  (1.651 m)    General: Well developed, well nourished, in no acute distress  Skin : Warm and dry. C/w puncture wound on left arm with surrounding erythema Head: Normocephalic and atraumatic  Lungs: Respirations unlabored; clear to auscultation bilaterally without wheeze, rales, rhonchi  Neurologic: Alert and  oriented; speech intact; face symmetrical; moves all extremities well; CNII-XII intact without focal deficit   Assessment:  1. Dysuria   2. Cellulitis of left upper extremity     Plan:  1. Check urine culture; 2. ? Spider bite- brown recluse; Rx for Doxycycline 100 mg bid x 10 days; follow-up if erythema starts to extend.  No follow-ups on file.  Orders Placed This Encounter  Procedures  . Urine Culture    Standing Status:   Future    Number of Occurrences:   1    Standing Expiration Date:   11/16/2018    Requested Prescriptions   Signed Prescriptions Disp Refills  . doxycycline (VIBRA-TABS) 100 MG tablet 20 tablet 0    Sig: Take 1 tablet (100 mg total) by mouth 2 (two) times daily.

## 2017-11-16 NOTE — Telephone Encounter (Signed)
Called and spoke to pt.  Pt has been made aware of below recommendations and possible side effects. Pt voiced her understanding. Rx for Chantix starter pack has been sent to preferred pharmacy. Nothing further is needed.

## 2017-11-16 NOTE — Telephone Encounter (Signed)
I spoke with Josph Macho, reviewed the pros / cons of starting therapy. He recommended Chantix as first choice. Notes that we should caution pt about the potential for vivid dreams, worsening of her depression.   Please let the patient know these recs, offer to start Chantix for her (starter pack) if she is ready to set a quit date.

## 2017-11-17 LAB — URINE CULTURE
MICRO NUMBER:: 91152554
SPECIMEN QUALITY: ADEQUATE

## 2017-11-28 ENCOUNTER — Inpatient Hospital Stay: Payer: PPO

## 2017-11-28 ENCOUNTER — Inpatient Hospital Stay: Payer: PPO | Attending: Hematology

## 2017-11-28 DIAGNOSIS — N189 Chronic kidney disease, unspecified: Secondary | ICD-10-CM | POA: Insufficient documentation

## 2017-11-28 DIAGNOSIS — I129 Hypertensive chronic kidney disease with stage 1 through stage 4 chronic kidney disease, or unspecified chronic kidney disease: Secondary | ICD-10-CM | POA: Insufficient documentation

## 2017-11-29 ENCOUNTER — Telehealth: Payer: Self-pay

## 2017-11-29 DIAGNOSIS — G894 Chronic pain syndrome: Secondary | ICD-10-CM | POA: Diagnosis not present

## 2017-11-29 DIAGNOSIS — M7060 Trochanteric bursitis, unspecified hip: Secondary | ICD-10-CM | POA: Diagnosis not present

## 2017-11-29 DIAGNOSIS — Z5181 Encounter for therapeutic drug level monitoring: Secondary | ICD-10-CM | POA: Diagnosis not present

## 2017-11-29 DIAGNOSIS — M5136 Other intervertebral disc degeneration, lumbar region: Secondary | ICD-10-CM | POA: Diagnosis not present

## 2017-11-29 NOTE — Telephone Encounter (Signed)
Spoke with patient and info given 

## 2017-11-29 NOTE — Telephone Encounter (Signed)
Copied from [place] [id number]. Topic: General - Other >> Nov 28, 2017  9:44 AM Chauncey Mann A wrote: Reason for CRM: Pt. Calling wanting to check on status of recent test in regards to liver function and see if [name] can possibly draw labs to check on her health status.  Pt. Chipped a tooth over the weekend and stated said she doesn't believe she is strong enough or healthy enough to get a pint of blood drawn today. Pt called [place] to reschedule her appt.   Pt is concerned about current health status.

## 2017-11-29 NOTE — Telephone Encounter (Signed)
I am sorry she is not feeling very well; please ask her to call her dentist for sure. I don't think we need to order the labs here- she can keep planned follow-up with hematology and her liver specialist.

## 2017-12-01 ENCOUNTER — Telehealth: Payer: Self-pay | Admitting: Hematology

## 2017-12-01 ENCOUNTER — Telehealth: Payer: Self-pay | Admitting: *Deleted

## 2017-12-01 ENCOUNTER — Other Ambulatory Visit: Payer: PPO

## 2017-12-01 ENCOUNTER — Telehealth: Payer: Self-pay

## 2017-12-01 ENCOUNTER — Ambulatory Visit: Payer: PPO | Admitting: Allergy & Immunology

## 2017-12-01 NOTE — Telephone Encounter (Signed)
LMVM for patient regarding r/s appt per 10/10 phone message

## 2017-12-01 NOTE — Telephone Encounter (Signed)
Return patient call to r/s phl appointment that was canceled at her request. Per 10/10 voice mail return call

## 2017-12-01 NOTE — Telephone Encounter (Signed)
FYI "[name] [name] [phone]) calling to cancel today's appointment for lab/phlebotomy.  I'm sick, dizzy, can't drive in.  Could I be scheduled for tomorrow?   Threw up cereal followed by sputum and about a pint of clear liquid.  I have post nasal drip, did not take Benadryl last night.  Do not need to see PCP."  Patient needs to speak with providers nurse before phlebotomy rescheduled per scheduling.

## 2017-12-02 ENCOUNTER — Telehealth: Payer: Self-pay | Admitting: *Deleted

## 2017-12-02 ENCOUNTER — Other Ambulatory Visit: Payer: PPO

## 2017-12-02 ENCOUNTER — Ambulatory Visit: Payer: PPO

## 2017-12-02 NOTE — Telephone Encounter (Signed)
Patient cancelled lab/phlebotomy appt for 10/10 due to not feeling well. Contacted patient 10/10 late afternoon. Patient had re-scheduledlab/phlebotomy appointment for 10/11 at 10:30. Called to ask if patient was feeling better and would be able to come on 10/11. Patient stated she felt better and intended to come 10/11 for lab/phlebotomy. Patient contacted 12/02/17 at 9:59 to verify that she was feeling better and would be coming to appointment. Patient stated she contacted Scheduling at 8:30 10/11 and left VM canceling 10/11 appointments. Has had a tooth break and is waiting to see if dentist can work her in today, otherwise will be seen by dentist next Thursday 10/17.  Unable to reschedule lab/phlebotomy at this time since she does not know when she will see dentist. Informed patient that office will call her on Monday to follow up on when she can reschedule with Dr. Irene Limbo.  Patient verbalized understanding and thanked caller.

## 2017-12-06 ENCOUNTER — Other Ambulatory Visit: Payer: Self-pay | Admitting: Internal Medicine

## 2017-12-07 ENCOUNTER — Ambulatory Visit: Payer: PPO

## 2017-12-08 ENCOUNTER — Telehealth: Payer: Self-pay | Admitting: Hematology

## 2017-12-08 ENCOUNTER — Inpatient Hospital Stay: Payer: PPO

## 2017-12-08 NOTE — Telephone Encounter (Signed)
Appt scheduled LMVM for patient with date/time per 10/17 sch msg

## 2017-12-12 ENCOUNTER — Telehealth: Payer: Self-pay

## 2017-12-12 ENCOUNTER — Inpatient Hospital Stay: Payer: PPO

## 2017-12-12 DIAGNOSIS — I129 Hypertensive chronic kidney disease with stage 1 through stage 4 chronic kidney disease, or unspecified chronic kidney disease: Secondary | ICD-10-CM | POA: Diagnosis not present

## 2017-12-12 DIAGNOSIS — N189 Chronic kidney disease, unspecified: Secondary | ICD-10-CM | POA: Diagnosis not present

## 2017-12-12 LAB — CBC WITH DIFFERENTIAL/PLATELET
ABS IMMATURE GRANULOCYTES: 0 10*3/uL (ref 0.00–0.07)
Basophils Absolute: 0 10*3/uL (ref 0.0–0.1)
Basophils Relative: 1 %
Eosinophils Absolute: 0.2 10*3/uL (ref 0.0–0.5)
Eosinophils Relative: 4 %
HEMATOCRIT: 34.3 % — AB (ref 36.0–46.0)
HEMOGLOBIN: 11.4 g/dL — AB (ref 12.0–15.0)
IMMATURE GRANULOCYTES: 0 %
LYMPHS ABS: 1.9 10*3/uL (ref 0.7–4.0)
LYMPHS PCT: 44 %
MCH: 35.5 pg — ABNORMAL HIGH (ref 26.0–34.0)
MCHC: 33.2 g/dL (ref 30.0–36.0)
MCV: 106.9 fL — AB (ref 80.0–100.0)
MONOS PCT: 11 %
Monocytes Absolute: 0.5 10*3/uL (ref 0.1–1.0)
NEUTROS ABS: 1.7 10*3/uL (ref 1.7–7.7)
Neutrophils Relative %: 40 %
Platelets: 144 10*3/uL — ABNORMAL LOW (ref 150–400)
RBC: 3.21 MIL/uL — ABNORMAL LOW (ref 3.87–5.11)
RDW: 13 % (ref 11.5–15.5)
WBC: 4.3 10*3/uL (ref 4.0–10.5)
nRBC: 0 % (ref 0.0–0.2)

## 2017-12-12 LAB — FERRITIN: Ferritin: 168 ng/mL (ref 11–307)

## 2017-12-12 NOTE — Telephone Encounter (Signed)
Attempt to f/u w/ pt regarding new appt for lab and phlebotomy. Noted per pt appts that she was rescheduled for today, 12/03/17, and already arrived to infusion for phlebotomy today.

## 2017-12-12 NOTE — Progress Notes (Signed)
Per Dr. Irene Limbo proceed with phlebotomy today based on last months ferritin. 2  Unsuccessful iv attempts the patient declined further venipunctures. Pt sent to [place] to r/s.

## 2017-12-12 NOTE — Patient Instructions (Signed)
R/s your phlebotomy.

## 2017-12-14 ENCOUNTER — Telehealth: Payer: Self-pay

## 2017-12-14 NOTE — Telephone Encounter (Signed)
Pt called requesting ferritin level result as she was unable to get phlebotomy yesterday after multiple IV attempts. Pt aware of new appt for Friday. Pt asked what she could do to prepare for Friday. Encouraged pt to drink 6-8 glasses of water or non-caffeinated drink per day tomorrow and Friday. Pt also encouraged to eat a good breakfast before coming to treatment, such as banana and peanut butter. Shared ferritin level of 168 per pt request and noted that Dr. Grier Mitts goal is for her ferritin to be < 100. Pt aware of parameter.   Pt verbalized some trouble with drinking alcohol and wanted to know what she can take or eat to help with electrolytes and energy. Dr. Irene Limbo notified. Ok with plan for snack and drinking water prior to appt. No additional recommendations. Encouraged pt to continue decreasing alcohol intake.

## 2017-12-16 ENCOUNTER — Inpatient Hospital Stay: Payer: PPO

## 2017-12-16 NOTE — Progress Notes (Signed)
Phlebotomy completed. Pt. Tolerated well. 505 g removed.

## 2017-12-16 NOTE — Patient Instructions (Signed)

## 2017-12-26 ENCOUNTER — Telehealth: Payer: Self-pay | Admitting: *Deleted

## 2017-12-26 NOTE — Telephone Encounter (Signed)
Patient called - last labs 10/21, last phlebotomy 10/25. Past lab appointments approximately every month. Next appt for labs and MD visit 03/13/18. Does she need to make earlier appt for labs only? Message sent to Dr. Irene Limbo with 2 nurses cc'd for review on 11/15.

## 2017-12-29 DIAGNOSIS — Z5181 Encounter for therapeutic drug level monitoring: Secondary | ICD-10-CM | POA: Diagnosis not present

## 2017-12-29 DIAGNOSIS — M7060 Trochanteric bursitis, unspecified hip: Secondary | ICD-10-CM | POA: Diagnosis not present

## 2017-12-29 DIAGNOSIS — G894 Chronic pain syndrome: Secondary | ICD-10-CM | POA: Diagnosis not present

## 2017-12-29 DIAGNOSIS — M5136 Other intervertebral disc degeneration, lumbar region: Secondary | ICD-10-CM | POA: Diagnosis not present

## 2018-01-02 ENCOUNTER — Ambulatory Visit: Payer: PPO | Admitting: Family

## 2018-01-06 ENCOUNTER — Ambulatory Visit: Payer: PPO | Admitting: Family

## 2018-01-06 DIAGNOSIS — Z0289 Encounter for other administrative examinations: Secondary | ICD-10-CM

## 2018-01-07 ENCOUNTER — Telehealth: Payer: Self-pay | Admitting: Physician Assistant

## 2018-01-07 MED ORDER — HYDROCORTISONE ACETATE 25 MG RE SUPP: 25.0000 mg | Freq: Two times a day (BID) | RECTAL | 1 refills | Status: DC

## 2018-01-07 NOTE — Telephone Encounter (Signed)
Patient paged on call physician at [facility] to return patient page re: bloody stools for 3/4 days-has had UC for years-?s - called patient x2 [facility] for patient to call us back if she has continued concerns. Will leave message for our nursing staff to call on Monday to check on her.  Ellouise Newer, PA-C

## 2018-01-07 NOTE — Telephone Encounter (Signed)
0954 4-5 BM bloody over the past 3-4 days, says feels similar to what appears to be history of proctitis per records- prescriptions found for Anusol suppositories in the past, last in 2017. Discussed with patient that I will send in Anusol supp BID x7 d for her and we will get her an appointment next week in clinic to follow up. (sent to [facility])  Ellouise Newer, PA-C

## 2018-01-12 ENCOUNTER — Telehealth: Payer: Self-pay | Admitting: *Deleted

## 2018-01-12 NOTE — Telephone Encounter (Signed)
Patient called: last labs 10/21, last phlebotomy 10/25. Past lab appointments approximately every month. Next appt for labs and MD visit 03/13/18. Will she need labs and/or another phlebotomy before Christmas?

## 2018-01-13 ENCOUNTER — Ambulatory Visit: Payer: PPO

## 2018-01-16 ENCOUNTER — Telehealth: Payer: Self-pay | Admitting: *Deleted

## 2018-01-16 NOTE — Telephone Encounter (Signed)
Contacted patient per Dr. Irene Limbo - Earlier appointment not needed. Patient verbalized understanding and thanked caller.

## 2018-01-16 NOTE — Telephone Encounter (Signed)
-----   Message from Brunetta Genera, MD sent at 01/16/2018  2:41 AM EST ----- Regarding: RE: Next Lab? Earlier appointment not necessary.  ----- Message ----- From: Rolland Bimler, RN Sent: 12/26/2017   3:26 PM EST To: Arty Baumgartner, RN, Brunetta Genera, MD, # Subject: Next Lab?                                      Patient called -  last labs 10/21, last phlebotomy 10/25. Past lab appointments approximately every month. Next appt for labs and MD visit 03/13/18. Does she need to make earlier appt for labs only?

## 2018-01-25 ENCOUNTER — Ambulatory Visit: Payer: PPO | Admitting: Primary Care

## 2018-01-25 ENCOUNTER — Telehealth: Payer: Self-pay | Admitting: Physician Assistant

## 2018-01-25 NOTE — Telephone Encounter (Signed)
Patient states she was prescribed hydrocortisone suppositories due to rectal swelling. Patient states she is now having rectal bleeding as well and wants to know if she should continue using the suppositories.

## 2018-01-25 NOTE — Telephone Encounter (Signed)
This pt was last seen by Dr [name] please route accordingly. Thank you

## 2018-01-30 ENCOUNTER — Ambulatory Visit: Payer: PPO | Admitting: Family

## 2018-01-30 ENCOUNTER — Other Ambulatory Visit: Payer: Self-pay | Admitting: Emergency Medicine

## 2018-01-30 ENCOUNTER — Telehealth: Payer: Self-pay | Admitting: Emergency Medicine

## 2018-01-30 NOTE — Telephone Encounter (Signed)
Attempted to call pt but unable to reach her. Left message for pt to return call. 

## 2018-01-30 NOTE — Telephone Encounter (Signed)
Pt is calling back [phone]

## 2018-01-30 NOTE — Telephone Encounter (Signed)
Called and spoke with pt who states she has been coughing for years but states the cough had been worse for weeks now. Pt states her cough is worse at night.  Stated to pt due to it being 3 months since we had seen her, stated to her we should get her to come in for an OV. Pt expressed understanding. OV scheduled for pt tomorrow, 01/31/18 with TP at 10:30. Pt was also given our new office information.nothing further needed.

## 2018-01-31 ENCOUNTER — Ambulatory Visit: Payer: PPO | Admitting: Adult Health

## 2018-01-31 NOTE — Telephone Encounter (Signed)
Spoke with pt and she states she did get the [facility] supp and they have helped. Reports she has pulmonary problems now and has called that office. Pt knows to call back if she has any other issues.

## 2018-02-01 ENCOUNTER — Ambulatory Visit: Payer: PPO | Admitting: Family

## 2018-02-01 DIAGNOSIS — M7060 Trochanteric bursitis, unspecified hip: Secondary | ICD-10-CM | POA: Diagnosis not present

## 2018-02-01 DIAGNOSIS — M5136 Other intervertebral disc degeneration, lumbar region: Secondary | ICD-10-CM | POA: Diagnosis not present

## 2018-02-01 DIAGNOSIS — G894 Chronic pain syndrome: Secondary | ICD-10-CM | POA: Diagnosis not present

## 2018-02-01 DIAGNOSIS — Z5181 Encounter for therapeutic drug level monitoring: Secondary | ICD-10-CM | POA: Diagnosis not present

## 2018-02-03 ENCOUNTER — Ambulatory Visit: Payer: PPO | Admitting: Family

## 2018-02-06 ENCOUNTER — Other Ambulatory Visit: Payer: Self-pay | Admitting: Family

## 2018-02-06 NOTE — Telephone Encounter (Signed)
She needs to get this from her psychiatrist who is managing this for her.

## 2018-02-06 NOTE — Telephone Encounter (Signed)
Pls advise on refill.../lmb 

## 2018-02-06 NOTE — Telephone Encounter (Signed)
Copied from [place] [id number]. Topic: Quick Communication - Rx Refill/Question >> Feb 06, 2018  1:25 PM Blase Mess A wrote: Medication: FLUoxetine (PROZAC) 20 MG capsule [[id number]]   Has the patient contacted their pharmacy? Yes  (Agent: If no, request that the patient contact the pharmacy for the refill.) (Agent: If yes, when and what did the pharmacy advise?)  Preferred Pharmacy (with phone number or street name): [facility] [address] [place] [address] [place] [address] Phone: [phone] Fax: [phone] Not a 24 hour pharmacy; exact hours not known.    Agent: Please be advised that RX refills may take up to 3 business days. We ask that you follow-up with your pharmacy.

## 2018-02-07 ENCOUNTER — Other Ambulatory Visit: Payer: Self-pay | Admitting: Family

## 2018-02-07 ENCOUNTER — Other Ambulatory Visit (INDEPENDENT_AMBULATORY_CARE_PROVIDER_SITE_OTHER): Payer: PPO

## 2018-02-07 ENCOUNTER — Ambulatory Visit (INDEPENDENT_AMBULATORY_CARE_PROVIDER_SITE_OTHER): Payer: PPO | Admitting: Family

## 2018-02-07 ENCOUNTER — Ambulatory Visit: Payer: PPO | Admitting: Family

## 2018-02-07 ENCOUNTER — Encounter: Payer: Self-pay | Admitting: Family

## 2018-02-07 VITALS — BP 118/84 | HR 80 | Temp 98.6°F | Ht 65.0 in | Wt 195.0 lb

## 2018-02-07 DIAGNOSIS — R5383 Other fatigue: Secondary | ICD-10-CM

## 2018-02-07 DIAGNOSIS — E785 Hyperlipidemia, unspecified: Secondary | ICD-10-CM

## 2018-02-07 DIAGNOSIS — J309 Allergic rhinitis, unspecified: Secondary | ICD-10-CM

## 2018-02-07 DIAGNOSIS — R635 Abnormal weight gain: Secondary | ICD-10-CM | POA: Diagnosis not present

## 2018-02-07 DIAGNOSIS — R252 Cramp and spasm: Secondary | ICD-10-CM

## 2018-02-07 DIAGNOSIS — E782 Mixed hyperlipidemia: Secondary | ICD-10-CM

## 2018-02-07 LAB — LIPID PANEL
Cholesterol: 302 mg/dL — ABNORMAL HIGH (ref 0–200)
HDL: 107.2 mg/dL (ref >39.00)
LDL Cholesterol: 162 mg/dL — ABNORMAL HIGH (ref 0–99)
NonHDL: 195.26
Total CHOL/HDL Ratio: 3
Triglycerides: 165 mg/dL — ABNORMAL HIGH (ref 0.0–149.0)
VLDL: 33 mg/dL (ref 0.0–40.0)

## 2018-02-07 LAB — VITAMIN B12: Vitamin B-12: 320 pg/mL (ref 211–911)

## 2018-02-07 LAB — CBC WITH DIFFERENTIAL/PLATELET
BASOS ABS: 0.1 10*3/uL (ref 0.0–0.1)
Basophils Relative: 1.7 % (ref 0.0–3.0)
Eosinophils Absolute: 0.2 10*3/uL (ref 0.0–0.7)
Eosinophils Relative: 3.1 % (ref 0.0–5.0)
HCT: 36.4 % (ref 36.0–46.0)
Hemoglobin: 12.2 g/dL (ref 12.0–15.0)
Lymphocytes Relative: 38.3 % (ref 12.0–46.0)
Lymphs Abs: 2.2 10*3/uL (ref 0.7–4.0)
MCHC: 33.5 g/dL (ref 30.0–36.0)
MCV: 101.8 fl — ABNORMAL HIGH (ref 78.0–100.0)
Monocytes Absolute: 0.7 10*3/uL (ref 0.1–1.0)
Monocytes Relative: 11.8 % (ref 3.0–12.0)
Neutro Abs: 2.6 10*3/uL (ref 1.4–7.7)
Neutrophils Relative %: 45.1 % (ref 43.0–77.0)
Platelets: 184 10*3/uL (ref 150.0–400.0)
RBC: 3.58 Mil/uL — ABNORMAL LOW (ref 3.87–5.11)
RDW: 15.1 % (ref 11.5–15.5)
WBC: 5.8 10*3/uL (ref 4.0–10.5)

## 2018-02-07 LAB — COMPREHENSIVE METABOLIC PANEL
ALK PHOS: 117 U/L (ref 39–117)
ALT: 37 U/L — AB (ref 0–35)
AST: 92 U/L — ABNORMAL HIGH (ref 0–37)
Albumin: 4 g/dL (ref 3.5–5.2)
BUN: 8 mg/dL (ref 6–23)
CALCIUM: 9.6 mg/dL (ref 8.4–10.5)
CO2: 28 mEq/L (ref 19–32)
Chloride: 99 mEq/L (ref 96–112)
Creatinine, Ser: 0.62 mg/dL (ref 0.40–1.20)
GFR: 105.44 mL/min (ref >60.00)
Glucose, Bld: 138 mg/dL — ABNORMAL HIGH (ref 70–99)
Potassium: 4.7 mEq/L (ref 3.5–5.1)
Sodium: 137 mEq/L (ref 135–145)
Total Bilirubin: 0.8 mg/dL (ref 0.2–1.2)
Total Protein: 7.8 g/dL (ref 6.0–8.3)

## 2018-02-07 LAB — HEMOGLOBIN A1C: HEMOGLOBIN A1C: 5.7 % (ref 4.6–6.5)

## 2018-02-07 LAB — MAGNESIUM: Magnesium: 1.5 mg/dL (ref 1.5–2.5)

## 2018-02-07 LAB — VITAMIN D 25 HYDROXY (VIT D DEFICIENCY, FRACTURES): VITD: 17.6 ng/mL — ABNORMAL LOW (ref 30.00–100.00)

## 2018-02-07 MED ORDER — FLUTICASONE PROPIONATE 50 MCG/ACT NA SUSP: 2.0000 | Freq: Every day | NASAL | 11 refills | Status: DC

## 2018-02-07 MED ORDER — VITAMIN D (ERGOCALCIFEROL) 1.25 MG (50000 UNIT) PO CAPS: 50000.0000 [IU] | ORAL_CAPSULE | ORAL | 0 refills | Status: DC

## 2018-02-07 MED ORDER — ATORVASTATIN CALCIUM 40 MG PO TABS: 40.0000 mg | ORAL_TABLET | Freq: Every day | ORAL | 1 refills | Status: DC

## 2018-02-07 NOTE — Telephone Encounter (Signed)
My chart message sent to patient and she does have appointment to come in today. If patient keeps appointment I will go over info with her this morning.

## 2018-02-07 NOTE — Progress Notes (Signed)
[name] [name] is a 57 y.o. female with the following history as recorded in EpicCare:  Patient Active Problem List   Diagnosis Date Noted  . COPD (chronic obstructive pulmonary disease) ([place]) 11/08/2017  . Iron overload 09/22/2017  . Secondary hemochromatosis 09/22/2017  . Allergic rhinitis 09/05/2017  . Hemoptysis 05/26/2017  . Pulmonary nodule, right 11/29/2016  . Polyarthralgia 12/02/2015  . Varicose veins of both lower extremities 12/02/2015  . Hypertensive retinopathy of both eyes 11/24/2015  . Elevated liver enzymes 10/31/2015  . Hyperglycemia 10/31/2015  . SIRS (systemic inflammatory response syndrome) ([place]) 03/11/2013  . Hypertension   . Hyperlipidemia   . Anemia   . Hematochezia 08/14/2012  . ETOH abuse 08/14/2012  . Pseudocyst of pancreas 08/12/2012  . Acute pancreatitis 08/12/2012  . GERD (gastroesophageal reflux disease) 06/08/2012  . Rectal fissure 05/11/2011  . Rectal fistula 05/11/2011  . Hiatal hernia 04/28/2011  . Gastritis 04/28/2011  . Nausea 04/27/2011  . Acute epigastric pain 04/27/2011  . Obesity, Class III, BMI 40-49.9 (morbid obesity) ([place]) 04/27/2011  . Solitary rectal ulcer 04/12/2011  . Hemorrhoids 03/10/2011  . Ulcerative proctitis, nonspecific ([place]) 03/10/2011  . DIVERTICULOSIS, COLON 10/22/2009  . COLONIC POLYPS, HYPERPLASTIC, HX OF 10/22/2009  . INFECTIOUS DIARRHEA 09/27/2008  . ULCERATIVE PROCTITIS 09/27/2008  . NAUSEA AND VOMITING 09/27/2008  . NAUSEA ALONE 09/27/2008  . DIARRHEA 09/27/2008  . ABDOMINAL PAIN -GENERALIZED 09/27/2008  . DEPRESSION 09/12/2007  . HEMORRHOIDS 09/12/2007  . HIATAL HERNIA 09/12/2007  . ULCERATIVE COLITIS 09/12/2007  . RECTAL PAIN 09/12/2007  . BIPOLAR AFFECTIVE DISORDER 12/06/2006  . TOBACCO ABUSE 12/06/2006  . Esophageal reflux 12/06/2006  . Cough 12/06/2006    Current Outpatient Medications  Medication Sig Dispense Refill  . ARIPiprazole (ABILIFY) 30 MG tablet     . atorvastatin (LIPITOR) 40 MG  tablet Take 1 tablet (40 mg total) by mouth daily at 6 PM. take 1 tablet by mouth at bedtime 90 tablet 0  . benzonatate (TESSALON) 200 MG capsule TAKE ONE CAPSULE BY MOUTH EVERY 6 HOURS AS NEEDED FOR COUGH 20 capsule 0  . brimonidine (ALPHAGAN) 0.2 % ophthalmic solution     . dexlansoprazole (DEXILANT) 60 MG capsule Take 1 capsule (60 mg total) by mouth daily. 30 capsule 6  . diclofenac sodium (VOLTAREN) 1 % GEL Apply 2 g topically 4 (four) times daily.     Marland Kitchen doxycycline (VIBRA-TABS) 100 MG tablet Take 1 tablet (100 mg total) by mouth 2 (two) times daily. 20 tablet 0  . FLUoxetine (PROZAC) 20 MG capsule Take 20 mg by mouth 2 (two) times daily.     . fluticasone (FLONASE) 50 MCG/ACT nasal spray Place 2 sprays into both nostrils daily. 16 g 11  . HYDROcodone-acetaminophen (NORCO/VICODIN) 5-325 MG tablet Take 1 tablet by mouth 4 (four) times daily as needed for moderate pain.     . hydrocortisone (ANUSOL-HC) 25 MG suppository Place 1 suppository (25 mg total) rectally 2 (two) times daily. For 7 days. 14 suppository 1  . lamoTRIgine (LAMICTAL) 100 MG tablet Take 100 mg by mouth 2 (two) times daily.     . methocarbamol (ROBAXIN) 500 MG tablet Take 500 mg by mouth 2 (two) times daily as needed for muscle spasms.     . Multiple Vitamin (MULTIVITAMIN WITH MINERALS) TABS tablet Take 1 tablet by mouth daily. 30 tablet 0  . NARCAN 4 MG/0.1ML LIQD nasal spray kit Place 0.4 mg into the nose once.     . promethazine (PHENERGAN) 25 MG suppository PLACE  1 SUPPOSITORY RECTALLY EVERY 6 HOURS AS NEEDED FOR NAUSEA OR VOMITING 30 suppository 0  . varenicline (CHANTIX PAK) 0.5 MG X 11 & 1 MG X 42 tablet Take 0.5 mg tab po qd x3 days, then increase to 0.5 mg tab bid for 4 days, then increase to one 1 mg tablet twice daily. 53 tablet 0  . albuterol (PROAIR HFA) 108 (90 Base) MCG/ACT inhaler Inhale 2 puffs into the lungs every 4 (four) hours as needed for wheezing or shortness of breath. (Patient not taking: Reported on  11/16/2017) 1 Inhaler 5  . ketorolac (ACULAR) 0.5 % ophthalmic solution     . ofloxacin (OCUFLOX) 0.3 % ophthalmic solution     . prednisoLONE acetate (PRED FORTE) 1 % ophthalmic suspension     . Tetrahydrozoline HCl (VISINE OP) Place 1-2 drops into both eyes daily.     No current facility-administered medications for this visit.     Allergies: Patient has no known allergies.  Past Medical History:  Diagnosis Date  . Anal fistula   . Anemia   . Anxiety   . Arthritis   . Bipolar disorder ([place])   . Chronic kidney disease   . Depressive disorder, not elsewhere classified   . Diverticulosis of colon (without mention of hemorrhage)   . Esophageal reflux   . GERD (gastroesophageal reflux disease)   . Hepatic steatosis   . Hiatal hernia   . History of recurrent UTIs   . Hyperlipidemia   . Hypertension   . Iron deficiency   . Obesity   . Pancreatitis   . Pancreatitis   . Personal history of colonic polyps 10/16/2009   hyperplastic  . Pre-diabetes   . Seizures ([place])   . Ulcerative colitis, unspecified     Past Surgical History:  Procedure Laterality Date  . FINGER AMPUTATION  2010   right index  . HEMORROIDECTOMY    . KNEE ARTHROSCOPY     bilateral, left x2  . TOTAL ABDOMINAL HYSTERECTOMY    . VIDEO BRONCHOSCOPY Bilateral 05/31/2017   Procedure: VIDEO BRONCHOSCOPY WITHOUT FLUORO;  Surgeon: Collene Gobble, MD;  Location: [facility];  Service: Cardiopulmonary;  Laterality: Bilateral;    Family History  Problem Relation Age of Onset  . Irritable bowel syndrome Mother   . Hypertension Mother   . Atrial fibrillation Mother   . Ovarian cancer Maternal Aunt   . Diabetes Father   . Heart attack Father   . Hypertension Father   . Hyperlipidemia Father   . Thyroid disease Sister   . Hyperlipidemia Sister   . Thyroid disease Brother   . Colon polyps Brother   . Stomach cancer Neg Hx   . Colon cancer Neg Hx     Social History   Tobacco Use  . Smoking status: Current Every  Day Smoker    Packs/day: 0.25    Types: Cigars, Cigarettes  . Smokeless tobacco: Never Used  . Tobacco comment: pt states she smokes 3 cigarettes/ day. Trying to quit. Taking Chantix  Substance Use Topics  . Alcohol use: Yes    Alcohol/week: 1.0 standard drinks    Types: 1 Shots of liquor per week    Comment: occ    Subjective:  Patient presents with concerns for chronic cough- known since 1995; admits had been off her Joes for a week prior to onset of symptoms; now back on both medications and symptoms improved; "just wanted to mention it." Is scheduled to see her pulmonologist in  follow-up;  Requesting labs today to make today to check potassium, magnesium; has been having some leg cramps recently; continuing to work with oncology for hemachromatosis/ liver specialist; trying to limit intake of alcohol; not due for another blood draw with the hemachromatosis until January 2020;  Also wants to re-check her lipid panel- admits not taking her Lipitor regularly;     Objective:  Vitals:   02/07/18 0947  BP: 118/84  Pulse: 80  Temp: 98.6 F (37 C)  TempSrc: Oral  SpO2: 96%  Weight: 195 lb 0.6 oz (88.5 kg)  Height: '5\' 5"'$  (1.651 m)    General: Well developed, well nourished, in no acute distress  Skin : Warm and dry.  Head: Normocephalic and atraumatic  Lungs: Respirations unlabored; clear to auscultation bilaterally without wheeze, rales, rhonchi  Musculoskeletal: No deformities; no active joint inflammation  Extremities: No edema, cyanosis, clubbing; varicose veins noted on calf of right leg  Vessels: Symmetric bilaterally  Neurologic: Alert and oriented; speech intact; face symmetrical; moves all extremities well; CNII-XII intact without focal deficit   Assessment:  1. Other fatigue   2. Chronic allergic rhinitis   3. Muscle cramps   4. Hyperlipidemia, unspecified hyperlipidemia type   5. Weight gain     Plan:  1. Check CBC, CMP today; continue to work on  goal of completely abstaining from alcohol; 2. Refill on Flonase; 3. Check magnesium, B12, Vitamin D today; 4. Has not been taking Lipitor; update lipid panel- ? Need to change to Crestor; 5. Work on limiting intake of carbs/ refined sugars and fats; check Hgba1c;  Keep planned follow-up with pulmonology; continue with pain management, psychiatry, hematology and liver specialist.   No follow-ups on file.  Orders Placed This Encounter  Procedures  . CBC w/Diff    Standing Status:   Future    Number of Occurrences:   1    Standing Expiration Date:   02/07/2019  . Comp Met (CMET)    Standing Status:   Future    Number of Occurrences:   1    Standing Expiration Date:   02/07/2019  . Magnesium    Standing Status:   Future    Number of Occurrences:   1    Standing Expiration Date:   02/07/2019  . B12    Standing Status:   Future    Number of Occurrences:   1    Standing Expiration Date:   02/07/2019  . Vitamin D (25 hydroxy)    Standing Status:   Future    Number of Occurrences:   1    Standing Expiration Date:   02/07/2019  . Lipid panel    Standing Status:   Future    Number of Occurrences:   1    Standing Expiration Date:   02/08/2019  . HgB A1c    Standing Status:   Future    Number of Occurrences:   1    Standing Expiration Date:   02/07/2019    Requested Prescriptions   Signed Prescriptions Disp Refills  . fluticasone (FLONASE) 50 MCG/ACT nasal spray 16 g 11    Sig: Place 2 sprays into both nostrils daily.

## 2018-02-08 ENCOUNTER — Other Ambulatory Visit: Payer: Self-pay | Admitting: Family

## 2018-02-08 DIAGNOSIS — J309 Allergic rhinitis, unspecified: Secondary | ICD-10-CM

## 2018-02-08 MED ORDER — FLUTICASONE PROPIONATE 50 MCG/ACT NA SUSP: 2.0000 | Freq: Every day | NASAL | 3 refills | Status: DC

## 2018-02-09 ENCOUNTER — Telehealth: Payer: Self-pay

## 2018-02-09 NOTE — Telephone Encounter (Signed)
Copied from [place] [id number]. Topic: General - Other >> Feb 09, 2018 11:37 AM Ivar Drape wrote: Reason for CRM:   Patient was told to take B12 tablets.  She said she has vitamins that are B Complex and the B12 says it's 100% of the daily recommended dose.  She wants to know if this is ok to take.  Please advise.  Advised patient this is fine to take, also would be good idea to eat foods high in vitamin b12 to hopefully boost her level a little quicker, make sure to keep follow up with laura to recheck lab value again

## 2018-02-24 ENCOUNTER — Ambulatory Visit: Payer: PPO

## 2018-02-27 ENCOUNTER — Telehealth: Payer: Self-pay | Admitting: Emergency Medicine

## 2018-02-27 ENCOUNTER — Ambulatory Visit: Payer: PPO | Admitting: Emergency Medicine

## 2018-02-27 DIAGNOSIS — R918 Other nonspecific abnormal finding of lung field: Secondary | ICD-10-CM

## 2018-02-27 NOTE — Telephone Encounter (Signed)
Per chart, pt has been already scheduled for a CT that was ordered today for 1/15.  I made pt aware of this date, time, and location.   Also scheduled pt an OV with RB on 1/21 to review CT.  Nothing further needed at this time.

## 2018-02-27 NOTE — Telephone Encounter (Signed)
Will work on scheduling this

## 2018-02-27 NOTE — Telephone Encounter (Signed)
Called and spoke with Patient.  She stated that she is due for her Ct chest for follow up on a nodule. She was requesting that it be scheduled and if she can have her follow up with RB after CT has been completed to go over results.   Dr. [name], please advise

## 2018-02-27 NOTE — Telephone Encounter (Signed)
Order has been placed for CT per RB.  Attempted to contact pt. I did not receive an answer. There was not an option for me to leave a message. Will try back.

## 2018-02-27 NOTE — Telephone Encounter (Signed)
Pt is calling back [phone]

## 2018-03-01 ENCOUNTER — Telehealth: Payer: Self-pay | Admitting: Hematology

## 2018-03-01 NOTE — Telephone Encounter (Signed)
Called patient per voicemial log 12/31. Patient stated she will keep her original appointment time.

## 2018-03-06 ENCOUNTER — Other Ambulatory Visit: Payer: Self-pay | Admitting: Internal Medicine

## 2018-03-06 DIAGNOSIS — M7060 Trochanteric bursitis, unspecified hip: Secondary | ICD-10-CM | POA: Diagnosis not present

## 2018-03-06 DIAGNOSIS — M5136 Other intervertebral disc degeneration, lumbar region: Secondary | ICD-10-CM | POA: Diagnosis not present

## 2018-03-06 DIAGNOSIS — G894 Chronic pain syndrome: Secondary | ICD-10-CM | POA: Diagnosis not present

## 2018-03-06 DIAGNOSIS — Z5181 Encounter for therapeutic drug level monitoring: Secondary | ICD-10-CM | POA: Diagnosis not present

## 2018-03-08 ENCOUNTER — Ambulatory Visit (INDEPENDENT_AMBULATORY_CARE_PROVIDER_SITE_OTHER)
Admission: RE | Admit: 2018-03-08 | Discharge: 2018-03-08 | Disposition: A | Discharge status: Home / Self Care | Payer: PPO | Source: Ambulatory Visit | Attending: Emergency Medicine | Admitting: Emergency Medicine

## 2018-03-08 ENCOUNTER — Telehealth: Payer: Self-pay | Admitting: Emergency Medicine

## 2018-03-08 DIAGNOSIS — R911 Solitary pulmonary nodule: Secondary | ICD-10-CM | POA: Diagnosis not present

## 2018-03-08 DIAGNOSIS — R918 Other nonspecific abnormal finding of lung field: Secondary | ICD-10-CM

## 2018-03-08 NOTE — Telephone Encounter (Signed)
I had just sent a result note to LL - OK for either of you to give her results:   CT chest shows stable pulmonary nodules, no change in size or appearance. This is good news. She will need a repeat in 9-12 months. We can decide on the exact timing when we follow up in office.

## 2018-03-08 NOTE — Telephone Encounter (Signed)
Called and spoke with patient, she stated that she has 5 appointments coming up within a week and she does not have the money for them since she just had mouth surgery. Patient had a CT scan done today and she wanted to know if she needed to come in for her appointment with RB or if she can just get her results from this over the phone. RB please advise, thank you.

## 2018-03-08 NOTE — Telephone Encounter (Signed)
Called and spoke with pt stating the results of the CT scan. Pt expressed understanding. Pt stated to me she will keep her appt that is scheduled with RB 03/14/2018 at 9:45 due to questions she has. Nothing further needed.

## 2018-03-13 ENCOUNTER — Telehealth: Payer: Self-pay | Admitting: Hematology

## 2018-03-13 ENCOUNTER — Inpatient Hospital Stay: Payer: PPO | Admitting: Hematology

## 2018-03-13 ENCOUNTER — Telehealth: Payer: Self-pay | Admitting: Emergency Medicine

## 2018-03-13 ENCOUNTER — Inpatient Hospital Stay: Payer: PPO

## 2018-03-13 NOTE — Telephone Encounter (Signed)
R/s appt per 1/20 sch message - pt is aware of appt date and time   

## 2018-03-13 NOTE — Telephone Encounter (Signed)
Spoke with pt. She has some questions about her most recent CT. I have answered her questions to the best of my ability. Nothing further was needed at this time.

## 2018-03-14 ENCOUNTER — Ambulatory Visit: Payer: PPO | Admitting: Emergency Medicine

## 2018-03-22 ENCOUNTER — Other Ambulatory Visit: Payer: Self-pay | Admitting: Physician Assistant

## 2018-03-29 ENCOUNTER — Inpatient Hospital Stay: Payer: PPO | Admitting: Hematology

## 2018-03-29 ENCOUNTER — Telehealth: Payer: Self-pay | Admitting: *Deleted

## 2018-03-29 ENCOUNTER — Inpatient Hospital Stay: Payer: PPO

## 2018-03-29 NOTE — Telephone Encounter (Signed)
Patient called to cancel today's appointment due to illness. Advised her that scheduling will be notified and contact her to reschedule. Patient states she may have her labs drawn at next PCP appointment and sent to [facility]. [place]. She will contact office for directions following appt with PCP.

## 2018-03-30 ENCOUNTER — Telehealth: Payer: Self-pay | Admitting: Hematology

## 2018-03-30 NOTE — Telephone Encounter (Signed)
Scheduled appt per 2/5 sch message - pt is aware of appt date and time   

## 2018-03-31 ENCOUNTER — Ambulatory Visit: Payer: PPO | Admitting: Family

## 2018-03-31 ENCOUNTER — Other Ambulatory Visit: Payer: Self-pay | Admitting: Internal Medicine

## 2018-04-05 ENCOUNTER — Other Ambulatory Visit (INDEPENDENT_AMBULATORY_CARE_PROVIDER_SITE_OTHER): Payer: PPO

## 2018-04-05 ENCOUNTER — Encounter: Payer: Self-pay | Admitting: Family

## 2018-04-05 ENCOUNTER — Ambulatory Visit (INDEPENDENT_AMBULATORY_CARE_PROVIDER_SITE_OTHER): Payer: PPO | Admitting: Family

## 2018-04-05 VITALS — BP 128/84 | HR 85 | Temp 98.6°F | Ht 65.0 in | Wt 196.1 lb

## 2018-04-05 DIAGNOSIS — R002 Palpitations: Secondary | ICD-10-CM

## 2018-04-05 DIAGNOSIS — R7303 Prediabetes: Secondary | ICD-10-CM | POA: Diagnosis not present

## 2018-04-05 DIAGNOSIS — R42 Dizziness and giddiness: Secondary | ICD-10-CM | POA: Diagnosis not present

## 2018-04-05 DIAGNOSIS — R29818 Other symptoms and signs involving the nervous system: Secondary | ICD-10-CM

## 2018-04-05 LAB — CBC WITH DIFFERENTIAL/PLATELET
Basophils Absolute: 0 10*3/uL (ref 0.0–0.1)
Basophils Relative: 0.8 % (ref 0.0–3.0)
Eosinophils Absolute: 0.2 10*3/uL (ref 0.0–0.7)
Eosinophils Relative: 2.7 % (ref 0.0–5.0)
HCT: 37 % (ref 36.0–46.0)
Hemoglobin: 12.5 g/dL (ref 12.0–15.0)
Lymphocytes Relative: 28.3 % (ref 12.0–46.0)
Lymphs Abs: 1.7 10*3/uL (ref 0.7–4.0)
MCHC: 33.9 g/dL (ref 30.0–36.0)
MCV: 98.5 fl (ref 78.0–100.0)
Monocytes Absolute: 0.6 10*3/uL (ref 0.1–1.0)
Monocytes Relative: 9.9 % (ref 3.0–12.0)
Neutro Abs: 3.5 10*3/uL (ref 1.4–7.7)
Neutrophils Relative %: 58.3 % (ref 43.0–77.0)
Platelets: 166 10*3/uL (ref 150.0–400.0)
RBC: 3.75 Mil/uL — AB (ref 3.87–5.11)
RDW: 16.7 % — ABNORMAL HIGH (ref 11.5–15.5)
WBC: 6 10*3/uL (ref 4.0–10.5)

## 2018-04-05 LAB — COMPREHENSIVE METABOLIC PANEL
ALBUMIN: 4.3 g/dL (ref 3.5–5.2)
ALT: 52 U/L — ABNORMAL HIGH (ref 0–35)
AST: 186 U/L — ABNORMAL HIGH (ref 0–37)
Alkaline Phosphatase: 129 U/L — ABNORMAL HIGH (ref 39–117)
BUN: 7 mg/dL (ref 6–23)
CO2: 25 mEq/L (ref 19–32)
Calcium: 9.4 mg/dL (ref 8.4–10.5)
Chloride: 97 mEq/L (ref 96–112)
Creatinine, Ser: 0.63 mg/dL (ref 0.40–1.20)
GFR: 97.33 mL/min (ref >60.00)
Glucose, Bld: 135 mg/dL — ABNORMAL HIGH (ref 70–99)
Potassium: 4.2 mEq/L (ref 3.5–5.1)
Sodium: 137 mEq/L (ref 135–145)
Total Bilirubin: 1.3 mg/dL — ABNORMAL HIGH (ref 0.2–1.2)
Total Protein: 7.9 g/dL (ref 6.0–8.3)

## 2018-04-05 LAB — HEMOGLOBIN A1C: Hgb A1c MFr Bld: 5.4 % (ref 4.6–6.5)

## 2018-04-05 MED ORDER — BLOOD GLUCOSE MONITOR KIT: PACK | 0 refills | Status: DC

## 2018-04-05 NOTE — Progress Notes (Signed)
[name] [name] is a 58 y.o. female with the following history as recorded in EpicCare:  Patient Active Problem List   Diagnosis Date Noted  . COPD (chronic obstructive pulmonary disease) ([place]) 11/08/2017  . Iron overload 09/22/2017  . Secondary hemochromatosis 09/22/2017  . Allergic rhinitis 09/05/2017  . Hemoptysis 05/26/2017  . Pulmonary nodule, right 11/29/2016  . Polyarthralgia 12/02/2015  . Varicose veins of both lower extremities 12/02/2015  . Hypertensive retinopathy of both eyes 11/24/2015  . Elevated liver enzymes 10/31/2015  . Hyperglycemia 10/31/2015  . SIRS (systemic inflammatory response syndrome) ([place]) 03/11/2013  . Hypertension   . Hyperlipidemia   . Anemia   . Hematochezia 08/14/2012  . ETOH abuse 08/14/2012  . Pseudocyst of pancreas 08/12/2012  . Acute pancreatitis 08/12/2012  . GERD (gastroesophageal reflux disease) 06/08/2012  . Rectal fissure 05/11/2011  . Rectal fistula 05/11/2011  . Hiatal hernia 04/28/2011  . Gastritis 04/28/2011  . Nausea 04/27/2011  . Acute epigastric pain 04/27/2011  . Obesity, Class III, BMI 40-49.9 (morbid obesity) ([place]) 04/27/2011  . Solitary rectal ulcer 04/12/2011  . Hemorrhoids 03/10/2011  . Ulcerative proctitis, nonspecific ([place]) 03/10/2011  . DIVERTICULOSIS, COLON 10/22/2009  . COLONIC POLYPS, HYPERPLASTIC, HX OF 10/22/2009  . INFECTIOUS DIARRHEA 09/27/2008  . ULCERATIVE PROCTITIS 09/27/2008  . NAUSEA AND VOMITING 09/27/2008  . NAUSEA ALONE 09/27/2008  . DIARRHEA 09/27/2008  . ABDOMINAL PAIN -GENERALIZED 09/27/2008  . DEPRESSION 09/12/2007  . HEMORRHOIDS 09/12/2007  . HIATAL HERNIA 09/12/2007  . ULCERATIVE COLITIS 09/12/2007  . RECTAL PAIN 09/12/2007  . BIPOLAR AFFECTIVE DISORDER 12/06/2006  . TOBACCO ABUSE 12/06/2006  . Esophageal reflux 12/06/2006  . Cough 12/06/2006    Current Outpatient Medications  Medication Sig Dispense Refill  . ANUCORT-HC 25 MG suppository INSERT ONE SUPPOSITORY RECTALLY TWICE A DAY FOR  7 DAYS 14 suppository 0  . ARIPiprazole (ABILIFY) 30 MG tablet     . atorvastatin (LIPITOR) 40 MG tablet Take 1 tablet (40 mg total) by mouth daily at 6 PM. take 1 tablet by mouth at bedtime 90 tablet 1  . benzonatate (TESSALON) 200 MG capsule TAKE ONE CAPSULE BY MOUTH EVERY 6 HOURS AS NEEDED FOR COUGH 20 capsule 0  . dexlansoprazole (DEXILANT) 60 MG capsule Take 1 capsule (60 mg total) by mouth daily. 30 capsule 6  . diclofenac sodium (VOLTAREN) 1 % GEL Apply 2 g topically 4 (four) times daily.     Marland Kitchen FLUoxetine (PROZAC) 20 MG capsule Take 20 mg by mouth 2 (two) times daily.     . fluticasone (FLONASE) 50 MCG/ACT nasal spray Place 2 sprays into both nostrils daily. 48 g 3  . HYDROcodone-acetaminophen (NORCO/VICODIN) 5-325 MG tablet Take 1 tablet by mouth 4 (four) times daily as needed for moderate pain.     Marland Kitchen lamoTRIgine (LAMICTAL) 100 MG tablet Take 100 mg by mouth 2 (two) times daily.     . methocarbamol (ROBAXIN) 500 MG tablet Take 500 mg by mouth 2 (two) times daily as needed for muscle spasms.     . Multiple Vitamin (MULTIVITAMIN WITH MINERALS) TABS tablet Take 1 tablet by mouth daily. 30 tablet 0  . NARCAN 4 MG/0.1ML LIQD nasal spray kit Place 0.4 mg into the nose once.     . prednisoLONE acetate (PRED FORTE) 1 % ophthalmic suspension     . promethazine (PHENERGAN) 25 MG suppository PLACE 1 SUPPOSITORY RECTALLY EVERY 6 HOURS AS NEEDED FOR NAUSEA OR VOMITING 30 suppository 0  . Tetrahydrozoline HCl (VISINE OP) Place 1-2 drops into  both eyes daily.    . Vitamin D, Ergocalciferol, (DRISDOL) 1.25 MG (50000 UT) CAPS capsule Take 1 capsule (50,000 Units total) by mouth every 7 (seven) days for 12 doses. 12 capsule 0  . albuterol (PROAIR HFA) 108 (90 Base) MCG/ACT inhaler Inhale 2 puffs into the lungs every 4 (four) hours as needed for wheezing or shortness of breath. (Patient not taking: Reported on 04/05/2018) 1 Inhaler 5  . blood glucose meter kit and supplies KIT Dispense based on patient and  insurance preference. Use up to four times daily as directed. (FOR ICD-9 250.00, 250.01). 1 each 0  . brimonidine (ALPHAGAN) 0.2 % ophthalmic solution     . ketorolac (ACULAR) 0.5 % ophthalmic solution     . ofloxacin (OCUFLOX) 0.3 % ophthalmic solution      No current facility-administered medications for this visit.     Allergies: Patient has no known allergies.  Past Medical History:  Diagnosis Date  . Anal fistula   . Anemia   . Anxiety   . Arthritis   . Bipolar disorder ([place])   . Chronic kidney disease   . Depressive disorder, not elsewhere classified   . Diverticulosis of colon (without mention of hemorrhage)   . Esophageal reflux   . GERD (gastroesophageal reflux disease)   . Hepatic steatosis   . Hiatal hernia   . History of recurrent UTIs   . Hyperlipidemia   . Hypertension   . Iron deficiency   . Obesity   . Pancreatitis   . Pancreatitis   . Personal history of colonic polyps 10/16/2009   hyperplastic  . Pre-diabetes   . Seizures ([place])   . Ulcerative colitis, unspecified     Past Surgical History:  Procedure Laterality Date  . FINGER AMPUTATION  2010   right index  . HEMORROIDECTOMY    . KNEE ARTHROSCOPY     bilateral, left x2  . TOTAL ABDOMINAL HYSTERECTOMY    . VIDEO BRONCHOSCOPY Bilateral 05/31/2017   Procedure: VIDEO BRONCHOSCOPY WITHOUT FLUORO;  Surgeon: Collene Gobble, MD;  Location: [facility];  Service: Cardiopulmonary;  Laterality: Bilateral;    Family History  Problem Relation Age of Onset  . Irritable bowel syndrome Mother   . Hypertension Mother   . Atrial fibrillation Mother   . Ovarian cancer Maternal Aunt   . Diabetes Father   . Heart attack Father   . Hypertension Father   . Hyperlipidemia Father   . Thyroid disease Sister   . Hyperlipidemia Sister   . Thyroid disease Brother   . Colon polyps Brother   . Stomach cancer Neg Hx   . Colon cancer Neg Hx     Social History   Tobacco Use  . Smoking status: Current Every Day Smoker     Packs/day: 0.25    Types: Cigars, Cigarettes  . Smokeless tobacco: Never Used  . Tobacco comment: pt states she smokes 3 cigarettes/ day. Trying to quit. Taking Chantix  Substance Use Topics  . Alcohol use: Yes    Alcohol/week: 1.0 standard drinks    Types: 1 Shots of liquor per week    Comment: occ    Subjective:  Patient notes she has been feeling "off recently." Notes she has been feeling weak/ dizzy; No chest pain or shortness of breath; Admits that depression has been more problematic recently- missing her mother and brother; having difficulty with daily activities- just no energy; is not suicidal; not taking her daily medications as prescribed; still smoking and using  alcohol;  Also requesting to have labs done for her upcoming hematology appointment in preparation for phlebotomy;     Objective:  Vitals:   04/05/18 1051  BP: 128/84  Pulse: 85  Temp: 98.6 F (37 C)  TempSrc: Oral  SpO2: 96%  Weight: 196 lb 1.9 oz (89 kg)  Height: '5\' 5"'$  (1.651 m)    General: Well developed, well nourished, in no acute distress  Skin : Warm and dry.  Head: Normocephalic and atraumatic  Eyes: Sclera and conjunctiva clear; pupils round and reactive to light; extraocular movements intact  Ears: External normal; canals clear; tympanic membranes normal  Oropharynx: Pink, supple. No suspicious lesions  Neck: Supple without thyromegaly, adenopathy  Lungs: Respirations unlabored; clear to auscultation bilaterally without wheeze, rales, rhonchi  CVS exam: normal rate and regular rhythm.  Neurologic: Alert and oriented; speech intact; face symmetrical; moves all extremities well; CNII-XII intact without focal deficit  Assessment:    1. Dizziness   2. Other symptoms and signs involving the nervous system   3. Pre-diabetes   4. Hemochromatosis, unspecified hemochromatosis type   5. Palpitations     Plan:  1. & 2.  Suspect multi-factorial; will update brain MRI and carotid dopplers;  encouraged to take medications as prescribed and see her psychiatrist as scheduled; follow-up to be determined; stressed need to quit smoking and drinking alcohol;  3. Check Hgba1c today; order for glucometer; 4. Check ferritin, ibc and iron per hematology request; keep planned follow-up for phlebotomy; 5. Due to [place] of [place] Fib, question need for echo/ holter monitor; EKG in 2019 was normal; per patient, symptoms present x years; refer to cardiology; s   No follow-ups on file.  Orders Placed This Encounter  Procedures  . MR Brain Wo Contrast    Standing Status:   Future    Standing Expiration Date:   06/04/2019    Order Specific Question:   What is the patient's sedation requirement?    Answer:   No Sedation    Order Specific Question:   Does the patient have a pacemaker or implanted devices?    Answer:   No    Order Specific Question:   Preferred imaging location?    Answer:   [facility] (table limit-550lbs)    Order Specific Question:   Radiology Contrast Protocol - do NOT remove file path    Answer:   [email or web address]  . US Carotid Duplex Bilateral    Standing Status:   Future    Standing Expiration Date:   06/04/2019    Scheduling Instructions:     Schedule with brain MRI please    Order Specific Question:   Reason for exam:    Answer:   dizziness    Order Specific Question:   Preferred imaging location?    Answer:   [facility] [address]  . CBC w/Diff    Standing Status:   Future    Number of Occurrences:   1    Standing Expiration Date:   04/05/2019  . Comp Met (CMET)    Standing Status:   Future    Number of Occurrences:   1    Standing Expiration Date:   04/05/2019  . Iron, TIBC and Ferritin Panel    Standing Status:   Future    Number of Occurrences:   1    Standing Expiration Date:   04/06/2019  . HgB A1c    Standing Status:   Future    Number of Occurrences:  1    Standing Expiration Date:   04/05/2019  . Ambulatory referral to  Cardiology    Referral Priority:   Routine    Referral Type:   Consultation    Referral Reason:   Specialty Services Required    Requested Specialty:   Cardiology    Number of Visits Requested:   1    Requested Prescriptions   Signed Prescriptions Disp Refills  . blood glucose meter kit and supplies KIT 1 each 0    Sig: Dispense based on patient and insurance preference. Use up to four times daily as directed. (FOR ICD-9 250.00, 250.01).

## 2018-04-06 LAB — IRON,TIBC AND FERRITIN PANEL
%SAT: 27 % (calc) (ref 16–45)
Ferritin: 72 ng/mL (ref 16–232)
Iron: 123 ug/dL (ref 45–160)
TIBC: 451 mcg/dL (calc) — ABNORMAL HIGH (ref 250–450)

## 2018-04-07 ENCOUNTER — Ambulatory Visit
Admission: RE | Admit: 2018-04-07 | Discharge: 2018-04-07 | Disposition: A | Discharge status: Home / Self Care | Payer: PPO | Source: Ambulatory Visit | Attending: Family | Admitting: Family

## 2018-04-07 DIAGNOSIS — R42 Dizziness and giddiness: Secondary | ICD-10-CM

## 2018-04-07 DIAGNOSIS — I6523 Occlusion and stenosis of bilateral carotid arteries: Secondary | ICD-10-CM | POA: Diagnosis not present

## 2018-04-09 ENCOUNTER — Ambulatory Visit
Admission: RE | Admit: 2018-04-09 | Discharge: 2018-04-09 | Disposition: A | Discharge status: Home / Self Care | Payer: PPO | Source: Ambulatory Visit | Attending: Family | Admitting: Family

## 2018-04-09 ENCOUNTER — Other Ambulatory Visit: Payer: Self-pay | Admitting: Internal Medicine

## 2018-04-09 DIAGNOSIS — R42 Dizziness and giddiness: Secondary | ICD-10-CM | POA: Diagnosis not present

## 2018-04-09 DIAGNOSIS — R29818 Other symptoms and signs involving the nervous system: Secondary | ICD-10-CM

## 2018-04-10 DIAGNOSIS — M179 Osteoarthritis of knee, unspecified: Secondary | ICD-10-CM | POA: Diagnosis not present

## 2018-04-10 DIAGNOSIS — G894 Chronic pain syndrome: Secondary | ICD-10-CM | POA: Diagnosis not present

## 2018-04-10 DIAGNOSIS — M7060 Trochanteric bursitis, unspecified hip: Secondary | ICD-10-CM | POA: Diagnosis not present

## 2018-04-10 DIAGNOSIS — M545 Low back pain: Secondary | ICD-10-CM | POA: Diagnosis not present

## 2018-04-10 DIAGNOSIS — M259 Joint disorder, unspecified: Secondary | ICD-10-CM | POA: Diagnosis not present

## 2018-04-10 DIAGNOSIS — M5136 Other intervertebral disc degeneration, lumbar region: Secondary | ICD-10-CM | POA: Diagnosis not present

## 2018-04-11 ENCOUNTER — Telehealth: Payer: Self-pay | Admitting: Family

## 2018-04-11 ENCOUNTER — Ambulatory Visit: Payer: PPO | Admitting: Emergency Medicine

## 2018-04-11 NOTE — Telephone Encounter (Signed)
Sent patient a message to let her know Mickel Baas was currently out of the office and would return tomorrow. She will be notified with results as soon as I get results from Mickel Baas.

## 2018-04-11 NOTE — Telephone Encounter (Signed)
Copied from [place] [id number]. Topic: Quick Communication - See Telephone Encounter >> Apr 11, 2018  9:49 AM Rutherford Nail, NT wrote: CRM for notification. See Telephone encounter for: 04/11/18. Patient calling to check on her Brain MRI results form 04/10/2018. Please advise. Would like a voicemail to be left if she does not answer.

## 2018-04-13 ENCOUNTER — Telehealth: Payer: Self-pay | Admitting: *Deleted

## 2018-04-13 ENCOUNTER — Other Ambulatory Visit: Payer: PPO

## 2018-04-13 NOTE — Telephone Encounter (Signed)
Patient called - had lab work completed at PCP 2/12. She asked that Dr. Irene Limbo review the results and advise if she needed to come in to have labs and see him on 2/21. She states she has many doctor's appts right now as she is trying to manage several health concerns and will come only if needed.  Dr. Irene Limbo reviewed results from PCP 2/12. Ferritin 72. Goal for patient is less than 100.  Per Dr. Irene Limbo, patient may cancel appt with him 2/21. Per Dr. Irene Limbo, patient could f/u with her PCP in 4-6 months for repeat labs if agreeable to patient and PCP. PCP or patient to contact this office or to send/cc Dr. Irene Limbo with results so that phlebotomy could be scheduled if needed.  Patient is agreeable as states she has appt with PCP in next month and will discuss possibility of PCP following ferritin level.  Encouraged patient to contact office for appt if needed and for further questions and concerns. Patient verbalized understanding.

## 2018-04-14 ENCOUNTER — Inpatient Hospital Stay: Payer: PPO | Admitting: Hematology

## 2018-04-18 ENCOUNTER — Ambulatory Visit: Payer: PPO | Admitting: Emergency Medicine

## 2018-04-19 ENCOUNTER — Ambulatory Visit: Payer: PPO | Admitting: Cardiology

## 2018-04-25 ENCOUNTER — Encounter: Payer: Self-pay | Admitting: Cardiology

## 2018-04-27 ENCOUNTER — Ambulatory Visit: Payer: Self-pay

## 2018-04-27 NOTE — Telephone Encounter (Signed)
Pt calling back for a status update. Please advise. Pt would like a call back as soon as possible.

## 2018-04-27 NOTE — Telephone Encounter (Signed)
Sent patient message via my-chart. She read message today that Mickel Baas sent her back on 04/12/18 that her MRI was normal.

## 2018-04-27 NOTE — Telephone Encounter (Signed)
Incoming  Call from  Patient with  Complaint  of " being fuzzy headed." Patient  cant quite explain it.  States  That  Her  Blood sugar is 158.  Onset  was  this  Morning.States  That  It  Comes and Goes. Denies  Alcohol and drug use today.  Patient Reports  Memories.  Offer Patient an appointment.  Patient  States that she wishes to wait a little while will call back to schedule.  Reason for Disposition . [1] Longstanding confusion (e.g., dementia, stroke) AND [2] NO worsening or change  Answer Assessment - Initial Assessment Questions 1. LEVEL OF CONSCIOUSNESS: "How is he (she, the patient) acting right now?" (e.g., alert-oriented, confused, lethargic, stuporous, comatose)     Confused fuzzy headed feell faint  2. ONSET: "When did the confusion start?"  (minutes, hours, days)     This  morning 3. PATTERN "Does this come and go, or has it been constant since it started?"  "Is it present now?"     Yes   4. ALCOHOL or DRUGS: "Has he been drinking alcohol or taking any drugs?"      denies 5. NARCOTIC MEDICATIONS: "Has he been receiving any narcotic medications?" (e.g., morphine, Vicodin)     denies 6. CAUSE: "What do you think is causing the confusion?"      no 7. OTHER SYMPTOMS: "Are there any other symptoms?" (e.g., difficulty breathing, headache, fever, weakness)     Memory issues  Protocols used: CONFUSION - DELIRIUM-A-AH

## 2018-04-28 ENCOUNTER — Other Ambulatory Visit: Payer: Self-pay | Admitting: Family

## 2018-05-01 ENCOUNTER — Other Ambulatory Visit: Payer: Self-pay | Admitting: Family

## 2018-05-02 DIAGNOSIS — F319 Bipolar disorder, unspecified: Secondary | ICD-10-CM | POA: Diagnosis not present

## 2018-05-08 ENCOUNTER — Telehealth: Payer: Self-pay | Admitting: Internal Medicine

## 2018-05-08 MED ORDER — HYDROCORTISONE ACETATE 25 MG RE SUPP: 25.0000 mg | Freq: Every day | RECTAL | 0 refills | Status: DC

## 2018-05-08 MED ORDER — HYDROCORTISONE ACE-PRAMOXINE 1-1 % RE CREA: 1.0000 "application " | TOPICAL_CREAM | Freq: Two times a day (BID) | RECTAL | 0 refills | Status: DC

## 2018-05-08 NOTE — Telephone Encounter (Signed)
No colitis on most recent colonoscopy.  She did have both internal and external hemorrhoids.  Treat with Metamucil 2 tablespoons daily and Anusol HC suppositories at night for 2 weeks.  If symptoms persist or worsen she will need to be seen

## 2018-05-08 NOTE — Telephone Encounter (Signed)
Pt states 3 days ago she started having some constipation and LLQ pain. She started passing BRB in her stool along with darker red clots. She reports her hemorrhoids are not "poked out", she has had fissures in the past but she has no rectal pain. Her stools are looser now and the LLQ pain is better so she feels that was related to constipation. Pt is calling to see if she can get some medication called in for her, she is trying not to come out if possible. Please advise.

## 2018-05-08 NOTE — Telephone Encounter (Signed)
Pt has UC and hemorrhoids and reported blood clots in stool.  Please advise.

## 2018-05-08 NOTE — Telephone Encounter (Signed)
Spoke with pt and she is aware. Script sent to pharmacy. 

## 2018-05-08 NOTE — Telephone Encounter (Signed)
Pts insurance would not cover Anusol suppositories. Script for cream sent to pharmacy.

## 2018-05-08 NOTE — Telephone Encounter (Signed)
Patient said her insurance is not coving hydrocortisone (ANUSOL-HC) 25 MG suppository  And would like to know if she can take something else she has some suggestion.

## 2018-05-09 DIAGNOSIS — M7060 Trochanteric bursitis, unspecified hip: Secondary | ICD-10-CM | POA: Diagnosis not present

## 2018-05-09 DIAGNOSIS — G894 Chronic pain syndrome: Secondary | ICD-10-CM | POA: Diagnosis not present

## 2018-05-09 DIAGNOSIS — Z5181 Encounter for therapeutic drug level monitoring: Secondary | ICD-10-CM | POA: Diagnosis not present

## 2018-05-09 DIAGNOSIS — M5136 Other intervertebral disc degeneration, lumbar region: Secondary | ICD-10-CM | POA: Diagnosis not present

## 2018-05-10 ENCOUNTER — Ambulatory Visit: Payer: PPO | Admitting: Cardiology

## 2018-05-11 ENCOUNTER — Other Ambulatory Visit: Payer: Self-pay | Admitting: Internal Medicine

## 2018-05-12 ENCOUNTER — Telehealth: Payer: Self-pay | Admitting: Internal Medicine

## 2018-05-12 MED ORDER — PROMETHAZINE HCL 25 MG RE SUPP: RECTAL | 3 refills | Status: DC

## 2018-05-12 NOTE — Telephone Encounter (Signed)
Pi is requesting rf of premethazine sent to [facility] on [address].

## 2018-05-12 NOTE — Telephone Encounter (Signed)
Refilled phenergan

## 2018-05-15 DIAGNOSIS — K7030 Alcoholic cirrhosis of liver without ascites: Secondary | ICD-10-CM | POA: Diagnosis not present

## 2018-05-23 ENCOUNTER — Other Ambulatory Visit: Payer: Self-pay | Admitting: Family

## 2018-05-25 ENCOUNTER — Telehealth: Payer: Self-pay | Admitting: Internal Medicine

## 2018-05-25 NOTE — Telephone Encounter (Signed)
She is concerned about possible colitis flare. Prescribe rowasa enemas (or equivalent) one at night; #30; 1 refill

## 2018-05-25 NOTE — Telephone Encounter (Signed)
Pt stated that she has internal and external hemorrhoids and would like to be prescribed an enema.  She is OK with the price.

## 2018-05-25 NOTE — Telephone Encounter (Signed)
Patient states she has used a enema in the past that helps with bleeding. Her suppositories she has used for several weeks with no relief of bleeding. Pt would like a enema sent to pharmacy. Please advise Dr. [name].

## 2018-05-26 MED ORDER — MESALAMINE 4 G RE ENEM: 4.0000 g | ENEMA | Freq: Every day | RECTAL | 1 refills | Status: DC

## 2018-05-26 NOTE — Telephone Encounter (Signed)
[place] and it kept sending me back mail automated message. Waited for 10 minutes and hung up. Called patient to inform her that I tried to contact pharmacy to clarify prescription but could not speak with anyone. Patient states she had same issue but they told her the prescription states kits and they are confused. Informed patient the prescription does not state kits at all but I will resend the prescription again.

## 2018-05-26 NOTE — Telephone Encounter (Signed)
Mrs. Vanderloop called to say they pharmacy faxed [place] something about how we sent in her medications incorrectly. They are unsure how to fill it and need clarifications before giving her the medications.

## 2018-05-26 NOTE — Telephone Encounter (Signed)
Prescription sent to patient's pharmacy and patient notified.  

## 2018-05-26 NOTE — Addendum Note (Signed)
Addended by: Marzella Schlein on: 05/26/2018 10:59 AM   Modules accepted: Orders

## 2018-06-13 DIAGNOSIS — M7060 Trochanteric bursitis, unspecified hip: Secondary | ICD-10-CM | POA: Diagnosis not present

## 2018-06-13 DIAGNOSIS — Z5181 Encounter for therapeutic drug level monitoring: Secondary | ICD-10-CM | POA: Diagnosis not present

## 2018-06-13 DIAGNOSIS — G894 Chronic pain syndrome: Secondary | ICD-10-CM | POA: Diagnosis not present

## 2018-06-13 DIAGNOSIS — M5136 Other intervertebral disc degeneration, lumbar region: Secondary | ICD-10-CM | POA: Diagnosis not present

## 2018-06-19 ENCOUNTER — Other Ambulatory Visit: Payer: Self-pay | Admitting: Family

## 2018-06-30 ENCOUNTER — Telehealth: Payer: Self-pay | Admitting: Family

## 2018-06-30 NOTE — Telephone Encounter (Signed)
Copied from [place] [id number]. Topic: Quick Communication - See Telephone Encounter >> Jun 30, 2018 10:23 AM Blase Mess A wrote: CRM for notification. See Telephone encounter for: 06/30/18.  Patient is calling she feels fine.  However, does Mickel Baas think that the patient is in need of lab work for high iron. Please advise thank you CB-

## 2018-06-30 NOTE — Telephone Encounter (Signed)
Please have her call her hematologist regarding this question. They are the ones managing the condition for her.

## 2018-06-30 NOTE — Telephone Encounter (Signed)
Message sent to patient regarding advice.

## 2018-07-04 ENCOUNTER — Ambulatory Visit: Payer: PPO | Admitting: Interventional Cardiology

## 2018-07-11 ENCOUNTER — Other Ambulatory Visit: Payer: Self-pay | Admitting: Nurse Practitioner

## 2018-07-11 DIAGNOSIS — K7469 Other cirrhosis of liver: Secondary | ICD-10-CM

## 2018-07-12 ENCOUNTER — Other Ambulatory Visit: Payer: Self-pay | Admitting: Family

## 2018-07-14 ENCOUNTER — Telehealth: Payer: Self-pay | Admitting: *Deleted

## 2018-07-14 NOTE — Telephone Encounter (Signed)
Called and stated she had not been seen in a while and thought she needed to have her iron level checked.  Patient has not been seen by Dr. Irene Limbo since 09/14/17. Per Dr. Irene Limbo, she needs to have an appointment to see him and have also have lab work done. Contacted patient and informed her that scheduling will contact her to set up both appts. She verbalized understanding. Schedule message sent.

## 2018-07-20 ENCOUNTER — Telehealth: Payer: Self-pay | Admitting: Hematology

## 2018-07-20 DIAGNOSIS — Z5181 Encounter for therapeutic drug level monitoring: Secondary | ICD-10-CM | POA: Diagnosis not present

## 2018-07-20 DIAGNOSIS — G894 Chronic pain syndrome: Secondary | ICD-10-CM | POA: Diagnosis not present

## 2018-07-20 DIAGNOSIS — M5136 Other intervertebral disc degeneration, lumbar region: Secondary | ICD-10-CM | POA: Diagnosis not present

## 2018-07-20 DIAGNOSIS — M7060 Trochanteric bursitis, unspecified hip: Secondary | ICD-10-CM | POA: Diagnosis not present

## 2018-07-20 NOTE — Telephone Encounter (Signed)
Spoke with patient re lab/fu 6/18.

## 2018-07-21 ENCOUNTER — Other Ambulatory Visit: Payer: PPO

## 2018-07-24 ENCOUNTER — Other Ambulatory Visit: Payer: PPO

## 2018-08-10 ENCOUNTER — Inpatient Hospital Stay: Payer: PPO

## 2018-08-10 ENCOUNTER — Telehealth: Payer: Self-pay | Admitting: *Deleted

## 2018-08-10 ENCOUNTER — Inpatient Hospital Stay: Payer: PPO | Admitting: Hematology

## 2018-08-10 NOTE — Telephone Encounter (Signed)
Notified by [facility] screener that patient wanted to cancel/reschedule appointments for today. Dr.Kale informed. Dr.Kale asked that she be advised of the following: Dr. Irene Limbo recommends that she does not need to reschedule with Dr.Kale at this time and that she f/u with her PCP and they can re-consult him as needed.   Contacted patient with information and direction from Dr.Kale. Appointments for 6/18 will be cancelled. Patient verbalized understanding.

## 2018-08-14 ENCOUNTER — Telehealth: Payer: Self-pay

## 2018-08-14 NOTE — Telephone Encounter (Signed)
Yes please; Please find out the last time she has seen her liver specialist as well- suspect that's what she is concerned about with follow-up.

## 2018-08-14 NOTE — Telephone Encounter (Signed)
Sent patient a Clinical biochemist. Waiting to hear back from her.

## 2018-08-14 NOTE — Telephone Encounter (Signed)
Copied from [place] [phone]. Topic: General - Other >> Aug 14, 2018  9:13 AM Parke Poisson wrote: Reason for CRM:Pt wants to get blood work done this week and before appointment with you

## 2018-08-16 DIAGNOSIS — M7060 Trochanteric bursitis, unspecified hip: Secondary | ICD-10-CM | POA: Diagnosis not present

## 2018-08-16 DIAGNOSIS — M5136 Other intervertebral disc degeneration, lumbar region: Secondary | ICD-10-CM | POA: Diagnosis not present

## 2018-08-16 DIAGNOSIS — G894 Chronic pain syndrome: Secondary | ICD-10-CM | POA: Diagnosis not present

## 2018-08-16 DIAGNOSIS — Z5181 Encounter for therapeutic drug level monitoring: Secondary | ICD-10-CM | POA: Diagnosis not present

## 2018-08-17 ENCOUNTER — Other Ambulatory Visit: Payer: Self-pay | Admitting: Family

## 2018-08-21 ENCOUNTER — Telehealth: Payer: Self-pay

## 2018-08-21 NOTE — Telephone Encounter (Signed)
Called patient and also sent my-chart message to let me know what labs she was inquiring about to see if it was appropriate for Mickel Baas to order of if she needed to speak with kidney specialist.

## 2018-08-23 ENCOUNTER — Other Ambulatory Visit: Payer: PPO

## 2018-08-29 ENCOUNTER — Ambulatory Visit: Payer: PPO | Admitting: Family

## 2018-08-29 NOTE — Telephone Encounter (Signed)
Message sent to patient to let me know about labs. Was suppose to come this week but patient has moved appointment to next week.

## 2018-08-31 ENCOUNTER — Telehealth: Payer: Self-pay

## 2018-08-31 NOTE — Telephone Encounter (Signed)
Spoke with patient today. She was requesting for Mickel Baas to do lab work for her so she could discuss labs with her. I explained to patient she would need to come in for appointment first to be evaluated to determine what labs she would need. Patient was on for 7/17 and I moved her up to 7/14. She noted she has intermediate dizziness but thinks it was coming from her psych meds. She said she would keep an eye on it or be seen if it got worse.

## 2018-09-04 ENCOUNTER — Ambulatory Visit
Admission: RE | Admit: 2018-09-04 | Discharge: 2018-09-04 | Disposition: A | Discharge status: Home / Self Care | Payer: PPO | Source: Ambulatory Visit | Attending: Nurse Practitioner | Admitting: Nurse Practitioner

## 2018-09-04 DIAGNOSIS — R188 Other ascites: Secondary | ICD-10-CM | POA: Diagnosis not present

## 2018-09-04 DIAGNOSIS — K7469 Other cirrhosis of liver: Secondary | ICD-10-CM

## 2018-09-04 DIAGNOSIS — R932 Abnormal findings on diagnostic imaging of liver and biliary tract: Secondary | ICD-10-CM | POA: Diagnosis not present

## 2018-09-05 ENCOUNTER — Other Ambulatory Visit: Payer: Self-pay

## 2018-09-05 ENCOUNTER — Other Ambulatory Visit (INDEPENDENT_AMBULATORY_CARE_PROVIDER_SITE_OTHER): Payer: PPO

## 2018-09-05 ENCOUNTER — Encounter: Payer: Self-pay | Admitting: Family

## 2018-09-05 ENCOUNTER — Ambulatory Visit (INDEPENDENT_AMBULATORY_CARE_PROVIDER_SITE_OTHER): Payer: PPO | Admitting: Family

## 2018-09-05 VITALS — BP 130/76 | HR 80 | Temp 98.9°F | Ht 65.0 in | Wt 190.2 lb

## 2018-09-05 DIAGNOSIS — E538 Deficiency of other specified B group vitamins: Secondary | ICD-10-CM

## 2018-09-05 DIAGNOSIS — R5383 Other fatigue: Secondary | ICD-10-CM | POA: Diagnosis not present

## 2018-09-05 DIAGNOSIS — R1084 Generalized abdominal pain: Secondary | ICD-10-CM

## 2018-09-05 DIAGNOSIS — R195 Other fecal abnormalities: Secondary | ICD-10-CM | POA: Diagnosis not present

## 2018-09-05 DIAGNOSIS — R7303 Prediabetes: Secondary | ICD-10-CM

## 2018-09-05 DIAGNOSIS — R7989 Other specified abnormal findings of blood chemistry: Secondary | ICD-10-CM

## 2018-09-05 DIAGNOSIS — F1010 Alcohol abuse, uncomplicated: Secondary | ICD-10-CM | POA: Diagnosis not present

## 2018-09-05 LAB — TSH: TSH: 1.64 u[IU]/mL (ref 0.35–4.50)

## 2018-09-05 LAB — COMPREHENSIVE METABOLIC PANEL
ALT: 48 U/L — ABNORMAL HIGH (ref 0–35)
AST: 158 U/L — ABNORMAL HIGH (ref 0–37)
Albumin: 4.3 g/dL (ref 3.5–5.2)
Alkaline Phosphatase: 140 U/L — ABNORMAL HIGH (ref 39–117)
BUN: 8 mg/dL (ref 6–23)
CO2: 27 mEq/L (ref 19–32)
Calcium: 9.8 mg/dL (ref 8.4–10.5)
Chloride: 95 mEq/L — ABNORMAL LOW (ref 96–112)
Creatinine, Ser: 0.6 mg/dL (ref 0.40–1.20)
GFR: 102.82 mL/min (ref >60.00)
Glucose, Bld: 136 mg/dL — ABNORMAL HIGH (ref 70–99)
Potassium: 4 mEq/L (ref 3.5–5.1)
Sodium: 134 mEq/L — ABNORMAL LOW (ref 135–145)
Total Bilirubin: 1.4 mg/dL — ABNORMAL HIGH (ref 0.2–1.2)
Total Protein: 8.1 g/dL (ref 6.0–8.3)

## 2018-09-05 LAB — CBC WITH DIFFERENTIAL/PLATELET
Basophils Absolute: 0 10*3/uL (ref 0.0–0.1)
Basophils Relative: 0.8 % (ref 0.0–3.0)
Eosinophils Absolute: 0.2 10*3/uL (ref 0.0–0.7)
Eosinophils Relative: 3.2 % (ref 0.0–5.0)
HCT: 35.7 % — ABNORMAL LOW (ref 36.0–46.0)
Hemoglobin: 12.1 g/dL (ref 12.0–15.0)
Lymphocytes Relative: 32.4 % (ref 12.0–46.0)
Lymphs Abs: 1.5 10*3/uL (ref 0.7–4.0)
MCHC: 34 g/dL (ref 30.0–36.0)
MCV: 103.7 fl — ABNORMAL HIGH (ref 78.0–100.0)
Monocytes Absolute: 0.5 10*3/uL (ref 0.1–1.0)
Monocytes Relative: 11.2 % (ref 3.0–12.0)
Neutro Abs: 2.5 10*3/uL (ref 1.4–7.7)
Neutrophils Relative %: 52.4 % (ref 43.0–77.0)
Platelets: 128 10*3/uL — ABNORMAL LOW (ref 150.0–400.0)
RBC: 3.44 Mil/uL — ABNORMAL LOW (ref 3.87–5.11)
RDW: 13.5 % (ref 11.5–15.5)
WBC: 4.7 10*3/uL (ref 4.0–10.5)

## 2018-09-05 LAB — AMYLASE: Amylase: 27 U/L (ref 27–131)

## 2018-09-05 LAB — VITAMIN B12: Vitamin B-12: 443 pg/mL (ref 211–911)

## 2018-09-05 LAB — HEMOGLOBIN A1C: Hgb A1c MFr Bld: 5.2 % (ref 4.6–6.5)

## 2018-09-05 LAB — LIPASE: Lipase: 13 U/L (ref 11.0–59.0)

## 2018-09-05 NOTE — Progress Notes (Signed)
[name] [name] is a 58 y.o. female with the following history as recorded in EpicCare:  Patient Active Problem List   Diagnosis Date Noted  . COPD (chronic obstructive pulmonary disease) ([place]) 11/08/2017  . Iron overload 09/22/2017  . Secondary hemochromatosis 09/22/2017  . Allergic rhinitis 09/05/2017  . Hemoptysis 05/26/2017  . Pulmonary nodule, right 11/29/2016  . Polyarthralgia 12/02/2015  . Varicose veins of both lower extremities 12/02/2015  . Hypertensive retinopathy of both eyes 11/24/2015  . Elevated liver enzymes 10/31/2015  . Hyperglycemia 10/31/2015  . SIRS (systemic inflammatory response syndrome) ([place]) 03/11/2013  . Hypertension   . Hyperlipidemia   . Anemia   . Hematochezia 08/14/2012  . ETOH abuse 08/14/2012  . Pseudocyst of pancreas 08/12/2012  . Acute pancreatitis 08/12/2012  . GERD (gastroesophageal reflux disease) 06/08/2012  . Rectal fissure 05/11/2011  . Rectal fistula 05/11/2011  . Hiatal hernia 04/28/2011  . Gastritis 04/28/2011  . Nausea 04/27/2011  . Acute epigastric pain 04/27/2011  . Obesity, Class III, BMI 40-49.9 (morbid obesity) ([place]) 04/27/2011  . Solitary rectal ulcer 04/12/2011  . Hemorrhoids 03/10/2011  . Ulcerative proctitis, nonspecific ([place]) 03/10/2011  . DIVERTICULOSIS, COLON 10/22/2009  . COLONIC POLYPS, HYPERPLASTIC, HX OF 10/22/2009  . INFECTIOUS DIARRHEA 09/27/2008  . ULCERATIVE PROCTITIS 09/27/2008  . NAUSEA AND VOMITING 09/27/2008  . NAUSEA ALONE 09/27/2008  . DIARRHEA 09/27/2008  . ABDOMINAL PAIN -GENERALIZED 09/27/2008  . DEPRESSION 09/12/2007  . HEMORRHOIDS 09/12/2007  . HIATAL HERNIA 09/12/2007  . ULCERATIVE COLITIS 09/12/2007  . RECTAL PAIN 09/12/2007  . BIPOLAR AFFECTIVE DISORDER 12/06/2006  . TOBACCO ABUSE 12/06/2006  . Esophageal reflux 12/06/2006  . Cough 12/06/2006    Current Outpatient Medications  Medication Sig Dispense Refill  . ANUCORT-HC 25 MG suppository INSERT ONE SUPPOSITORY RECTALLY TWICE A DAY FOR  7 DAYS 14 suppository 0  . ARIPiprazole (ABILIFY) 30 MG tablet     . atorvastatin (LIPITOR) 40 MG tablet Take 1 tablet (40 mg total) by mouth daily at 6 PM. take 1 tablet by mouth at bedtime 90 tablet 1  . benzonatate (TESSALON) 200 MG capsule TAKE ONE CAPSULE BY MOUTH EVERY 6 HOURS AS NEEDED FOR COUGH 20 capsule 0  . blood glucose meter kit and supplies KIT Dispense based on patient and insurance preference. Use up to four times daily as directed. (FOR ICD-9 250.00, 250.01). 1 each 0  . brimonidine (ALPHAGAN) 0.2 % ophthalmic solution     . DEXILANT 60 MG capsule TAKE ONE CAPSULE BY MOUTH DAILY 30 capsule 5  . diclofenac sodium (VOLTAREN) 1 % GEL Apply 2 g topically 4 (four) times daily.     Marland Kitchen FLUoxetine (PROZAC) 20 MG capsule Take 20 mg by mouth 2 (two) times daily.     . fluticasone (FLONASE) 50 MCG/ACT nasal spray Place 2 sprays into both nostrils daily. 48 g 3  . glucose blood (ONE TOUCH ULTRA TEST) test strip     . HYDROcodone-acetaminophen (NORCO/VICODIN) 5-325 MG tablet Take 1 tablet by mouth 4 (four) times daily as needed for moderate pain.     . hydrocortisone (ANUSOL-HC) 25 MG suppository Place 1 suppository (25 mg total) rectally at bedtime. 14 suppository 0  . ketorolac (ACULAR) 0.5 % ophthalmic solution     . lamoTRIgine (LAMICTAL) 100 MG tablet Take 100 mg by mouth 2 (two) times daily.     . Lancets (ONETOUCH DELICA PLUS ENIDPO24M) MISC USE TO TEST BLOOD SUGAR UP TO 4 TIMES DAILY AS DIRECTED 100 each 0  . mesalamine (  ROWASA) 4 g enema Place 60 mLs (4 g total) rectally at bedtime. 30 Bottle 1  . methocarbamol (ROBAXIN) 500 MG tablet Take 500 mg by mouth 2 (two) times daily as needed for muscle spasms.     . Multiple Vitamin (MULTIVITAMIN WITH MINERALS) TABS tablet Take 1 tablet by mouth daily. 30 tablet 0  . NARCAN 4 MG/0.1ML LIQD nasal spray kit Place 0.4 mg into the nose once.     Marland Kitchen ofloxacin (OCUFLOX) 0.3 % ophthalmic solution     . pramoxine-hydrocortisone (PROCTOCREAM-HC) 1-1  % rectal cream Place 1 application rectally 2 (two) times daily. 30 g 0  . prednisoLONE acetate (PRED FORTE) 1 % ophthalmic suspension     . promethazine (PHENERGAN) 25 MG suppository INSERT ONE SUPPOSITORY RECTALLY EVERY 6 HOURS AS NEEDED FOR NAUSEA AND VOMITING 30 suppository 3  . Tetrahydrozoline HCl (VISINE OP) Place 1-2 drops into both eyes daily.    . traMADol (ULTRAM) 50 MG tablet     . Vitamin D, Ergocalciferol, (DRISDOL) 1.25 MG (50000 UT) CAPS capsule TAKE ONE CAPSULE BY MOUTH EVERY 7 DAYS FOR 12 DOSES. 12 capsule 0  . albuterol (PROAIR HFA) 108 (90 Base) MCG/ACT inhaler Inhale 2 puffs into the lungs every 4 (four) hours as needed for wheezing or shortness of breath. (Patient not taking: Reported on 04/05/2018) 1 Inhaler 5  . Mesalamine-Cleanser 4 g KIT      No current facility-administered medications for this visit.     Allergies: Patient has no known allergies.  Past Medical History:  Diagnosis Date  . Anal fistula   . Anemia   . Anxiety   . Arthritis   . Bipolar disorder ([place])   . Chronic kidney disease   . Depressive disorder, not elsewhere classified   . Diverticulosis of colon (without mention of hemorrhage)   . Esophageal reflux   . GERD (gastroesophageal reflux disease)   . Hepatic steatosis   . Hiatal hernia   . History of recurrent UTIs   . Hyperlipidemia   . Hypertension   . Iron deficiency   . Obesity   . Pancreatitis   . Pancreatitis   . Personal history of colonic polyps 10/16/2009   hyperplastic  . Pre-diabetes   . Seizures ([place])   . Ulcerative colitis, unspecified     Past Surgical History:  Procedure Laterality Date  . FINGER AMPUTATION  2010   right index  . HEMORROIDECTOMY    . KNEE ARTHROSCOPY     bilateral, left x2  . TOTAL ABDOMINAL HYSTERECTOMY    . VIDEO BRONCHOSCOPY Bilateral 05/31/2017   Procedure: VIDEO BRONCHOSCOPY WITHOUT FLUORO;  Surgeon: Collene Gobble, MD;  Location: [facility];  Service: Cardiopulmonary;  Laterality:  Bilateral;    Family History  Problem Relation Age of Onset  . Irritable bowel syndrome Mother   . Hypertension Mother   . Atrial fibrillation Mother   . Ovarian cancer Maternal Aunt   . Diabetes Father   . Heart attack Father   . Hypertension Father   . Hyperlipidemia Father   . Thyroid disease Sister   . Hyperlipidemia Sister   . Thyroid disease Brother   . Colon polyps Brother   . Stomach cancer Neg Hx   . Colon cancer Neg Hx     Social History   Tobacco Use  . Smoking status: Current Every Day Smoker    Packs/day: 0.25    Types: Cigars, Cigarettes  . Smokeless tobacco: Never Used  . Tobacco comment: pt  states she smokes 3 cigarettes/ day. Trying to quit. Taking Chantix  Substance Use Topics  . Alcohol use: Yes    Alcohol/week: 1.0 standard drinks    Types: 1 Shots of liquor per week    Comment: occ    Subjective:  Patient presents with concerns for "dark colored" stool for the past 2-3 weeks; has also been having increased problems with vomiting in the past 2-3 weeks- increased mucus but denies any dark, tarry, sticky, coffee grounds emesis; Has lost 6 pounds since last OV in February 2020; has been taking her Dexilant regularly;  Has not see her hematologist since earlier this year- he asked that we update labs and let her know if she needs to schedule a follow-up with him; Also admits that depression is not as well controlled- denies being suicidal and does plan to talk to her psychiatrist later this month about changing her medications.      Objective:  Vitals:   09/05/18 1002  BP: 130/76  Pulse: 80  Temp: 98.9 F (37.2 C)  TempSrc: Oral  SpO2: 95%  Weight: 190 lb 3.2 oz (86.3 kg)  Height: _0  (1.651 m)    General: Well developed, well nourished, in no acute distress  Skin : Warm and dry.  Head: Normocephalic and atraumatic  Eyes: Sclera and conjunctiva clear; pupils round and reactive to light; extraocular movements intact  Ears: External normal;  canals clear; tympanic membranes normal  Neck: Supple without thyromegaly, adenopathy  Lungs: Respirations unlabored; clear to auscultation bilaterally without wheeze, rales, rhonchi  CVS exam: normal rate and regular rhythm.  Abdomen: Soft; nontender; nondistended; normoactive bowel sounds; no masses or hepatosplenomegaly  Musculoskeletal: No deformities; no active joint inflammation  Extremities: No edema, cyanosis, clubbing  Vessels: Symmetric bilaterally  Neurologic: Alert and oriented; speech intact; face symmetrical; moves all extremities well; CNII-XII intact without focal deficit   Assessment:  1. Dark stools   2. ABDOMINAL PAIN -GENERALIZED   3. Pre-diabetes   4. Low vitamin B12 level   5. ETOH abuse   6. Other fatigue   7. Hemochromatosis, unspecified hemochromatosis type     Plan:  1. Update labs today; urgent referral back to her GI; may also need to consider updating CT; just had abdominal ultrasound- known cirrhosis; follow-up to be determined based on lab results.  2. Check amylase, lipase; encouraged to limit alcohol use; 3. Check Hgba1c today; 4. Check B12 level; 5. Check B1 level; 6. Check TSH;  7. Per patient, hematologist asked her to schedule a follow-up there only if labs done here indicated a need for follow-up.  No follow-ups on file.  Orders Placed This Encounter  Procedures  . CBC w/Diff    Standing Status:   Future    Number of Occurrences:   1    Standing Expiration Date:   09/05/2019  . Iron, TIBC and Ferritin Panel    Standing Status:   Future    Number of Occurrences:   1    Standing Expiration Date:   09/05/2019  . Comp Met (CMET)    Standing Status:   Future    Number of Occurrences:   1    Standing Expiration Date:   09/05/2019  . HgB A1c    Standing Status:   Future    Number of Occurrences:   1    Standing Expiration Date:   09/05/2019  . Amylase    Standing Status:   Future    Number of Occurrences:  1    Standing Expiration Date:    09/05/2019  . Lipase    Standing Status:   Future    Number of Occurrences:   1    Standing Expiration Date:   09/05/2019  . B12    Standing Status:   Future    Number of Occurrences:   1    Standing Expiration Date:   09/05/2019  . Vitamin B1  . TSH    Standing Status:   Future    Number of Occurrences:   1    Standing Expiration Date:   09/05/2019  . Ambulatory referral to Gastroenterology    Referral Priority:   Urgent    Referral Type:   Consultation    Referral Reason:   Specialty Services Required    Referred to Provider:   Irene Shipper, MD    Number of Visits Requested:   1    Requested Prescriptions    No prescriptions requested or ordered in this encounter

## 2018-09-06 LAB — IRON,TIBC AND FERRITIN PANEL
%SAT: 36 % (calc) (ref 16–45)
Ferritin: 77 ng/mL (ref 16–232)
Iron: 168 ug/dL — ABNORMAL HIGH (ref 45–160)
TIBC: 461 mcg/dL (calc) — ABNORMAL HIGH (ref 250–450)

## 2018-09-08 ENCOUNTER — Ambulatory Visit: Payer: PPO | Admitting: Family

## 2018-09-10 ENCOUNTER — Encounter: Payer: Self-pay | Admitting: Family

## 2018-09-12 ENCOUNTER — Ambulatory Visit: Payer: PPO | Admitting: Family

## 2018-09-13 DIAGNOSIS — G894 Chronic pain syndrome: Secondary | ICD-10-CM | POA: Diagnosis not present

## 2018-09-13 DIAGNOSIS — Z5181 Encounter for therapeutic drug level monitoring: Secondary | ICD-10-CM | POA: Diagnosis not present

## 2018-09-13 DIAGNOSIS — M7060 Trochanteric bursitis, unspecified hip: Secondary | ICD-10-CM | POA: Diagnosis not present

## 2018-09-13 DIAGNOSIS — M5136 Other intervertebral disc degeneration, lumbar region: Secondary | ICD-10-CM | POA: Diagnosis not present

## 2018-09-18 ENCOUNTER — Other Ambulatory Visit: Payer: Self-pay | Admitting: Family

## 2018-09-20 DIAGNOSIS — F319 Bipolar disorder, unspecified: Secondary | ICD-10-CM | POA: Diagnosis not present

## 2018-09-27 ENCOUNTER — Telehealth: Payer: Self-pay | Admitting: Family

## 2018-09-27 ENCOUNTER — Telehealth: Payer: Self-pay | Admitting: Internal Medicine

## 2018-09-27 ENCOUNTER — Ambulatory Visit: Payer: Self-pay | Admitting: Family

## 2018-09-27 NOTE — Telephone Encounter (Signed)
  Pt called in concerned about a place under her left arm that is red, swollen, hard and has a purple ring around it.   She noticed it yesterday.    She mentioned she has had 3 tick bites recently and removed them but she did not remove a tick from under her left arm so not sure what bit her there. She c/o sleeping a lot lately.    I warm transferred her call into [name], FNP's office to [place] to be scheduled.   Reason for Disposition . Red ring or bull's-eye rash occurs at tick bite  Answer Assessment - Initial Assessment Questions 1. TYPE of TICK: "Is it a wood tick or a deer tick?" If unsure, ask: "What size was the tick?" "Did it look more like a watermelon seed or a poppy seed?"      Over the last 3 weeks I've had 3 tick bites.   I pulled them off.    [place]   2. LOCATION: "Where is the tick bite located?"      Under my left arm is one that is size of a dime, it's hard and risen.   The knot is red with a purple ring around it.    I've been sleeping a lot.   I just noticed it yesterday.    3. ONSET: "How long do you think the tick was attached before you removed it?" (Hours or days)              Left arm, both of back of my legs and my groin area is where I was bitten by a tick but the place under my left arm is different. 4. TETANUS: "When was the last tetanus booster?"      I had one 2016 I think.   5. PREGNANCY: "Is there any chance you are pregnant?" "When was your last menstrual period?"     N/A due to age  Protocols used: TICK BITE-A-AH

## 2018-09-27 NOTE — Telephone Encounter (Signed)
Pt called stating behavioral health doctor prescribed her a medication for nightmares. Pt states the medication is a beta blocker and is concerned it may affect her BP. Please advise.

## 2018-09-27 NOTE — Telephone Encounter (Signed)
Pt reported that she has used enema and her symptoms has cleared.  She would like advise on whether OV is still necessary.

## 2018-09-27 NOTE — Telephone Encounter (Signed)
Pt saw her PCP for dark blood present in stool. They referred her to GI. She had some mesalamine enemas on hand and used them. Reports now her stool is back to normal, she wanted to know if she needed to keep the OV with Dr. [name]. Discussed with her that she should keep the appt as scheduled. Pt verbalized understanding.

## 2018-09-27 NOTE — Telephone Encounter (Signed)
Spoke with patient and info given 

## 2018-09-27 NOTE — Telephone Encounter (Signed)
Try the medication as prescribed by her psychiatrist; we can check her blood pressure at the appointment on Friday.

## 2018-09-29 ENCOUNTER — Ambulatory Visit (INDEPENDENT_AMBULATORY_CARE_PROVIDER_SITE_OTHER): Payer: PPO | Admitting: Family

## 2018-09-29 ENCOUNTER — Other Ambulatory Visit: Payer: Self-pay

## 2018-09-29 ENCOUNTER — Encounter: Payer: Self-pay | Admitting: Family

## 2018-09-29 VITALS — BP 130/78 | HR 79 | Temp 98.6°F | Ht 65.0 in | Wt 192.0 lb

## 2018-09-29 DIAGNOSIS — L03312 Cellulitis of back [any part except buttock]: Secondary | ICD-10-CM

## 2018-09-29 MED ORDER — DOXYCYCLINE HYCLATE 100 MG PO TABS: 100.0000 mg | ORAL_TABLET | Freq: Two times a day (BID) | ORAL | 0 refills | Status: DC

## 2018-09-29 NOTE — Progress Notes (Signed)
[name] [name] is a 58 y.o. female with the following history as recorded in EpicCare:  Patient Active Problem List   Diagnosis Date Noted  . COPD (chronic obstructive pulmonary disease) ([place]) 11/08/2017  . Iron overload 09/22/2017  . Secondary hemochromatosis 09/22/2017  . Allergic rhinitis 09/05/2017  . Hemoptysis 05/26/2017  . Pulmonary nodule, right 11/29/2016  . Polyarthralgia 12/02/2015  . Varicose veins of both lower extremities 12/02/2015  . Hypertensive retinopathy of both eyes 11/24/2015  . Elevated liver enzymes 10/31/2015  . Hyperglycemia 10/31/2015  . SIRS (systemic inflammatory response syndrome) ([place]) 03/11/2013  . Hypertension   . Hyperlipidemia   . Anemia   . Hematochezia 08/14/2012  . ETOH abuse 08/14/2012  . Pseudocyst of pancreas 08/12/2012  . Acute pancreatitis 08/12/2012  . GERD (gastroesophageal reflux disease) 06/08/2012  . Rectal fissure 05/11/2011  . Rectal fistula 05/11/2011  . Hiatal hernia 04/28/2011  . Gastritis 04/28/2011  . Nausea 04/27/2011  . Acute epigastric pain 04/27/2011  . Obesity, Class III, BMI 40-49.9 (morbid obesity) ([place]) 04/27/2011  . Solitary rectal ulcer 04/12/2011  . Hemorrhoids 03/10/2011  . Ulcerative proctitis, nonspecific ([place]) 03/10/2011  . DIVERTICULOSIS, COLON 10/22/2009  . COLONIC POLYPS, HYPERPLASTIC, HX OF 10/22/2009  . INFECTIOUS DIARRHEA 09/27/2008  . ULCERATIVE PROCTITIS 09/27/2008  . NAUSEA AND VOMITING 09/27/2008  . NAUSEA ALONE 09/27/2008  . DIARRHEA 09/27/2008  . ABDOMINAL PAIN -GENERALIZED 09/27/2008  . DEPRESSION 09/12/2007  . HEMORRHOIDS 09/12/2007  . HIATAL HERNIA 09/12/2007  . ULCERATIVE COLITIS 09/12/2007  . RECTAL PAIN 09/12/2007  . BIPOLAR AFFECTIVE DISORDER 12/06/2006  . TOBACCO ABUSE 12/06/2006  . Esophageal reflux 12/06/2006  . Cough 12/06/2006    Current Outpatient Medications  Medication Sig Dispense Refill  . ANUCORT-HC 25 MG suppository INSERT ONE SUPPOSITORY RECTALLY TWICE A DAY FOR  7 DAYS 14 suppository 0  . ARIPiprazole (ABILIFY) 30 MG tablet     . atorvastatin (LIPITOR) 40 MG tablet Take 1 tablet (40 mg total) by mouth daily at 6 PM. take 1 tablet by mouth at bedtime 90 tablet 1  . benzonatate (TESSALON) 200 MG capsule TAKE ONE CAPSULE BY MOUTH EVERY 6 HOURS AS NEEDED FOR COUGH 20 capsule 0  . blood glucose meter kit and supplies KIT Dispense based on patient and insurance preference. Use up to four times daily as directed. (FOR ICD-9 250.00, 250.01). 1 each 0  . brimonidine (ALPHAGAN) 0.2 % ophthalmic solution     . DEXILANT 60 MG capsule TAKE ONE CAPSULE BY MOUTH DAILY 30 capsule 5  . diclofenac sodium (VOLTAREN) 1 % GEL Apply 2 g topically 4 (four) times daily.     Marland Kitchen FLUoxetine (PROZAC) 20 MG capsule Take 20 mg by mouth 2 (two) times daily.     . fluticasone (FLONASE) 50 MCG/ACT nasal spray Place 2 sprays into both nostrils daily. 48 g 3  . glucose blood (ONE TOUCH ULTRA TEST) test strip     . HYDROcodone-acetaminophen (NORCO/VICODIN) 5-325 MG tablet Take 1 tablet by mouth 4 (four) times daily as needed for moderate pain.     . hydrocortisone (ANUSOL-HC) 25 MG suppository Place 1 suppository (25 mg total) rectally at bedtime. 14 suppository 0  . ketorolac (ACULAR) 0.5 % ophthalmic solution     . lamoTRIgine (LAMICTAL) 100 MG tablet Take 100 mg by mouth 2 (two) times daily.     . Lancets (ONETOUCH DELICA PLUS [id number]) MISC USE TO TEST BLOOD SUGAR UP TO FOUR TIMES A DAY AS DIRECTED 100 each 0  .  mesalamine (ROWASA) 4 g enema Place 60 mLs (4 g total) rectally at bedtime. 30 Bottle 1  . Mesalamine-Cleanser 4 g KIT     . methocarbamol (ROBAXIN) 500 MG tablet Take 500 mg by mouth 2 (two) times daily as needed for muscle spasms.     . Multiple Vitamin (MULTIVITAMIN WITH MINERALS) TABS tablet Take 1 tablet by mouth daily. 30 tablet 0  . NARCAN 4 MG/0.1ML LIQD nasal spray kit Place 0.4 mg into the nose once.     Marland Kitchen ofloxacin (OCUFLOX) 0.3 % ophthalmic solution     .  pramoxine-hydrocortisone (PROCTOCREAM-HC) 1-1 % rectal cream Place 1 application rectally 2 (two) times daily. 30 g 0  . prednisoLONE acetate (PRED FORTE) 1 % ophthalmic suspension     . promethazine (PHENERGAN) 25 MG suppository INSERT ONE SUPPOSITORY RECTALLY EVERY 6 HOURS AS NEEDED FOR NAUSEA AND VOMITING 30 suppository 3  . Tetrahydrozoline HCl (VISINE OP) Place 1-2 drops into both eyes daily.    . traMADol (ULTRAM) 50 MG tablet     . Vitamin D, Ergocalciferol, (DRISDOL) 1.25 MG (50000 UT) CAPS capsule TAKE ONE CAPSULE BY MOUTH EVERY 7 DAYS FOR 12 DOSES. 12 capsule 0  . albuterol (PROAIR HFA) 108 (90 Base) MCG/ACT inhaler Inhale 2 puffs into the lungs every 4 (four) hours as needed for wheezing or shortness of breath. (Patient not taking: Reported on 04/05/2018) 1 Inhaler 5  . doxycycline (VIBRA-TABS) 100 MG tablet Take 1 tablet (100 mg total) by mouth 2 (two) times daily. 14 tablet 0   No current facility-administered medications for this visit.     Allergies: Patient has no known allergies.  Past Medical History:  Diagnosis Date  . Anal fistula   . Anemia   . Anxiety   . Arthritis   . Bipolar disorder ([place])   . Chronic kidney disease   . Depressive disorder, not elsewhere classified   . Diverticulosis of colon (without mention of hemorrhage)   . Esophageal reflux   . GERD (gastroesophageal reflux disease)   . Hepatic steatosis   . Hiatal hernia   . History of recurrent UTIs   . Hyperlipidemia   . Hypertension   . Iron deficiency   . Obesity   . Pancreatitis   . Pancreatitis   . Personal history of colonic polyps 10/16/2009   hyperplastic  . Pre-diabetes   . Seizures ([place])   . Ulcerative colitis, unspecified     Past Surgical History:  Procedure Laterality Date  . FINGER AMPUTATION  2010   right index  . HEMORROIDECTOMY    . KNEE ARTHROSCOPY     bilateral, left x2  . TOTAL ABDOMINAL HYSTERECTOMY    . VIDEO BRONCHOSCOPY Bilateral 05/31/2017   Procedure: VIDEO  BRONCHOSCOPY WITHOUT FLUORO;  Surgeon: Collene Gobble, MD;  Location: [facility];  Service: Cardiopulmonary;  Laterality: Bilateral;    Family History  Problem Relation Age of Onset  . Irritable bowel syndrome Mother   . Hypertension Mother   . Atrial fibrillation Mother   . Ovarian cancer Maternal Aunt   . Diabetes Father   . Heart attack Father   . Hypertension Father   . Hyperlipidemia Father   . Thyroid disease Sister   . Hyperlipidemia Sister   . Thyroid disease Brother   . Colon polyps Brother   . Stomach cancer Neg Hx   . Colon cancer Neg Hx     Social History   Tobacco Use  . Smoking status: Current Every Day  Smoker    Packs/day: 0.25    Types: Cigars, Cigarettes  . Smokeless tobacco: Never Used  . Tobacco comment: pt states she smokes 3 cigarettes/ day. Trying to quit. Taking Chantix  Substance Use Topics  . Alcohol use: Yes    Alcohol/week: 1.0 standard drinks    Types: 1 Shots of liquor per week    Comment: occ    Subjective:  Suspected spider back on upper left back occurred Tuesday; did not pull a tick off the area- suspects that an insect got trapped in her shirt; has used Benadryl with some relief; no fever, joint aches or rash;   Planning to get vaccines updated through pharmacy on August 10- will get Shingrix, Prevnar and Hepatitis B;      Objective:  Vitals:   09/29/18 1036  BP: 130/78  Pulse: 79  Temp: 98.6 F (37 C)  TempSrc: Oral  SpO2: 96%  Weight: 192 lb 0.6 oz (87.1 kg)  Height: '5\' 5"'$  (1.651 m)    General: Well developed, well nourished, in no acute distress  Skin : Warm and dry. Localized area of redness c/w insect bite at left shoulder blade; no streaking noted;  Head: Normocephalic and atraumatic  Lungs: Respirations unlabored; clear to auscultation bilaterally without wheeze, rales, rhonchi  Neurologic: Alert and oriented; speech intact; face symmetrical;   Assessment:  1. Cellulitis of back except buttock     Plan:   Localized- suspect spider or flying insect bite; Rx for Doxycycline 100 mg bid x 7 days; Keep upcoming appointment with specialists and follow-up here as needed otherwise.   No follow-ups on file.  No orders of the defined types were placed in this encounter.   Requested Prescriptions   Signed Prescriptions Disp Refills  . doxycycline (VIBRA-TABS) 100 MG tablet 14 tablet 0    Sig: Take 1 tablet (100 mg total) by mouth 2 (two) times daily.

## 2018-10-05 ENCOUNTER — Ambulatory Visit (INDEPENDENT_AMBULATORY_CARE_PROVIDER_SITE_OTHER): Payer: PPO | Admitting: Internal Medicine

## 2018-10-05 ENCOUNTER — Encounter: Payer: Self-pay | Admitting: Internal Medicine

## 2018-10-05 ENCOUNTER — Encounter

## 2018-10-05 VITALS — BP 110/60 | HR 84 | Temp 98.1°F | Ht 63.5 in | Wt 192.1 lb

## 2018-10-05 DIAGNOSIS — K625 Hemorrhage of anus and rectum: Secondary | ICD-10-CM | POA: Diagnosis not present

## 2018-10-05 DIAGNOSIS — K602 Anal fissure, unspecified: Secondary | ICD-10-CM

## 2018-10-05 MED ORDER — DILTIAZEM GEL 2 %: 1.0000 "application " | Freq: Every day | CUTANEOUS | 3 refills | Status: DC

## 2018-10-05 MED ORDER — DILTIAZEM GEL 2 %: 1.0000 "application " | Freq: Two times a day (BID) | CUTANEOUS | 3 refills | Status: DC

## 2018-10-05 NOTE — Patient Instructions (Signed)
We have sent the following medications to your pharmacy for you to pick up at your convenience:  Diltiazem.  This needs to be picked up at [facility] [address] [place], [place] [address]  [phone]  Please purchase Metamucil over the counter and use daily

## 2018-10-06 ENCOUNTER — Encounter: Payer: Self-pay | Admitting: Internal Medicine

## 2018-10-06 ENCOUNTER — Telehealth: Payer: Self-pay | Admitting: Internal Medicine

## 2018-10-06 NOTE — Telephone Encounter (Signed)
Pt would like to know whether she can take Critrucel instead of metamucil.

## 2018-10-06 NOTE — Progress Notes (Signed)
HISTORY OF PRESENT ILLNESS:  [name] [name] is a 58 y.o. female with multiple significant medical problems as listed below, bipolar disorder, alcohol related pancreatitis, morbid obesity, GERD, and chronic pain syndrome on narcotics.  Previous patient of Dr. Verl Blalock.  I saw the patient January 2019 regarding nausea with vomiting, globus sensation, GERD, morbid obesity, and a history of multiple adenomatous colon polyps on recent colonoscopy.  She presents today with a chief complaint of rectal pain and rectal bleeding.  Her last colonoscopy was performed October 2018.  She was found to have 4 subcentimeter colon polyps, diverticulosis, and internal/external hemorrhoids.  The polyps were tubular adenomas and follow-up in 3 years recommended.  Patient tells me that she has noticed some bright red clots and darker stool.  She also reports what sounds like prolapsing hemorrhoids.  However more recently there has been pain with defecation.  Multiple other chronic GI complaints include bloating, GERD, and alternating bowel habits.  Blood work from September 05, 2018 reveals hemoglobin 12.1.  At baseline.  REVIEW OF SYSTEMS:  All non-GI ROS negative unless otherwise stated in the HPI except for anxiety, allergies, arthritis, back pain, cough, depression, excessive thirst  Past Medical History:  Diagnosis Date  . Anal fistula   . Anemia   . Anxiety   . Arthritis   . Bipolar disorder ([place])   . Chronic kidney disease   . Depressive disorder, not elsewhere classified   . Diverticulosis of colon (without mention of hemorrhage)   . Esophageal reflux   . Fatty liver   . GERD (gastroesophageal reflux disease)   . Hepatic steatosis   . Hiatal hernia   . History of recurrent UTIs   . Hyperlipidemia   . Hypertension   . Iron deficiency   . Obesity   . Pancreatitis   . Pancreatitis   . Personal history of colonic polyps 10/16/2009   hyperplastic  . Pre-diabetes   . Seizures ([place])   . Ulcerative  colitis, unspecified     Past Surgical History:  Procedure Laterality Date  . FINGER AMPUTATION  2010   right index  . HEMORROIDECTOMY    . KNEE ARTHROSCOPY     bilateral, left x2  . TOTAL ABDOMINAL HYSTERECTOMY    . VIDEO BRONCHOSCOPY Bilateral 05/31/2017   Procedure: VIDEO BRONCHOSCOPY WITHOUT FLUORO;  Surgeon: Collene Gobble, MD;  Location: [facility];  Service: Cardiopulmonary;  Laterality: Bilateral;    Social History VAEDA WESTALL  reports that she has been smoking cigars and cigarettes. She has been smoking about 0.25 packs per day. She has never used smokeless tobacco. She reports current alcohol use of about 1.0 standard drinks of alcohol per week. She reports that she does not use drugs.  family history includes Atrial fibrillation in her mother; Colon polyps in her brother; Diabetes in her father; Heart attack in her father; Hyperlipidemia in her father and sister; Hypertension in her father and mother; Irritable bowel syndrome in her mother; Ovarian cancer in her maternal aunt; Thyroid disease in her brother and sister.  No Known Allergies     PHYSICAL EXAMINATION: Vital signs: BP 110/60 (BP Location: Left Arm, Patient Position: Sitting, Cuff Size: Normal)   Pulse 84   Temp 98.1 F (36.7 C)   Ht 5' 3.5" (1.613 m) Comment: height measured without shoes  Wt 192 lb 2 oz (87.1 kg)   BMI 33.50 kg/m   Constitutional: generally well-appearing, no acute distress Psychiatric: alert and oriented x3, cooperative Eyes: extraocular  movements intact, anicteric, conjunctiva pink Mouth: oral pharynx moist, no lesions Neck: supple no lymphadenopathy Cardiovascular: heart regular rate and rhythm, no murmur Lungs: clear to auscultation bilaterally Abdomen: soft, obese, nontender, nondistended, no obvious ascites, no peritoneal signs, normal bowel sounds, no organomegaly Rectal: Painful posterior anal fissure with blood.  Noninflamed unremarkable external hemorrhoids Extremities:  no clubbing, cyanosis, or lower extremity edema bilaterally Skin: no lesions on visible extremities Neuro: No focal deficits.  Cranial nerves intact  ASSESSMENT:  1.  Posterior anal fissure to explain rectal pain or bleeding 2.  History of adenomatous colon polyps.  Last colonoscopy October 2018 3.  Known internal and external hemorrhoids. 4.  GERD 5.  Multiple medical problems  PLAN:  1.  Daily fiber supplementation with Metamucil 2 tablespoons 2.  Prescribe 2% diltiazem ointment.  Apply to affected area in a circular manner as directed 5 times daily 3.  Sitz bath as twice daily 4.  Follow-up 6 weeks.

## 2018-10-09 NOTE — Progress Notes (Deleted)
Cardiology Office Note:    Date:  10/09/2018   ID:  [name], DOB 01/07/1961, MRN [id number]  PCP:  [name], [place]  Cardiologist:  No primary care provider on file.   Referring MD: [name],*   No chief complaint on file.   History of Present Illness:    [name] [name] is a 58 y.o. female with a hx of  Tobacco abuse, CKD, hyperlipidemia, and hypertension referred for evaluation of syncope.  Past Medical History:  Diagnosis Date  . Anal fistula   . Anemia   . Anxiety   . Arthritis   . Bipolar disorder ([place])   . Chronic kidney disease   . Depressive disorder, not elsewhere classified   . Diverticulosis of colon (without mention of hemorrhage)   . Esophageal reflux   . Fatty liver   . GERD (gastroesophageal reflux disease)   . Hepatic steatosis   . Hiatal hernia   . History of recurrent UTIs   . Hyperlipidemia   . Hypertension   . Iron deficiency   . Obesity   . Pancreatitis   . Pancreatitis   . Personal history of colonic polyps 10/16/2009   hyperplastic  . Pre-diabetes   . Seizures ([place])   . Ulcerative colitis, unspecified     Past Surgical History:  Procedure Laterality Date  . FINGER AMPUTATION  2010   right index  . HEMORROIDECTOMY    . KNEE ARTHROSCOPY     bilateral, left x2  . TOTAL ABDOMINAL HYSTERECTOMY    . VIDEO BRONCHOSCOPY Bilateral 05/31/2017   Procedure: VIDEO BRONCHOSCOPY WITHOUT FLUORO;  Surgeon: Collene Gobble, MD;  Location: [facility];  Service: Cardiopulmonary;  Laterality: Bilateral;    Current Medications: No outpatient medications have been marked as taking for the 10/11/18 encounter (Appointment) with Belva Crome, MD.     Allergies:   Patient has no known allergies.   Social History   Socioeconomic History  . Marital status: Single    Spouse name: Not on file  . Number of children: 0  . Years of education: 2  . Highest education level: Not on file  Occupational History  . Occupation:  produce Armed forces operational officer: UNEMPLOYED  Social Needs  . Financial resource strain: Not on file  . Food insecurity    Worry: Not on file    Inability: Not on file  . Transportation needs    Medical: Not on file    Non-medical: Not on file  Tobacco Use  . Smoking status: Current Every Day Smoker    Packs/day: 0.25    Types: Cigars, Cigarettes  . Smokeless tobacco: Never Used  . Tobacco comment: pt states she smokes 3 cigarettes/ day. Trying to quit. Taking Chantix  Substance and Sexual Activity  . Alcohol use: Yes    Alcohol/week: 1.0 standard drinks    Types: 1 Shots of liquor per week    Comment: occ  . Drug use: No  . Sexual activity: Not Currently  Lifestyle  . Physical activity    Days per week: Not on file    Minutes per session: Not on file  . Stress: Not on file  Relationships  . Social Herbalist on phone: Not on file    Gets together: Not on file    Attends religious service: Not on file    Active member of club or organization: Not on file    Attends meetings of clubs or  organizations: Not on file    Relationship status: Not on file  Other Topics Concern  . Not on file  Social History Narrative  . Not on file     Family History: The patient's family history includes Atrial fibrillation in her mother; Colon polyps in her brother; Diabetes in her father; Heart attack in her father; Hyperlipidemia in her father and sister; Hypertension in her father and mother; Irritable bowel syndrome in her mother; Ovarian cancer in her maternal aunt; Thyroid disease in her brother and sister. There is no history of Stomach cancer or Colon cancer.  ROS:   Please see the history of present illness.    *** All other systems reviewed and are negative.  EKGs/Labs/Other Studies Reviewed:    The following studies were reviewed today: ***  EKG:  EKG ***  Recent Labs: 02/07/2018: Magnesium 1.5 09/05/2018: ALT 48; BUN 8; Creatinine, Ser 0.60; Hemoglobin 12.1; Platelets  128.0; Potassium 4.0; Sodium 134; TSH 1.64  Recent Lipid Panel    Component Value Date/Time   CHOL 302 (H) 02/07/2018 1013   TRIG 165.0 (H) 02/07/2018 1013   HDL 107.20 02/07/2018 1013   CHOLHDL 3 02/07/2018 1013   VLDL 33.0 02/07/2018 1013   LDLCALC 162 (H) 02/07/2018 1013   LDLDIRECT 192.5 06/08/2012 1106    Physical Exam:    VS:  There were no vitals taken for this visit.    Wt Readings from Last 3 Encounters:  10/05/18 192 lb 2 oz (87.1 kg)  09/29/18 192 lb 0.6 oz (87.1 kg)  09/05/18 190 lb 3.2 oz (86.3 kg)     GEN: ***. No acute distress HEENT: Normal NECK: No JVD. LYMPHATICS: No lymphadenopathy CARDIAC: *** RRR without murmur, gallop, or edema. VASCULAR: *** Normal Pulses. No bruits. RESPIRATORY:  Clear to auscultation without rales, wheezing or rhonchi  ABDOMEN: Soft, non-tender, non-distended, No pulsatile mass, MUSCULOSKELETAL: No deformity  SKIN: Warm and dry NEUROLOGIC:  Alert and oriented x 3 PSYCHIATRIC:  Normal affect   ASSESSMENT:    1. Palpitations   2. Mixed hyperlipidemia   3. Essential hypertension   4. TOBACCO ABUSE   5. Bipolar disorder in full remission, most recent episode unspecified type ([place])   6. Educated About Covid-19 Virus Infection    PLAN:    In order of problems listed above:  1. ***   Medication Adjustments/Labs and Tests Ordered: Current medicines are reviewed at length with the patient today.  Concerns regarding medicines are outlined above.  No orders of the defined types were placed in this encounter.  No orders of the defined types were placed in this encounter.   There are no Patient Instructions on file for this visit.   Signed, Sinclair Grooms, MD  10/09/2018 9:24 PM    [place]

## 2018-10-09 NOTE — Telephone Encounter (Signed)
Spoke to patient and confirmed it is ok to take Citrucel instead of Metamucil.  Patient agreed.

## 2018-10-11 ENCOUNTER — Ambulatory Visit: Payer: PPO | Admitting: Interventional Cardiology

## 2018-10-16 ENCOUNTER — Encounter: Payer: Self-pay | Admitting: Family

## 2018-10-17 DIAGNOSIS — M7060 Trochanteric bursitis, unspecified hip: Secondary | ICD-10-CM | POA: Diagnosis not present

## 2018-10-17 DIAGNOSIS — M5136 Other intervertebral disc degeneration, lumbar region: Secondary | ICD-10-CM | POA: Diagnosis not present

## 2018-10-17 DIAGNOSIS — Z5181 Encounter for therapeutic drug level monitoring: Secondary | ICD-10-CM | POA: Diagnosis not present

## 2018-10-17 DIAGNOSIS — G894 Chronic pain syndrome: Secondary | ICD-10-CM | POA: Diagnosis not present

## 2018-10-18 ENCOUNTER — Encounter: Payer: Self-pay | Admitting: Family

## 2018-11-06 ENCOUNTER — Ambulatory Visit: Payer: PPO | Admitting: Family

## 2018-11-08 IMAGING — US US ABDOMEN COMPLETE
1 series · 14 of 25 positions shown · non-contrast
Comparison: CT abdomen pelvis 03/11/2013

CLINICAL DATA: Elevated LFT

EXAM:
ABDOMEN ULTRASOUND COMPLETE

[Series 1: us abdomen complete · 0.16mm/px · 14 of 86 slices shown]
[im 1/86]
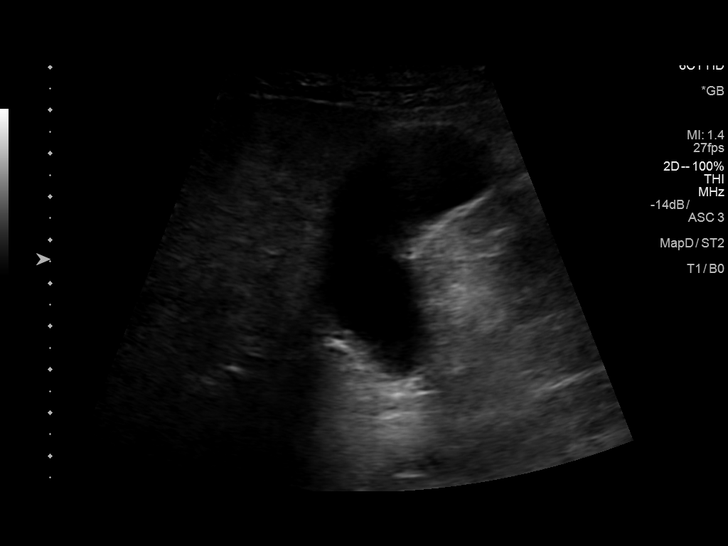
[im 8/86]
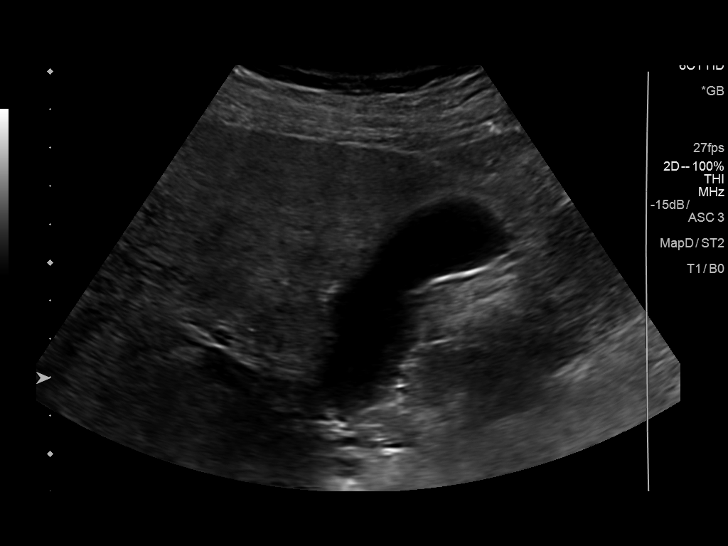
[im 15/86]
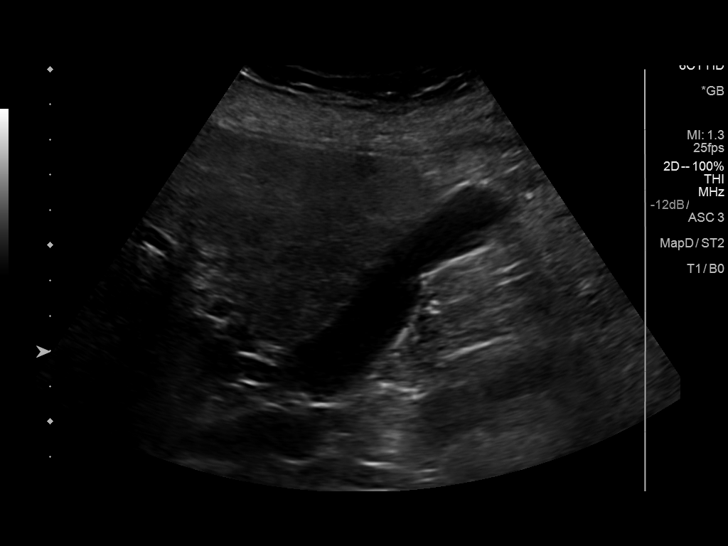
[im 22/86]
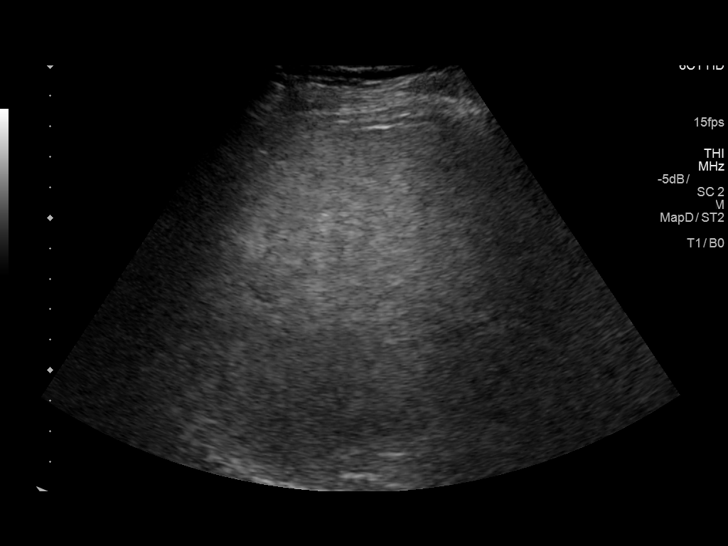
[im 29/86]
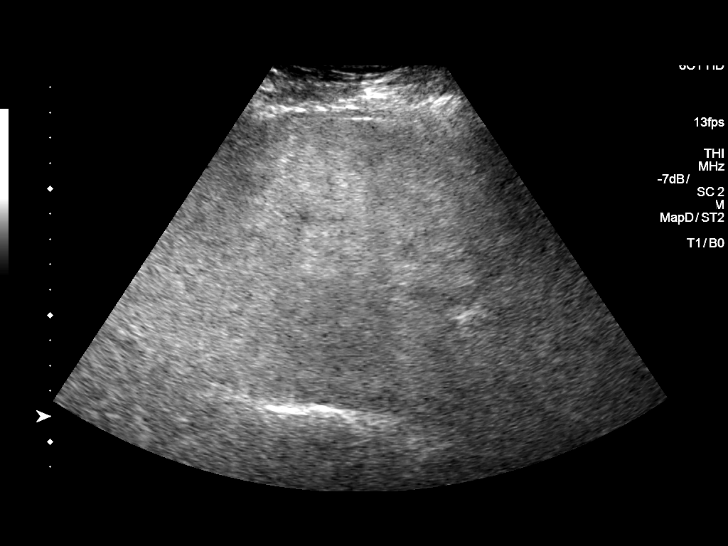
[im 32/86]
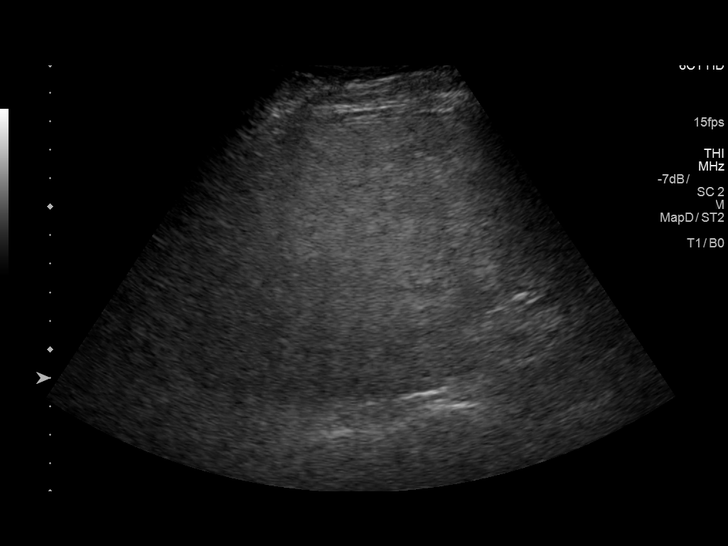
[im 39/86]
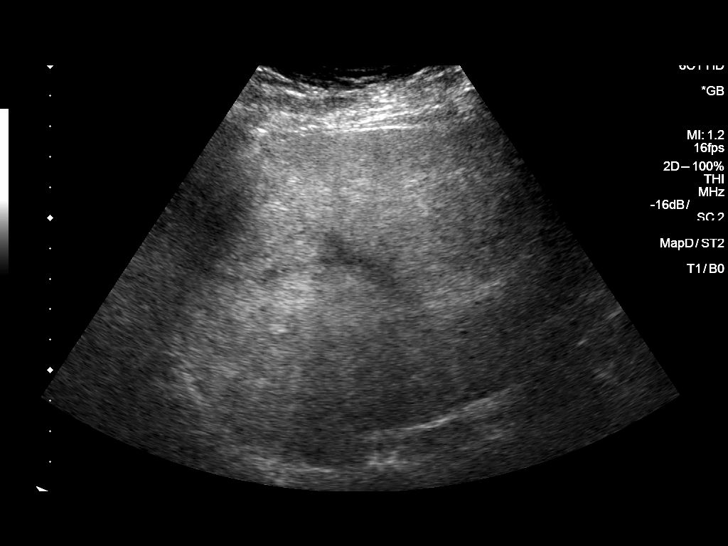
[im 47/86]
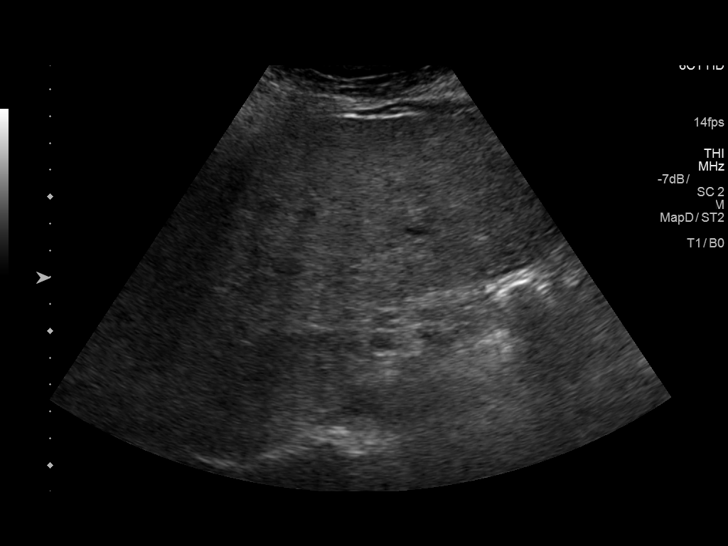
[im 54/86]
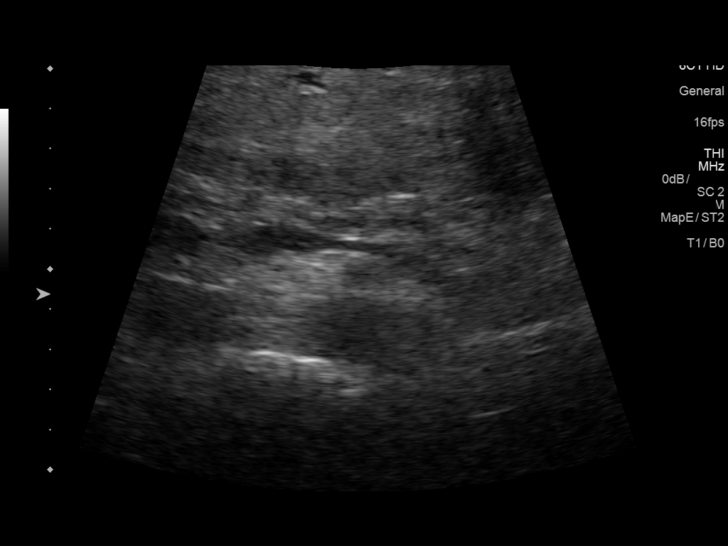
[im 57/86]
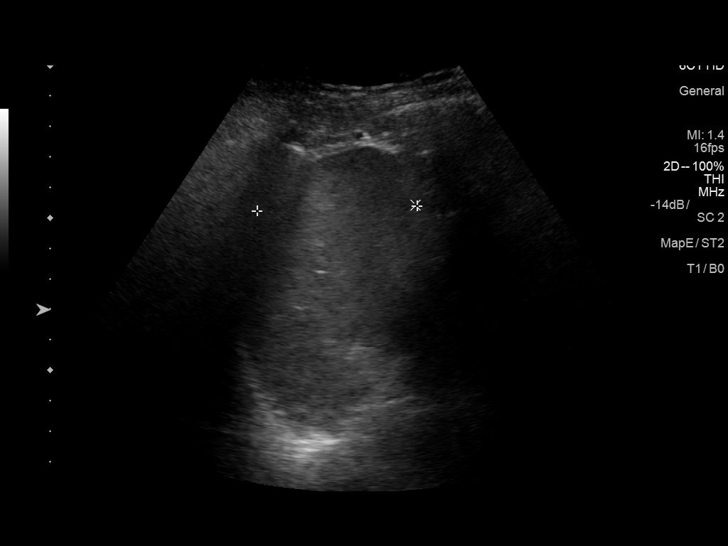
[im 64/86]
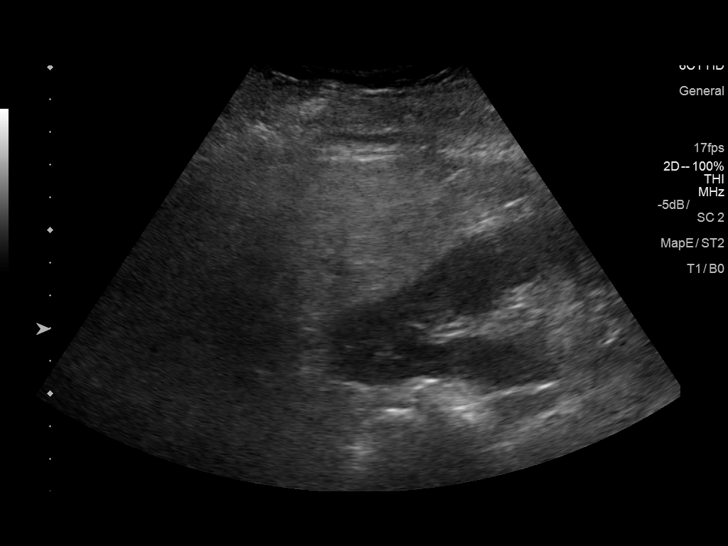
[im 71/86]
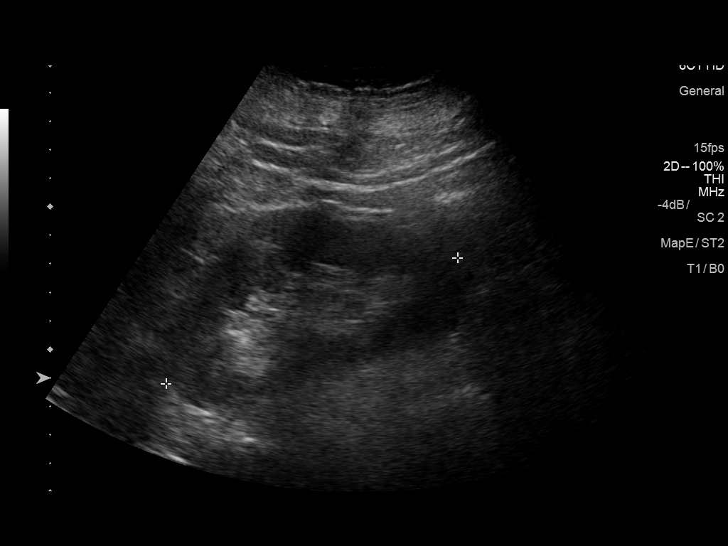
[im 78/86]
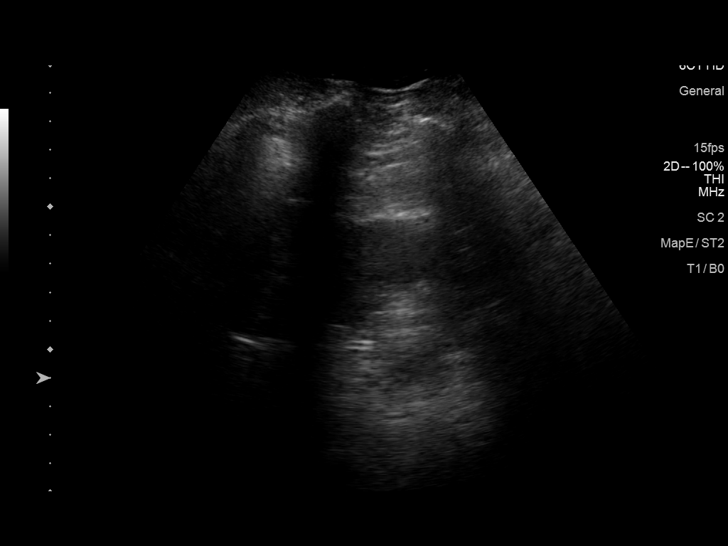
[im 86/86]
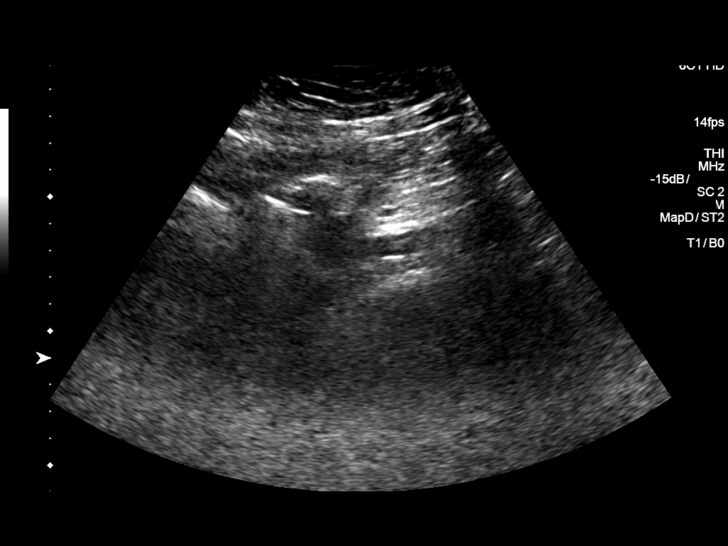

[14 of 25 positions shown; findings below may reference images not displayed]

FINDINGS: Gallbladder: No gallstones or wall thickening visualized. No
sonographic Murphy sign noted by sonographer.

Common bile duct: Diameter: 3.3 mm

Liver: Heterogeneous coarse echotexture liver diffusely without
focal lesion. Portal vein is patent on color Doppler imaging with
normal direction of blood flow towards the liver.

IVC: No abnormality visualized.

Pancreas: Visualized portion unremarkable.

Spleen: Size and appearance within normal limits.

Right Kidney: Length: 12.3 cm. Echogenicity within normal limits. No
mass or hydronephrosis visualized.

Left Kidney: Length: 11.6 cm. Echogenicity within normal limits. No
mass or hydronephrosis visualized.

Abdominal aorta: No aneurysm visualized.

Other findings: Negative for ascites
IMPRESSION: Coarse echotexture liver diffusely suggesting fatty infiltration or
cirrhosis. No focal liver lesion. No ascites

Negative for gallstones

## 2018-11-15 DIAGNOSIS — M5136 Other intervertebral disc degeneration, lumbar region: Secondary | ICD-10-CM | POA: Diagnosis not present

## 2018-11-15 DIAGNOSIS — G894 Chronic pain syndrome: Secondary | ICD-10-CM | POA: Diagnosis not present

## 2018-11-15 DIAGNOSIS — M7060 Trochanteric bursitis, unspecified hip: Secondary | ICD-10-CM | POA: Diagnosis not present

## 2018-11-15 DIAGNOSIS — M545 Low back pain: Secondary | ICD-10-CM | POA: Diagnosis not present

## 2018-11-20 DIAGNOSIS — M4726 Other spondylosis with radiculopathy, lumbar region: Secondary | ICD-10-CM | POA: Diagnosis not present

## 2018-11-20 DIAGNOSIS — M7060 Trochanteric bursitis, unspecified hip: Secondary | ICD-10-CM | POA: Diagnosis not present

## 2018-11-20 DIAGNOSIS — G8929 Other chronic pain: Secondary | ICD-10-CM | POA: Diagnosis not present

## 2018-11-20 DIAGNOSIS — M25519 Pain in unspecified shoulder: Secondary | ICD-10-CM | POA: Diagnosis not present

## 2018-11-20 DIAGNOSIS — M25559 Pain in unspecified hip: Secondary | ICD-10-CM | POA: Diagnosis not present

## 2018-11-20 DIAGNOSIS — M4807 Spinal stenosis, lumbosacral region: Secondary | ICD-10-CM | POA: Diagnosis not present

## 2018-11-20 DIAGNOSIS — M48062 Spinal stenosis, lumbar region with neurogenic claudication: Secondary | ICD-10-CM | POA: Diagnosis not present

## 2018-11-20 DIAGNOSIS — M792 Neuralgia and neuritis, unspecified: Secondary | ICD-10-CM | POA: Diagnosis not present

## 2018-11-20 DIAGNOSIS — M47897 Other spondylosis, lumbosacral region: Secondary | ICD-10-CM | POA: Diagnosis not present

## 2018-11-20 DIAGNOSIS — G894 Chronic pain syndrome: Secondary | ICD-10-CM | POA: Diagnosis not present

## 2018-11-20 DIAGNOSIS — M545 Low back pain: Secondary | ICD-10-CM | POA: Diagnosis not present

## 2018-11-20 DIAGNOSIS — M5136 Other intervertebral disc degeneration, lumbar region: Secondary | ICD-10-CM | POA: Diagnosis not present

## 2018-11-22 DIAGNOSIS — F1010 Alcohol abuse, uncomplicated: Secondary | ICD-10-CM | POA: Diagnosis not present

## 2018-11-22 DIAGNOSIS — K7030 Alcoholic cirrhosis of liver without ascites: Secondary | ICD-10-CM | POA: Diagnosis not present

## 2018-11-27 ENCOUNTER — Other Ambulatory Visit: Payer: Self-pay | Admitting: Family

## 2018-11-27 ENCOUNTER — Encounter: Payer: Self-pay | Admitting: Family

## 2018-11-27 DIAGNOSIS — E782 Mixed hyperlipidemia: Secondary | ICD-10-CM

## 2018-11-28 ENCOUNTER — Ambulatory Visit: Payer: PPO | Admitting: Interventional Cardiology

## 2018-12-13 DIAGNOSIS — F319 Bipolar disorder, unspecified: Secondary | ICD-10-CM | POA: Diagnosis not present

## 2018-12-13 DIAGNOSIS — F4310 Post-traumatic stress disorder, unspecified: Secondary | ICD-10-CM | POA: Diagnosis not present

## 2018-12-20 DIAGNOSIS — M7060 Trochanteric bursitis, unspecified hip: Secondary | ICD-10-CM | POA: Diagnosis not present

## 2018-12-20 DIAGNOSIS — M545 Low back pain: Secondary | ICD-10-CM | POA: Diagnosis not present

## 2018-12-20 DIAGNOSIS — Z5181 Encounter for therapeutic drug level monitoring: Secondary | ICD-10-CM | POA: Diagnosis not present

## 2018-12-20 DIAGNOSIS — G894 Chronic pain syndrome: Secondary | ICD-10-CM | POA: Diagnosis not present

## 2018-12-20 DIAGNOSIS — M5136 Other intervertebral disc degeneration, lumbar region: Secondary | ICD-10-CM | POA: Diagnosis not present

## 2019-01-21 NOTE — Progress Notes (Deleted)
Cardiology Office Note:    Date:  01/21/2019   ID:  [name] [name], DOB Sep 20, 1960, MRN [id number]  PCP:  [name], [place]  Cardiologist:  No primary care provider on file.   Referring MD: [name],*   No chief complaint on file.   History of Present Illness:    [name] [name] is a 58 y.o. female with a hx of  Referred for consultation by Dierdre Forth FNP.  ***  Past Medical History:  Diagnosis Date  . Anal fistula   . Anemia   . Anxiety   . Arthritis   . Bipolar disorder ([place])   . Chronic kidney disease   . Depressive disorder, not elsewhere classified   . Diverticulosis of colon (without mention of hemorrhage)   . Esophageal reflux   . Fatty liver   . GERD (gastroesophageal reflux disease)   . Hepatic steatosis   . Hiatal hernia   . History of recurrent UTIs   . Hyperlipidemia   . Hypertension   . Iron deficiency   . Obesity   . Pancreatitis   . Pancreatitis   . Personal history of colonic polyps 10/16/2009   hyperplastic  . Pre-diabetes   . Seizures ([place])   . Ulcerative colitis, unspecified     Past Surgical History:  Procedure Laterality Date  . FINGER AMPUTATION  2010   right index  . HEMORROIDECTOMY    . KNEE ARTHROSCOPY     bilateral, left x2  . TOTAL ABDOMINAL HYSTERECTOMY    . VIDEO BRONCHOSCOPY Bilateral 05/31/2017   Procedure: VIDEO BRONCHOSCOPY WITHOUT FLUORO;  Surgeon: Collene Gobble, MD;  Location: [facility];  Service: Cardiopulmonary;  Laterality: Bilateral;    Current Medications: No outpatient medications have been marked as taking for the 01/24/19 encounter (Appointment) with Belva Crome, MD.     Allergies:   Patient has no known allergies.   Social History   Socioeconomic History  . Marital status: Single    Spouse name: Not on file  . Number of children: 0  . Years of education: 2  . Highest education level: Not on file  Occupational History  . Occupation: produce Armed forces operational officer:  UNEMPLOYED  Social Needs  . Financial resource strain: Not on file  . Food insecurity    Worry: Not on file    Inability: Not on file  . Transportation needs    Medical: Not on file    Non-medical: Not on file  Tobacco Use  . Smoking status: Current Every Day Smoker    Packs/day: 0.25    Types: Cigars, Cigarettes  . Smokeless tobacco: Never Used  . Tobacco comment: pt states she smokes 3 cigarettes/ day. Trying to quit. Taking Chantix  Substance and Sexual Activity  . Alcohol use: Yes    Alcohol/week: 1.0 standard drinks    Types: 1 Shots of liquor per week    Comment: occ  . Drug use: No  . Sexual activity: Not Currently  Lifestyle  . Physical activity    Days per week: Not on file    Minutes per session: Not on file  . Stress: Not on file  Relationships  . Social Herbalist on phone: Not on file    Gets together: Not on file    Attends religious service: Not on file    Active member of club or organization: Not on file    Attends meetings of clubs or organizations:  Not on file    Relationship status: Not on file  Other Topics Concern  . Not on file  Social History Narrative  . Not on file     Family History: The patient's family history includes Atrial fibrillation in her mother; Colon polyps in her brother; Diabetes in her father; Heart attack in her father; Hyperlipidemia in her father and sister; Hypertension in her father and mother; Irritable bowel syndrome in her mother; Ovarian cancer in her maternal aunt; Thyroid disease in her brother and sister. There is no history of Stomach cancer or Colon cancer.  ROS:   Please see the history of present illness.    *** All other systems reviewed and are negative.  EKGs/Labs/Other Studies Reviewed:    The following studies were reviewed today: ***  EKG:  EKG ***  Recent Labs: 02/07/2018: Magnesium 1.5 09/05/2018: ALT 48; BUN 8; Creatinine, Ser 0.60; Hemoglobin 12.1; Platelets 128.0; Potassium 4.0;  Sodium 134; TSH 1.64  Recent Lipid Panel    Component Value Date/Time   CHOL 302 (H) 02/07/2018 1013   TRIG 165.0 (H) 02/07/2018 1013   HDL 107.20 02/07/2018 1013   CHOLHDL 3 02/07/2018 1013   VLDL 33.0 02/07/2018 1013   LDLCALC 162 (H) 02/07/2018 1013   LDLDIRECT 192.5 06/08/2012 1106    Physical Exam:    VS:  There were no vitals taken for this visit.    Wt Readings from Last 3 Encounters:  10/05/18 192 lb 2 oz (87.1 kg)  09/29/18 192 lb 0.6 oz (87.1 kg)  09/05/18 190 lb 3.2 oz (86.3 kg)     GEN: ***. No acute distress HEENT: Normal NECK: No JVD. LYMPHATICS: No lymphadenopathy CARDIAC: *** RRR without murmur, gallop, or edema. VASCULAR: *** Normal Pulses. No bruits. RESPIRATORY:  Clear to auscultation without rales, wheezing or rhonchi  ABDOMEN: Soft, non-tender, non-distended, No pulsatile mass, MUSCULOSKELETAL: No deformity  SKIN: Warm and dry NEUROLOGIC:  Alert and oriented x 3 PSYCHIATRIC:  Normal affect   ASSESSMENT:    1. Palpitations   2. Essential hypertension   3. Mixed hyperlipidemia   4. TOBACCO ABUSE   5. Bipolar disorder in full remission, most recent episode unspecified type ([place])   6. Educated about COVID-19 virus infection    PLAN:    In order of problems listed above:  1. ***   Medication Adjustments/Labs and Tests Ordered: Current medicines are reviewed at length with the patient today.  Concerns regarding medicines are outlined above.  No orders of the defined types were placed in this encounter.  No orders of the defined types were placed in this encounter.   There are no Patient Instructions on file for this visit.   Signed, Sinclair Grooms, MD  01/21/2019 2:12 PM    [facility]

## 2019-01-23 DIAGNOSIS — Z5181 Encounter for therapeutic drug level monitoring: Secondary | ICD-10-CM | POA: Diagnosis not present

## 2019-01-23 DIAGNOSIS — G894 Chronic pain syndrome: Secondary | ICD-10-CM | POA: Diagnosis not present

## 2019-01-23 DIAGNOSIS — M545 Low back pain: Secondary | ICD-10-CM | POA: Diagnosis not present

## 2019-01-23 DIAGNOSIS — M7060 Trochanteric bursitis, unspecified hip: Secondary | ICD-10-CM | POA: Diagnosis not present

## 2019-01-23 DIAGNOSIS — M5136 Other intervertebral disc degeneration, lumbar region: Secondary | ICD-10-CM | POA: Diagnosis not present

## 2019-01-24 ENCOUNTER — Ambulatory Visit: Payer: PPO | Admitting: Interventional Cardiology

## 2019-01-31 ENCOUNTER — Encounter: Payer: PPO | Admitting: Family

## 2019-02-27 DIAGNOSIS — M4726 Other spondylosis with radiculopathy, lumbar region: Secondary | ICD-10-CM | POA: Diagnosis not present

## 2019-02-27 DIAGNOSIS — M48062 Spinal stenosis, lumbar region with neurogenic claudication: Secondary | ICD-10-CM | POA: Diagnosis not present

## 2019-02-27 DIAGNOSIS — G894 Chronic pain syndrome: Secondary | ICD-10-CM | POA: Diagnosis not present

## 2019-02-27 DIAGNOSIS — M79609 Pain in unspecified limb: Secondary | ICD-10-CM | POA: Diagnosis not present

## 2019-02-27 DIAGNOSIS — M792 Neuralgia and neuritis, unspecified: Secondary | ICD-10-CM | POA: Diagnosis not present

## 2019-02-27 DIAGNOSIS — M5136 Other intervertebral disc degeneration, lumbar region: Secondary | ICD-10-CM | POA: Diagnosis not present

## 2019-02-27 DIAGNOSIS — E663 Overweight: Secondary | ICD-10-CM | POA: Diagnosis not present

## 2019-02-27 DIAGNOSIS — M47897 Other spondylosis, lumbosacral region: Secondary | ICD-10-CM | POA: Diagnosis not present

## 2019-02-27 DIAGNOSIS — M7060 Trochanteric bursitis, unspecified hip: Secondary | ICD-10-CM | POA: Diagnosis not present

## 2019-02-27 DIAGNOSIS — F419 Anxiety disorder, unspecified: Secondary | ICD-10-CM | POA: Diagnosis not present

## 2019-02-27 DIAGNOSIS — M9951 Intervertebral disc stenosis of neural canal of cervical region: Secondary | ICD-10-CM | POA: Diagnosis not present

## 2019-02-27 DIAGNOSIS — Z5181 Encounter for therapeutic drug level monitoring: Secondary | ICD-10-CM | POA: Diagnosis not present

## 2019-03-01 ENCOUNTER — Other Ambulatory Visit: Payer: Self-pay | Admitting: Family

## 2019-03-01 DIAGNOSIS — E782 Mixed hyperlipidemia: Secondary | ICD-10-CM

## 2019-03-04 ENCOUNTER — Other Ambulatory Visit: Payer: Self-pay | Admitting: Family

## 2019-03-04 DIAGNOSIS — J309 Allergic rhinitis, unspecified: Secondary | ICD-10-CM

## 2019-03-10 ENCOUNTER — Other Ambulatory Visit: Payer: Self-pay | Admitting: Internal Medicine

## 2019-03-14 ENCOUNTER — Encounter: Payer: Self-pay | Admitting: Family

## 2019-03-16 ENCOUNTER — Ambulatory Visit (INDEPENDENT_AMBULATORY_CARE_PROVIDER_SITE_OTHER): Payer: PPO | Admitting: Family

## 2019-03-16 DIAGNOSIS — J209 Acute bronchitis, unspecified: Secondary | ICD-10-CM

## 2019-03-16 DIAGNOSIS — J019 Acute sinusitis, unspecified: Secondary | ICD-10-CM | POA: Diagnosis not present

## 2019-03-16 MED ORDER — AMOXICILLIN-POT CLAVULANATE 875-125 MG PO TABS: 1.0000 | ORAL_TABLET | Freq: Two times a day (BID) | ORAL | 0 refills | Status: AC

## 2019-03-16 NOTE — Progress Notes (Signed)
[name] [name] is a 59 y.o. female with the following history as recorded in EpicCare:  Patient Active Problem List   Diagnosis Date Noted  . COPD (chronic obstructive pulmonary disease) ([place]) 11/08/2017  . Iron overload 09/22/2017  . Secondary hemochromatosis 09/22/2017  . Allergic rhinitis 09/05/2017  . Hemoptysis 05/26/2017  . Pulmonary nodule, right 11/29/2016  . Polyarthralgia 12/02/2015  . Varicose veins of both lower extremities 12/02/2015  . Hypertensive retinopathy of both eyes 11/24/2015  . Elevated liver enzymes 10/31/2015  . Hyperglycemia 10/31/2015  . SIRS (systemic inflammatory response syndrome) ([place]) 03/11/2013  . Hypertension   . Hyperlipidemia   . Anemia   . Hematochezia 08/14/2012  . ETOH abuse 08/14/2012  . Pseudocyst of pancreas 08/12/2012  . Acute pancreatitis 08/12/2012  . GERD (gastroesophageal reflux disease) 06/08/2012  . Rectal fissure 05/11/2011  . Rectal fistula 05/11/2011  . Hiatal hernia 04/28/2011  . Gastritis 04/28/2011  . Nausea 04/27/2011  . Acute epigastric pain 04/27/2011  . Obesity, Class III, BMI 40-49.9 (morbid obesity) ([place]) 04/27/2011  . Solitary rectal ulcer 04/12/2011  . Hemorrhoids 03/10/2011  . Ulcerative proctitis, nonspecific ([place]) 03/10/2011  . DIVERTICULOSIS, COLON 10/22/2009  . COLONIC POLYPS, HYPERPLASTIC, HX OF 10/22/2009  . INFECTIOUS DIARRHEA 09/27/2008  . ULCERATIVE PROCTITIS 09/27/2008  . NAUSEA AND VOMITING 09/27/2008  . NAUSEA ALONE 09/27/2008  . DIARRHEA 09/27/2008  . ABDOMINAL PAIN -GENERALIZED 09/27/2008  . DEPRESSION 09/12/2007  . HEMORRHOIDS 09/12/2007  . HIATAL HERNIA 09/12/2007  . ULCERATIVE COLITIS 09/12/2007  . RECTAL PAIN 09/12/2007  . BIPOLAR AFFECTIVE DISORDER 12/06/2006  . TOBACCO ABUSE 12/06/2006  . Esophageal reflux 12/06/2006  . Cough 12/06/2006    Current Outpatient Medications  Medication Sig Dispense Refill  . fluticasone (FLONASE) 50 MCG/ACT nasal spray SPRAY TWO SPRAYS IN EACH  NOSTRIL ONCE DAILY 48 g 2  . amoxicillin-clavulanate (AUGMENTIN) 875-125 MG tablet Take 1 tablet by mouth 2 (two) times daily for 10 days. 20 tablet 0  . ARIPiprazole (ABILIFY) 30 MG tablet     . atorvastatin (LIPITOR) 40 MG tablet TAKE ONE TABLET BY MOUTH DAILY AT 6PM AS DIRECTED 90 tablet 0  . benzonatate (TESSALON) 200 MG capsule TAKE ONE CAPSULE BY MOUTH EVERY 6 HOURS AS NEEDED FOR COUGH 20 capsule 0  . blood glucose meter kit and supplies KIT Dispense based on patient and insurance preference. Use up to four times daily as directed. (FOR ICD-9 250.00, 250.01). 1 each 0  . DEXILANT 60 MG capsule TAKE ONE CAPSULE BY MOUTH DAILY 30 capsule 5  . diclofenac sodium (VOLTAREN) 1 % GEL Apply 2 g topically 4 (four) times daily.     Marland Kitchen diltiazem 2 % GEL Apply 1 application topically 5 (five) times daily. 30 g 3  . FLUoxetine (PROZAC) 20 MG capsule Take 20 mg by mouth 2 (two) times daily.     Marland Kitchen glucose blood (ONE TOUCH ULTRA TEST) test strip     . HYDROcodone-acetaminophen (NORCO/VICODIN) 5-325 MG tablet Take 1 tablet by mouth 4 (four) times daily as needed for moderate pain.     Marland Kitchen lamoTRIgine (LAMICTAL) 100 MG tablet Take 100 mg by mouth 2 (two) times daily.     . Lancets (ONETOUCH DELICA PLUS [id number]) MISC USE TO TEST BLOOD SUGAR UP TO FOUR TIMES A DAY AS DIRECTED 100 each 0  . methocarbamol (ROBAXIN) 500 MG tablet Take 500 mg by mouth 2 (two) times daily as needed for muscle spasms.     . Multiple Vitamin (MULTIVITAMIN WITH MINERALS)  TABS tablet Take 1 tablet by mouth daily. 30 tablet 0  . NARCAN 4 MG/0.1ML LIQD nasal spray kit Place 0.4 mg into the nose once.     . promethazine (PHENERGAN) 25 MG suppository INSERT ONE SUPPOSITORY RECTALLY EVERY 6 HOURS AS NEEDED FOR NAUSEA AND VOMITING 30 suppository 2  . Tetrahydrozoline HCl (VISINE OP) Place 1-2 drops into both eyes daily.    . traMADol (ULTRAM) 50 MG tablet      No current facility-administered medications for this visit.    Allergies:  Patient has no known allergies.  Past Medical History:  Diagnosis Date  . Anal fistula   . Anemia   . Anxiety   . Arthritis   . Bipolar disorder ([place])   . Chronic kidney disease   . Depressive disorder, not elsewhere classified   . Diverticulosis of colon (without mention of hemorrhage)   . Esophageal reflux   . Fatty liver   . GERD (gastroesophageal reflux disease)   . Hepatic steatosis   . Hiatal hernia   . History of recurrent UTIs   . Hyperlipidemia   . Hypertension   . Iron deficiency   . Obesity   . Pancreatitis   . Pancreatitis   . Personal history of colonic polyps 10/16/2009   hyperplastic  . Pre-diabetes   . Seizures ([place])   . Ulcerative colitis, unspecified     Past Surgical History:  Procedure Laterality Date  . FINGER AMPUTATION  2010   right index  . HEMORROIDECTOMY    . KNEE ARTHROSCOPY     bilateral, left x2  . TOTAL ABDOMINAL HYSTERECTOMY    . VIDEO BRONCHOSCOPY Bilateral 05/31/2017   Procedure: VIDEO BRONCHOSCOPY WITHOUT FLUORO;  Surgeon: Collene Gobble, MD;  Location: [facility];  Service: Cardiopulmonary;  Laterality: Bilateral;    Family History  Problem Relation Age of Onset  . Irritable bowel syndrome Mother   . Hypertension Mother   . Atrial fibrillation Mother   . Ovarian cancer Maternal Aunt   . Diabetes Father   . Heart attack Father   . Hypertension Father   . Hyperlipidemia Father   . Thyroid disease Sister   . Hyperlipidemia Sister   . Thyroid disease Brother   . Colon polyps Brother   . Stomach cancer Neg Hx   . Colon cancer Neg Hx     Social History   Tobacco Use  . Smoking status: Current Every Day Smoker    Packs/day: 0.25    Types: Cigars, Cigarettes  . Smokeless tobacco: Never Used  . Tobacco comment: pt states she smokes 3 cigarettes/ day. Trying to quit. Taking Chantix  Substance Use Topics  . Alcohol use: Yes    Alcohol/week: 1.0 standard drinks    Types: 1 Shots of liquor per week    Comment: occ     Subjective:    I connected with [name] [name] on 03/16/19 at 10:00 AM EST by a telephone application and verified that I am speaking with the correct person using two identifiers.   I discussed the limitations of evaluation and management by telemedicine and the availability of in person appointments. The patient expressed understanding and agreed to proceed. Provider in office/ patient is at home; provider and patient are only 2 people on telephone call.  Patient is concerned that she has a sore throat; she was unable to do a virtual visit today; per patient, she has been noticing increased drainage in the back of throat; started with vertigo type  symptoms- then progressed into ear and sore throat;  No fever, no shortness of breath or chest pain; + daily smoker- uses vapes;     Objective:  There were no vitals filed for this visit.  General: Well developed, well nourished, in no acute distress  Lungs: Respirations unlabored;  Neurologic: Alert and oriented; speech intact;   Assessment:  1. Acute sinusitis, recurrence not specified, unspecified location   2. Acute bronchitis, unspecified organism     Plan:  Rx for Augmentin 875 mg bid x 10 days; continue Flonase; increase fluids, rest; stressed need to stop vaping; follow-up worse, no better.   Time spent 8 minutes  No follow-ups on file.  No orders of the defined types were placed in this encounter.   Requested Prescriptions   Signed Prescriptions Disp Refills  . amoxicillin-clavulanate (AUGMENTIN) 875-125 MG tablet 20 tablet 0    Sig: Take 1 tablet by mouth 2 (two) times daily for 10 days.

## 2019-03-22 ENCOUNTER — Encounter: Payer: Self-pay | Admitting: Family

## 2019-03-27 ENCOUNTER — Encounter: Payer: Self-pay | Admitting: Family

## 2019-03-27 ENCOUNTER — Other Ambulatory Visit: Payer: Self-pay

## 2019-03-27 ENCOUNTER — Ambulatory Visit (INDEPENDENT_AMBULATORY_CARE_PROVIDER_SITE_OTHER): Payer: PPO | Admitting: Family

## 2019-03-27 VITALS — BP 122/76 | HR 91 | Temp 98.3°F | Ht 63.5 in | Wt 204.8 lb

## 2019-03-27 DIAGNOSIS — R748 Abnormal levels of other serum enzymes: Secondary | ICD-10-CM | POA: Diagnosis not present

## 2019-03-27 DIAGNOSIS — R053 Chronic cough: Secondary | ICD-10-CM

## 2019-03-27 DIAGNOSIS — R059 Cough, unspecified: Secondary | ICD-10-CM

## 2019-03-27 DIAGNOSIS — F1010 Alcohol abuse, uncomplicated: Secondary | ICD-10-CM

## 2019-03-27 DIAGNOSIS — R05 Cough: Secondary | ICD-10-CM | POA: Diagnosis not present

## 2019-03-27 DIAGNOSIS — J312 Chronic pharyngitis: Secondary | ICD-10-CM | POA: Diagnosis not present

## 2019-03-27 DIAGNOSIS — H6592 Unspecified nonsuppurative otitis media, left ear: Secondary | ICD-10-CM

## 2019-03-27 DIAGNOSIS — R7303 Prediabetes: Secondary | ICD-10-CM | POA: Diagnosis not present

## 2019-03-27 LAB — CBC WITH DIFFERENTIAL/PLATELET
Basophils Absolute: 0.1 10*3/uL (ref 0.0–0.1)
Basophils Relative: 1.2 % (ref 0.0–3.0)
Eosinophils Absolute: 0.1 10*3/uL (ref 0.0–0.7)
Eosinophils Relative: 3.2 % (ref 0.0–5.0)
HCT: 34.7 % — ABNORMAL LOW (ref 36.0–46.0)
Hemoglobin: 11.4 g/dL — ABNORMAL LOW (ref 12.0–15.0)
Lymphocytes Relative: 49.1 % — ABNORMAL HIGH (ref 12.0–46.0)
Lymphs Abs: 2.2 10*3/uL (ref 0.7–4.0)
MCHC: 32.8 g/dL (ref 30.0–36.0)
MCV: 106.4 fl — ABNORMAL HIGH (ref 78.0–100.0)
Monocytes Absolute: 0.5 10*3/uL (ref 0.1–1.0)
Monocytes Relative: 11.9 % (ref 3.0–12.0)
Neutro Abs: 1.6 10*3/uL (ref 1.4–7.7)
Neutrophils Relative %: 34.6 % — ABNORMAL LOW (ref 43.0–77.0)
Platelets: 163 10*3/uL (ref 150.0–400.0)
RBC: 3.26 Mil/uL — ABNORMAL LOW (ref 3.87–5.11)
RDW: 14.2 % (ref 11.5–15.5)
WBC: 4.5 10*3/uL (ref 4.0–10.5)

## 2019-03-27 LAB — COMPREHENSIVE METABOLIC PANEL
ALT: 44 U/L — ABNORMAL HIGH (ref 0–35)
AST: 164 U/L — ABNORMAL HIGH (ref 0–37)
Albumin: 4 g/dL (ref 3.5–5.2)
Alkaline Phosphatase: 158 U/L — ABNORMAL HIGH (ref 39–117)
BUN: 9 mg/dL (ref 6–23)
CO2: 27 mEq/L (ref 19–32)
Calcium: 9.3 mg/dL (ref 8.4–10.5)
Chloride: 94 mEq/L — ABNORMAL LOW (ref 96–112)
Creatinine, Ser: 0.71 mg/dL (ref 0.40–1.20)
GFR: 84.5 mL/min (ref >60.00)
Glucose, Bld: 124 mg/dL — ABNORMAL HIGH (ref 70–99)
Potassium: 3.7 mEq/L (ref 3.5–5.1)
Sodium: 133 mEq/L — ABNORMAL LOW (ref 135–145)
Total Bilirubin: 0.9 mg/dL (ref 0.2–1.2)
Total Protein: 7.8 g/dL (ref 6.0–8.3)

## 2019-03-27 LAB — HEMOGLOBIN A1C: Hgb A1c MFr Bld: 5.1 % (ref 4.6–6.5)

## 2019-03-27 MED ORDER — PREDNISONE 20 MG PO TABS: 20.0000 mg | ORAL_TABLET | Freq: Every day | ORAL | 0 refills | Status: DC

## 2019-03-27 NOTE — Progress Notes (Signed)
[name] [name] is a 59 y.o. female with the following history as recorded in EpicCare:  Patient Active Problem List   Diagnosis Date Noted  . COPD (chronic obstructive pulmonary disease) ([place]) 11/08/2017  . Iron overload 09/22/2017  . Secondary hemochromatosis 09/22/2017  . Allergic rhinitis 09/05/2017  . Hemoptysis 05/26/2017  . Pulmonary nodule, right 11/29/2016  . Polyarthralgia 12/02/2015  . Varicose veins of both lower extremities 12/02/2015  . Hypertensive retinopathy of both eyes 11/24/2015  . Elevated liver enzymes 10/31/2015  . Hyperglycemia 10/31/2015  . SIRS (systemic inflammatory response syndrome) ([place]) 03/11/2013  . Hypertension   . Hyperlipidemia   . Anemia   . Hematochezia 08/14/2012  . ETOH abuse 08/14/2012  . Pseudocyst of pancreas 08/12/2012  . Acute pancreatitis 08/12/2012  . GERD (gastroesophageal reflux disease) 06/08/2012  . Rectal fissure 05/11/2011  . Rectal fistula 05/11/2011  . Hiatal hernia 04/28/2011  . Gastritis 04/28/2011  . Nausea 04/27/2011  . Acute epigastric pain 04/27/2011  . Obesity, Class III, BMI 40-49.9 (morbid obesity) ([place]) 04/27/2011  . Solitary rectal ulcer 04/12/2011  . Hemorrhoids 03/10/2011  . Ulcerative proctitis, nonspecific ([place]) 03/10/2011  . DIVERTICULOSIS, COLON 10/22/2009  . COLONIC POLYPS, HYPERPLASTIC, HX OF 10/22/2009  . INFECTIOUS DIARRHEA 09/27/2008  . ULCERATIVE PROCTITIS 09/27/2008  . NAUSEA AND VOMITING 09/27/2008  . NAUSEA ALONE 09/27/2008  . DIARRHEA 09/27/2008  . ABDOMINAL PAIN -GENERALIZED 09/27/2008  . DEPRESSION 09/12/2007  . HEMORRHOIDS 09/12/2007  . HIATAL HERNIA 09/12/2007  . ULCERATIVE COLITIS 09/12/2007  . RECTAL PAIN 09/12/2007  . BIPOLAR AFFECTIVE DISORDER 12/06/2006  . TOBACCO ABUSE 12/06/2006  . Esophageal reflux 12/06/2006  . Cough 12/06/2006    Current Outpatient Medications  Medication Sig Dispense Refill  . ARIPiprazole (ABILIFY) 30 MG tablet     . atorvastatin (LIPITOR) 40 MG  tablet TAKE ONE TABLET BY MOUTH DAILY AT 6PM AS DIRECTED 90 tablet 0  . benzonatate (TESSALON) 200 MG capsule TAKE ONE CAPSULE BY MOUTH EVERY 6 HOURS AS NEEDED FOR COUGH 20 capsule 0  . blood glucose meter kit and supplies KIT Dispense based on patient and insurance preference. Use up to four times daily as directed. (FOR ICD-9 250.00, 250.01). 1 each 0  . DEXILANT 60 MG capsule TAKE ONE CAPSULE BY MOUTH DAILY 30 capsule 5  . diclofenac sodium (VOLTAREN) 1 % GEL Apply 2 g topically 4 (four) times daily.     Marland Kitchen diltiazem 2 % GEL Apply 1 application topically 5 (five) times daily. 30 g 3  . FLUoxetine (PROZAC) 20 MG capsule Take 20 mg by mouth 2 (two) times daily.     . fluticasone (FLONASE) 50 MCG/ACT nasal spray SPRAY TWO SPRAYS IN EACH NOSTRIL ONCE DAILY 48 g 2  . glucose blood (ONE TOUCH ULTRA TEST) test strip     . HYDROcodone-acetaminophen (NORCO/VICODIN) 5-325 MG tablet Take 1 tablet by mouth 4 (four) times daily as needed for moderate pain.     Marland Kitchen lamoTRIgine (LAMICTAL) 100 MG tablet Take 100 mg by mouth 2 (two) times daily.     . Lancets (ONETOUCH DELICA PLUS [id number]) MISC USE TO TEST BLOOD SUGAR UP TO FOUR TIMES A DAY AS DIRECTED 100 each 0  . methocarbamol (ROBAXIN) 500 MG tablet Take 500 mg by mouth 2 (two) times daily as needed for muscle spasms.     . Multiple Vitamin (MULTIVITAMIN WITH MINERALS) TABS tablet Take 1 tablet by mouth daily. 30 tablet 0  . NARCAN 4 MG/0.1ML LIQD nasal spray kit Place 0.4  mg into the nose once.     . promethazine (PHENERGAN) 25 MG suppository INSERT ONE SUPPOSITORY RECTALLY EVERY 6 HOURS AS NEEDED FOR NAUSEA AND VOMITING 30 suppository 2  . Tetrahydrozoline HCl (VISINE OP) Place 1-2 drops into both eyes daily.    . traMADol (ULTRAM) 50 MG tablet     . predniSONE (DELTASONE) 20 MG tablet Take 1 tablet (20 mg total) by mouth daily with breakfast. 5 tablet 0   No current facility-administered medications for this visit.    Allergies: Patient has no known  allergies.  Past Medical History:  Diagnosis Date  . Anal fistula   . Anemia   . Anxiety   . Arthritis   . Bipolar disorder ([place])   . Chronic kidney disease   . Depressive disorder, not elsewhere classified   . Diverticulosis of colon (without mention of hemorrhage)   . Esophageal reflux   . Fatty liver   . GERD (gastroesophageal reflux disease)   . Hepatic steatosis   . Hiatal hernia   . History of recurrent UTIs   . Hyperlipidemia   . Hypertension   . Iron deficiency   . Obesity   . Pancreatitis   . Pancreatitis   . Personal history of colonic polyps 10/16/2009   hyperplastic  . Pre-diabetes   . Seizures ([place])   . Ulcerative colitis, unspecified     Past Surgical History:  Procedure Laterality Date  . FINGER AMPUTATION  2010   right index  . HEMORROIDECTOMY    . KNEE ARTHROSCOPY     bilateral, left x2  . TOTAL ABDOMINAL HYSTERECTOMY    . VIDEO BRONCHOSCOPY Bilateral 05/31/2017   Procedure: VIDEO BRONCHOSCOPY WITHOUT FLUORO;  Surgeon: Collene Gobble, MD;  Location: [facility];  Service: Cardiopulmonary;  Laterality: Bilateral;    Family History  Problem Relation Age of Onset  . Irritable bowel syndrome Mother   . Hypertension Mother   . Atrial fibrillation Mother   . Ovarian cancer Maternal Aunt   . Diabetes Father   . Heart attack Father   . Hypertension Father   . Hyperlipidemia Father   . Thyroid disease Sister   . Hyperlipidemia Sister   . Thyroid disease Brother   . Colon polyps Brother   . Stomach cancer Neg Hx   . Colon cancer Neg Hx     Social History   Tobacco Use  . Smoking status: Current Every Day Smoker    Packs/day: 0.25    Types: Cigars, Cigarettes  . Smokeless tobacco: Never Used  . Tobacco comment: pt states she smokes 3 cigarettes/ day. Trying to quit. Taking Chantix  Substance Use Topics  . Alcohol use: Yes    Alcohol/week: 1.0 standard drinks    Types: 1 Shots of liquor per week    Comment: occ    Subjective:  Presents in  office with concerns for persisting sore throat; had telephone visit on 03/16/2019 and was treated with Augmentin for possible sinus infection but symptoms have persisted; notes that her ears continue to "feel clogged." Per patient, the back of her throat "has lots of red spots" and just feels irritated;   Feels that her sore throat/ abnormal appearance to her throat is correlated with use of vaping; admits that she has been drinking more in the past month; She is overdue for yearly chest CT that pulmonology has been ordering for her; She is overdue for labs to monitor her iron/ liver studies; history of cirrhosis;   Objective:  Vitals:   03/27/19 1011  BP: 122/76  Pulse: 91  Temp: 98.3 F (36.8 C)  TempSrc: Oral  SpO2: 98%  Weight: 204 lb 12.8 oz (92.9 kg)  Height: 5' 3.5" (1.613 m)    General: Well developed, well nourished, in no acute distress  Skin : Warm and dry.  Head: Normocephalic and atraumatic  Eyes: Sclera and conjunctiva clear; pupils round and reactive to light; extraocular movements intact  Ears: External normal; canals clear; tympanic membranes congested/full Oropharynx: Pink, supple. Macular, erythematous changes noted throughout entire mouth/ posterior pharynx- vascular appearance Neck: Supple without thyromegaly, adenopathy  Lungs: Respirations unlabored; clear to auscultation bilaterally without wheeze, rales, rhonchi  CVS exam: normal rate and regular rhythm.  Neurologic: Alert and oriented; speech intact; face symmetrical; moves all extremities well; CNII-XII intact without focal deficit   Assessment:  1. Chronic cough   2. Secondary hemochromatosis   3. Pre-diabetes   4. Chronic sore throat   5. Cough   6. Elevated liver enzymes   7. ETOH abuse   8. Left serous otitis media, unspecified chronicity     Plan:  1. Will update yearly chest CT; patient is encouraged to stop smoking and vaping; follow-up to be determined; 2. Update iron studies today; may need  to refer back to hematology; 3. Check Hgba1c today;  4. ? Vascular source; refer to ENT for further evaluation; stressed need to quit smoking;  6. Update labs today; stressed need to quit drinking; patient needs to go back to see her liver specialist.  8. Finish Augmentin that was previously prescribed; add Prednisone x 5 days; if symptoms persist, can discuss further with ENT.   This visit occurred during the SARS-CoV-2 public health emergency.  Safety protocols were in place, including screening questions prior to the visit, additional usage of staff PPE, and extensive cleaning of exam room while observing appropriate contact time as indicated for disinfecting solutions.     No follow-ups on file.  Orders Placed This Encounter  Procedures  . CT Chest Wo Contrast    Standing Status:   Future    Standing Expiration Date:   05/24/2020    Order Specific Question:   Is patient pregnant?    Answer:   No    Order Specific Question:   Preferred imaging location?    Answer:   [id number]    Order Specific Question:   Radiology Contrast Protocol - do NOT remove file path    Answer:   [email or web address]  . CBC w/Diff  . Comprehensive metabolic panel  . HgB A1c  . Iron, TIBC and Ferritin Panel  . Ambulatory referral to ENT    Referral Priority:   Routine    Referral Type:   Consultation    Referral Reason:   Specialty Services Required    Requested Specialty:   Otolaryngology    Number of Visits Requested:   1    Requested Prescriptions   Signed Prescriptions Disp Refills  . predniSONE (DELTASONE) 20 MG tablet 5 tablet 0    Sig: Take 1 tablet (20 mg total) by mouth daily with breakfast.

## 2019-03-28 DIAGNOSIS — M545 Low back pain: Secondary | ICD-10-CM | POA: Diagnosis not present

## 2019-03-28 DIAGNOSIS — G894 Chronic pain syndrome: Secondary | ICD-10-CM | POA: Diagnosis not present

## 2019-03-28 LAB — IRON,TIBC AND FERRITIN PANEL
%SAT: 22 % (calc) (ref 16–45)
Ferritin: 80 ng/mL (ref 16–232)
Iron: 84 ug/dL (ref 45–160)
TIBC: 378 mcg/dL (calc) (ref 250–450)

## 2019-03-29 ENCOUNTER — Encounter: Payer: Self-pay | Admitting: Family

## 2019-04-04 ENCOUNTER — Other Ambulatory Visit: Payer: Self-pay

## 2019-04-04 ENCOUNTER — Ambulatory Visit
Admission: RE | Admit: 2019-04-04 | Discharge: 2019-04-04 | Disposition: A | Discharge status: Home / Self Care | Payer: PPO | Source: Ambulatory Visit | Attending: Family | Admitting: Family

## 2019-04-04 DIAGNOSIS — R059 Cough, unspecified: Secondary | ICD-10-CM

## 2019-04-04 DIAGNOSIS — R918 Other nonspecific abnormal finding of lung field: Secondary | ICD-10-CM | POA: Diagnosis not present

## 2019-04-04 DIAGNOSIS — R05 Cough: Secondary | ICD-10-CM

## 2019-04-05 ENCOUNTER — Encounter: Payer: Self-pay | Admitting: Family

## 2019-04-06 ENCOUNTER — Encounter: Payer: Self-pay | Admitting: Family

## 2019-04-06 ENCOUNTER — Ambulatory Visit (INDEPENDENT_AMBULATORY_CARE_PROVIDER_SITE_OTHER): Payer: PPO | Admitting: Otolaryngology

## 2019-04-06 ENCOUNTER — Other Ambulatory Visit: Payer: Self-pay

## 2019-04-06 VITALS — Temp 97.9°F

## 2019-04-06 DIAGNOSIS — J029 Acute pharyngitis, unspecified: Secondary | ICD-10-CM

## 2019-04-06 DIAGNOSIS — J310 Chronic rhinitis: Secondary | ICD-10-CM | POA: Diagnosis not present

## 2019-04-06 NOTE — Progress Notes (Signed)
HPI: [name] [name] is a 59 y.o. female who presents is referred by her PCP for evaluation of throat, nasal and ear complaints.  This began initially about 3 or 4 weeks ago with a chronic sore throat.  She described blockage of her hearing and pressure in her head and over the bridge of her nose.  Initially she also noted some spots in the roof of her mouth.  She has been a chronic smoker but only about a third a pack a day.  About a month ago she quit smoking and started vaping. She was seen in the office about 10 days ago and treated with a course of Augmentin.  She is actually doing better today.. Patient presently uses Afrin almost every day as well as Flonase in the mornings.  Past Medical History:  Diagnosis Date  . Anal fistula   . Anemia   . Anxiety   . Arthritis   . Bipolar disorder ([place])   . Chronic kidney disease   . Depressive disorder, not elsewhere classified   . Diverticulosis of colon (without mention of hemorrhage)   . Esophageal reflux   . Fatty liver   . GERD (gastroesophageal reflux disease)   . Hepatic steatosis   . Hiatal hernia   . History of recurrent UTIs   . Hyperlipidemia   . Hypertension   . Iron deficiency   . Obesity   . Pancreatitis   . Pancreatitis   . Personal history of colonic polyps 10/16/2009   hyperplastic  . Pre-diabetes   . Seizures ([place])   . Ulcerative colitis, unspecified    Past Surgical History:  Procedure Laterality Date  . FINGER AMPUTATION  2010   right index  . HEMORROIDECTOMY    . KNEE ARTHROSCOPY     bilateral, left x2  . TOTAL ABDOMINAL HYSTERECTOMY    . VIDEO BRONCHOSCOPY Bilateral 05/31/2017   Procedure: VIDEO BRONCHOSCOPY WITHOUT FLUORO;  Surgeon: Collene Gobble, MD;  Location: [facility];  Service: Cardiopulmonary;  Laterality: Bilateral;   Social History   Socioeconomic History  . Marital status: Single    Spouse name: Not on file  . Number of children: 0  . Years of education: 2  . Highest education level: Not  on file  Occupational History  . Occupation: produce Armed forces operational officer: UNEMPLOYED  Tobacco Use  . Smoking status: Current Every Day Smoker    Packs/day: 0.25    Types: Cigars, Cigarettes  . Smokeless tobacco: Never Used  . Tobacco comment: pt states she smokes 3 cigarettes/ day. Trying to quit. Taking Chantix  Substance and Sexual Activity  . Alcohol use: Yes    Alcohol/week: 1.0 standard drinks    Types: 1 Shots of liquor per week    Comment: occ  . Drug use: No  . Sexual activity: Not Currently  Other Topics Concern  . Not on file  Social History Narrative  . Not on file   Social Determinants of Health   Financial Resource Strain:   . Difficulty of Paying Living Expenses: Not on file  Food Insecurity:   . Worried About Charity fundraiser in the Last Year: Not on file  . Ran Out of Food in the Last Year: Not on file  Transportation Needs:   . Lack of Transportation (Medical): Not on file  . Lack of Transportation (Non-Medical): Not on file  Physical Activity:   . Days of Exercise per Week: Not on file  . Minutes of  Exercise per Session: Not on file  Stress:   . Feeling of Stress : Not on file  Social Connections:   . Frequency of Communication with Friends and Family: Not on file  . Frequency of Social Gatherings with Friends and Family: Not on file  . Attends Religious Services: Not on file  . Active Member of Clubs or Organizations: Not on file  . Attends Archivist Meetings: Not on file  . Marital Status: Not on file   Family History  Problem Relation Age of Onset  . Irritable bowel syndrome Mother   . Hypertension Mother   . Atrial fibrillation Mother   . Ovarian cancer Maternal Aunt   . Diabetes Father   . Heart attack Father   . Hypertension Father   . Hyperlipidemia Father   . Thyroid disease Sister   . Hyperlipidemia Sister   . Thyroid disease Brother   . Colon polyps Brother   . Stomach cancer Neg Hx   . Colon cancer Neg Hx    No  Known Allergies Prior to Admission medications   Medication Sig Start Date End Date Taking? Authorizing Provider  ARIPiprazole (ABILIFY) 30 MG tablet  08/07/17  Yes [provider]  atorvastatin (LIPITOR) 40 MG tablet TAKE ONE TABLET BY MOUTH DAILY AT 6PM AS DIRECTED 03/02/19  Yes [name], FNP  benzonatate (TESSALON) 200 MG capsule TAKE ONE CAPSULE BY MOUTH EVERY 6 HOURS AS NEEDED FOR COUGH 01/30/18  Yes Byrum, Rose Fillers, MD  blood glucose meter kit and supplies KIT Dispense based on patient and insurance preference. Use up to four times daily as directed. (FOR ICD-9 250.00, 250.01). 04/05/18  Yes [name], FNP  DEXILANT 60 MG capsule TAKE ONE CAPSULE BY MOUTH DAILY 04/10/18  Yes Irene Shipper, MD  diclofenac sodium (VOLTAREN) 1 % GEL Apply 2 g topically 4 (four) times daily.  10/24/15  Yes [provider]  diltiazem 2 % GEL Apply 1 application topically 5 (five) times daily. 10/05/18  Yes Irene Shipper, MD  FLUoxetine (PROZAC) 20 MG capsule Take 20 mg by mouth 2 (two) times daily.  12/01/16  Yes [provider]  fluticasone (FLONASE) 50 MCG/ACT nasal spray SPRAY TWO SPRAYS IN EACH NOSTRIL ONCE DAILY 03/05/19  Yes [name], FNP  glucose blood (ONE TOUCH ULTRA TEST) test strip  04/06/18  Yes [provider]  HYDROcodone-acetaminophen (NORCO/VICODIN) 5-325 MG tablet Take 1 tablet by mouth 4 (four) times daily as needed for moderate pain.    Yes [provider]  lamoTRIgine (LAMICTAL) 100 MG tablet Take 100 mg by mouth 2 (two) times daily.    Yes [provider]  Lancets (ONETOUCH DELICA PLUS [id number]) MISC USE TO TEST BLOOD SUGAR UP TO FOUR TIMES A DAY AS DIRECTED 09/18/18  Yes [name], FNP  methocarbamol (ROBAXIN) 500 MG tablet Take 500 mg by mouth 2 (two) times daily as needed for muscle spasms.  06/23/16  Yes [provider]  Multiple Vitamin (MULTIVITAMIN WITH MINERALS) TABS tablet Take 1  tablet by mouth daily. 03/15/13  Yes Velvet Bathe, MD  NARCAN 4 MG/0.1ML LIQD nasal spray kit Place 0.4 mg into the nose once.  04/20/17  Yes [provider]  predniSONE (DELTASONE) 20 MG tablet Take 1 tablet (20 mg total) by mouth daily with breakfast. 03/27/19  Yes [name], FNP  promethazine (PHENERGAN) 25 MG suppository INSERT ONE SUPPOSITORY RECTALLY EVERY 6 HOURS AS NEEDED FOR NAUSEA AND  VOMITING 03/12/19  Yes Irene Shipper, MD  Tetrahydrozoline HCl (VISINE OP) Place 1-2 drops into both eyes daily.   Yes [provider]  traMADol Veatrice Bourbon) 50 MG tablet  08/16/18  Yes [provider]     Positive ROS: Otherwise negative  All other systems have been reviewed and were otherwise negative with the exception of those mentioned in the HPI and as above.  Physical Exam: Constitutional: Alert, well-appearing, no acute distress Ears: External ears without lesions or tenderness. Ear canals are clear bilaterally with intact, clear TMs bilaterally.  Hearing screening with a tuning forks revealed good hearing in both ears with AC > BC bilaterally.  No middle ear abnormalities noted on clinical exam with the microscope. Nasal: External nose without lesions. Septum mildly deviated to the right.. Mild rhinitis.  Both middle meatus regions were clear with no signs of infection. Oral: Lips and gums without lesions. Tongue and palate mucosa without lesions. Posterior oropharynx clear.  Mucosal membranes were all clear. Fiberoptic laryngoscopy was performed to the left nostril.  Nasopharynx was clear.  Base of tongue vallecula and epiglottis were normal.  Vocal cords were clear bilaterally.  Both piriform sinuses were clear.  No upper airway abnormalities noted. Neck: No palpable adenopathy or masses Respiratory: Breathing comfortably  Skin: No facial/neck lesions or rash noted.  Laryngoscopy  Date/Time: 04/06/2019 5:00 PM Performed by: Rozetta Nunnery,  MD Authorized by: Rozetta Nunnery, MD   Consent:    Consent obtained:  Verbal   Consent given by:  Patient Procedure details:    Indications: direct visualization of the upper aerodigestive tract     Medication:  Afrin   Scope location: left nare   Mouth:    Oropharynx: normal     Vallecula: normal     Base of tongue: normal     Epiglottis: normal   Throat:    True vocal cords: normal   Comments:     Clear upper airway examination and vocal cord examination on fiberoptic laryngoscopy.    Assessment: Clear upper airway examination.  No signs of infection. Apparently resolved upper airway infection treated with Augmentin. Mild rhinitis.  Plan: Recommended that patient taper herself off of the Afrin use.  Recommended regular use of Flonase 2 sprays each nostril at night. She will follow-up as needed.   Radene Journey, MD   CC:

## 2019-04-11 ENCOUNTER — Ambulatory Visit (INDEPENDENT_AMBULATORY_CARE_PROVIDER_SITE_OTHER): Payer: PPO | Admitting: Otolaryngology

## 2019-04-12 DIAGNOSIS — K7030 Alcoholic cirrhosis of liver without ascites: Secondary | ICD-10-CM | POA: Diagnosis not present

## 2019-04-12 DIAGNOSIS — F1010 Alcohol abuse, uncomplicated: Secondary | ICD-10-CM | POA: Diagnosis not present

## 2019-04-14 ENCOUNTER — Encounter: Payer: Self-pay | Admitting: Family

## 2019-04-16 NOTE — Progress Notes (Deleted)
Cardiology Office Note:    Date:  04/16/2019   ID:  [name], DOB May 20, 1960, MRN [id number]  PCP:  [name], FNP  Cardiologist:  Sinclair Grooms, MD   Referring MD: [name],*   No chief complaint on file.   History of Present Illness:    [name] [name] is a 59 y.o. female with a hx of ETOH cirrhosis referred by Dr. Mindi Slicker for evaluation of palpitations. Prior pertinent history includes bipolar disorder, hypertension, tobacco use, and hyperlipidemia.  ***  Past Medical History:  Diagnosis Date  . ABDOMINAL PAIN -GENERALIZED 09/27/2008   Qualifier: Diagnosis of  By: Trellis Paganini PA-c, Amy S   . Acute epigastric pain 04/27/2011  . Acute pancreatitis 08/12/2012  . Allergic rhinitis 09/05/2017  . Anal fistula   . Anemia   . Anxiety   . Arthritis   . BIPOLAR AFFECTIVE DISORDER 12/06/2006   Qualifier: History of  By: Elsworth Soho MD, Leanna Sato Bipolar disorder ([place])   . Chronic kidney disease   . COPD (chronic obstructive pulmonary disease) ([place]) 11/08/2017  . Cough 12/06/2006   Annotation: chronic since'96, nml spirometry, +ve methacholine challenge, nml  CT sinuses, inconclusive pH probe, esophageal manometry Centricity Description: SYMPTOM, COUGH Qualifier: History of  By: Elsworth Soho MD, Leanna Sato   Centricity Description: COUGH Qualifier: Diagnosis of  By: Elsworth Soho MD, Leanna Sato    . Depressive disorder, not elsewhere classified   . Diarrhea 09/27/2008   Qualifier: Diagnosis of  By: Trellis Paganini PA-c, Amy S   . Diverticulosis of colon (without mention of hemorrhage)   . Elevated liver enzymes 10/31/2015  . Esophageal reflux   . ETOH abuse 08/14/2012  . Fatty liver   . Gastritis 04/28/2011  . GERD (gastroesophageal reflux disease)   . Hematochezia 08/14/2012  . Hemoptysis 05/26/2017  . HEMORRHOIDS 09/12/2007   Qualifier: Diagnosis of  By: Nils Pyle CMA ([place]), Mearl Latin    . Hepatic steatosis   . Hiatal hernia   . HIATAL HERNIA 09/12/2007   Qualifier: Diagnosis of  By:  Nils Pyle CMA ([place]), Mearl Latin    . History of recurrent UTIs   . Hyperlipidemia   . Hypertension   . Hypertensive retinopathy of both eyes 11/24/2015  . INFECTIOUS DIARRHEA 09/27/2008   Qualifier: Diagnosis of  By: Laney Potash, [place]    . Iron deficiency   . Iron overload 09/22/2017  . Nausea alone 09/27/2008   Qualifier: Diagnosis of  By: Laney Potash, [place] 09/27/2008   Qualifier: Diagnosis of  By: Shane Crutch, Amy S   . Obesity   . Obesity, Class III, BMI 40-49.9 (morbid obesity) ([place]) 04/27/2011  . Pancreatitis   . Pancreatitis   . Personal history of colonic polyps 10/16/2009   hyperplastic  . Polyarthralgia 12/02/2015   Evaluated by Dr. Charlestine Night (rheumatologist) 12/29/2015:1): 1)possible fibromyalgia (Suggest physical therapy and flexeril), 2) Polyarthralgia/Insomnia (suggest use of robaxin or flexeril), 3) Nicotine Dependence (counseled to quit). 4)Depression (continue therapy with psychiatrist)  . Pre-diabetes   . Pseudocyst of pancreas 08/12/2012  . Pulmonary nodule, right 11/29/2016  . Rectal fissure 05/11/2011  . Rectal fistula 05/11/2011  . RECTAL PAIN 09/12/2007   Qualifier: Diagnosis of  By: Nils Pyle CMA ([place]), Mearl Latin    . Secondary hemochromatosis 09/22/2017  . Seizures ([place])   . SIRS (systemic inflammatory response syndrome) ([place]) 03/11/2013  . Solitary rectal ulcer 04/12/2011  . TOBACCO ABUSE 12/06/2006   Qualifier: History of  By: Elsworth Soho MD, Leanna Sato Ulcerative colitis, unspecified   . Ulcerative proctitis, nonspecific ([place]) 03/10/2011  . Varicose veins of both lower extremities 12/02/2015    Past Surgical History:  Procedure Laterality Date  . FINGER AMPUTATION  2010   right index  . HEMORROIDECTOMY    . KNEE ARTHROSCOPY     bilateral, left x2  . TOTAL ABDOMINAL HYSTERECTOMY    . VIDEO BRONCHOSCOPY Bilateral 05/31/2017   Procedure: VIDEO BRONCHOSCOPY WITHOUT FLUORO;  Surgeon: Collene Gobble, MD;  Location: [facility];  Service: Cardiopulmonary;   Laterality: Bilateral;    Current Medications: No outpatient medications have been marked as taking for the 04/17/19 encounter (Appointment) with Belva Crome, MD.     Allergies:   Patient has no known allergies.   Social History   Socioeconomic History  . Marital status: Single    Spouse name: Not on file  . Number of children: 0  . Years of education: 2  . Highest education level: Not on file  Occupational History  . Occupation: produce Armed forces operational officer: UNEMPLOYED  Tobacco Use  . Smoking status: Current Every Day Smoker    Packs/day: 0.25    Types: Cigars, Cigarettes  . Smokeless tobacco: Never Used  . Tobacco comment: pt states she smokes 3 cigarettes/ day. Trying to quit. Taking Chantix  Substance and Sexual Activity  . Alcohol use: Yes    Alcohol/week: 1.0 standard drinks    Types: 1 Shots of liquor per week    Comment: occ  . Drug use: No  . Sexual activity: Not Currently  Other Topics Concern  . Not on file  Social History Narrative  . Not on file   Social Determinants of Health   Financial Resource Strain:   . Difficulty of Paying Living Expenses: Not on file  Food Insecurity:   . Worried About Charity fundraiser in the Last Year: Not on file  . Ran Out of Food in the Last Year: Not on file  Transportation Needs:   . Lack of Transportation (Medical): Not on file  . Lack of Transportation (Non-Medical): Not on file  Physical Activity:   . Days of Exercise per Week: Not on file  . Minutes of Exercise per Session: Not on file  Stress:   . Feeling of Stress : Not on file  Social Connections:   . Frequency of Communication with Friends and Family: Not on file  . Frequency of Social Gatherings with Friends and Family: Not on file  . Attends Religious Services: Not on file  . Active Member of Clubs or Organizations: Not on file  . Attends Archivist Meetings: Not on file  . Marital Status: Not on file     Family History: The patient's  family history includes Atrial fibrillation in her mother; Colon polyps in her brother; Diabetes in her father; Heart attack in her father; Hyperlipidemia in her father and sister; Hypertension in her father and mother; Irritable bowel syndrome in her mother; Ovarian cancer in her maternal aunt; Thyroid disease in her brother and sister. There is no history of Stomach cancer or Colon cancer.  ROS:   Please see the history of present illness.    *** All other systems reviewed and are negative.  EKGs/Labs/Other Studies Reviewed:    The following studies were reviewed today: ***  EKG:  EKG ***  Recent Labs: 09/05/2018: TSH 1.64 03/27/2019: ALT 44; BUN 9; Creatinine, Ser 0.71;  Hemoglobin 11.4; Platelets 163.0; Potassium 3.7; Sodium 133  Recent Lipid Panel    Component Value Date/Time   CHOL 302 (H) 02/07/2018 1013   TRIG 165.0 (H) 02/07/2018 1013   HDL 107.20 02/07/2018 1013   CHOLHDL 3 02/07/2018 1013   VLDL 33.0 02/07/2018 1013   LDLCALC 162 (H) 02/07/2018 1013   LDLDIRECT 192.5 06/08/2012 1106    Physical Exam:    VS:  There were no vitals taken for this visit.    Wt Readings from Last 3 Encounters:  03/27/19 204 lb 12.8 oz (92.9 kg)  10/05/18 192 lb 2 oz (87.1 kg)  09/29/18 192 lb 0.6 oz (87.1 kg)     GEN: ***. No acute distress HEENT: Normal NECK: No JVD. LYMPHATICS: No lymphadenopathy CARDIAC: *** RRR without murmur, gallop, or edema. VASCULAR: *** Normal Pulses. No bruits. RESPIRATORY:  Clear to auscultation without rales, wheezing or rhonchi  ABDOMEN: Soft, non-tender, non-distended, No pulsatile mass, MUSCULOSKELETAL: No deformity  SKIN: Warm and dry NEUROLOGIC:  Alert and oriented x 3 PSYCHIATRIC:  Normal affect   ASSESSMENT:    1. Palpitations   2. TOBACCO ABUSE   3. Mixed hyperlipidemia   4. Essential hypertension   5. Alcoholic cirrhosis of liver without ascites ([place])   6. Educated about COVID-19 virus infection    PLAN:    In order of problems  listed above:  1. ***   Medication Adjustments/Labs and Tests Ordered: Current medicines are reviewed at length with the patient today.  Concerns regarding medicines are outlined above.  No orders of the defined types were placed in this encounter.  No orders of the defined types were placed in this encounter.   There are no Patient Instructions on file for this visit.   Signed, Sinclair Grooms, MD  04/16/2019 5:40 PM    [facility]

## 2019-04-17 ENCOUNTER — Ambulatory Visit: Payer: PPO | Admitting: Interventional Cardiology

## 2019-04-18 ENCOUNTER — Encounter: Payer: Self-pay | Admitting: Family

## 2019-04-24 ENCOUNTER — Ambulatory Visit: Payer: PPO | Attending: Internal Medicine

## 2019-04-24 DIAGNOSIS — Z20822 Contact with and (suspected) exposure to covid-19: Secondary | ICD-10-CM | POA: Diagnosis not present

## 2019-04-25 ENCOUNTER — Encounter: Payer: Self-pay | Admitting: Family

## 2019-04-25 DIAGNOSIS — M545 Low back pain: Secondary | ICD-10-CM | POA: Diagnosis not present

## 2019-04-25 DIAGNOSIS — M5136 Other intervertebral disc degeneration, lumbar region: Secondary | ICD-10-CM | POA: Diagnosis not present

## 2019-04-25 DIAGNOSIS — G894 Chronic pain syndrome: Secondary | ICD-10-CM | POA: Diagnosis not present

## 2019-04-25 DIAGNOSIS — M259 Joint disorder, unspecified: Secondary | ICD-10-CM | POA: Diagnosis not present

## 2019-04-25 LAB — NOVEL CORONAVIRUS, NAA: SARS-CoV-2, NAA: NOT DETECTED

## 2019-04-29 NOTE — Progress Notes (Deleted)
Cardiology Office Note:    Date:  04/29/2019   ID:  [name] [name], DOB [birth year], MRN [id number]  PCP:  [name], FNP  Cardiologist:  Sinclair Grooms, MD   Referring MD: [name],*   No chief complaint on file.   History of Present Illness:    [name] [name] is a 59 y.o. female with a hx of cirrhosis referred for palpitations. Other relevent history includes bipolar affective disorder, COPD, CKD, pre-diabetes, and essential hypertension.  ***  Past Medical History:  Diagnosis Date  . ABDOMINAL PAIN -GENERALIZED 09/27/2008   Qualifier: Diagnosis of  By: Trellis Paganini PA-c, Amy S   . Acute epigastric pain 04/27/2011  . Acute pancreatitis 08/12/2012  . Allergic rhinitis 09/05/2017  . Anal fistula   . Anemia   . Anxiety   . Arthritis   . BIPOLAR AFFECTIVE DISORDER 12/06/2006   Qualifier: History of  By: Elsworth Soho MD, Leanna Sato Bipolar disorder ([place])   . Chronic kidney disease   . COPD (chronic obstructive pulmonary disease) ([place]) 11/08/2017  . Cough 12/06/2006   Annotation: chronic since'96, nml spirometry, +ve methacholine challenge, nml  CT sinuses, inconclusive pH probe, esophageal manometry Centricity Description: SYMPTOM, COUGH Qualifier: History of  By: Elsworth Soho MD, Leanna Sato   Centricity Description: COUGH Qualifier: Diagnosis of  By: Elsworth Soho MD, Leanna Sato    . Depressive disorder, not elsewhere classified   . Diarrhea 09/27/2008   Qualifier: Diagnosis of  By: Trellis Paganini PA-c, Amy S   . Diverticulosis of colon (without mention of hemorrhage)   . Elevated liver enzymes 10/31/2015  . Esophageal reflux   . ETOH abuse 08/14/2012  . Fatty liver   . Gastritis 04/28/2011  . GERD (gastroesophageal reflux disease)   . Hematochezia 08/14/2012  . Hemoptysis 05/26/2017  . HEMORRHOIDS 09/12/2007   Qualifier: Diagnosis of  By: Nils Pyle CMA ([place]), Mearl Latin    . Hepatic steatosis   . Hiatal hernia   . HIATAL HERNIA 09/12/2007   Qualifier: Diagnosis of  By: Nils Pyle CMA ([place]),  Mearl Latin    . History of recurrent UTIs   . Hyperlipidemia   . Hypertension   . Hypertensive retinopathy of both eyes 11/24/2015  . INFECTIOUS DIARRHEA 09/27/2008   Qualifier: Diagnosis of  By: Laney Potash, Pam    . Iron deficiency   . Iron overload 09/22/2017  . Nausea alone 09/27/2008   Qualifier: Diagnosis of  By: Laney Potash, [place] 09/27/2008   Qualifier: Diagnosis of  By: Shane Crutch, Amy S   . Obesity   . Obesity, Class III, BMI 40-49.9 (morbid obesity) ([place]) 04/27/2011  . Pancreatitis   . Pancreatitis   . Personal history of colonic polyps 10/16/2009   hyperplastic  . Polyarthralgia 12/02/2015   Evaluated by Dr. Charlestine Night (rheumatologist) 12/29/2015:1): 1)possible fibromyalgia (Suggest physical therapy and flexeril), 2) Polyarthralgia/Insomnia (suggest use of robaxin or flexeril), 3) Nicotine Dependence (counseled to quit). 4)Depression (continue therapy with psychiatrist)  . Pre-diabetes   . Pseudocyst of pancreas 08/12/2012  . Pulmonary nodule, right 11/29/2016  . Rectal fissure 05/11/2011  . Rectal fistula 05/11/2011  . RECTAL PAIN 09/12/2007   Qualifier: Diagnosis of  By: Nils Pyle CMA ([place]), Mearl Latin    . Secondary hemochromatosis 09/22/2017  . Seizures ([place])   . SIRS (systemic inflammatory response syndrome) ([place]) 03/11/2013  . Solitary rectal ulcer 04/12/2011  . TOBACCO ABUSE 12/06/2006   Qualifier: History of  By: Elsworth Soho MD, Davonna Belling  V.    . Ulcerative colitis, unspecified   . Ulcerative proctitis, nonspecific ([place]) 03/10/2011  . Varicose veins of both lower extremities 12/02/2015    Past Surgical History:  Procedure Laterality Date  . FINGER AMPUTATION  2010   right index  . HEMORROIDECTOMY    . KNEE ARTHROSCOPY     bilateral, left x2  . TOTAL ABDOMINAL HYSTERECTOMY    . VIDEO BRONCHOSCOPY Bilateral 05/31/2017   Procedure: VIDEO BRONCHOSCOPY WITHOUT FLUORO;  Surgeon: Collene Gobble, MD;  Location: [facility];  Service: Cardiopulmonary;  Laterality:  Bilateral;    Current Medications: No outpatient medications have been marked as taking for the 04/30/19 encounter (Appointment) with Belva Crome, MD.     Allergies:   Patient has no known allergies.   Social History   Socioeconomic History  . Marital status: Single    Spouse name: Not on file  . Number of children: 0  . Years of education: 2  . Highest education level: Not on file  Occupational History  . Occupation: produce Armed forces operational officer: UNEMPLOYED  Tobacco Use  . Smoking status: Current Every Day Smoker    Packs/day: 0.25    Types: Cigars, Cigarettes  . Smokeless tobacco: Never Used  . Tobacco comment: pt states she smokes 3 cigarettes/ day. Trying to quit. Taking Chantix  Substance and Sexual Activity  . Alcohol use: Yes    Alcohol/week: 1.0 standard drinks    Types: 1 Shots of liquor per week    Comment: occ  . Drug use: No  . Sexual activity: Not Currently  Other Topics Concern  . Not on file  Social History Narrative  . Not on file   Social Determinants of Health   Financial Resource Strain:   . Difficulty of Paying Living Expenses: Not on file  Food Insecurity:   . Worried About Charity fundraiser in the Last Year: Not on file  . Ran Out of Food in the Last Year: Not on file  Transportation Needs:   . Lack of Transportation (Medical): Not on file  . Lack of Transportation (Non-Medical): Not on file  Physical Activity:   . Days of Exercise per Week: Not on file  . Minutes of Exercise per Session: Not on file  Stress:   . Feeling of Stress : Not on file  Social Connections:   . Frequency of Communication with Friends and Family: Not on file  . Frequency of Social Gatherings with Friends and Family: Not on file  . Attends Religious Services: Not on file  . Active Member of Clubs or Organizations: Not on file  . Attends Archivist Meetings: Not on file  . Marital Status: Not on file     Family History: The patient's family history  includes Atrial fibrillation in her mother; Colon polyps in her brother; Diabetes in her father; Heart attack in her father; Hyperlipidemia in her father and sister; Hypertension in her father and mother; Irritable bowel syndrome in her mother; Ovarian cancer in her maternal aunt; Thyroid disease in her brother and sister. There is no history of Stomach cancer or Colon cancer.  ROS:   Please see the history of present illness.    *** All other systems reviewed and are negative.  EKGs/Labs/Other Studies Reviewed:    The following studies were reviewed today: ***  EKG:  EKG ***  Recent Labs: 09/05/2018: TSH 1.64 03/27/2019: ALT 44; BUN 9; Creatinine, Ser 0.71; Hemoglobin 11.4; Platelets 163.0;  Potassium 3.7; Sodium 133  Recent Lipid Panel    Component Value Date/Time   CHOL 302 (H) 02/07/2018 1013   TRIG 165.0 (H) 02/07/2018 1013   HDL 107.20 02/07/2018 1013   CHOLHDL 3 02/07/2018 1013   VLDL 33.0 02/07/2018 1013   LDLCALC 162 (H) 02/07/2018 1013   LDLDIRECT 192.5 06/08/2012 1106    Physical Exam:    VS:  There were no vitals taken for this visit.    Wt Readings from Last 3 Encounters:  03/27/19 204 lb 12.8 oz (92.9 kg)  10/05/18 192 lb 2 oz (87.1 kg)  09/29/18 192 lb 0.6 oz (87.1 kg)     GEN: ***. No acute distress HEENT: Normal NECK: No JVD. LYMPHATICS: No lymphadenopathy CARDIAC: *** RRR without murmur, gallop, or edema. VASCULAR: *** Normal Pulses. No bruits. RESPIRATORY:  Clear to auscultation without rales, wheezing or rhonchi  ABDOMEN: Soft, non-tender, non-distended, No pulsatile mass, MUSCULOSKELETAL: No deformity  SKIN: Warm and dry NEUROLOGIC:  Alert and oriented x 3 PSYCHIATRIC:  Normal affect   ASSESSMENT:    1. Palpitations   2. Mixed hyperlipidemia   3. Essential hypertension   4. TOBACCO ABUSE   5. Other cirrhosis of liver ([place])   6. Educated about COVID-19 virus infection    PLAN:    In order of problems listed above:  1. ***   Medication  Adjustments/Labs and Tests Ordered: Current medicines are reviewed at length with the patient today.  Concerns regarding medicines are outlined above.  No orders of the defined types were placed in this encounter.  No orders of the defined types were placed in this encounter.   There are no Patient Instructions on file for this visit.   Signed, Sinclair Grooms, MD  04/29/2019 5:14 PM    [facility]

## 2019-04-30 ENCOUNTER — Ambulatory Visit: Payer: PPO | Admitting: Interventional Cardiology

## 2019-05-02 ENCOUNTER — Other Ambulatory Visit: Payer: Self-pay | Admitting: Nurse Practitioner

## 2019-05-02 DIAGNOSIS — K7469 Other cirrhosis of liver: Secondary | ICD-10-CM

## 2019-05-02 DIAGNOSIS — F319 Bipolar disorder, unspecified: Secondary | ICD-10-CM | POA: Diagnosis not present

## 2019-05-03 ENCOUNTER — Encounter: Payer: Self-pay | Admitting: Family

## 2019-05-11 ENCOUNTER — Other Ambulatory Visit: Payer: Self-pay

## 2019-05-11 ENCOUNTER — Other Ambulatory Visit: Payer: Self-pay | Admitting: Internal Medicine

## 2019-05-11 ENCOUNTER — Other Ambulatory Visit: Payer: PPO

## 2019-05-11 NOTE — Telephone Encounter (Signed)
This has been done. #30 with no refills

## 2019-05-11 NOTE — Telephone Encounter (Signed)
Pt needs rf for promethazine sent to [facility] on [address]. Pt states that she is out of med and would like to expedite it so she has it for the weekend.

## 2019-05-16 ENCOUNTER — Ambulatory Visit (INDEPENDENT_AMBULATORY_CARE_PROVIDER_SITE_OTHER): Payer: PPO | Admitting: Otolaryngology

## 2019-05-17 ENCOUNTER — Telehealth: Payer: Self-pay | Admitting: Family

## 2019-05-17 NOTE — Telephone Encounter (Signed)
Left message for patient and sent her a my-chart message.

## 2019-05-17 NOTE — Telephone Encounter (Signed)
New message:   Pt she was around someone who has tested positive around 5-7 days ago. She would like to know what she should do although she is not having any symptoms.

## 2019-05-23 ENCOUNTER — Ambulatory Visit: Payer: PPO | Attending: Internal Medicine

## 2019-05-23 ENCOUNTER — Other Ambulatory Visit: Payer: PPO

## 2019-05-23 DIAGNOSIS — Z20822 Contact with and (suspected) exposure to covid-19: Secondary | ICD-10-CM | POA: Diagnosis not present

## 2019-05-24 LAB — NOVEL CORONAVIRUS, NAA: SARS-CoV-2, NAA: NOT DETECTED

## 2019-05-28 DIAGNOSIS — M5136 Other intervertebral disc degeneration, lumbar region: Secondary | ICD-10-CM | POA: Diagnosis not present

## 2019-05-28 DIAGNOSIS — M7060 Trochanteric bursitis, unspecified hip: Secondary | ICD-10-CM | POA: Diagnosis not present

## 2019-05-28 DIAGNOSIS — G894 Chronic pain syndrome: Secondary | ICD-10-CM | POA: Diagnosis not present

## 2019-05-28 DIAGNOSIS — M545 Low back pain: Secondary | ICD-10-CM | POA: Diagnosis not present

## 2019-05-29 NOTE — Progress Notes (Deleted)
Cardiology Office Note:    Date:  05/29/2019   ID:  [name] [name], DOB 1960/05/04, MRN [id number]  PCP:  [name] Salvage, FNP  Cardiologist:  Sinclair Grooms, MD   Referring MD: [name] [name],*   No chief complaint on file.   History of Present Illness:    [name] [name] is a 59 y.o. female with a hx of palpitations here for consultation.There is concommitant h/o hypertension, ETOH, COPD, obesity, ulcerative collitis, and hiatal hernia/   ***  Past Medical History:  Diagnosis Date  . ABDOMINAL PAIN -GENERALIZED 09/27/2008   Qualifier: Diagnosis of  By: Trellis Paganini PA-c, Amy S   . Acute epigastric pain 04/27/2011  . Acute pancreatitis 08/12/2012  . Allergic rhinitis 09/05/2017  . Anal fistula   . Anemia   . Anxiety   . Arthritis   . BIPOLAR AFFECTIVE DISORDER 12/06/2006   Qualifier: History of  By: Elsworth Soho MD, Leanna Sato Bipolar disorder ([place])   . Chronic kidney disease   . COPD (chronic obstructive pulmonary disease) ([place]) 11/08/2017  . Cough 12/06/2006   Annotation: chronic since'96, nml spirometry, +ve methacholine challenge, nml  CT sinuses, inconclusive pH probe, esophageal manometry Centricity Description: SYMPTOM, COUGH Qualifier: History of  By: Elsworth Soho MD, Leanna Sato   Centricity Description: COUGH Qualifier: Diagnosis of  By: Elsworth Soho MD, Leanna Sato    . Depressive disorder, not elsewhere classified   . Diarrhea 09/27/2008   Qualifier: Diagnosis of  By: Trellis Paganini PA-c, Amy S   . Diverticulosis of colon (without mention of hemorrhage)   . Elevated liver enzymes 10/31/2015  . Esophageal reflux   . ETOH abuse 08/14/2012  . Fatty liver   . Gastritis 04/28/2011  . GERD (gastroesophageal reflux disease)   . Hematochezia 08/14/2012  . Hemoptysis 05/26/2017  . HEMORRHOIDS 09/12/2007   Qualifier: Diagnosis of  By: Nils Pyle CMA ([place]), Mearl Latin    . Hepatic steatosis   . Hiatal hernia   . HIATAL HERNIA 09/12/2007   Qualifier: Diagnosis of  By: Nils Pyle CMA ([place]), Mearl Latin    .  History of recurrent UTIs   . Hyperlipidemia   . Hypertension   . Hypertensive retinopathy of both eyes 11/24/2015  . INFECTIOUS DIARRHEA 09/27/2008   Qualifier: Diagnosis of  By: Laney Potash, Pam    . Iron deficiency   . Iron overload 09/22/2017  . Nausea alone 09/27/2008   Qualifier: Diagnosis of  By: Laney Potash, [place] 09/27/2008   Qualifier: Diagnosis of  By: Shane Crutch, Amy S   . Obesity   . Obesity, Class III, BMI 40-49.9 (morbid obesity) ([place]) 04/27/2011  . Pancreatitis   . Pancreatitis   . Personal history of colonic polyps 10/16/2009   hyperplastic  . Polyarthralgia 12/02/2015   Evaluated by Dr. Charlestine Night (rheumatologist) 12/29/2015:1): 1)possible fibromyalgia (Suggest physical therapy and flexeril), 2) Polyarthralgia/Insomnia (suggest use of robaxin or flexeril), 3) Nicotine Dependence (counseled to quit). 4)Depression (continue therapy with psychiatrist)  . Pre-diabetes   . Pseudocyst of pancreas 08/12/2012  . Pulmonary nodule, right 11/29/2016  . Rectal fissure 05/11/2011  . Rectal fistula 05/11/2011  . RECTAL PAIN 09/12/2007   Qualifier: Diagnosis of  By: Nils Pyle CMA ([place]), Mearl Latin    . Secondary hemochromatosis 09/22/2017  . Seizures ([place])   . SIRS (systemic inflammatory response syndrome) ([place]) 03/11/2013  . Solitary rectal ulcer 04/12/2011  . TOBACCO ABUSE 12/06/2006   Qualifier: History of  By: Elsworth Soho MD, Davonna Belling  V.    . Ulcerative colitis, unspecified   . Ulcerative proctitis, nonspecific ([place]) 03/10/2011  . Varicose veins of both lower extremities 12/02/2015    Past Surgical History:  Procedure Laterality Date  . FINGER AMPUTATION  2010   right index  . HEMORROIDECTOMY    . KNEE ARTHROSCOPY     bilateral, left x2  . TOTAL ABDOMINAL HYSTERECTOMY    . VIDEO BRONCHOSCOPY Bilateral 05/31/2017   Procedure: VIDEO BRONCHOSCOPY WITHOUT FLUORO;  Surgeon: Collene Gobble, MD;  Location: [facility];  Service: Cardiopulmonary;  Laterality: Bilateral;     Current Medications: No outpatient medications have been marked as taking for the 05/30/19 encounter (Appointment) with Belva Crome, MD.     Allergies:   Patient has no known allergies.   Social History   Socioeconomic History  . Marital status: Single    Spouse name: Not on file  . Number of children: 0  . Years of education: 2  . Highest education level: Not on file  Occupational History  . Occupation: produce Armed forces operational officer: UNEMPLOYED  Tobacco Use  . Smoking status: Current Every Day Smoker    Packs/day: 0.25    Types: Cigars, Cigarettes  . Smokeless tobacco: Never Used  . Tobacco comment: pt states she smokes 3 cigarettes/ day. Trying to quit. Taking Chantix  Substance and Sexual Activity  . Alcohol use: Yes    Alcohol/week: 1.0 standard drinks    Types: 1 Shots of liquor per week    Comment: occ  . Drug use: No  . Sexual activity: Not Currently  Other Topics Concern  . Not on file  Social History Narrative  . Not on file   Social Determinants of Health   Financial Resource Strain:   . Difficulty of Paying Living Expenses:   Food Insecurity:   . Worried About Charity fundraiser in the Last Year:   . Arboriculturist in the Last Year:   Transportation Needs:   . Film/video editor (Medical):   Marland Kitchen Lack of Transportation (Non-Medical):   Physical Activity:   . Days of Exercise per Week:   . Minutes of Exercise per Session:   Stress:   . Feeling of Stress :   Social Connections:   . Frequency of Communication with Friends and Family:   . Frequency of Social Gatherings with Friends and Family:   . Attends Religious Services:   . Active Member of Clubs or Organizations:   . Attends Archivist Meetings:   Marland Kitchen Marital Status:      Family History: The patient's family history includes Atrial fibrillation in her mother; Colon polyps in her brother; Diabetes in her father; Heart attack in her father; Hyperlipidemia in her father and sister;  Hypertension in her father and mother; Irritable bowel syndrome in her mother; Ovarian cancer in her maternal aunt; Thyroid disease in her brother and sister. There is no history of Stomach cancer or Colon cancer.  ROS:   Please see the history of present illness.    *** All other systems reviewed and are negative.  EKGs/Labs/Other Studies Reviewed:    The following studies were reviewed today: ***  EKG:  EKG ***  Recent Labs: 09/05/2018: TSH 1.64 03/27/2019: ALT 44; BUN 9; Creatinine, Ser 0.71; Hemoglobin 11.4; Platelets 163.0; Potassium 3.7; Sodium 133  Recent Lipid Panel    Component Value Date/Time   CHOL 302 (H) 02/07/2018 1013   TRIG 165.0 (H) 02/07/2018 1013  HDL 107.20 02/07/2018 1013   CHOLHDL 3 02/07/2018 1013   VLDL 33.0 02/07/2018 1013   LDLCALC 162 (H) 02/07/2018 1013   LDLDIRECT 192.5 06/08/2012 1106    Physical Exam:    VS:  There were no vitals taken for this visit.    Wt Readings from Last 3 Encounters:  03/27/19 204 lb 12.8 oz (92.9 kg)  10/05/18 192 lb 2 oz (87.1 kg)  09/29/18 192 lb 0.6 oz (87.1 kg)     GEN: ***. No acute distress HEENT: Normal NECK: No JVD. LYMPHATICS: No lymphadenopathy CARDIAC: *** RRR without murmur, gallop, or edema. VASCULAR: *** Normal Pulses. No bruits. RESPIRATORY:  Clear to auscultation without rales, wheezing or rhonchi  ABDOMEN: Soft, non-tender, non-distended, No pulsatile mass, MUSCULOSKELETAL: No deformity  SKIN: Warm and dry NEUROLOGIC:  Alert and oriented x 3 PSYCHIATRIC:  Normal affect   ASSESSMENT:    1. Palpitations   2. Bipolar disorder, current episode mixed, mild ([place])   3. TOBACCO ABUSE   4. Mixed hyperlipidemia   5. Essential hypertension   6. Educated about COVID-19 virus infection    PLAN:    In order of problems listed above:  1. ***   Medication Adjustments/Labs and Tests Ordered: Current medicines are reviewed at length with the patient today.  Concerns regarding medicines are outlined  above.  No orders of the defined types were placed in this encounter.  No orders of the defined types were placed in this encounter.   There are no Patient Instructions on file for this visit.   Signed, Sinclair Grooms, MD  05/29/2019 1:41 PM    [place]

## 2019-05-30 ENCOUNTER — Encounter: Payer: Self-pay | Admitting: Family

## 2019-05-30 ENCOUNTER — Ambulatory Visit: Payer: PPO | Admitting: Interventional Cardiology

## 2019-05-30 ENCOUNTER — Telehealth: Payer: Self-pay | Admitting: Interventional Cardiology

## 2019-05-30 NOTE — Telephone Encounter (Signed)
Per Dr. Tamala Julian do not schedule patient on his schedule due to numerous cancellations. Thank you.

## 2019-05-31 ENCOUNTER — Other Ambulatory Visit: Payer: Self-pay | Admitting: Family

## 2019-05-31 DIAGNOSIS — E782 Mixed hyperlipidemia: Secondary | ICD-10-CM

## 2019-06-03 ENCOUNTER — Other Ambulatory Visit: Payer: Self-pay | Admitting: Internal Medicine

## 2019-06-04 ENCOUNTER — Encounter: Payer: Self-pay | Admitting: Family

## 2019-06-05 ENCOUNTER — Encounter: Payer: Self-pay | Admitting: Family

## 2019-06-06 ENCOUNTER — Other Ambulatory Visit: Payer: PPO

## 2019-06-12 DIAGNOSIS — M25562 Pain in left knee: Secondary | ICD-10-CM | POA: Insufficient documentation

## 2019-06-13 ENCOUNTER — Other Ambulatory Visit: Payer: PPO

## 2019-06-14 DIAGNOSIS — M25562 Pain in left knee: Secondary | ICD-10-CM | POA: Diagnosis not present

## 2019-06-14 DIAGNOSIS — M1712 Unilateral primary osteoarthritis, left knee: Secondary | ICD-10-CM | POA: Diagnosis not present

## 2019-06-15 ENCOUNTER — Telehealth: Payer: Self-pay | Admitting: Family

## 2019-06-15 NOTE — Telephone Encounter (Signed)
° ° °  Patient calling to report headache, BP 144/84. Patient wants to know if she is safe to take Aspirin 81mg  for headache.  Please advise

## 2019-06-18 NOTE — Telephone Encounter (Signed)
I spoke to patient today and she was feeling fine. She feels her blood pressure was elevated due to getting overheated while doing some yard work on Friday. She will start checking her blood pressure regularly and keep log for the next 1-2 weeks; she will follow-up if consistently above 140/90; Also encouraged patient to get the [place] or [employer] Covid vaccine due to her anxiety regarding Wynetta Emery and [employer] vaccine.

## 2019-06-20 ENCOUNTER — Ambulatory Visit: Payer: PPO | Admitting: Emergency Medicine

## 2019-06-26 ENCOUNTER — Other Ambulatory Visit: Payer: PPO

## 2019-06-26 DIAGNOSIS — F419 Anxiety disorder, unspecified: Secondary | ICD-10-CM | POA: Diagnosis not present

## 2019-06-26 DIAGNOSIS — M545 Low back pain: Secondary | ICD-10-CM | POA: Diagnosis not present

## 2019-06-26 DIAGNOSIS — M179 Osteoarthritis of knee, unspecified: Secondary | ICD-10-CM | POA: Diagnosis not present

## 2019-06-26 DIAGNOSIS — M48062 Spinal stenosis, lumbar region with neurogenic claudication: Secondary | ICD-10-CM | POA: Diagnosis not present

## 2019-06-26 DIAGNOSIS — M25519 Pain in unspecified shoulder: Secondary | ICD-10-CM | POA: Diagnosis not present

## 2019-06-26 DIAGNOSIS — M79609 Pain in unspecified limb: Secondary | ICD-10-CM | POA: Diagnosis not present

## 2019-06-26 DIAGNOSIS — M5136 Other intervertebral disc degeneration, lumbar region: Secondary | ICD-10-CM | POA: Diagnosis not present

## 2019-06-26 DIAGNOSIS — M47897 Other spondylosis, lumbosacral region: Secondary | ICD-10-CM | POA: Diagnosis not present

## 2019-06-26 DIAGNOSIS — G8929 Other chronic pain: Secondary | ICD-10-CM | POA: Diagnosis not present

## 2019-06-26 DIAGNOSIS — M25559 Pain in unspecified hip: Secondary | ICD-10-CM | POA: Diagnosis not present

## 2019-06-26 DIAGNOSIS — M792 Neuralgia and neuritis, unspecified: Secondary | ICD-10-CM | POA: Diagnosis not present

## 2019-06-26 DIAGNOSIS — G894 Chronic pain syndrome: Secondary | ICD-10-CM | POA: Diagnosis not present

## 2019-06-29 ENCOUNTER — Telehealth: Payer: Self-pay | Admitting: Emergency Medicine

## 2019-06-29 NOTE — Telephone Encounter (Signed)
Called and spoke with patient. "feels like there is something in throat that she can't get out." offered her appointment for today and she said that she got her 2nd covid shot today so it wasn't a good day. She has been scheduled with Aaron Edelman for 5/10 at 9:30am. Patient encouraged to call back or go to emergency room if it got worse.

## 2019-07-01 NOTE — Progress Notes (Deleted)
$'@Patient'K$  ID: [name] [name], female    DOB: [birth year], 59 y.o.   MRN: [id number]  No chief complaint on file.   Referring provider: [name],*  HPI:   PMH:  Smoker/ Smoking History:  Maintenance:   Pt of:   07/01/2019  - Visit   59 year old female last seen in our office in 2019 followed by Dr. [name].  At that office visit in 2019 patient was assessed for her right pulmonary nodule with plans to repeat a CT chest in December/2019, she was encouraged to stop smoking, her cough was better controlled due to an increase of her narcotics from her pain management, she was encouraged to remain on her rhinitis and GERD regimens, she was stable on albuterol for management of her COPD and it was emphasized that she needs to stop smoking. 03/08/2018-CT chest without contrast-dominant right upper lobe pulmonary nodules unchanged from 10/2016, additional follow-up could be obtained in 1 year or as clinically indicated, hepatic stenosis, arthrosclerosis, enlarged pulmonary arteries indicative of pulmonary artery, indicative of pulmonary arterial hypertension  04/04/2019-CT chest without contrast-no acute findings for explanation of patient's symptoms, stable partially calcified right apical nodule likely postinflammatory, additional tiny pulmonary nodules are also stable, hepatic stenosis, aortic arthrosclerosis  11/10/2016-CT chest super D with contrast-2.1 cm lobular nodule identified in peripheral right upper lobe, overlying pleural tethering is concerning for neoplasm, innumerable tiny bilateral pulmonary nodules measuring in the 2 to 4 mm size range, diffuse calcification of the right adrenal gland potentially from prior hemorrhage  11/18/2016-PET scan-2.1 x 1.4 cm nodule in the right upper lobe demonstrates low-level FDG uptake with a maximum SUV of 2.5  Questionaires / Pulmonary Flowsheets:   ACT:  No flowsheet data found.  MMRC: No flowsheet data found.  Epworth:  No flowsheet  data found.  Tests:   FENO:  No results found for: NITRICOXIDE  PFT: PFT Results Latest Ref Rng & Units 11/17/2016  FVC-Pre L 3.32  FVC-Predicted Pre % 93  FVC-Post L 3.45  FVC-Predicted Post % 97  Pre FEV1/FVC % % 83  Post FEV1/FCV % % 85  FEV1-Pre L 2.77  FEV1-Predicted Pre % 100  FEV1-Post L 2.92  DLCO UNC% % 84  DLCO COR %Predicted % 85  TLC L 5.34  TLC % Predicted % 102  RV % Predicted % 97    WALK:  No flowsheet data found.  Imaging: No results found.  Lab Results:  CBC    Component Value Date/Time   WBC 4.5 03/27/2019 1042   RBC 3.26 (L) 03/27/2019 1042   HGB 11.4 (L) 03/27/2019 1042   HCT 34.7 (L) 03/27/2019 1042   PLT 163.0 03/27/2019 1042   MCV 106.4 (H) 03/27/2019 1042   MCH 35.5 (H) 12/12/2017 0953   MCHC 32.8 03/27/2019 1042   RDW 14.2 03/27/2019 1042   LYMPHSABS 2.2 03/27/2019 1042   MONOABS 0.5 03/27/2019 1042   EOSABS 0.1 03/27/2019 1042   BASOSABS 0.1 03/27/2019 1042    BMET    Component Value Date/Time   NA 133 (L) 03/27/2019 1042   K 3.7 03/27/2019 1042   CL 94 (L) 03/27/2019 1042   CO2 27 03/27/2019 1042   GLUCOSE 124 (H) 03/27/2019 1042   BUN 9 03/27/2019 1042   CREATININE 0.71 03/27/2019 1042   CALCIUM 9.3 03/27/2019 1042   GFRNONAA >90 03/15/2013 0502   GFRAA >90 03/15/2013 0502    BNP No results found for: BNP  ProBNP No results found for: PROBNP  Specialty Problems      Pulmonary Problems   Cough    Annotation: chronic since'96, nml spirometry, +ve methacholine challenge, nml  CT sinuses, inconclusive pH probe, esophageal manometry Centricity Description: SYMPTOM, COUGH Qualifier: History of  By: Elsworth Soho MD, Leanna Sato   Centricity Description: COUGH Qualifier: Diagnosis of  By: Elsworth Soho MD, Leanna Sato        HIATAL HERNIA    Qualifier: Diagnosis of  By: Nils Pyle CMA Deborra Medina), Mearl Latin        Pulmonary nodule, right   Hemoptysis   Allergic rhinitis   COPD (chronic obstructive pulmonary disease) ([place])      No  Known Allergies  Immunization History  Administered Date(s) Administered  . Hepatitis A, Adult 10/02/2018  . Influenza, Quadrivalent, Recombinant, Inj, Pf 11/15/2018  . Pneumococcal Polysaccharide-23 10/02/2018  . Zoster Recombinat (Shingrix) 10/02/2018, 12/05/2018    Past Medical History:  Diagnosis Date  . ABDOMINAL PAIN -GENERALIZED 09/27/2008   Qualifier: Diagnosis of  By: Trellis Paganini PA-c, Amy S   . Acute epigastric pain 04/27/2011  . Acute pancreatitis 08/12/2012  . Allergic rhinitis 09/05/2017  . Anal fistula   . Anemia   . Anxiety   . Arthritis   . BIPOLAR AFFECTIVE DISORDER 12/06/2006   Qualifier: History of  By: Elsworth Soho MD, Leanna Sato Bipolar disorder ([place])   . Chronic kidney disease   . COPD (chronic obstructive pulmonary disease) ([place]) 11/08/2017  . Cough 12/06/2006   Annotation: chronic since'96, nml spirometry, +ve methacholine challenge, nml  CT sinuses, inconclusive pH probe, esophageal manometry Centricity Description: SYMPTOM, COUGH Qualifier: History of  By: Elsworth Soho MD, Leanna Sato   Centricity Description: COUGH Qualifier: Diagnosis of  By: Elsworth Soho MD, Leanna Sato    . Depressive disorder, not elsewhere classified   . Diarrhea 09/27/2008   Qualifier: Diagnosis of  By: Trellis Paganini PA-c, Amy S   . Diverticulosis of colon (without mention of hemorrhage)   . Elevated liver enzymes 10/31/2015  . Esophageal reflux   . ETOH abuse 08/14/2012  . Fatty liver   . Gastritis 04/28/2011  . GERD (gastroesophageal reflux disease)   . Hematochezia 08/14/2012  . Hemoptysis 05/26/2017  . HEMORRHOIDS 09/12/2007   Qualifier: Diagnosis of  By: Nils Pyle CMA ([place]), Mearl Latin    . Hepatic steatosis   . Hiatal hernia   . HIATAL HERNIA 09/12/2007   Qualifier: Diagnosis of  By: Nils Pyle CMA ([place]), Mearl Latin    . History of recurrent UTIs   . Hyperlipidemia   . Hypertension   . Hypertensive retinopathy of both eyes 11/24/2015  . INFECTIOUS DIARRHEA 09/27/2008   Qualifier: Diagnosis of  By: Laney Potash, Pam    .  Iron deficiency   . Iron overload 09/22/2017  . Nausea alone 09/27/2008   Qualifier: Diagnosis of  By: Laney Potash, [place] 09/27/2008   Qualifier: Diagnosis of  By: Shane Crutch, Amy S   . Obesity   . Obesity, Class III, BMI 40-49.9 (morbid obesity) ([place]) 04/27/2011  . Pancreatitis   . Pancreatitis   . Personal history of colonic polyps 10/16/2009   hyperplastic  . Polyarthralgia 12/02/2015   Evaluated by Dr. Charlestine Night (rheumatologist) 12/29/2015:1): 1)possible fibromyalgia (Suggest physical therapy and flexeril), 2) Polyarthralgia/Insomnia (suggest use of robaxin or flexeril), 3) Nicotine Dependence (counseled to quit). 4)Depression (continue therapy with psychiatrist)  . Pre-diabetes   . Pseudocyst of pancreas 08/12/2012  . Pulmonary nodule, right 11/29/2016  . Rectal fissure 05/11/2011  . Rectal  fistula 05/11/2011  . RECTAL PAIN 09/12/2007   Qualifier: Diagnosis of  By: Nils Pyle CMA ([place]), Mearl Latin    . Secondary hemochromatosis 09/22/2017  . Seizures ([place])   . SIRS (systemic inflammatory response syndrome) ([place]) 03/11/2013  . Solitary rectal ulcer 04/12/2011  . TOBACCO ABUSE 12/06/2006   Qualifier: History of  By: Elsworth Soho MD, Leanna Sato.    . Ulcerative colitis, unspecified   . Ulcerative proctitis, nonspecific ([place]) 03/10/2011  . Varicose veins of both lower extremities 12/02/2015    Tobacco History: Social History   Tobacco Use  Smoking Status Current Every Day Smoker  . Packs/day: 0.25  . Types: Cigars, Cigarettes  Smokeless Tobacco Never Used  Tobacco Comment   pt states she smokes 3 cigarettes/ day. Trying to quit. Taking Chantix   Ready to quit: Not Answered Counseling given: Not Answered Comment: pt states she smokes 3 cigarettes/ day. Trying to quit. Taking Chantix   Continue to not smoke  Outpatient Encounter Medications as of 07/02/2019  Medication Sig  . ARIPiprazole (ABILIFY) 30 MG tablet   . atorvastatin (LIPITOR) 40 MG tablet TAKE ONE TABLET BY MOUTH  DAILY AT 6PM AS DIRECTED  . benzonatate (TESSALON) 200 MG capsule TAKE ONE CAPSULE BY MOUTH EVERY 6 HOURS AS NEEDED FOR COUGH  . blood glucose meter kit and supplies KIT Dispense based on patient and insurance preference. Use up to four times daily as directed. (FOR ICD-9 250.00, 250.01).  . DEXILANT 60 MG capsule TAKE ONE CAPSULE BY MOUTH DAILY  . diclofenac sodium (VOLTAREN) 1 % GEL Apply 2 g topically 4 (four) times daily.   Marland Kitchen diltiazem 2 % GEL Apply 1 application topically 5 (five) times daily.  Marland Kitchen FLUoxetine (PROZAC) 20 MG capsule Take 20 mg by mouth 2 (two) times daily.   . fluticasone (FLONASE) 50 MCG/ACT nasal spray SPRAY TWO SPRAYS IN EACH NOSTRIL ONCE DAILY  . glucose blood (ONE TOUCH ULTRA TEST) test strip   . HYDROcodone-acetaminophen (NORCO/VICODIN) 5-325 MG tablet Take 1 tablet by mouth 4 (four) times daily as needed for moderate pain.   Marland Kitchen lamoTRIgine (LAMICTAL) 100 MG tablet Take 100 mg by mouth 2 (two) times daily.   . Lancets (ONETOUCH DELICA PLUS [id number]) MISC USE TO TEST BLOOD SUGAR UP TO FOUR TIMES A DAY AS DIRECTED  . methocarbamol (ROBAXIN) 500 MG tablet Take 500 mg by mouth 2 (two) times daily as needed for muscle spasms.   . Multiple Vitamin (MULTIVITAMIN WITH MINERALS) TABS tablet Take 1 tablet by mouth daily.  Marland Kitchen NARCAN 4 MG/0.1ML LIQD nasal spray kit Place 0.4 mg into the nose once.   . predniSONE (DELTASONE) 20 MG tablet Take 1 tablet (20 mg total) by mouth daily with breakfast.  . promethazine (PHENERGAN) 25 MG suppository INSERT ONE SUPPOSITORY RECTALLY EVERY 6 HOURS AS NEEDED FOR NAUSEA AND VOMITING  . Tetrahydrozoline HCl (VISINE OP) Place 1-2 drops into both eyes daily.  . traMADol (ULTRAM) 50 MG tablet    No facility-administered encounter medications on file as of 07/02/2019.     Review of Systems  Review of Systems   Physical Exam  There were no vitals taken for this visit.  Wt Readings from Last 5 Encounters:  03/27/19 204 lb 12.8 oz (92.9 kg)    10/05/18 192 lb 2 oz (87.1 kg)  09/29/18 192 lb 0.6 oz (87.1 kg)  09/05/18 190 lb 3.2 oz (86.3 kg)  04/05/18 196 lb 1.9 oz (89 kg)    BMI Readings from Last 5 Encounters:  03/27/19 35.71 kg/m  10/05/18 33.50 kg/m  09/29/18 31.96 kg/m  09/05/18 31.65 kg/m  04/05/18 32.64 kg/m     Physical Exam    Assessment & Plan:   No problem-specific Assessment & Plan notes found for this encounter.    No follow-ups on file.   Lauraine Rinne, NP 07/01/2019   This appointment required *** minutes of patient care (this includes precharting, chart review, review of results, face-to-face care, etc.).

## 2019-07-02 ENCOUNTER — Ambulatory Visit: Payer: PPO | Admitting: Pulmonary Disease

## 2019-07-02 ENCOUNTER — Other Ambulatory Visit: Payer: PPO

## 2019-07-04 ENCOUNTER — Ambulatory Visit: Payer: PPO | Admitting: Primary Care

## 2019-07-05 ENCOUNTER — Encounter: Payer: Self-pay | Admitting: Primary Care

## 2019-07-05 ENCOUNTER — Ambulatory Visit (INDEPENDENT_AMBULATORY_CARE_PROVIDER_SITE_OTHER): Payer: PPO | Admitting: Primary Care

## 2019-07-05 ENCOUNTER — Other Ambulatory Visit: Payer: Self-pay

## 2019-07-05 VITALS — BP 126/82 | HR 88 | Temp 98.2°F | Ht 65.0 in | Wt 202.0 lb

## 2019-07-05 DIAGNOSIS — F17200 Nicotine dependence, unspecified, uncomplicated: Secondary | ICD-10-CM

## 2019-07-05 DIAGNOSIS — R911 Solitary pulmonary nodule: Secondary | ICD-10-CM

## 2019-07-05 DIAGNOSIS — J449 Chronic obstructive pulmonary disease, unspecified: Secondary | ICD-10-CM | POA: Diagnosis not present

## 2019-07-05 DIAGNOSIS — K219 Gastro-esophageal reflux disease without esophagitis: Secondary | ICD-10-CM | POA: Diagnosis not present

## 2019-07-05 DIAGNOSIS — R05 Cough: Secondary | ICD-10-CM

## 2019-07-05 DIAGNOSIS — R059 Cough, unspecified: Secondary | ICD-10-CM

## 2019-07-05 MED ORDER — FLUTTER DEVI: 3.0000 | Freq: Four times a day (QID) | 0 refills | Status: AC | PRN

## 2019-07-05 MED ORDER — MONTELUKAST SODIUM 10 MG PO TABS: 10.0000 mg | ORAL_TABLET | Freq: Every day | ORAL | 3 refills | Status: DC

## 2019-07-05 NOTE — Patient Instructions (Addendum)
Recommendations: Take over the counter mucinex twice a day for 10 days with FULL glass of water Continue Flonase nasal spray per ENT  Continue Dexilant per GI   Rx: Singulair 10mg  at bedtime   Orders: Use Flutter valve for 20 min 3-4 times a day followed by coughing exercises   Refer: Lung cancer screening clinic (we will help you call insurance and ask if covered d/t pack year history requirement change )  Follow-up: If symptoms do not improve in 7-10 days consider abx and or CXR

## 2019-07-05 NOTE — Progress Notes (Signed)
$'@Patient'v$  ID: [name] [name], female    DOB: 1960/12/18, 59 y.o.   MRN: [id number]  Chief Complaint  Patient presents with  . Follow-up    pt states a persitant cough.pt states feeling of something caught in throat. mucus is clear    Referring provider: [name],*  HPI: 59 year old female. Current light smoker (22 pack year hx). Started smoking age 65 averaged 1/2 pack a day for 44 years. PMH significant for LPR, COPD, pulmonary nodule. Patient of Dr. [name], last seen on 11/08/17.   07/05/2019 Patient presents today for follow-up. Reports cough with clear mucus for the last 2-3 weeks. States that she feels that something went down the wrong way. She does not have trouble breathing. She will get winded from coughing or with strenously activity. She has not needed to use her albuterol rescue inhaler.  Reports that she has a sensitive gag reflux. Uses Flonase at bedtime and takes dexilant for GERD. She follows with otolaryngology. She is also on Norco for chronic back/knee pain which does help her cough. This is prescribed by pain management for this.   Imaging: 04/04/19 CT chest wo contrast: No acute findings or explanation for the patient's symptoms. Stable partially calcified right apical nodule, likely postinflammatory. Additional tiny pulmonary nodules bilaterally are also stable.  No Known Allergies  Immunization History  Administered Date(s) Administered  . Hepatitis A, Adult 10/02/2018  . Influenza, Quadrivalent, Recombinant, Inj, Pf 11/15/2018  . PFIZER SARS-COV-2 Vaccination 06/29/2019  . Pneumococcal Polysaccharide-23 10/02/2018  . Zoster Recombinat (Shingrix) 10/02/2018, 12/05/2018    Past Medical History:  Diagnosis Date  . ABDOMINAL PAIN -GENERALIZED 09/27/2008   Qualifier: Diagnosis of  By: Trellis Paganini PA-c, Amy S   . Acute epigastric pain 04/27/2011  . Acute pancreatitis 08/12/2012  . Allergic rhinitis 09/05/2017  . Anal fistula   . Anemia   . Anxiety   .  Arthritis   . BIPOLAR AFFECTIVE DISORDER 12/06/2006   Qualifier: History of  By: Elsworth Soho MD, Leanna Sato Bipolar disorder ([place])   . Chronic kidney disease   . COPD (chronic obstructive pulmonary disease) ([place]) 11/08/2017  . Cough 12/06/2006   Annotation: chronic since'96, nml spirometry, +ve methacholine challenge, nml  CT sinuses, inconclusive pH probe, esophageal manometry Centricity Description: SYMPTOM, COUGH Qualifier: History of  By: Elsworth Soho MD, Leanna Sato   Centricity Description: COUGH Qualifier: Diagnosis of  By: Elsworth Soho MD, Leanna Sato    . Depressive disorder, not elsewhere classified   . Diarrhea 09/27/2008   Qualifier: Diagnosis of  By: Trellis Paganini PA-c, Amy S   . Diverticulosis of colon (without mention of hemorrhage)   . Elevated liver enzymes 10/31/2015  . Esophageal reflux   . ETOH abuse 08/14/2012  . Fatty liver   . Gastritis 04/28/2011  . GERD (gastroesophageal reflux disease)   . Hematochezia 08/14/2012  . Hemoptysis 05/26/2017  . HEMORRHOIDS 09/12/2007   Qualifier: Diagnosis of  By: Nils Pyle CMA ([place]), Mearl Latin    . Hepatic steatosis   . Hiatal hernia   . HIATAL HERNIA 09/12/2007   Qualifier: Diagnosis of  By: Nils Pyle CMA ([place]), Mearl Latin    . History of recurrent UTIs   . Hyperlipidemia   . Hypertension   . Hypertensive retinopathy of both eyes 11/24/2015  . INFECTIOUS DIARRHEA 09/27/2008   Qualifier: Diagnosis of  By: Laney Potash, Pam    . Iron deficiency   . Iron overload 09/22/2017  . Nausea alone 09/27/2008   Qualifier: Diagnosis  of  By: Laney Potash, Danley Danker AND VOMITING 09/27/2008   Qualifier: Diagnosis of  By: Shane Crutch, Amy S   . Obesity   . Obesity, Class III, BMI 40-49.9 (morbid obesity) ([place]) 04/27/2011  . Pancreatitis   . Pancreatitis   . Personal history of colonic polyps 10/16/2009   hyperplastic  . Polyarthralgia 12/02/2015   Evaluated by Dr. Charlestine Night (rheumatologist) 12/29/2015:1): 1)possible fibromyalgia (Suggest physical therapy and flexeril), 2)  Polyarthralgia/Insomnia (suggest use of robaxin or flexeril), 3) Nicotine Dependence (counseled to quit). 4)Depression (continue therapy with psychiatrist)  . Pre-diabetes   . Pseudocyst of pancreas 08/12/2012  . Pulmonary nodule, right 11/29/2016  . Rectal fissure 05/11/2011  . Rectal fistula 05/11/2011  . RECTAL PAIN 09/12/2007   Qualifier: Diagnosis of  By: Nils Pyle CMA ([place]), Mearl Latin    . Secondary hemochromatosis 09/22/2017  . Seizures ([place])   . SIRS (systemic inflammatory response syndrome) ([place]) 03/11/2013  . Solitary rectal ulcer 04/12/2011  . TOBACCO ABUSE 12/06/2006   Qualifier: History of  By: Elsworth Soho MD, Leanna Sato.    . Ulcerative colitis, unspecified   . Ulcerative proctitis, nonspecific ([place]) 03/10/2011  . Varicose veins of both lower extremities 12/02/2015    Tobacco History: Social History   Tobacco Use  Smoking Status Current Every Day Smoker  . Packs/day: 0.25  . Types: Cigars, Cigarettes  Smokeless Tobacco Never Used  Tobacco Comment   pt states she smokes 3 cigarettes/ day. Trying to quit. Taking Chantix   Ready to quit: Not Answered Counseling given: Not Answered Comment: pt states she smokes 3 cigarettes/ day. Trying to quit. Taking Chantix   Outpatient Medications Prior to Visit  Medication Sig Dispense Refill  . ARIPiprazole (ABILIFY) 30 MG tablet     . atorvastatin (LIPITOR) 40 MG tablet TAKE ONE TABLET BY MOUTH DAILY AT 6PM AS DIRECTED 90 tablet 0  . benzonatate (TESSALON) 200 MG capsule TAKE ONE CAPSULE BY MOUTH EVERY 6 HOURS AS NEEDED FOR COUGH 20 capsule 0  . DEXILANT 60 MG capsule TAKE ONE CAPSULE BY MOUTH DAILY 30 capsule 5  . diclofenac sodium (VOLTAREN) 1 % GEL Apply 2 g topically 4 (four) times daily.     Marland Kitchen FLUoxetine (PROZAC) 20 MG capsule Take 20 mg by mouth 2 (two) times daily.     . fluticasone (FLONASE) 50 MCG/ACT nasal spray SPRAY TWO SPRAYS IN EACH NOSTRIL ONCE DAILY 48 g 2  . HYDROcodone-acetaminophen (NORCO/VICODIN) 5-325 MG tablet Take 1 tablet  by mouth 4 (four) times daily as needed for moderate pain.     Marland Kitchen lamoTRIgine (LAMICTAL) 100 MG tablet Take 100 mg by mouth 2 (two) times daily.     . methocarbamol (ROBAXIN) 500 MG tablet Take 500 mg by mouth 2 (two) times daily as needed for muscle spasms.     . Multiple Vitamin (MULTIVITAMIN WITH MINERALS) TABS tablet Take 1 tablet by mouth daily. 30 tablet 0  . NARCAN 4 MG/0.1ML LIQD nasal spray kit Place 0.4 mg into the nose once.     . predniSONE (DELTASONE) 20 MG tablet Take 1 tablet (20 mg total) by mouth daily with breakfast. 5 tablet 0  . promethazine (PHENERGAN) 25 MG suppository INSERT ONE SUPPOSITORY RECTALLY EVERY 6 HOURS AS NEEDED FOR NAUSEA AND VOMITING 30 suppository 1  . traMADol (ULTRAM) 50 MG tablet     . Tetrahydrozoline HCl (VISINE OP) Place 1-2 drops into both eyes daily.    . blood glucose meter kit and supplies KIT  Dispense based on patient and insurance preference. Use up to four times daily as directed. (FOR ICD-9 250.00, 250.01). 1 each 0  . diltiazem 2 % GEL Apply 1 application topically 5 (five) times daily. 30 g 3  . glucose blood (ONE TOUCH ULTRA TEST) test strip     . Lancets (ONETOUCH DELICA PLUS [id number]) MISC USE TO TEST BLOOD SUGAR UP TO FOUR TIMES A DAY AS DIRECTED 100 each 0   No facility-administered medications prior to visit.   Review of Systems  Review of Systems  Respiratory: Positive for cough. Negative for chest tightness, shortness of breath and wheezing.   Cardiovascular: Negative.    Physical Exam  BP 126/82 (BP Location: Right Arm, Cuff Size: Normal)   Pulse 88   Temp 98.2 F (36.8 C) (Temporal)   Ht '5\' 5"'$  (1.651 m)   Wt 202 lb (91.6 kg)   SpO2 97%   BMI 33.61 kg/m  Physical Exam Constitutional:      Appearance: Normal appearance.  HENT:     Head: Normocephalic and atraumatic.     Mouth/Throat:     Mouth: Mucous membranes are moist.     Pharynx: Oropharynx is clear.     Comments: Cobblestone appearance posterior pharynx   Cardiovascular:     Rate and Rhythm: Normal rate and regular rhythm.  Pulmonary:     Effort: Pulmonary effort is normal.     Breath sounds: Normal breath sounds.  Musculoskeletal:     Cervical back: Normal range of motion.  Skin:    General: Skin is warm and dry.  Neurological:     Mental Status: She is alert.  Psychiatric:        Mood and Affect: Mood normal.        Behavior: Behavior normal.        Thought Content: Thought content normal.        Judgment: Judgment normal.      Lab Results:  CBC    Component Value Date/Time   WBC 4.5 03/27/2019 1042   RBC 3.26 (L) 03/27/2019 1042   HGB 11.4 (L) 03/27/2019 1042   HCT 34.7 (L) 03/27/2019 1042   PLT 163.0 03/27/2019 1042   MCV 106.4 (H) 03/27/2019 1042   MCH 35.5 (H) 12/12/2017 0953   MCHC 32.8 03/27/2019 1042   RDW 14.2 03/27/2019 1042   LYMPHSABS 2.2 03/27/2019 1042   MONOABS 0.5 03/27/2019 1042   EOSABS 0.1 03/27/2019 1042   BASOSABS 0.1 03/27/2019 1042    BMET    Component Value Date/Time   NA 133 (L) 03/27/2019 1042   K 3.7 03/27/2019 1042   CL 94 (L) 03/27/2019 1042   CO2 27 03/27/2019 1042   GLUCOSE 124 (H) 03/27/2019 1042   BUN 9 03/27/2019 1042   CREATININE 0.71 03/27/2019 1042   CALCIUM 9.3 03/27/2019 1042   GFRNONAA >90 03/15/2013 0502   GFRAA >90 03/15/2013 0502    BNP No results found for: BNP  ProBNP No results found for: PROBNP  Imaging: No results found.   Assessment & Plan:   Cough - Current light smoker. No signs of bacterial infections. Cough more consistent with UACS vs LPR - Recommend over the counter mucinex twice a day for 10 days with FULL glass of water - Continue Flonase nasal spray per ENT  - Continue Dexilant per GI  - Rx Singulair '10mg'$  at bedtime  - Orders Use Flutter valve for 20 min 3-4 times a day followed by coughing exercises  -  Follow-up If symptoms do not improve in 7-10 days consider abx and or CXR    COPD (chronic obstructive pulmonary disease) (H[facility]) -  Stable on prn SABA - No signs of exacerbations - Encourage smoking cessation     Current every day smoker - Current light smoker. Patient has 22 pack year hx  - Refer Lung cancer screening clinic (we will help you call insurance and ask if covered d/t pack year history requirement change )  Pulmonary nodule, right - CT chest 04/04/19 showed stable partially calcified right apical nodule, likely postinflammatory  - Refer to lung cancer screening program   >30 mins spent face to face with patient    Martyn Ehrich, NP 07/08/2019

## 2019-07-05 NOTE — Progress Notes (Signed)
Patient seen in the office today and instructed on use of flutter.  Patient expressed understanding and demonstrated technique.  

## 2019-07-08 NOTE — Assessment & Plan Note (Signed)
-   Current light smoker. Patient has 22 pack year hx  - Refer Lung cancer screening clinic (we will help you call insurance and ask if covered d/t pack year history requirement change )

## 2019-07-08 NOTE — Assessment & Plan Note (Signed)
-   Current light smoker. No signs of bacterial infections. Cough more consistent with UACS vs LPR - Recommend over the counter mucinex twice a day for 10 days with FULL glass of water - Continue Flonase nasal spray per ENT  - Continue Dexilant per GI  - Rx Singulair 10mg  at bedtime  - Orders Use Flutter valve for 20 min 3-4 times a day followed by coughing exercises  - Follow-up If symptoms do not improve in 7-10 days consider abx and or CXR

## 2019-07-08 NOTE — Assessment & Plan Note (Addendum)
-   CT chest 04/04/19 showed stable partially calcified right apical nodule, likely postinflammatory  - Refer to lung cancer screening program

## 2019-07-08 NOTE — Assessment & Plan Note (Signed)
-   Stable on prn SABA - No signs of exacerbations - Encourage smoking cessation

## 2019-07-17 ENCOUNTER — Other Ambulatory Visit: Payer: PPO

## 2019-07-18 DIAGNOSIS — M25562 Pain in left knee: Secondary | ICD-10-CM | POA: Diagnosis not present

## 2019-07-20 DIAGNOSIS — F319 Bipolar disorder, unspecified: Secondary | ICD-10-CM | POA: Diagnosis not present

## 2019-07-24 ENCOUNTER — Other Ambulatory Visit: Payer: Self-pay | Admitting: Internal Medicine

## 2019-07-24 ENCOUNTER — Ambulatory Visit: Payer: PPO | Admitting: Nurse Practitioner

## 2019-07-25 DIAGNOSIS — M5136 Other intervertebral disc degeneration, lumbar region: Secondary | ICD-10-CM | POA: Diagnosis not present

## 2019-07-25 DIAGNOSIS — G894 Chronic pain syndrome: Secondary | ICD-10-CM | POA: Diagnosis not present

## 2019-07-25 DIAGNOSIS — M545 Low back pain: Secondary | ICD-10-CM | POA: Diagnosis not present

## 2019-07-26 ENCOUNTER — Other Ambulatory Visit: Payer: PPO

## 2019-07-27 DIAGNOSIS — M170 Bilateral primary osteoarthritis of knee: Secondary | ICD-10-CM | POA: Diagnosis not present

## 2019-07-27 DIAGNOSIS — M25562 Pain in left knee: Secondary | ICD-10-CM | POA: Diagnosis not present

## 2019-07-27 DIAGNOSIS — M1711 Unilateral primary osteoarthritis, right knee: Secondary | ICD-10-CM | POA: Diagnosis not present

## 2019-07-27 DIAGNOSIS — M1712 Unilateral primary osteoarthritis, left knee: Secondary | ICD-10-CM | POA: Diagnosis not present

## 2019-08-02 ENCOUNTER — Ambulatory Visit: Payer: PPO | Admitting: Nurse Practitioner

## 2019-08-08 ENCOUNTER — Telehealth: Payer: Self-pay

## 2019-08-08 NOTE — Telephone Encounter (Signed)
New message   Need a referral to [facility]   Physician Dr. Daneen Schick   Reason: previous symptoms are coming back again - pain in left chest/arm hurts.

## 2019-08-10 NOTE — Telephone Encounter (Signed)
I left message for patient to call back; we need to get clarification on these symptoms. She may need to go to ER for further evaluation.

## 2019-08-10 NOTE — Telephone Encounter (Signed)
Called and left message for patient to return call to clinic and set up appointment to see Mickel Baas.

## 2019-08-10 NOTE — Telephone Encounter (Signed)
We can refer her to the cardiologist but her symptoms sound more muscular; let's start with an OV here and EKG here please.

## 2019-08-10 NOTE — Telephone Encounter (Signed)
Patient returned your call, she will be at home for the rest of the afternoon.

## 2019-08-10 NOTE — Telephone Encounter (Signed)
Spoke with patient today.  She states she does not have any symptoms today and feels fine. Noted that when she was mowing her lawn yesterday she felt pain in her left arm and tingling sensation into her fingers. It went away and said she felt great today.  She said sometimes she gets pain in her right arm and sometimes her left.  She has asked about referral to see Dr. Daneen Schick.

## 2019-08-13 ENCOUNTER — Telehealth: Payer: Self-pay

## 2019-08-13 NOTE — Telephone Encounter (Signed)
New message    Calling the [place] back from Friday, June 18,2021.

## 2019-08-14 NOTE — Telephone Encounter (Signed)
F/u  Appt on 6.23.2021 @ 10:00am

## 2019-08-15 ENCOUNTER — Ambulatory Visit: Payer: PPO | Admitting: Family

## 2019-08-17 ENCOUNTER — Ambulatory Visit: Payer: PPO | Admitting: Family

## 2019-08-20 ENCOUNTER — Other Ambulatory Visit: Payer: PPO

## 2019-08-21 ENCOUNTER — Ambulatory Visit (INDEPENDENT_AMBULATORY_CARE_PROVIDER_SITE_OTHER): Payer: PPO | Admitting: Family

## 2019-08-21 ENCOUNTER — Encounter: Payer: Self-pay | Admitting: Family

## 2019-08-21 ENCOUNTER — Ambulatory Visit (INDEPENDENT_AMBULATORY_CARE_PROVIDER_SITE_OTHER): Payer: PPO

## 2019-08-21 ENCOUNTER — Other Ambulatory Visit: Payer: Self-pay

## 2019-08-21 ENCOUNTER — Ambulatory Visit: Payer: PPO | Admitting: Family

## 2019-08-21 VITALS — BP 130/72 | HR 84 | Temp 98.4°F | Ht 65.0 in | Wt 206.0 lb

## 2019-08-21 DIAGNOSIS — D3610 Benign neoplasm of peripheral nerves and autonomic nervous system, unspecified: Secondary | ICD-10-CM | POA: Diagnosis not present

## 2019-08-21 DIAGNOSIS — R0789 Other chest pain: Secondary | ICD-10-CM

## 2019-08-21 DIAGNOSIS — J309 Allergic rhinitis, unspecified: Secondary | ICD-10-CM | POA: Diagnosis not present

## 2019-08-21 DIAGNOSIS — M542 Cervicalgia: Secondary | ICD-10-CM | POA: Diagnosis not present

## 2019-08-21 DIAGNOSIS — M5410 Radiculopathy, site unspecified: Secondary | ICD-10-CM

## 2019-08-21 DIAGNOSIS — R9431 Abnormal electrocardiogram [ECG] [EKG]: Secondary | ICD-10-CM

## 2019-08-21 DIAGNOSIS — J90 Pleural effusion, not elsewhere classified: Secondary | ICD-10-CM | POA: Diagnosis not present

## 2019-08-21 MED ORDER — FEXOFENADINE HCL 180 MG PO TABS: 180.0000 mg | ORAL_TABLET | Freq: Every day | ORAL | 11 refills | Status: DC

## 2019-08-21 MED ORDER — FLUTICASONE PROPIONATE 50 MCG/ACT NA SUSP: NASAL | 2 refills | Status: AC

## 2019-08-21 NOTE — Progress Notes (Signed)
[name] [name] is a 59 y.o. female with the following history as recorded in EpicCare:  Patient Active Problem List   Diagnosis Date Noted  . COPD (chronic obstructive pulmonary disease) (HCC) 11/08/2017  . Iron overload 09/22/2017  . Secondary hemochromatosis 09/22/2017  . Allergic rhinitis 09/05/2017  . Hemoptysis 05/26/2017  . Pulmonary nodule, right 11/29/2016  . Polyarthralgia 12/02/2015  . Varicose veins of both lower extremities 12/02/2015  . Hypertensive retinopathy of both eyes 11/24/2015  . Elevated liver enzymes 10/31/2015  . Hyperglycemia 10/31/2015  . SIRS (systemic inflammatory response syndrome) (HCC) 03/11/2013  . Hypertension   . Hyperlipidemia   . Anemia   . Hematochezia 08/14/2012  . ETOH abuse 08/14/2012  . Pseudocyst of pancreas 08/12/2012  . Acute pancreatitis 08/12/2012  . GERD (gastroesophageal reflux disease) 06/08/2012  . Rectal fissure 05/11/2011  . Rectal fistula 05/11/2011  . Hiatal hernia 04/28/2011  . Gastritis 04/28/2011  . Nausea 04/27/2011  . Acute epigastric pain 04/27/2011  . Obesity, Class III, BMI 40-49.9 (morbid obesity) (HCC) 04/27/2011  . Solitary rectal ulcer 04/12/2011  . Hemorrhoids 03/10/2011  . Ulcerative proctitis, nonspecific (HCC) 03/10/2011  . DIVERTICULOSIS, COLON 10/22/2009  . COLONIC POLYPS, HYPERPLASTIC, HX OF 10/22/2009  . INFECTIOUS DIARRHEA 09/27/2008  . ULCERATIVE PROCTITIS 09/27/2008  . NAUSEA AND VOMITING 09/27/2008  . NAUSEA ALONE 09/27/2008  . DIARRHEA 09/27/2008  . ABDOMINAL PAIN -GENERALIZED 09/27/2008  . DEPRESSION 09/12/2007  . HEMORRHOIDS 09/12/2007  . HIATAL HERNIA 09/12/2007  . ULCERATIVE COLITIS 09/12/2007  . RECTAL PAIN 09/12/2007  . BIPOLAR AFFECTIVE DISORDER 12/06/2006  . Current every day smoker 12/06/2006  . Esophageal reflux 12/06/2006  . Cough 12/06/2006    Current Outpatient Medications  Medication Sig Dispense Refill  . ARIPiprazole (ABILIFY) 30 MG tablet     . atorvastatin (LIPITOR)  40 MG tablet TAKE ONE TABLET BY MOUTH DAILY AT 6PM AS DIRECTED 90 tablet 0  . benzonatate (TESSALON) 200 MG capsule TAKE ONE CAPSULE BY MOUTH EVERY 6 HOURS AS NEEDED FOR COUGH 20 capsule 0  . dexlansoprazole (DEXILANT) 60 MG capsule Take 1 capsule (60 mg total) by mouth daily. Office visit for further refills 30 capsule 2  . diclofenac sodium (VOLTAREN) 1 % GEL Apply 2 g topically 4 (four) times daily.     Marland Kitchen FLUoxetine (PROZAC) 20 MG capsule Take 20 mg by mouth 2 (two) times daily.     . fluticasone (FLONASE) 50 MCG/ACT nasal spray SPRAY TWO SPRAYS IN EACH NOSTRIL ONCE DAILY 48 g 2  . HYDROcodone-acetaminophen (NORCO/VICODIN) 5-325 MG tablet Take 1 tablet by mouth 4 (four) times daily as needed for moderate pain.     Marland Kitchen lamoTRIgine (LAMICTAL) 100 MG tablet Take 100 mg by mouth 2 (two) times daily.     . methocarbamol (ROBAXIN) 500 MG tablet Take 500 mg by mouth 2 (two) times daily as needed for muscle spasms.     . montelukast (SINGULAIR) 10 MG tablet Take 1 tablet (10 mg total) by mouth at bedtime. 30 tablet 3  . Multiple Vitamin (MULTIVITAMIN WITH MINERALS) TABS tablet Take 1 tablet by mouth daily. 30 tablet 0  . NARCAN 4 MG/0.1ML LIQD nasal spray kit Place 0.4 mg into the nose once.     . promethazine (PHENERGAN) 25 MG suppository INSERT ONE SUPPOSITORY RECTALLY EVERY 6 HOURS AS NEEDED FOR NAUSEA AND VOMITING 30 suppository 1  . Respiratory Therapy Supplies (FLUTTER) DEVI 3 puffs by Does not apply route 4 (four) times daily as needed. Needs to use  device 20 min 3-4 times a day for coughing 1 each 0  . traMADol (ULTRAM) 50 MG tablet     . fexofenadine (ALLEGRA) 180 MG tablet Take 1 tablet (180 mg total) by mouth daily. 30 tablet 11  . predniSONE (DELTASONE) 20 MG tablet Take 1 tablet (20 mg total) by mouth daily with breakfast. (Patient not taking: Reported on 08/21/2019) 5 tablet 0   No current facility-administered medications for this visit.    Allergies: Patient has no known allergies.  Past  Medical History:  Diagnosis Date  . ABDOMINAL PAIN -GENERALIZED 09/27/2008   Qualifier: Diagnosis of  By: Trellis Paganini PA-c, Amy S   . Acute epigastric pain 04/27/2011  . Acute pancreatitis 08/12/2012  . Allergic rhinitis 09/05/2017  . Anal fistula   . Anemia   . Anxiety   . Arthritis   . BIPOLAR AFFECTIVE DISORDER 12/06/2006   Qualifier: History of  By: Elsworth Soho MD, Leanna Sato Bipolar disorder ([place])   . Chronic kidney disease   . COPD (chronic obstructive pulmonary disease) ([place]) 11/08/2017  . Cough 12/06/2006   Annotation: chronic since'96, nml spirometry, +ve methacholine challenge, nml  CT sinuses, inconclusive pH probe, esophageal manometry Centricity Description: SYMPTOM, COUGH Qualifier: History of  By: Elsworth Soho MD, Leanna Sato   Centricity Description: COUGH Qualifier: Diagnosis of  By: Elsworth Soho MD, Leanna Sato    . Depressive disorder, not elsewhere classified   . Diarrhea 09/27/2008   Qualifier: Diagnosis of  By: Trellis Paganini PA-c, Amy S   . Diverticulosis of colon (without mention of hemorrhage)   . Elevated liver enzymes 10/31/2015  . Esophageal reflux   . ETOH abuse 08/14/2012  . Fatty liver   . Gastritis 04/28/2011  . GERD (gastroesophageal reflux disease)   . Hematochezia 08/14/2012  . Hemoptysis 05/26/2017  . HEMORRHOIDS 09/12/2007   Qualifier: Diagnosis of  By: Nils Pyle CMA ([place]), Mearl Latin    . Hepatic steatosis   . Hiatal hernia   . HIATAL HERNIA 09/12/2007   Qualifier: Diagnosis of  By: Nils Pyle CMA ([place]), Mearl Latin    . History of recurrent UTIs   . Hyperlipidemia   . Hypertension   . Hypertensive retinopathy of both eyes 11/24/2015  . INFECTIOUS DIARRHEA 09/27/2008   Qualifier: Diagnosis of  By: Laney Potash, Pam    . Iron deficiency   . Iron overload 09/22/2017  . Nausea alone 09/27/2008   Qualifier: Diagnosis of  By: Laney Potash, [place] 09/27/2008   Qualifier: Diagnosis of  By: Shane Crutch, Amy S   . Obesity   . Obesity, Class III, BMI 40-49.9 (morbid obesity) ([place])  04/27/2011  . Pancreatitis   . Pancreatitis   . Personal history of colonic polyps 10/16/2009   hyperplastic  . Polyarthralgia 12/02/2015   Evaluated by Dr. Charlestine Night (rheumatologist) 12/29/2015:1): 1)possible fibromyalgia (Suggest physical therapy and flexeril), 2) Polyarthralgia/Insomnia (suggest use of robaxin or flexeril), 3) Nicotine Dependence (counseled to quit). 4)Depression (continue therapy with psychiatrist)  . Pre-diabetes   . Pseudocyst of pancreas 08/12/2012  . Pulmonary nodule, right 11/29/2016  . Rectal fissure 05/11/2011  . Rectal fistula 05/11/2011  . RECTAL PAIN 09/12/2007   Qualifier: Diagnosis of  By: Nils Pyle CMA ([place]), Mearl Latin    . Secondary hemochromatosis 09/22/2017  . Seizures ([place])   . SIRS (systemic inflammatory response syndrome) ([place]) 03/11/2013  . Solitary rectal ulcer 04/12/2011  . TOBACCO ABUSE 12/06/2006   Qualifier: History of  By: Elsworth Soho MD, Leanna Sato    Marland Kitchen  Ulcerative colitis, unspecified   . Ulcerative proctitis, nonspecific ([place]) 03/10/2011  . Varicose veins of both lower extremities 12/02/2015    Past Surgical History:  Procedure Laterality Date  . FINGER AMPUTATION  2010   right index  . HEMORROIDECTOMY    . KNEE ARTHROSCOPY     bilateral, left x2  . TOTAL ABDOMINAL HYSTERECTOMY    . VIDEO BRONCHOSCOPY Bilateral 05/31/2017   Procedure: VIDEO BRONCHOSCOPY WITHOUT FLUORO;  Surgeon: Collene Gobble, MD;  Location: [facility];  Service: Cardiopulmonary;  Laterality: Bilateral;    Family History  Problem Relation Age of Onset  . Irritable bowel syndrome Mother   . Hypertension Mother   . Atrial fibrillation Mother   . Ovarian cancer Maternal Aunt   . Diabetes Father   . Heart attack Father   . Hypertension Father   . Hyperlipidemia Father   . Thyroid disease Sister   . Hyperlipidemia Sister   . Thyroid disease Brother   . Colon polyps Brother   . Stomach cancer Neg Hx   . Colon cancer Neg Hx     Social History   Tobacco Use  . Smoking status:  Current Every Day Smoker    Packs/day: 0.25    Types: Cigars, Cigarettes  . Smokeless tobacco: Never Used  . Tobacco comment: pt states she smokes 3 cigarettes/ day. Trying to quit. Taking Chantix  Substance Use Topics  . Alcohol use: Yes    Alcohol/week: 1.0 standard drink    Types: 1 Shots of liquor per week    Comment: occ    Subjective:  Patient has been having intermittent episodes of localized chest pain; describes it as a sharp pain accompanied by "aching" sensation down into her arm; has been referred to cardiologist in the past but not able to keep that appointment. Symptoms did occur 2 weeks ago while she was cutting the grass;  Also requesting referral to podiatrist about 'lump" on bottom of her left foot;       Objective:  Vitals:   08/21/19 1216  BP: 130/72  Pulse: 84  Temp: 98.4 F (36.9 C)  TempSrc: Oral  SpO2: 96%  Weight: 206 lb (93.4 kg)  Height: '5\' 5"'$  (1.651 m)    General: Well developed, well nourished, in no acute distress  Skin : Warm and dry.  Head: Normocephalic and atraumatic  Eyes: Sclera and conjunctiva clear; pupils round and reactive to light; extraocular movements intact  Ears: External normal; canals clear; tympanic membranes normal  Oropharynx: Pink, supple. No suspicious lesions  Neck: Supple without thyromegaly, adenopathy  Lungs: Respirations unlabored; clear to auscultation bilaterally without wheeze, rales, rhonchi  CVS exam: normal rate and regular rhythm.  Musculoskeletal: No deformities; no active joint inflammation  Extremities: No edema, cyanosis, clubbing  Vessels: Symmetric bilaterally  Neurologic: Alert and oriented; speech intact; face symmetrical; moves all extremities well; CNII-XII intact without focal deficit  Assessment:  1. Atypical chest pain   2. Chronic allergic rhinitis   3. Radiculopathy, unspecified spinal region   4. Neuroma     Plan:  1. Update EKG- shows normal sinus rhythm but symptoms/ risk factors are  concerning; update CXR today; refer back to cardiology; 2. Rx for Flonase and Allegra; 3. Update cervical X-ray; 4. Refer to podiatrist;   This visit occurred during the SARS-CoV-2 public health emergency.  Safety protocols were in place, including screening questions prior to the visit, additional usage of staff PPE, and extensive cleaning of exam room while observing appropriate  contact time as indicated for disinfecting solutions.     No follow-ups on file.  Orders Placed This Encounter  Procedures  . DG Chest 2 View    Standing Status:   Future    Number of Occurrences:   1    Standing Expiration Date:   08/20/2020    Order Specific Question:   Reason for Exam (SYMPTOM  OR DIAGNOSIS REQUIRED)    Answer:   atypical chest pain    Order Specific Question:   Is patient pregnant?    Answer:   No    Order Specific Question:   Preferred imaging location?    Answer:   [facility]    Order Specific Question:   Radiology Contrast Protocol - do NOT remove file path    Answer:   \\[email or web address]s.pdf  . DG Cervical Spine 2 or 3 views    Standing Status:   Future    Number of Occurrences:   1    Standing Expiration Date:   08/20/2020    Order Specific Question:   Reason for Exam (SYMPTOM  OR DIAGNOSIS REQUIRED)    Answer:   left arm numbness and tingling/ neck pain    Order Specific Question:   Is patient pregnant?    Answer:   No    Order Specific Question:   Preferred imaging location?    Answer:   [facility]    Order Specific Question:   Radiology Contrast Protocol - do NOT remove file path    Answer:   \\[email or web address]s.pdf  . Ambulatory referral to Podiatry    Referral Priority:   Routine    Referral Type:   Consultation    Referral Reason:   Specialty Services Required    Requested Specialty:   Podiatry    Number of Visits Requested:   1  . EKG 12-Lead    Requested Prescriptions    Signed Prescriptions Disp Refills  . fluticasone (FLONASE) 50 MCG/ACT nasal spray 48 g 2    Sig: SPRAY TWO SPRAYS IN EACH NOSTRIL ONCE DAILY  . fexofenadine (ALLEGRA) 180 MG tablet 30 tablet 11    Sig: Take 1 tablet (180 mg total) by mouth daily.

## 2019-08-23 ENCOUNTER — Other Ambulatory Visit: Payer: Self-pay | Admitting: Family

## 2019-08-23 ENCOUNTER — Telehealth: Payer: Self-pay | Admitting: Internal Medicine

## 2019-08-23 DIAGNOSIS — E782 Mixed hyperlipidemia: Secondary | ICD-10-CM

## 2019-08-23 NOTE — Telephone Encounter (Signed)
Pt states that she has been experiencing some bloody and tarry stools. She would like to be seen asap.

## 2019-08-24 ENCOUNTER — Telehealth: Payer: Self-pay

## 2019-08-24 NOTE — Telephone Encounter (Signed)
New message    The patient wants to know should she continue taken atorvastatin (LIPITOR) 40 MG tablet.   Cardiologist appt on 7.14.2021

## 2019-08-24 NOTE — Telephone Encounter (Signed)
Called pt back and pt stated that she was having trouble  taking the atorvastatin. Pt wanted to know if she can take it at night. Informed patient that it would be fine to take with her other night meds for ease of remembering.

## 2019-08-24 NOTE — Telephone Encounter (Signed)
Pt states she is passing some dark stool and she reports there has been some blood in the stool. Discussed with pt that there are no soon appts available and that she should see her PCP. Pt wants appt scheduled with an APP. Pt scheduled to see Tye Savoy NP 09/19/19@9 :30am. Pt aware of appt and knows if she gets worse she should see PCP or Urgent care.

## 2019-08-28 ENCOUNTER — Other Ambulatory Visit: Payer: PPO

## 2019-08-28 DIAGNOSIS — M545 Low back pain: Secondary | ICD-10-CM | POA: Diagnosis not present

## 2019-08-28 DIAGNOSIS — G894 Chronic pain syndrome: Secondary | ICD-10-CM | POA: Diagnosis not present

## 2019-08-28 DIAGNOSIS — M7060 Trochanteric bursitis, unspecified hip: Secondary | ICD-10-CM | POA: Diagnosis not present

## 2019-08-28 DIAGNOSIS — M5136 Other intervertebral disc degeneration, lumbar region: Secondary | ICD-10-CM | POA: Diagnosis not present

## 2019-08-29 ENCOUNTER — Other Ambulatory Visit: Payer: PPO

## 2019-08-29 ENCOUNTER — Telehealth: Payer: Self-pay

## 2019-08-29 ENCOUNTER — Other Ambulatory Visit: Payer: Self-pay | Admitting: Family

## 2019-08-29 DIAGNOSIS — M542 Cervicalgia: Secondary | ICD-10-CM

## 2019-08-29 NOTE — Telephone Encounter (Signed)
New message    Spoke with pain management [name] should proceed with getting the MRI first.   Pain management will follow up with injection if needed.   The patient is asking can [name] [name] enter the referral into the epic system.

## 2019-08-29 NOTE — Telephone Encounter (Signed)
Can you put in the referral that he is requesting? Thanks!

## 2019-08-30 ENCOUNTER — Other Ambulatory Visit: Payer: Self-pay | Admitting: Family

## 2019-08-30 DIAGNOSIS — E782 Mixed hyperlipidemia: Secondary | ICD-10-CM

## 2019-09-03 ENCOUNTER — Other Ambulatory Visit: Payer: Self-pay

## 2019-09-03 ENCOUNTER — Ambulatory Visit
Admission: RE | Admit: 2019-09-03 | Discharge: 2019-09-03 | Disposition: A | Discharge status: Home / Self Care | Payer: PPO | Source: Ambulatory Visit | Attending: Family | Admitting: Family

## 2019-09-03 DIAGNOSIS — M542 Cervicalgia: Secondary | ICD-10-CM

## 2019-09-03 DIAGNOSIS — M4802 Spinal stenosis, cervical region: Secondary | ICD-10-CM | POA: Diagnosis not present

## 2019-09-04 ENCOUNTER — Telehealth: Payer: Self-pay | Admitting: Family

## 2019-09-04 DIAGNOSIS — M25562 Pain in left knee: Secondary | ICD-10-CM | POA: Diagnosis not present

## 2019-09-04 DIAGNOSIS — M1712 Unilateral primary osteoarthritis, left knee: Secondary | ICD-10-CM | POA: Diagnosis not present

## 2019-09-04 NOTE — Telephone Encounter (Signed)
New message:   Fax [phone] Dr/ Mohammed Kindle

## 2019-09-04 NOTE — Progress Notes (Deleted)
Cardiology Office Note:    Date:  09/04/2019   ID:  [name] [name], DOB [birth year], MRN [id number]  PCP:  [name] Salvage, FNP  Cardiologist:  [name] Grooms, MD  Electrophysiologist:  None   Referring MD: [name] [name],*   No chief complaint on file. ***  History of Present Illness:    [name] [name] is a 59 y.o. female with a hx of COPD,  hypertension, hyperlipidemia, alcohol abuse who is referred for evaluation of chest pain.  Past Medical History:  Diagnosis Date  . ABDOMINAL PAIN -GENERALIZED 09/27/2008   Qualifier: Diagnosis of  By: Trellis Paganini PA-c, Amy S   . Acute epigastric pain 04/27/2011  . Acute pancreatitis 08/12/2012  . Allergic rhinitis 09/05/2017  . Anal fistula   . Anemia   . Anxiety   . Arthritis   . BIPOLAR AFFECTIVE DISORDER 12/06/2006   Qualifier: History of  By: Elsworth Soho MD, Leanna Sato Bipolar disorder ([place])   . Chronic kidney disease   . COPD (chronic obstructive pulmonary disease) ([place]) 11/08/2017  . Cough 12/06/2006   Annotation: chronic since'96, nml spirometry, +ve methacholine challenge, nml  CT sinuses, inconclusive pH probe, esophageal manometry Centricity Description: SYMPTOM, COUGH Qualifier: History of  By: Elsworth Soho MD, Leanna Sato   Centricity Description: COUGH Qualifier: Diagnosis of  By: Elsworth Soho MD, Leanna Sato    . Depressive disorder, not elsewhere classified   . Diarrhea 09/27/2008   Qualifier: Diagnosis of  By: Trellis Paganini PA-c, Amy S   . Diverticulosis of colon (without mention of hemorrhage)   . Elevated liver enzymes 10/31/2015  . Esophageal reflux   . ETOH abuse 08/14/2012  . Fatty liver   . Gastritis 04/28/2011  . GERD (gastroesophageal reflux disease)   . Hematochezia 08/14/2012  . Hemoptysis 05/26/2017  . HEMORRHOIDS 09/12/2007   Qualifier: Diagnosis of  By: Nils Pyle CMA ([place]), Mearl Latin    . Hepatic steatosis   . Hiatal hernia   . HIATAL HERNIA 09/12/2007   Qualifier: Diagnosis of  By: Nils Pyle CMA ([place]), Mearl Latin    . History of  recurrent UTIs   . Hyperlipidemia   . Hypertension   . Hypertensive retinopathy of both eyes 11/24/2015  . INFECTIOUS DIARRHEA 09/27/2008   Qualifier: Diagnosis of  By: Laney Potash, Pam    . Iron deficiency   . Iron overload 09/22/2017  . Nausea alone 09/27/2008   Qualifier: Diagnosis of  By: Laney Potash, [place] 09/27/2008   Qualifier: Diagnosis of  By: Shane Crutch, Amy S   . Obesity   . Obesity, Class III, BMI 40-49.9 (morbid obesity) ([place]) 04/27/2011  . Pancreatitis   . Pancreatitis   . Personal history of colonic polyps 10/16/2009   hyperplastic  . Polyarthralgia 12/02/2015   Evaluated by Dr. Charlestine Night (rheumatologist) 12/29/2015:1): 1)possible fibromyalgia (Suggest physical therapy and flexeril), 2) Polyarthralgia/Insomnia (suggest use of robaxin or flexeril), 3) Nicotine Dependence (counseled to quit). 4)Depression (continue therapy with psychiatrist)  . Pre-diabetes   . Pseudocyst of pancreas 08/12/2012  . Pulmonary nodule, right 11/29/2016  . Rectal fissure 05/11/2011  . Rectal fistula 05/11/2011  . RECTAL PAIN 09/12/2007   Qualifier: Diagnosis of  By: Nils Pyle CMA ([place]), Mearl Latin    . Secondary hemochromatosis 09/22/2017  . Seizures ([place])   . SIRS (systemic inflammatory response syndrome) ([place]) 03/11/2013  . Solitary rectal ulcer 04/12/2011  . TOBACCO ABUSE 12/06/2006   Qualifier: History of  By: Elsworth Soho MD, Leanna Sato    Marland Kitchen  Ulcerative colitis, unspecified   . Ulcerative proctitis, nonspecific ([place]) 03/10/2011  . Varicose veins of both lower extremities 12/02/2015    Past Surgical History:  Procedure Laterality Date  . FINGER AMPUTATION  2010   right index  . HEMORROIDECTOMY    . KNEE ARTHROSCOPY     bilateral, left x2  . TOTAL ABDOMINAL HYSTERECTOMY    . VIDEO BRONCHOSCOPY Bilateral 05/31/2017   Procedure: VIDEO BRONCHOSCOPY WITHOUT FLUORO;  Surgeon: Collene Gobble, MD;  Location: [facility];  Service: Cardiopulmonary;  Laterality: Bilateral;    Current  Medications: No outpatient medications have been marked as taking for the 09/05/19 encounter (Appointment) with Donato Heinz, MD.     Allergies:   Patient has no known allergies.   Social History   Socioeconomic History  . Marital status: Single    Spouse name: Not on file  . Number of children: 0  . Years of education: 2  . Highest education level: Not on file  Occupational History  . Occupation: produce Armed forces operational officer: UNEMPLOYED  Tobacco Use  . Smoking status: Current Every Day Smoker    Packs/day: 0.25    Types: Cigars, Cigarettes  . Smokeless tobacco: Never Used  . Tobacco comment: pt states she smokes 3 cigarettes/ day. Trying to quit. Taking Chantix  Vaping Use  . Vaping Use: Never used  Substance and Sexual Activity  . Alcohol use: Yes    Alcohol/week: 1.0 standard drink    Types: 1 Shots of liquor per week    Comment: occ  . Drug use: No  . Sexual activity: Not Currently  Other Topics Concern  . Not on file  Social History Narrative  . Not on file   Social Determinants of Health   Financial Resource Strain:   . Difficulty of Paying Living Expenses:   Food Insecurity:   . Worried About Charity fundraiser in the Last Year:   . Arboriculturist in the Last Year:   Transportation Needs:   . Film/video editor (Medical):   Marland Kitchen Lack of Transportation (Non-Medical):   Physical Activity:   . Days of Exercise per Week:   . Minutes of Exercise per Session:   Stress:   . Feeling of Stress :   Social Connections:   . Frequency of Communication with Friends and Family:   . Frequency of Social Gatherings with Friends and Family:   . Attends Religious Services:   . Active Member of Clubs or Organizations:   . Attends Archivist Meetings:   Marland Kitchen Marital Status:      Family History: The patient's ***family history includes Atrial fibrillation in her mother; Colon polyps in her brother; Diabetes in her father; Heart attack in her father;  Hyperlipidemia in her father and sister; Hypertension in her father and mother; Irritable bowel syndrome in her mother; Ovarian cancer in her maternal aunt; Thyroid disease in her brother and sister. There is no history of Stomach cancer or Colon cancer.  ROS:   Please see the history of present illness.    *** All other systems reviewed and are negative.  EKGs/Labs/Other Studies Reviewed:    The following studies were reviewed today: ***  EKG:  EKG is *** ordered today.  The ekg ordered today demonstrates ***  Recent Labs: 09/05/2018: TSH 1.64 03/27/2019: ALT 44; BUN 9; Creatinine, Ser 0.71; Hemoglobin 11.4; Platelets 163.0; Potassium 3.7; Sodium 133  Recent Lipid Panel    Component Value Date/Time  CHOL 302 (H) 02/07/2018 1013   TRIG 165.0 (H) 02/07/2018 1013   HDL 107.20 02/07/2018 1013   CHOLHDL 3 02/07/2018 1013   VLDL 33.0 02/07/2018 1013   LDLCALC 162 (H) 02/07/2018 1013   LDLDIRECT 192.5 06/08/2012 1106    Physical Exam:    VS:  There were no vitals taken for this visit.    Wt Readings from Last 3 Encounters:  08/21/19 206 lb (93.4 kg)  07/05/19 202 lb (91.6 kg)  03/27/19 204 lb 12.8 oz (92.9 kg)     GEN: *** Well nourished, well developed in no acute distress HEENT: Normal NECK: No JVD; No carotid bruits LYMPHATICS: No lymphadenopathy CARDIAC: ***RRR, no murmurs, rubs, gallops RESPIRATORY:  Clear to auscultation without rales, wheezing or rhonchi  ABDOMEN: Soft, non-tender, non-distended MUSCULOSKELETAL:  No edema; No deformity  SKIN: Warm and dry NEUROLOGIC:  Alert and oriented x 3 PSYCHIATRIC:  Normal affect   ASSESSMENT:    No diagnosis found. PLAN:    Chest pain:  Hyperlipidemia: On atorvastatin 40 mg daily   RTC in***   Medication Adjustments/Labs and Tests Ordered: Current medicines are reviewed at length with the patient today.  Concerns regarding medicines are outlined above.  No orders of the defined types were placed in this  encounter.  No orders of the defined types were placed in this encounter.   There are no Patient Instructions on file for this visit.   Signed, Donato Heinz, MD  09/04/2019 10:48 PM    [place]

## 2019-09-04 NOTE — Telephone Encounter (Signed)
Called pt back concerning [name] [name] response on MRI. (see MRI rpt). Pt will send mychart msg with her pain management provider to fax report to him as well...[name] [name]

## 2019-09-04 NOTE — Telephone Encounter (Signed)
New message:   Pt is calling and would like a call to discuss her test results. Please advise.

## 2019-09-05 ENCOUNTER — Ambulatory Visit: Payer: PPO | Admitting: Cardiology

## 2019-09-05 NOTE — Telephone Encounter (Signed)
Faxed MRI report to Dr. Primus Bravo @ [phone].Marland KitchenJohny Chess

## 2019-09-06 ENCOUNTER — Telehealth: Payer: Self-pay | Admitting: Podiatry

## 2019-09-06 ENCOUNTER — Ambulatory Visit (INDEPENDENT_AMBULATORY_CARE_PROVIDER_SITE_OTHER): Payer: PPO

## 2019-09-06 ENCOUNTER — Other Ambulatory Visit: Payer: Self-pay

## 2019-09-06 ENCOUNTER — Other Ambulatory Visit: Payer: Self-pay | Admitting: Podiatry

## 2019-09-06 ENCOUNTER — Ambulatory Visit (INDEPENDENT_AMBULATORY_CARE_PROVIDER_SITE_OTHER): Payer: PPO | Admitting: Podiatry

## 2019-09-06 ENCOUNTER — Encounter: Payer: Self-pay | Admitting: Podiatry

## 2019-09-06 DIAGNOSIS — M778 Other enthesopathies, not elsewhere classified: Secondary | ICD-10-CM

## 2019-09-06 DIAGNOSIS — M7750 Other enthesopathy of unspecified foot: Secondary | ICD-10-CM

## 2019-09-06 DIAGNOSIS — M722 Plantar fascial fibromatosis: Secondary | ICD-10-CM | POA: Diagnosis not present

## 2019-09-06 NOTE — Telephone Encounter (Signed)
Left message informing pt it was not unusual to have numbness and tingling in her feet after an injections.

## 2019-09-06 NOTE — Telephone Encounter (Signed)
Pt called and states she had injections today in both feet and is experiencing numbness and tingling in both feet now. Pt concerned whether this is normal or not. Please give patient a call.

## 2019-09-06 NOTE — Progress Notes (Signed)
Subjective:  Patient ID: [name] [name], female    DOB: September 16, 1960,  MRN: [id number] HPI Chief Complaint  Patient presents with   Foot Pain    Arch left - lump x 1 year, keeps getting larger and more tender   Foot Pain    Plantar forefoot bilateral - aching x few weeks, worse at night, pain management with Dr. Lavonia Drafts norco and tramadol daily   New Patient (Initial Visit)    59 y.o. female presents with the above complaint.   ROS: Denies fever chills nausea vomiting muscle aches pains calf pain back pain chest pain shortness of breath.  Past Medical History:  Diagnosis Date   ABDOMINAL PAIN -GENERALIZED 09/27/2008   Qualifier: Diagnosis of  By: Trellis Paganini PA-c, Amy S    Acute epigastric pain 04/27/2011   Acute pancreatitis 08/12/2012   Allergic rhinitis 09/05/2017   Anal fistula    Anemia    Anxiety    Arthritis    BIPOLAR AFFECTIVE DISORDER 12/06/2006   Qualifier: History of  By: Elsworth Soho MD, Leanna Sato     Bipolar disorder ([place])    Chronic kidney disease    COPD (chronic obstructive pulmonary disease) ([place]) 11/08/2017   Cough 12/06/2006   Annotation: chronic since'96, nml spirometry, +ve methacholine challenge, nml  CT sinuses, inconclusive pH probe, esophageal manometry Centricity Description: SYMPTOM, COUGH Qualifier: History of  By: Elsworth Soho MD, Leanna Sato   Centricity Description: COUGH Qualifier: Diagnosis of  By: Elsworth Soho MD, Leanna Sato     Depressive disorder, not elsewhere classified    Diarrhea 09/27/2008   Qualifier: Diagnosis of  By: Shane Crutch, Amy S    Diverticulosis of colon (without mention of hemorrhage)    Elevated liver enzymes 10/31/2015   Esophageal reflux    ETOH abuse 08/14/2012   Fatty liver    Gastritis 04/28/2011   GERD (gastroesophageal reflux disease)    Hematochezia 08/14/2012   Hemoptysis 05/26/2017   HEMORRHOIDS 09/12/2007   Qualifier: Diagnosis of  By: Nils Pyle CMA (AAMA), Leisha     Hepatic steatosis    Hiatal hernia    HIATAL  HERNIA 09/12/2007   Qualifier: Diagnosis of  By: Nils Pyle CMA (AAMA), Mearl Latin     History of recurrent UTIs    Hyperlipidemia    Hypertension    Hypertensive retinopathy of both eyes 11/24/2015   INFECTIOUS DIARRHEA 09/27/2008   Qualifier: Diagnosis of  By: Laney Potash, Pam     Iron deficiency    Iron overload 09/22/2017   Nausea alone 09/27/2008   Qualifier: Diagnosis of  By: Laney Potash, Pam     NAUSEA AND VOMITING 09/27/2008   Qualifier: Diagnosis of  By: Shane Crutch, Amy S    Obesity    Obesity, Class III, BMI 40-49.9 (morbid obesity) ([place]) 04/27/2011   Pancreatitis    Pancreatitis    Personal history of colonic polyps 10/16/2009   hyperplastic   Polyarthralgia 12/02/2015   Evaluated by Dr. Charlestine Night (rheumatologist) 12/29/2015:1): 1)possible fibromyalgia (Suggest physical therapy and flexeril), 2) Polyarthralgia/Insomnia (suggest use of robaxin or flexeril), 3) Nicotine Dependence (counseled to quit). 4)Depression (continue therapy with psychiatrist)   Pre-diabetes    Pseudocyst of pancreas 08/12/2012   Pulmonary nodule, right 11/29/2016   Rectal fissure 05/11/2011   Rectal fistula 05/11/2011   RECTAL PAIN 09/12/2007   Qualifier: Diagnosis of  By: Nils Pyle CMA (AAMA), Leisha     Secondary hemochromatosis 09/22/2017   Seizures ([place])    SIRS (systemic inflammatory response syndrome) ([place]) 03/11/2013   Solitary  rectal ulcer 04/12/2011   TOBACCO ABUSE 12/06/2006   Qualifier: History of  By: Elsworth Soho MD, Leanna Sato     Ulcerative colitis, unspecified    Ulcerative proctitis, nonspecific ([place]) 03/10/2011   Varicose veins of both lower extremities 12/02/2015   Past Surgical History:  Procedure Laterality Date   FINGER AMPUTATION  2010   right index   HEMORROIDECTOMY     KNEE ARTHROSCOPY     bilateral, left x2   TOTAL ABDOMINAL HYSTERECTOMY     VIDEO BRONCHOSCOPY Bilateral 05/31/2017   Procedure: VIDEO BRONCHOSCOPY WITHOUT FLUORO;  Surgeon: Collene Gobble, MD;   Location: [facility];  Service: Cardiopulmonary;  Laterality: Bilateral;    Current Outpatient Medications:    ARIPiprazole (ABILIFY) 30 MG tablet, , Disp: , Rfl:    atorvastatin (LIPITOR) 40 MG tablet, TAKE ONE TABLET BY MOUTH DAILY AT 6PM AS DIRECTED, Disp: 90 tablet, Rfl: 0   benzonatate (TESSALON) 200 MG capsule, TAKE ONE CAPSULE BY MOUTH EVERY 6 HOURS AS NEEDED FOR COUGH, Disp: 20 capsule, Rfl: 0   dexlansoprazole (DEXILANT) 60 MG capsule, Take 1 capsule (60 mg total) by mouth daily. Office visit for further refills, Disp: 30 capsule, Rfl: 2   diclofenac sodium (VOLTAREN) 1 % GEL, Apply 2 g topically 4 (four) times daily. , Disp: , Rfl:    fexofenadine (ALLEGRA) 180 MG tablet, Take 1 tablet (180 mg total) by mouth daily., Disp: 30 tablet, Rfl: 11   FLUoxetine (PROZAC) 20 MG capsule, Take 20 mg by mouth 2 (two) times daily. , Disp: , Rfl:    fluticasone (FLONASE) 50 MCG/ACT nasal spray, SPRAY TWO SPRAYS IN EACH NOSTRIL ONCE DAILY, Disp: 48 g, Rfl: 2   HYDROcodone-acetaminophen (NORCO/VICODIN) 5-325 MG tablet, Take 1 tablet by mouth 4 (four) times daily as needed for moderate pain. , Disp: , Rfl:    lamoTRIgine (LAMICTAL) 100 MG tablet, Take 100 mg by mouth 2 (two) times daily. , Disp: , Rfl:    methocarbamol (ROBAXIN) 500 MG tablet, Take 500 mg by mouth 2 (two) times daily as needed for muscle spasms. , Disp: , Rfl:    montelukast (SINGULAIR) 10 MG tablet, Take 1 tablet (10 mg total) by mouth at bedtime., Disp: 30 tablet, Rfl: 3   Multiple Vitamin (MULTIVITAMIN WITH MINERALS) TABS tablet, Take 1 tablet by mouth daily., Disp: 30 tablet, Rfl: 0   NARCAN 4 MG/0.1ML LIQD nasal spray kit, Place 0.4 mg into the nose once. , Disp: , Rfl:    predniSONE (DELTASONE) 20 MG tablet, Take 1 tablet (20 mg total) by mouth daily with breakfast. (Patient not taking: Reported on 08/21/2019), Disp: 5 tablet, Rfl: 0   promethazine (PHENERGAN) 25 MG suppository, INSERT ONE SUPPOSITORY RECTALLY  EVERY 6 HOURS AS NEEDED FOR NAUSEA AND VOMITING, Disp: 30 suppository, Rfl: 1   Respiratory Therapy Supplies (FLUTTER) DEVI, 3 puffs by Does not apply route 4 (four) times daily as needed. Needs to use device 20 min 3-4 times a day for coughing, Disp: 1 each, Rfl: 0   traMADol (ULTRAM) 50 MG tablet, , Disp: , Rfl:   No Known Allergies Review of Systems Objective:  There were no vitals filed for this visit.  General: Well developed, nourished, in no acute distress, alert and oriented x3   Dermatological: Skin is warm, dry and supple bilateral. Nails x 10 are well maintained; remaining integument appears unremarkable at this time. There are no open sores, no preulcerative lesions, no rash or signs of infection present.  Vascular:  Dorsalis Pedis artery and Posterior Tibial artery pedal pulses are 2/4 bilateral with immedate capillary fill time. Pedal hair growth present. No varicosities and no lower extremity edema present bilateral.   Neruologic: Grossly intact via light touch bilateral. Vibratory intact via tuning fork bilateral. Protective threshold with Semmes Wienstein monofilament intact to all pedal sites bilateral. Patellar and Achilles deep tendon reflexes 2+ bilateral. No Babinski or clonus noted bilateral.   Musculoskeletal: No gross boney pedal deformities bilateral. No pain, crepitus, or limitation noted with foot and ankle range of motion bilateral. Muscular strength 5/5 in all groups tested bilateral.  She has pain on palpation and end range of motion of the second metatarsophalangeal joints bilaterally.  Mild swelling dorsal aspect of the foot.  She also has a plantar fibroma measuring greater than 1 cm but less than 2 medial longitudinal arch left foot less than a centimeter medial longitudinal arch of the right foot.  Gait: Unassisted, Nonantalgic.    Radiographs:  Radiographs taken today demonstrate osseously mature individual.  Normal normal osseous architecture.  Soft  tissue swelling medial longitudinal arch of the left foot none on the right no calcifications identified.  Assessment & Plan:   Assessment: Capsulitis of the second metatarsophalangeal joints bilaterally plantar fibroma bilaterally left greater than right.    Plan: Discussed etiology pathology conservative surgical therapies at this point injected 20 mg Kenalog 5 mg of Marcaine to the plantar fibroma of the left foot did not inject one of the right foot.  Also injected 10 mg of Kenalog 5 mg Marcaine to the second interdigital space of the bilateral feet.  This is around the second metatarsophalangeal joint bilaterally.  We discussed appropriate shoe gear stretching exercises ice therapy shoe gear modifications and stiff soled shoes.  I will follow-up with her and 2 months.     Abbott Jasinski T. [place], [place]

## 2019-09-13 DIAGNOSIS — M25562 Pain in left knee: Secondary | ICD-10-CM | POA: Diagnosis not present

## 2019-09-13 DIAGNOSIS — M1712 Unilateral primary osteoarthritis, left knee: Secondary | ICD-10-CM | POA: Diagnosis not present

## 2019-09-18 ENCOUNTER — Ambulatory Visit: Payer: PPO | Admitting: Family

## 2019-09-19 ENCOUNTER — Ambulatory Visit (INDEPENDENT_AMBULATORY_CARE_PROVIDER_SITE_OTHER): Payer: PPO | Admitting: Family

## 2019-09-19 ENCOUNTER — Ambulatory Visit: Payer: PPO | Admitting: Nurse Practitioner

## 2019-09-19 ENCOUNTER — Encounter: Payer: Self-pay | Admitting: Family

## 2019-09-19 ENCOUNTER — Other Ambulatory Visit: Payer: Self-pay

## 2019-09-19 VITALS — BP 148/84 | HR 88 | Temp 98.5°F | Wt 207.8 lb

## 2019-09-19 DIAGNOSIS — R7309 Other abnormal glucose: Secondary | ICD-10-CM | POA: Diagnosis not present

## 2019-09-19 DIAGNOSIS — R42 Dizziness and giddiness: Secondary | ICD-10-CM

## 2019-09-19 DIAGNOSIS — M25562 Pain in left knee: Secondary | ICD-10-CM | POA: Diagnosis not present

## 2019-09-19 DIAGNOSIS — R5383 Other fatigue: Secondary | ICD-10-CM | POA: Diagnosis not present

## 2019-09-19 DIAGNOSIS — M1712 Unilateral primary osteoarthritis, left knee: Secondary | ICD-10-CM | POA: Diagnosis not present

## 2019-09-19 MED ORDER — LOSARTAN POTASSIUM 50 MG PO TABS
50.0000 mg | ORAL_TABLET | Freq: Every day | ORAL | 1 refills | Status: DC
Start: 2019-09-19 — End: 2019-10-03

## 2019-09-19 NOTE — Progress Notes (Signed)
[name] [name] is a 59 y.o. female with the following history as recorded in EpicCare:  Patient Active Problem List   Diagnosis Date Noted  . Pain in left knee 06/12/2019  . COPD (chronic obstructive pulmonary disease) ([place]) 11/08/2017  . Iron overload 09/22/2017  . Secondary hemochromatosis 09/22/2017  . Allergic rhinitis 09/05/2017  . Hemoptysis 05/26/2017  . Pulmonary nodule, right 11/29/2016  . Polyarthralgia 12/02/2015  . Varicose veins of both lower extremities 12/02/2015  . Hypertensive retinopathy of both eyes 11/24/2015  . Elevated liver enzymes 10/31/2015  . Hyperglycemia 10/31/2015  . SIRS (systemic inflammatory response syndrome) ([place]) 03/11/2013  . Hypertension   . Hyperlipidemia   . Anemia   . Hematochezia 08/14/2012  . ETOH abuse 08/14/2012  . Pseudocyst of pancreas 08/12/2012  . Acute pancreatitis 08/12/2012  . GERD (gastroesophageal reflux disease) 06/08/2012  . Rectal fissure 05/11/2011  . Rectal fistula 05/11/2011  . Hiatal hernia 04/28/2011  . Gastritis 04/28/2011  . Nausea 04/27/2011  . Acute epigastric pain 04/27/2011  . Obesity, Class III, BMI 40-49.9 (morbid obesity) ([place]) 04/27/2011  . Solitary rectal ulcer 04/12/2011  . Hemorrhoids 03/10/2011  . Ulcerative proctitis, nonspecific ([place]) 03/10/2011  . DIVERTICULOSIS, COLON 10/22/2009  . COLONIC POLYPS, HYPERPLASTIC, HX OF 10/22/2009  . INFECTIOUS DIARRHEA 09/27/2008  . ULCERATIVE PROCTITIS 09/27/2008  . NAUSEA AND VOMITING 09/27/2008  . NAUSEA ALONE 09/27/2008  . DIARRHEA 09/27/2008  . ABDOMINAL PAIN -GENERALIZED 09/27/2008  . DEPRESSION 09/12/2007  . HEMORRHOIDS 09/12/2007  . HIATAL HERNIA 09/12/2007  . ULCERATIVE COLITIS 09/12/2007  . RECTAL PAIN 09/12/2007  . BIPOLAR AFFECTIVE DISORDER 12/06/2006  . Current every day smoker 12/06/2006  . Esophageal reflux 12/06/2006  . Cough 12/06/2006    Current Outpatient Medications  Medication Sig Dispense Refill  . ARIPiprazole (ABILIFY) 30 MG  tablet     . atorvastatin (LIPITOR) 40 MG tablet TAKE ONE TABLET BY MOUTH DAILY AT 6PM AS DIRECTED 90 tablet 0  . benzonatate (TESSALON) 200 MG capsule TAKE ONE CAPSULE BY MOUTH EVERY 6 HOURS AS NEEDED FOR COUGH 20 capsule 0  . dexlansoprazole (DEXILANT) 60 MG capsule Take 1 capsule (60 mg total) by mouth daily. Office visit for further refills 30 capsule 2  . diclofenac sodium (VOLTAREN) 1 % GEL Apply 2 g topically 4 (four) times daily.     . fexofenadine (ALLEGRA) 180 MG tablet Take 1 tablet (180 mg total) by mouth daily. 30 tablet 11  . FLUoxetine (PROZAC) 20 MG capsule Take 20 mg by mouth 2 (two) times daily.     . fluticasone (FLONASE) 50 MCG/ACT nasal spray SPRAY TWO SPRAYS IN EACH NOSTRIL ONCE DAILY 48 g 2  . HYDROcodone-acetaminophen (NORCO/VICODIN) 5-325 MG tablet Take 1 tablet by mouth 4 (four) times daily as needed for moderate pain.     Marland Kitchen lamoTRIgine (LAMICTAL) 100 MG tablet Take 100 mg by mouth 2 (two) times daily.     . methocarbamol (ROBAXIN) 500 MG tablet Take 500 mg by mouth 2 (two) times daily as needed for muscle spasms.     . montelukast (SINGULAIR) 10 MG tablet Take 1 tablet (10 mg total) by mouth at bedtime. 30 tablet 3  . Multiple Vitamin (MULTIVITAMIN WITH MINERALS) TABS tablet Take 1 tablet by mouth daily. 30 tablet 0  . NARCAN 4 MG/0.1ML LIQD nasal spray kit Place 0.4 mg into the nose once.     . promethazine (PHENERGAN) 25 MG suppository INSERT ONE SUPPOSITORY RECTALLY EVERY 6 HOURS AS NEEDED FOR NAUSEA AND VOMITING  30 suppository 1  . Respiratory Therapy Supplies (FLUTTER) DEVI 3 puffs by Does not apply route 4 (four) times daily as needed. Needs to use device 20 min 3-4 times a day for coughing 1 each 0  . traMADol (ULTRAM) 50 MG tablet     . losartan (COZAAR) 50 MG tablet Take 1 tablet (50 mg total) by mouth daily. 90 tablet 1   No current facility-administered medications for this visit.    Allergies: Patient has no known allergies.  Past Medical History:   Diagnosis Date  . ABDOMINAL PAIN -GENERALIZED 09/27/2008   Qualifier: Diagnosis of  By: Trellis Paganini PA-c, Amy S   . Acute epigastric pain 04/27/2011  . Acute pancreatitis 08/12/2012  . Allergic rhinitis 09/05/2017  . Anal fistula   . Anemia   . Anxiety   . Arthritis   . BIPOLAR AFFECTIVE DISORDER 12/06/2006   Qualifier: History of  By: Elsworth Soho MD, Leanna Sato Bipolar disorder ([place])   . Chronic kidney disease   . COPD (chronic obstructive pulmonary disease) ([place]) 11/08/2017  . Cough 12/06/2006   Annotation: chronic since'96, nml spirometry, +ve methacholine challenge, nml  CT sinuses, inconclusive pH probe, esophageal manometry Centricity Description: SYMPTOM, COUGH Qualifier: History of  By: Elsworth Soho MD, Leanna Sato   Centricity Description: COUGH Qualifier: Diagnosis of  By: Elsworth Soho MD, Leanna Sato    . Depressive disorder, not elsewhere classified   . Diarrhea 09/27/2008   Qualifier: Diagnosis of  By: Trellis Paganini PA-c, Amy S   . Diverticulosis of colon (without mention of hemorrhage)   . Elevated liver enzymes 10/31/2015  . Esophageal reflux   . ETOH abuse 08/14/2012  . Fatty liver   . Gastritis 04/28/2011  . GERD (gastroesophageal reflux disease)   . Hematochezia 08/14/2012  . Hemoptysis 05/26/2017  . HEMORRHOIDS 09/12/2007   Qualifier: Diagnosis of  By: Nils Pyle CMA ([place]), Mearl Latin    . Hepatic steatosis   . Hiatal hernia   . HIATAL HERNIA 09/12/2007   Qualifier: Diagnosis of  By: Nils Pyle CMA ([place]), Mearl Latin    . History of recurrent UTIs   . Hyperlipidemia   . Hypertension   . Hypertensive retinopathy of both eyes 11/24/2015  . INFECTIOUS DIARRHEA 09/27/2008   Qualifier: Diagnosis of  By: Laney Potash, Pam    . Iron deficiency   . Iron overload 09/22/2017  . Nausea alone 09/27/2008   Qualifier: Diagnosis of  By: Laney Potash, [place] 09/27/2008   Qualifier: Diagnosis of  By: Shane Crutch, Amy S   . Obesity   . Obesity, Class III, BMI 40-49.9 (morbid obesity) ([place]) 04/27/2011  .  Pancreatitis   . Pancreatitis   . Personal history of colonic polyps 10/16/2009   hyperplastic  . Polyarthralgia 12/02/2015   Evaluated by Dr. Charlestine Night (rheumatologist) 12/29/2015:1): 1)possible fibromyalgia (Suggest physical therapy and flexeril), 2) Polyarthralgia/Insomnia (suggest use of robaxin or flexeril), 3) Nicotine Dependence (counseled to quit). 4)Depression (continue therapy with psychiatrist)  . Pre-diabetes   . Pseudocyst of pancreas 08/12/2012  . Pulmonary nodule, right 11/29/2016  . Rectal fissure 05/11/2011  . Rectal fistula 05/11/2011  . RECTAL PAIN 09/12/2007   Qualifier: Diagnosis of  By: Nils Pyle CMA ([place]), Mearl Latin    . Secondary hemochromatosis 09/22/2017  . Seizures ([place])   . SIRS (systemic inflammatory response syndrome) ([place]) 03/11/2013  . Solitary rectal ulcer 04/12/2011  . TOBACCO ABUSE 12/06/2006   Qualifier: History of  By: Elsworth Soho MD, Leanna Sato    Marland Kitchen  Ulcerative colitis, unspecified   . Ulcerative proctitis, nonspecific ([facility]) 03/10/2011  . Varicose veins of both lower extremities 12/02/2015    Past Surgical History:  Procedure Laterality Date  . FINGER AMPUTATION  2010   right index  . HEMORROIDECTOMY    . KNEE ARTHROSCOPY     bilateral, left x2  . TOTAL ABDOMINAL HYSTERECTOMY    . VIDEO BRONCHOSCOPY Bilateral 05/31/2017   Procedure: VIDEO BRONCHOSCOPY WITHOUT FLUORO;  Surgeon: Leslye Peer, MD;  Location: [facility];  Service: Cardiopulmonary;  Laterality: Bilateral;    Family History  Problem Relation Age of Onset  . Irritable bowel syndrome Mother   . Hypertension Mother   . Atrial fibrillation Mother   . Ovarian cancer Maternal Aunt   . Diabetes Father   . Heart attack Father   . Hypertension Father   . Hyperlipidemia Father   . Thyroid disease Sister   . Hyperlipidemia Sister   . Thyroid disease Brother   . Colon polyps Brother   . Stomach cancer Neg Hx   . Colon cancer Neg Hx     Social History   Tobacco Use  . Smoking status: Current Every Day  Smoker    Packs/day: 0.25    Types: Cigars, Cigarettes  . Smokeless tobacco: Never Used  . Tobacco comment: pt states she smokes 3 cigarettes/ day. Trying to quit. Taking Chantix  Substance Use Topics  . Alcohol use: Yes    Alcohol/week: 1.0 standard drink    Types: 1 Shots of liquor per week    Comment: occ    Subjective:  Patient complaining of sensation of feeling "dizzy/ swimmy" x 2 weeks; does not feel like it is vertigo;  Scheduled to see cardiologist next Monday- had to miss recent appointment because she felt bad; is under care of liver specialist as well- unsure of next appointment;  Notes her appetite has been down- drinking "a lot of Boost" and eating ice cream; has been checking her blood pressure medication and notes that numbers have been elevated recently; has actually taking blood pressure medication in the past- took Losartan and Lisinopril but stopped- unsure why/ thinks it could have been because she was able to lose weight;  Not currently taking her Allegra/ is using her Flonase;     Objective:  Vitals:   09/19/19 0954  BP: (!) 148/84  Pulse: 88  Temp: 98.5 F (36.9 C)  TempSrc: Oral  SpO2: 96%  Weight: (!) 207 lb 12.8 oz (94.3 kg)    General: Well developed, well nourished, in no acute distress  Skin : Warm and dry.  Head: Normocephalic and atraumatic  Eyes: Sclera and conjunctiva clear; pupils round and reactive to light; extraocular movements intact  Ears: External normal; canals clear; tympanic membranes congested bilaterally Oropharynx: Pink, supple. No suspicious lesions  Neck: Supple without thyromegaly, adenopathy  Lungs: Respirations unlabored; clear to auscultation bilaterally without wheeze, rales, rhonchi  CVS exam: normal rate and regular rhythm.  Musculoskeletal: No deformities; no active joint inflammation  Extremities: No edema, cyanosis, clubbing  Vessels: Symmetric bilaterally  Neurologic: Alert and oriented; speech intact; face  symmetrical; moves all extremities well; CNII-XII intact without focal deficit   Assessment:  1. Dizziness   2. Other fatigue   3. Elevated glucose   4. Iron overload     Plan:  Suspect combination of uncontrolled allergies and uncontrolled blood pressure; unremarkable carotid doppler done in 03/2018; She is to re-start her Allegra and continue Flonase; Keep planned appointment with  cardiology;  Re-start Losartan 50 mg which she has tolerated in the past; discussed need to work on weight loss/ limiting sugar intake and processed fats; Will update labs today as well; follow up in 2 weeks, sooner prn.  Return in about 2 weeks (around 10/03/2019).  Orders Placed This Encounter  Procedures  . CBC with Differential/Platelet    Standing Status:   Future    Number of Occurrences:   1    Standing Expiration Date:   09/18/2020  . Comp Met (CMET)    Standing Status:   Future    Number of Occurrences:   1    Standing Expiration Date:   09/18/2020  . TSH    Standing Status:   Future    Number of Occurrences:   1    Standing Expiration Date:   09/18/2020  . Hemoglobin A1c    Standing Status:   Future    Number of Occurrences:   1    Standing Expiration Date:   09/18/2020  . B12    Standing Status:   Future    Number of Occurrences:   1    Standing Expiration Date:   09/18/2020  . Ferritin    Standing Status:   Future    Number of Occurrences:   1    Standing Expiration Date:   09/18/2020    Requested Prescriptions   Signed Prescriptions Disp Refills  . losartan (COZAAR) 50 MG tablet 90 tablet 1    Sig: Take 1 tablet (50 mg total) by mouth daily.

## 2019-09-20 LAB — COMPREHENSIVE METABOLIC PANEL
AG Ratio: 1.3 (calc) (ref 1.0–2.5)
ALT: 41 U/L — ABNORMAL HIGH (ref 6–29)
AST: 84 U/L — ABNORMAL HIGH (ref 10–35)
Albumin: 4.2 g/dL (ref 3.6–5.1)
Alkaline phosphatase (APISO): 108 U/L (ref 37–153)
BUN: 14 mg/dL (ref 7–25)
CO2: 28 mmol/L (ref 20–32)
Calcium: 9.6 mg/dL (ref 8.6–10.4)
Chloride: 97 mmol/L — ABNORMAL LOW (ref 98–110)
Creat: 0.83 mg/dL (ref 0.50–1.05)
Globulin: 3.3 g/dL (calc) (ref 1.9–3.7)
Glucose, Bld: 138 mg/dL — ABNORMAL HIGH (ref 65–99)
Potassium: 4.9 mmol/L (ref 3.5–5.3)
Sodium: 134 mmol/L — ABNORMAL LOW (ref 135–146)
Total Bilirubin: 1.2 mg/dL (ref 0.2–1.2)
Total Protein: 7.5 g/dL (ref 6.1–8.1)

## 2019-09-20 LAB — CBC WITH DIFFERENTIAL/PLATELET
Absolute Monocytes: 777 cells/uL (ref 200–950)
Basophils Absolute: 67 cells/uL (ref 0–200)
Basophils Relative: 0.9 %
Eosinophils Absolute: 104 cells/uL (ref 15–500)
Eosinophils Relative: 1.4 %
HCT: 34.9 % — ABNORMAL LOW (ref 35.0–45.0)
Hemoglobin: 11.6 g/dL — ABNORMAL LOW (ref 11.7–15.5)
Lymphs Abs: 1584 cells/uL (ref 850–3900)
MCH: 33.9 pg — ABNORMAL HIGH (ref 27.0–33.0)
MCHC: 33.2 g/dL (ref 32.0–36.0)
MCV: 102 fL — ABNORMAL HIGH (ref 80.0–100.0)
MPV: 12.3 fL (ref 7.5–12.5)
Monocytes Relative: 10.5 %
Neutro Abs: 4869 cells/uL (ref 1500–7800)
Neutrophils Relative %: 65.8 %
Platelets: 218 10*3/uL (ref 140–400)
RBC: 3.42 10*6/uL — ABNORMAL LOW (ref 3.80–5.10)
RDW: 14.2 % (ref 11.0–15.0)
Total Lymphocyte: 21.4 %
WBC: 7.4 10*3/uL (ref 3.8–10.8)

## 2019-09-20 LAB — TSH: TSH: 2.94 mIU/L (ref 0.40–4.50)

## 2019-09-20 LAB — FERRITIN: Ferritin: 115 ng/mL (ref 16–232)

## 2019-09-20 LAB — HEMOGLOBIN A1C
Hgb A1c MFr Bld: 5.2 % of total Hgb (ref <5.7)
Mean Plasma Glucose: 103 (calc)
eAG (mmol/L): 5.7 (calc)

## 2019-09-20 LAB — VITAMIN B12: Vitamin B-12: 1020 pg/mL (ref 200–1100)

## 2019-09-21 ENCOUNTER — Encounter: Payer: Self-pay | Admitting: Family

## 2019-09-22 ENCOUNTER — Encounter: Payer: Self-pay | Admitting: Family

## 2019-09-24 ENCOUNTER — Telehealth: Payer: Self-pay | Admitting: Emergency Medicine

## 2019-09-24 NOTE — Telephone Encounter (Signed)
Patient is returning phone call. Patient phone number is [phone]. 

## 2019-09-24 NOTE — Telephone Encounter (Signed)
Returned call, had to leave vm.

## 2019-09-24 NOTE — Telephone Encounter (Signed)
If I write her for hycodan, then she will have to discuss with her Pain management MD's and it may impact their ability to give her Norco going forward.  If she understands this then I am willing to give her a one-time short-term prescription for Hycodan.  She needs to understand the potential impact on future medication before I write that prescription.  Please let me know how she wants to proceed

## 2019-09-24 NOTE — Telephone Encounter (Signed)
Patient calling stating she has run out of Narco and her cough is worse. She is requesting Hycodan. Patient says she will be out of Narco until she sees her doctor on Wednesday. Please advise.

## 2019-09-24 NOTE — Telephone Encounter (Signed)
Called and spoke with Patient.  Dr. Agustina Caroli recommendations given.  Understanding stated. Patient stated she would discuss this with pain management, 09/26/19.   Patient has made a follow up OV with Dr. [name] 10/02/19. Patient stated she would call [place] afternoon and give update from pain management appointment.    Message routed to Dr [name]

## 2019-09-24 NOTE — Telephone Encounter (Signed)
Thank you :)

## 2019-09-25 ENCOUNTER — Ambulatory Visit
Admission: RE | Admit: 2019-09-25 | Discharge: 2019-09-25 | Disposition: A | Discharge status: Home / Self Care | Payer: PPO | Source: Ambulatory Visit | Attending: Nurse Practitioner | Admitting: Nurse Practitioner

## 2019-09-25 DIAGNOSIS — K7469 Other cirrhosis of liver: Secondary | ICD-10-CM

## 2019-09-25 DIAGNOSIS — K7460 Unspecified cirrhosis of liver: Secondary | ICD-10-CM | POA: Diagnosis not present

## 2019-09-26 ENCOUNTER — Other Ambulatory Visit: Payer: PPO

## 2019-09-26 DIAGNOSIS — M25519 Pain in unspecified shoulder: Secondary | ICD-10-CM | POA: Diagnosis not present

## 2019-09-26 DIAGNOSIS — M9931 Osseous stenosis of neural canal of cervical region: Secondary | ICD-10-CM | POA: Diagnosis not present

## 2019-09-26 DIAGNOSIS — F419 Anxiety disorder, unspecified: Secondary | ICD-10-CM | POA: Diagnosis not present

## 2019-09-26 DIAGNOSIS — M47897 Other spondylosis, lumbosacral region: Secondary | ICD-10-CM | POA: Diagnosis not present

## 2019-09-26 DIAGNOSIS — M79609 Pain in unspecified limb: Secondary | ICD-10-CM | POA: Diagnosis not present

## 2019-09-26 DIAGNOSIS — M25559 Pain in unspecified hip: Secondary | ICD-10-CM | POA: Diagnosis not present

## 2019-09-26 DIAGNOSIS — G8929 Other chronic pain: Secondary | ICD-10-CM | POA: Diagnosis not present

## 2019-09-26 DIAGNOSIS — G894 Chronic pain syndrome: Secondary | ICD-10-CM | POA: Diagnosis not present

## 2019-09-26 DIAGNOSIS — M792 Neuralgia and neuritis, unspecified: Secondary | ICD-10-CM | POA: Diagnosis not present

## 2019-09-26 DIAGNOSIS — M48062 Spinal stenosis, lumbar region with neurogenic claudication: Secondary | ICD-10-CM | POA: Diagnosis not present

## 2019-09-27 ENCOUNTER — Ambulatory Visit: Payer: PPO | Admitting: Podiatry

## 2019-10-02 ENCOUNTER — Telehealth: Payer: Self-pay | Admitting: Family

## 2019-10-02 ENCOUNTER — Ambulatory Visit (INDEPENDENT_AMBULATORY_CARE_PROVIDER_SITE_OTHER): Payer: PPO | Admitting: Emergency Medicine

## 2019-10-02 ENCOUNTER — Other Ambulatory Visit: Payer: Self-pay

## 2019-10-02 ENCOUNTER — Encounter: Payer: Self-pay | Admitting: Emergency Medicine

## 2019-10-02 DIAGNOSIS — K449 Diaphragmatic hernia without obstruction or gangrene: Secondary | ICD-10-CM | POA: Diagnosis not present

## 2019-10-02 DIAGNOSIS — R059 Cough, unspecified: Secondary | ICD-10-CM

## 2019-10-02 DIAGNOSIS — J449 Chronic obstructive pulmonary disease, unspecified: Secondary | ICD-10-CM

## 2019-10-02 DIAGNOSIS — R911 Solitary pulmonary nodule: Secondary | ICD-10-CM

## 2019-10-02 DIAGNOSIS — F17200 Nicotine dependence, unspecified, uncomplicated: Secondary | ICD-10-CM | POA: Diagnosis not present

## 2019-10-02 DIAGNOSIS — R05 Cough: Secondary | ICD-10-CM

## 2019-10-02 DIAGNOSIS — J301 Allergic rhinitis due to pollen: Secondary | ICD-10-CM

## 2019-10-02 NOTE — Assessment & Plan Note (Signed)
With contributions from her GERD, hiatal hernia, allergic rhinitis.  She may benefit some from an allergy work-up, immunotherapy although not clear to me that this would resolve her cough altogether.  She has benefited from the [facility] as mentioned.  I held off on giving her Hycodan because I did not want to interrupt her relationship and schedule with Dr. Primus Bravo.  Continue to treat her GERD, allergies, continue to try to cut down on cigarettes.

## 2019-10-02 NOTE — Assessment & Plan Note (Signed)
Continue Dexilant as ordered

## 2019-10-02 NOTE — Assessment & Plan Note (Signed)
Continue, Allegra, Singulair Mucinex, Flonase

## 2019-10-02 NOTE — Telephone Encounter (Signed)
Faxed today

## 2019-10-02 NOTE — Progress Notes (Signed)
Subjective:    Patient ID: [name] [name], female    DOB: [birth year], 59 y.o.   MRN: [id number]  Cough Associated symptoms include rhinorrhea and shortness of breath. Pertinent negatives include no ear pain, eye redness, fever, headaches, postnasal drip, rash, sore throat or wheezing.                ROV 06/23/17 --59 year old woman who follows up today for her history of COPD, continued tobacco use, severe chronic cough that is been debilitating.  She has frequent GERD and nausea that are contributors, allergic rhinitis it is a contributor.  She has a right upper lobe pulmonary nodule that we followed with serial CT scans, stable on 02/17/2017.  We performed bronchoscopy 05/31/2017 because she had continued cough and also some episodic hemoptysis.  Her airway evaluation was normal.  AFB, fungal, cell count, cytology all normal.  She continues to have persistent cough. She is using her nasal steroid and nasal washes. She ran out of benadryl, has used some loratadine. She is awakened at night with cough. Had her dexilant increased to 60mg . She doesn';t feel any burning.  She no longer has her norco/ vicodin until next week. She is only smoking a few cigarettes a week.   Acute OV 09/16/17 --patient has COPD, continued tobacco use, severe chronic cough in the setting of GERD, allergic rhinitis.  Bronchoscopy has shown a normal airway evaluation.  Cultures reassuring.  She has a right upper lobe nodule 1.8 x 1.4 cm that is stable since 11/10/2016 on serial CTs most recently 07/25/2017.  She is here today reporting that her cough has increased - more nasal congestion. She has been quite upset and depressed due to the death of her mother last month. She is due to see Allergy in August. She is on flonase, dexilant. She tried [place].   ROV 10/02/19 -- Follow-up visit for 59 year old active smoker (20 pack years, smoking 5-8 a day) with mild COPD, chronic cough due to this as well as GERD, allergic rhinitis.  We had also  been following a 1.8 x 1.4 cm pulmonary nodule.  Most recent CT chest 04/04/2019 which I reviewed, stable.  She has had continued cough and has been on Dexilant, Flonase, Mucinex.  Last time Singulair and flutter valve were added. She also started allegra.  She is not on any scheduled bronchodilator regimen, never needs any albuterol.  Her last prednisone was over a year ago. Still coughs at night the most. She is on Norco for both pain and cough - seems to help her, but she was doing better when she was on 6 Norco a day.                                                                                                                                               Review of Systems  Constitutional:  Negative for fever and unexpected weight change.  HENT: Positive for rhinorrhea. Negative for congestion, dental problem, ear pain, nosebleeds, postnasal drip, sinus pressure, sneezing, sore throat and trouble swallowing.   Eyes: Negative for redness and itching.  Respiratory: Positive for cough and shortness of breath. Negative for chest tightness and wheezing.   Cardiovascular: Negative for palpitations and leg swelling.  Gastrointestinal: Negative for nausea and vomiting.  Genitourinary: Negative for dysuria.  Musculoskeletal: Negative for joint swelling.  Skin: Negative for rash.  Neurological: Negative for headaches.  Hematological: Does not bruise/bleed easily.  Psychiatric/Behavioral: Negative for dysphoric mood. The patient is not nervous/anxious.         Objective:   Physical Exam  Vitals:   10/02/19 1544  BP: 122/74  Pulse: 86  Temp: 97.9 F (36.6 C)  TempSrc: Temporal  SpO2: 97%  Weight: 210 lb 3.2 oz (95.3 kg)  Height: 5\' 5"  (1.651 m)   Gen: pleasant, overwt woman, in no distress,  normal affect  ENT: No lesions,  mouth clear,  oropharynx clear, no postnasal drip  Neck: No JVD, no stridor  Lungs: No use of accessory muscles, clear without rales or rhonchi  Cardiovascular:  RRR, heart sounds normal, no murmur or gallops, no peripheral edema  Musculoskeletal: No deformities, no cyanosis or clubbing  Neuro: alert, non focal  Skin: Warm, no lesions or rashes     Assessment & Plan:  COPD (chronic obstructive pulmonary disease) (HCC) Mild based on pulmonary function testing from 2018.  She never needs albuterol.  It does probably contribute some to her chronic cough as does her smoking.  She does not show any signs of hypoventilation or respiratory failure.  She is wondering if it is safe from a pulmonary standpoint to get back to her previous Norco dosing both for her pain and also for cough suppression.  She was using 6 a day.  Given how well she tolerated it before and given her pulmonary function testing, functional capacity, I believe that this would be acceptable and would not cause undue risk.   Allergic rhinitis Continue, Allegra, Singulair Mucinex, Flonase  Diaphragmatic hernia Continue Dexilant as ordered  Cough With contributions from her GERD, hiatal hernia, allergic rhinitis.  She may benefit some from an allergy work-up, immunotherapy although not clear to me that this would resolve her cough altogether.  She has benefited from the [facility] as mentioned.  I held off on giving her Hycodan because I did not want to interrupt her relationship and schedule with Dr. Primus Bravo.  Continue to treat her GERD, allergies, continue to try to cut down on cigarettes.  Current every day smoker Discussed cessation. We will refer her for LDCT lung cancer screening   Pulmonary nodule, right Stable and calcified. Should not needs any dedicated follow up./   Baltazar Apo, MD, PhD 10/02/2019, 4:13 PM [facility] [phone] or if no answer [phone]

## 2019-10-02 NOTE — Assessment & Plan Note (Signed)
Stable and calcified. Should not needs any dedicated follow up./

## 2019-10-02 NOTE — Assessment & Plan Note (Signed)
Discussed cessation. We will refer her for LDCT lung cancer screening

## 2019-10-02 NOTE — Telephone Encounter (Signed)
New Message:   Pt is calling and would like to know if we can fax or send Dr. Primus Bravo at [facility] her MRI results of her neck to [phone]. She states they had them before and now they can't find them. Please advise.

## 2019-10-02 NOTE — Patient Instructions (Signed)
Please continue your medications as you have been taking them We will refer you to the lung cancer screening program Try to cut down your cigarettes as much as possible. It should be okay from a pulmonary standpoint for you to go back to your usual Norco dosing for cough suppression as long as this is approved by Dr. Primus Bravo. Follow with Dr [name] in 6 months or sooner if you have any problems

## 2019-10-02 NOTE — Assessment & Plan Note (Signed)
Mild based on pulmonary function testing from 2018.  She never needs albuterol.  It does probably contribute some to her chronic cough as does her smoking.  She does not show any signs of hypoventilation or respiratory failure.  She is wondering if it is safe from a pulmonary standpoint to get back to her previous Norco dosing both for her pain and also for cough suppression.  She was using 6 a day.  Given how well she tolerated it before and given her pulmonary function testing, functional capacity, I believe that this would be acceptable and would not cause undue risk.

## 2019-10-03 ENCOUNTER — Ambulatory Visit (INDEPENDENT_AMBULATORY_CARE_PROVIDER_SITE_OTHER): Payer: PPO | Admitting: Family

## 2019-10-03 VITALS — BP 128/78 | HR 86 | Temp 98.2°F | Ht 65.0 in | Wt 209.0 lb

## 2019-10-03 DIAGNOSIS — I10 Essential (primary) hypertension: Secondary | ICD-10-CM

## 2019-10-03 DIAGNOSIS — R748 Abnormal levels of other serum enzymes: Secondary | ICD-10-CM

## 2019-10-03 DIAGNOSIS — E782 Mixed hyperlipidemia: Secondary | ICD-10-CM

## 2019-10-03 DIAGNOSIS — F1010 Alcohol abuse, uncomplicated: Secondary | ICD-10-CM | POA: Diagnosis not present

## 2019-10-03 MED ORDER — LOSARTAN POTASSIUM 50 MG PO TABS: 50.0000 mg | ORAL_TABLET | Freq: Every day | ORAL | 1 refills | Status: DC

## 2019-10-03 NOTE — Progress Notes (Signed)
[name] [name] is a 59 y.o. female with the following history as recorded in EpicCare:  Patient Active Problem List   Diagnosis Date Noted  . Pain in left knee 06/12/2019  . COPD (chronic obstructive pulmonary disease) ([place]) 11/08/2017  . Iron overload 09/22/2017  . Secondary hemochromatosis 09/22/2017  . Allergic rhinitis 09/05/2017  . Hemoptysis 05/26/2017  . Pulmonary nodule, right 11/29/2016  . Polyarthralgia 12/02/2015  . Varicose veins of both lower extremities 12/02/2015  . Hypertensive retinopathy of both eyes 11/24/2015  . Elevated liver enzymes 10/31/2015  . Hyperglycemia 10/31/2015  . SIRS (systemic inflammatory response syndrome) ([place]) 03/11/2013  . Hypertension   . Hyperlipidemia   . Anemia   . Hematochezia 08/14/2012  . ETOH abuse 08/14/2012  . Pseudocyst of pancreas 08/12/2012  . Acute pancreatitis 08/12/2012  . GERD (gastroesophageal reflux disease) 06/08/2012  . Rectal fissure 05/11/2011  . Rectal fistula 05/11/2011  . Hiatal hernia 04/28/2011  . Gastritis 04/28/2011  . Nausea 04/27/2011  . Acute epigastric pain 04/27/2011  . Obesity, Class III, BMI 40-49.9 (morbid obesity) ([place]) 04/27/2011  . Solitary rectal ulcer 04/12/2011  . Hemorrhoids 03/10/2011  . Ulcerative proctitis, nonspecific ([place]) 03/10/2011  . DIVERTICULOSIS, COLON 10/22/2009  . COLONIC POLYPS, HYPERPLASTIC, HX OF 10/22/2009  . INFECTIOUS DIARRHEA 09/27/2008  . ULCERATIVE PROCTITIS 09/27/2008  . NAUSEA AND VOMITING 09/27/2008  . NAUSEA ALONE 09/27/2008  . DIARRHEA 09/27/2008  . ABDOMINAL PAIN -GENERALIZED 09/27/2008  . DEPRESSION 09/12/2007  . HEMORRHOIDS 09/12/2007  . Diaphragmatic hernia 09/12/2007  . ULCERATIVE COLITIS 09/12/2007  . RECTAL PAIN 09/12/2007  . BIPOLAR AFFECTIVE DISORDER 12/06/2006  . Current every day smoker 12/06/2006  . Esophageal reflux 12/06/2006  . Cough 12/06/2006    Current Outpatient Medications  Medication Sig Dispense Refill  . ARIPiprazole (ABILIFY) 30  MG tablet     . atorvastatin (LIPITOR) 40 MG tablet TAKE ONE TABLET BY MOUTH DAILY AT 6PM AS DIRECTED 90 tablet 0  . dexlansoprazole (DEXILANT) 60 MG capsule Take 1 capsule (60 mg total) by mouth daily. Office visit for further refills 30 capsule 2  . diclofenac sodium (VOLTAREN) 1 % GEL Apply 2 g topically 4 (four) times daily.     . fexofenadine (ALLEGRA) 180 MG tablet Take 1 tablet (180 mg total) by mouth daily. 30 tablet 11  . FLUoxetine (PROZAC) 20 MG capsule Take 20 mg by mouth 2 (two) times daily.     . fluticasone (FLONASE) 50 MCG/ACT nasal spray SPRAY TWO SPRAYS IN EACH NOSTRIL ONCE DAILY 48 g 2  . HYDROcodone-acetaminophen (NORCO/VICODIN) 5-325 MG tablet Take 1 tablet by mouth 4 (four) times daily as needed for moderate pain.     Marland Kitchen lamoTRIgine (LAMICTAL) 100 MG tablet Take 100 mg by mouth 2 (two) times daily.     Marland Kitchen losartan (COZAAR) 50 MG tablet Take 1 tablet (50 mg total) by mouth daily. 90 tablet 1  . methocarbamol (ROBAXIN) 500 MG tablet Take 500 mg by mouth 2 (two) times daily as needed for muscle spasms.     . montelukast (SINGULAIR) 10 MG tablet Take 1 tablet (10 mg total) by mouth at bedtime. 30 tablet 3  . Multiple Vitamin (MULTIVITAMIN WITH MINERALS) TABS tablet Take 1 tablet by mouth daily. 30 tablet 0  . NARCAN 4 MG/0.1ML LIQD nasal spray kit Place 0.4 mg into the nose once.     . promethazine (PHENERGAN) 25 MG suppository INSERT ONE SUPPOSITORY RECTALLY EVERY 6 HOURS AS NEEDED FOR NAUSEA AND VOMITING 30 suppository 1  .  Respiratory Therapy Supplies (FLUTTER) DEVI 3 puffs by Does not apply route 4 (four) times daily as needed. Needs to use device 20 min 3-4 times a day for coughing 1 each 0  . traMADol (ULTRAM) 50 MG tablet     . benzonatate (TESSALON) 200 MG capsule TAKE ONE CAPSULE BY MOUTH EVERY 6 HOURS AS NEEDED FOR COUGH (Patient not taking: Reported on 10/02/2019) 20 capsule 0   No current facility-administered medications for this visit.    Allergies: Patient has no  known allergies.  Past Medical History:  Diagnosis Date  . ABDOMINAL PAIN -GENERALIZED 09/27/2008   Qualifier: Diagnosis of  By: Trellis Paganini PA-c, Amy S   . Acute epigastric pain 04/27/2011  . Acute pancreatitis 08/12/2012  . Allergic rhinitis 09/05/2017  . Anal fistula   . Anemia   . Anxiety   . Arthritis   . BIPOLAR AFFECTIVE DISORDER 12/06/2006   Qualifier: History of  By: Elsworth Soho MD, Leanna Sato Bipolar disorder ([place])   . Chronic kidney disease   . COPD (chronic obstructive pulmonary disease) ([place]) 11/08/2017  . Cough 12/06/2006   Annotation: chronic since'96, nml spirometry, +ve methacholine challenge, nml  CT sinuses, inconclusive pH probe, esophageal manometry Centricity Description: SYMPTOM, COUGH Qualifier: History of  By: Elsworth Soho MD, Leanna Sato   Centricity Description: COUGH Qualifier: Diagnosis of  By: Elsworth Soho MD, Leanna Sato    . Depressive disorder, not elsewhere classified   . Diarrhea 09/27/2008   Qualifier: Diagnosis of  By: Trellis Paganini PA-c, Amy S   . Diverticulosis of colon (without mention of hemorrhage)   . Elevated liver enzymes 10/31/2015  . Esophageal reflux   . ETOH abuse 08/14/2012  . Fatty liver   . Gastritis 04/28/2011  . GERD (gastroesophageal reflux disease)   . Hematochezia 08/14/2012  . Hemoptysis 05/26/2017  . HEMORRHOIDS 09/12/2007   Qualifier: Diagnosis of  By: Nils Pyle CMA ([place]), Mearl Latin    . Hepatic steatosis   . Hiatal hernia   . HIATAL HERNIA 09/12/2007   Qualifier: Diagnosis of  By: Nils Pyle CMA ([place]), Mearl Latin    . History of recurrent UTIs   . Hyperlipidemia   . Hypertension   . Hypertensive retinopathy of both eyes 11/24/2015  . INFECTIOUS DIARRHEA 09/27/2008   Qualifier: Diagnosis of  By: Laney Potash, Pam    . Iron deficiency   . Iron overload 09/22/2017  . Nausea alone 09/27/2008   Qualifier: Diagnosis of  By: Laney Potash, [place] 09/27/2008   Qualifier: Diagnosis of  By: Shane Crutch, Amy S   . Obesity   . Obesity, Class III, BMI 40-49.9  (morbid obesity) ([place]) 04/27/2011  . Pancreatitis   . Pancreatitis   . Personal history of colonic polyps 10/16/2009   hyperplastic  . Polyarthralgia 12/02/2015   Evaluated by Dr. Charlestine Night (rheumatologist) 12/29/2015:1): 1)possible fibromyalgia (Suggest physical therapy and flexeril), 2) Polyarthralgia/Insomnia (suggest use of robaxin or flexeril), 3) Nicotine Dependence (counseled to quit). 4)Depression (continue therapy with psychiatrist)  . Pre-diabetes   . Pseudocyst of pancreas 08/12/2012  . Pulmonary nodule, right 11/29/2016  . Rectal fissure 05/11/2011  . Rectal fistula 05/11/2011  . RECTAL PAIN 09/12/2007   Qualifier: Diagnosis of  By: Nils Pyle CMA ([place]), Mearl Latin    . Secondary hemochromatosis 09/22/2017  . Seizures ([place])   . SIRS (systemic inflammatory response syndrome) ([place]) 03/11/2013  . Solitary rectal ulcer 04/12/2011  . TOBACCO ABUSE 12/06/2006   Qualifier: History of  By: Elsworth Soho MD,  Leanna Sato Ulcerative colitis, unspecified   . Ulcerative proctitis, nonspecific ([place]) 03/10/2011  . Varicose veins of both lower extremities 12/02/2015    Past Surgical History:  Procedure Laterality Date  . FINGER AMPUTATION  2010   right index  . HEMORROIDECTOMY    . KNEE ARTHROSCOPY     bilateral, left x2  . TOTAL ABDOMINAL HYSTERECTOMY    . VIDEO BRONCHOSCOPY Bilateral 05/31/2017   Procedure: VIDEO BRONCHOSCOPY WITHOUT FLUORO;  Surgeon: Collene Gobble, MD;  Location: [facility];  Service: Cardiopulmonary;  Laterality: Bilateral;    Family History  Problem Relation Age of Onset  . Irritable bowel syndrome Mother   . Hypertension Mother   . Atrial fibrillation Mother   . Ovarian cancer Maternal Aunt   . Diabetes Father   . Heart attack Father   . Hypertension Father   . Hyperlipidemia Father   . Thyroid disease Sister   . Hyperlipidemia Sister   . Thyroid disease Brother   . Colon polyps Brother   . Stomach cancer Neg Hx   . Colon cancer Neg Hx     Social History   Tobacco Use   . Smoking status: Former Smoker    Packs/day: 0.25    Years: 46.00    Pack years: 11.50    Types: Cigars, Cigarettes    Quit date: 04/23/2019    Years since quitting: 0.4  . Smokeless tobacco: Never Used  . Tobacco comment: Pt states vaping every day   Substance Use Topics  . Alcohol use: Yes    Alcohol/week: 1.0 standard drink    Types: 1 Shots of liquor per week    Comment: occ    Subjective:   2 week follow-up on dizziness/ start of Losartan for elevated blood pressure; notes she has been feeling "much better" and no further dizziness; very excited about her improved energy; wants to work on continuing to take positive steps to take care of herself;  Objective:  Vitals:   10/03/19 0950  BP: 128/78  Pulse: 86  Temp: 98.2 F (36.8 C)  TempSrc: Oral  SpO2: 97%  Weight: 209 lb (94.8 kg)  Height: '5\' 5"'$  (1.651 m)    General: Well developed, well nourished, in no acute distress  Skin : Warm and dry.  Head: Normocephalic and atraumatic  Lungs: Respirations unlabored; clear to auscultation bilaterally without wheeze, rales, rhonchi  CVS exam: normal rate and regular rhythm.  Neurologic: Alert and oriented; speech intact; face symmetrical; moves all extremities well; CNII-XII intact without focal deficit   Assessment:  1. Essential hypertension   2. Mixed hyperlipidemia   3. ETOH abuse   4. Elevated liver enzymes     Plan:  1. Good improvement; stable on Losartan 50 mg daily; 2. Stressed need to take her Lipitor daily as discussed; 3. Encouraged to continue working on goal of sobriety; 4. Keep planned follow-up with liver specialist;  This visit occurred during the SARS-CoV-2 public health emergency.  Safety protocols were in place, including screening questions prior to the visit, additional usage of staff PPE, and extensive cleaning of exam room while observing appropriate contact time as indicated for disinfecting solutions.     Return in about 4 months (around  02/02/2020).  No orders of the defined types were placed in this encounter.   Requested Prescriptions   Signed Prescriptions Disp Refills  . losartan (COZAAR) 50 MG tablet 90 tablet 1    Sig: Take 1 tablet (50 mg total)  by mouth daily.

## 2019-10-05 ENCOUNTER — Other Ambulatory Visit: Payer: Self-pay | Admitting: Primary Care

## 2019-10-08 ENCOUNTER — Other Ambulatory Visit: Payer: Self-pay | Admitting: Family

## 2019-10-08 ENCOUNTER — Encounter: Payer: Self-pay | Admitting: Family

## 2019-10-08 DIAGNOSIS — R937 Abnormal findings on diagnostic imaging of other parts of musculoskeletal system: Secondary | ICD-10-CM

## 2019-10-12 DIAGNOSIS — F319 Bipolar disorder, unspecified: Secondary | ICD-10-CM | POA: Diagnosis not present

## 2019-10-17 ENCOUNTER — Telehealth: Payer: Self-pay | Admitting: Emergency Medicine

## 2019-10-17 NOTE — Telephone Encounter (Signed)
Called and spoke with patient she is wondering if Dr. [name] discussed with her pain mgmt regarding increasing Norco for cough or giving cough med   Dr. [name] please advise

## 2019-10-18 ENCOUNTER — Encounter: Payer: Self-pay | Admitting: *Deleted

## 2019-10-18 NOTE — Telephone Encounter (Signed)
Called and spoke with Patient. Dr Agustina Caroli recommendations given.  Understanding stated. Patient stated she has OV with Dr Primus Bravo next week, and will discuss with him.  Nothing further at this time.

## 2019-10-18 NOTE — Telephone Encounter (Signed)
As I've explained to her before, I'm not going to refill the hycodan without Dr Ethel Rana express knowledge and approval. If I do it will void her arrangement with him. I'm sorry that I can't help her immediately. That's why I wanted Dr Primus Bravo to increase her Norco back to prior dosing, so he'd be aware of all narcotics being prescribed.

## 2019-10-18 NOTE — Telephone Encounter (Signed)
I spoke with the pt and have faxed letter to Dr Mohammed Kindle  She is now asking for a refill on her hycodan  States that she has been having coughing spells- non prod and this is what helps  She has tessalon and it's not making any difference  She is not having any acute symptoms of fever, CP or SOB  Please advise, thanks

## 2019-10-18 NOTE — Telephone Encounter (Signed)
I haven't spoken with Pain Management - I thought that Ms Hamlett was going to review our discussions with them. I do believe that she would be able to tolerate an increase in her Norco back to prior dosing from a pulmonary standpoint. We can send a letter to Dr Primus Bravo to back up that statement if she would like.

## 2019-10-18 NOTE — Telephone Encounter (Signed)
Pt had called again today- called her back to let her know still waiting for response from [facility] and had to [facility]

## 2019-10-24 DIAGNOSIS — G894 Chronic pain syndrome: Secondary | ICD-10-CM | POA: Diagnosis not present

## 2019-10-24 DIAGNOSIS — M545 Low back pain: Secondary | ICD-10-CM | POA: Diagnosis not present

## 2019-10-24 DIAGNOSIS — M5136 Other intervertebral disc degeneration, lumbar region: Secondary | ICD-10-CM | POA: Diagnosis not present

## 2019-10-24 DIAGNOSIS — M7060 Trochanteric bursitis, unspecified hip: Secondary | ICD-10-CM | POA: Diagnosis not present

## 2019-10-25 ENCOUNTER — Ambulatory Visit: Payer: PPO | Admitting: Cardiology

## 2019-11-01 ENCOUNTER — Encounter: Payer: Self-pay | Admitting: Gastroenterology

## 2019-11-02 DIAGNOSIS — G5602 Carpal tunnel syndrome, left upper limb: Secondary | ICD-10-CM | POA: Diagnosis not present

## 2019-11-02 DIAGNOSIS — I10 Essential (primary) hypertension: Secondary | ICD-10-CM | POA: Diagnosis not present

## 2019-11-02 DIAGNOSIS — M47812 Spondylosis without myelopathy or radiculopathy, cervical region: Secondary | ICD-10-CM | POA: Diagnosis not present

## 2019-11-02 DIAGNOSIS — Z6836 Body mass index (BMI) 36.0-36.9, adult: Secondary | ICD-10-CM | POA: Diagnosis not present

## 2019-11-03 ENCOUNTER — Telehealth: Payer: Self-pay | Admitting: Podiatry

## 2019-11-03 ENCOUNTER — Encounter: Payer: Self-pay | Admitting: Podiatry

## 2019-11-03 NOTE — Telephone Encounter (Signed)
Received call today that she had redness and a scaling rash over the area of the fibroma that was injected in July.  She was unsure if it was an infection.  Educated her that this is likely not an infection this late.  Unclear if it is a dermatitis or tinea eruption.  Prescribed ketoconazole 2% empirically and advised her she should make an appointment to see Dr. Milinda Pointer soon in the next 2 weeks if it does not resolve  Lanae Crumbly, DPM 11/03/2019

## 2019-11-04 ENCOUNTER — Telehealth: Payer: Self-pay | Admitting: Podiatry

## 2019-11-04 MED ORDER — CEFDINIR 300 MG PO CAPS
300.0000 mg | ORAL_CAPSULE | Freq: Two times a day (BID) | ORAL | 0 refills | Status: AC
Start: 2019-11-04 — End: 2019-11-14

## 2019-11-04 NOTE — Telephone Encounter (Signed)
Received a 2nd call today and she is still concerned she has an infection. Ketoconazole has not changed symptoms. She asks for an antibiotic. I recommended she be evaluated at an UC but she is afraid of going to the ED or UC. I will send her omnicef and she will call to have a follow up appt scheduled with Dr Milinda Pointer

## 2019-11-05 ENCOUNTER — Other Ambulatory Visit: Payer: Self-pay | Admitting: Internal Medicine

## 2019-11-05 NOTE — Telephone Encounter (Signed)
Thanks Adam.  Most likely due to injection. You will see this when you use a strong steroid that extravasates into the skin from your deep injection.  I always tell my fibroma patients that this might happen but they seem to always forget.  I usually just have them use Aquaphor ointment or Vaseline.

## 2019-11-06 ENCOUNTER — Encounter: Payer: Self-pay | Admitting: Internal Medicine

## 2019-11-06 ENCOUNTER — Other Ambulatory Visit (INDEPENDENT_AMBULATORY_CARE_PROVIDER_SITE_OTHER): Payer: PPO

## 2019-11-06 ENCOUNTER — Ambulatory Visit (INDEPENDENT_AMBULATORY_CARE_PROVIDER_SITE_OTHER): Payer: PPO | Admitting: Internal Medicine

## 2019-11-06 VITALS — BP 114/70 | HR 88 | Ht 63.5 in | Wt 218.2 lb

## 2019-11-06 DIAGNOSIS — Z860100 Personal history of colon polyps, unspecified: Secondary | ICD-10-CM

## 2019-11-06 DIAGNOSIS — K7460 Unspecified cirrhosis of liver: Secondary | ICD-10-CM

## 2019-11-06 DIAGNOSIS — R945 Abnormal results of liver function studies: Secondary | ICD-10-CM | POA: Diagnosis not present

## 2019-11-06 DIAGNOSIS — R7989 Other specified abnormal findings of blood chemistry: Secondary | ICD-10-CM

## 2019-11-06 DIAGNOSIS — F1020 Alcohol dependence, uncomplicated: Secondary | ICD-10-CM

## 2019-11-06 DIAGNOSIS — R932 Abnormal findings on diagnostic imaging of liver and biliary tract: Secondary | ICD-10-CM

## 2019-11-06 DIAGNOSIS — R195 Other fecal abnormalities: Secondary | ICD-10-CM

## 2019-11-06 LAB — CBC WITH DIFFERENTIAL/PLATELET
Basophils Absolute: 0.1 10*3/uL (ref 0.0–0.1)
Basophils Relative: 1.3 % (ref 0.0–3.0)
Eosinophils Absolute: 0.1 10*3/uL (ref 0.0–0.7)
Eosinophils Relative: 2.4 % (ref 0.0–5.0)
HCT: 32.9 % — ABNORMAL LOW (ref 36.0–46.0)
Hemoglobin: 11.2 g/dL — ABNORMAL LOW (ref 12.0–15.0)
Lymphocytes Relative: 32 % (ref 12.0–46.0)
Lymphs Abs: 1.4 10*3/uL (ref 0.7–4.0)
MCHC: 34.2 g/dL (ref 30.0–36.0)
MCV: 103.3 fl — ABNORMAL HIGH (ref 78.0–100.0)
Monocytes Absolute: 0.5 10*3/uL (ref 0.1–1.0)
Monocytes Relative: 10.6 % (ref 3.0–12.0)
Neutro Abs: 2.3 10*3/uL (ref 1.4–7.7)
Neutrophils Relative %: 53.7 % (ref 43.0–77.0)
Platelets: 174 10*3/uL (ref 150.0–400.0)
RBC: 3.18 Mil/uL — ABNORMAL LOW (ref 3.87–5.11)
RDW: 14.4 % (ref 11.5–15.5)
WBC: 4.3 10*3/uL (ref 4.0–10.5)

## 2019-11-06 LAB — COMPREHENSIVE METABOLIC PANEL
ALT: 32 U/L (ref 0–35)
AST: 58 U/L — ABNORMAL HIGH (ref 0–37)
Albumin: 4.1 g/dL (ref 3.5–5.2)
Alkaline Phosphatase: 95 U/L (ref 39–117)
BUN: 9 mg/dL (ref 6–23)
CO2: 27 mEq/L (ref 19–32)
Calcium: 9.2 mg/dL (ref 8.4–10.5)
Chloride: 95 mEq/L — ABNORMAL LOW (ref 96–112)
Creatinine, Ser: 0.73 mg/dL (ref 0.40–1.20)
GFR: 81.66 mL/min (ref >60.00)
Glucose, Bld: 112 mg/dL — ABNORMAL HIGH (ref 70–99)
Potassium: 4.2 mEq/L (ref 3.5–5.1)
Sodium: 131 mEq/L — ABNORMAL LOW (ref 135–145)
Total Bilirubin: 0.7 mg/dL (ref 0.2–1.2)
Total Protein: 7.6 g/dL (ref 6.0–8.3)

## 2019-11-06 LAB — PROTIME-INR
INR: 1.1 ratio — ABNORMAL HIGH (ref 0.8–1.0)
Prothrombin Time: 11.8 s (ref 9.6–13.1)

## 2019-11-06 MED ORDER — SUTAB 1479-225-188 MG PO TABS: 1.0000 | ORAL_TABLET | Freq: Once | ORAL | 0 refills | Status: AC

## 2019-11-06 NOTE — Progress Notes (Signed)
HISTORY OF PRESENT ILLNESS:  [name] [name] is a 59 y.o. female with multiple significant medical problems as listed below.  She has a history of bipolar disorder, alcohol-related pancreatitis, morbid obesity, GERD, and chronic pain syndrome on narcotics.  Previous patient of [name] [name].  I last saw the patient October 07, 2018 4-day posterior anal fissure.  See that dictation for details.  Her problem is resolved.  She presents today with complaints of intermittent dark stools, abnormal liver test, abnormal abdominal imaging, minor rectal bleeding, and the need for surveillance colonoscopy.  First, she reports having had several days of dark stools a few months back.  This will happen occasionally.  Last such episode a few days ago.  More normal bowels today.  She does take Dexilant on a daily basis.  Review of blood work finds hemoglobin February 2000 2111.4.  Repeat hemoglobin September 19 2019.6.  She has chronic intermittent abdominal pain, which is unchanged.  Next, she has had abnormal liver tests with AST greater than ALT.  Labs from September 19, 2019 revealed MCV 102.  Platelet count 218,000.  AST 84.  ALT 41.  Normal alkaline phosphatase and bilirubin.  Hemoglobin A1c 5.2.  Her last upper endoscopy was performed January 2019.  Examination was normal.  She did undergo abdominal ultrasound September 25, 2019 regarding abnormal liver test.  She was found to have a nodular liver contour possibly representing underlying cirrhosis.  She has had some minor rectal bleeding recently.  Her last complete colonoscopy October 2018 revealed for adenomatous colon polyps and pandiverticulosis as well as internal and external hemorrhoids.  She is due for surveillance.  She does continue to drink alcohol.  She reports significant anxiety and situational stress at home.  She continues on hydrocodone and tramadol for her chronic pain syndrome.  She has completed her Covid vaccination series  REVIEW OF SYSTEMS:  All non-GI  ROS negative unless otherwise stated in the HPI except for arthritis, depression  Past Medical History:  Diagnosis Date  . ABDOMINAL PAIN -GENERALIZED 09/27/2008   Qualifier: Diagnosis of  By: [name] Paganini PA-c, [name] [name]   . Acute epigastric pain 04/27/2011  . Acute pancreatitis 08/12/2012  . Allergic rhinitis 09/05/2017  . Anal fistula   . Anemia   . Anxiety   . Arthritis   . BIPOLAR AFFECTIVE DISORDER 12/06/2006   Qualifier: History of  By: [name] [name] Bipolar disorder ([name] [name])   . Chronic kidney disease   . COPD (chronic obstructive pulmonary disease) ([place]) 11/08/2017  . Cough 12/06/2006   Annotation: chronic since'96, nml spirometry, +ve methacholine challenge, nml  CT sinuses, inconclusive pH probe, esophageal manometry Centricity Description: SYMPTOM, COUGH Qualifier: History of  By: [name] [name]   Centricity Description: COUGH Qualifier: Diagnosis of  By: [name] [name]    . Depressive disorder, not elsewhere classified   . Diarrhea 09/27/2008   Qualifier: Diagnosis of  By: [name] Paganini PA-c, [name] [name]   . Diverticulosis of colon (without mention of hemorrhage)   . Elevated liver enzymes 10/31/2015  . Esophageal reflux   . ETOH abuse 08/14/2012  . Fatty liver   . Gastritis 04/28/2011  . GERD (gastroesophageal reflux disease)   . Hematochezia 08/14/2012  . Hemoptysis 05/26/2017  . HEMORRHOIDS 09/12/2007   Qualifier: Diagnosis of  By: [name] [name] CMA ([place]), [name] [name]    . Hepatic steatosis   . Hiatal hernia   . HIATAL HERNIA 09/12/2007   Qualifier: Diagnosis of  By: [name] [name] CMA ([name] [name]), [name] [name]    . History of recurrent UTIs   . Hyperlipidemia   . Hypertension   . Hypertensive retinopathy of both eyes 11/24/2015  . INFECTIOUS DIARRHEA 09/27/2008   Qualifier: Diagnosis of  By: [name] [name], [name] [name]    . Iron deficiency   . Iron overload 09/22/2017  . Nausea alone 09/27/2008   Qualifier: Diagnosis of  By: [name] [name], [name] [name] 09/27/2008   Qualifier: Diagnosis of  By: [name] [name], [name] [name]   . Obesity   . Obesity, Class III, BMI 40-49.9 (morbid obesity) ([place]) 04/27/2011  . Pancreatitis   . Personal history of colonic polyps 10/16/2009   hyperplastic  . Polyarthralgia 12/02/2015   Evaluated by [name] [name] (rheumatologist) 12/29/2015:1): 1)possible fibromyalgia (Suggest physical therapy and flexeril), 2) Polyarthralgia/Insomnia (suggest use of robaxin or flexeril), 3) Nicotine Dependence (counseled to quit). 4)Depression (continue therapy with psychiatrist)  . Pre-diabetes   . Pseudocyst of pancreas 08/12/2012  . Pulmonary nodule, right 11/29/2016  . Rectal fissure 05/11/2011  . Rectal fistula 05/11/2011  . RECTAL PAIN 09/12/2007   Qualifier: Diagnosis of  By: [name] [name] CMA ([place]), [name] [name]    . Secondary hemochromatosis 09/22/2017  . Seizures ([place])   . SIRS (systemic inflammatory response syndrome) ([place]) 03/11/2013  . Solitary rectal ulcer 04/12/2011  . TOBACCO ABUSE 12/06/2006   Qualifier: History of  By: [name] [name].    . Ulcerative colitis, unspecified   . Ulcerative proctitis, nonspecific ([place]) 03/10/2011  . Varicose veins of both lower extremities 12/02/2015    Past Surgical History:  Procedure Laterality Date  . FINGER AMPUTATION  2010   right index  . HEMORROIDECTOMY    . KNEE ARTHROSCOPY     bilateral, left x2  . TOTAL ABDOMINAL HYSTERECTOMY    . VIDEO BRONCHOSCOPY Bilateral 05/31/2017   Procedure: VIDEO BRONCHOSCOPY WITHOUT FLUORO;  Surgeon: [name] Gobble, MD;  Location: [facility];  Service: Cardiopulmonary;  Laterality: Bilateral;    Social History [name] [name]  reports that she quit smoking about 6 months ago. Her smoking use included cigars and cigarettes. She has a 11.50 pack-year smoking history. She has never used smokeless tobacco. She reports current alcohol use of about 1.0 standard drink of alcohol per week. She reports that she does not use drugs.  family history includes Atrial fibrillation in her mother; Colon polyps in her brother;  Diabetes in her father; Heart attack in her father; Hyperlipidemia in her father and sister; Hypertension in her father and mother; Irritable bowel syndrome in her mother; Ovarian cancer in her maternal aunt; Thyroid disease in her brother and sister.  No Known Allergies     PHYSICAL EXAMINATION: Vital signs: BP 114/70 (BP Location: Left Arm, Patient Position: Sitting, Cuff Size: Normal)   Pulse 88   Ht 5' 3.5" (1.613 m)   Wt 218 lb 4 oz (99 kg)   BMI 38.05 kg/m   Constitutional: Obese, unhealthy appearing, no acute distress Psychiatric: alert and oriented x3, cooperative.  Pleasant Eyes: extraocular movements intact, anicteric, conjunctiva pink Mouth: oral pharynx moist, no lesions Neck: supple no lymphadenopathy Cardiovascular: heart regular rate and rhythm, no murmur Lungs: clear to auscultation bilaterally Abdomen: soft, obese nontender, nondistended, no obvious ascites, no peritoneal signs, normal bowel sounds, no organomegaly Rectal: Deferred until colonoscopy Extremities: no clubbing or cyanosis.  Trace lower extremity edema bilaterally Skin: no lesions on visible extremities Neuro: No focal deficits. No asterixis.    ASSESSMENT:  1.  Intermittent dark stools.  Stable hemoglobin.  Rule out upper GI mucosal lesion 2.  Chronic GERD.  On PPI 3.  Alcoholic liver disease. 4.  Elevated liver tests related to alcohol 5.  Morbid obesity 6.  History of multiple adenomatous colon polyps.  Due for surveillance 7.  History of anal fissure 8.  Recent minor rectal bleeding 9.  Multiple significant medical problems 10.  Abnormal ultrasound suggesting cirrhosis of the liver  PLAN:  1.  CBC, comprehensive metabolic panel, PT/INR today 2.  Schedule surveillance colonoscopy.  The patient is HIGH RISK given her body habitus and comorbidities.The nature of the procedure, as well as the risks, benefits, and alternatives were carefully and thoroughly reviewed with the patient. Ample time  for discussion and questions allowed. The patient understood, was satisfied, and agreed to proceed. 3.  Schedule upper endoscopy to evaluate dark stools and screen for varices.  Patient is high risk as above.The nature of the procedure, as well as the risks, benefits, and alternatives were carefully and thoroughly reviewed with the patient. Ample time for discussion and questions allowed. The patient understood, was satisfied, and agreed to proceed. 4.  Weight loss and exercise 5.  Stop drinking alcohol.  Advised  ADDENDUM: Laboratories have returned.  Hemoglobin stable at 11.2.  MCV elevated at 103.3.  Platelet count 174,000.  Sodium 131.  AST 58.  ALT 32.  Bilirubin 0.7.  Creatinine 0.73.  INR 1.1. Patient has been notified regarding her lab results

## 2019-11-06 NOTE — Patient Instructions (Signed)
Your provider has requested that you go to the basement level for lab work before leaving today. Press "B" on the elevator. The lab is located at the first door on the left as you exit the elevator.  You have been scheduled for an endoscopy and colonoscopy. Please follow the written instructions given to you at your visit today. Please pick up your prep supplies at the pharmacy within the next 1-3 days. If you use inhalers (even only as needed), please bring them with you on the day of your procedure.   

## 2019-11-08 ENCOUNTER — Encounter: Payer: Self-pay | Admitting: Podiatry

## 2019-11-08 ENCOUNTER — Ambulatory Visit (INDEPENDENT_AMBULATORY_CARE_PROVIDER_SITE_OTHER): Payer: PPO | Admitting: Podiatry

## 2019-11-08 ENCOUNTER — Other Ambulatory Visit: Payer: Self-pay

## 2019-11-08 DIAGNOSIS — M778 Other enthesopathies, not elsewhere classified: Secondary | ICD-10-CM

## 2019-11-08 DIAGNOSIS — M722 Plantar fascial fibromatosis: Secondary | ICD-10-CM

## 2019-11-08 NOTE — Progress Notes (Signed)
She presents today for follow-up of her plantar fibroma she states that time she got a rash on the mid arch that was painful from the injection but all in all she is doing much better.  States that she is about 50% better she refers to the second metatarsophalangeal joint capsulitis.  Objective: Vital signs are stable alert oriented x3 the quarter size lesion medial longitudinal arch demonstrates some thinning of the skin and some tearing with fissuring this is consistent with extravasation of the steroid into the skin from the fibroma.  At this point the fibroma has reduced to nearly the nonpalpable state.  She still has some tenderness on end range of motion secondary tarsal phalangeal joint.  Assessment: Well-healing plantar fibroma left foot healing capsulitis second bilateral.  Plan: At this point I injected 2 mg of dexamethasone local anesthetic into the second metatarsophalangeal joint plantarly bilaterally and I will follow-up with her on an as-needed basis.  Instructed her to keep Vaseline or some Neosporin on the wound on the plantar medial arch left and she will do so until completely healed.

## 2019-11-10 DIAGNOSIS — Z1231 Encounter for screening mammogram for malignant neoplasm of breast: Secondary | ICD-10-CM | POA: Diagnosis not present

## 2019-11-20 ENCOUNTER — Encounter: Payer: Self-pay | Admitting: Family

## 2019-11-21 DIAGNOSIS — M5136 Other intervertebral disc degeneration, lumbar region: Secondary | ICD-10-CM | POA: Diagnosis not present

## 2019-11-21 DIAGNOSIS — M179 Osteoarthritis of knee, unspecified: Secondary | ICD-10-CM | POA: Diagnosis not present

## 2019-11-21 DIAGNOSIS — G894 Chronic pain syndrome: Secondary | ICD-10-CM | POA: Diagnosis not present

## 2019-11-21 DIAGNOSIS — M545 Low back pain: Secondary | ICD-10-CM | POA: Diagnosis not present

## 2019-11-27 ENCOUNTER — Ambulatory Visit: Payer: PPO | Admitting: Cardiology

## 2019-11-27 ENCOUNTER — Other Ambulatory Visit: Payer: Self-pay | Admitting: Family

## 2019-11-27 DIAGNOSIS — E782 Mixed hyperlipidemia: Secondary | ICD-10-CM

## 2019-11-27 MED ORDER — ATORVASTATIN CALCIUM 40 MG PO TABS: ORAL_TABLET | ORAL | 3 refills | Status: DC

## 2019-11-28 ENCOUNTER — Telehealth: Payer: Self-pay | Admitting: Emergency Medicine

## 2019-11-28 MED ORDER — DOXYCYCLINE HYCLATE 100 MG PO TABS: 100.0000 mg | ORAL_TABLET | Freq: Two times a day (BID) | ORAL | 0 refills | Status: DC

## 2019-11-28 MED ORDER — DOXYCYCLINE HYCLATE 100 MG PO TABS
100.0000 mg | ORAL_TABLET | Freq: Two times a day (BID) | ORAL | 0 refills | Status: DC
Start: 2019-11-28 — End: 2019-12-18

## 2019-11-28 MED ORDER — PREDNISONE 10 MG PO TABS: ORAL_TABLET | ORAL | 0 refills | Status: DC

## 2019-11-28 NOTE — Telephone Encounter (Signed)
Spoke with Eric Form NP who stated the doxycycline and prednisone had not been sent to her pharmacy, [facility].  Previously sent at 3:10 pm, attempted to call pharmacy, line busy.  Sent scripts again.  Called pharmacy a second time, verified that the scripts had been received and the pharmacist was filling them.  Noting further needed.

## 2019-11-28 NOTE — Addendum Note (Signed)
Addended by: Vanessa Barbara on: 11/28/2019 05:35 PM   Modules accepted: Orders

## 2019-11-28 NOTE — Telephone Encounter (Signed)
Called and spoke with patient, advised of Dr. Agustina Caroli recommendations.  Confirmed patient's pharmacy, scripts sent.  Patient advised to let us know if she is not feeling better after she completes the antibiotic and prednisone.  She verbalized understanding.  Nothing further needed.

## 2019-11-28 NOTE — Telephone Encounter (Signed)
Spoke with the pt  She is c/o increased SOB x 3-4 days  She states she gets winded walking just across the room and even at rest sometimes  She is having a prod cough with white to yellow sputum  She states that she is wheezing at times  She denies any f/c/s, body aches  Has been covid vaccinated  She is taking singulair and allegra  Not on any inhalers at this time  Please advise, thanks!

## 2019-11-28 NOTE — Telephone Encounter (Signed)
ATC patient LMTCB X1 

## 2019-11-28 NOTE — Telephone Encounter (Signed)
Doxycycline 100mg  bid x 7 days  Pred >> Take 40mg  daily for 3 days, then 30mg  daily for 3 days, then 20mg  daily for 3 days, then 10mg  daily for 3 days, then stop

## 2019-11-30 ENCOUNTER — Telehealth: Payer: Self-pay | Admitting: Emergency Medicine

## 2019-11-30 ENCOUNTER — Ambulatory Visit: Payer: PPO | Admitting: Physical Therapy

## 2019-11-30 NOTE — Telephone Encounter (Signed)
Spoke with the pt  She states that she is calling b/c ever since starting the prednisone she has been feeling nervous and anxious  She states that it also makes her feel jittery and irritable  She is asking if there is something else that can be called in to take it's place  Please advise, thanks!

## 2019-12-02 ENCOUNTER — Telehealth: Payer: Self-pay | Admitting: Physician Assistant

## 2019-12-02 MED ORDER — PROMETHAZINE HCL 25 MG RE SUPP
RECTAL | 0 refills | Status: DC
Start: 2019-12-02 — End: 2019-12-05

## 2019-12-02 NOTE — Telephone Encounter (Signed)
12/02/19 Telephone call [phone]  Patient called on-call service and described that she had recently been started on antibiotic for a "lung infection", due to this she had been nauseous and now is unable to keep down her antibiotic over the past 24 hours due to the fact that she ran out of Phenergan suppositories.  She had been taking 1 of these in the morning to help her get in all of her medicines and then sometimes waking up with nausea in the night and taking another one.  Tells me she would like to have a refill in order to keep taking her other antibiotics.  Discussed that I would refill this for her with 10 suppositories.  She needs to call her prescribing physician for the antibiotic and tell them that she is nauseous so they can refill in the future if still necessary.  She verbalized understanding.  Ellouise Newer, PA-C

## 2019-12-03 NOTE — Telephone Encounter (Signed)
Lm for patient.  

## 2019-12-03 NOTE — Telephone Encounter (Signed)
Spoke with pt. She is aware of Dr. Agustina Caroli response. Pt does not want the Medrol Dose Pak prescription at this time.

## 2019-12-03 NOTE — Telephone Encounter (Signed)
Pt returning a phone call. Pt can be reached at [phone]

## 2019-12-03 NOTE — Telephone Encounter (Signed)
It's Ok for her to stop the prednisone.  Unfortunately all of the meds in this class have similar side effects, so there isn;t a good substitute. If she wants to tyry a medrol dose-pack then I am ok with it If she is having URI sx + nausea/vomiting, then it would bre reasonable for her to be covid tested.

## 2019-12-04 ENCOUNTER — Telehealth: Payer: Self-pay | Admitting: Internal Medicine

## 2019-12-04 ENCOUNTER — Telehealth: Payer: Self-pay | Admitting: Emergency Medicine

## 2019-12-04 ENCOUNTER — Ambulatory Visit: Payer: Self-pay

## 2019-12-04 ENCOUNTER — Other Ambulatory Visit: Payer: Self-pay | Admitting: Internal Medicine

## 2019-12-04 ENCOUNTER — Other Ambulatory Visit: Payer: PPO

## 2019-12-04 MED ORDER — ONDANSETRON HCL 4 MG PO TABS: 4.0000 mg | ORAL_TABLET | Freq: Four times a day (QID) | ORAL | 0 refills | Status: DC | PRN

## 2019-12-04 NOTE — Telephone Encounter (Signed)
Called and spoke with pt who stated she took an at home covid test and the results came back negative. Stated to her that we would discuss this with Dr. [name] to see what he has to say and she verbalized understanding.  Spoke with RB stating to him that pt took an at home covid test which results came back as negative. Dr. [name] said that pt does need to be seen in office.  Called and spoke with pt letting her know that [facility] said that she can keep appt as scheduled and she verbalized understanding. Nothing further needed.

## 2019-12-04 NOTE — Telephone Encounter (Signed)
Spoke with pt and let her know 10 supp were sent in on 10/10 and she was supposed to contact her Prescribing MD of the antibiotics so they can refill the phenergan. Pt aware.

## 2019-12-04 NOTE — Telephone Encounter (Signed)
Patient took an antibiotic and has started vomiting and would like to speak with nurse on recommendations. Please advise

## 2019-12-04 NOTE — Telephone Encounter (Signed)
Spoke with the pt  I made her aware of Dr Agustina Caroli response  She states that her friend is going to get her a home covid test from [facility] right now  I advised per RB she needs to be seen, but if her test comes back positive will need to cancel  Also- if result still pending will need to wait for neg result and this may mean she will possibly have to reschedule  Pt verbalized understanding  Rx for zofran sent

## 2019-12-04 NOTE — Telephone Encounter (Signed)
Primary Pulmonologist: Byrum Last office visit and with whom: 10/02/2019 Byrum What do we see them for (pulmonary problems):  COPD Last OV assessment/plan: Assessment & Plan Note by Collene Gobble, MD at 10/02/2019 4:09 PM Author: Collene Gobble, MD Author Type: Physician Filed: 10/02/2019 4:09 PM  Note Status: Written Cosign: Cosign Not Required Encounter Date: 10/02/2019  Problem: Diaphragmatic hernia  Editor: Collene Gobble, MD (Physician)                 Continue Dexilant as ordered    Assessment & Plan Note by Collene Gobble, MD at 10/02/2019 4:08 PM Author: Collene Gobble, MD Author Type: Physician Filed: 10/02/2019 4:08 PM  Note Status: Written Cosign: Cosign Not Required Encounter Date: 10/02/2019  Problem: Allergic rhinitis  Editor: Collene Gobble, MD (Physician)                 Continue, Allegra, Singulair Mucinex, Flonase    Assessment & Plan Note by Collene Gobble, MD at 10/02/2019 4:06 PM Author: Collene Gobble, MD Author Type: Physician Filed: 10/02/2019 4:08 PM  Note Status: Written Cosign: Cosign Not Required Encounter Date: 10/02/2019  Problem: COPD (chronic obstructive pulmonary disease) [facility])  Editor: Collene Gobble, MD (Physician)                 Mild based on pulmonary function testing from 2018.  She never needs albuterol.  It does probably contribute some to her chronic cough as does her smoking.  She does not show any signs of hypoventilation or respiratory failure.  She is wondering if it is safe from a pulmonary standpoint to get back to her previous Norco dosing both for her pain and also for cough suppression.  She was using 6 a day.  Given how well she tolerated it before and given her pulmonary function testing, functional capacity, I believe that this would be acceptable and would not cause undue risk.     Instructions  Please continue your medications as you have been taking them We will refer you to the lung cancer screening program Try to  cut down your cigarettes as much as possible. It should be okay from a pulmonary standpoint for you to go back to your usual Norco dosing for cough suppression as long as this is approved by Dr. Primus Bravo. Follow with Dr [name] in 6 months or sooner if you have any problems        Was appointment offered to patient (explain)?  Yes, scheduled for 12/05/19 at 9:45 pm   Reason for call: Taking Prednisone and Doxycycline since 11/30/2019, began throwing up for 3-4 days.  Coughing with pale yellow and brown mucus.  C/o sob at rest and with exertion.  She is asking for something for nausea.  She denies any fever, chills, muscle aches, loss of taste or smell.  She was scheduled for a covid test today, but could not go because she was vomiting.  Please advise.  (examples of things to ask: : When did symptoms start? Fever? Cough? Productive? Color to sputum? More sputum than usual? Wheezing? Have you needed increased oxygen? Are you taking your respiratory medications? What over the counter measures have you tried?)  No Known Allergies  Immunization History  Administered Date(s) Administered  . Hepatitis A, Adult 10/02/2018  . Influenza, Quadrivalent, Recombinant, Inj, Pf 11/15/2018  . Influenza,inj,quad, With Preservative 11/23/2018  . PFIZER SARS-COV-2 Vaccination 06/29/2019, 07/20/2019  . Pneumococcal Polysaccharide-23 10/02/2018  . Pneumococcal-Unspecified 11/23/2018  .  Zoster 11/23/2018  . Zoster Recombinat (Shingrix) 10/02/2018, 12/05/2018

## 2019-12-04 NOTE — Telephone Encounter (Signed)
Called pt there was no answer LMOM RTC...lmb 

## 2019-12-04 NOTE — Telephone Encounter (Signed)
She needs to be seen - and I still believe she needs to be COVID tested.  I am ok calling zofran 4mg  q6h prn nausea, disp# 30, RF 0 in the meantime.

## 2019-12-05 ENCOUNTER — Other Ambulatory Visit: Payer: Self-pay

## 2019-12-05 ENCOUNTER — Encounter: Payer: Self-pay | Admitting: Emergency Medicine

## 2019-12-05 ENCOUNTER — Ambulatory Visit (INDEPENDENT_AMBULATORY_CARE_PROVIDER_SITE_OTHER): Payer: PPO | Admitting: Emergency Medicine

## 2019-12-05 DIAGNOSIS — J449 Chronic obstructive pulmonary disease, unspecified: Secondary | ICD-10-CM | POA: Diagnosis not present

## 2019-12-05 MED ORDER — PREDNISONE 20 MG PO TABS: 20.0000 mg | ORAL_TABLET | Freq: Every day | ORAL | 0 refills | Status: DC

## 2019-12-05 MED ORDER — PREDNISONE 10 MG PO TABS: 10.0000 mg | ORAL_TABLET | Freq: Every day | ORAL | 0 refills | Status: DC

## 2019-12-05 MED ORDER — PROMETHAZINE HCL 25 MG RE SUPP: RECTAL | 0 refills | Status: DC

## 2019-12-05 MED ORDER — AZITHROMYCIN 250 MG PO TABS: ORAL_TABLET | ORAL | 0 refills | Status: DC

## 2019-12-05 MED ORDER — ALBUTEROL SULFATE HFA 108 (90 BASE) MCG/ACT IN AERS: 2.0000 | INHALATION_SPRAY | Freq: Four times a day (QID) | RESPIRATORY_TRACT | 6 refills | Status: AC | PRN

## 2019-12-05 NOTE — Progress Notes (Signed)
Subjective:    Patient ID: [name] [name], female    DOB: 1961-02-12, 59 y.o.   MRN: [id number]  Cough Associated symptoms include rhinorrhea and shortness of breath. Pertinent negatives include no ear pain, eye redness, fever, headaches, postnasal drip, rash, sore throat or wheezing.                ROV 06/23/17 --58 year old woman who follows up today for her history of COPD, continued tobacco use, severe chronic cough that is been debilitating.  She has frequent GERD and nausea that are contributors, allergic rhinitis it is a contributor.  She has a right upper lobe pulmonary nodule that we followed with serial CT scans, stable on 02/17/2017.  We performed bronchoscopy 05/31/2017 because she had continued cough and also some episodic hemoptysis.  Her airway evaluation was normal.  AFB, fungal, cell count, cytology all normal.  She continues to have persistent cough. She is using her nasal steroid and nasal washes. She ran out of benadryl, has used some loratadine. She is awakened at night with cough. Had her dexilant increased to 60mg . She doesn';t feel any burning.  She no longer has her norco/ vicodin until next week. She is only smoking a few cigarettes a week.   Acute OV 09/16/17 --patient has COPD, continued tobacco use, severe chronic cough in the setting of GERD, allergic rhinitis.  Bronchoscopy has shown a normal airway evaluation.  Cultures reassuring.  She has a right upper lobe nodule 1.8 x 1.4 cm that is stable since 11/10/2016 on serial CTs most recently 07/25/2017.  She is here today reporting that her cough has increased - more nasal congestion. She has been quite upset and depressed due to the death of her mother last month. She is due to see Allergy in August. She is on flonase, dexilant. She tried [place].   ROV 10/02/19 -- Follow-up visit for 59 year old active smoker (20 pack years, smoking 5-8 a day) with mild COPD, chronic cough due to this as well as GERD, allergic rhinitis.  We had also  been following a 1.8 x 1.4 cm pulmonary nodule.  Most recent CT chest 04/04/2019 which I reviewed, stable.  She has had continued cough and has been on Dexilant, Flonase, Mucinex.  Last time Singulair and flutter valve were added. She also started allegra.  She is not on any scheduled bronchodilator regimen, never needs any albuterol.  Her last prednisone was over a year ago. Still coughs at night the most. She is on Norco for both pain and cough - seems to help her, but she was doing better when she was on 6 Norco a day.   ACUTE OV 12/05/19 --59 year old active smoker with mild COPD, chronic cough with GERD and allergic rhinitis.  Also with a 1.8 x 1.4 cm pulmonary nodule which been been following by CT chest.  She deals with significant chronic cough, has benefited from narcotics in the past.  She presents acutely ill >> started about 5 days ago, developed some increased dyspnea w exertion and easy fatigue. ? Some wheeze and crackling. Her cough became productive. Tried treating with pred + doxy but developed nausea and emesis. She does sometimes have tussive emesis. She stopped both the pred and the doxy. She believes that she feels slightly better, but clearly not at baseline. Remains on singulair, allegra, dexilant. The purulence has improved. She doesn't have an albuterol inhaler. She wants to get phenergan suppositories.  Review of Systems  Constitutional: Negative for fever and unexpected weight change.  HENT: Positive for rhinorrhea. Negative for congestion, dental problem, ear pain, nosebleeds, postnasal drip, sinus pressure, sneezing, sore throat and trouble swallowing.   Eyes: Negative for redness and itching.  Respiratory: Positive for cough and shortness of breath. Negative for chest tightness and wheezing.   Cardiovascular: Negative for palpitations and  leg swelling.  Gastrointestinal: Negative for nausea and vomiting.  Genitourinary: Negative for dysuria.  Musculoskeletal: Negative for joint swelling.  Skin: Negative for rash.  Neurological: Negative for headaches.  Hematological: Does not bruise/bleed easily.  Psychiatric/Behavioral: Negative for dysphoric mood. The patient is not nervous/anxious.        Objective:   Physical Exam  Vitals:   12/05/19 0951  BP: 138/78  Pulse: 90  Temp: (!) 97.4 F (36.3 C)  TempSrc: Temporal  SpO2: 97%  Weight: 220 lb (99.8 kg)  Height: 5\' 5"  (1.651 m)   Gen: pleasant, overwt woman, in no distress,  normal affect  ENT: No lesions,  mouth clear,  oropharynx clear, no postnasal drip  Neck: No JVD, no stridor  Lungs: No use of accessory muscles, clear without rales or rhonchi  Cardiovascular: RRR, heart sounds normal, no murmur or gallops, no peripheral edema  Musculoskeletal: No deformities, no cyanosis or clubbing  Neuro: alert, non focal  Skin: Warm, no lesions or rashes     Assessment & Plan:  COPD (chronic obstructive pulmonary disease) (HCC) Mild exacerbation in the setting of an apparent URI, viral syndrome.  Her home Covid test was negative.  She had a hard time tolerating the higher dose prednisone, doxycycline, may have contributed to nausea and vomiting.  She stopped these.  I will continue her maintenance medications, try a low-dose prednisone taper.  I think this will be adequate because she is not wheezing currently.  Azithromycin.  Phenergan suppositories for her nausea per her request.  Baltazar Apo, MD, PhD 12/05/2019, 10:10 AM [facility] [phone] or if no answer [phone]

## 2019-12-05 NOTE — Assessment & Plan Note (Signed)
Mild exacerbation in the setting of an apparent URI, viral syndrome.  Her home Covid test was negative.  She had a hard time tolerating the higher dose prednisone, doxycycline, may have contributed to nausea and vomiting.  She stopped these.  I will continue her maintenance medications, try a low-dose prednisone taper.  I think this will be adequate because she is not wheezing currently.  Azithromycin.  Phenergan suppositories for her nausea per her request.

## 2019-12-05 NOTE — Telephone Encounter (Signed)
Called pt again still no answer LMOM RTC if still having sxs. Closing encounter....[name] [name]

## 2019-12-05 NOTE — Patient Instructions (Signed)
Continue current maintenance medications as usually take them We will change your prednisone dosing: 20 mg daily for 3 days, then 10 mg daily for 3 days and then stop Do not restart doxycycline for now Start azithromycin and take until completely gone as directed Okay to use Zofran if needed for nausea since you already have this We will write a prescription for Phenergan 12.5 mg suppositories that you can use up to every 6 hours if needed for nausea We will write a prescription for albuterol.  You can use 2 puffs if needed for shortness of breath, chest tightness, wheezing. Follow with Dr [name] in 3 months or sooner if you have any problems.

## 2019-12-06 ENCOUNTER — Telehealth: Payer: Self-pay | Admitting: Emergency Medicine

## 2019-12-06 NOTE — Telephone Encounter (Signed)
Spoke with the pt  She states wanting to know if she is contagious  She was seen 10/14 and dx with URI  She has a low grade fever  I advised that she could be contagious to best to keep to herself until better or wear mask if has to be around others  Nothing further needed

## 2019-12-07 DIAGNOSIS — Z20822 Contact with and (suspected) exposure to covid-19: Secondary | ICD-10-CM | POA: Diagnosis not present

## 2019-12-11 ENCOUNTER — Other Ambulatory Visit: Payer: Self-pay | Admitting: Emergency Medicine

## 2019-12-11 ENCOUNTER — Telehealth: Payer: Self-pay | Admitting: Emergency Medicine

## 2019-12-11 MED ORDER — PROMETHAZINE HCL 25 MG RE SUPP
RECTAL | 0 refills | Status: DC
Start: 2019-12-11 — End: 2019-12-18

## 2019-12-11 MED ORDER — BENZONATATE 200 MG PO CAPS: 200.0000 mg | ORAL_CAPSULE | Freq: Three times a day (TID) | ORAL | 0 refills | Status: DC | PRN

## 2019-12-11 NOTE — Telephone Encounter (Signed)
OK to refill the promethazine supp  Best thing to try for the cough is probably delsym  We can also give her tessalon perles 200mg  if she thinks this will help.

## 2019-12-11 NOTE — Telephone Encounter (Signed)
Called and spoke with patient to let her know recs from Dr. [name]. She expressed understanding and verified pharmacy. RX sent in. Nothing further needed at this time.

## 2019-12-11 NOTE — Telephone Encounter (Signed)
Called patient to clarify if she needed a refill of the promethazine suppositories.  She advised that she did need them refilled.  She states that she is still coughing a lot, sometimes it is a very deep cough and she coughs to the point of vomiting.  She said the mucus has changed from yellow to thick white and it causes her to gag.  She is requesting a refill of the promethazine suppositories and a recommendation of what to take for the cough.  Her pain management doctor said she cannot be prescribed anything with a narcotic in it, but is he can prescribe something to calm her calf so she does not continue to vomit.  Dr. [name], please advise on refilling the promethazine suppositories and a cough syrup with no narcotic in it.  Thank you.

## 2019-12-14 DIAGNOSIS — Z20822 Contact with and (suspected) exposure to covid-19: Secondary | ICD-10-CM | POA: Diagnosis not present

## 2019-12-14 MED ORDER — PROMETHAZINE HCL 25 MG RE SUPP: 25.0000 mg | Freq: Four times a day (QID) | RECTAL | 0 refills | Status: DC | PRN

## 2019-12-14 NOTE — Telephone Encounter (Signed)
Pt had URI recently and was prescribed prednisone and doxycycline. She got very nauseated with the doxy and was changed to zpack and lower dose pred. She reports she continues to have nausea and cough. She call last week and got Ellouise Newer PA who refilled the promethazine supp for her but told her for future refills she should contact pulmonary as the nausea was related to meds they prescribed. Pt has had more refills since then by pulmonary. Pt states she has tried the zofran but it just does not work for her. Calling today requesting a refill on promethazine supp. States pulmonary would not refill it as it was a GI issue. Last refill was 10/19 but she has only gotten 10 at a time, she is using about 3/day. Pt is calling for a refill on promethazine supp. Please advise.

## 2019-12-14 NOTE — Telephone Encounter (Signed)
Pt called stating that she needs more suppositories, she stated that her pulmonologist told her that he cannot prescribe them because it is a GI issue. Pt does not know what to do. Pls call her.

## 2019-12-14 NOTE — Addendum Note (Signed)
Addended by: Rosanne Sack R on: 12/14/2019 03:38 PM   Modules accepted: Orders

## 2019-12-14 NOTE — Telephone Encounter (Signed)
Refill sent in for pt and she is aware.

## 2019-12-14 NOTE — Telephone Encounter (Signed)
Okay for 1 refill.  If her symptom is not due to a a primary GI problem, then she can reach out to her PCP moving forward

## 2019-12-16 ENCOUNTER — Telehealth: Payer: Self-pay | Admitting: Internal Medicine

## 2019-12-16 NOTE — Telephone Encounter (Signed)
Called answering service c/o no better still coughing   LMOM we will need to get her in this week to regroup unless Dr [name] has additional thoughts on what to add to her regimen

## 2019-12-17 ENCOUNTER — Ambulatory Visit: Payer: PPO | Admitting: Cardiology

## 2019-12-17 NOTE — Telephone Encounter (Signed)
Spoke with the pt  She states called on call service 12/16/19 because she was having a "bad coughing spell" She states she feels better today, and not coughing much  She states her symptoms are really only bothering her at night, wakes her up- cough and wheezing  Her cough is prod with white sputum- not any more than usual for her  She reports that she tried using her albuterol inhaler and this did help, however, she used it about every 2-3 hours during the night  She is asking if there is another inhaler that we can call in for her that she can take daily to see if this helps her cough  She has already finished the pred taper and azithromycin given to her at last visit Still taking tessalon pearles, singulair, flonase  She has had no increased SOB, f/c/s or body aches  She has been covid vaccinated and just had a neg covid test on 12/14/19  Forwarding to APP of the day since Dr [name] off this wk  Please advise thanks! Last ov AVS (12/05/19):  Continue current maintenance medications as usually take them We will change your prednisone dosing: 20 mg daily for 3 days, then 10 mg daily for 3 days and then stop Do not restart doxycycline for now Start azithromycin and take until completely gone as directed Okay to use Zofran if needed for nausea since you already have this We will write a prescription for Phenergan 12.5 mg suppositories that you can use up to every 6 hours if needed for nausea We will write a prescription for albuterol.  You can use 2 puffs if needed for shortness of breath, chest tightness, wheezing. Follow with Dr [name] in 3 months or sooner if you have any problems.

## 2019-12-17 NOTE — Telephone Encounter (Signed)
12/17/2019  No other inhaler option that we could start her on from a triage standpoint.  Would recommend that she get scheduled for a closer follow-up with Dr. [name] or an APP this week to regroup on management of her medications as well as her symptoms.  This cannot be managed telephonically.  Please schedule an appointment.  Patient declines that she can follow-up with primary care,  urgent care, or the emergency room if symptoms worsen  Wyn Quaker, FNP

## 2019-12-17 NOTE — Telephone Encounter (Signed)
Called and spoke with pt letting her know the info stated by Aaron Edelman and she verbalized understanding. Pt has been scheduled an appt with [facility] tomorrow 10/26. Nothing further needed.

## 2019-12-18 ENCOUNTER — Other Ambulatory Visit: Payer: Self-pay

## 2019-12-18 ENCOUNTER — Ambulatory Visit (INDEPENDENT_AMBULATORY_CARE_PROVIDER_SITE_OTHER): Payer: PPO | Admitting: Primary Care

## 2019-12-18 ENCOUNTER — Encounter: Payer: Self-pay | Admitting: Primary Care

## 2019-12-18 VITALS — BP 130/88 | HR 90 | Temp 98.1°F | Ht 65.0 in | Wt 218.4 lb

## 2019-12-18 DIAGNOSIS — J449 Chronic obstructive pulmonary disease, unspecified: Secondary | ICD-10-CM

## 2019-12-18 MED ORDER — SPIRIVA RESPIMAT 2.5 MCG/ACT IN AERS: 2.0000 | INHALATION_SPRAY | Freq: Every day | RESPIRATORY_TRACT | 0 refills | Status: DC

## 2019-12-18 NOTE — Patient Instructions (Addendum)
Recommendations: - Spiriva 2 puffs once daily (sample provided, let us know if medication helps and we will send in prescription) - Take Prednisone 40mg  x 5 days; then 20mg  x 5 days; then stop (if you need refill just call our office) - Continue Albuterol 2 puffs every 4-6 hours as needed for shortness of breath/wheezing, Singulair 10mg  at bedtime, Allegra and Flonase nasal spray  - Continue to work on weight loss efforts   Orders: - Needs pulmonary function testing re: shortness of breath  Follow-up: - After PFTs    Chronic Obstructive Pulmonary Disease Chronic obstructive pulmonary disease (COPD) is a long-term (chronic) lung problem. When you have COPD, it is hard for air to get in and out of your lungs. Usually the condition gets worse over time, and your lungs will never return to normal. There are things you can do to keep yourself as healthy as possible.  Your doctor may treat your condition with: ? Medicines. ? Oxygen. ? Lung surgery.  Your doctor may also recommend: ? Rehabilitation. This includes steps to make your body work better. It may involve a team of specialists. ? Quitting smoking, if you smoke. ? Exercise and changes to your diet. ? Comfort measures (palliative care). Follow these instructions at home: Medicines  Take over-the-counter and prescription medicines only as told by your doctor.  Talk to your doctor before taking any cough or allergy medicines. You may need to avoid medicines that cause your lungs to be dry. Lifestyle  If you smoke, stop. Smoking makes the problem worse. If you need help quitting, ask your doctor.  Avoid being around things that make your breathing worse. This may include smoke, chemicals, and fumes.  Stay active, but remember to rest as well.  Learn and use tips on how to relax.  Make sure you get enough sleep. Most adults need at least 7 hours of sleep every night.  Eat healthy foods. Eat smaller meals more often. Rest  before meals. Controlled breathing Learn and use tips on how to control your breathing as told by your doctor. Try:  Breathing in (inhaling) through your nose for 1 second. Then, pucker your lips and breath out (exhale) through your lips for 2 seconds.  Putting one hand on your belly (abdomen). Breathe in slowly through your nose for 1 second. Your hand on your belly should move out. Pucker your lips and breathe out slowly through your lips. Your hand on your belly should move in as you breathe out.  Controlled coughing Learn and use controlled coughing to clear mucus from your lungs. Follow these steps: 1. Lean your head a little forward. 2. Breathe in deeply. 3. Try to hold your breath for 3 seconds. 4. Keep your mouth slightly open while coughing 2 times. 5. Spit any mucus out into a tissue. 6. Rest and do the steps again 1 or 2 times as needed. General instructions  Make sure you get all the shots (vaccines) that your doctor recommends. Ask your doctor about a flu shot and a pneumonia shot.  Use oxygen therapy and pulmonary rehabilitation if told by your doctor. If you need home oxygen therapy, ask your doctor if you should buy a tool to measure your oxygen level (oximeter).  Make a COPD action plan with your doctor. This helps you to know what to do if you feel worse than usual.  Manage any other conditions you have as told by your doctor.  Avoid going outside when it is very hot,  cold, or humid.  Avoid people who have a sickness you can catch (contagious).  Keep all follow-up visits as told by your doctor. This is important. Contact a doctor if:  You cough up more mucus than usual.  There is a change in the color or thickness of the mucus.  It is harder to breathe than usual.  Your breathing is faster than usual.  You have trouble sleeping.  You need to use your medicines more often than usual.  You have trouble doing your normal activities such as getting dressed  or walking around the house. Get help right away if:  You have shortness of breath while resting.  You have shortness of breath that stops you from: ? Being able to talk. ? Doing normal activities.  Your chest hurts for longer than 5 minutes.  Your skin color is more blue than usual.  Your pulse oximeter shows that you have low oxygen for longer than 5 minutes.  You have a fever.  You feel too tired to breathe normally. Summary  Chronic obstructive pulmonary disease (COPD) is a long-term lung problem.  The way your lungs work will never return to normal. Usually the condition gets worse over time. There are things you can do to keep yourself as healthy as possible.  Take over-the-counter and prescription medicines only as told by your doctor.  If you smoke, stop. Smoking makes the problem worse. This information is not intended to replace advice given to you by your health care provider. Make sure you discuss any questions you have with your health care provider. Document Revised: 01/21/2017 Document Reviewed: 03/15/2016 Elsevier Patient Education  2020 [place].   COVID-19 COVID-19 is a respiratory infection that is caused by a virus called severe acute respiratory syndrome coronavirus 2 (SARS-CoV-2). The disease is also known as coronavirus disease or novel coronavirus. In some people, the virus may not cause any symptoms. In others, it may cause a serious infection. The infection can get worse quickly and can lead to complications, such as:  Pneumonia, or infection of the lungs.  Acute respiratory distress syndrome or ARDS. This is a condition in which fluid build-up in the lungs prevents the lungs from filling with air and passing oxygen into the blood.  Acute respiratory failure. This is a condition in which there is not enough oxygen passing from the lungs to the body or when carbon dioxide is not passing from the lungs out of the body.  Sepsis or septic shock.  This is a serious bodily reaction to an infection.  Blood clotting problems.  Secondary infections due to bacteria or fungus.  Organ failure. This is when your body's organs stop working. The virus that causes COVID-19 is contagious. This means that it can spread from person to person through droplets from coughs and sneezes (respiratory secretions). What are the causes? This illness is caused by a virus. You may catch the virus by:  Breathing in droplets from an infected person. Droplets can be spread by a person breathing, speaking, singing, coughing, or sneezing.  Touching something, like a table or a doorknob, that was exposed to the virus (contaminated) and then touching your mouth, nose, or eyes. What increases the risk? Risk for infection You are more likely to be infected with this virus if you:  Are within 6 feet (2 meters) of a person with COVID-19.  Provide care for or live with a person who is infected with COVID-19.  Spend time in crowded indoor  spaces or live in shared housing. Risk for serious illness You are more likely to become seriously ill from the virus if you:  Are 80 years of age or older. The higher your age, the more you are at risk for serious illness.  Live in a nursing home or long-term care facility.  Have cancer.  Have a long-term (chronic) disease such as: ? Chronic lung disease, including chronic obstructive pulmonary disease or asthma. ? A long-term disease that lowers your body's ability to fight infection (immunocompromised). ? Heart disease, including heart failure, a condition in which the arteries that lead to the heart become narrow or blocked (coronary artery disease), a disease which makes the heart muscle thick, weak, or stiff (cardiomyopathy). ? Diabetes. ? Chronic kidney disease. ? Sickle cell disease, a condition in which red blood cells have an abnormal "sickle" shape. ? Liver disease.  Are obese. What are the signs or  symptoms? Symptoms of this condition can range from mild to severe. Symptoms may appear any time from 2 to 14 days after being exposed to the virus. They include:  A fever or chills.  A cough.  Difficulty breathing.  Headaches, body aches, or muscle aches.  Runny or stuffy (congested) nose.  A sore throat.  New loss of taste or smell. Some people may also have stomach problems, such as nausea, vomiting, or diarrhea. Other people may not have any symptoms of COVID-19. How is this diagnosed? This condition may be diagnosed based on:  Your signs and symptoms, especially if: ? You live in an area with a COVID-19 outbreak. ? You recently traveled to or from an area where the virus is common. ? You provide care for or live with a person who was diagnosed with COVID-19. ? You were exposed to a person who was diagnosed with COVID-19.  A physical exam.  Lab tests, which may include: ? Taking a sample of fluid from the back of your nose and throat (nasopharyngeal fluid), your nose, or your throat using a swab. ? A sample of mucus from your lungs (sputum). ? Blood tests.  Imaging tests, which may include, X-rays, CT scan, or ultrasound. How is this treated? At present, there is no medicine to treat COVID-19. Medicines that treat other diseases are being used on a trial basis to see if they are effective against COVID-19. Your health care provider will talk with you about ways to treat your symptoms. For most people, the infection is mild and can be managed at home with rest, fluids, and over-the-counter medicines. Treatment for a serious infection usually takes places in a hospital intensive care unit (ICU). It may include one or more of the following treatments. These treatments are given until your symptoms improve.  Receiving fluids and medicines through an IV.  Supplemental oxygen. Extra oxygen is given through a tube in the nose, a face mask, or a hood.  Positioning you to lie  on your stomach (prone position). This makes it easier for oxygen to get into the lungs.  Continuous positive airway pressure (CPAP) or bi-level positive airway pressure (BPAP) machine. This treatment uses mild air pressure to keep the airways open. A tube that is connected to a motor delivers oxygen to the body.  Ventilator. This treatment moves air into and out of the lungs by using a tube that is placed in your windpipe.  Tracheostomy. This is a procedure to create a hole in the neck so that a breathing tube can be inserted.  Extracorporeal membrane oxygenation (ECMO). This procedure gives the lungs a chance to recover by taking over the functions of the heart and lungs. It supplies oxygen to the body and removes carbon dioxide. Follow these instructions at home: Lifestyle  If you are sick, stay home except to get medical care. Your health care provider will tell you how long to stay home. Call your health care provider before you go for medical care.  Rest at home as told by your health care provider.  Do not use any products that contain nicotine or tobacco, such as cigarettes, e-cigarettes, and chewing tobacco. If you need help quitting, ask your health care provider.  Return to your normal activities as told by your health care provider. Ask your health care provider what activities are safe for you. General instructions  Take over-the-counter and prescription medicines only as told by your health care provider.  Drink enough fluid to keep your urine pale yellow.  Keep all follow-up visits as told by your health care provider. This is important. How is this prevented?  There is no vaccine to help prevent COVID-19 infection. However, there are steps you can take to protect yourself and others from this virus. To protect yourself:   Do not travel to areas where COVID-19 is a risk. The areas where COVID-19 is reported change often. To identify high-risk areas and travel  restrictions, check the CDC travel website: FatFares.com.br  If you live in, or must travel to, an area where COVID-19 is a risk, take precautions to avoid infection. ? Stay away from people who are sick. ? Wash your hands often with soap and water for 20 seconds. If soap and water are not available, use an alcohol-based hand sanitizer. ? Avoid touching your mouth, face, eyes, or nose. ? Avoid going out in public, follow guidance from your state and local health authorities. ? If you must go out in public, wear a cloth face covering or face mask. Make sure your mask covers your nose and mouth. ? Avoid crowded indoor spaces. Stay at least 6 feet (2 meters) away from others. ? Disinfect objects and surfaces that are frequently touched every day. This may include:  Counters and tables.  Doorknobs and light switches.  Sinks and faucets.  Electronics, such as phones, remote controls, keyboards, computers, and tablets. To protect others: If you have symptoms of COVID-19, take steps to prevent the virus from spreading to others.  If you think you have a COVID-19 infection, contact your health care provider right away. Tell your health care team that you think you may have a COVID-19 infection.  Stay home. Leave your house only to seek medical care. Do not use public transport.  Do not travel while you are sick.  Wash your hands often with soap and water for 20 seconds. If soap and water are not available, use alcohol-based hand sanitizer.  Stay away from other members of your household. Let healthy household members care for children and pets, if possible. If you have to care for children or pets, wash your hands often and wear a mask. If possible, stay in your own room, separate from others. Use a different bathroom.  Make sure that all people in your household wash their hands well and often.  Cough or sneeze into a tissue or your sleeve or elbow. Do not cough or sneeze into  your hand or into the air.  Wear a cloth face covering or face mask. Make sure your mask covers  your nose and mouth. Where to find more information  Centers for Disease Control and Prevention: PurpleGadgets.be  World Health Organization: https://www.castaneda.info/ Contact a health care provider if:  You live in or have traveled to an area where COVID-19 is a risk and you have symptoms of the infection.  You have had contact with someone who has COVID-19 and you have symptoms of the infection. Get help right away if:  You have trouble breathing.  You have pain or pressure in your chest.  You have confusion.  You have bluish lips and fingernails.  You have difficulty waking from sleep.  You have symptoms that get worse. These symptoms may represent a serious problem that is an emergency. Do not wait to see if the symptoms will go away. Get medical help right away. Call your local emergency services (911 in the [place] Do not drive yourself to the hospital. Let the emergency medical personnel know if you think you have COVID-19. Summary  COVID-19 is a respiratory infection that is caused by a virus. It is also known as coronavirus disease or novel coronavirus. It can cause serious infections, such as pneumonia, acute respiratory distress syndrome, acute respiratory failure, or sepsis.  The virus that causes COVID-19 is contagious. This means that it can spread from person to person through droplets from breathing, speaking, singing, coughing, or sneezing.  You are more likely to develop a serious illness if you are 31 years of age or older, have a weak immune system, live in a nursing home, or have chronic disease.  There is no medicine to treat COVID-19. Your health care provider will talk with you about ways to treat your symptoms.  Take steps to protect yourself and others from infection. Wash your hands often and disinfect objects and  surfaces that are frequently touched every day. Stay away from people who are sick and wear a mask if you are sick. This information is not intended to replace advice given to you by your health care provider. Make sure you discuss any questions you have with your health care provider. Document Revised: 12/08/2018 Document Reviewed: 03/16/2018 Elsevier Patient Education  [place].

## 2019-12-18 NOTE — Progress Notes (Signed)
$'@Patient'F$  ID: [name] [name], female    DOB: 31-May-1960, 59 y.o.   MRN: [id number]  Chief Complaint  Patient presents with  . Follow-up    Pt still has problems with SOB which is worse with exertion and also has complaints of cough with white phlegm.    Referring provider: [name],*  HPI: 59 year old female former smoker quit in March 2021 (11.5-pack-year history).  Past medical history significant for COPD, allergic rhinitis, diaphragmatic hernia, hypertension, GERD, ulcerative colitis.  Patient of Dr. [name], last seen in office on 13/21.  12/18/2019 Patient presents today for 2-week follow-up.  During last visit she was given prescription for azithromycin and prednisone dosing was changed to 20 mg x 3 days then 10 mg in 3 days then stop.  She was sent in prescription for promethazine and Tessalon Perles for cough. Called answering service on 12/16/2019 for bad coughing spells.  She reports increased shortness of breath and wheezing. She wakes up every 2 hours d/t cough and difficulty breathing. She has had this cough for many years. Albuterol does help her cough. She is compliant with Singulair, Allegra, Flonase and takes Dexilant 60 mg once daily. Denies fever, chills, chest tightness, N/V/D.  No Known Allergies  Immunization History  Administered Date(s) Administered  . Hepatitis A, Adult 10/02/2018  . Influenza, Quadrivalent, Recombinant, Inj, Pf 11/15/2018  . Influenza,inj,quad, With Preservative 11/23/2018  . PFIZER SARS-COV-2 Vaccination 06/29/2019, 07/20/2019  . Pneumococcal Polysaccharide-23 10/02/2018  . Pneumococcal-Unspecified 11/23/2018  . Zoster 11/23/2018  . Zoster Recombinat (Shingrix) 10/02/2018, 12/05/2018    Past Medical History:  Diagnosis Date  . ABDOMINAL PAIN -GENERALIZED 09/27/2008   Qualifier: Diagnosis of  By: Trellis Paganini PA-c, Amy S   . Acute epigastric pain 04/27/2011  . Acute pancreatitis 08/12/2012  . Allergic rhinitis 09/05/2017  . Anal  fistula   . Anemia   . Anxiety   . Arthritis   . BIPOLAR AFFECTIVE DISORDER 12/06/2006   Qualifier: History of  By: Elsworth Soho MD, Leanna Sato Bipolar disorder ([place])   . Chronic kidney disease   . COPD (chronic obstructive pulmonary disease) ([place]) 11/08/2017  . Cough 12/06/2006   Annotation: chronic since'96, nml spirometry, +ve methacholine challenge, nml  CT sinuses, inconclusive pH probe, esophageal manometry Centricity Description: SYMPTOM, COUGH Qualifier: History of  By: Elsworth Soho MD, Leanna Sato   Centricity Description: COUGH Qualifier: Diagnosis of  By: Elsworth Soho MD, Leanna Sato    . Depressive disorder, not elsewhere classified   . Diarrhea 09/27/2008   Qualifier: Diagnosis of  By: Trellis Paganini PA-c, Amy S   . Diverticulosis of colon (without mention of hemorrhage)   . Elevated liver enzymes 10/31/2015  . Esophageal reflux   . ETOH abuse 08/14/2012  . Fatty liver   . Gastritis 04/28/2011  . GERD (gastroesophageal reflux disease)   . Hematochezia 08/14/2012  . Hemoptysis 05/26/2017  . HEMORRHOIDS 09/12/2007   Qualifier: Diagnosis of  By: Nils Pyle CMA ([place]), Mearl Latin    . Hepatic steatosis   . Hiatal hernia   . HIATAL HERNIA 09/12/2007   Qualifier: Diagnosis of  By: Nils Pyle CMA ([place]), Mearl Latin    . History of recurrent UTIs   . Hyperlipidemia   . Hypertension   . Hypertensive retinopathy of both eyes 11/24/2015  . INFECTIOUS DIARRHEA 09/27/2008   Qualifier: Diagnosis of  By: Laney Potash, Pam    . Iron deficiency   . Iron overload 09/22/2017  . Nausea alone 09/27/2008   Qualifier: Diagnosis of  By: Laney Potash, Danley Danker AND VOMITING 09/27/2008   Qualifier: Diagnosis of  By: Shane Crutch, Amy S   . Obesity   . Obesity, Class III, BMI 40-49.9 (morbid obesity) ([place]) 04/27/2011  . Pancreatitis   . Personal history of colonic polyps 10/16/2009   hyperplastic  . Polyarthralgia 12/02/2015   Evaluated by Dr. Charlestine Night (rheumatologist) 12/29/2015:1): 1)possible fibromyalgia (Suggest physical therapy and  flexeril), 2) Polyarthralgia/Insomnia (suggest use of robaxin or flexeril), 3) Nicotine Dependence (counseled to quit). 4)Depression (continue therapy with psychiatrist)  . Pre-diabetes   . Pseudocyst of pancreas 08/12/2012  . Pulmonary nodule, right 11/29/2016  . Rectal fissure 05/11/2011  . Rectal fistula 05/11/2011  . RECTAL PAIN 09/12/2007   Qualifier: Diagnosis of  By: Nils Pyle CMA ([place]), Mearl Latin    . Secondary hemochromatosis 09/22/2017  . Seizures ([place])   . SIRS (systemic inflammatory response syndrome) ([place]) 03/11/2013  . Solitary rectal ulcer 04/12/2011  . TOBACCO ABUSE 12/06/2006   Qualifier: History of  By: Elsworth Soho MD, Leanna Sato.    . Ulcerative colitis, unspecified   . Ulcerative proctitis, nonspecific ([place]) 03/10/2011  . Varicose veins of both lower extremities 12/02/2015    Tobacco History: Social History   Tobacco Use  Smoking Status Former Smoker  . Packs/day: 0.25  . Years: 46.00  . Pack years: 11.50  . Types: Cigars, Cigarettes  . Quit date: 04/23/2019  . Years since quitting: 0.6  Smokeless Tobacco Never Used  Tobacco Comment   Pt states vaping every day    Counseling given: Not Answered Comment: Pt states vaping every day    Outpatient Medications Prior to Visit  Medication Sig Dispense Refill  . albuterol (VENTOLIN HFA) 108 (90 Base) MCG/ACT inhaler Inhale 2 puffs into the lungs every 6 (six) hours as needed for wheezing or shortness of breath. 18 g 6  . ARIPiprazole (ABILIFY) 30 MG tablet     . atorvastatin (LIPITOR) 40 MG tablet TAKE ONE TABLET BY MOUTH DAILY AT 6PM AS DIRECTED 90 tablet 3  . benzonatate (TESSALON) 200 MG capsule Take 1 capsule (200 mg total) by mouth 3 (three) times daily as needed for cough. 30 capsule 0  . dexlansoprazole (DEXILANT) 60 MG capsule Take 1 capsule (60 mg total) by mouth daily. Office visit for further refills 30 capsule 2  . diclofenac sodium (VOLTAREN) 1 % GEL Apply 2 g topically 4 (four) times daily.     . fexofenadine (ALLEGRA)  180 MG tablet Take 1 tablet (180 mg total) by mouth daily. 30 tablet 11  . FLUoxetine (PROZAC) 20 MG capsule Take 20 mg by mouth 2 (two) times daily.     . fluticasone (FLONASE) 50 MCG/ACT nasal spray SPRAY TWO SPRAYS IN EACH NOSTRIL ONCE DAILY 48 g 2  . HYDROcodone-acetaminophen (NORCO/VICODIN) 5-325 MG tablet Take 1 tablet by mouth 4 (four) times daily as needed for moderate pain.     Marland Kitchen lamoTRIgine (LAMICTAL) 100 MG tablet Take 100 mg by mouth 2 (two) times daily.     Marland Kitchen losartan (COZAAR) 50 MG tablet Take 1 tablet (50 mg total) by mouth daily. 90 tablet 1  . methocarbamol (ROBAXIN) 500 MG tablet Take 500 mg by mouth 2 (two) times daily as needed for muscle spasms.     . montelukast (SINGULAIR) 10 MG tablet TAKE ONE TABLET BY MOUTH AT BEDTIME 90 tablet 2  . Multiple Vitamin (MULTIVITAMIN WITH MINERALS) TABS tablet Take 1 tablet by mouth daily. 30 tablet 0  . ondansetron (  ZOFRAN) 4 MG tablet Take 1 tablet (4 mg total) by mouth every 6 (six) hours as needed for nausea or vomiting. 30 tablet 0  . promethazine (PHENERGAN) 25 MG suppository Place 1 suppository (25 mg total) rectally every 6 (six) hours as needed for nausea or vomiting. 12 each 0  . Respiratory Therapy Supplies (FLUTTER) DEVI 3 puffs by Does not apply route 4 (four) times daily as needed. Needs to use device 20 min 3-4 times a day for coughing 1 each 0  . traMADol (ULTRAM) 50 MG tablet     . NARCAN 4 MG/0.1ML LIQD nasal spray kit Place 0.4 mg into the nose once.  (Patient not taking: Reported on 12/18/2019)    . predniSONE (DELTASONE) 20 MG tablet Take 1 tablet (20 mg total) by mouth daily with breakfast. (Patient not taking: Reported on 12/18/2019) 3 tablet 0  . azithromycin (ZITHROMAX Z-PAK) 250 MG tablet Take 2 tabs today, then 1 tab until gone 6 each 0  . doxycycline (VIBRA-TABS) 100 MG tablet Take 1 tablet (100 mg total) by mouth 2 (two) times daily. (Patient not taking: Reported on 12/05/2019) 14 tablet 0  . predniSONE (DELTASONE)  10 MG tablet Take 1 tablet (10 mg total) by mouth daily with breakfast. 3 tablet 0  . promethazine (PHENERGAN) 25 MG suppository INSERT 1 SUPPOSITORY RECTALLY EVERY 6 HOURS AS NEEDED FOR NAUSEA/VOMITING 10 suppository 0   No facility-administered medications prior to visit.    Review of Systems  Review of Systems  Constitutional: Negative.   Respiratory: Positive for cough, shortness of breath and wheezing.     Physical Exam  BP 130/88 (BP Location: Right Arm, Cuff Size: Normal)   Pulse 90   Temp 98.1 F (36.7 C) (Oral)   Ht $R'5\' 5"'ed$  (1.651 m)   Wt 218 lb 6.4 oz (99.1 kg)   SpO2 98%   BMI 36.34 kg/m  Physical Exam Constitutional:      Appearance: Normal appearance.  HENT:     Mouth/Throat:     Comments: Deferred d/t masking Cardiovascular:     Rate and Rhythm: Normal rate and regular rhythm.  Pulmonary:     Effort: Pulmonary effort is normal.     Breath sounds: Normal breath sounds. No wheezing, rhonchi or rales.  Neurological:     Mental Status: She is alert.  Psychiatric:        Mood and Affect: Mood normal.        Behavior: Behavior normal.        Thought Content: Thought content normal.        Judgment: Judgment normal.      Lab Results:  CBC    Component Value Date/Time   WBC 4.3 11/06/2019 1149   RBC 3.18 (L) 11/06/2019 1149   HGB 11.2 (L) 11/06/2019 1149   HCT 32.9 (L) 11/06/2019 1149   PLT 174.0 11/06/2019 1149   MCV 103.3 (H) 11/06/2019 1149   MCH 33.9 (H) 09/19/2019 1029   MCHC 34.2 11/06/2019 1149   RDW 14.4 11/06/2019 1149   LYMPHSABS 1.4 11/06/2019 1149   MONOABS 0.5 11/06/2019 1149   EOSABS 0.1 11/06/2019 1149   BASOSABS 0.1 11/06/2019 1149    BMET    Component Value Date/Time   NA 131 (L) 11/06/2019 1149   K 4.2 11/06/2019 1149   CL 95 (L) 11/06/2019 1149   CO2 27 11/06/2019 1149   GLUCOSE 112 (H) 11/06/2019 1149   BUN 9 11/06/2019 1149   CREATININE 0.73 11/06/2019 1149  CREATININE 0.83 09/19/2019 1029   CALCIUM 9.2 11/06/2019  1149   GFRNONAA >90 03/15/2013 0502   GFRAA >90 03/15/2013 0502    BNP No results found for: BNP  ProBNP No results found for: PROBNP  Imaging: No results found.   Assessment & Plan:   COPD (chronic obstructive pulmonary disease) ([place]) - Patient reports coughing spells with associated shortness of breath and wheezing. She improved with prednisone. Taking Singulair, Allegra, flonase and Dexilant as prescribed. Due to prior hx of smoking we will try LAMA sample, send in additional prednisone course and will get pulmonary function testing.   Rx: - Spiriva Respimat 2 puffs once daily  - Take Prednisone $RemoveBeforeD'40mg'EokUoFlSYcqpSR$  x 5 days; then $RemoveBe'20mg'ouOFthbmH$  x 5 days; then stop (if you need refill just call our office)  Orders: - Needs pulmonary function testing with follow-up afterwards   Martyn Ehrich, NP 12/24/2019

## 2019-12-19 ENCOUNTER — Telehealth: Payer: Self-pay | Admitting: Emergency Medicine

## 2019-12-19 DIAGNOSIS — G894 Chronic pain syndrome: Secondary | ICD-10-CM | POA: Diagnosis not present

## 2019-12-19 DIAGNOSIS — M5136 Other intervertebral disc degeneration, lumbar region: Secondary | ICD-10-CM | POA: Diagnosis not present

## 2019-12-19 DIAGNOSIS — M7060 Trochanteric bursitis, unspecified hip: Secondary | ICD-10-CM | POA: Diagnosis not present

## 2019-12-19 DIAGNOSIS — M179 Osteoarthritis of knee, unspecified: Secondary | ICD-10-CM | POA: Diagnosis not present

## 2019-12-19 NOTE — Telephone Encounter (Signed)
Spoke with pt. She is aware of Beth's response. Pt requested to have her PFT and OV moved up. PFT has been moved to 12/24/19 at 1300 and her OV has been moved to 12/25/19 at 1200. Nothing further was needed.

## 2019-12-19 NOTE — Telephone Encounter (Signed)
LMTCB x1 for pt.  

## 2019-12-19 NOTE — Telephone Encounter (Signed)
I would not say that her lung condition is currently stable. She was just seen by [place] and was having issues with her breathing. She was to take prednisone and needs PFTs. We can review this at her next visit after pulmonary function testing. Otherwise letter needs to come from Dr. [name]

## 2019-12-19 NOTE — Telephone Encounter (Signed)
Spoke with pt. She is needing a letter from our office stating that her lung condition is stable. Her pain management MD will not give her narcotics until they get this letter. The office is closed on Thursdays and Fridays so this letter needs to be sent to them today or the pt will have to do without her medication until Monday.  Beth - please advise as RB is out of the office. Thanks.

## 2019-12-19 NOTE — Telephone Encounter (Signed)
Pt returning a phone call. Pt stated that the fax number for [phone]. Pt can be reached at [phone].

## 2019-12-20 ENCOUNTER — Ambulatory Visit: Payer: PPO | Admitting: Adult Health

## 2019-12-20 ENCOUNTER — Encounter: Payer: PPO | Admitting: Internal Medicine

## 2019-12-24 ENCOUNTER — Telehealth: Payer: Self-pay | Admitting: Primary Care

## 2019-12-24 ENCOUNTER — Ambulatory Visit: Payer: PPO

## 2019-12-24 ENCOUNTER — Other Ambulatory Visit: Payer: Self-pay

## 2019-12-24 ENCOUNTER — Encounter: Payer: Self-pay | Admitting: Primary Care

## 2019-12-24 DIAGNOSIS — J449 Chronic obstructive pulmonary disease, unspecified: Secondary | ICD-10-CM

## 2019-12-24 LAB — PULMONARY FUNCTION TEST
DL/VA % pred: 103 %
DL/VA: 4.35 ml/min/mmHg/L
DLCO cor % pred: 102 %
DLCO cor: 21.68 ml/min/mmHg
DLCO unc % pred: 95 %
DLCO unc: 20.06 ml/min/mmHg
FEF 25-75 Post: 3.22 L/sec
FEF 25-75 Pre: 3.2 L/sec
FEF2575-%Change-Post: 0 %
FEF2575-%Pred-Post: 129 %
FEF2575-%Pred-Pre: 128 %
FEV1-%Change-Post: 0 %
FEV1-%Pred-Post: 104 %
FEV1-%Pred-Pre: 103 %
FEV1-Post: 2.82 L
FEV1-Pre: 2.81 L
FEV1FVC-%Change-Post: -2 %
FEV1FVC-%Pred-Pre: 106 %
FEV6-%Change-Post: 3 %
FEV6-%Pred-Post: 102 %
FEV6-%Pred-Pre: 99 %
FEV6-Post: 3.46 L
FEV6-Pre: 3.36 L
FEV6FVC-%Change-Post: 0 %
FEV6FVC-%Pred-Post: 103 %
FEV6FVC-%Pred-Pre: 103 %
FVC-%Change-Post: 3 %
FVC-%Pred-Post: 99 %
FVC-%Pred-Pre: 96 %
FVC-Post: 3.48 L
FVC-Pre: 3.36 L
Post FEV1/FVC ratio: 81 %
Post FEV6/FVC ratio: 100 %
Pre FEV1/FVC ratio: 83 %
Pre FEV6/FVC Ratio: 100 %
RV % pred: 80 %
RV: 1.6 L
TLC % pred: 95 %
TLC: 4.98 L

## 2019-12-24 NOTE — Telephone Encounter (Signed)
Called and spoke to patient, who is requesting clarification on PFT results.  Patient stated that she reviewed PFT on mychart and it showed COPD. I explained to patient that COPD is the dx that was used to order the test.  She stated that her pain management provider Dr. Primus Bravo needs to know if she is stable enough to continue pain medication.   Beth, please advise. Thanks

## 2019-12-24 NOTE — Assessment & Plan Note (Addendum)
-   Patient reports coughing spells with associated shortness of breath and wheezing. She improved with prednisone. Taking Singulair, Allegra, flonase and Dexilant as prescribed. Due to prior hx of smoking we will try LAMA sample, send in additional prednisone course and will get pulmonary function testing.   Rx: - Spiriva Respimat 2 puffs once daily  - Take Prednisone 40mg  x 5 days; then 20mg  x 5 days; then stop (if you need refill just call our office)  Orders: - Needs pulmonary function testing with follow-up afterwards

## 2019-12-25 ENCOUNTER — Telehealth: Payer: Self-pay | Admitting: Primary Care

## 2019-12-25 ENCOUNTER — Encounter: Payer: Self-pay | Admitting: Primary Care

## 2019-12-25 ENCOUNTER — Ambulatory Visit (INDEPENDENT_AMBULATORY_CARE_PROVIDER_SITE_OTHER): Payer: PPO | Admitting: Primary Care

## 2019-12-25 ENCOUNTER — Encounter: Payer: Self-pay | Admitting: *Deleted

## 2019-12-25 VITALS — BP 130/80 | HR 95 | Temp 98.0°F | Ht 65.0 in | Wt 220.0 lb

## 2019-12-25 DIAGNOSIS — Z23 Encounter for immunization: Secondary | ICD-10-CM | POA: Diagnosis not present

## 2019-12-25 DIAGNOSIS — J449 Chronic obstructive pulmonary disease, unspecified: Secondary | ICD-10-CM | POA: Diagnosis not present

## 2019-12-25 MED ORDER — SPIRIVA RESPIMAT 2.5 MCG/ACT IN AERS: 2.0000 | INHALATION_SPRAY | Freq: Every day | RESPIRATORY_TRACT | 11 refills | Status: AC

## 2019-12-25 NOTE — Telephone Encounter (Signed)
Noted  

## 2019-12-25 NOTE — Assessment & Plan Note (Addendum)
-   Clinically improved with additional of LAMA, quit smoking October 2021 - 12/24/19- FEV1 2.82(104%), ratio 81 p spiriva  - Continue Spiriva Respimat 2.55mcg two puffs daily (RX sent) - We will fax letter to Dr. Primus Bravo office  - Received influenza vaccine today  - Follow-up in 6 months with Dr. [name] or sooner if symptoms exacerbate

## 2019-12-25 NOTE — Telephone Encounter (Signed)
Letter faxed to Dr. Ethel Rana office at [phone] as requested.  Received a fax call report stating it was successfully sent.  Nothing further needed.

## 2019-12-25 NOTE — Progress Notes (Signed)
$'@Patient'R$  ID: [name] [name], female    DOB: 08-31-1960, 59 y.o.   MRN: [id number]  Chief Complaint  Patient presents with  . Follow-up    Referring provider: [name],*  HPI: 59 year old female former smoker quit in March 2021 (11.5-pack-year history).  Past medical history significant for COPD, allergic rhinitis, diaphragmatic hernia, hypertension, GERD, ulcerative colitis.  Patient of Dr. [name].  Previous [facility] pulmonary encounter: 12/18/2019 Patient presents today for 2-week follow-up.  During last visit she was given prescription for azithromycin and prednisone dosing was changed to 20 mg x 3 days then 10 mg in 3 days then stop.  She was sent in prescription for promethazine and Tessalon Perles for cough. Called answering service on 12/16/2019 for bad coughing spells.  She reports increased shortness of breath and wheezing. She wakes up every 2 hours d/t cough and difficulty breathing. She has had this cough for many years. Albuterol does help her cough. She is compliant with Singulair, Allegra, Flonase and takes Dexilant 60 mg once daily. Denies fever, chills, chest tightness, N/V/D.  12/25/2019- Interim hx Patient presents today for 1 week follow-up. She is feeling very well today, no acute respiratory complaints. During last visit given sample of Spiriva and ordered for PFTs. Reports significant improvement with additiona of Spiriva and Singulair/Allegra. States that she is not coughing as much and feeling more rested. She follows with Dr. Primus Bravo for management of back and neck pain. She takes Norco 5/325 1 tablet every 4 hours as needed. She would like to know if her lung condition is stable enough to continue pain medication per their direction. She has also quit smoking recently.   Pulmonary function testing: 12/25/2019 - FVC 3/48 (99%), FEV1 2.82(104%), ratio 81, DLCO 21.68 (102%) Patient took Spiriva morning of testing   No Known Allergies  Immunization History    Administered Date(s) Administered  . Hepatitis A, Adult 10/02/2018  . Influenza, Quadrivalent, Recombinant, Inj, Pf 11/15/2018  . Influenza,inj,Quad PF,6+ Mos 12/25/2019  . Influenza,inj,quad, With Preservative 11/23/2018  . PFIZER SARS-COV-2 Vaccination 06/29/2019, 07/20/2019  . Pneumococcal Polysaccharide-23 10/02/2018  . Pneumococcal-Unspecified 11/23/2018  . Zoster 11/23/2018  . Zoster Recombinat (Shingrix) 10/02/2018, 12/05/2018    Past Medical History:  Diagnosis Date  . ABDOMINAL PAIN -GENERALIZED 09/27/2008   Qualifier: Diagnosis of  By: Trellis Paganini PA-c, Amy S   . Acute epigastric pain 04/27/2011  . Acute pancreatitis 08/12/2012  . Allergic rhinitis 09/05/2017  . Anal fistula   . Anemia   . Anxiety   . Arthritis   . BIPOLAR AFFECTIVE DISORDER 12/06/2006   Qualifier: History of  By: Elsworth Soho MD, Leanna Sato Bipolar disorder ([place])   . Chronic kidney disease   . COPD (chronic obstructive pulmonary disease) ([place]) 11/08/2017  . Cough 12/06/2006   Annotation: chronic since'96, nml spirometry, +ve methacholine challenge, nml  CT sinuses, inconclusive pH probe, esophageal manometry Centricity Description: SYMPTOM, COUGH Qualifier: History of  By: Elsworth Soho MD, Leanna Sato   Centricity Description: COUGH Qualifier: Diagnosis of  By: Elsworth Soho MD, Leanna Sato    . Depressive disorder, not elsewhere classified   . Diarrhea 09/27/2008   Qualifier: Diagnosis of  By: Trellis Paganini PA-c, Amy S   . Diverticulosis of colon (without mention of hemorrhage)   . Elevated liver enzymes 10/31/2015  . Esophageal reflux   . ETOH abuse 08/14/2012  . Fatty liver   . Gastritis 04/28/2011  . GERD (gastroesophageal reflux disease)   . Hematochezia 08/14/2012  . Hemoptysis  05/26/2017  . HEMORRHOIDS 09/12/2007   Qualifier: Diagnosis of  By: Nils Pyle CMA ([place]), Mearl Latin    . Hepatic steatosis   . Hiatal hernia   . HIATAL HERNIA 09/12/2007   Qualifier: Diagnosis of  By: Nils Pyle CMA ([place]), Mearl Latin    . History of recurrent UTIs   .  Hyperlipidemia   . Hypertension   . Hypertensive retinopathy of both eyes 11/24/2015  . INFECTIOUS DIARRHEA 09/27/2008   Qualifier: Diagnosis of  By: Laney Potash, Pam    . Iron deficiency   . Iron overload 09/22/2017  . Nausea alone 09/27/2008   Qualifier: Diagnosis of  By: Laney Potash, [place] 09/27/2008   Qualifier: Diagnosis of  By: Shane Crutch, Amy S   . Obesity   . Obesity, Class III, BMI 40-49.9 (morbid obesity) ([place]) 04/27/2011  . Pancreatitis   . Personal history of colonic polyps 10/16/2009   hyperplastic  . Polyarthralgia 12/02/2015   Evaluated by Dr. Charlestine Night (rheumatologist) 12/29/2015:1): 1)possible fibromyalgia (Suggest physical therapy and flexeril), 2) Polyarthralgia/Insomnia (suggest use of robaxin or flexeril), 3) Nicotine Dependence (counseled to quit). 4)Depression (continue therapy with psychiatrist)  . Pre-diabetes   . Pseudocyst of pancreas 08/12/2012  . Pulmonary nodule, right 11/29/2016  . Rectal fissure 05/11/2011  . Rectal fistula 05/11/2011  . RECTAL PAIN 09/12/2007   Qualifier: Diagnosis of  By: Nils Pyle CMA ([place]), Mearl Latin    . Secondary hemochromatosis 09/22/2017  . Seizures ([place])   . SIRS (systemic inflammatory response syndrome) ([place]) 03/11/2013  . Solitary rectal ulcer 04/12/2011  . TOBACCO ABUSE 12/06/2006   Qualifier: History of  By: Elsworth Soho MD, Leanna Sato.    . Ulcerative colitis, unspecified   . Ulcerative proctitis, nonspecific ([place]) 03/10/2011  . Varicose veins of both lower extremities 12/02/2015    Tobacco History: Social History   Tobacco Use  Smoking Status Former Smoker  . Packs/day: 0.25  . Years: 46.00  . Pack years: 11.50  . Types: Cigars, Cigarettes  . Quit date: 04/23/2019  . Years since quitting: 0.6  Smokeless Tobacco Never Used  Tobacco Comment   Pt states vaping every day.  stopped vaping.   Counseling given: Not Answered Comment: Pt states vaping every day.  stopped vaping.   Outpatient Medications Prior to Visit   Medication Sig Dispense Refill  . albuterol (VENTOLIN HFA) 108 (90 Base) MCG/ACT inhaler Inhale 2 puffs into the lungs every 6 (six) hours as needed for wheezing or shortness of breath. 18 g 6  . ARIPiprazole (ABILIFY) 30 MG tablet     . atorvastatin (LIPITOR) 40 MG tablet TAKE ONE TABLET BY MOUTH DAILY AT 6PM AS DIRECTED 90 tablet 3  . benzonatate (TESSALON) 200 MG capsule Take 1 capsule (200 mg total) by mouth 3 (three) times daily as needed for cough. 30 capsule 0  . dexlansoprazole (DEXILANT) 60 MG capsule Take 1 capsule (60 mg total) by mouth daily. Office visit for further refills 30 capsule 2  . diclofenac sodium (VOLTAREN) 1 % GEL Apply 2 g topically 4 (four) times daily.     . fexofenadine (ALLEGRA) 180 MG tablet Take 1 tablet (180 mg total) by mouth daily. 30 tablet 11  . FLUoxetine (PROZAC) 20 MG capsule Take 20 mg by mouth 2 (two) times daily.     . fluticasone (FLONASE) 50 MCG/ACT nasal spray SPRAY TWO SPRAYS IN EACH NOSTRIL ONCE DAILY 48 g 2  . HYDROcodone-acetaminophen (NORCO/VICODIN) 5-325 MG tablet Take 1 tablet by mouth 4 (  four) times daily as needed for moderate pain.     Marland Kitchen lamoTRIgine (LAMICTAL) 100 MG tablet Take 100 mg by mouth 2 (two) times daily.     Marland Kitchen losartan (COZAAR) 50 MG tablet Take 1 tablet (50 mg total) by mouth daily. 90 tablet 1  . methocarbamol (ROBAXIN) 500 MG tablet Take 500 mg by mouth 2 (two) times daily as needed for muscle spasms.     . montelukast (SINGULAIR) 10 MG tablet TAKE ONE TABLET BY MOUTH AT BEDTIME 90 tablet 2  . Multiple Vitamin (MULTIVITAMIN WITH MINERALS) TABS tablet Take 1 tablet by mouth daily. 30 tablet 0  . NARCAN 4 MG/0.1ML LIQD nasal spray kit Place 0.4 mg into the nose once.     . ondansetron (ZOFRAN) 4 MG tablet Take 1 tablet (4 mg total) by mouth every 6 (six) hours as needed for nausea or vomiting. 30 tablet 0  . predniSONE (DELTASONE) 20 MG tablet Take 1 tablet (20 mg total) by mouth daily with breakfast. 3 tablet 0  . promethazine  (PHENERGAN) 25 MG suppository Place 1 suppository (25 mg total) rectally every 6 (six) hours as needed for nausea or vomiting. 12 each 0  . Respiratory Therapy Supplies (FLUTTER) DEVI 3 puffs by Does not apply route 4 (four) times daily as needed. Needs to use device 20 min 3-4 times a day for coughing 1 each 0  . traMADol (ULTRAM) 50 MG tablet     . Tiotropium Bromide Monohydrate (SPIRIVA RESPIMAT) 2.5 MCG/ACT AERS Inhale 2 puffs into the lungs daily. 4 g 0   No facility-administered medications prior to visit.    Review of Systems  Review of Systems  Constitutional: Negative.   Respiratory: Negative for cough, chest tightness, shortness of breath and wheezing.   Musculoskeletal: Positive for back pain and neck pain.    Physical Exam  BP 130/80 (BP Location: Left Arm, Patient Position: Sitting, Cuff Size: Normal)   Pulse 95   Temp 98 F (36.7 C) (Temporal)   Ht 5\' 5"  (1.651 m)   Wt 220 lb (99.8 kg)   SpO2 95%   BMI 36.61 kg/m  Physical Exam Constitutional:      Appearance: Normal appearance.  HENT:     Head: Normocephalic and atraumatic.     Mouth/Throat:     Comments: Deferred d/t masking Cardiovascular:     Rate and Rhythm: Normal rate and regular rhythm.  Pulmonary:     Effort: Pulmonary effort is normal.     Breath sounds: Normal breath sounds. No wheezing, rhonchi or rales.  Skin:    General: Skin is warm and dry.  Neurological:     General: No focal deficit present.     Mental Status: She is alert and oriented to person, place, and time. Mental status is at baseline.  Psychiatric:        Mood and Affect: Mood normal.        Behavior: Behavior normal.        Thought Content: Thought content normal.        Judgment: Judgment normal.      Lab Results:  CBC    Component Value Date/Time   WBC 4.3 11/06/2019 1149   RBC 3.18 (L) 11/06/2019 1149   HGB 11.2 (L) 11/06/2019 1149   HCT 32.9 (L) 11/06/2019 1149   PLT 174.0 11/06/2019 1149   MCV 103.3 (H)  11/06/2019 1149   MCH 33.9 (H) 09/19/2019 1029   MCHC 34.2 11/06/2019 1149   RDW  14.4 11/06/2019 1149   LYMPHSABS 1.4 11/06/2019 1149   MONOABS 0.5 11/06/2019 1149   EOSABS 0.1 11/06/2019 1149   BASOSABS 0.1 11/06/2019 1149    BMET    Component Value Date/Time   NA 131 (L) 11/06/2019 1149   K 4.2 11/06/2019 1149   CL 95 (L) 11/06/2019 1149   CO2 27 11/06/2019 1149   GLUCOSE 112 (H) 11/06/2019 1149   BUN 9 11/06/2019 1149   CREATININE 0.73 11/06/2019 1149   CREATININE 0.83 09/19/2019 1029   CALCIUM 9.2 11/06/2019 1149   GFRNONAA >90 03/15/2013 0502   GFRAA >90 03/15/2013 0502    BNP No results found for: BNP  ProBNP No results found for: PROBNP  Imaging: No results found.   Assessment & Plan:   I suspect patient has degree of mild COPD d/t clinical symptoms, smoking history and improvement with addition of LAMA. Pulmonary function testing does not show any restriction or obstruction but this was after she took Spiriva inhaler. Her respiratory status appears stable and can continue pain management per Dr. Primus Bravo. Congratulated her on quitting smoking!  COPD, mild ([place]) - Clinically improved with additional of LAMA, quit smoking October 2021 - 12/24/19- FEV1 2.82(104%), ratio 81 p spiriva  - Continue Spiriva Respimat 2.75mcg two puffs daily (RX sent) - We will fax letter to Dr. Primus Bravo office  - Received influenza vaccine today  - Follow-up in 6 months with Dr. [name] or sooner if symptoms exacerbate       Martyn Ehrich, NP 12/25/2019

## 2019-12-25 NOTE — Patient Instructions (Addendum)
I suspect you have mild COPD, pulmonary function testing looked normal but this was after you took long acting bronchodilator. I think you are in stable condition in terms of respiratory status and can continue pain management per Dr. Primus Bravo   Congratulations on quitting smoking!!!  Recommendations: - Continue Spiriva 2 puffs once daily in the morning - We will fax letter to Dr. Primus Bravo office   Office treatment: - Influenza vaccine today   Follow-up: - 6 months with Dr. [name] or sooner if symptoms exacerbate    Chronic Obstructive Pulmonary Disease Chronic obstructive pulmonary disease (COPD) is a long-term (chronic) lung problem. When you have COPD, it is hard for air to get in and out of your lungs. Usually the condition gets worse over time, and your lungs will never return to normal. There are things you can do to keep yourself as healthy as possible.  Your doctor may treat your condition with: ? Medicines. ? Oxygen. ? Lung surgery.  Your doctor may also recommend: ? Rehabilitation. This includes steps to make your body work better. It may involve a team of specialists. ? Quitting smoking, if you smoke. ? Exercise and changes to your diet. ? Comfort measures (palliative care). Follow these instructions at home: Medicines  Take over-the-counter and prescription medicines only as told by your doctor.  Talk to your doctor before taking any cough or allergy medicines. You may need to avoid medicines that cause your lungs to be dry. Lifestyle  If you smoke, stop. Smoking makes the problem worse. If you need help quitting, ask your doctor.  Avoid being around things that make your breathing worse. This may include smoke, chemicals, and fumes.  Stay active, but remember to rest as well.  Learn and use tips on how to relax.  Make sure you get enough sleep. Most adults need at least 7 hours of sleep every night.  Eat healthy foods. Eat smaller meals more often. Rest before  meals. Controlled breathing Learn and use tips on how to control your breathing as told by your doctor. Try:  Breathing in (inhaling) through your nose for 1 second. Then, pucker your lips and breath out (exhale) through your lips for 2 seconds.  Putting one hand on your belly (abdomen). Breathe in slowly through your nose for 1 second. Your hand on your belly should move out. Pucker your lips and breathe out slowly through your lips. Your hand on your belly should move in as you breathe out.  Controlled coughing Learn and use controlled coughing to clear mucus from your lungs. Follow these steps: 1. Lean your head a little forward. 2. Breathe in deeply. 3. Try to hold your breath for 3 seconds. 4. Keep your mouth slightly open while coughing 2 times. 5. Spit any mucus out into a tissue. 6. Rest and do the steps again 1 or 2 times as needed. General instructions  Make sure you get all the shots (vaccines) that your doctor recommends. Ask your doctor about a flu shot and a pneumonia shot.  Use oxygen therapy and pulmonary rehabilitation if told by your doctor. If you need home oxygen therapy, ask your doctor if you should buy a tool to measure your oxygen level (oximeter).  Make a COPD action plan with your doctor. This helps you to know what to do if you feel worse than usual.  Manage any other conditions you have as told by your doctor.  Avoid going outside when it is very hot, cold, or humid.  Avoid people who have a sickness you can catch (contagious).  Keep all follow-up visits as told by your doctor. This is important. Contact a doctor if:  You cough up more mucus than usual.  There is a change in the color or thickness of the mucus.  It is harder to breathe than usual.  Your breathing is faster than usual.  You have trouble sleeping.  You need to use your medicines more often than usual.  You have trouble doing your normal activities such as getting dressed or  walking around the house. Get help right away if:  You have shortness of breath while resting.  You have shortness of breath that stops you from: ? Being able to talk. ? Doing normal activities.  Your chest hurts for longer than 5 minutes.  Your skin color is more blue than usual.  Your pulse oximeter shows that you have low oxygen for longer than 5 minutes.  You have a fever.  You feel too tired to breathe normally. Summary  Chronic obstructive pulmonary disease (COPD) is a long-term lung problem.  The way your lungs work will never return to normal. Usually the condition gets worse over time. There are things you can do to keep yourself as healthy as possible.  Take over-the-counter and prescription medicines only as told by your doctor.  If you smoke, stop. Smoking makes the problem worse. This information is not intended to replace advice given to you by your health care provider. Make sure you discuss any questions you have with your health care provider. Document Revised: 01/21/2017 Document Reviewed: 03/15/2016 Elsevier Patient Education  2020 [employer].

## 2019-12-25 NOTE — Telephone Encounter (Signed)
She has a visit today, we will review in office

## 2019-12-25 NOTE — Telephone Encounter (Signed)
Spoke with Damita Dunnings at Dr. Ethel Rana office. He states since he called, they received the letter that [facility] faxed. I also faxed a copy of Beth's office note as well. Nothing further was needed.

## 2019-12-27 ENCOUNTER — Encounter: Payer: PPO | Admitting: Internal Medicine

## 2019-12-28 ENCOUNTER — Ambulatory Visit: Payer: PPO | Attending: Neurosurgery | Admitting: Physical Therapy

## 2019-12-31 ENCOUNTER — Telehealth (INDEPENDENT_AMBULATORY_CARE_PROVIDER_SITE_OTHER): Payer: PPO | Admitting: Family

## 2019-12-31 DIAGNOSIS — J209 Acute bronchitis, unspecified: Secondary | ICD-10-CM | POA: Diagnosis not present

## 2019-12-31 MED ORDER — PREDNISONE 20 MG PO TABS: 20.0000 mg | ORAL_TABLET | Freq: Every day | ORAL | 0 refills | Status: DC

## 2019-12-31 MED ORDER — AZITHROMYCIN 250 MG PO TABS: ORAL_TABLET | ORAL | 0 refills | Status: DC

## 2019-12-31 NOTE — Progress Notes (Signed)
[name] [name] is a 59 y.o. female with the following history as recorded in EpicCare:  Patient Active Problem List   Diagnosis Date Noted  . Pain in left knee 06/12/2019  . COPD, mild ([place]) 11/08/2017  . Iron overload 09/22/2017  . Secondary hemochromatosis 09/22/2017  . Allergic rhinitis 09/05/2017  . Hemoptysis 05/26/2017  . Pulmonary nodule, right 11/29/2016  . Polyarthralgia 12/02/2015  . Varicose veins of both lower extremities 12/02/2015  . Hypertensive retinopathy of both eyes 11/24/2015  . Elevated liver enzymes 10/31/2015  . Hyperglycemia 10/31/2015  . SIRS (systemic inflammatory response syndrome) ([place]) 03/11/2013  . Hypertension   . Hyperlipidemia   . Anemia   . Hematochezia 08/14/2012  . ETOH abuse 08/14/2012  . Pseudocyst of pancreas 08/12/2012  . Acute pancreatitis 08/12/2012  . GERD (gastroesophageal reflux disease) 06/08/2012  . Rectal fissure 05/11/2011  . Rectal fistula 05/11/2011  . Hiatal hernia 04/28/2011  . Gastritis 04/28/2011  . Nausea 04/27/2011  . Acute epigastric pain 04/27/2011  . Obesity, Class III, BMI 40-49.9 (morbid obesity) ([place]) 04/27/2011  . Solitary rectal ulcer 04/12/2011  . Hemorrhoids 03/10/2011  . Ulcerative proctitis, nonspecific ([place]) 03/10/2011  . DIVERTICULOSIS, COLON 10/22/2009  . COLONIC POLYPS, HYPERPLASTIC, HX OF 10/22/2009  . INFECTIOUS DIARRHEA 09/27/2008  . ULCERATIVE PROCTITIS 09/27/2008  . NAUSEA AND VOMITING 09/27/2008  . NAUSEA ALONE 09/27/2008  . DIARRHEA 09/27/2008  . ABDOMINAL PAIN -GENERALIZED 09/27/2008  . DEPRESSION 09/12/2007  . HEMORRHOIDS 09/12/2007  . Diaphragmatic hernia 09/12/2007  . ULCERATIVE COLITIS 09/12/2007  . RECTAL PAIN 09/12/2007  . BIPOLAR AFFECTIVE DISORDER 12/06/2006  . Current every day smoker 12/06/2006  . Esophageal reflux 12/06/2006  . Cough 12/06/2006    Current Outpatient Medications  Medication Sig Dispense Refill  . albuterol (VENTOLIN HFA) 108 (90 Base) MCG/ACT inhaler  Inhale 2 puffs into the lungs every 6 (six) hours as needed for wheezing or shortness of breath. 18 g 6  . ARIPiprazole (ABILIFY) 30 MG tablet     . atorvastatin (LIPITOR) 40 MG tablet TAKE ONE TABLET BY MOUTH DAILY AT 6PM AS DIRECTED 90 tablet 3  . azithromycin (ZITHROMAX) 250 MG tablet 2 tabs po qd x 1 day; 1 tablet per day x 4 days; 6 tablet 0  . benzonatate (TESSALON) 200 MG capsule Take 1 capsule (200 mg total) by mouth 3 (three) times daily as needed for cough. 30 capsule 0  . dexlansoprazole (DEXILANT) 60 MG capsule Take 1 capsule (60 mg total) by mouth daily. Office visit for further refills 30 capsule 2  . diclofenac sodium (VOLTAREN) 1 % GEL Apply 2 g topically 4 (four) times daily.     . fexofenadine (ALLEGRA) 180 MG tablet Take 1 tablet (180 mg total) by mouth daily. 30 tablet 11  . FLUoxetine (PROZAC) 20 MG capsule Take 20 mg by mouth 2 (two) times daily.     . fluticasone (FLONASE) 50 MCG/ACT nasal spray SPRAY TWO SPRAYS IN EACH NOSTRIL ONCE DAILY 48 g 2  . HYDROcodone-acetaminophen (NORCO/VICODIN) 5-325 MG tablet Take 1 tablet by mouth 4 (four) times daily as needed for moderate pain.     Marland Kitchen lamoTRIgine (LAMICTAL) 100 MG tablet Take 100 mg by mouth 2 (two) times daily.     Marland Kitchen losartan (COZAAR) 50 MG tablet Take 1 tablet (50 mg total) by mouth daily. 90 tablet 1  . methocarbamol (ROBAXIN) 500 MG tablet Take 500 mg by mouth 2 (two) times daily as needed for muscle spasms.     Marland Kitchen  montelukast (SINGULAIR) 10 MG tablet TAKE ONE TABLET BY MOUTH AT BEDTIME 90 tablet 2  . Multiple Vitamin (MULTIVITAMIN WITH MINERALS) TABS tablet Take 1 tablet by mouth daily. 30 tablet 0  . NARCAN 4 MG/0.1ML LIQD nasal spray kit Place 0.4 mg into the nose once.     . ondansetron (ZOFRAN) 4 MG tablet Take 1 tablet (4 mg total) by mouth every 6 (six) hours as needed for nausea or vomiting. 30 tablet 0  . predniSONE (DELTASONE) 20 MG tablet Take 1 tablet (20 mg total) by mouth daily with breakfast. 5 tablet 0  .  promethazine (PHENERGAN) 25 MG suppository Place 1 suppository (25 mg total) rectally every 6 (six) hours as needed for nausea or vomiting. 12 each 0  . Respiratory Therapy Supplies (FLUTTER) DEVI 3 puffs by Does not apply route 4 (four) times daily as needed. Needs to use device 20 min 3-4 times a day for coughing 1 each 0  . Tiotropium Bromide Monohydrate (SPIRIVA RESPIMAT) 2.5 MCG/ACT AERS Inhale 2 puffs into the lungs daily. 4 g 11  . traMADol (ULTRAM) 50 MG tablet      No current facility-administered medications for this visit.    Allergies: Doxycycline  Past Medical History:  Diagnosis Date  . ABDOMINAL PAIN -GENERALIZED 09/27/2008   Qualifier: Diagnosis of  By: Trellis Paganini PA-c, Amy S   . Acute epigastric pain 04/27/2011  . Acute pancreatitis 08/12/2012  . Allergic rhinitis 09/05/2017  . Anal fistula   . Anemia   . Anxiety   . Arthritis   . BIPOLAR AFFECTIVE DISORDER 12/06/2006   Qualifier: History of  By: Elsworth Soho MD, Leanna Sato Bipolar disorder ([place])   . Chronic kidney disease   . COPD (chronic obstructive pulmonary disease) ([place]) 11/08/2017  . Cough 12/06/2006   Annotation: chronic since'96, nml spirometry, +ve methacholine challenge, nml  CT sinuses, inconclusive pH probe, esophageal manometry Centricity Description: SYMPTOM, COUGH Qualifier: History of  By: Elsworth Soho MD, Leanna Sato   Centricity Description: COUGH Qualifier: Diagnosis of  By: Elsworth Soho MD, Leanna Sato    . Depressive disorder, not elsewhere classified   . Diarrhea 09/27/2008   Qualifier: Diagnosis of  By: Trellis Paganini PA-c, Amy S   . Diverticulosis of colon (without mention of hemorrhage)   . Elevated liver enzymes 10/31/2015  . Esophageal reflux   . ETOH abuse 08/14/2012  . Fatty liver   . Gastritis 04/28/2011  . GERD (gastroesophageal reflux disease)   . Hematochezia 08/14/2012  . Hemoptysis 05/26/2017  . HEMORRHOIDS 09/12/2007   Qualifier: Diagnosis of  By: Nils Pyle CMA ([place]), Mearl Latin    . Hepatic steatosis   . Hiatal hernia   .  HIATAL HERNIA 09/12/2007   Qualifier: Diagnosis of  By: Nils Pyle CMA ([place]), Mearl Latin    . History of recurrent UTIs   . Hyperlipidemia   . Hypertension   . Hypertensive retinopathy of both eyes 11/24/2015  . INFECTIOUS DIARRHEA 09/27/2008   Qualifier: Diagnosis of  By: Laney Potash, Pam    . Iron deficiency   . Iron overload 09/22/2017  . Nausea alone 09/27/2008   Qualifier: Diagnosis of  By: Laney Potash, [place] 09/27/2008   Qualifier: Diagnosis of  By: Shane Crutch, Amy S   . Obesity   . Obesity, Class III, BMI 40-49.9 (morbid obesity) ([place]) 04/27/2011  . Pancreatitis   . Personal history of colonic polyps 10/16/2009   hyperplastic  . Polyarthralgia 12/02/2015   Evaluated  by Dr. Kellie Simmering (rheumatologist) 12/29/2015:1): 1)possible fibromyalgia (Suggest physical therapy and flexeril), 2) Polyarthralgia/Insomnia (suggest use of robaxin or flexeril), 3) Nicotine Dependence (counseled to quit). 4)Depression (continue therapy with psychiatrist)  . Pre-diabetes   . Pseudocyst of pancreas 08/12/2012  . Pulmonary nodule, right 11/29/2016  . Rectal fissure 05/11/2011  . Rectal fistula 05/11/2011  . RECTAL PAIN 09/12/2007   Qualifier: Diagnosis of  By: Koleen Distance CMA (AAMA), Hulan Saas    . Secondary hemochromatosis 09/22/2017  . Seizures ([facility])   . SIRS (systemic inflammatory response syndrome) (H[facility]) 03/11/2013  . Solitary rectal ulcer 04/12/2011  . TOBACCO ABUSE 12/06/2006   Qualifier: History of  By: Vassie Loll MD, [place].    . Ulcerative colitis, unspecified   . Ulcerative proctitis, nonspecific (H[facility]) 03/10/2011  . Varicose veins of both lower extremities 12/02/2015    Past Surgical History:  Procedure Laterality Date  . FINGER AMPUTATION  2010   right index  . HEMORROIDECTOMY    . KNEE ARTHROSCOPY     bilateral, left x2  . TOTAL ABDOMINAL HYSTERECTOMY    . VIDEO BRONCHOSCOPY Bilateral 05/31/2017   Procedure: VIDEO BRONCHOSCOPY WITHOUT FLUORO;  Surgeon: Leslye Peer, MD;  Location: [facility];  Service: Cardiopulmonary;  Laterality: Bilateral;    Family History  Problem Relation Age of Onset  . Irritable bowel syndrome Mother   . Hypertension Mother   . Atrial fibrillation Mother   . Ovarian cancer Maternal Aunt   . Diabetes Father   . Heart attack Father   . Hypertension Father   . Hyperlipidemia Father   . Thyroid disease Sister   . Hyperlipidemia Sister   . Thyroid disease Brother   . Colon polyps Brother   . Stomach cancer Neg Hx   . Colon cancer Neg Hx     Social History   Tobacco Use  . Smoking status: Former Smoker    Packs/day: 0.25    Years: 46.00    Pack years: 11.50    Types: Cigars, Cigarettes    Quit date: 04/23/2019    Years since quitting: 0.6  . Smokeless tobacco: Never Used  . Tobacco comment: Pt states vaping every day.  stopped vaping.  Substance Use Topics  . Alcohol use: Yes    Alcohol/week: 1.0 standard drink    Types: 1 Shots of liquor per week    Comment: occ    Subjective:   I connected with Caroline More on 12/31/19 at  1:20 PM EST by a video enabled telemedicine application and verified that I am speaking with the correct person using two identifiers.   I discussed the limitations of evaluation and management by telemedicine and the availability of in person appointments. The patient expressed understanding and agreed to proceed. Provider in office/ patient is at home; provider and patient are only 2 people on video call.   Cough x 1 week; concern for flare of bronchitis; has been feeling really good recently with combination of medications given by pulmonology; is traveling to [facility] soon and wants to be as healthy as possible;     Objective:  There were no vitals filed for this visit.  General: Well developed, well nourished, in no acute distress  Head: Normocephalic and atraumatic  Lungs: Respirations unlabored;  Neurologic: Alert and oriented; speech intact; face symmetrical;   Assessment:  1. Acute bronchitis,  unspecified organism     Plan:  Rx for Z-pak and Prednisone; she will follow-up with her pulmonologist if symptoms persist;   No  follow-ups on file.  No orders of the defined types were placed in this encounter.   Requested Prescriptions   Signed Prescriptions Disp Refills  . predniSONE (DELTASONE) 20 MG tablet 5 tablet 0    Sig: Take 1 tablet (20 mg total) by mouth daily with breakfast.  . azithromycin (ZITHROMAX) 250 MG tablet 6 tablet 0    Sig: 2 tabs po qd x 1 day; 1 tablet per day x 4 days;

## 2020-01-03 ENCOUNTER — Telehealth: Payer: Self-pay | Admitting: Radiology

## 2020-01-03 ENCOUNTER — Encounter: Payer: Self-pay | Admitting: Internal Medicine

## 2020-01-03 ENCOUNTER — Ambulatory Visit (INDEPENDENT_AMBULATORY_CARE_PROVIDER_SITE_OTHER): Payer: PPO | Admitting: Internal Medicine

## 2020-01-03 ENCOUNTER — Ambulatory Visit (INDEPENDENT_AMBULATORY_CARE_PROVIDER_SITE_OTHER): Payer: PPO

## 2020-01-03 ENCOUNTER — Other Ambulatory Visit: Payer: Self-pay

## 2020-01-03 VITALS — BP 128/82 | HR 78 | Ht 65.0 in | Wt 224.6 lb

## 2020-01-03 DIAGNOSIS — E66813 Obesity, class 3: Secondary | ICD-10-CM

## 2020-01-03 DIAGNOSIS — I700 Atherosclerosis of aorta: Secondary | ICD-10-CM

## 2020-01-03 DIAGNOSIS — K760 Fatty (change of) liver, not elsewhere classified: Secondary | ICD-10-CM

## 2020-01-03 DIAGNOSIS — R079 Chest pain, unspecified: Secondary | ICD-10-CM | POA: Diagnosis not present

## 2020-01-03 DIAGNOSIS — E782 Mixed hyperlipidemia: Secondary | ICD-10-CM

## 2020-01-03 DIAGNOSIS — R002 Palpitations: Secondary | ICD-10-CM

## 2020-01-03 MED ORDER — METOPROLOL TARTRATE 100 MG PO TABS: ORAL_TABLET | ORAL | 0 refills | Status: DC
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
## 2020-01-03 NOTE — Patient Instructions (Addendum)
Medication Instructions:  Your physician recommends that you continue on your current medications as directed. Please refer to the Current Medication list given to you today.  *If you need a refill on your cardiac medications before your next appointment, please call your pharmacy*   Lab Work: BMET before CT  If you have labs (blood work) drawn today and your tests are completely normal, you will receive your results only by:  [facility] (if you have MyChart) OR  A paper copy in the mail If you have any lab test that is abnormal or we need to change your treatment, we will call you to review the results.   Testing/Procedures: Your physician recommends that you wear a monitor for 14 days.  Your physician recommends that you have a Coronary CT performed.    Follow-Up: At [facility], you and your health needs are our priority.  As part of our continuing mission to provide you with exceptional heart care, we have created designated Provider Care Teams.  These Care Teams include your primary Cardiologist (physician) and Advanced Practice Providers (APPs -  Physician Assistants and Nurse Practitioners) who all work together to provide you with the care you need, when you need it.  We recommend signing up for the patient portal called "MyChart".  Sign up information is provided on this After Visit Summary.  MyChart is used to connect with patients for Virtual Visits (Telemedicine).  Patients are able to view lab/test results, encounter notes, upcoming appointments, etc.  Non-urgent messages can be sent to your provider as well.   To learn more about what you can do with MyChart, go to [email or web address].    Your next appointment:   3 month(s)- make appointment in the morning and please come fasting (nothing to eat or drink after midnight except water and black coffee).  The format for your next appointment:   In Person  Provider:   You may see Rudean Haskell, MD or  one of the following Advanced Practice Providers on your designated Care Team:    Melina Copa, PA-C  Ermalinda Barrios, PA-C    Other Instructions  Your cardiac CT will be scheduled at one of the below locations:   [facility] [address] [place], [place] [address] [phone]  [place] [address] [place], [place] [address] [phone]  If scheduled at [facility], please arrive at the [facility] main entrance of [facility] 30 minutes prior to test start time. Proceed to the [facility] Radiology Department (first floor) to check-in and test prep.  If scheduled at [facility], please arrive 15 mins early for check-in and test prep.  Please follow these instructions carefully (unless otherwise directed):  Hold all erectile dysfunction medications at least 3 days (72 hrs) prior to test.  On the Night Before the Test:  Be sure to Drink plenty of water.  Do not consume any caffeinated/decaffeinated beverages or chocolate 12 hours prior to your test.  Do not take any antihistamines 12 hours prior to your test.   On the Day of the Test:  Drink plenty of water. Do not drink any water within one hour of the test.  Do not eat any food 4 hours prior to the test.  You may take your regular medications prior to the test.   Take metoprolol (Lopressor) two hours prior to test.  HOLD Furosemide/Hydrochlorothiazide morning of the test.  FEMALES- please  wear underwire-free bra if available       After the Test:  Drink plenty of water.  After receiving IV contrast, you may experience a mild flushed feeling. This is normal.  On occasion, you may experience a mild rash up to 24 hours after the test. This is not dangerous. If this occurs, you can take Benadryl 25 mg and increase your fluid intake.  If you experience trouble breathing, this can be serious. If it  is severe call 911 IMMEDIATELY. If it is mild, please call our office.  If you take any of these medications: Glipizide/Metformin, Avandament, Glucavance, please do not take 48 hours after completing test unless otherwise instructed.   Once we have confirmed authorization from your insurance company, we will call you to set up a date and time for your test. Based on how quickly your insurance processes prior authorizations requests, please allow up to 4 weeks to be contacted for scheduling your Cardiac CT appointment. Be advised that routine Cardiac CT appointments could be scheduled as many as 8 weeks after your provider has ordered it.  For non-scheduling related questions, please contact the cardiac imaging nurse navigator should you have any questions/concerns: Marchia Bond, Cardiac Imaging Nurse Navigator Burley Saver, Interim Cardiac Imaging Nurse [place] [facility] Services Direct Office Dial: [phone]   For scheduling needs, including cancellations and rescheduling, please call Vivien Rota at [phone], option 3.

## 2020-01-03 NOTE — Telephone Encounter (Signed)
Enrolled patient for a 14 day Zio XT  monitor to be mailed to patients home  °

## 2020-01-04 DIAGNOSIS — F319 Bipolar disorder, unspecified: Secondary | ICD-10-CM | POA: Diagnosis not present

## 2020-01-04 DIAGNOSIS — F4310 Post-traumatic stress disorder, unspecified: Secondary | ICD-10-CM | POA: Diagnosis not present

## 2020-01-04 DIAGNOSIS — F419 Anxiety disorder, unspecified: Secondary | ICD-10-CM | POA: Diagnosis not present

## 2020-01-05 ENCOUNTER — Telehealth: Payer: Self-pay | Admitting: Physician Assistant

## 2020-01-05 MED ORDER — PROMETHAZINE HCL 25 MG RE SUPP: 25.0000 mg | Freq: Four times a day (QID) | RECTAL | 0 refills | Status: DC | PRN

## 2020-01-05 NOTE — Telephone Encounter (Signed)
Telephone call 01/05/20 8:45 am  Patient called on call service with a complaint of continued nausea and vomiting.  She has had procedures scheduled in December for further evaluation.  Requesting refill of her Phenergan suppositories that she has been using for a while.  Refill Phenergan suppositories 25 mg every 6 hours #30 with 0 refills.  Ellouise Newer, PA-C

## 2020-01-07 ENCOUNTER — Telehealth: Payer: Self-pay | Admitting: Primary Care

## 2020-01-07 NOTE — Telephone Encounter (Signed)
Patient was told by Dr. Valere Dross to follow up with pulmonology if she wasn't better.  Patient states she is heading out of town, she will follow up at an urgent care if she starts to fill worse after finishing the antibiotic.   Will route to BW as FYI

## 2020-01-07 NOTE — Telephone Encounter (Signed)
PCP needs to manage as we didn't see her for sick visit. She would need OV in person to address. PCP can order CXR

## 2020-01-07 NOTE — Telephone Encounter (Signed)
Called and spoke with pt letting her know the info stated by Beth and she verbalized understanding. Nothing further needed. 

## 2020-01-07 NOTE — Telephone Encounter (Signed)
I did not prescribe antibiotic. Recommend she call her PCP [place] who saw her for this.

## 2020-01-07 NOTE — Telephone Encounter (Signed)
Called and spoke to patient.  She states she is still having Green Mucus.  Patient still have 2 days of antibiotics left.  Patient is going out of town today to see family and wants to know if she needs a chest xray since this has been going on for about a month now.   Patient does state she isn't coughing as much with the inhaler regimine but still having the green mucus cough up pea size every 10-20 minutes.   Last seen by Derl Barrow 12/25/19  I suspect you have mild COPD, pulmonary function testing looked normal but this was after you took long acting bronchodilator. I think you are in stable condition in terms of respiratory status and can continue pain management per Dr. Patric Dykes please advise.

## 2020-01-15 ENCOUNTER — Telehealth: Payer: Self-pay | Admitting: Family

## 2020-01-15 NOTE — Telephone Encounter (Signed)
I think it is appropriate to go to [facility].

## 2020-01-15 NOTE — Telephone Encounter (Signed)
Informed pt it was ok to go urgent. Patient was wondering if problems were accruing from bacteria or a virus, patient advise to either call pulmonology or go to urgent care.

## 2020-01-15 NOTE — Telephone Encounter (Signed)
  Seeking advice Patient calling to report since last virtual visit 11/8 she has now developed a sore throat, congestion and diarrhea. She is out of town and apprehensive about going to Urgent Care   Please advise

## 2020-01-17 ENCOUNTER — Encounter: Payer: Self-pay | Admitting: Pulmonary Disease

## 2020-01-17 ENCOUNTER — Telehealth: Payer: Self-pay | Admitting: Pulmonary Disease

## 2020-01-17 DIAGNOSIS — B370 Candidal stomatitis: Secondary | ICD-10-CM

## 2020-01-17 MED ORDER — NYSTATIN 100000 UNIT/ML MT SUSP: 5.0000 mL | Freq: Four times a day (QID) | OROMUCOSAL | 0 refills | Status: DC

## 2020-01-17 NOTE — Telephone Encounter (Signed)
01/17/20   Telephone call from patient on triage line.  Patient is out of town in [place].  She has had multiple rounds of antibiotics from primary care as well as our office.  She is also had 3 rounds of prednisone.  She is reporting a sore throat as well as white patchy areas on her tongue.  Her sister who is a nurse who is with her states the patient may have thrush.  The patient contacted our office out of concerns of this.  She is requesting a prescription of Diflucan.  She is not rinsing her mouth out after using her Spiriva.  We will review this today.  Plan: -Nystatin rinse sent to pharmacy of choice -We will send message to triage team to contact the patient to get her scheduled for a close follow-up when she is back and [place], [place] with either Dr. [name] or EW NP who is seen the patient recently. -Patient is start rinsing mouth out after using inhalers

## 2020-01-18 ENCOUNTER — Ambulatory Visit: Payer: PPO | Admitting: Primary Care

## 2020-01-18 NOTE — Telephone Encounter (Signed)
Called spoke with patient.  She is scheduled with Geraldo Pitter NP when patient returns to [place] on 01/22/20 at 1100.   Nothing further needed at this time.

## 2020-01-21 ENCOUNTER — Ambulatory Visit: Payer: PPO | Admitting: Family

## 2020-01-21 DIAGNOSIS — M5136 Other intervertebral disc degeneration, lumbar region: Secondary | ICD-10-CM | POA: Diagnosis not present

## 2020-01-21 DIAGNOSIS — M7060 Trochanteric bursitis, unspecified hip: Secondary | ICD-10-CM | POA: Diagnosis not present

## 2020-01-21 DIAGNOSIS — M179 Osteoarthritis of knee, unspecified: Secondary | ICD-10-CM | POA: Diagnosis not present

## 2020-01-21 DIAGNOSIS — G894 Chronic pain syndrome: Secondary | ICD-10-CM | POA: Diagnosis not present

## 2020-01-22 ENCOUNTER — Ambulatory Visit: Payer: PPO | Admitting: Primary Care

## 2020-01-22 NOTE — Progress Notes (Deleted)
_0  ID: [name] [name], female    DOB: 12/04/1960, 59 y.o.   MRN: [id number]  No chief complaint on file.   Referring provider: [name],*  HPI:   01/22/2020 Patient has had several rounds of antbiotrics and steriods prescribed by PCP. Reports   Allergies  Allergen Reactions  . Doxycycline     Diarrhea/ nausea    Immunization History  Administered Date(s) Administered  . Hepatitis A, Adult 10/02/2018  . Influenza, Quadrivalent, Recombinant, Inj, Pf 11/15/2018  . Influenza,inj,Quad PF,6+ Mos 12/25/2019  . Influenza,inj,quad, With Preservative 11/23/2018  . PFIZER SARS-COV-2 Vaccination 06/29/2019, 07/20/2019  . Pneumococcal Polysaccharide-23 10/02/2018  . Pneumococcal-Unspecified 11/23/2018  . Zoster 11/23/2018  . Zoster Recombinat (Shingrix) 10/02/2018, 12/05/2018    Past Medical History:  Diagnosis Date  . ABDOMINAL PAIN -GENERALIZED 09/27/2008   Qualifier: Diagnosis of  By: Trellis Paganini PA-c, Amy S   . Acute epigastric pain 04/27/2011  . Acute pancreatitis 08/12/2012  . Allergic rhinitis 09/05/2017  . Anal fistula   . Anemia   . Anxiety   . Arthritis   . BIPOLAR AFFECTIVE DISORDER 12/06/2006   Qualifier: History of  By: Elsworth Soho MD, Leanna Sato Bipolar disorder ([place])   . Chronic kidney disease   . COPD (chronic obstructive pulmonary disease) ([place]) 11/08/2017  . Cough 12/06/2006   Annotation: chronic since'96, nml spirometry, +ve methacholine challenge, nml  CT sinuses, inconclusive pH probe, esophageal manometry Centricity Description: SYMPTOM, COUGH Qualifier: History of  By: Elsworth Soho MD, Leanna Sato   Centricity Description: COUGH Qualifier: Diagnosis of  By: Elsworth Soho MD, Leanna Sato    . Depressive disorder, not elsewhere classified   . Diarrhea 09/27/2008   Qualifier: Diagnosis of  By: Trellis Paganini PA-c, Amy S   . Diverticulosis of colon (without mention of hemorrhage)   . Elevated liver enzymes 10/31/2015  . Esophageal reflux   . ETOH abuse 08/14/2012  . Fatty  liver   . Gastritis 04/28/2011  . GERD (gastroesophageal reflux disease)   . Hematochezia 08/14/2012  . Hemoptysis 05/26/2017  . HEMORRHOIDS 09/12/2007   Qualifier: Diagnosis of  By: Nils Pyle CMA ([place]), Mearl Latin    . Hepatic steatosis   . Hiatal hernia   . HIATAL HERNIA 09/12/2007   Qualifier: Diagnosis of  By: Nils Pyle CMA ([place]), Mearl Latin    . History of recurrent UTIs   . Hyperlipidemia   . Hypertension   . Hypertensive retinopathy of both eyes 11/24/2015  . INFECTIOUS DIARRHEA 09/27/2008   Qualifier: Diagnosis of  By: Laney Potash, Pam    . Iron deficiency   . Iron overload 09/22/2017  . Nausea alone 09/27/2008   Qualifier: Diagnosis of  By: Laney Potash, [place] 09/27/2008   Qualifier: Diagnosis of  By: Shane Crutch, Amy S   . Obesity   . Obesity, Class III, BMI 40-49.9 (morbid obesity) ([place]) 04/27/2011  . Pancreatitis   . Personal history of colonic polyps 10/16/2009   hyperplastic  . Polyarthralgia 12/02/2015   Evaluated by Dr. Charlestine Night (rheumatologist) 12/29/2015:1): 1)possible fibromyalgia (Suggest physical therapy and flexeril), 2) Polyarthralgia/Insomnia (suggest use of robaxin or flexeril), 3) Nicotine Dependence (counseled to quit). 4)Depression (continue therapy with psychiatrist)  . Pre-diabetes   . Pseudocyst of pancreas 08/12/2012  . Pulmonary nodule, right 11/29/2016  . Rectal fissure 05/11/2011  . Rectal fistula 05/11/2011  . RECTAL PAIN 09/12/2007   Qualifier: Diagnosis of  By: Nils Pyle CMA ([place]), Mearl Latin    . Secondary hemochromatosis 09/22/2017  .  Seizures ([place])   . SIRS (systemic inflammatory response syndrome) ([place]) 03/11/2013  . Solitary rectal ulcer 04/12/2011  . TOBACCO ABUSE 12/06/2006   Qualifier: History of  By: Elsworth Soho MD, Leanna Sato.    . Ulcerative colitis, unspecified   . Ulcerative proctitis, nonspecific ([place]) 03/10/2011  . Varicose veins of both lower extremities 12/02/2015    Tobacco History: Social History   Tobacco Use  Smoking Status Former  Smoker  . Packs/day: 0.25  . Years: 46.00  . Pack years: 11.50  . Types: Cigars, Cigarettes  . Quit date: 04/23/2019  . Years since quitting: 0.7  Smokeless Tobacco Never Used  Tobacco Comment   Pt states vaping every day.  stopped vaping.   Counseling given: Not Answered Comment: Pt states vaping every day.  stopped vaping.   Outpatient Medications Prior to Visit  Medication Sig Dispense Refill  . albuterol (VENTOLIN HFA) 108 (90 Base) MCG/ACT inhaler Inhale 2 puffs into the lungs every 6 (six) hours as needed for wheezing or shortness of breath. 18 g 6  . ARIPiprazole (ABILIFY) 30 MG tablet Take 30 mg by mouth daily.     Marland Kitchen atorvastatin (LIPITOR) 40 MG tablet TAKE ONE TABLET BY MOUTH DAILY AT 6PM AS DIRECTED 90 tablet 3  . azithromycin (ZITHROMAX) 250 MG tablet 2 tabs po qd x 1 day; 1 tablet per day x 4 days; 6 tablet 0  . benzonatate (TESSALON) 200 MG capsule Take 1 capsule (200 mg total) by mouth 3 (three) times daily as needed for cough. 30 capsule 0  . dexlansoprazole (DEXILANT) 60 MG capsule Take 1 capsule (60 mg total) by mouth daily. Office visit for further refills 30 capsule 2  . diclofenac sodium (VOLTAREN) 1 % GEL Apply 2 g topically 4 (four) times daily.     . fexofenadine (ALLEGRA) 180 MG tablet Take 1 tablet (180 mg total) by mouth daily. 30 tablet 11  . FLUoxetine (PROZAC) 20 MG capsule Take 20 mg by mouth 2 (two) times daily.     . fluticasone (FLONASE) 50 MCG/ACT nasal spray SPRAY TWO SPRAYS IN EACH NOSTRIL ONCE DAILY 48 g 2  . HYDROcodone-acetaminophen (NORCO/VICODIN) 5-325 MG tablet Take 1 tablet by mouth 5 (five) times daily as needed for moderate pain.     Marland Kitchen lamoTRIgine (LAMICTAL) 100 MG tablet Take 100 mg by mouth 2 (two) times daily.     Marland Kitchen losartan (COZAAR) 50 MG tablet Take 1 tablet (50 mg total) by mouth daily. 90 tablet 1  . methocarbamol (ROBAXIN) 500 MG tablet Take 500 mg by mouth 2 (two) times daily as needed for muscle spasms.     . metoprolol tartrate  (LOPRESSOR) 100 MG tablet Take one tablet by mouth 2 hours prior to your CT 1 tablet 0  . montelukast (SINGULAIR) 10 MG tablet TAKE ONE TABLET BY MOUTH AT BEDTIME 90 tablet 2  . Multiple Vitamin (MULTIVITAMIN WITH MINERALS) TABS tablet Take 1 tablet by mouth daily. 30 tablet 0  . NARCAN 4 MG/0.1ML LIQD nasal spray kit Place 0.4 mg into the nose once.     . nystatin (MYCOSTATIN) 100000 UNIT/ML suspension Take 5 mLs (500,000 Units total) by mouth 4 (four) times daily. 60 mL 0  . ondansetron (ZOFRAN) 4 MG tablet Take 1 tablet (4 mg total) by mouth every 6 (six) hours as needed for nausea or vomiting. 30 tablet 0  . predniSONE (DELTASONE) 20 MG tablet Take 1 tablet (20 mg total) by mouth daily with breakfast. 5 tablet 0  .  promethazine (PHENERGAN) 25 MG suppository Place 1 suppository (25 mg total) rectally every 6 (six) hours as needed for nausea or vomiting. 12 each 0  . promethazine (PHENERGAN) 25 MG suppository Place 1 suppository (25 mg total) rectally every 6 (six) hours as needed for nausea or vomiting. 30 each 0  . Respiratory Therapy Supplies (FLUTTER) DEVI 3 puffs by Does not apply route 4 (four) times daily as needed. Needs to use device 20 min 3-4 times a day for coughing 1 each 0  . Tiotropium Bromide Monohydrate (SPIRIVA RESPIMAT) 2.5 MCG/ACT AERS Inhale 2 puffs into the lungs daily. 4 g 11  . traMADol (ULTRAM) 50 MG tablet Take 50 mg by mouth every 8 (eight) hours as needed (break through pain).      No facility-administered medications prior to visit.      Review of Systems  Review of Systems   Physical Exam  There were no vitals taken for this visit. Physical Exam   Lab Results:  CBC    Component Value Date/Time   WBC 4.3 11/06/2019 1149   RBC 3.18 (L) 11/06/2019 1149   HGB 11.2 (L) 11/06/2019 1149   HCT 32.9 (L) 11/06/2019 1149   PLT 174.0 11/06/2019 1149   MCV 103.3 (H) 11/06/2019 1149   MCH 33.9 (H) 09/19/2019 1029   MCHC 34.2 11/06/2019 1149   RDW 14.4  11/06/2019 1149   LYMPHSABS 1.4 11/06/2019 1149   MONOABS 0.5 11/06/2019 1149   EOSABS 0.1 11/06/2019 1149   BASOSABS 0.1 11/06/2019 1149    BMET    Component Value Date/Time   NA 131 (L) 11/06/2019 1149   K 4.2 11/06/2019 1149   CL 95 (L) 11/06/2019 1149   CO2 27 11/06/2019 1149   GLUCOSE 112 (H) 11/06/2019 1149   BUN 9 11/06/2019 1149   CREATININE 0.73 11/06/2019 1149   CREATININE 0.83 09/19/2019 1029   CALCIUM 9.2 11/06/2019 1149   GFRNONAA >90 03/15/2013 0502   GFRAA >90 03/15/2013 0502    BNP No results found for: BNP  ProBNP No results found for: PROBNP  Imaging: No results found.   Assessment & Plan:   No problem-specific Assessment & Plan notes found for this encounter.     Martyn Ehrich, NP 01/22/2020

## 2020-01-23 ENCOUNTER — Other Ambulatory Visit: Payer: PPO

## 2020-01-23 NOTE — Progress Notes (Deleted)
_0  ID: [name] [name], female    DOB: 03-08-1960, 59 y.o.   MRN: [id number]  No chief complaint on file.   Referring provider: [name],*  HPI:  59 year old female former smoker quit in March 2021 (11.5-pack-year history).  Past medical history significant for COPD, allergic rhinitis, diaphragmatic hernia, hypertension, GERD, ulcerative colitis.  Patient of Dr. [name].  Previous [facility] pulmonary encounter: 12/18/2019 Patient presents today for 2-week follow-up.  During last visit she was given prescription for azithromycin and prednisone dosing was changed to 20 mg x 3 days then 10 mg in 3 days then stop.  She was sent in prescription for promethazine and Tessalon Perles for cough. Called answering service on 12/16/2019 for bad coughing spells.  She reports increased shortness of breath and wheezing. She wakes up every 2 hours d/t cough and difficulty breathing. She has had this cough for many years. Albuterol does help her cough. She is compliant with Singulair, Allegra, Flonase and takes Dexilant 60 mg once daily. Denies fever, chills, chest tightness, N/V/D.  12/25/2019 Patient presents today for 1 week follow-up. She is feeling very well today, no acute respiratory complaints. During last visit given sample of Spiriva and ordered for PFTs. Reports significant improvement with additiona of Spiriva and Singulair/Allegra. States that she is not coughing as much and feeling more rested. She follows with Dr. Primus Bravo for management of back and neck pain. She takes Norco 5/325 1 tablet every 4 hours as needed. She would like to know if her lung condition is stable enough to continue pain medication per their direction. She has also quit smoking recently.   01/24/2020- interim hx Patient presents today for an acute visit with thrush symptoms.  Is been on several rounds of antibiotic and prednisone prescribed by PCP.  During last office visit in November patient had pulmonary function  testing that did not show overt obstruction however she did use Spiriva prior to testing.  Suspect patient has degree of mild COPD due to clinical symptoms and smoking history.  She reported improvement with addition of LAMA.   She called our office 01/17/20 with reports of sore throat and patchy areas on tongue. She was sent in prescription for Nystatin and told to follow-up with our office for inhaler technique.     Pulmonary function testing: 12/25/2019 - FVC 3/48 (99%), FEV1 2.82(104%), ratio 81, DLCO 21.68 (102%) Patient took Spiriva morning of testing    Allergies  Allergen Reactions  . Doxycycline     Diarrhea/ nausea    Immunization History  Administered Date(s) Administered  . Hepatitis A, Adult 10/02/2018  . Influenza, Quadrivalent, Recombinant, Inj, Pf 11/15/2018  . Influenza,inj,Quad PF,6+ Mos 12/25/2019  . Influenza,inj,quad, With Preservative 11/23/2018  . PFIZER SARS-COV-2 Vaccination 06/29/2019, 07/20/2019  . Pneumococcal Polysaccharide-23 10/02/2018  . Pneumococcal-Unspecified 11/23/2018  . Zoster 11/23/2018  . Zoster Recombinat (Shingrix) 10/02/2018, 12/05/2018    Past Medical History:  Diagnosis Date  . ABDOMINAL PAIN -GENERALIZED 09/27/2008   Qualifier: Diagnosis of  By: Trellis Paganini PA-c, Amy S   . Acute epigastric pain 04/27/2011  . Acute pancreatitis 08/12/2012  . Allergic rhinitis 09/05/2017  . Anal fistula   . Anemia   . Anxiety   . Arthritis   . BIPOLAR AFFECTIVE DISORDER 12/06/2006   Qualifier: History of  By: Elsworth Soho MD, Leanna Sato Bipolar disorder ([place])   . Chronic kidney disease   . COPD (chronic obstructive pulmonary disease) ([place]) 11/08/2017  . Cough 12/06/2006  Annotation: chronic since'96, nml spirometry, +ve methacholine challenge, nml  CT sinuses, inconclusive pH probe, esophageal manometry Centricity Description: SYMPTOM, COUGH Qualifier: History of  By: Elsworth Soho MD, Leanna Sato   Centricity Description: COUGH Qualifier: Diagnosis of  By: Elsworth Soho MD,  Leanna Sato    . Depressive disorder, not elsewhere classified   . Diarrhea 09/27/2008   Qualifier: Diagnosis of  By: Trellis Paganini PA-c, Amy S   . Diverticulosis of colon (without mention of hemorrhage)   . Elevated liver enzymes 10/31/2015  . Esophageal reflux   . ETOH abuse 08/14/2012  . Fatty liver   . Gastritis 04/28/2011  . GERD (gastroesophageal reflux disease)   . Hematochezia 08/14/2012  . Hemoptysis 05/26/2017  . HEMORRHOIDS 09/12/2007   Qualifier: Diagnosis of  By: Nils Pyle CMA ([place]), Mearl Latin    . Hepatic steatosis   . Hiatal hernia   . HIATAL HERNIA 09/12/2007   Qualifier: Diagnosis of  By: Nils Pyle CMA ([place]), Mearl Latin    . History of recurrent UTIs   . Hyperlipidemia   . Hypertension   . Hypertensive retinopathy of both eyes 11/24/2015  . INFECTIOUS DIARRHEA 09/27/2008   Qualifier: Diagnosis of  By: Laney Potash, Pam    . Iron deficiency   . Iron overload 09/22/2017  . Nausea alone 09/27/2008   Qualifier: Diagnosis of  By: Laney Potash, [place] 09/27/2008   Qualifier: Diagnosis of  By: Shane Crutch, Amy S   . Obesity   . Obesity, Class III, BMI 40-49.9 (morbid obesity) ([place]) 04/27/2011  . Pancreatitis   . Personal history of colonic polyps 10/16/2009   hyperplastic  . Polyarthralgia 12/02/2015   Evaluated by Dr. Charlestine Night (rheumatologist) 12/29/2015:1): 1)possible fibromyalgia (Suggest physical therapy and flexeril), 2) Polyarthralgia/Insomnia (suggest use of robaxin or flexeril), 3) Nicotine Dependence (counseled to quit). 4)Depression (continue therapy with psychiatrist)  . Pre-diabetes   . Pseudocyst of pancreas 08/12/2012  . Pulmonary nodule, right 11/29/2016  . Rectal fissure 05/11/2011  . Rectal fistula 05/11/2011  . RECTAL PAIN 09/12/2007   Qualifier: Diagnosis of  By: Nils Pyle CMA ([place]), Mearl Latin    . Secondary hemochromatosis 09/22/2017  . Seizures ([place])   . SIRS (systemic inflammatory response syndrome) ([place]) 03/11/2013  . Solitary rectal ulcer 04/12/2011  . TOBACCO  ABUSE 12/06/2006   Qualifier: History of  By: Elsworth Soho MD, Leanna Sato.    . Ulcerative colitis, unspecified   . Ulcerative proctitis, nonspecific ([place]) 03/10/2011  . Varicose veins of both lower extremities 12/02/2015    Tobacco History: Social History   Tobacco Use  Smoking Status Former Smoker  . Packs/day: 0.25  . Years: 46.00  . Pack years: 11.50  . Types: Cigars, Cigarettes  . Quit date: 04/23/2019  . Years since quitting: 0.7  Smokeless Tobacco Never Used  Tobacco Comment   Pt states vaping every day.  stopped vaping.   Counseling given: Not Answered Comment: Pt states vaping every day.  stopped vaping.   Outpatient Medications Prior to Visit  Medication Sig Dispense Refill  . albuterol (VENTOLIN HFA) 108 (90 Base) MCG/ACT inhaler Inhale 2 puffs into the lungs every 6 (six) hours as needed for wheezing or shortness of breath. 18 g 6  . ARIPiprazole (ABILIFY) 30 MG tablet Take 30 mg by mouth daily.     Marland Kitchen atorvastatin (LIPITOR) 40 MG tablet TAKE ONE TABLET BY MOUTH DAILY AT 6PM AS DIRECTED 90 tablet 3  . azithromycin (ZITHROMAX) 250 MG tablet 2 tabs po qd x 1 day;  1 tablet per day x 4 days; 6 tablet 0  . benzonatate (TESSALON) 200 MG capsule Take 1 capsule (200 mg total) by mouth 3 (three) times daily as needed for cough. 30 capsule 0  . dexlansoprazole (DEXILANT) 60 MG capsule Take 1 capsule (60 mg total) by mouth daily. Office visit for further refills 30 capsule 2  . diclofenac sodium (VOLTAREN) 1 % GEL Apply 2 g topically 4 (four) times daily.     . fexofenadine (ALLEGRA) 180 MG tablet Take 1 tablet (180 mg total) by mouth daily. 30 tablet 11  . FLUoxetine (PROZAC) 20 MG capsule Take 20 mg by mouth 2 (two) times daily.     . fluticasone (FLONASE) 50 MCG/ACT nasal spray SPRAY TWO SPRAYS IN EACH NOSTRIL ONCE DAILY 48 g 2  . HYDROcodone-acetaminophen (NORCO/VICODIN) 5-325 MG tablet Take 1 tablet by mouth 5 (five) times daily as needed for moderate pain.     Marland Kitchen lamoTRIgine (LAMICTAL)  100 MG tablet Take 100 mg by mouth 2 (two) times daily.     Marland Kitchen losartan (COZAAR) 50 MG tablet Take 1 tablet (50 mg total) by mouth daily. 90 tablet 1  . methocarbamol (ROBAXIN) 500 MG tablet Take 500 mg by mouth 2 (two) times daily as needed for muscle spasms.     . metoprolol tartrate (LOPRESSOR) 100 MG tablet Take one tablet by mouth 2 hours prior to your CT 1 tablet 0  . montelukast (SINGULAIR) 10 MG tablet TAKE ONE TABLET BY MOUTH AT BEDTIME 90 tablet 2  . Multiple Vitamin (MULTIVITAMIN WITH MINERALS) TABS tablet Take 1 tablet by mouth daily. 30 tablet 0  . NARCAN 4 MG/0.1ML LIQD nasal spray kit Place 0.4 mg into the nose once.     . nystatin (MYCOSTATIN) 100000 UNIT/ML suspension Take 5 mLs (500,000 Units total) by mouth 4 (four) times daily. 60 mL 0  . ondansetron (ZOFRAN) 4 MG tablet Take 1 tablet (4 mg total) by mouth every 6 (six) hours as needed for nausea or vomiting. 30 tablet 0  . predniSONE (DELTASONE) 20 MG tablet Take 1 tablet (20 mg total) by mouth daily with breakfast. 5 tablet 0  . promethazine (PHENERGAN) 25 MG suppository Place 1 suppository (25 mg total) rectally every 6 (six) hours as needed for nausea or vomiting. 12 each 0  . promethazine (PHENERGAN) 25 MG suppository Place 1 suppository (25 mg total) rectally every 6 (six) hours as needed for nausea or vomiting. 30 each 0  . Respiratory Therapy Supplies (FLUTTER) DEVI 3 puffs by Does not apply route 4 (four) times daily as needed. Needs to use device 20 min 3-4 times a day for coughing 1 each 0  . Tiotropium Bromide Monohydrate (SPIRIVA RESPIMAT) 2.5 MCG/ACT AERS Inhale 2 puffs into the lungs daily. 4 g 11  . traMADol (ULTRAM) 50 MG tablet Take 50 mg by mouth every 8 (eight) hours as needed (break through pain).      No facility-administered medications prior to visit.      Review of Systems  Review of Systems   Physical Exam  There were no vitals taken for this visit. Physical Exam   Lab Results:  CBC     Component Value Date/Time   WBC 4.3 11/06/2019 1149   RBC 3.18 (L) 11/06/2019 1149   HGB 11.2 (L) 11/06/2019 1149   HCT 32.9 (L) 11/06/2019 1149   PLT 174.0 11/06/2019 1149   MCV 103.3 (H) 11/06/2019 1149   MCH 33.9 (H) 09/19/2019 1029  MCHC 34.2 11/06/2019 1149   RDW 14.4 11/06/2019 1149   LYMPHSABS 1.4 11/06/2019 1149   MONOABS 0.5 11/06/2019 1149   EOSABS 0.1 11/06/2019 1149   BASOSABS 0.1 11/06/2019 1149    BMET    Component Value Date/Time   NA 131 (L) 11/06/2019 1149   K 4.2 11/06/2019 1149   CL 95 (L) 11/06/2019 1149   CO2 27 11/06/2019 1149   GLUCOSE 112 (H) 11/06/2019 1149   BUN 9 11/06/2019 1149   CREATININE 0.73 11/06/2019 1149   CREATININE 0.83 09/19/2019 1029   CALCIUM 9.2 11/06/2019 1149   GFRNONAA >90 03/15/2013 0502   GFRAA >90 03/15/2013 0502    BNP No results found for: BNP  ProBNP No results found for: PROBNP  Imaging: No results found.   Assessment & Plan:   No problem-specific Assessment & Plan notes found for this encounter.     Martyn Ehrich, NP 01/23/2020

## 2020-01-24 ENCOUNTER — Ambulatory Visit: Payer: PPO | Admitting: Primary Care

## 2020-01-25 ENCOUNTER — Ambulatory Visit: Payer: PPO | Admitting: Family

## 2020-01-26 ENCOUNTER — Encounter: Payer: Self-pay | Admitting: Internal Medicine

## 2020-01-26 ENCOUNTER — Telehealth: Payer: Self-pay | Admitting: Internal Medicine

## 2020-01-26 NOTE — Telephone Encounter (Signed)
She is having thrush and pain in throat. Gets post antibiotics.  Advised to go to local pharmacy to get OTC Candid oral swiss and swallow or antacids liquid for tonight. If worsening to go to ED.   PCP Dr Dayle Points.

## 2020-01-28 ENCOUNTER — Other Ambulatory Visit: Payer: Self-pay | Admitting: Internal Medicine

## 2020-01-29 ENCOUNTER — Ambulatory Visit (AMBULATORY_SURGERY_CENTER): Payer: PPO | Admitting: Internal Medicine

## 2020-01-29 ENCOUNTER — Other Ambulatory Visit: Payer: Self-pay

## 2020-01-29 ENCOUNTER — Encounter: Payer: Self-pay | Admitting: Internal Medicine

## 2020-01-29 VITALS — BP 156/91 | HR 91 | Temp 98.4°F | Resp 21 | Ht 65.0 in | Wt 224.0 lb

## 2020-01-29 DIAGNOSIS — K253 Acute gastric ulcer without hemorrhage or perforation: Secondary | ICD-10-CM

## 2020-01-29 DIAGNOSIS — K766 Portal hypertension: Secondary | ICD-10-CM | POA: Diagnosis not present

## 2020-01-29 DIAGNOSIS — Z86010 Personal history of colonic polyps: Secondary | ICD-10-CM | POA: Diagnosis not present

## 2020-01-29 DIAGNOSIS — K219 Gastro-esophageal reflux disease without esophagitis: Secondary | ICD-10-CM | POA: Diagnosis not present

## 2020-01-29 DIAGNOSIS — K7460 Unspecified cirrhosis of liver: Secondary | ICD-10-CM

## 2020-01-29 DIAGNOSIS — K259 Gastric ulcer, unspecified as acute or chronic, without hemorrhage or perforation: Secondary | ICD-10-CM | POA: Diagnosis not present

## 2020-01-29 DIAGNOSIS — K3189 Other diseases of stomach and duodenum: Secondary | ICD-10-CM | POA: Diagnosis not present

## 2020-01-29 DIAGNOSIS — K625 Hemorrhage of anus and rectum: Secondary | ICD-10-CM

## 2020-01-29 DIAGNOSIS — R195 Other fecal abnormalities: Secondary | ICD-10-CM

## 2020-01-29 MED ORDER — SODIUM CHLORIDE 0.9 % IV SOLN: 500.0000 mL | Freq: Once | INTRAVENOUS | Status: DC

## 2020-01-29 NOTE — Patient Instructions (Signed)
HANDOUTS PROVIDED ON: DIVERTICULOSIS & HEMORRHOIDS  The polyps removed/biopsies taken today have been sent for pathology.  The results can take 1-3 weeks to receive.  When your next colonoscopy should occur will be based on the pathology results.    You may resume your previous diet and medication schedule.  Continue to take your Dexilant.  Avoid alcohol and NSAIDs use.  Thank you for allowing [place] to care for you today!!!   YOU HAD AN ENDOSCOPIC PROCEDURE TODAY AT [facility]:   Refer to the procedure report that was given to you for any specific questions about what was found during the examination.  If the procedure report does not answer your questions, please call your gastroenterologist to clarify.  If you requested that your care partner not be given the details of your procedure findings, then the procedure report has been included in a sealed envelope for you to review at your convenience later.  YOU SHOULD EXPECT: Some feelings of bloating in the abdomen. Passage of more gas than usual.  Walking can help get rid of the air that was put into your GI tract during the procedure and reduce the bloating. If you had a lower endoscopy (such as a colonoscopy or flexible sigmoidoscopy) you may notice spotting of blood in your stool or on the toilet paper. If you underwent a bowel prep for your procedure, you may not have a normal bowel movement for a few days.  Please Note:  You might notice some irritation and congestion in your nose or some drainage.  This is from the oxygen used during your procedure.  There is no need for concern and it should clear up in a day or so.  SYMPTOMS TO REPORT IMMEDIATELY:   Following lower endoscopy (colonoscopy or flexible sigmoidoscopy):  Excessive amounts of blood in the stool  Significant tenderness or worsening of abdominal pains  Swelling of the abdomen that is new, acute  Fever of 100F or higher   Following upper endoscopy  (EGD)  Vomiting of blood or coffee ground material  New chest pain or pain under the shoulder blades  Painful or persistently difficult swallowing  New shortness of breath  Fever of 100F or higher  Black, tarry-looking stools  For urgent or emergent issues, a gastroenterologist can be reached at any hour by calling [phone]. Do not use MyChart messaging for urgent concerns.    DIET:  We do recommend a small meal at first, but then you may proceed to your regular diet.  Drink plenty of fluids but you should avoid alcoholic beverages for 24 hours.  ACTIVITY:  You should plan to take it easy for the rest of today and you should NOT DRIVE or use heavy machinery until tomorrow (because of the sedation medicines used during the test).    FOLLOW UP: Our staff will call the number listed on your records Thursday morning between 7:15 am and 8:15 am to check on you and address any questions or concerns that you may have regarding the information given to you following your procedure. If we do not reach you, we will leave a message.  We will attempt to reach you two times.  During this call, we will ask if you have developed any symptoms of COVID 19. If you develop any symptoms (ie: fever, flu-like symptoms, shortness of breath, cough etc.) before then, please call [phone].  If you test positive for Covid 19 in the 2 weeks post procedure, please call  and report this information to [place].    If any biopsies were taken you will be contacted by phone or by letter within the next 1-3 weeks.  Please call us at [phone] if you have not heard about the biopsies in 3 weeks.    SIGNATURES/CONFIDENTIALITY: You and/or your care partner have signed paperwork which will be entered into your electronic medical record.  These signatures attest to the fact that that the information above on your After Visit Summary has been reviewed and is understood.  Full responsibility of the confidentiality of this  discharge information lies with you and/or your care-partner.

## 2020-01-29 NOTE — Progress Notes (Signed)
PT taken to PACU. Monitors in place. VSS. Report given to RN. 

## 2020-01-29 NOTE — Op Note (Signed)
[facility] Patient Name: [name] [name] Procedure Date: 01/29/2020 9:09 AM MRN: [id number] Endoscopist: Docia Chuck. [name] , MD Age: 59 Referring MD:  Date of Birth: 06/26/1960 Gender: Female Account #: [id number] Procedure:                Upper GI endoscopy with biopsies Indications:              Esophageal reflux, dark stools, Cirrhosis rule out                            esophageal varices. Previous EGD 2019 was normal Medicines:                Monitored Anesthesia Care Procedure:                Pre-Anesthesia Assessment:                           - Prior to the procedure, a History and Physical                            was performed, and patient medications and                            allergies were reviewed. The patient's tolerance of                            previous anesthesia was also reviewed. The risks                            and benefits of the procedure and the sedation                            options and risks were discussed with the patient.                            All questions were answered, and informed consent                            was obtained. Prior Anticoagulants: The patient has                            taken no previous anticoagulant or antiplatelet                            agents. ASA Grade Assessment: III - A patient with                            severe systemic disease. After reviewing the risks                            and benefits, the patient was deemed in                            satisfactory condition to undergo the procedure.  After obtaining informed consent, the endoscope was                            passed under direct vision. Throughout the                            procedure, the patient's blood pressure, pulse, and                            oxygen saturations were monitored continuously. The                            Endoscope was introduced through the mouth, and                             advanced to the second part of duodenum. The upper                            GI endoscopy was accomplished without difficulty.                            The patient tolerated the procedure well. Scope In: Scope Out: Findings:                 The esophagus was normal. No varices.                           The stomach revealed moderate portal hypertensive                            gastropathy throughout as well as superficial                            antral erosions. Biopsies were taken with a cold                            forceps for histology.                           The examined duodenum was normal.                           The cardia and gastric fundus were normal on                            retroflexion, save sliding hiatal hernia. Complications:            No immediate complications. Estimated Blood Loss:     Estimated blood loss: none. Impression:               1. Normal esophagus. No varices                           2. Moderate portal hypertensive gastropathy  3. Antral erosions                           4. Otherwise unremarkable EGD. Recommendation:           - Patient has a contact number available for                            emergencies. The signs and symptoms of potential                            delayed complications were discussed with the                            patient. Return to normal activities tomorrow.                            Written discharge instructions were provided to the                            patient.                           - Resume previous diet.                           - Continue present medications, including PPI                            (Dexilant).                           - Await pathology results.                           - Avoid alcohol and unnecessary NSAIDs                           - Return to the care of your primary provider Docia Chuck. [name], MD 01/29/2020 9:50:09 AM This report has  been signed electronically.

## 2020-01-29 NOTE — Progress Notes (Signed)
Called to room to assist during endoscopic procedure.  Patient ID and intended procedure confirmed with present staff. Received instructions for my participation in the procedure from the performing physician.  

## 2020-01-29 NOTE — Op Note (Signed)
[facility] Patient Name: [name] [name] Procedure Date: 01/29/2020 9:10 AM MRN: [id number] Endoscopist: Docia Chuck. [name] , MD Age: 59 Referring MD:  Date of Birth: [birth year] Gender: Female Account #: [id number] Procedure:                Colonoscopy Indications:              High risk colon cancer surveillance: Personal                            history of multiple (3 or more) adenomas. Most                            recent examination October 2018 Medicines:                Monitored Anesthesia Care Procedure:                Pre-Anesthesia Assessment:                           - Prior to the procedure, a History and Physical                            was performed, and patient medications and                            allergies were reviewed. The patient's tolerance of                            previous anesthesia was also reviewed. The risks                            and benefits of the procedure and the sedation                            options and risks were discussed with the patient.                            All questions were answered, and informed consent                            was obtained. Prior Anticoagulants: The patient has                            taken no previous anticoagulant or antiplatelet                            agents. ASA Grade Assessment: III - A patient with                            severe systemic disease. After reviewing the risks                            and benefits, the patient was deemed in  satisfactory condition to undergo the procedure.                           After obtaining informed consent, the colonoscope                            was passed under direct vision. Throughout the                            procedure, the patient's blood pressure, pulse, and                            oxygen saturations were monitored continuously. The                            Colonoscope was introduced through  the anus and                            advanced to the the cecum, identified by                            appendiceal orifice and ileocecal valve. The                            ileocecal valve, appendiceal orifice, and rectum                            were photographed. The quality of the bowel                            preparation was good. The colonoscopy was performed                            without difficulty. The patient tolerated the                            procedure well. The bowel preparation used was                            SUPREP via split dose instruction. Scope In: 9:21:11 AM Scope Out: 9:32:23 AM Scope Withdrawal Time: 0 hours 7 minutes 58 seconds  Total Procedure Duration: 0 hours 11 minutes 12 seconds  Findings:                 A few small-mouthed diverticula were found in the                            left colon.                           External and internal hemorrhoids were found during                            retroflexion.  The exam was otherwise without abnormality. No                            retroflexion due to narrow rectal vault, though                            excellent view from anal os imaged. Complications:            No immediate complications. Estimated blood loss:                            None. Estimated Blood Loss:     Estimated blood loss: none. Impression:               - Diverticulosis in the left colon.                           - External and internal hemorrhoids.                           - The examination was otherwise normal.                           - No specimens collected. Recommendation:           - Repeat colonoscopy in 5 years for surveillance.                           - Patient has a contact number available for                            emergencies. The signs and symptoms of potential                            delayed complications were discussed with the                             patient. Return to normal activities tomorrow.                            Written discharge instructions were provided to the                            patient.                           - Resume previous diet.                           - Continue present medications. Docia Chuck. [name], MD 01/29/2020 9:36:36 AM This report has been signed electronically.

## 2020-01-30 DIAGNOSIS — K7030 Alcoholic cirrhosis of liver without ascites: Secondary | ICD-10-CM | POA: Diagnosis not present

## 2020-01-30 DIAGNOSIS — F1010 Alcohol abuse, uncomplicated: Secondary | ICD-10-CM | POA: Diagnosis not present

## 2020-01-31 ENCOUNTER — Telehealth: Payer: Self-pay | Admitting: *Deleted

## 2020-01-31 NOTE — Telephone Encounter (Signed)
1. Have you developed a fever since your procedure? no  2.   Have you had an respiratory symptoms (SOB or cough) since your procedure? no  3.   Have you tested positive for COVID 19 since your procedure no  4.   Have you had any family members/close contacts diagnosed with the COVID 19 since your procedure?  no   If yes to any of these questions please route to Joylene John, RN and Joella Prince, RN Follow up Call-  Call back number 01/29/2020  Post procedure Call Back phone  # [phone]  Permission to leave phone message Yes  Some recent data might be hidden     Patient questions:  Do you have a fever, pain , or abdominal swelling? No. Pain Score  0 *  Have you tolerated food without any problems? Yes.    Have you been able to return to your normal activities? Yes.    Do you have any questions about your discharge instructions: Diet   No. Medications  No. Follow up visit  No.  Do you have questions or concerns about your Care? No.  Actions: * If pain score is 4 or above: No action needed, pain <4.

## 2020-02-01 DIAGNOSIS — M1712 Unilateral primary osteoarthritis, left knee: Secondary | ICD-10-CM | POA: Diagnosis not present

## 2020-02-04 ENCOUNTER — Encounter: Payer: Self-pay | Admitting: Internal Medicine

## 2020-02-04 ENCOUNTER — Ambulatory Visit: Payer: PPO

## 2020-02-04 ENCOUNTER — Ambulatory Visit: Payer: PPO | Admitting: Family

## 2020-02-04 ENCOUNTER — Telehealth (HOSPITAL_COMMUNITY): Payer: Self-pay | Admitting: Emergency Medicine

## 2020-02-04 NOTE — Telephone Encounter (Signed)
Reaching out to patient to offer assistance regarding upcoming cardiac imaging study; pt verbalizes understanding of appt date/time, parking situation and where to check in, pre-test NPO status and medications ordered, and verified current allergies; name and call back number provided for further questions should they arise Marchia Bond RN Navigator Cardiac Imaging [place] [facility] [phone] office [phone] cell  Pt reminded to get labs done prior to scan. Pt states she will go tomorrow (12/14) for lab work. Pt verbalized understanding to take 100mg  metoprolol 2 hr prior to scan Clarise Cruz

## 2020-02-05 ENCOUNTER — Other Ambulatory Visit: Payer: PPO

## 2020-02-06 ENCOUNTER — Other Ambulatory Visit: Payer: Self-pay

## 2020-02-06 ENCOUNTER — Ambulatory Visit (HOSPITAL_COMMUNITY)
Admission: RE | Admit: 2020-02-06 | Discharge: 2020-02-06 | Disposition: A | Discharge status: Home / Self Care | Payer: PPO | Source: Ambulatory Visit | Attending: Internal Medicine | Admitting: Internal Medicine

## 2020-02-06 ENCOUNTER — Encounter (HOSPITAL_COMMUNITY): Payer: Self-pay

## 2020-02-06 DIAGNOSIS — I700 Atherosclerosis of aorta: Secondary | ICD-10-CM | POA: Insufficient documentation

## 2020-02-06 DIAGNOSIS — I2510 Atherosclerotic heart disease of native coronary artery without angina pectoris: Secondary | ICD-10-CM | POA: Insufficient documentation

## 2020-02-06 DIAGNOSIS — R079 Chest pain, unspecified: Secondary | ICD-10-CM | POA: Diagnosis not present

## 2020-02-06 DIAGNOSIS — I517 Cardiomegaly: Secondary | ICD-10-CM | POA: Diagnosis not present

## 2020-02-06 DIAGNOSIS — Z006 Encounter for examination for normal comparison and control in clinical research program: Secondary | ICD-10-CM

## 2020-02-06 MED ORDER — IOHEXOL 350 MG/ML SOLN
80.0000 mL | Freq: Once | INTRAVENOUS | Status: AC | PRN
  Administered 2020-02-06: 80 mL via INTRAVENOUS

## 2020-02-06 MED ORDER — NITROGLYCERIN 0.4 MG SL SUBL
SUBLINGUAL_TABLET | SUBLINGUAL | Status: AC
  Filled 2020-02-06: qty 2

## 2020-02-06 MED ORDER — NITROGLYCERIN 0.4 MG SL SUBL
0.8000 mg | SUBLINGUAL_TABLET | Freq: Once | SUBLINGUAL | Status: AC
  Administered 2020-02-06: 0.8 mg via SUBLINGUAL

## 2020-02-06 NOTE — Progress Notes (Signed)
Patient tolerated CT well, Drank a soda and ate crackers after. Ambulated to exit steady gait.

## 2020-02-06 NOTE — Research (Signed)
IDENTIFY Informed Consent   Subject Name: [name] [name]  Subject met inclusion and exclusion criteria.  The informed consent form, study requirements and expectations were reviewed with the subject and questions and concerns were addressed prior to the signing of the consent form.  The subject verbalized understanding of the trial requirements.  The subject agreed to participate in the IDENTIFY trial and signed the informed consent at 0912 on 15/DEC/2021.  The informed consent was obtained prior to performance of any protocol-specific procedures for the subject.  A copy of the signed informed consent was given to the subject and a copy was placed in the subject's medical record.   Sherlyn Lees

## 2020-02-07 ENCOUNTER — Telehealth: Payer: Self-pay | Admitting: Internal Medicine

## 2020-02-07 DIAGNOSIS — I2510 Atherosclerotic heart disease of native coronary artery without angina pectoris: Secondary | ICD-10-CM | POA: Diagnosis not present

## 2020-02-07 MED ORDER — METOPROLOL TARTRATE 25 MG PO TABS: 25.0000 mg | ORAL_TABLET | Freq: Two times a day (BID) | ORAL | 2 refills | Status: DC

## 2020-02-07 MED ORDER — ASPIRIN EC 81 MG PO TBEC: 81.0000 mg | DELAYED_RELEASE_TABLET | Freq: Every day | ORAL | 3 refills | Status: AC

## 2020-02-07 MED ORDER — ATORVASTATIN CALCIUM 80 MG PO TABS: 80.0000 mg | ORAL_TABLET | Freq: Every day | ORAL | 1 refills | Status: DC

## 2020-02-07 NOTE — Telephone Encounter (Signed)
Follow up:     Patient forgot to ask one more thing concering her results.

## 2020-02-07 NOTE — Telephone Encounter (Signed)
Pt advised that Dr. Gasper Sells is still in reviewing her Cardiac CT and we will call her back as soon as he send [place] his results. Pt verbalized understanding and agreed.

## 2020-02-07 NOTE — Telephone Encounter (Signed)
Patient would like someone to go over her CT results with her. She got the results via MyChart but does not quite understand what some of the medical terminology means. Please call to go over test results

## 2020-02-07 NOTE — Telephone Encounter (Signed)
Werner Lean, MD  02/07/2020 11:47 AM EST      Results: Distal vessel LAD CAD Plan: Metoprolol tartrate 25 mg PO BID ASA 81 mg PO Daily Increase atorvastatin to 80 mg PO daily Will move up appointment to discuss results  Werner Lean, MD    Spoke with the pt and informed her that per Dr. Gasper Sells, based on her Coronary CT results, she has distal vessel LAD CAD, and recommends that she start taking ASA 81 mg po daily, metoprolol tartrate 25 mg po bid, increase her atorvastatin to 80 mg po daily, and move her follow-up appt with him up, to a sooner date.  Confirmed the pharmacy of choice with the pt.  Cancelled her Feb appt with Dr. Gasper Sells, and moved her appt up with him to 02/27/20 at 0920.  She is aware to arrive 15 mins prior to this appt.  Advised the pt to monitor her BP/HR at home, and monitor for dizziness with new start of metoprolol.  Advised her to call the office back if she notes abnormal readings and/or has symptoms.  Pt verbalized understanding and agrees with this plan.

## 2020-02-08 NOTE — Telephone Encounter (Signed)
Spoke with the patient and advised her to continue her losartan. She will monitor her HR and BP and let us know if it drops too low or high. Patient verbalized understanding.

## 2020-02-08 NOTE — Telephone Encounter (Signed)
Pt c/o medication issue:  1. Name of Medication: losartan (COZAAR) 50 MG tablet  2. How are you currently taking this medication (dosage and times per day)?  As prescribed  3. Are you having a reaction (difficulty breathing--STAT)? No   4. What is your medication issue? Patient is concerned that with medication adjustments discussed in previous encounter, taking Losartan in addition to these medications may cause side effects. She would like a call back to verify whether or not she is to continue taking Losartan. Please advise.

## 2020-02-11 DIAGNOSIS — R002 Palpitations: Secondary | ICD-10-CM | POA: Diagnosis not present

## 2020-02-12 ENCOUNTER — Telehealth: Payer: Self-pay | Admitting: Internal Medicine

## 2020-02-12 MED ORDER — ATORVASTATIN CALCIUM 40 MG PO TABS: 80.0000 mg | ORAL_TABLET | Freq: Every day | ORAL | 3 refills | Status: AC

## 2020-02-12 NOTE — Telephone Encounter (Signed)
Results not in chart yet.  Patient states she mailed back on 02/04/20.  I told her we would call her when results available. Patient reports 80 mg tablets of Atorvastatin cost around $26 for 90 day supply and 40 mg tablets are around $3 for 90 day supply.  She is asking if prescription can be sent for 2 of the 40 mg tablets daily.  Will send new prescription to [facility].

## 2020-02-12 NOTE — Telephone Encounter (Signed)
New Message  Pt is calling about monitor results   Please call

## 2020-02-20 DIAGNOSIS — M179 Osteoarthritis of knee, unspecified: Secondary | ICD-10-CM | POA: Diagnosis not present

## 2020-02-20 DIAGNOSIS — M5136 Other intervertebral disc degeneration, lumbar region: Secondary | ICD-10-CM | POA: Diagnosis not present

## 2020-02-20 DIAGNOSIS — M7060 Trochanteric bursitis, unspecified hip: Secondary | ICD-10-CM | POA: Diagnosis not present

## 2020-02-20 DIAGNOSIS — G894 Chronic pain syndrome: Secondary | ICD-10-CM | POA: Diagnosis not present

## 2020-02-26 ENCOUNTER — Other Ambulatory Visit: Payer: PPO | Admitting: *Deleted

## 2020-02-26 ENCOUNTER — Other Ambulatory Visit: Payer: Self-pay

## 2020-02-26 DIAGNOSIS — R079 Chest pain, unspecified: Secondary | ICD-10-CM

## 2020-02-27 ENCOUNTER — Ambulatory Visit: Payer: PPO | Admitting: Internal Medicine

## 2020-02-27 LAB — BASIC METABOLIC PANEL
BUN/Creatinine Ratio: 15 (ref 9–23)
BUN: 12 mg/dL (ref 6–24)
CO2: 23 mmol/L (ref 20–29)
Calcium: 8.6 mg/dL — ABNORMAL LOW (ref 8.7–10.2)
Chloride: 99 mmol/L (ref 96–106)
Creatinine, Ser: 0.8 mg/dL (ref 0.57–1.00)
GFR calc Af Amer: 93 mL/min/{1.73_m2} (ref >59)
GFR calc non Af Amer: 81 mL/min/{1.73_m2} (ref >59)
Glucose: 112 mg/dL — ABNORMAL HIGH (ref 65–99)
Potassium: 4.3 mmol/L (ref 3.5–5.2)
Sodium: 136 mmol/L (ref 134–144)

## 2020-02-29 ENCOUNTER — Ambulatory Visit: Payer: PPO | Admitting: Physician Assistant

## 2020-03-04 NOTE — Progress Notes (Signed)
Virtual Visit via Telephone Note   This visit type was conducted due to national recommendations for restrictions regarding the COVID-19 Pandemic (e.g. social distancing) in an effort to limit this patient's exposure and mitigate transmission in our community.  Due to her co-morbid illnesses, this patient is at least at moderate risk for complications without adequate follow up.  This format is felt to be most appropriate for this patient at this time.  The patient did not have access to video technology/had technical difficulties with video requiring transitioning to audio format only (telephone).  All issues noted in this document were discussed and addressed.  No physical exam could be performed with this format.  Please refer to the patient's chart for her  consent to telehealth for [facility].    Date:  03/05/2020   ID:  Caroline More, DOB September 24, 1960, MRN [id number] The patient was identified using 2 identifiers.  Patient Location: Home Provider Location: Home Office  PCP:  Olive Bass, FNP  Cardiologist:  Dr. Izora Ribas  Evaluation Performed:  Follow-Up Visit  Chief Complaint:  Coronary CT follow up   History of Present Illness:    FOREVER ARECHIGA is a 60 y.o. female with history of hypertension, COPD, former tobacco smoker, morbid obesity, hyperlipidemia and NAFLD seen for follow-up.  Establish care with Dr. Izora Ribas  November 2021 chest pain and shortness of breath with and without fluttering sensation.  Has gained weight on prednisone.  Coronary CT showed calcium score of 159.  This was 94th percentile. Mild to moderate dz with positive FFR at 0/73 to dLAD. Monitor showed symptomatic SVT. Started ASA, statin and BB.   Seen virtually for follow up.  Patient reports improved chest discomfort and palpitations since being on medications.  However has not started taking medication till few days ago.  She had a few episode of sudden chest discomfort lasting for  15 to 20 seconds with self resolution.  She was not sure that her heart rate was elevated or not.  Patient reported fatigue and tiredness.  Her heart rate is stable.  Denies orthopnea, PND, syncope, lower extremity edema or melena.   The patient does not have symptoms concerning for COVID-19 infection (fever, chills, cough, or new shortness of breath).    Past Medical History:  Diagnosis Date  . ABDOMINAL PAIN -GENERALIZED 09/27/2008   Qualifier: Diagnosis of  By: Monica Becton PA-c, Amy S   . Acute epigastric pain 04/27/2011  . Acute pancreatitis 08/12/2012  . Allergic rhinitis 09/05/2017  . Anal fistula   . Anemia   . Anxiety   . Arthritis   . BIPOLAR AFFECTIVE DISORDER 12/06/2006   Qualifier: History of  By: Vassie Loll MD, [place] Bipolar disorder (HCC)   . Chronic kidney disease   . COPD (chronic obstructive pulmonary disease) (HCC) 11/08/2017  . Cough 12/06/2006   Annotation: chronic since'96, nml spirometry, +ve methacholine challenge, nml  CT sinuses, inconclusive pH probe, esophageal manometry Centricity Description: SYMPTOM, COUGH Qualifier: History of  By: Vassie Loll MD, [place] Locket   Centricity Description: COUGH Qualifier: Diagnosis of  By: Vassie Loll MD, [place]    . Depressive disorder, not elsewhere classified   . Diarrhea 09/27/2008   Qualifier: Diagnosis of  By: Monica Becton PA-c, Amy S   . Diverticulosis of colon (without mention of hemorrhage)   . Elevated liver enzymes 10/31/2015  . Esophageal reflux   . ETOH abuse 08/14/2012  . Fatty liver   . Gastritis 04/28/2011  .  GERD (gastroesophageal reflux disease)   . Hematochezia 08/14/2012  . Hemoptysis 05/26/2017  . HEMORRHOIDS 09/12/2007   Qualifier: Diagnosis of  By: Nils Pyle CMA ([place]), Mearl Latin    . Hepatic steatosis   . Hiatal hernia   . HIATAL HERNIA 09/12/2007   Qualifier: Diagnosis of  By: Nils Pyle CMA ([place]), Mearl Latin    . History of recurrent UTIs   . Hyperlipidemia   . Hypertension   . Hypertensive retinopathy of both eyes 11/24/2015  .  INFECTIOUS DIARRHEA 09/27/2008   Qualifier: Diagnosis of  By: Laney Potash, Pam    . Iron deficiency   . Iron overload 09/22/2017  . Nausea alone 09/27/2008   Qualifier: Diagnosis of  By: Laney Potash, [place] 09/27/2008   Qualifier: Diagnosis of  By: Shane Crutch, Amy S   . Obesity   . Obesity, Class III, BMI 40-49.9 (morbid obesity) ([place]) 04/27/2011  . Pancreatitis   . Personal history of colonic polyps 10/16/2009   hyperplastic  . Polyarthralgia 12/02/2015   Evaluated by Dr. Charlestine Night (rheumatologist) 12/29/2015:1): 1)possible fibromyalgia (Suggest physical therapy and flexeril), 2) Polyarthralgia/Insomnia (suggest use of robaxin or flexeril), 3) Nicotine Dependence (counseled to quit). 4)Depression (continue therapy with psychiatrist)  . Pre-diabetes   . Pseudocyst of pancreas 08/12/2012  . Pulmonary nodule, right 11/29/2016  . Rectal fissure 05/11/2011  . Rectal fistula 05/11/2011  . RECTAL PAIN 09/12/2007   Qualifier: Diagnosis of  By: Nils Pyle CMA ([place]), Mearl Latin    . Secondary hemochromatosis 09/22/2017  . Seizures ([place])   . SIRS (systemic inflammatory response syndrome) ([place]) 03/11/2013  . Solitary rectal ulcer 04/12/2011  . TOBACCO ABUSE 12/06/2006   Qualifier: History of  By: Elsworth Soho MD, Leanna Sato.    . Ulcerative colitis, unspecified   . Ulcerative proctitis, nonspecific ([place]) 03/10/2011  . Varicose veins of both lower extremities 12/02/2015   Past Surgical History:  Procedure Laterality Date  . FINGER AMPUTATION  2010   right index  . HEMORROIDECTOMY    . KNEE ARTHROSCOPY     bilateral, left x2  . TOTAL ABDOMINAL HYSTERECTOMY    . VIDEO BRONCHOSCOPY Bilateral 05/31/2017   Procedure: VIDEO BRONCHOSCOPY WITHOUT FLUORO;  Surgeon: Collene Gobble, MD;  Location: [facility];  Service: Cardiopulmonary;  Laterality: Bilateral;     Current Meds  Medication Sig  . albuterol (VENTOLIN HFA) 108 (90 Base) MCG/ACT inhaler Inhale 2 puffs into the lungs every 6 (six) hours as  needed for wheezing or shortness of breath.  . ARIPiprazole (ABILIFY) 30 MG tablet Take 30 mg by mouth daily.   Marland Kitchen aspirin EC 81 MG tablet Take 1 tablet (81 mg total) by mouth daily. Swallow whole.  Marland Kitchen atorvastatin (LIPITOR) 40 MG tablet Take 2 tablets (80 mg total) by mouth daily.  Marland Kitchen dexlansoprazole (DEXILANT) 60 MG capsule Take 1 capsule (60 mg total) by mouth daily. Office visit for further refills  . diclofenac sodium (VOLTAREN) 1 % GEL Apply 2 g topically 4 (four) times daily.   . fexofenadine (ALLEGRA) 180 MG tablet Take 1 tablet (180 mg total) by mouth daily.  Marland Kitchen FLUoxetine (PROZAC) 20 MG capsule Take 20 mg by mouth 2 (two) times daily.   . fluticasone (FLONASE) 50 MCG/ACT nasal spray SPRAY TWO SPRAYS IN EACH NOSTRIL ONCE DAILY  . HYDROcodone-acetaminophen (NORCO/VICODIN) 5-325 MG tablet Take 1 tablet by mouth 5 (five) times daily as needed for moderate pain.   Marland Kitchen lamoTRIgine (LAMICTAL) 100 MG tablet Take 100 mg by mouth 2 (  two) times daily.   Marland Kitchen losartan (COZAAR) 50 MG tablet Take 1 tablet (50 mg total) by mouth daily.  . methocarbamol (ROBAXIN) 500 MG tablet Take 500 mg by mouth 2 (two) times daily as needed for muscle spasms.   . metoprolol tartrate (LOPRESSOR) 25 MG tablet Take 1 tablet (25 mg total) by mouth 2 (two) times daily.  . montelukast (SINGULAIR) 10 MG tablet TAKE ONE TABLET BY MOUTH AT BEDTIME  . Multiple Vitamin (MULTIVITAMIN WITH MINERALS) TABS tablet Take 1 tablet by mouth daily.  Marland Kitchen NARCAN 4 MG/0.1ML LIQD nasal spray kit Place 0.4 mg into the nose once.   . ondansetron (ZOFRAN) 4 MG tablet Take 1 tablet (4 mg total) by mouth every 6 (six) hours as needed for nausea or vomiting.  . promethazine (PHENERGAN) 25 MG suppository Place 1 suppository (25 mg total) rectally every 6 (six) hours as needed for nausea or vomiting.  Marland Kitchen Respiratory Therapy Supplies (FLUTTER) DEVI 3 puffs by Does not apply route 4 (four) times daily as needed. Needs to use device 20 min 3-4 times a day for  coughing  . Tiotropium Bromide Monohydrate (SPIRIVA RESPIMAT) 2.5 MCG/ACT AERS Inhale 2 puffs into the lungs daily.  . traMADol (ULTRAM) 50 MG tablet Take 50 mg by mouth every 8 (eight) hours as needed (break through pain).      Allergies:   Doxycycline   Social History   Tobacco Use  . Smoking status: Former Smoker    Packs/day: 0.25    Years: 46.00    Pack years: 11.50    Types: Cigars, Cigarettes    Quit date: 04/23/2019    Years since quitting: 0.8  . Smokeless tobacco: Never Used  . Tobacco comment: Pt states vaping every day.  stopped vaping.  Vaping Use  . Vaping Use: Never used  Substance Use Topics  . Alcohol use: Yes    Alcohol/week: 1.0 standard drink    Types: 1 Shots of liquor per week    Comment: occ  . Drug use: No     Family Hx: The patient's family history includes Atrial fibrillation in her mother; Colon polyps in her brother; Diabetes in her father; Heart attack in her father; Hyperlipidemia in her father and sister; Hypertension in her father and mother; Irritable bowel syndrome in her mother; Ovarian cancer in her maternal aunt; Thyroid disease in her brother and sister. There is no history of Stomach cancer or Colon cancer.  ROS:   Please see the history of present illness.    All other systems reviewed and are negative.   Prior CV studies:   The following studies were reviewed today:  Coronary CT 01/2020 FINDINGS: CT-FFR analysis was performed on the original cardiac CT angiogram dataset. Diagrammatic representation of the CT-FFR analysis is provided in a separate PDF document in PACS. This dictation was created using the PDF document and an interactive 3D model of the results. 3D model is not available in the EMR/PACS. Normal FFR range is >0.80.  1. Left Main: No significant functional stenosis, CT-FFR 0.99.  2. LAD: Distal vessel functionally significant stenosis, CT-FFR 0.73 distal vessel. 3. LCX: No significant functional stenosis,  CT-FFR 0.92 distal vessel. 4. RCA: No significant functional stenosis, CT-FFR 0.92 in the distal vessel. 5. Ramus Intermedius: No significant functional stenosis, CT-FFR 0.94 in the distal vessel.  IMPRESSION: 1. There is evidence of distal vessel functionally significant stenosis, CT-FFR 0.73 distal vessel of LAD.  Monitor 01/2020  Patient had a minimum heart rate  of 56 bpm, maximum heart rate of 214 bpm, and average heart rate of 84 bpm.  Predominant underlying rhythm was sinus rhythm.  Thirty five runs of supraventricular tachycardia occurred with the longest lasting 19.2 seconds and the fastest with a max rate of 214 bpm.  Isolated PACs were occasional (1.5 %), with rare couplets and triplets present.  Isolated PVCs were rare (<1.0%), with rare couplets present.  No evidence of complete heart block.  Triggered and diary events associated with both sinus rhythm and supraventricular tachycardia.   Symptomatic supraventricular tachycardia.   Labs/Other Tests and Data Reviewed:    EKG:  No ECG reviewed.  Recent Labs: 09/19/2019: TSH 2.94 11/06/2019: ALT 32; Hemoglobin 11.2; Platelets 174.0 02/26/2020: BUN 12; Creatinine, Ser 0.80; Potassium 4.3; Sodium 136   Recent Lipid Panel Lab Results  Component Value Date/Time   CHOL 302 (H) 02/07/2018 10:13 AM   TRIG 165.0 (H) 02/07/2018 10:13 AM   HDL 107.20 02/07/2018 10:13 AM   CHOLHDL 3 02/07/2018 10:13 AM   LDLCALC 162 (H) 02/07/2018 10:13 AM   LDLDIRECT 192.5 06/08/2012 11:06 AM    Wt Readings from Last 3 Encounters:  03/05/20 218 lb (98.9 kg)  01/29/20 224 lb (101.6 kg)  01/03/20 224 lb 9.6 oz (101.9 kg)     Risk Assessment/Calculations:      Objective:    Vital Signs:  BP 134/73   Pulse 72   Ht $R'5\' 5"'fJ$  (1.651 m)   Wt 218 lb (98.9 kg)   BMI 36.28 kg/m    VITAL SIGNS:  reviewed GEN:  no acute distress PSYCH:  normal affect  ASSESSMENT & PLAN:    1. CAD -Coronary CTA result as above.  Positive FFR on  distal LAD.  Plan for medical management.  Continue aspirin, statin and beta-blocker. -Her chest pain episode could be due to elevated heart rate.  She has not started taking medication until few days ago.  Recommended up titration of metoprolol to 37.5 mg twice daily however patient declined currently.  She has some fatigue.  Seems multifactorial.  I have at length discussion regarding importance of medication compliance.  Seems poor insight of her health condition but after education seems willing to try it. -We will bring her in 1 month for close follow-up and up titration of medication.  2. SVT -Symptomatic with SVT episodes. -Continue metoprolol at current dose.  See above. -Advised to avoid caffeine intake  3. Hyperlipidemia -Recommended to continue high intensity statin. -She will need lipid panel and LFTs at follow-up  4. Hypertension -Blood pressure stable and well-controlled on current medication.   COVID-19 Education: The signs and symptoms of COVID-19 were discussed with the patient and how to seek care for testing (follow up with PCP or arrange E-visit).  The importance of social distancing was discussed today.  Time:   Today, I have spent 15  minutes with the patient with telehealth technology discussing the above problems.     Medication Adjustments/Labs and Tests Ordered: Current medicines are reviewed at length with the patient today.  Concerns regarding medicines are outlined above.   Tests Ordered: No orders of the defined types were placed in this encounter.   Medication Changes: No orders of the defined types were placed in this encounter.   Follow Up:  In Person in 4 week(s)  Signed, Leanor Kail, [place]  03/05/2020 9:31 AM    [place]

## 2020-03-05 ENCOUNTER — Telehealth: Payer: Self-pay | Admitting: Internal Medicine

## 2020-03-05 ENCOUNTER — Telehealth (INDEPENDENT_AMBULATORY_CARE_PROVIDER_SITE_OTHER): Payer: PPO | Admitting: Physician Assistant

## 2020-03-05 ENCOUNTER — Telehealth: Payer: Self-pay | Admitting: *Deleted

## 2020-03-05 ENCOUNTER — Other Ambulatory Visit: Payer: Self-pay

## 2020-03-05 ENCOUNTER — Encounter: Payer: Self-pay | Admitting: Physician Assistant

## 2020-03-05 VITALS — BP 134/73 | HR 72 | Ht 65.0 in | Wt 218.0 lb

## 2020-03-05 DIAGNOSIS — I471 Supraventricular tachycardia: Secondary | ICD-10-CM | POA: Diagnosis not present

## 2020-03-05 DIAGNOSIS — I10 Essential (primary) hypertension: Secondary | ICD-10-CM | POA: Diagnosis not present

## 2020-03-05 DIAGNOSIS — I25110 Atherosclerotic heart disease of native coronary artery with unstable angina pectoris: Secondary | ICD-10-CM

## 2020-03-05 DIAGNOSIS — E782 Mixed hyperlipidemia: Secondary | ICD-10-CM

## 2020-03-05 NOTE — Telephone Encounter (Signed)
  Patient Consent for Virtual Visit         [name] [name] has provided verbal consent on 03/05/2020 for a virtual visit (video or telephone).   CONSENT FOR VIRTUAL VISIT FOR:  [name] [name]  By participating in this virtual visit I agree to the following:  I hereby voluntarily request, consent and authorize [facility] and its employed or contracted physicians, physician assistants, nurse practitioners or other licensed health care professionals (the Practitioner), to provide me with telemedicine health care services (the "Services") as deemed necessary by the treating Practitioner. I acknowledge and consent to receive the Services by the Practitioner via telemedicine. I understand that the telemedicine visit will involve communicating with the Practitioner through live audiovisual communication technology and the disclosure of certain medical information by electronic transmission. I acknowledge that I have been given the opportunity to request an in-person assessment or other available alternative prior to the telemedicine visit and am voluntarily participating in the telemedicine visit.  I understand that I have the right to withhold or withdraw my consent to the use of telemedicine in the course of my care at any time, without affecting my right to future care or treatment, and that the Practitioner or I may terminate the telemedicine visit at any time. I understand that I have the right to inspect all information obtained and/or recorded in the course of the telemedicine visit and may receive copies of available information for a reasonable fee.  I understand that some of the potential risks of receiving the Services via telemedicine include:  Marland Kitchen Delay or interruption in medical evaluation due to technological equipment failure or disruption; . Information transmitted may not be sufficient (e.g. poor resolution of images) to allow for appropriate medical decision making by the Practitioner;  and/or  . In rare instances, security protocols could fail, causing a breach of personal health information.  Furthermore, I acknowledge that it is my responsibility to provide information about my medical history, conditions and care that is complete and accurate to the best of my ability. I acknowledge that Practitioner's advice, recommendations, and/or decision may be based on factors not within their control, such as incomplete or inaccurate data provided by me or distortions of diagnostic images or specimens that may result from electronic transmissions. I understand that the practice of medicine is not an exact science and that Practitioner makes no warranties or guarantees regarding treatment outcomes. I acknowledge that a copy of this consent can be made available to me via my patient portal ([facility]), or I can request a printed copy by calling the office of [place].    I understand that my insurance will be billed for this visit.   I have read or had this consent read to me. . I understand the contents of this consent, which adequately explains the benefits and risks of the Services being provided via telemedicine.  . I have been provided ample opportunity to ask questions regarding this consent and the Services and have had my questions answered to my satisfaction. . I give my informed consent for the services to be provided through the use of telemedicine in my medical care

## 2020-03-05 NOTE — Patient Instructions (Signed)
Medication Instructions:  Your physician recommends that you continue on your current medications as directed. Please refer to the Current Medication list given to you today.  *If you need a refill on your cardiac medications before your next appointment, please call your pharmacy*   Lab Work: None ordered  If you have labs (blood work) drawn today and your tests are completely normal, you will receive your results only by: Marland Kitchen MyChart Message (if you have MyChart) OR . A paper copy in the mail If you have any lab test that is abnormal or we need to change your treatment, we will call you to review the results.   Testing/Procedures: None ordered   Follow-Up: At [facility], you and your health needs are our priority.  As part of our continuing mission to provide you with exceptional heart care, we have created designated Provider Care Teams.  These Care Teams include your primary Cardiologist (physician) and Advanced Practice Providers (APPs -  Physician Assistants and Nurse Practitioners) who all work together to provide you with the care you need, when you need it.  We recommend signing up for the patient portal called "MyChart".  Sign up information is provided on this After Visit Summary.  MyChart is used to connect with patients for Virtual Visits (Telemedicine).  Patients are able to view lab/test results, encounter notes, upcoming appointments, etc.  Non-urgent messages can be sent to your provider as well.   To learn more about what you can do with MyChart, go to [email or web address].    Your next appointment:   1 month(s)   04/03/2020 ARRIVE AT 1:00   The format for your next appointment:   In Person  Provider:   Rudean Haskell, MD   Other Instructions

## 2020-03-05 NOTE — Telephone Encounter (Signed)
Patient calling in to request that someone go over what was stated in her appointment this morning with Vin Bhagat. She states she thinks she heard him say something about a 40% blockage and wants to double check. She tried reading over the summary in [place] and states she doesn't understand what some of it means. Please call/advise  Thank you!

## 2020-03-05 NOTE — Telephone Encounter (Signed)
I spoke with the patient and went over Coronary Ct result in detail again. She has no more questions and appreciative of call.

## 2020-03-06 ENCOUNTER — Ambulatory Visit (INDEPENDENT_AMBULATORY_CARE_PROVIDER_SITE_OTHER): Payer: PPO | Admitting: Internal Medicine

## 2020-03-06 ENCOUNTER — Other Ambulatory Visit: Payer: Self-pay

## 2020-03-06 ENCOUNTER — Encounter: Payer: Self-pay | Admitting: Internal Medicine

## 2020-03-06 VITALS — BP 124/78 | HR 67 | Temp 98.3°F | Ht 65.0 in | Wt 231.0 lb

## 2020-03-06 DIAGNOSIS — R6889 Other general symptoms and signs: Secondary | ICD-10-CM | POA: Insufficient documentation

## 2020-03-06 DIAGNOSIS — R739 Hyperglycemia, unspecified: Secondary | ICD-10-CM | POA: Diagnosis not present

## 2020-03-06 DIAGNOSIS — I10 Essential (primary) hypertension: Secondary | ICD-10-CM

## 2020-03-06 DIAGNOSIS — D539 Nutritional anemia, unspecified: Secondary | ICD-10-CM | POA: Diagnosis not present

## 2020-03-06 DIAGNOSIS — D520 Dietary folate deficiency anemia: Secondary | ICD-10-CM

## 2020-03-06 NOTE — Progress Notes (Signed)
Subjective:  Patient ID:  [name] [name], female    DOB: [birth year]  Age: 60 y.o. MRN: [id number]  CC: Anemia  This visit occurred during the SARS-CoV-2 public health emergency.  Safety protocols were in place, including screening questions prior to the visit, additional usage of staff PPE, and extensive cleaning of exam room while observing appropriate contact time as indicated for disinfecting solutions.   NEW TO ME  HPI [name] [name] presents for f/up - I was asked to see her today urgently for facial swelling.  She tells me that she received a vaccine about a week ago and now has noticed bilateral facial swelling, more on the left than the right, and she was concerned that it might be related to the vaccine.  It is difficult to get a history from her but it sounds like over the last few months she has received multiple courses of systemic steroids to treat arthritis and lung disease.  She tells me the swelling in her face causes some tightness on the left side but there is no discomfort on the right side.  She denies rash, itching, difficulty breathing or swallowing, coughing, wheezing, or shortness of breath.  Outpatient Medications Prior to Visit  Medication Sig Dispense Refill   albuterol (VENTOLIN HFA) 108 (90 Base) MCG/ACT inhaler Inhale 2 puffs into the lungs every 6 (six) hours as needed for wheezing or shortness of breath. 18 g 6   ARIPiprazole (ABILIFY) 30 MG tablet Take 30 mg by mouth daily.      aspirin EC 81 MG tablet Take 1 tablet (81 mg total) by mouth daily. Swallow whole. 90 tablet 3   atorvastatin (LIPITOR) 40 MG tablet Take 2 tablets (80 mg total) by mouth daily. 180 tablet 3   dexlansoprazole (DEXILANT) 60 MG capsule Take 1 capsule (60 mg total) by mouth daily. Office visit for further refills 30 capsule 2   diclofenac sodium (VOLTAREN) 1 % GEL Apply 2 g topically 4 (four) times daily.      fexofenadine (ALLEGRA) 180 MG tablet Take 1 tablet (180 mg total) by  mouth daily. 30 tablet 11   FLUoxetine (PROZAC) 20 MG capsule Take 20 mg by mouth 2 (two) times daily.      fluticasone (FLONASE) 50 MCG/ACT nasal spray SPRAY TWO SPRAYS IN EACH NOSTRIL ONCE DAILY 48 g 2   HYDROcodone-acetaminophen (NORCO/VICODIN) 5-325 MG tablet Take 1 tablet by mouth 5 (five) times daily as needed for moderate pain.      lamoTRIgine (LAMICTAL) 100 MG tablet Take 100 mg by mouth 2 (two) times daily.      losartan (COZAAR) 50 MG tablet Take 1 tablet (50 mg total) by mouth daily. 90 tablet 1   methocarbamol (ROBAXIN) 500 MG tablet Take 500 mg by mouth 2 (two) times daily as needed for muscle spasms.      metoprolol tartrate (LOPRESSOR) 25 MG tablet Take 1 tablet (25 mg total) by mouth 2 (two) times daily. 180 tablet 2   montelukast (SINGULAIR) 10 MG tablet TAKE ONE TABLET BY MOUTH AT BEDTIME 90 tablet 2   Multiple Vitamin (MULTIVITAMIN WITH MINERALS) TABS tablet Take 1 tablet by mouth daily. 30 tablet 0   NARCAN 4 MG/0.1ML LIQD nasal spray kit Place 0.4 mg into the nose once.      ondansetron (ZOFRAN) 4 MG tablet Take 1 tablet (4 mg total) by mouth every 6 (six) hours as needed for nausea or vomiting. 30 tablet 0   promethazine (  PHENERGAN) 25 MG suppository Place 1 suppository (25 mg total) rectally every 6 (six) hours as needed for nausea or vomiting. 30 each 0   Respiratory Therapy Supplies (FLUTTER) DEVI 3 puffs by Does not apply route 4 (four) times daily as needed. Needs to use device 20 min 3-4 times a day for coughing 1 each 0   Tiotropium Bromide Monohydrate (SPIRIVA RESPIMAT) 2.5 MCG/ACT AERS Inhale 2 puffs into the lungs daily. 4 g 11   traMADol (ULTRAM) 50 MG tablet Take 50 mg by mouth every 8 (eight) hours as needed (break through pain).      No facility-administered medications prior to visit.    ROS Review of Systems  Constitutional: Positive for fatigue and unexpected weight change (wt gain). Negative for appetite change, chills, diaphoresis and  fever.  HENT: Positive for facial swelling. Negative for sinus pressure, sore throat, trouble swallowing and voice change.   Eyes: Negative.   Respiratory: Negative for cough, chest tightness, shortness of breath and wheezing.   Cardiovascular: Negative for chest pain, palpitations and leg swelling.  Gastrointestinal: Negative for abdominal pain, constipation, diarrhea, nausea and vomiting.  Endocrine: Negative.   Genitourinary: Negative.  Negative for difficulty urinating.  Musculoskeletal: Positive for arthralgias. Negative for back pain, myalgias and neck pain.  Skin: Positive for pallor. Negative for rash.  Hematological: Negative for adenopathy. Does not bruise/bleed easily.  Psychiatric/Behavioral: Positive for dysphoric mood. Negative for self-injury, sleep disturbance and suicidal ideas. The patient is not nervous/anxious.     Objective:  BP 124/78    Pulse 67    Temp 98.3 F (36.8 C) (Oral)    Ht 5' 5" (1.651 m)    Wt 231 lb (104.8 kg)    SpO2 97%    BMI 38.44 kg/m   BP Readings from Last 3 Encounters:  03/06/20 124/78  03/05/20 134/73  02/06/20 134/89    Wt Readings from Last 3 Encounters:  03/06/20 231 lb (104.8 kg)  03/05/20 218 lb (98.9 kg)  01/29/20 224 lb (101.6 kg)    Physical Exam Vitals reviewed.  Constitutional:      General: She is not in acute distress.    Appearance: She is obese. She is ill-appearing (facial plethora). She is not toxic-appearing or diaphoretic.  HENT:     Nose: Nose normal.     Mouth/Throat:     Mouth: Mucous membranes are moist.  Eyes:     General: No scleral icterus.    Conjunctiva/sclera: Conjunctivae normal.  Cardiovascular:     Rate and Rhythm: Normal rate.     Heart sounds: Murmur heard.   Systolic murmur is present with a grade of 1/6.  No diastolic murmur is present. No gallop.   Pulmonary:     Effort: Pulmonary effort is normal.     Breath sounds: No stridor. No wheezing, rhonchi or rales.  Abdominal:     General:  Abdomen is protuberant.     Palpations: There is no mass.     Tenderness: There is no abdominal tenderness. There is no guarding.  Musculoskeletal:        General: Normal range of motion.     Cervical back: Neck supple.     Right lower leg: No edema.     Left lower leg: No edema.  Lymphadenopathy:     Cervical: No cervical adenopathy.  Skin:    General: Skin is warm and dry.     Findings: No rash.  Neurological:     General: No focal  deficit present.  Psychiatric:        Mood and Affect: Mood normal.        Behavior: Behavior normal.     Lab Results  Component Value Date   WBC 4.1 03/07/2020   HGB 10.4 (L) 03/07/2020   HCT 30.9 (L) 03/07/2020   PLT 147.0 (L) 03/07/2020   GLUCOSE 129 (H) 03/07/2020   CHOL 302 (H) 02/07/2018   TRIG 165.0 (H) 02/07/2018   HDL 107.20 02/07/2018   LDLDIRECT 192.5 06/08/2012   LDLCALC 162 (H) 02/07/2018   ALT 32 11/06/2019   AST 58 (H) 11/06/2019   NA 132 (L) 03/07/2020   K 4.6 03/07/2020   CL 97 03/07/2020   CREATININE 0.89 03/07/2020   BUN 7 03/07/2020   CO2 26 03/07/2020   TSH 2.75 03/07/2020   INR 1.1 (H) 11/06/2019   HGBA1C 5.9 03/07/2020    CT CORONARY MORPH W/CTA COR W/SCORE W/CA W/CM &/OR WO/CM  Addendum Date: 02/07/2020   ADDENDUM REPORT: 02/07/2020 11:32 CLINICAL DATA:  60 Year-old White Female EXAM: Cardiac/Coronary CTA TECHNIQUE: The patient was scanned on a [employer]. FINDINGS: A 100 kV prospective scan was triggered in the descending thoracic aorta at 111 HU's. Axial non-contrast 3 mm slices were carried out through the heart. The data set was analyzed on a dedicated work station and scored using the [place]. Gantry rotation speed was 250 msecs and collimation was .6 mm. No beta blockade and 0.8 mg of sl NTG was given. The 3D data set was reconstructed in 5% intervals of the 67-82 % of the R-R cycle. Diastolic phases were analyzed on a dedicated work station using MPR, MIP and VRT modes. The patient  received 80 cc of contrast. Aorta:  Normal size.  Aortic atherosclerosis noted.  No dissection. Aortic Valve:  Tri-leaflet. No calcifications. Coronary Arteries:  Normal coronary origin.  Right dominance. Coronary calcium score of 159. This was 94th percentile for age, sex, and race matched control. RCA is a large dominant artery that gives rise to PDA and PLA. There are tandem lesions; a minimal non-obstructive(1-24%) calcified plaque and a mild non-obstructive (25-49%) calcified plaque in the proximal vessel. Left main is a large artery that gives rise to LAD and LCX arteries. LAD is a large vessel that gives rise to one small D1 vessel. There are tandem lesions; a mild non-obstructive (25-49%) calcified plaque and a second mild non-obstructive (25-49%) calcified plaque in the proximal vessel. LCX is a non-dominant artery that gives rise to one large OM1 branch. There is a mild non-obstructive (25-49%) mixed plaque (low attenuation and calcified plaque) in the proximal vessel. There is a ramus intermedius vessel with minimal non-obstructive (1-24%) soft plaque in the mid body. Other findings: Left atrial enlargement noted. Normal pulmonary vein drainage into the left atrium. Normal left atrial appendage without a thrombus. Normal size of the pulmonary artery. Extra-cardiac findings: See attached radiology report for non-cardiac structures. IMPRESSION: 1. Coronary calcium score of 159. This was 94th percentile for age, sex, and race matched control. 2. Normal coronary origin with right dominance. 3. Aortic atherosclerosis noted. 4. CAD-RADS 2. Mild non-obstructive disease. Consider symptom-guided anti-ischemic pharmacotherapy as well as risk factor modification per guideline directed care. Additional analysis with CT FFR will be submitted because of mixed plaque. 4.  Left atrial enlargement noted. Electronically Signed   By: Rudean Haskell MD   On: 02/07/2020 11:32   Result Date: 02/07/2020 EXAM:  OVER-READ INTERPRETATION  CT CHEST The  following report is an over-read performed by radiologist Dr. Lorin Picket of [facility], [place] on 02/06/2020. This over-read does not include interpretation of cardiac or coronary anatomy or pathology. The coronary calcium score/coronary CTA interpretation by the cardiologist is attached. COMPARISON:  04/04/2019. FINDINGS: Vascular: Heart is enlarged.  No pericardial effusion. Mediastinum/Nodes: None. Lungs/Pleura: Millimetric nodules along the minor fissure are indicative of subpleural lymph nodes. Minimal dependent atelectasis bilaterally. No pleural fluid. Visualized airway is unremarkable. Upper Abdomen: Visualized portion of the liver appears decreased in attenuation diffusely. Musculoskeletal: Mild degenerative changes in the spine. IMPRESSION: Hepatic steatosis. Electronically Signed: By: Lorin Picket M.D. On: 02/06/2020 11:38   CT CORONARY FRACTIONAL FLOW RESERVE DATA PREP  Result Date: 02/07/2020 EXAM: CT-FFR ANALYSIS CLINICAL DATA:  Mixed plaque in the circumflex vessel FINDINGS: CT-FFR analysis was performed on the original cardiac CT angiogram dataset. Diagrammatic representation of the CT-FFR analysis is provided in a separate PDF document in PACS. This dictation was created using the PDF document and an interactive 3D model of the results. 3D model is not available in the EMR/PACS. Normal FFR range is >0.80. 1. Left Main: No significant functional stenosis, CT-FFR 0.99. 2. LAD: Distal vessel functionally significant stenosis, CT-FFR 0.73 distal vessel. 3. LCX: No significant functional stenosis, CT-FFR 0.92 distal vessel. 4. RCA: No significant functional stenosis, CT-FFR 0.92 in the distal vessel. 5. Ramus Intermedius: No significant functional stenosis, CT-FFR 0.94 in the distal vessel. IMPRESSION: 1. There is evidence of distal vessel functionally significant stenosis, CT-FFR 0.73 distal vessel of LAD. Rudean Haskell MD Electronically  Signed   By: Rudean Haskell MD   On: 02/07/2020 11:36    Assessment & Plan:   Nura was seen today for anemia.  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  I think this is caused by the recent systemic steroids.  Her cortisol level is low so I am concerned she has developed iatrogenic adrenal insufficiency.  I have asked her to see endocrinology as soon as possible. -     ACTH; Future -     Cortisol; Future -     Basic metabolic panel; Future -     Ambulatory referral to Endocrinology  Deficiency anemia- I will screen her for vitamin deficiencies. -     Vitamin B12; Future -     CBC with Differential/Platelet; Future -     Vitamin B1; Future -     Iron; Future -     Folate; Future -     Ferritin; Future  Hyperglycemia -     ACTH; Future -     Cortisol; Future -     Basic metabolic panel; Future -     Hemoglobin A1c; Future  Essential hypertension- Her blood pressure is well controlled. -     TSH; Future  Dietary folate deficiency anemia -     folic acid (FOLVITE) 1 MG tablet; Take 1 tablet (1 mg total) by mouth daily.   I am having  [name] [name] start on folic acid. I am also having her maintain her lamoTRIgine, multivitamin with minerals, HYDROcodone-acetaminophen, diclofenac sodium, methocarbamol, FLUoxetine, Narcan, ARIPiprazole, traMADol, Flutter, Dexilant, fluticasone, fexofenadine, losartan, montelukast, ondansetron, albuterol, Spiriva Respimat, promethazine, aspirin EC, metoprolol tartrate, and atorvastatin.  Meds ordered this encounter  Medications   folic acid (FOLVITE) 1 MG tablet    Sig: Take 1 tablet (1 mg total) by mouth daily.  Dispense:  90 tablet    Refill:  1     Follow-up: Return in about 1 week (around 03/13/2020).  Scarlette Calico, MD

## 2020-03-06 NOTE — Patient Instructions (Signed)
Cushing's Syndrome  Cushing syndrome is a condition in which the body produces too much cortisol. Cortisol is a chemical messenger, or hormone, made by the two glands at the top of the kidneys (adrenal glands). The adrenal glands make cortisol when a small organ in the brain (pituitary gland) releases a chemical known as adrenocorticotropic hormone (ACTH) that stimulates the adrenal glands. Cortisol is sometimes called "the stress hormone" because the adrenal glands release it during times of stress. It also regulates the body's disease-fighting system, or immune system, and how the body uses energy. What are the causes? The most common cause of Cushing syndrome is taking steroids for a long time. Steroids are anti-inflammatory medicines that work in the same way as cortisol in the body. Other causes include tumors in different parts of the body, such as:  Adrenal glands.  Pituitary gland.  Lungs.  Pancreas.  Thyroid.  Thymus. The tumors that cause Cushing syndrome usually are not cancerous (are benign). What increases the risk? The following factors make you more likely to develop this condition:  Being a woman. Women are more likely to develop pituitary or adrenal gland tumors.  Being 19-16 years old. What are the signs or symptoms? Symptoms of this condition vary widely. Many possible symptoms of Cushing syndrome can occur because cortisol affects so many parts of the body. Some people have only mild symptoms, and others may have very severe symptoms. Common symptoms of this condition include:  Weight gain.  High blood pressure.  Headache.  Memory loss.  Round face (moon face) or fat around the neck, especially in the back of the neck (buffalo hump).  Irritability or tiredness.  Menstrual changes, in women. Other symptoms may include:  Skin changes, such as acne, bruising, infections, or stretch marks.  Hirsutism (in women). This is abnormal facial hair  growth.  Sexual dysfunction (in men). This is loss of sexual function.  Muscle weakness.  Insomnia. This is trouble falling asleep or staying asleep.  Depression.  Osteopenia. This is loss of bone mass that can lead to bone breaks. How is this diagnosed? This condition may be diagnosed based on:  Your medical history and a physical exam.  Blood tests.  Urine tests. Your health care provider may measure the amount of cortisol in your urine within 24 hours.  Saliva tests.  Dexamethasone suppression test. For this test, you will receive a medicine similar to cortisol to see whether your natural cortisol level decreases. Cushing syndrome is hard to diagnose because many other conditions cause similar symptoms. If you are diagnosed with Cushing syndrome, you may have more tests to find the cause. How is this treated? Treatment for this condition depends on the cause.  If steroid medicines are the cause, your health care provider will gradually stop your dosage.  If a tumor is the cause, treatment may include: ? Surgery to remove the tumor. ? X-ray treatment (radiation therapy) to destroy a tumor that cannot be completely removed. ? Medicine to stop tumor growth and decrease the amount of ACTH produced by the pituitary gland. ? Medicines to block the effects of cortisol. ? Surgery to remove the adrenal glands. This is rare. If your treatment affected your pituitary gland, you may need to take hormones (hormone replacement therapy) after treatment. Follow these instructions at home: Medicines  Take over-the-counter and prescription medicines only as told by your health care provider.  Take hormone replacement medicines daily as told. It is important to not skip a dose of  this medicine. Alcohol use  Do not drink alcohol if: ? Your health care provider tells you not to drink. ? You are pregnant, may be pregnant, or are planning to become pregnant.  If you drink  alcohol: ? Limit how much you have to:  0-1 drink a day for women.  0-2 drinks a day for men. ? Know how much alcohol is in your drink. In the [place] one drink equals one 12 oz bottle of beer (355 mL), one 5 oz glass of wine (148 mL), or one 1 oz glass of hard liquor (44 mL). Lifestyle Because this condition can make your bones weak, follow the guidance below to prevent bone loss or damage as told by your health care provider:  Eat a healthy diet. Eat foods that have calcium and vitamin D, such as dairy products with calcium and vitamin D added (fortified dairy products).  Get regular exercise as told by your health care provider.  Do not do activities that involve a lot of running or jumping (high-impact activities). Ask your health care provider what activities are safe for you.  Do not use any products that contain nicotine or tobacco. These products include cigarettes, chewing tobacco, and vaping devices, such as e-cigarettes. If you need help quitting, ask your health care provider.      General instructions  If your adrenal glands were removed, talk with your health care provider about how to manage symptoms when you are sick or under stress. You may need to take additional medicines during this time.  Keep all follow-up visits. This is important. Contact a health care provider if:  You have a fever or chills.  You are struggling to manage your symptoms. Get help right away if:  You feel very light-headed or weak, or you faint.  You have severe pain in your abdomen.  You have sudden changes in your vision.  You become very confused.  You have trouble breathing.  You have chest pain. These symptoms may represent a serious problem that is an emergency. Do not wait to see if the symptoms will go away. Get medical help right away. Call your local emergency services (911 in the [place] Do not drive yourself to the hospital. Summary  Cushing syndrome is a condition in  which the body produces too much cortisol, a hormone that regulates the body's disease-fighting system (immune system) and how the body uses energy.  The most common cause of this condition is long-term use of steroids, but it can also be caused by certain tumors.  Cushing syndrome is treated by gradually stopping the use of steroids, if steroid medicines are the cause. Because this condition can make your bones weak, work with your health care provider to take steps to prevent bone loss or damage.  If you have Cushing syndrome, you should get help right away if you feel light-headed, have severe pain in your abdomen, or have sudden changes in your vision. This information is not intended to replace advice given to you by your health care provider. Make sure you discuss any questions you have with your health care provider. Document Revised: 09/19/2019 Document Reviewed: 09/19/2019 Elsevier Patient Education  [employer].

## 2020-03-07 ENCOUNTER — Encounter: Payer: Self-pay | Admitting: Internal Medicine

## 2020-03-07 ENCOUNTER — Other Ambulatory Visit (INDEPENDENT_AMBULATORY_CARE_PROVIDER_SITE_OTHER): Payer: PPO

## 2020-03-07 ENCOUNTER — Telehealth: Payer: Self-pay | Admitting: Family

## 2020-03-07 DIAGNOSIS — D539 Nutritional anemia, unspecified: Secondary | ICD-10-CM

## 2020-03-07 DIAGNOSIS — I10 Essential (primary) hypertension: Secondary | ICD-10-CM | POA: Diagnosis not present

## 2020-03-07 DIAGNOSIS — R739 Hyperglycemia, unspecified: Secondary | ICD-10-CM

## 2020-03-07 DIAGNOSIS — R6889 Other general symptoms and signs: Secondary | ICD-10-CM | POA: Diagnosis not present

## 2020-03-07 LAB — BASIC METABOLIC PANEL
BUN: 7 mg/dL (ref 6–23)
CO2: 26 mEq/L (ref 19–32)
Calcium: 9 mg/dL (ref 8.4–10.5)
Chloride: 97 mEq/L (ref 96–112)
Creatinine, Ser: 0.89 mg/dL (ref 0.40–1.20)
GFR: 71.12 mL/min (ref >60.00)
Glucose, Bld: 129 mg/dL — ABNORMAL HIGH (ref 70–99)
Potassium: 4.6 mEq/L (ref 3.5–5.1)
Sodium: 132 mEq/L — ABNORMAL LOW (ref 135–145)

## 2020-03-07 LAB — CBC WITH DIFFERENTIAL/PLATELET
Basophils Absolute: 0 10*3/uL (ref 0.0–0.1)
Basophils Relative: 1 % (ref 0.0–3.0)
Eosinophils Absolute: 0.1 10*3/uL (ref 0.0–0.7)
Eosinophils Relative: 2.9 % (ref 0.0–5.0)
HCT: 30.9 % — ABNORMAL LOW (ref 36.0–46.0)
Hemoglobin: 10.4 g/dL — ABNORMAL LOW (ref 12.0–15.0)
Lymphocytes Relative: 36.1 % (ref 12.0–46.0)
Lymphs Abs: 1.5 10*3/uL (ref 0.7–4.0)
MCHC: 33.6 g/dL (ref 30.0–36.0)
MCV: 103.1 fl — ABNORMAL HIGH (ref 78.0–100.0)
Monocytes Absolute: 0.5 10*3/uL (ref 0.1–1.0)
Monocytes Relative: 11.9 % (ref 3.0–12.0)
Neutro Abs: 2 10*3/uL (ref 1.4–7.7)
Neutrophils Relative %: 48.1 % (ref 43.0–77.0)
Platelets: 147 10*3/uL — ABNORMAL LOW (ref 150.0–400.0)
RBC: 3 Mil/uL — ABNORMAL LOW (ref 3.87–5.11)
RDW: 17.8 % — ABNORMAL HIGH (ref 11.5–15.5)
WBC: 4.1 10*3/uL (ref 4.0–10.5)

## 2020-03-07 LAB — VITAMIN B12: Vitamin B-12: 379 pg/mL (ref 211–911)

## 2020-03-07 LAB — CORTISOL: Cortisol, Plasma: 1.9 ug/dL

## 2020-03-07 LAB — TSH: TSH: 2.75 u[IU]/mL (ref 0.35–4.50)

## 2020-03-07 LAB — FOLATE: Folate: 3.6 ng/mL — ABNORMAL LOW (ref >5.9)

## 2020-03-07 LAB — IRON: Iron: 117 ug/dL (ref 42–145)

## 2020-03-07 LAB — HEMOGLOBIN A1C: Hgb A1c MFr Bld: 5.9 % (ref 4.6–6.5)

## 2020-03-07 LAB — FERRITIN: Ferritin: 57.3 ng/mL (ref 10.0–291.0)

## 2020-03-07 MED ORDER — FOLIC ACID 1 MG PO TABS: 1.0000 mg | ORAL_TABLET | Freq: Every day | ORAL | 1 refills | Status: AC

## 2020-03-07 NOTE — Telephone Encounter (Signed)
[facility] FYI  Caller states her left face is swollen/tightness after receiving COVID booster shot on 02/28/19. S/S of face swelling started the very next day. No tongue swelling or SOB. Sore on side of tongue, not sure what it is. s/p fluid removed from knee and had cortisone shot, mid December. - Vaccine given at pharmacy. She called them about reaction.  [facility] advised: Go to ED now   Triage called the back and notified office   Patient was advised by the RN to report it to her pharmacist and patient understood.

## 2020-03-07 NOTE — Telephone Encounter (Signed)
Patient wanted to discuss with someone her results from her labs from yesterday ordered by Dr. Ronnald Ramp.

## 2020-03-10 ENCOUNTER — Other Ambulatory Visit: Payer: Self-pay | Admitting: Internal Medicine

## 2020-03-12 ENCOUNTER — Other Ambulatory Visit: Payer: Self-pay

## 2020-03-12 ENCOUNTER — Other Ambulatory Visit: Payer: Self-pay | Admitting: Internal Medicine

## 2020-03-12 DIAGNOSIS — D538 Other specified nutritional anemias: Secondary | ICD-10-CM

## 2020-03-12 DIAGNOSIS — E519 Thiamine deficiency, unspecified: Secondary | ICD-10-CM | POA: Insufficient documentation

## 2020-03-12 LAB — VITAMIN B1: Vitamin B1 (Thiamine): 6 nmol/L — ABNORMAL LOW (ref 8–30)

## 2020-03-12 LAB — ACTH: C206 ACTH: 14 pg/mL (ref 6–50)

## 2020-03-12 MED ORDER — MESALAMINE 4 G RE ENEM: 4.0000 g | ENEMA | Freq: Every day | RECTAL | 30 refills | Status: DC

## 2020-03-12 MED ORDER — VITAMIN B-1 50 MG PO TABS: 50.0000 mg | ORAL_TABLET | Freq: Every day | ORAL | 1 refills | Status: AC

## 2020-03-12 NOTE — Telephone Encounter (Signed)
Called pt, LVM.   

## 2020-03-13 ENCOUNTER — Telehealth: Payer: Self-pay | Admitting: Family

## 2020-03-13 NOTE — Telephone Encounter (Signed)
Per your labs, pt needs the additional supplements.  Pt wanted to know if a multivitamin would have the amount of b1 and folic acid she needs.

## 2020-03-13 NOTE — Telephone Encounter (Signed)
Pt contacted and informed the multivitamin would not have enough folic acid and thiamine in it for the amount that is recommended.   Pt stated understanding and will pick up the recommended medication.

## 2020-03-13 NOTE — Telephone Encounter (Signed)
NO

## 2020-03-13 NOTE — Telephone Encounter (Signed)
Patient called and was asking if she could take a multivitamin with thiamine (VITAMIN B-1) 50 MG tablet and folic acid (FOLVITE) 1 MG tablet. She was also wondering if she could eat [employer] Cereal. Please call the patient back at [phone].

## 2020-03-17 ENCOUNTER — Telehealth: Payer: Self-pay | Admitting: Family

## 2020-03-17 ENCOUNTER — Encounter: Payer: Self-pay | Admitting: Family

## 2020-03-17 ENCOUNTER — Other Ambulatory Visit: Payer: Self-pay

## 2020-03-17 NOTE — Telephone Encounter (Signed)
Patient was seen on 01.13.22 by Dr. Ronnald Ramp and states her tongue is red and she read it can be related to her B12, she wants to know if she needs B12. Patient denies appointment at this time to be evaluated for this.

## 2020-03-17 NOTE — Telephone Encounter (Signed)
error 

## 2020-03-18 DIAGNOSIS — G894 Chronic pain syndrome: Secondary | ICD-10-CM | POA: Diagnosis not present

## 2020-03-18 DIAGNOSIS — M7060 Trochanteric bursitis, unspecified hip: Secondary | ICD-10-CM | POA: Diagnosis not present

## 2020-03-18 DIAGNOSIS — M5136 Other intervertebral disc degeneration, lumbar region: Secondary | ICD-10-CM | POA: Diagnosis not present

## 2020-03-18 DIAGNOSIS — M179 Osteoarthritis of knee, unspecified: Secondary | ICD-10-CM | POA: Diagnosis not present

## 2020-03-18 NOTE — Telephone Encounter (Signed)
I would send this message to Dr. Ronnald Ramp. He saw her last- maybe they had a discussion about the color of her tongue. If he can't offer any insight, then she can just keep her appointment for tomorrow.

## 2020-03-19 ENCOUNTER — Ambulatory Visit: Payer: PPO | Admitting: Family

## 2020-03-19 ENCOUNTER — Ambulatory Visit: Payer: PPO | Admitting: Internal Medicine

## 2020-03-20 ENCOUNTER — Ambulatory Visit: Payer: PPO | Admitting: Internal Medicine

## 2020-03-20 DIAGNOSIS — M1712 Unilateral primary osteoarthritis, left knee: Secondary | ICD-10-CM | POA: Diagnosis not present

## 2020-03-20 DIAGNOSIS — M5459 Other low back pain: Secondary | ICD-10-CM | POA: Diagnosis not present

## 2020-03-21 ENCOUNTER — Ambulatory Visit: Payer: PPO | Admitting: Endocrinology

## 2020-03-26 ENCOUNTER — Other Ambulatory Visit: Payer: Self-pay | Admitting: Nurse Practitioner

## 2020-03-26 DIAGNOSIS — K745 Biliary cirrhosis, unspecified: Secondary | ICD-10-CM

## 2020-03-28 ENCOUNTER — Telehealth: Payer: Self-pay | Admitting: *Deleted

## 2020-03-28 NOTE — Telephone Encounter (Signed)
   Primary Cardiologist: Werner Lean, MD  Chart reviewed as part of pre-operative protocol coverage.  She has an upcoming visit with Dr. Gasper Sells 04/03/20. Would be reasonable to address her preop status at that time.   "Preop" has been added to the appointment notes so the provider is aware. I will route to Dr. Gasper Sells as an Juluis Rainier.  I will emove from pre-op pool at this time.    Abigail Butts, PA-C 03/28/2020, 5:04 PM

## 2020-03-28 NOTE — Telephone Encounter (Signed)
    [facility] Pre-operative Risk Assessment    HEARTCARE STAFF: - Please ensure there is not already an duplicate clearance open for this procedure. - Under Visit Info/Reason for Call, type in Other and utilize the format Clearance MM/DD/YY or Clearance TBD. Do not use dashes or single digits. - If request is for dental extraction, please clarify the # of teeth to be extracted.  Request for surgical clearance:  1. What type of surgery is being performed? LEFT TOTAL KNEE ARTHROPLASTY   2. When is this surgery scheduled? 05/26/20   3. What type of clearance is required (medical clearance vs. Pharmacy clearance to hold med vs. Both)? MEDICAL  4. Are there any medications that need to be held prior to surgery and how long? ASA    5. Practice name and name of physician performing surgery? [facility]; DR. FRANK ALUISIO   6. What is the office phone number? [phone]   7.   What is the office fax number? [phone]  8.   Anesthesia type (None, local, MAC, general) ? CHOICE   Julaine Hua 03/28/2020, 12:51 PM  _________________________________________________________________   (provider comments below)

## 2020-03-29 ENCOUNTER — Telehealth (INDEPENDENT_AMBULATORY_CARE_PROVIDER_SITE_OTHER): Payer: PPO | Admitting: Family Medicine

## 2020-03-29 ENCOUNTER — Encounter: Payer: Self-pay | Admitting: Family Medicine

## 2020-03-29 ENCOUNTER — Other Ambulatory Visit: Payer: Self-pay

## 2020-03-29 DIAGNOSIS — B370 Candidal stomatitis: Secondary | ICD-10-CM

## 2020-03-29 DIAGNOSIS — J029 Acute pharyngitis, unspecified: Secondary | ICD-10-CM

## 2020-03-29 MED ORDER — AMOXICILLIN 500 MG PO CAPS: 1000.0000 mg | ORAL_CAPSULE | Freq: Every day | ORAL | 0 refills | Status: AC

## 2020-03-29 MED ORDER — MAGIC MOUTHWASH W/LIDOCAINE: 5.0000 mL | Freq: Four times a day (QID) | ORAL | 0 refills | Status: DC

## 2020-03-29 NOTE — Progress Notes (Addendum)
Virtual Visit via Video Note  I connected with [name] [name]  on 03/29/20 at 10:20 AM EST by a video enabled telemedicine application and verified that I am speaking with the correct person using two identifiers.  Location patient: home Location provider: [place], [place] [address] Persons participating in the virtual visit: patient, provider  I discussed the limitations of evaluation and management by telemedicine and the availability of in person appointments. The patient expressed understanding and agreed to proceed.   [name] [name] DOB: 1961-02-09 Encounter date: 03/29/2020  This is a 60 y.o. female who presents with Chief Complaint  Patient presents with  . Sore Throat    X1 week, notices white and red spots along her tongue, tried salt water gargles and Chloraseptic with no relief    History of present illness: Really bad sore throat. Getting hoarse if she talks even for a little while. Has places on tongue that are red in the middle and with white around it. Like little donuts - one more brown in center; swollen on "outside of donut". Those are over tongue - two on left, one on right and one near tip of tongue. Throat is red; had some white in back of throat; looks a little better now. Not sure if glands are swollen. Has been on a few rounds of prednisone for resp sx; feels this has caused quick weight gain. Does have hx of thrush after antibiotics/prednisone. Improved with liquid rx.   Not had any fevers - highest temp under blankets was 99.1.   No other sick sx - no cough, congestion, nasal drainage.   Has been taking allegra during day. Not using albuterol much. Taking singulair at night.   No sick contacts.   Having hard time with taking pills/swallowing pills due to sore throat.   Allergies  Allergen Reactions  . Doxycycline     Diarrhea/ nausea   Current Meds  Medication Sig  . albuterol (VENTOLIN HFA) 108 (90 Base)  MCG/ACT inhaler Inhale 2 puffs into the lungs every 6 (six) hours as needed for wheezing or shortness of breath.  . ARIPiprazole (ABILIFY) 30 MG tablet Take 30 mg by mouth daily.   Marland Kitchen aspirin EC 81 MG tablet Take 1 tablet (81 mg total) by mouth daily. Swallow whole.  Marland Kitchen atorvastatin (LIPITOR) 40 MG tablet Take 2 tablets (80 mg total) by mouth daily.  Marland Kitchen dexlansoprazole (DEXILANT) 60 MG capsule Take 1 capsule (60 mg total) by mouth daily. Office visit for further refills  . diclofenac sodium (VOLTAREN) 1 % GEL Apply 2 g topically 4 (four) times daily.   . fexofenadine (ALLEGRA) 180 MG tablet Take 1 tablet (180 mg total) by mouth daily.  Marland Kitchen FLUoxetine (PROZAC) 20 MG capsule Take 20 mg by mouth 2 (two) times daily.   . fluticasone (FLONASE) 50 MCG/ACT nasal spray SPRAY TWO SPRAYS IN EACH NOSTRIL ONCE DAILY  . folic acid (FOLVITE) 1 MG tablet Take 1 tablet (1 mg total) by mouth daily.  Marland Kitchen HYDROcodone-acetaminophen (NORCO/VICODIN) 5-325 MG tablet Take 1 tablet by mouth 5 (five) times daily as needed for moderate pain.   Marland Kitchen lamoTRIgine (LAMICTAL) 100 MG tablet Take 100 mg by mouth 2 (two) times daily.   Marland Kitchen losartan (COZAAR) 50 MG tablet Take 1 tablet (50 mg total) by mouth daily.  . mesalamine (ROWASA) 4 g enema Place 60 mLs (4 g total) rectally at bedtime.  . methocarbamol (ROBAXIN) 500 MG tablet Take  500 mg by mouth 2 (two) times daily as needed for muscle spasms.   . metoprolol tartrate (LOPRESSOR) 25 MG tablet Take 1 tablet (25 mg total) by mouth 2 (two) times daily.  . montelukast (SINGULAIR) 10 MG tablet TAKE ONE TABLET BY MOUTH AT BEDTIME  . Multiple Vitamin (MULTIVITAMIN WITH MINERALS) TABS tablet Take 1 tablet by mouth daily.  Marland Kitchen NARCAN 4 MG/0.1ML LIQD nasal spray kit Place 0.4 mg into the nose once.   . ondansetron (ZOFRAN) 4 MG tablet Take 1 tablet (4 mg total) by mouth every 6 (six) hours as needed for nausea or vomiting.  . promethazine (PHENERGAN) 25 MG suppository PLACE ONE SUPPOSITORY RECTALLY  EVERY 6 HOURS AS NEEDED FOR NAUSEA/VOMITING  . Respiratory Therapy Supplies (FLUTTER) DEVI 3 puffs by Does not apply route 4 (four) times daily as needed. Needs to use device 20 min 3-4 times a day for coughing  . thiamine (VITAMIN B-1) 50 MG tablet Take 1 tablet (50 mg total) by mouth daily.  . Tiotropium Bromide Monohydrate (SPIRIVA RESPIMAT) 2.5 MCG/ACT AERS Inhale 2 puffs into the lungs daily.  . traMADol (ULTRAM) 50 MG tablet Take 50 mg by mouth every 8 (eight) hours as needed (break through pain).     Review of Systems  Constitutional: Negative for chills, fatigue and fever.  Respiratory: Positive for cough (intermittent/improved from past). Negative for chest tightness, shortness of breath and wheezing.   Cardiovascular: Negative for chest pain, palpitations and leg swelling.    Objective:  There were no vitals taken for this visit.      BP Readings from Last 3 Encounters:  03/06/20 124/78  03/05/20 134/73  02/06/20 134/89   Wt Readings from Last 3 Encounters:  03/06/20 231 lb (104.8 kg)  03/05/20 218 lb (98.9 kg)  01/29/20 224 lb (101.6 kg)    EXAM:  GENERAL: alert, oriented, appears well and in no acute distress  HEENT: atraumatic, conjunctiva clear, no obvious abnormalities on inspection of external nose and ears.  Unable to examine throat on video feed.  NECK: normal movements of the head and neck  LUNGS: on inspection no signs of respiratory distress, breathing rate appears normal, no obvious gross SOB, gasping or wheezing  CV: no obvious cyanosis  MS: moves all visible extremities without noticeable abnormality  PSYCH/NEURO: pleasant and cooperative, no obvious depression or anxiety, speech and thought processing grossly intact   Assessment/Plan  1. Sore throat From her description of symptoms, I suspect thrush.  We did discuss that sometimes we can see sores in the mouth associated with other bacterial or viral infections.  I encouraged her to start with  the nystatin mouth and throat gargle but if any worsening of sore throat, to start antibiotic.  We discussed that symptoms should not worsen after starting treatment.  If any change in symptoms, instructed her to let primary provider know.  2. Thrush See above.    I discussed the assessment and treatment plan with the patient. The patient was provided an opportunity to ask questions and all were answered. The patient agreed with the plan and demonstrated an understanding of the instructions.   The patient was advised to call back or seek an in-person evaluation if the symptoms worsen or if the condition fails to improve as anticipated.  I provided 18 minutes of non-face-to-face time during this encounter.   Micheline Rough, MD   NOTE: [facility] unable to compound so both rx for magic mouthwash and amox (if needed) called in  to [facility] [address]. Patient made aware of pharmacy change.

## 2020-03-31 DIAGNOSIS — M5459 Other low back pain: Secondary | ICD-10-CM | POA: Diagnosis not present

## 2020-04-01 ENCOUNTER — Ambulatory Visit: Payer: PPO | Admitting: Internal Medicine

## 2020-04-01 ENCOUNTER — Telehealth: Payer: Self-pay | Admitting: Family

## 2020-04-01 NOTE — Telephone Encounter (Signed)
    Please send order for magic mouthwash w/lidocaine SOLN to [facility] today. [facility] did not have mouthwash per patient

## 2020-04-02 ENCOUNTER — Telehealth: Payer: Self-pay | Admitting: Internal Medicine

## 2020-04-02 ENCOUNTER — Telehealth: Payer: Self-pay | Admitting: Family

## 2020-04-02 MED ORDER — MAGIC MOUTHWASH W/LIDOCAINE: 5.0000 mL | Freq: Four times a day (QID) | ORAL | 0 refills | Status: DC

## 2020-04-02 NOTE — Telephone Encounter (Signed)
Pt will keep appt as rescheduled to see Dr. Gasper Sells for 04/09/20.  She would like the in-person visit instead.

## 2020-04-02 NOTE — Telephone Encounter (Signed)
Rx phoned in to [facility] and received by Jenny Reichmann the pharmacist.

## 2020-04-02 NOTE — Telephone Encounter (Signed)
Patient wants to know if she could do a virtual visit with Dr. Gasper Sells tomorrow instead of coming in. States that she has a sore throat and is not feeling well.

## 2020-04-02 NOTE — Telephone Encounter (Signed)
Not sure what to make of that message - it was a little confusing (although I had [facility] the first time so I feel pretty certain she got this prescription the first time; unless she just didn't pick it up) So she should have still had some left from what I sent in, and I was hoping that her condition would improve with a week of treatment. If it is helping; it is fine to send in a refill and have her complete another course (they can just refill same size they gave her this time). If not helping; she needs an in person evaluation.

## 2020-04-02 NOTE — Addendum Note (Signed)
Addended by: Elza Rafter D on: 04/02/2020 01:58 PM   Modules accepted: Orders

## 2020-04-02 NOTE — Telephone Encounter (Signed)
It won't send send electronically. I called it in before. Can you just call in refill to [facility] please? I had called in before so they should just be able to refill.

## 2020-04-02 NOTE — Telephone Encounter (Signed)
Error

## 2020-04-02 NOTE — Telephone Encounter (Signed)
  Pt is requesting a refill on  magic mouthwash w/lidocaine SOLN  Sent to the [address] [facility] [address] [place], [place] [address]

## 2020-04-02 NOTE — Telephone Encounter (Signed)
Pt was scheduled to see Dr. Gasper Sells for tomorrow 2/10, for follow-up.  Pt called to reschedule to a later date, due to new onset sore throat and not feeling well today.  Pt rescheduled her follow-up appt with Dr. Gasper Sells for 04/09/20 at 1:20 pm, as in-person visit. Will send this information to Dr. Gasper Sells as a general FYI.

## 2020-04-03 ENCOUNTER — Ambulatory Visit: Payer: PPO | Admitting: Internal Medicine

## 2020-04-03 NOTE — Telephone Encounter (Signed)
It was called in at 4:57pm yesterday by the CMA. If the pt persists to have symptoms after this new refill, Dr Ethlyn Gallery stated that she will need an in person visit.

## 2020-04-03 NOTE — Telephone Encounter (Signed)
I am not going to be able to refill the Magic Mouthwash for her. It looks like she was told by Dr. Ethlyn Gallery that she would need an in person visit if the symptoms persist.

## 2020-04-05 ENCOUNTER — Telehealth: Payer: PPO | Admitting: Family Medicine

## 2020-04-05 ENCOUNTER — Telehealth (INDEPENDENT_AMBULATORY_CARE_PROVIDER_SITE_OTHER): Payer: PPO | Admitting: Family Medicine

## 2020-04-05 ENCOUNTER — Encounter: Payer: Self-pay | Admitting: Family Medicine

## 2020-04-05 ENCOUNTER — Encounter: Payer: Self-pay | Admitting: Family

## 2020-04-05 DIAGNOSIS — J029 Acute pharyngitis, unspecified: Secondary | ICD-10-CM | POA: Diagnosis not present

## 2020-04-05 DIAGNOSIS — R208 Other disturbances of skin sensation: Secondary | ICD-10-CM

## 2020-04-05 MED ORDER — MAGIC MOUTHWASH W/LIDOCAINE: 5.0000 mL | ORAL | 0 refills | Status: DC | PRN

## 2020-04-05 NOTE — Progress Notes (Signed)
Chief Complaint  Patient presents with  . Follow-up    Subjective: Patient is a 60 y.o. female here for f/u. Due to COVID-19 pandemic, we are interacting via telephone. I verified patient's ID using 2 identifiers. Patient agreed to proceed with visit via this method. Patient is at home, I am at office. Patient and I are present for visit.   This has been going on for 2 weeks. She is having burning in her mouth and throat. Her tongue is red. She has used Nystatin and amoxicillin (has taken 3 d worth). She has a white ring with red center. There is swelling. This is not getting worse/better. Magic mouthwash w lidocaine 4x/d.   Past Medical History:  Diagnosis Date  . ABDOMINAL PAIN -GENERALIZED 09/27/2008   Qualifier: Diagnosis of  By: Trellis Paganini PA-c, Amy S   . Acute epigastric pain 04/27/2011  . Acute pancreatitis 08/12/2012  . Allergic rhinitis 09/05/2017  . Anal fistula   . Anemia   . Anxiety   . Arthritis   . BIPOLAR AFFECTIVE DISORDER 12/06/2006   Qualifier: History of  By: Elsworth Soho MD, Leanna Sato Bipolar disorder ([place])   . Chronic kidney disease   . COPD (chronic obstructive pulmonary disease) ([place]) 11/08/2017  . Cough 12/06/2006   Annotation: chronic since'96, nml spirometry, +ve methacholine challenge, nml  CT sinuses, inconclusive pH probe, esophageal manometry Centricity Description: SYMPTOM, COUGH Qualifier: History of  By: Elsworth Soho MD, Leanna Sato   Centricity Description: COUGH Qualifier: Diagnosis of  By: Elsworth Soho MD, Leanna Sato    . Depressive disorder, not elsewhere classified   . Diarrhea 09/27/2008   Qualifier: Diagnosis of  By: Trellis Paganini PA-c, Amy S   . Diverticulosis of colon (without mention of hemorrhage)   . Elevated liver enzymes 10/31/2015  . Esophageal reflux   . ETOH abuse 08/14/2012  . Fatty liver   . Gastritis 04/28/2011  . GERD (gastroesophageal reflux disease)   . Hematochezia 08/14/2012  . Hemoptysis 05/26/2017  . HEMORRHOIDS 09/12/2007   Qualifier: Diagnosis of  By: Nils Pyle  CMA ([place]), Mearl Latin    . Hepatic steatosis   . Hiatal hernia   . HIATAL HERNIA 09/12/2007   Qualifier: Diagnosis of  By: Nils Pyle CMA ([place]), Mearl Latin    . History of recurrent UTIs   . Hyperlipidemia   . Hypertension   . Hypertensive retinopathy of both eyes 11/24/2015  . INFECTIOUS DIARRHEA 09/27/2008   Qualifier: Diagnosis of  By: Laney Potash, Pam    . Iron deficiency   . Iron overload 09/22/2017  . Nausea alone 09/27/2008   Qualifier: Diagnosis of  By: Laney Potash, [place] 09/27/2008   Qualifier: Diagnosis of  By: Shane Crutch, Amy S   . Obesity   . Obesity, Class III, BMI 40-49.9 (morbid obesity) ([place]) 04/27/2011  . Pancreatitis   . Personal history of colonic polyps 10/16/2009   hyperplastic  . Polyarthralgia 12/02/2015   Evaluated by Dr. Charlestine Night (rheumatologist) 12/29/2015:1): 1)possible fibromyalgia (Suggest physical therapy and flexeril), 2) Polyarthralgia/Insomnia (suggest use of robaxin or flexeril), 3) Nicotine Dependence (counseled to quit). 4)Depression (continue therapy with psychiatrist)  . Pre-diabetes   . Pseudocyst of pancreas 08/12/2012  . Pulmonary nodule, right 11/29/2016  . Rectal fissure 05/11/2011  . Rectal fistula 05/11/2011  . RECTAL PAIN 09/12/2007   Qualifier: Diagnosis of  By: Nils Pyle CMA ([place]), Mearl Latin    . Secondary hemochromatosis 09/22/2017  . Seizures ([place])   . SIRS (systemic inflammatory  response syndrome) ([place]) 03/11/2013  . Solitary rectal ulcer 04/12/2011  . TOBACCO ABUSE 12/06/2006   Qualifier: History of  By: Elsworth Soho MD, Leanna Sato.    . Ulcerative colitis, unspecified   . Ulcerative proctitis, nonspecific ([place]) 03/10/2011  . Varicose veins of both lower extremities 12/02/2015    Objective: No conversational dyspnea Age appropriate judgment and insight Nml affect and mood  Assessment and Plan: Sore throat - Plan: magic mouthwash w/lidocaine SOLN  Burning sensation of mouth - Plan: magic mouthwash w/lidocaine SOLN  Cont magic  mouthwash. Change toothpaste to Sensodyne. Sched appt w dentist. Follow up in office in person if no better/no plan from dental team.  Total time: 12 min The patient voiced understanding and agreement to the plan.  [place], DO 04/05/20  9:45 AM

## 2020-04-07 ENCOUNTER — Other Ambulatory Visit: Payer: PPO

## 2020-04-07 ENCOUNTER — Telehealth: Payer: Self-pay

## 2020-04-07 NOTE — Telephone Encounter (Signed)
Who Is Calling Pharmacy Call Type Pharmacy Send to RN Chief Complaint Paging or Request for Consult Reason for Call Request to speak to Physician Initial Comment Caller states he is from [facility] in [place], He has an Rx for a pt he needs to verify ingredients on. Pharmacy Name [facility] Pharmacist Name [place] Number [phone] Translation No Disp. Time Eilene Ghazi Time) Disposition Final User 04/05/2020 11:32:34 AM Send To RN Personal Carlena Bjornstad, RN, Arsenio Katz 4/84/0397 95:36:92 PM Called On-Call Provider Carlena Bjornstad, RN, Arsenio Katz 2/30/0979 49:97:18 PM Clinical Call Yes Carlena Bjornstad, RN, Arsenio Katz Comments User: Jerrye Beavers Date/Time Eilene Ghazi Time): 04/05/2020 11:58:13 AM PT has called re RX that pharmacy has not received. Transferred to nurse. User: Jonny Ruiz, RN Date/Time Eilene Ghazi Time): 04/05/2020 12:09:01 PM Informed the pharmacy that : Provider stated that the magic mouth wash should be: Nystatin, maalox, lidocaine 1 to 1 to 1. Use as needed. Rinse spit do not swallow. Paging DoctorName Phone DateTime Result/Outcome Message Type Notes Nani Ravens MD [phone] 04/05/2020 12:06:40 PM Called On Call Provider - Reached Doctor Paged [place], - MD 04/05/2020 12:06:45 PM Spoke with On Call - General Message Result Provider stated that the magic mouth wash should be: Nystatin, maalox, lidocaine 1 to 1 to 1. Use as needed. Rinse spit do not swallow

## 2020-04-08 ENCOUNTER — Other Ambulatory Visit: Payer: Self-pay | Admitting: Internal Medicine

## 2020-04-09 ENCOUNTER — Ambulatory Visit: Payer: PPO | Admitting: Internal Medicine

## 2020-04-12 ENCOUNTER — Encounter: Payer: Self-pay | Admitting: Family

## 2020-04-13 ENCOUNTER — Encounter: Payer: Self-pay | Admitting: Family

## 2020-04-15 ENCOUNTER — Encounter: Payer: Self-pay | Admitting: Internal Medicine

## 2020-04-19 DIAGNOSIS — M545 Low back pain, unspecified: Secondary | ICD-10-CM | POA: Diagnosis not present

## 2020-04-21 ENCOUNTER — Telehealth: Payer: Self-pay

## 2020-04-21 ENCOUNTER — Other Ambulatory Visit: Payer: PPO

## 2020-04-21 DIAGNOSIS — J029 Acute pharyngitis, unspecified: Secondary | ICD-10-CM

## 2020-04-21 DIAGNOSIS — R208 Other disturbances of skin sensation: Secondary | ICD-10-CM

## 2020-04-21 MED ORDER — MAGIC MOUTHWASH W/LIDOCAINE: 5.0000 mL | ORAL | 0 refills | Status: DC | PRN

## 2020-04-21 NOTE — Telephone Encounter (Signed)
Who Is Calling Patient / Member / Family / Caregiver Call Type Triage / Clinical Relationship To Patient Self Return Phone Number [phone] (Primary) Chief Complaint Prescription Refill or Medication Request (non symptomatic) Reason for Call Medication Question / Request Initial Comment Caller states she had a virtually visit with doctor. Caller states the doctor called her in a mouth wash. Caller states she need a refill on the mouth wash. [facility] [phone] RX [phone] Translation No Nurse Assessment Nurse: Laverta [place], RN, Erin Date/Time (Eastern Time): 04/19/2020 1:08:31 PM Confirm and document reason for call. If symptomatic, describe symptoms. ---Caller states she had a virtually visit with doctor. Caller states the doctor called her in a mouth wash . Caller states she need a refill on the mouth wash. [facility] [phone] RX [phone]

## 2020-04-21 NOTE — Telephone Encounter (Signed)
Faxed to [facility]

## 2020-04-21 NOTE — Telephone Encounter (Signed)
Printed to be faxed to [facility]. Ty.

## 2020-04-21 NOTE — Telephone Encounter (Signed)
Patient states medication needs to go to  [facility], [place] Phone:  [phone]  Fax:  [phone]

## 2020-04-21 NOTE — Telephone Encounter (Signed)
Faxed to [facility]

## 2020-04-22 ENCOUNTER — Other Ambulatory Visit: Payer: Self-pay | Admitting: Internal Medicine

## 2020-04-22 DIAGNOSIS — M545 Low back pain, unspecified: Secondary | ICD-10-CM | POA: Diagnosis not present

## 2020-04-22 DIAGNOSIS — R310 Gross hematuria: Secondary | ICD-10-CM | POA: Diagnosis not present

## 2020-04-22 DIAGNOSIS — N3000 Acute cystitis without hematuria: Secondary | ICD-10-CM | POA: Diagnosis not present

## 2020-04-23 DIAGNOSIS — G894 Chronic pain syndrome: Secondary | ICD-10-CM | POA: Diagnosis not present

## 2020-04-24 ENCOUNTER — Other Ambulatory Visit: Payer: PPO

## 2020-04-24 DIAGNOSIS — M5416 Radiculopathy, lumbar region: Secondary | ICD-10-CM | POA: Diagnosis not present

## 2020-04-25 ENCOUNTER — Telehealth: Payer: Self-pay | Admitting: Internal Medicine

## 2020-04-25 ENCOUNTER — Telehealth: Payer: Self-pay | Admitting: Family

## 2020-04-25 NOTE — Telephone Encounter (Signed)
Pt states she has a lot going on with mouth sores and sores in her throat. Has upcoming appt with ENT doc, has seeen an oral surgeon and is scheduled to see endocrinologist. Pt is requesting refill on nausea med. States she is not albe to eat much and she thinks that is part of her problem. She takes the meds and isn't eating much and then gets nauseated. Let pt know Dr. [name] is out of the office but not would be sent to him. Encouraged pt to contact her PCP or the doctors that are prescribing the meds that are making her nauseated, she reports she isn't sure which meds are causing it. Please advise.

## 2020-04-25 NOTE — Telephone Encounter (Signed)
Pt states she has a lot going on with mouth sores and sores in her throat. Has upcoming appt with ENT doc, has seeen an oral surgeon and is scheduled to see endocrinologist. Pt is requesting refill on nausea med. States she is not albe to eat much and she thinks that is part of her problem with her anemia. She takes the meds and isn't eating much and then gets nauseated. She is wondering if we can send in the suppository for he nausea because she cannot keep any food down

## 2020-04-26 ENCOUNTER — Other Ambulatory Visit: Payer: Self-pay | Admitting: Nurse Practitioner

## 2020-04-26 ENCOUNTER — Telehealth: Payer: Self-pay | Admitting: Nurse Practitioner

## 2020-04-26 MED ORDER — PROMETHAZINE HCL 25 MG RE SUPP: RECTAL | 1 refills | Status: DC

## 2020-04-26 NOTE — Telephone Encounter (Signed)
Patient called our answering service this morning and I spoke with her around 8:30am. She complains of having persistent nausea. No vomiting. She requested a refill of Phenergan suppositories which have worked well for her nausea. She has oral sores, unclear etiology, unresponsive to magic mouthwash or viscous Lidocaine. She is having difficulty eating food due to the painful mouth sores. Fluid intake is low due to her nausea. No abd pain. I sent Rx to her pharmacy for Phenergan suppositories to utilize twice daily, patient to start ASAP and to push fluids as tolerated.  I advised the patient to go to the urgent care center if she is unable to hydrate well and/or if she does not urinate within the next 6 hours.  I also advised the patient to contact her oral surgeon for further recommendations regarding her painful oral ulcers.  She is scheduled for oral ulcer biopsy next week.  She agreed to follow these instructions.  Vaughan Basta please contact the patient on Monday 3/7 and provide Dr. [name] with an update. Thx.

## 2020-04-26 NOTE — Telephone Encounter (Signed)
Agree with your advice. OK to refill Zofran. Thanks

## 2020-04-28 ENCOUNTER — Telehealth: Payer: Self-pay | Admitting: Internal Medicine

## 2020-04-28 ENCOUNTER — Other Ambulatory Visit: Payer: Self-pay

## 2020-04-28 NOTE — Telephone Encounter (Signed)
Pt called back over the weekend and Carl Best NP called in [place]. For her.

## 2020-04-28 NOTE — Telephone Encounter (Signed)
Left message for pt to call back  °

## 2020-04-28 NOTE — Telephone Encounter (Signed)
Pt states she is doing much better, the supp helped her with her nausea.

## 2020-04-28 NOTE — Telephone Encounter (Signed)
See additional note. 

## 2020-04-29 ENCOUNTER — Ambulatory Visit (INDEPENDENT_AMBULATORY_CARE_PROVIDER_SITE_OTHER): Payer: PPO | Admitting: Endocrinology

## 2020-04-29 VITALS — BP 118/50 | HR 86 | Ht 65.0 in | Wt 241.6 lb

## 2020-04-29 DIAGNOSIS — R6889 Other general symptoms and signs: Secondary | ICD-10-CM

## 2020-04-29 LAB — CORTISOL
Cortisol, Plasma: 6.2 ug/dL
Cortisol, Plasma: 14.6 ug/dL

## 2020-04-29 MED ORDER — COSYNTROPIN 0.25 MG IJ SOLR
0.2500 mg | Freq: Once | INTRAMUSCULAR | Status: AC
Start: 2020-04-29 — End: 2020-04-29
  Administered 2020-04-29: 0.25 mg via INTRAVENOUS

## 2020-04-29 NOTE — Progress Notes (Signed)
Subjective:    Patient ID: [name] [name], female    DOB: May 30, 1960, 60 y.o.   MRN: [id number]  HPI Pt is referred by Dr Ronnald Ramp.  She has no h/o cancer, pituitary disorder, cataracts, PUD, HTN, amenorrhea, osteoporosis, DM, infection, bony fracture, or adrenal disorder.  She reports easy bruising, pain of the tongue and weight gain.  Pt took 3 courses of prednisone 12/21-1/22.  She will have bx or oral ulcer soon.  We are asked to r/o iatrogenic HPA insuff. Past Medical History:  Diagnosis Date  . ABDOMINAL PAIN -GENERALIZED 09/27/2008   Qualifier: Diagnosis of  By: Trellis Paganini PA-c, Amy S   . Acute epigastric pain 04/27/2011  . Acute pancreatitis 08/12/2012  . Allergic rhinitis 09/05/2017  . Anal fistula   . Anemia   . Anxiety   . Arthritis   . BIPOLAR AFFECTIVE DISORDER 12/06/2006   Qualifier: History of  By: Elsworth Soho MD, Leanna Sato Bipolar disorder ([employer])   . Chronic kidney disease   . COPD (chronic obstructive pulmonary disease) ([place]) 11/08/2017  . Cough 12/06/2006   Annotation: chronic since'96, nml spirometry, +ve methacholine challenge, nml  CT sinuses, inconclusive pH probe, esophageal manometry Centricity Description: SYMPTOM, COUGH Qualifier: History of  By: Elsworth Soho MD, Leanna Sato   Centricity Description: COUGH Qualifier: Diagnosis of  By: Elsworth Soho MD, Leanna Sato    . Depressive disorder, not elsewhere classified   . Diarrhea 09/27/2008   Qualifier: Diagnosis of  By: Trellis Paganini PA-c, Amy S   . Diverticulosis of colon (without mention of hemorrhage)   . Elevated liver enzymes 10/31/2015  . Esophageal reflux   . ETOH abuse 08/14/2012  . Fatty liver   . Gastritis 04/28/2011  . GERD (gastroesophageal reflux disease)   . Hematochezia 08/14/2012  . Hemoptysis 05/26/2017  . HEMORRHOIDS 09/12/2007   Qualifier: Diagnosis of  By: Nils Pyle CMA ([place]), Mearl Latin    . Hepatic steatosis   . Hiatal hernia   . HIATAL HERNIA 09/12/2007   Qualifier: Diagnosis of  By: Nils Pyle CMA ([place]), Mearl Latin    . History of  recurrent UTIs   . Hyperlipidemia   . Hypertension   . Hypertensive retinopathy of both eyes 11/24/2015  . INFECTIOUS DIARRHEA 09/27/2008   Qualifier: Diagnosis of  By: Laney Potash, [place]    . Iron deficiency   . Iron overload 09/22/2017  . Nausea alone 09/27/2008   Qualifier: Diagnosis of  By: Laney Potash, [place] 09/27/2008   Qualifier: Diagnosis of  By: Shane Crutch, Amy S   . Obesity   . Obesity, Class III, BMI 40-49.9 (morbid obesity) ([place]) 04/27/2011  . Pancreatitis   . Personal history of colonic polyps 10/16/2009   hyperplastic  . Polyarthralgia 12/02/2015   Evaluated by Dr. Charlestine Night (rheumatologist) 12/29/2015:1): 1)possible fibromyalgia (Suggest physical therapy and flexeril), 2) Polyarthralgia/Insomnia (suggest use of robaxin or flexeril), 3) Nicotine Dependence (counseled to quit). 4)Depression (continue therapy with psychiatrist)  . Pre-diabetes   . Pseudocyst of pancreas 08/12/2012  . Pulmonary nodule, right 11/29/2016  . Rectal fissure 05/11/2011  . Rectal fistula 05/11/2011  . RECTAL PAIN 09/12/2007   Qualifier: Diagnosis of  By: Nils Pyle CMA ([place]), Mearl Latin    . Secondary hemochromatosis 09/22/2017  . Seizures ([place])   . SIRS (systemic inflammatory response syndrome) ([place]) 03/11/2013  . Solitary rectal ulcer 04/12/2011  . TOBACCO ABUSE 12/06/2006   Qualifier: History of  By: Elsworth Soho MD, Leanna Sato.    . Ulcerative  colitis, unspecified   . Ulcerative proctitis, nonspecific ([place]) 03/10/2011  . Varicose veins of both lower extremities 12/02/2015    Past Surgical History:  Procedure Laterality Date  . FINGER AMPUTATION  2010   right index  . HEMORROIDECTOMY    . KNEE ARTHROSCOPY     bilateral, left x2  . TOTAL ABDOMINAL HYSTERECTOMY    . VIDEO BRONCHOSCOPY Bilateral 05/31/2017   Procedure: VIDEO BRONCHOSCOPY WITHOUT FLUORO;  Surgeon: Collene Gobble, MD;  Location: [facility];  Service: Cardiopulmonary;  Laterality: Bilateral;    Social History   Socioeconomic  History  . Marital status: Single    Spouse name: Not on file  . Number of children: 0  . Years of education: 2  . Highest education level: Not on file  Occupational History  . Occupation: produce Armed forces operational officer: UNEMPLOYED  Tobacco Use  . Smoking status: Former Smoker    Packs/day: 0.25    Years: 46.00    Pack years: 11.50    Types: Cigars, Cigarettes    Quit date: 04/23/2019    Years since quitting: 1.0  . Smokeless tobacco: Never Used  . Tobacco comment: Pt states vaping every day.  stopped vaping.  Vaping Use  . Vaping Use: Never used  Substance and Sexual Activity  . Alcohol use: Yes    Alcohol/week: 1.0 standard drink    Types: 1 Shots of liquor per week    Comment: occ  . Drug use: No  . Sexual activity: Not Currently  Other Topics Concern  . Not on file  Social History Narrative  . Not on file   Social Determinants of Health   Financial Resource Strain: Not on file  Food Insecurity: Not on file  Transportation Needs: Not on file  Physical Activity: Not on file  Stress: Not on file  Social Connections: Not on file  Intimate Partner Violence: Not on file    Current Outpatient Medications on File Prior to Visit  Medication Sig Dispense Refill  . albuterol (VENTOLIN HFA) 108 (90 Base) MCG/ACT inhaler Inhale 2 puffs into the lungs every 6 (six) hours as needed for wheezing or shortness of breath. 18 g 6  . ARIPiprazole (ABILIFY) 30 MG tablet Take 30 mg by mouth daily.     Marland Kitchen aspirin EC 81 MG tablet Take 1 tablet (81 mg total) by mouth daily. Swallow whole. 90 tablet 3  . atorvastatin (LIPITOR) 40 MG tablet Take 2 tablets (80 mg total) by mouth daily. 180 tablet 3  . dexlansoprazole (DEXILANT) 60 MG capsule Take 1 capsule (60 mg total) by mouth daily. Office visit for further refills 30 capsule 2  . diclofenac sodium (VOLTAREN) 1 % GEL Apply 2 g topically 4 (four) times daily as needed (pain).    . fexofenadine (ALLEGRA) 180 MG tablet Take 1 tablet (180 mg total)  by mouth daily. 30 tablet 11  . FLUoxetine (PROZAC) 20 MG capsule Take 20 mg by mouth 2 (two) times daily.     . fluticasone (FLONASE) 50 MCG/ACT nasal spray SPRAY TWO SPRAYS IN EACH NOSTRIL ONCE DAILY (Patient taking differently: Place 2 sprays into both nostrils daily. SPRAY TWO SPRAYS IN EACH NOSTRIL ONCE DAILY) 48 g 2  . folic acid (FOLVITE) 1 MG tablet Take 1 tablet (1 mg total) by mouth daily. 90 tablet 1  . HYDROcodone-acetaminophen (NORCO/VICODIN) 5-325 MG tablet Take 1 tablet by mouth 5 (five) times daily as needed for moderate pain.     Marland Kitchen lamoTRIgine (  LAMICTAL) 100 MG tablet Take 100 mg by mouth 2 (two) times daily.     Marland Kitchen losartan (COZAAR) 50 MG tablet Take 1 tablet (50 mg total) by mouth daily. 90 tablet 1  . magic mouthwash w/lidocaine SOLN Take 5-10 mLs by mouth every 4 (four) hours as needed for mouth pain. Swish and gargle. (Patient not taking: No sig reported) 300 mL 0  . mesalamine (ROWASA) 4 g enema Place 60 mLs (4 g total) rectally at bedtime. (Patient taking differently: Place 4 g rectally at bedtime as needed (UC).) 60 mL 30  . methocarbamol (ROBAXIN) 500 MG tablet Take 500 mg by mouth 2 (two) times daily as needed for muscle spasms.     . metoprolol tartrate (LOPRESSOR) 25 MG tablet Take 1 tablet (25 mg total) by mouth 2 (two) times daily. 180 tablet 2  . montelukast (SINGULAIR) 10 MG tablet TAKE ONE TABLET BY MOUTH AT BEDTIME (Patient taking differently: Take 10 mg by mouth at bedtime.) 90 tablet 2  . Multiple Vitamin (MULTIVITAMIN WITH MINERALS) TABS tablet Take 1 tablet by mouth daily. 30 tablet 0  . NARCAN 4 MG/0.1ML LIQD nasal spray kit Place 0.4 mg into the nose once.     . ondansetron (ZOFRAN) 4 MG tablet Take 1 tablet (4 mg total) by mouth every 6 (six) hours as needed for nausea or vomiting. 30 tablet 0  . promethazine (PHENERGAN) 25 MG suppository PLACE ONE SUPPOSITORY RECTALLY EVERY 6 HOURS AS NEEDED FOR NAUSEA/VOMITING (Patient taking differently: Place 25 mg rectally  every 6 (six) hours as needed for nausea or vomiting. PLACE ONE SUPPOSITORY RECTALLY EVERY 6 HOURS AS NEEDED FOR NAUSEA/VOMITING) 12 suppository 1  . Respiratory Therapy Supplies (FLUTTER) DEVI 3 puffs by Does not apply route 4 (four) times daily as needed. Needs to use device 20 min 3-4 times a day for coughing 1 each 0  . thiamine (VITAMIN B-1) 50 MG tablet Take 1 tablet (50 mg total) by mouth daily. 90 tablet 1  . Tiotropium Bromide Monohydrate (SPIRIVA RESPIMAT) 2.5 MCG/ACT AERS Inhale 2 puffs into the lungs daily. 4 g 11  . traMADol (ULTRAM) 50 MG tablet Take 50 mg by mouth every 8 (eight) hours as needed (break through pain).     . nitrofurantoin (MACRODANTIN) 100 MG capsule Take 100 mg by mouth 2 (two) times daily.    . tamsulosin (FLOMAX) 0.4 MG CAPS capsule Take 0.4 mg by mouth daily.    . vitamin B-12 (CYANOCOBALAMIN) 1000 MCG tablet Take 1,000 mcg by mouth daily.     No current facility-administered medications on file prior to visit.    Allergies  Allergen Reactions  . Doxycycline     Diarrhea/ nausea    Family History  Problem Relation Age of Onset  . Irritable bowel syndrome Mother   . Hypertension Mother   . Atrial fibrillation Mother   . Ovarian cancer Maternal Aunt   . Diabetes Father   . Heart attack Father   . Hypertension Father   . Hyperlipidemia Father   . Thyroid disease Sister   . Hyperlipidemia Sister   . Thyroid disease Brother   . Colon polyps Brother   . Stomach cancer Neg Hx   . Colon cancer Neg Hx     BP (!) 118/50 (BP Location: Right Arm, Patient Position: Sitting, Cuff Size: Large)   Pulse 86   Ht $R'5\' 5"'Ef$  (1.651 m)   Wt 241 lb 9.6 oz (109.6 kg)   SpO2 94%   BMI  40.20 kg/m    Review of Systems denies headache and hirsutism.  Depression is well-controlled.      Objective:   Physical Exam VS: see vs page GEN: no distress.  In [facility] HEAD: head: no deformity eyes: no periorbital swelling, no proptosis external nose and ears are normal NECK:  supple, thyroid is not enlarged CHEST WALL: no deformity LUNGS: clear to auscultation CV: reg rate and rhythm, no murmur.  MUSCULOSKELETAL: gait is normal and steady EXTEMITIES: no deformity.  1+ right, and trace left leg edema NEURO:  readily moves all 4's.  sensation is intact to touch on all 4's SKIN:  Normal texture and temperature.  No rash or suspicious lesion is visible.  No striae of the abd or flanks.   NODES:  None palpable at the neck.   PSYCH: alert, well-oriented.  Does not appear anxious nor depressed.    Lab Results  Component Value Date   TSH 2.75 03/07/2020   Lab Results  Component Value Date   HGBA1C 5.9 03/07/2020    Cortisol=1.9  I have reviewed outside records, and summarized: Pt was noted to have prednisone rx, and referred here.  Dr Ronnald Ramp says iatrogenic adrenal insuff, due to prednisone, should be ruled out.      ACTH stimulation test is done: baseline cortisol level=6 then Cosyntropin 250 mcg is given im 45 minutes later, cortisol level=15 (mildly subnormal response).     Assessment & Plan:  Hypocortisolism: persistent, due to steroid rx.  I told pt we'll follow this, as recovery is expected.  No medication is needed now.

## 2020-04-29 NOTE — Patient Instructions (Signed)
Blood tests are requested for you today.  We'll let you know about the results.  

## 2020-04-30 ENCOUNTER — Encounter (INDEPENDENT_AMBULATORY_CARE_PROVIDER_SITE_OTHER): Payer: Self-pay | Admitting: Otolaryngology

## 2020-04-30 ENCOUNTER — Other Ambulatory Visit: Payer: Self-pay

## 2020-04-30 ENCOUNTER — Ambulatory Visit (INDEPENDENT_AMBULATORY_CARE_PROVIDER_SITE_OTHER): Payer: PPO | Admitting: Otolaryngology

## 2020-04-30 VITALS — Temp 97.3°F

## 2020-04-30 DIAGNOSIS — J029 Acute pharyngitis, unspecified: Secondary | ICD-10-CM | POA: Diagnosis not present

## 2020-04-30 NOTE — Telephone Encounter (Signed)
Pt states she got Phenergan suppositories from her GI doctor & is feeling better.  She has seen the oral surgeon & is scheduled for a f/u visit with them tomorrow.  Pt advised to contact PCP if she feels like she is unable to eat.  Pt verb understanding & has no additional ques/concerns at this time.

## 2020-04-30 NOTE — Progress Notes (Signed)
HPI: [name] [name] is a 60 y.o. female who presents for evaluation of chronic sore throat and sore mouth.  This began around Thanksgiving of last year.  She complained of a sore throat and was treated with 3 rounds of antibiotics as well as 2 rounds of steroids.  She subsequently developed a thrush and was treated with nystatin mouth rinse as well as Magic mouthwash.  She is also has some chronic sores on her tongue was recently seen by oral surgery.  She complains of soreness in the back of her throat as well as some soreness in the mouth with the tongue sores are. She has had no weight loss She is scheduled to see oral surgeon tomorrow for possible biopsy.  Past Medical History:  Diagnosis Date  . ABDOMINAL PAIN -GENERALIZED 09/27/2008   Qualifier: Diagnosis of  By: Trellis Paganini PA-c, Amy S   . Acute epigastric pain 04/27/2011  . Acute pancreatitis 08/12/2012  . Allergic rhinitis 09/05/2017  . Anal fistula   . Anemia   . Anxiety   . Arthritis   . BIPOLAR AFFECTIVE DISORDER 12/06/2006   Qualifier: History of  By: Elsworth Soho MD, Leanna Sato Bipolar disorder ([place])   . Chronic kidney disease   . COPD (chronic obstructive pulmonary disease) ([place]) 11/08/2017  . Cough 12/06/2006   Annotation: chronic since'96, nml spirometry, +ve methacholine challenge, nml  CT sinuses, inconclusive pH probe, esophageal manometry Centricity Description: SYMPTOM, COUGH Qualifier: History of  By: Elsworth Soho MD, Leanna Sato   Centricity Description: COUGH Qualifier: Diagnosis of  By: Elsworth Soho MD, Leanna Sato    . Depressive disorder, not elsewhere classified   . Diarrhea 09/27/2008   Qualifier: Diagnosis of  By: Trellis Paganini PA-c, Amy S   . Diverticulosis of colon (without mention of hemorrhage)   . Elevated liver enzymes 10/31/2015  . Esophageal reflux   . ETOH abuse 08/14/2012  . Fatty liver   . Gastritis 04/28/2011  . GERD (gastroesophageal reflux disease)   . Hematochezia 08/14/2012  . Hemoptysis 05/26/2017  . HEMORRHOIDS 09/12/2007    Qualifier: Diagnosis of  By: Nils Pyle CMA ([place]), Mearl Latin    . Hepatic steatosis   . Hiatal hernia   . HIATAL HERNIA 09/12/2007   Qualifier: Diagnosis of  By: Nils Pyle CMA ([place]), Mearl Latin    . History of recurrent UTIs   . Hyperlipidemia   . Hypertension   . Hypertensive retinopathy of both eyes 11/24/2015  . INFECTIOUS DIARRHEA 09/27/2008   Qualifier: Diagnosis of  By: Laney Potash, Pam    . Iron deficiency   . Iron overload 09/22/2017  . Nausea alone 09/27/2008   Qualifier: Diagnosis of  By: Laney Potash, [place] 09/27/2008   Qualifier: Diagnosis of  By: Shane Crutch, Amy S   . Obesity   . Obesity, Class III, BMI 40-49.9 (morbid obesity) ([place]) 04/27/2011  . Pancreatitis   . Personal history of colonic polyps 10/16/2009   hyperplastic  . Polyarthralgia 12/02/2015   Evaluated by Dr. Charlestine Night (rheumatologist) 12/29/2015:1): 1)possible fibromyalgia (Suggest physical therapy and flexeril), 2) Polyarthralgia/Insomnia (suggest use of robaxin or flexeril), 3) Nicotine Dependence (counseled to quit). 4)Depression (continue therapy with psychiatrist)  . Pre-diabetes   . Pseudocyst of pancreas 08/12/2012  . Pulmonary nodule, right 11/29/2016  . Rectal fissure 05/11/2011  . Rectal fistula 05/11/2011  . RECTAL PAIN 09/12/2007   Qualifier: Diagnosis of  By: Nils Pyle CMA ([place]), Mearl Latin    . Secondary hemochromatosis 09/22/2017  .  Seizures ([place])   . SIRS (systemic inflammatory response syndrome) ([place]) 03/11/2013  . Solitary rectal ulcer 04/12/2011  . TOBACCO ABUSE 12/06/2006   Qualifier: History of  By: Elsworth Soho MD, Leanna Sato.    . Ulcerative colitis, unspecified   . Ulcerative proctitis, nonspecific ([place]) 03/10/2011  . Varicose veins of both lower extremities 12/02/2015   Past Surgical History:  Procedure Laterality Date  . FINGER AMPUTATION  2010   right index  . HEMORROIDECTOMY    . KNEE ARTHROSCOPY     bilateral, left x2  . TOTAL ABDOMINAL HYSTERECTOMY    . VIDEO BRONCHOSCOPY Bilateral  05/31/2017   Procedure: VIDEO BRONCHOSCOPY WITHOUT FLUORO;  Surgeon: Collene Gobble, MD;  Location: [facility];  Service: Cardiopulmonary;  Laterality: Bilateral;   Social History   Socioeconomic History  . Marital status: Single    Spouse name: Not on file  . Number of children: 0  . Years of education: 2  . Highest education level: Not on file  Occupational History  . Occupation: produce Armed forces operational officer: UNEMPLOYED  Tobacco Use  . Smoking status: Former Smoker    Packs/day: 0.25    Years: 46.00    Pack years: 11.50    Types: Cigars, Cigarettes    Quit date: 04/23/2019    Years since quitting: 1.0  . Smokeless tobacco: Never Used  . Tobacco comment: Pt states vaping every day.  stopped vaping.  Vaping Use  . Vaping Use: Never used  Substance and Sexual Activity  . Alcohol use: Yes    Alcohol/week: 1.0 standard drink    Types: 1 Shots of liquor per week    Comment: occ  . Drug use: No  . Sexual activity: Not Currently  Other Topics Concern  . Not on file  Social History Narrative  . Not on file   Social Determinants of Health   Financial Resource Strain: Not on file  Food Insecurity: Not on file  Transportation Needs: Not on file  Physical Activity: Not on file  Stress: Not on file  Social Connections: Not on file   Family History  Problem Relation Age of Onset  . Irritable bowel syndrome Mother   . Hypertension Mother   . Atrial fibrillation Mother   . Ovarian cancer Maternal Aunt   . Diabetes Father   . Heart attack Father   . Hypertension Father   . Hyperlipidemia Father   . Thyroid disease Sister   . Hyperlipidemia Sister   . Thyroid disease Brother   . Colon polyps Brother   . Stomach cancer Neg Hx   . Colon cancer Neg Hx    Allergies  Allergen Reactions  . Doxycycline     Diarrhea/ nausea   Prior to Admission medications   Medication Sig Start Date End Date Taking? Authorizing Provider  albuterol (VENTOLIN HFA) 108 (90 Base) MCG/ACT  inhaler Inhale 2 puffs into the lungs every 6 (six) hours as needed for wheezing or shortness of breath. 12/05/19   Collene Gobble, MD  ARIPiprazole (ABILIFY) 30 MG tablet Take 30 mg by mouth daily.  08/07/17   [provider]  aspirin EC 81 MG tablet Take 1 tablet (81 mg total) by mouth daily. Swallow whole. 02/07/20   Chandrasekhar, Lyda Kalata A, MD  atorvastatin (LIPITOR) 40 MG tablet Take 2 tablets (80 mg total) by mouth daily. 02/12/20   Chandrasekhar, Terisa Starr, MD  dexlansoprazole (DEXILANT) 60 MG capsule Take 1 capsule (60 mg total) by mouth daily. Office  visit for further refills 07/24/19   Irene Shipper, MD  diclofenac sodium (VOLTAREN) 1 % GEL Apply 2 g topically 4 (four) times daily.  10/24/15   [provider]  fexofenadine (ALLEGRA) 180 MG tablet Take 1 tablet (180 mg total) by mouth daily. 08/21/19   [name], FNP  FLUoxetine (PROZAC) 20 MG capsule Take 20 mg by mouth 2 (two) times daily.  12/01/16   [provider]  fluticasone Asencion Islam) 50 MCG/ACT nasal spray SPRAY TWO SPRAYS IN [facility] NOSTRIL ONCE DAILY 08/21/19   [name], FNP  folic acid (FOLVITE) 1 MG tablet Take 1 tablet (1 mg total) by mouth daily. 03/07/20   Janith Lima, MD  HYDROcodone-acetaminophen (NORCO/VICODIN) 5-325 MG tablet Take 1 tablet by mouth 5 (five) times daily as needed for moderate pain.     [provider]  lamoTRIgine (LAMICTAL) 100 MG tablet Take 100 mg by mouth 2 (two) times daily.     [provider]  losartan (COZAAR) 50 MG tablet Take 1 tablet (50 mg total) by mouth daily. 10/03/19   [name], FNP  magic mouthwash w/lidocaine SOLN Take 5-10 mLs by mouth every 4 (four) hours as needed for mouth pain. Swish and gargle. 04/21/20   Shelda Pal, DO  mesalamine (ROWASA) 4 g enema Place 60 mLs (4 g total) rectally at bedtime. 03/12/20   Irene Shipper, MD  methocarbamol (ROBAXIN) 500 MG tablet Take 500 mg by mouth 2 (two) times  daily as needed for muscle spasms.  06/23/16   [provider]  metoprolol tartrate (LOPRESSOR) 25 MG tablet Take 1 tablet (25 mg total) by mouth 2 (two) times daily. 02/07/20   Werner Lean, MD  montelukast (SINGULAIR) 10 MG tablet TAKE ONE TABLET BY MOUTH AT BEDTIME 10/05/19   Martyn Ehrich, NP  Multiple Vitamin (MULTIVITAMIN WITH MINERALS) TABS tablet Take 1 tablet by mouth daily. 03/15/13   Velvet Bathe, MD  NARCAN 4 MG/0.1ML LIQD nasal spray kit Place 0.4 mg into the nose once.  04/20/17   [provider]  ondansetron (ZOFRAN) 4 MG tablet Take 1 tablet (4 mg total) by mouth every 6 (six) hours as needed for nausea or vomiting. 12/04/19   Collene Gobble, MD  promethazine (PHENERGAN) 25 MG suppository PLACE ONE SUPPOSITORY RECTALLY EVERY 6 HOURS AS NEEDED FOR NAUSEA/VOMITING 04/26/20   Noralyn Pick, NP  Respiratory Therapy Supplies (FLUTTER) DEVI 3 puffs by Does not apply route 4 (four) times daily as needed. Needs to use device 20 min 3-4 times a day for coughing 07/05/19   Martyn Ehrich, NP  thiamine (VITAMIN B-1) 50 MG tablet Take 1 tablet (50 mg total) by mouth daily. 03/12/20   Janith Lima, MD  Tiotropium Bromide Monohydrate (SPIRIVA RESPIMAT) 2.5 MCG/ACT AERS Inhale 2 puffs into the lungs daily. 12/25/19   Martyn Ehrich, NP  traMADol (ULTRAM) 50 MG tablet Take 50 mg by mouth every 8 (eight) hours as needed (break through pain).  08/16/18   [provider]     Positive ROS: Otherwise negative  All other systems have been reviewed and were otherwise negative with the exception of those mentioned in the HPI and as above.  Physical Exam: Constitutional: Alert, well-appearing, no acute distress Ears: External ears without lesions or tenderness. Ear canals are clear bilaterally with intact, clear TMs.  Nasal: External nose without lesions. Septum slightly deviated to the left.  Mild rhinitis with no signs  of infection.. Clear nasal  passages otherwise. Oral: Lips and gums without lesions.  Patient has 2 small sores on the lateral tongue 1 anterior right lateral and the other posterior left lateral.  Both of these areas abut prominent dentition the left posterior adjacent to a left lower back molar in the right anterior adjacent to prominent lower right canine.  She apparently has bitten this area of the tongue previously and it appears that the tongue rubs against the dentition.  On palpation of the sores they are soft and not indurated but are tender.  Tonsil regions appear benign bilaterally. Fiberoptic laryngoscopy was was performed to the right nostril and on fiberoptic laryngoscopy the nasopharynx was clear the base of tongue vallecula epiglottis were normal vocal cords were clear bilaterally.  No evidence of thrush or Candida infection in the hypopharynx. Neck: No palpable adenopathy or masses Respiratory: Breathing comfortably  Skin: No facial/neck lesions or rash noted.  Laryngoscopy  Date/Time: 04/30/2020 12:25 PM Performed by: Rozetta Nunnery, MD Authorized by: Rozetta Nunnery, MD   Consent:    Consent obtained:  Verbal   Consent given by:  Patient Procedure details:    Indications: direct visualization of the upper aerodigestive tract     Medication:  Afrin   Instrument: flexible fiberoptic laryngoscope     Scope location: right nare   Sinus:    Right nasopharynx: normal   Mouth:    Oropharynx: normal     Vallecula: normal     Base of tongue: normal     Epiglottis: normal   Throat:    Pyriform sinus: normal     True vocal cords: normal   Comments:     On fiberoptic laryngoscopy the hypopharynx and larynx is clear with no signs of thrush or infection.    Assessment: Patient with 2 prominent tongue ulcers which I suspect are more traumatic than neoplastic. Presently no clinical evidence of active thrush or infection.  Plan: Recommended using a mouthguard for the lower teeth so as to  prevent trauma to the tongue ulcers or sores and these should heal or improve in the next 2 to 3 weeks.  In the meantime recommended topical anesthetic for control of the pain and discomfort. She will follow-up in a month if the ulcers do not improve. She might obtain a biopsy by oral surgery tomorrow.  Radene Journey, MD

## 2020-04-30 NOTE — Telephone Encounter (Signed)
Please let her know I was out of office last Friday and am just now seeing this message from last week. Has she seen the oral surgeon yet? I was happy to see that the ENT evaluation was reassuring. I do not feel comfortable just sending in Phenergan suppositories. Let's see what the oral surgeon finds and then she may need an OV if she continues to feel that she can't eat.

## 2020-05-01 ENCOUNTER — Encounter: Payer: Self-pay | Admitting: Family

## 2020-05-02 ENCOUNTER — Other Ambulatory Visit: Payer: PPO

## 2020-05-05 ENCOUNTER — Ambulatory Visit: Payer: PPO | Admitting: Family

## 2020-05-05 ENCOUNTER — Other Ambulatory Visit: Payer: Self-pay | Admitting: Nurse Practitioner

## 2020-05-05 NOTE — Telephone Encounter (Signed)
Dr. [name], I am forwarding you this RX request as I have not seen patient in office.  [employer]

## 2020-05-07 ENCOUNTER — Other Ambulatory Visit: Payer: PPO

## 2020-05-07 ENCOUNTER — Ambulatory Visit (INDEPENDENT_AMBULATORY_CARE_PROVIDER_SITE_OTHER): Payer: PPO | Admitting: Otolaryngology

## 2020-05-09 ENCOUNTER — Ambulatory Visit
Admission: RE | Admit: 2020-05-09 | Discharge: 2020-05-09 | Disposition: A | Discharge status: Home / Self Care | Payer: PPO | Source: Ambulatory Visit | Attending: Nurse Practitioner | Admitting: Nurse Practitioner

## 2020-05-09 ENCOUNTER — Ambulatory Visit: Payer: PPO | Admitting: Family

## 2020-05-09 DIAGNOSIS — F319 Bipolar disorder, unspecified: Secondary | ICD-10-CM | POA: Diagnosis not present

## 2020-05-09 DIAGNOSIS — F4310 Post-traumatic stress disorder, unspecified: Secondary | ICD-10-CM | POA: Diagnosis not present

## 2020-05-09 DIAGNOSIS — F419 Anxiety disorder, unspecified: Secondary | ICD-10-CM | POA: Diagnosis not present

## 2020-05-09 DIAGNOSIS — K745 Biliary cirrhosis, unspecified: Secondary | ICD-10-CM

## 2020-05-09 DIAGNOSIS — Z13818 Encounter for screening for other digestive system disorders: Secondary | ICD-10-CM | POA: Diagnosis not present

## 2020-05-12 ENCOUNTER — Ambulatory Visit (INDEPENDENT_AMBULATORY_CARE_PROVIDER_SITE_OTHER): Payer: PPO

## 2020-05-12 ENCOUNTER — Ambulatory Visit: Payer: PPO | Admitting: Endocrinology

## 2020-05-12 ENCOUNTER — Ambulatory Visit (INDEPENDENT_AMBULATORY_CARE_PROVIDER_SITE_OTHER): Payer: PPO | Admitting: Family

## 2020-05-12 ENCOUNTER — Encounter: Payer: Self-pay | Admitting: Family

## 2020-05-12 ENCOUNTER — Ambulatory Visit: Payer: PPO | Admitting: Family

## 2020-05-12 ENCOUNTER — Other Ambulatory Visit: Payer: Self-pay

## 2020-05-12 VITALS — BP 144/70 | HR 93 | Temp 98.2°F | Ht 65.0 in | Wt 240.0 lb

## 2020-05-12 DIAGNOSIS — R635 Abnormal weight gain: Secondary | ICD-10-CM

## 2020-05-12 DIAGNOSIS — R600 Localized edema: Secondary | ICD-10-CM

## 2020-05-12 DIAGNOSIS — R918 Other nonspecific abnormal finding of lung field: Secondary | ICD-10-CM | POA: Diagnosis not present

## 2020-05-12 LAB — CBC WITH DIFFERENTIAL/PLATELET
Basophils Absolute: 0.1 10*3/uL (ref 0.0–0.1)
Basophils Relative: 1.1 % (ref 0.0–3.0)
Eosinophils Absolute: 0.2 10*3/uL (ref 0.0–0.7)
Eosinophils Relative: 2.1 % (ref 0.0–5.0)
HCT: 30.1 % — ABNORMAL LOW (ref 36.0–46.0)
Hemoglobin: 10.4 g/dL — ABNORMAL LOW (ref 12.0–15.0)
Lymphocytes Relative: 23 % (ref 12.0–46.0)
Lymphs Abs: 1.7 10*3/uL (ref 0.7–4.0)
MCHC: 34.5 g/dL (ref 30.0–36.0)
MCV: 108.2 fl — ABNORMAL HIGH (ref 78.0–100.0)
Monocytes Absolute: 0.7 10*3/uL (ref 0.1–1.0)
Monocytes Relative: 8.8 % (ref 3.0–12.0)
Neutro Abs: 4.9 10*3/uL (ref 1.4–7.7)
Neutrophils Relative %: 65 % (ref 43.0–77.0)
Platelets: 130 10*3/uL — ABNORMAL LOW (ref 150.0–400.0)
RBC: 2.78 Mil/uL — ABNORMAL LOW (ref 3.87–5.11)
RDW: 19 % — ABNORMAL HIGH (ref 11.5–15.5)
WBC: 7.5 10*3/uL (ref 4.0–10.5)

## 2020-05-12 LAB — COMPREHENSIVE METABOLIC PANEL
ALT: 66 U/L — ABNORMAL HIGH (ref 0–35)
AST: 286 U/L — ABNORMAL HIGH (ref 0–37)
Albumin: 3 g/dL — ABNORMAL LOW (ref 3.5–5.2)
Alkaline Phosphatase: 328 U/L — ABNORMAL HIGH (ref 39–117)
BUN: 5 mg/dL — ABNORMAL LOW (ref 6–23)
CO2: 29 mEq/L (ref 19–32)
Calcium: 8.8 mg/dL (ref 8.4–10.5)
Chloride: 97 mEq/L (ref 96–112)
Creatinine, Ser: 0.75 mg/dL (ref 0.40–1.20)
GFR: 87.22 mL/min (ref >60.00)
Glucose, Bld: 135 mg/dL — ABNORMAL HIGH (ref 70–99)
Potassium: 4.7 mEq/L (ref 3.5–5.1)
Sodium: 133 mEq/L — ABNORMAL LOW (ref 135–145)
Total Bilirubin: 5.3 mg/dL — ABNORMAL HIGH (ref 0.2–1.2)
Total Protein: 6.8 g/dL (ref 6.0–8.3)

## 2020-05-12 LAB — BRAIN NATRIURETIC PEPTIDE: Pro B Natriuretic peptide (BNP): 228 pg/mL — ABNORMAL HIGH (ref 0.0–100.0)

## 2020-05-12 MED ORDER — FUROSEMIDE 20 MG PO TABS: 20.0000 mg | ORAL_TABLET | Freq: Two times a day (BID) | ORAL | 1 refills | Status: DC | PRN

## 2020-05-12 NOTE — Progress Notes (Signed)
[name] [name] is a 59 y.o. female with the following history as recorded in Epic[employer]:  Patient Active Problem List   Diagnosis Date Noted  . Anemia due to acquired thiamine deficiency 03/12/2020  . Cushingoid facies 03/06/2020  . Deficiency anemia 03/06/2020  . Palpitations 01/03/2020  . Chest pain of uncertain etiology 01/03/2020  . Aortic atherosclerosis (HCC) 01/03/2020  . NAFLD (nonalcoholic fatty liver disease) 01/03/2020  . Pain in left knee 06/12/2019  . COPD, mild (HCC) 11/08/2017  . Iron overload 09/22/2017  . Secondary hemochromatosis 09/22/2017  . Allergic rhinitis 09/05/2017  . Hemoptysis 05/26/2017  . Pulmonary nodule, right 11/29/2016  . Polyarthralgia 12/02/2015  . Varicose veins of both lower extremities 12/02/2015  . Hypertensive retinopathy of both eyes 11/24/2015  . Elevated liver enzymes 10/31/2015  . Hyperglycemia 10/31/2015  . SIRS (systemic inflammatory response syndrome) (HCC) 03/11/2013  . Hypertension   . Hyperlipidemia   . Anemia   . Hematochezia 08/14/2012  . ETOH abuse 08/14/2012  . Pseudocyst of pancreas 08/12/2012  . GERD (gastroesophageal reflux disease) 06/08/2012  . Rectal fissure 05/11/2011  . Rectal fistula 05/11/2011  . Hiatal hernia 04/28/2011  . Gastritis 04/28/2011  . Nausea 04/27/2011  . Acute epigastric pain 04/27/2011  . Obesity, Class III, BMI 40-49.9 (morbid obesity) (HCC) 04/27/2011  . Solitary rectal ulcer 04/12/2011  . Hemorrhoids 03/10/2011  . Ulcerative proctitis, nonspecific (HCC) 03/10/2011  . DIVERTICULOSIS, COLON 10/22/2009  . COLONIC POLYPS, HYPERPLASTIC, HX OF 10/22/2009  . INFECTIOUS DIARRHEA 09/27/2008  . ULCERATIVE PROCTITIS 09/27/2008  . NAUSEA AND VOMITING 09/27/2008  . NAUSEA ALONE 09/27/2008  . DIARRHEA 09/27/2008  . ABDOMINAL PAIN -GENERALIZED 09/27/2008  . DEPRESSION 09/12/2007  . HEMORRHOIDS 09/12/2007  . Diaphragmatic hernia 09/12/2007  . ULCERATIVE COLITIS 09/12/2007  . RECTAL PAIN 09/12/2007   . BIPOLAR AFFECTIVE DISORDER 12/06/2006  . Current every day smoker 12/06/2006  . Esophageal reflux 12/06/2006  . Cough 12/06/2006    Current Outpatient Medications  Medication Sig Dispense Refill  . albuterol (VENTOLIN HFA) 108 (90 Base) MCG/ACT inhaler Inhale 2 puffs into the lungs every 6 (six) hours as needed for wheezing or shortness of breath. 18 g 6  . ARIPiprazole (ABILIFY) 30 MG tablet Take 30 mg by mouth daily.     . aspirin EC 81 MG tablet Take 1 tablet (81 mg total) by mouth daily. Swallow whole. 90 tablet 3  . atorvastatin (LIPITOR) 40 MG tablet Take 2 tablets (80 mg total) by mouth daily. 180 tablet 3  . dexlansoprazole (DEXILANT) 60 MG capsule Take 1 capsule (60 mg total) by mouth daily. Office visit for further refills 30 capsule 2  . diclofenac sodium (VOLTAREN) 1 % GEL Apply 2 g topically 4 (four) times daily as needed (pain).    . fexofenadine (ALLEGRA) 180 MG tablet Take 1 tablet (180 mg total) by mouth daily. 30 tablet 11  . FLUoxetine (PROZAC) 20 MG capsule Take 20 mg by mouth 2 (two) times daily.     . fluticasone (FLONASE) 50 MCG/ACT nasal spray SPRAY TWO SPRAYS IN EACH NOSTRIL ONCE DAILY (Patient taking differently: Place 2 sprays into both nostrils daily. SPRAY TWO SPRAYS IN EACH NOSTRIL ONCE DAILY) 48 g 2  . folic acid (FOLVITE) 1 MG tablet Take 1 tablet (1 mg total) by mouth daily. 90 tablet 1  . furosemide (LASIX) 20 MG tablet Take 1 tablet (20 mg total) by mouth 2 (two) times daily as needed for fluid or edema. 60 tablet 1  . HYDROcodone-acetaminophen (NORCO/VICODIN)   5-325 MG tablet Take 1 tablet by mouth 5 (five) times daily as needed for moderate pain.     . lamoTRIgine (LAMICTAL) 100 MG tablet Take 100 mg by mouth 2 (two) times daily.     . losartan (COZAAR) 50 MG tablet Take 1 tablet (50 mg total) by mouth daily. 90 tablet 1  . magic mouthwash w/lidocaine SOLN Take 5-10 mLs by mouth every 4 (four) hours as needed for mouth pain. Swish and gargle. 300 mL 0  .  mesalamine (ROWASA) 4 g enema Place 60 mLs (4 g total) rectally at bedtime. (Patient taking differently: Place 4 g rectally at bedtime as needed (UC).) 60 mL 30  . methocarbamol (ROBAXIN) 500 MG tablet Take 500 mg by mouth 2 (two) times daily as needed for muscle spasms.     . metoprolol tartrate (LOPRESSOR) 25 MG tablet Take 1 tablet (25 mg total) by mouth 2 (two) times daily. 180 tablet 2  . montelukast (SINGULAIR) 10 MG tablet TAKE ONE TABLET BY MOUTH AT BEDTIME (Patient taking differently: Take 10 mg by mouth at bedtime.) 90 tablet 2  . Multiple Vitamin (MULTIVITAMIN WITH MINERALS) TABS tablet Take 1 tablet by mouth daily. 30 tablet 0  . NARCAN 4 MG/0.1ML LIQD nasal spray kit Place 0.4 mg into the nose once.     . nitrofurantoin (MACRODANTIN) 100 MG capsule Take 100 mg by mouth 2 (two) times daily.    . ondansetron (ZOFRAN) 4 MG tablet Take 1 tablet (4 mg total) by mouth every 6 (six) hours as needed for nausea or vomiting. 30 tablet 0  . promethazine (PHENERGAN) 25 MG suppository INSERT ONE SUPPOSITORY RECTALLY EVERY 6 HOURS AS NEEDED FOR  NAUSEA/ VOMITING 12 suppository 1  . Respiratory Therapy Supplies (FLUTTER) DEVI 3 puffs by Does not apply route 4 (four) times daily as needed. Needs to use device 20 min 3-4 times a day for coughing 1 each 0  . tamsulosin (FLOMAX) 0.4 MG CAPS capsule Take 0.4 mg by mouth daily.    . thiamine (VITAMIN B-1) 50 MG tablet Take 1 tablet (50 mg total) by mouth daily. 90 tablet 1  . Tiotropium Bromide Monohydrate (SPIRIVA RESPIMAT) 2.5 MCG/ACT AERS Inhale 2 puffs into the lungs daily. 4 g 11  . traMADol (ULTRAM) 50 MG tablet Take 50 mg by mouth every 8 (eight) hours as needed (break through pain).     . vitamin B-12 (CYANOCOBALAMIN) 1000 MCG tablet Take 1,000 mcg by mouth daily.     No current facility-administered medications for this visit.    Allergies: Doxycycline  Past Medical History:  Diagnosis Date  . ABDOMINAL PAIN -GENERALIZED 09/27/2008   Qualifier:  Diagnosis of  By: Esterwood PA-c, Amy S   . Acute epigastric pain 04/27/2011  . Acute pancreatitis 08/12/2012  . Allergic rhinitis 09/05/2017  . Anal fistula   . Anemia   . Anxiety   . Arthritis   . BIPOLAR AFFECTIVE DISORDER 12/06/2006   Qualifier: History of  By: Alva MD, Rakesh V.    . Bipolar disorder (HCC)   . Chronic kidney disease   . COPD (chronic obstructive pulmonary disease) (HCC) 11/08/2017  . Cough 12/06/2006   Annotation: chronic since'96, nml spirometry, +ve methacholine challenge, nml  CT sinuses, inconclusive pH probe, esophageal manometry Centricity Description: SYMPTOM, COUGH Qualifier: History of  By: Alva MD, Rakesh V.   Centricity Description: COUGH Qualifier: Diagnosis of  By: Alva MD, Rakesh V.    . Depressive disorder, not elsewhere classified   .   Diarrhea 09/27/2008   Qualifier: Diagnosis of  By: Esterwood PA-c, Amy S   . Diverticulosis of colon (without mention of hemorrhage)   . Elevated liver enzymes 10/31/2015  . Esophageal reflux   . ETOH abuse 08/14/2012  . Fatty liver   . Gastritis 04/28/2011  . GERD (gastroesophageal reflux disease)   . Hematochezia 08/14/2012  . Hemoptysis 05/26/2017  . HEMORRHOIDS 09/12/2007   Qualifier: Diagnosis of  By: Kowalk CMA (AAMA), Leisha    . Hepatic steatosis   . Hiatal hernia   . HIATAL HERNIA 09/12/2007   Qualifier: Diagnosis of  By: Kowalk CMA (AAMA), Leisha    . History of recurrent UTIs   . Hyperlipidemia   . Hypertension   . Hypertensive retinopathy of both eyes 11/24/2015  . INFECTIOUS DIARRHEA 09/27/2008   Qualifier: Diagnosis of  By: Peterman NCMA, Pam    . Iron deficiency   . Iron overload 09/22/2017  . Nausea alone 09/27/2008   Qualifier: Diagnosis of  By: Peterman NCMA, Pam    . NAUSEA AND VOMITING 09/27/2008   Qualifier: Diagnosis of  By: Esterwood PA-c, Amy S   . Obesity   . Obesity, Class III, BMI 40-49.9 (morbid obesity) (HCC) 04/27/2011  . Pancreatitis   . Personal history of colonic polyps 10/16/2009   hyperplastic   . Polyarthralgia 12/02/2015   Evaluated by Dr. Truslow (rheumatologist) 12/29/2015:1): 1)possible fibromyalgia (Suggest physical therapy and flexeril), 2) Polyarthralgia/Insomnia (suggest use of robaxin or flexeril), 3) Nicotine Dependence (counseled to quit). 4)Depression (continue therapy with psychiatrist)  . Pre-diabetes   . Pseudocyst of pancreas 08/12/2012  . Pulmonary nodule, right 11/29/2016  . Rectal fissure 05/11/2011  . Rectal fistula 05/11/2011  . RECTAL PAIN 09/12/2007   Qualifier: Diagnosis of  By: Kowalk CMA (AAMA), Leisha    . Secondary hemochromatosis 09/22/2017  . Seizures (HCC)   . SIRS (systemic inflammatory response syndrome) (HCC) 03/11/2013  . Solitary rectal ulcer 04/12/2011  . TOBACCO ABUSE 12/06/2006   Qualifier: History of  By: Alva MD, Rakesh V.    . Ulcerative colitis, unspecified   . Ulcerative proctitis, nonspecific (HCC) 03/10/2011  . Varicose veins of both lower extremities 12/02/2015    Past Surgical History:  Procedure Laterality Date  . FINGER AMPUTATION  2010   right index  . HEMORROIDECTOMY    . KNEE ARTHROSCOPY     bilateral, left x2  . TOTAL ABDOMINAL HYSTERECTOMY    . VIDEO BRONCHOSCOPY Bilateral 05/31/2017   Procedure: VIDEO BRONCHOSCOPY WITHOUT FLUORO;  Surgeon: Byrum, Robert S, MD;  Location: [facility];  Service: Cardiopulmonary;  Laterality: Bilateral;    Family History  Problem Relation Age of Onset  . Irritable bowel syndrome Mother   . Hypertension Mother   . Atrial fibrillation Mother   . Ovarian cancer Maternal Aunt   . Diabetes Father   . Heart attack Father   . Hypertension Father   . Hyperlipidemia Father   . Thyroid disease Sister   . Hyperlipidemia Sister   . Thyroid disease Brother   . Colon polyps Brother   . Stomach cancer Neg Hx   . Colon cancer Neg Hx     Social History   Tobacco Use  . Smoking status: Former Smoker    Packs/day: 0.25    Years: 46.00    Pack years: 11.50    Types: Cigars, Cigarettes    Quit  date: 04/23/2019    Years since quitting: 1.0  . Smokeless tobacco: Never Used  . Tobacco comment: Pt   states vaping every day.  stopped vaping.  Substance Use Topics  . Alcohol use: Yes    Alcohol/week: 1.0 standard drink    Types: 1 Shots of liquor per week    Comment: occ    Subjective:   Bilateral swelling in lower extremities and feels that abdomen is swollen as well; is surprised to see her weight is up 20 pounds- admits that has not been eating due to mouth sores and can't determine how she actually gained weight;  Had u/s last Friday with her liver specialist- did show ascites in all 4 quadrants; has not discussed follow-up there yet;   Objective:  Vitals:   05/12/20 1541  BP: (!) 144/70  Pulse: 93  Temp: 98.2 F (36.8 C)  TempSrc: Oral  SpO2: 93%  Weight: 240 lb (108.9 kg)  Height: 5' 5" (1.651 m)    General: Well developed, well nourished, in no acute distress  Skin : Warm and dry. Pale appearing Head: Normocephalic and atraumatic  Eyes: Sclera and conjunctiva clear; pupils round and reactive to light; extraocular movements intact  Ears: External normal; canals clear; tympanic membranes normal  Oropharynx: Pink, supple. No suspicious lesions  Neck: Supple without thyromegaly, adenopathy  Lungs: Respirations unlabored; clear to auscultation bilaterally without wheeze, rales, rhonchi  CVS exam: normal rate and regular rhythm.  Extremities: bilateral pitting edema, cyanosis, clubbing  Vessels: Symmetric bilaterally  Neurologic: Alert and oriented; speech intact; face symmetrical; using walker   Assessment:  1. Pedal edema   2. Weight gain     Plan:  Update CXR and labs today; concern for source of extensive amount of weight gain- will most likely need abdominal/ pelvic CT or MRI; Start Lasix 20 mg bid prn for now; follow-up to be determined;  This visit occurred during the SARS-CoV-2 public health emergency.  Safety protocols were in place, including screening  questions prior to the visit, additional usage of staff PPE, and extensive cleaning of exam room while observing appropriate contact time as indicated for disinfecting solutions.      No follow-ups on file.  Orders Placed This Encounter  Procedures  . DG Chest 2 View    Standing Status:   Future    Number of Occurrences:   1    Standing Expiration Date:   05/12/2021    Order Specific Question:   Reason for Exam (SYMPTOM  OR DIAGNOSIS REQUIRED)    Answer:   pedal edema    Order Specific Question:   Is patient pregnant?    Answer:   No    Order Specific Question:   Preferred imaging location?    Answer:   [facility]  . Brain natriuretic peptide    Standing Status:   Future    Number of Occurrences:   1    Standing Expiration Date:   05/12/2021  . CBC with Differential/Platelet    Standing Status:   Future    Number of Occurrences:   1    Standing Expiration Date:   05/12/2021  . Comp Met (CMET)    Standing Status:   Future    Number of Occurrences:   1    Standing Expiration Date:   05/12/2021    Requested Prescriptions   Signed Prescriptions Disp Refills  . furosemide (LASIX) 20 MG tablet 60 tablet 1    Sig: Take 1 tablet (20 mg total) by mouth 2 (two) times daily as needed for fluid or edema.     

## 2020-05-13 ENCOUNTER — Telehealth: Payer: Self-pay | Admitting: Family

## 2020-05-13 ENCOUNTER — Encounter (HOSPITAL_COMMUNITY): Payer: Self-pay

## 2020-05-13 ENCOUNTER — Other Ambulatory Visit: Payer: Self-pay

## 2020-05-13 ENCOUNTER — Inpatient Hospital Stay (HOSPITAL_COMMUNITY)
Admission: EM | Admit: 2020-05-13 | Discharge: 2020-05-23 | DRG: 441 | Disposition: A | Discharge status: Home Health | Payer: PPO | Attending: Internal Medicine | Admitting: Internal Medicine

## 2020-05-13 DIAGNOSIS — E8779 Other fluid overload: Secondary | ICD-10-CM

## 2020-05-13 DIAGNOSIS — R040 Epistaxis: Secondary | ICD-10-CM | POA: Diagnosis not present

## 2020-05-13 DIAGNOSIS — I5032 Chronic diastolic (congestive) heart failure: Secondary | ICD-10-CM | POA: Diagnosis present

## 2020-05-13 DIAGNOSIS — Z8349 Family history of other endocrine, nutritional and metabolic diseases: Secondary | ICD-10-CM

## 2020-05-13 DIAGNOSIS — K7040 Alcoholic hepatic failure without coma: Secondary | ICD-10-CM | POA: Diagnosis present

## 2020-05-13 DIAGNOSIS — H35033 Hypertensive retinopathy, bilateral: Secondary | ICD-10-CM | POA: Diagnosis not present

## 2020-05-13 DIAGNOSIS — F1010 Alcohol abuse, uncomplicated: Secondary | ICD-10-CM | POA: Diagnosis not present

## 2020-05-13 DIAGNOSIS — R609 Edema, unspecified: Secondary | ICD-10-CM

## 2020-05-13 DIAGNOSIS — F319 Bipolar disorder, unspecified: Secondary | ICD-10-CM | POA: Diagnosis not present

## 2020-05-13 DIAGNOSIS — Z833 Family history of diabetes mellitus: Secondary | ICD-10-CM

## 2020-05-13 DIAGNOSIS — Z83438 Family history of other disorder of lipoprotein metabolism and other lipidemia: Secondary | ICD-10-CM

## 2020-05-13 DIAGNOSIS — F419 Anxiety disorder, unspecified: Secondary | ICD-10-CM | POA: Diagnosis not present

## 2020-05-13 DIAGNOSIS — K7460 Unspecified cirrhosis of liver: Secondary | ICD-10-CM

## 2020-05-13 DIAGNOSIS — J440 Chronic obstructive pulmonary disease with acute lower respiratory infection: Secondary | ICD-10-CM | POA: Diagnosis not present

## 2020-05-13 DIAGNOSIS — I5031 Acute diastolic (congestive) heart failure: Secondary | ICD-10-CM | POA: Diagnosis present

## 2020-05-13 DIAGNOSIS — R601 Generalized edema: Secondary | ICD-10-CM | POA: Diagnosis present

## 2020-05-13 DIAGNOSIS — Z8041 Family history of malignant neoplasm of ovary: Secondary | ICD-10-CM

## 2020-05-13 DIAGNOSIS — Z6841 Body Mass Index (BMI) 40.0 and over, adult: Secondary | ICD-10-CM

## 2020-05-13 DIAGNOSIS — K7011 Alcoholic hepatitis with ascites: Secondary | ICD-10-CM | POA: Diagnosis present

## 2020-05-13 DIAGNOSIS — F1020 Alcohol dependence, uncomplicated: Secondary | ICD-10-CM

## 2020-05-13 DIAGNOSIS — K7200 Acute and subacute hepatic failure without coma: Secondary | ICD-10-CM | POA: Diagnosis not present

## 2020-05-13 DIAGNOSIS — I517 Cardiomegaly: Secondary | ICD-10-CM | POA: Diagnosis not present

## 2020-05-13 DIAGNOSIS — N289 Disorder of kidney and ureter, unspecified: Secondary | ICD-10-CM | POA: Diagnosis not present

## 2020-05-13 DIAGNOSIS — K7469 Other cirrhosis of liver: Secondary | ICD-10-CM | POA: Diagnosis not present

## 2020-05-13 DIAGNOSIS — R0602 Shortness of breath: Secondary | ICD-10-CM

## 2020-05-13 DIAGNOSIS — K767 Hepatorenal syndrome: Principal | ICD-10-CM | POA: Diagnosis present

## 2020-05-13 DIAGNOSIS — K760 Fatty (change of) liver, not elsewhere classified: Secondary | ICD-10-CM | POA: Diagnosis not present

## 2020-05-13 DIAGNOSIS — Z8249 Family history of ischemic heart disease and other diseases of the circulatory system: Secondary | ICD-10-CM

## 2020-05-13 DIAGNOSIS — Z881 Allergy status to other antibiotic agents status: Secondary | ICD-10-CM

## 2020-05-13 DIAGNOSIS — J449 Chronic obstructive pulmonary disease, unspecified: Secondary | ICD-10-CM | POA: Diagnosis not present

## 2020-05-13 DIAGNOSIS — I700 Atherosclerosis of aorta: Secondary | ICD-10-CM | POA: Diagnosis not present

## 2020-05-13 DIAGNOSIS — K219 Gastro-esophageal reflux disease without esophagitis: Secondary | ICD-10-CM | POA: Diagnosis present

## 2020-05-13 DIAGNOSIS — I110 Hypertensive heart disease with heart failure: Secondary | ICD-10-CM | POA: Diagnosis present

## 2020-05-13 DIAGNOSIS — Z87891 Personal history of nicotine dependence: Secondary | ICD-10-CM

## 2020-05-13 DIAGNOSIS — K7031 Alcoholic cirrhosis of liver with ascites: Secondary | ICD-10-CM | POA: Diagnosis not present

## 2020-05-13 DIAGNOSIS — Z8371 Family history of colonic polyps: Secondary | ICD-10-CM

## 2020-05-13 DIAGNOSIS — E876 Hypokalemia: Secondary | ICD-10-CM | POA: Diagnosis not present

## 2020-05-13 DIAGNOSIS — K3189 Other diseases of stomach and duodenum: Secondary | ICD-10-CM | POA: Diagnosis not present

## 2020-05-13 DIAGNOSIS — E785 Hyperlipidemia, unspecified: Secondary | ICD-10-CM | POA: Diagnosis not present

## 2020-05-13 DIAGNOSIS — N179 Acute kidney failure, unspecified: Secondary | ICD-10-CM | POA: Diagnosis present

## 2020-05-13 DIAGNOSIS — E8770 Fluid overload, unspecified: Secondary | ICD-10-CM | POA: Diagnosis not present

## 2020-05-13 DIAGNOSIS — J189 Pneumonia, unspecified organism: Secondary | ICD-10-CM | POA: Diagnosis not present

## 2020-05-13 DIAGNOSIS — G9341 Metabolic encephalopathy: Secondary | ICD-10-CM | POA: Diagnosis not present

## 2020-05-13 DIAGNOSIS — I10 Essential (primary) hypertension: Secondary | ICD-10-CM | POA: Diagnosis not present

## 2020-05-13 DIAGNOSIS — R188 Other ascites: Secondary | ICD-10-CM | POA: Diagnosis not present

## 2020-05-13 DIAGNOSIS — R042 Hemoptysis: Secondary | ICD-10-CM | POA: Diagnosis not present

## 2020-05-13 DIAGNOSIS — D539 Nutritional anemia, unspecified: Secondary | ICD-10-CM | POA: Diagnosis not present

## 2020-05-13 DIAGNOSIS — Z20822 Contact with and (suspected) exposure to covid-19: Secondary | ICD-10-CM | POA: Diagnosis not present

## 2020-05-13 DIAGNOSIS — J9601 Acute respiratory failure with hypoxia: Secondary | ICD-10-CM | POA: Diagnosis not present

## 2020-05-13 DIAGNOSIS — R918 Other nonspecific abnormal finding of lung field: Secondary | ICD-10-CM | POA: Diagnosis not present

## 2020-05-13 DIAGNOSIS — G8929 Other chronic pain: Secondary | ICD-10-CM | POA: Diagnosis present

## 2020-05-13 DIAGNOSIS — Z79899 Other long term (current) drug therapy: Secondary | ICD-10-CM

## 2020-05-13 DIAGNOSIS — Z09 Encounter for follow-up examination after completed treatment for conditions other than malignant neoplasm: Secondary | ICD-10-CM

## 2020-05-13 DIAGNOSIS — F10239 Alcohol dependence with withdrawal, unspecified: Secondary | ICD-10-CM | POA: Diagnosis present

## 2020-05-13 DIAGNOSIS — F10288 Alcohol dependence with other alcohol-induced disorder: Secondary | ICD-10-CM | POA: Diagnosis not present

## 2020-05-13 DIAGNOSIS — K121 Other forms of stomatitis: Secondary | ICD-10-CM | POA: Diagnosis present

## 2020-05-13 DIAGNOSIS — K7689 Other specified diseases of liver: Secondary | ICD-10-CM | POA: Diagnosis not present

## 2020-05-13 DIAGNOSIS — L439 Lichen planus, unspecified: Secondary | ICD-10-CM | POA: Diagnosis present

## 2020-05-13 DIAGNOSIS — Z7982 Long term (current) use of aspirin: Secondary | ICD-10-CM

## 2020-05-13 LAB — URINALYSIS, ROUTINE W REFLEX MICROSCOPIC
Glucose, UA: 50 mg/dL — AB
Hgb urine dipstick: NEGATIVE
Ketones, ur: NEGATIVE mg/dL
Leukocytes,Ua: NEGATIVE
Nitrite: NEGATIVE
Protein, ur: 30 mg/dL — AB
Specific Gravity, Urine: 1.021 (ref 1.005–1.030)
pH: 5 (ref 5.0–8.0)

## 2020-05-13 LAB — COMPREHENSIVE METABOLIC PANEL
ALT: 65 U/L — ABNORMAL HIGH (ref 0–44)
AST: 269 U/L — ABNORMAL HIGH (ref 15–41)
Albumin: 2.6 g/dL — ABNORMAL LOW (ref 3.5–5.0)
Alkaline Phosphatase: 310 U/L — ABNORMAL HIGH (ref 38–126)
Anion gap: 11 (ref 5–15)
BUN: <5 mg/dL — ABNORMAL LOW (ref 6–20)
CO2: 24 mmol/L (ref 22–32)
Calcium: 8.8 mg/dL — ABNORMAL LOW (ref 8.9–10.3)
Chloride: 102 mmol/L (ref 98–111)
Creatinine, Ser: 0.84 mg/dL (ref 0.44–1.00)
GFR, Estimated: >60 mL/min (ref >60)
Glucose, Bld: 111 mg/dL — ABNORMAL HIGH (ref 70–99)
Potassium: 4.5 mmol/L (ref 3.5–5.1)
Sodium: 137 mmol/L (ref 135–145)
Total Bilirubin: 5.5 mg/dL — ABNORMAL HIGH (ref 0.3–1.2)
Total Protein: 6.8 g/dL (ref 6.5–8.1)

## 2020-05-13 LAB — CBC
HCT: 28.4 % — ABNORMAL LOW (ref 36.0–46.0)
HCT: 27.5 % — ABNORMAL LOW (ref 36.0–46.0)
Hemoglobin: 9.6 g/dL — ABNORMAL LOW (ref 12.0–15.0)
Hemoglobin: 9.5 g/dL — ABNORMAL LOW (ref 12.0–15.0)
MCH: 36.1 pg — ABNORMAL HIGH (ref 26.0–34.0)
MCH: 36.7 pg — ABNORMAL HIGH (ref 26.0–34.0)
MCHC: 33.8 g/dL (ref 30.0–36.0)
MCHC: 34.5 g/dL (ref 30.0–36.0)
MCV: 106.8 fL — ABNORMAL HIGH (ref 80.0–100.0)
MCV: 106.2 fL — ABNORMAL HIGH (ref 80.0–100.0)
Platelets: 170 10*3/uL (ref 150–400)
Platelets: 174 10*3/uL (ref 150–400)
RBC: 2.66 MIL/uL — ABNORMAL LOW (ref 3.87–5.11)
RBC: 2.59 MIL/uL — ABNORMAL LOW (ref 3.87–5.11)
RDW: 21.6 % — ABNORMAL HIGH (ref 11.5–15.5)
RDW: 21.2 % — ABNORMAL HIGH (ref 11.5–15.5)
WBC: 6.6 10*3/uL (ref 4.0–10.5)
WBC: 7.2 10*3/uL (ref 4.0–10.5)
nRBC: 0 % (ref 0.0–0.2)
nRBC: 0 % (ref 0.0–0.2)

## 2020-05-13 LAB — IRON AND TIBC
Iron: 82 ug/dL (ref 28–170)
Saturation Ratios: 33 % — ABNORMAL HIGH (ref 10.4–31.8)
TIBC: 252 ug/dL (ref 250–450)
UIBC: 170 ug/dL

## 2020-05-13 LAB — GAMMA GT: GGT: 624 U/L — ABNORMAL HIGH (ref 7–50)

## 2020-05-13 LAB — CREATININE, SERUM
Creatinine, Ser: 1.1 mg/dL — ABNORMAL HIGH (ref 0.44–1.00)
GFR, Estimated: 58 mL/min — ABNORMAL LOW (ref >60)

## 2020-05-13 LAB — BILIRUBIN, FRACTIONATED(TOT/DIR/INDIR)
Bilirubin, Direct: 2.9 mg/dL — ABNORMAL HIGH (ref 0.0–0.2)
Indirect Bilirubin: 2.5 mg/dL — ABNORMAL HIGH (ref 0.3–0.9)
Total Bilirubin: 5.4 mg/dL — ABNORMAL HIGH (ref 0.3–1.2)

## 2020-05-13 LAB — MAGNESIUM: Magnesium: 1.6 mg/dL — ABNORMAL LOW (ref 1.7–2.4)

## 2020-05-13 LAB — VITAMIN B12: Vitamin B-12: 1855 pg/mL — ABNORMAL HIGH (ref 180–914)

## 2020-05-13 LAB — PHOSPHORUS: Phosphorus: 3.7 mg/dL (ref 2.5–4.6)

## 2020-05-13 LAB — HIV ANTIBODY (ROUTINE TESTING W REFLEX): HIV Screen 4th Generation wRfx: NONREACTIVE

## 2020-05-13 LAB — FOLATE: Folate: 9 ng/mL (ref >5.9)

## 2020-05-13 LAB — LIPASE, BLOOD: Lipase: 24 U/L (ref 11–51)

## 2020-05-13 MED ORDER — MONTELUKAST SODIUM 10 MG PO TABS
10.0000 mg | ORAL_TABLET | Freq: Every day | ORAL | Status: DC
  Administered 2020-05-13 – 2020-05-22 (×10): 10 mg via ORAL
  Filled 2020-05-13 (×10): qty 1

## 2020-05-13 MED ORDER — FLUTICASONE PROPIONATE 50 MCG/ACT NA SUSP
2.0000 | Freq: Every day | NASAL | Status: DC
  Administered 2020-05-13 – 2020-05-22 (×12): 2 via NASAL
  Filled 2020-05-13: qty 16

## 2020-05-13 MED ORDER — LORATADINE 10 MG PO TABS
10.0000 mg | ORAL_TABLET | Freq: Every day | ORAL | Status: DC
  Administered 2020-05-13 – 2020-05-23 (×11): 10 mg via ORAL
  Filled 2020-05-13 (×11): qty 1

## 2020-05-13 MED ORDER — TIOTROPIUM BROMIDE MONOHYDRATE 2.5 MCG/ACT IN AERS: 2.0000 | INHALATION_SPRAY | Freq: Every day | RESPIRATORY_TRACT | Status: DC

## 2020-05-13 MED ORDER — LORAZEPAM 2 MG/ML IJ SOLN
1.0000 mg | INTRAMUSCULAR | Status: AC | PRN
Start: 2020-05-13 — End: 2020-05-16
  Administered 2020-05-13 – 2020-05-15 (×4): 1 mg via INTRAVENOUS
  Administered 2020-05-15: 2 mg via INTRAVENOUS
  Administered 2020-05-16 (×2): 1 mg via INTRAVENOUS
  Filled 2020-05-13 (×8): qty 1

## 2020-05-13 MED ORDER — FUROSEMIDE 10 MG/ML IJ SOLN
40.0000 mg | Freq: Every day | INTRAMUSCULAR | Status: DC
  Administered 2020-05-13 – 2020-05-14 (×2): 40 mg via INTRAVENOUS
  Filled 2020-05-13 (×2): qty 4

## 2020-05-13 MED ORDER — FLUOXETINE HCL 20 MG PO CAPS
20.0000 mg | ORAL_CAPSULE | Freq: Two times a day (BID) | ORAL | Status: DC
  Administered 2020-05-13 – 2020-05-23 (×20): 20 mg via ORAL
  Filled 2020-05-13 (×20): qty 1

## 2020-05-13 MED ORDER — PANTOPRAZOLE SODIUM 40 MG PO TBEC
40.0000 mg | DELAYED_RELEASE_TABLET | Freq: Every day | ORAL | Status: DC
  Administered 2020-05-13 – 2020-05-23 (×11): 40 mg via ORAL
  Filled 2020-05-13 (×11): qty 1

## 2020-05-13 MED ORDER — OXYCODONE HCL 5 MG PO TABS
5.0000 mg | ORAL_TABLET | Freq: Once | ORAL | Status: AC
Start: 2020-05-13 — End: 2020-05-13
  Administered 2020-05-13: 5 mg via ORAL
  Filled 2020-05-13: qty 1

## 2020-05-13 MED ORDER — ARIPIPRAZOLE 10 MG PO TABS
30.0000 mg | ORAL_TABLET | Freq: Every day | ORAL | Status: DC
  Administered 2020-05-13 – 2020-05-23 (×11): 30 mg via ORAL
  Filled 2020-05-13 (×9): qty 3
  Filled 2020-05-13: qty 6
  Filled 2020-05-13: qty 3

## 2020-05-13 MED ORDER — THIAMINE HCL 100 MG PO TABS
100.0000 mg | ORAL_TABLET | Freq: Every day | ORAL | Status: DC
  Administered 2020-05-13 – 2020-05-23 (×11): 100 mg via ORAL
  Filled 2020-05-13 (×11): qty 1

## 2020-05-13 MED ORDER — ALBUTEROL SULFATE HFA 108 (90 BASE) MCG/ACT IN AERS
2.0000 | INHALATION_SPRAY | Freq: Four times a day (QID) | RESPIRATORY_TRACT | Status: DC | PRN
  Administered 2020-05-17 – 2020-05-20 (×2): 2 via RESPIRATORY_TRACT
  Filled 2020-05-13: qty 6.7

## 2020-05-13 MED ORDER — ALBUMIN HUMAN 5 % IV SOLN
25.0000 g | INTRAVENOUS | Status: AC
  Administered 2020-05-14: 25 g via INTRAVENOUS
  Filled 2020-05-13: qty 500

## 2020-05-13 MED ORDER — LAMOTRIGINE 100 MG PO TABS
100.0000 mg | ORAL_TABLET | Freq: Every day | ORAL | Status: DC
  Administered 2020-05-13 – 2020-05-22 (×10): 100 mg via ORAL
  Filled 2020-05-13 (×10): qty 1

## 2020-05-13 MED ORDER — ENOXAPARIN SODIUM 40 MG/0.4ML ~~LOC~~ SOLN
40.0000 mg | SUBCUTANEOUS | Status: DC
  Administered 2020-05-13 – 2020-05-14 (×2): 40 mg via SUBCUTANEOUS
  Filled 2020-05-13 (×2): qty 0.4

## 2020-05-13 MED ORDER — ONDANSETRON HCL 4 MG/2ML IJ SOLN
4.0000 mg | Freq: Three times a day (TID) | INTRAMUSCULAR | Status: DC | PRN
  Administered 2020-05-13 – 2020-05-14 (×2): 4 mg via INTRAVENOUS
  Filled 2020-05-13 (×2): qty 2

## 2020-05-13 MED ORDER — THIAMINE HCL 100 MG/ML IJ SOLN
100.0000 mg | Freq: Every day | INTRAMUSCULAR | Status: DC
  Filled 2020-05-13: qty 2

## 2020-05-13 MED ORDER — FOLIC ACID 1 MG PO TABS
1.0000 mg | ORAL_TABLET | Freq: Every day | ORAL | Status: DC
  Administered 2020-05-13 – 2020-05-23 (×11): 1 mg via ORAL
  Filled 2020-05-13 (×11): qty 1

## 2020-05-13 MED ORDER — ADULT MULTIVITAMIN W/MINERALS CH
1.0000 | ORAL_TABLET | Freq: Every day | ORAL | Status: DC
  Administered 2020-05-13 – 2020-05-23 (×11): 1 via ORAL
  Filled 2020-05-13 (×11): qty 1

## 2020-05-13 MED ORDER — LORAZEPAM 1 MG PO TABS
1.0000 mg | ORAL_TABLET | ORAL | Status: AC | PRN
Start: 2020-05-13 — End: 2020-05-16
  Administered 2020-05-15 – 2020-05-16 (×2): 1 mg via ORAL
  Filled 2020-05-13: qty 1

## 2020-05-13 MED ORDER — UMECLIDINIUM BROMIDE 62.5 MCG/INH IN AEPB
1.0000 | INHALATION_SPRAY | Freq: Every day | RESPIRATORY_TRACT | Status: DC
  Administered 2020-05-14 – 2020-05-23 (×9): 1 via RESPIRATORY_TRACT
  Filled 2020-05-13 (×2): qty 7

## 2020-05-13 MED ORDER — ASPIRIN EC 81 MG PO TBEC
81.0000 mg | DELAYED_RELEASE_TABLET | Freq: Every day | ORAL | Status: DC
  Administered 2020-05-13 – 2020-05-23 (×11): 81 mg via ORAL
  Filled 2020-05-13 (×11): qty 1

## 2020-05-13 NOTE — Progress Notes (Signed)
Reviewed labs and findings from [place] yesterday with patient's liver specialist; both of [place] in agreement that patient needs to be seen at ER for treatment/ admit;   I spoke to patient regarding this treatment plan and she is in agreement; she will plan to go to [place] today as soon as her transportation can get to her house ( per patient, ride is on the way while we are talking); she will plan to follow up with her liver specialist as directed after discharge;

## 2020-05-13 NOTE — ED Provider Notes (Signed)
[facility] DEPT Provider Note   CSN: [id number] Arrival date & time: 05/13/20  1234     History Chief Complaint  Patient presents with  . Abnormal Lab    [name] [name] is a 60 y.o. female.  [name] [name] has a longstanding history of alcohol use disorder.  She drinks up to a pint of liquor per day sometimes more.  She has had periods of sobriety for up to 10 years at a time, but she has been drinking heavily for the most recent time.  She saw her doctor for some lower extremity swelling that has been progressive for 3 to 4 months.  She has simultaneously gained about 20 pounds.  Her doctor checked labs yesterday, and she sent her to the emergency department for evaluation.  Patient also had a right upper quadrant ultrasound a few days ago.  It revealed ascites without evidence of biliary obstruction.   The history is provided by the patient.  Abnormal Lab Time since result:  1 day Patient referred by:  PCP Resulting agency:  Internal Result type: LFT/amylase/lipase        Past Medical History:  Diagnosis Date  . ABDOMINAL PAIN -GENERALIZED 09/27/2008   Qualifier: Diagnosis of  By: Trellis Paganini PA-c, Amy S   . Acute epigastric pain 04/27/2011  . Acute pancreatitis 08/12/2012  . Allergic rhinitis 09/05/2017  . Anal fistula   . Anemia   . Anxiety   . Arthritis   . BIPOLAR AFFECTIVE DISORDER 12/06/2006   Qualifier: History of  By: Elsworth Soho MD, Leanna Sato Bipolar disorder ([place])   . Chronic kidney disease   . COPD (chronic obstructive pulmonary disease) ([place]) 11/08/2017  . Cough 12/06/2006   Annotation: chronic since'96, nml spirometry, +ve methacholine challenge, nml  CT sinuses, inconclusive pH probe, esophageal manometry Centricity Description: SYMPTOM, COUGH Qualifier: History of  By: Elsworth Soho MD, Leanna Sato   Centricity Description: COUGH Qualifier: Diagnosis of  By: Elsworth Soho MD, Leanna Sato    . Depressive disorder, not elsewhere classified   . Diarrhea  09/27/2008   Qualifier: Diagnosis of  By: Trellis Paganini PA-c, Amy S   . Diverticulosis of colon (without mention of hemorrhage)   . Elevated liver enzymes 10/31/2015  . Esophageal reflux   . ETOH abuse 08/14/2012  . Fatty liver   . Gastritis 04/28/2011  . GERD (gastroesophageal reflux disease)   . Hematochezia 08/14/2012  . Hemoptysis 05/26/2017  . HEMORRHOIDS 09/12/2007   Qualifier: Diagnosis of  By: Nils Pyle CMA ([place]), Mearl Latin    . Hepatic steatosis   . Hiatal hernia   . HIATAL HERNIA 09/12/2007   Qualifier: Diagnosis of  By: Nils Pyle CMA ([place]), Mearl Latin    . History of recurrent UTIs   . Hyperlipidemia   . Hypertension   . Hypertensive retinopathy of both eyes 11/24/2015  . INFECTIOUS DIARRHEA 09/27/2008   Qualifier: Diagnosis of  By: Laney Potash, Pam    . Iron deficiency   . Iron overload 09/22/2017  . Nausea alone 09/27/2008   Qualifier: Diagnosis of  By: Laney Potash, Velma 09/27/2008   Qualifier: Diagnosis of  By: Shane Crutch, Amy S   . Obesity   . Obesity, Class III, BMI 40-49.9 (morbid obesity) ([place]) 04/27/2011  . Pancreatitis   . Personal history of colonic polyps 10/16/2009   hyperplastic  . Polyarthralgia 12/02/2015   Evaluated by Dr. Charlestine Night (rheumatologist) 12/29/2015:1): 1)possible fibromyalgia (Suggest physical therapy and flexeril),  2) Polyarthralgia/Insomnia (suggest use of robaxin or flexeril), 3) Nicotine Dependence (counseled to quit). 4)Depression (continue therapy with psychiatrist)  . Pre-diabetes   . Pseudocyst of pancreas 08/12/2012  . Pulmonary nodule, right 11/29/2016  . Rectal fissure 05/11/2011  . Rectal fistula 05/11/2011  . RECTAL PAIN 09/12/2007   Qualifier: Diagnosis of  By: Nils Pyle CMA ([place]), Mearl Latin    . Secondary hemochromatosis 09/22/2017  . Seizures ([place])   . SIRS (systemic inflammatory response syndrome) ([place]) 03/11/2013  . Solitary rectal ulcer 04/12/2011  . TOBACCO ABUSE 12/06/2006   Qualifier: History of  By: Elsworth Soho MD, Leanna Sato.    .  Ulcerative colitis, unspecified   . Ulcerative proctitis, nonspecific ([place]) 03/10/2011  . Varicose veins of both lower extremities 12/02/2015    Patient Active Problem List   Diagnosis Date Noted  . Anemia due to acquired thiamine deficiency 03/12/2020  . Cushingoid facies 03/06/2020  . Deficiency anemia 03/06/2020  . Palpitations 01/03/2020  . Chest pain of uncertain etiology 41/93/7902  . Aortic atherosclerosis ([place]) 01/03/2020  . NAFLD (nonalcoholic fatty liver disease) 01/03/2020  . Pain in left knee 06/12/2019  . COPD, mild ([place]) 11/08/2017  . Iron overload 09/22/2017  . Secondary hemochromatosis 09/22/2017  . Allergic rhinitis 09/05/2017  . Hemoptysis 05/26/2017  . Pulmonary nodule, right 11/29/2016  . Polyarthralgia 12/02/2015  . Varicose veins of both lower extremities 12/02/2015  . Hypertensive retinopathy of both eyes 11/24/2015  . Elevated liver enzymes 10/31/2015  . Hyperglycemia 10/31/2015  . SIRS (systemic inflammatory response syndrome) ([place]) 03/11/2013  . Hypertension   . Hyperlipidemia   . Anemia   . Hematochezia 08/14/2012  . ETOH abuse 08/14/2012  . Pseudocyst of pancreas 08/12/2012  . GERD (gastroesophageal reflux disease) 06/08/2012  . Rectal fissure 05/11/2011  . Rectal fistula 05/11/2011  . Hiatal hernia 04/28/2011  . Gastritis 04/28/2011  . Nausea 04/27/2011  . Acute epigastric pain 04/27/2011  . Obesity, Class III, BMI 40-49.9 (morbid obesity) ([place]) 04/27/2011  . Solitary rectal ulcer 04/12/2011  . Hemorrhoids 03/10/2011  . Ulcerative proctitis, nonspecific ([place]) 03/10/2011  . DIVERTICULOSIS, COLON 10/22/2009  . COLONIC POLYPS, HYPERPLASTIC, HX OF 10/22/2009  . INFECTIOUS DIARRHEA 09/27/2008  . ULCERATIVE PROCTITIS 09/27/2008  . NAUSEA AND VOMITING 09/27/2008  . NAUSEA ALONE 09/27/2008  . DIARRHEA 09/27/2008  . ABDOMINAL PAIN -GENERALIZED 09/27/2008  . DEPRESSION 09/12/2007  . HEMORRHOIDS 09/12/2007  . Diaphragmatic hernia 09/12/2007  .  ULCERATIVE COLITIS 09/12/2007  . RECTAL PAIN 09/12/2007  . BIPOLAR AFFECTIVE DISORDER 12/06/2006  . Current every day smoker 12/06/2006  . Esophageal reflux 12/06/2006  . Cough 12/06/2006    Past Surgical History:  Procedure Laterality Date  . FINGER AMPUTATION  2010   right index  . HEMORROIDECTOMY    . KNEE ARTHROSCOPY     bilateral, left x2  . TOTAL ABDOMINAL HYSTERECTOMY    . VIDEO BRONCHOSCOPY Bilateral 05/31/2017   Procedure: VIDEO BRONCHOSCOPY WITHOUT FLUORO;  Surgeon: Collene Gobble, MD;  Location: [facility];  Service: Cardiopulmonary;  Laterality: Bilateral;     OB History   No obstetric history on file.     Family History  Problem Relation Age of Onset  . Irritable bowel syndrome Mother   . Hypertension Mother   . Atrial fibrillation Mother   . Ovarian cancer Maternal Aunt   . Diabetes Father   . Heart attack Father   . Hypertension Father   . Hyperlipidemia Father   . Thyroid disease Sister   . Hyperlipidemia Sister   .  Thyroid disease Brother   . Colon polyps Brother   . Stomach cancer Neg Hx   . Colon cancer Neg Hx     Social History   Tobacco Use  . Smoking status: Former Smoker    Packs/day: 0.25    Years: 46.00    Pack years: 11.50    Types: Cigars, Cigarettes    Quit date: 04/23/2019    Years since quitting: 1.0  . Smokeless tobacco: Never Used  . Tobacco comment: Pt states vaping every day.  stopped vaping.  Vaping Use  . Vaping Use: Never used  Substance Use Topics  . Alcohol use: Yes    Alcohol/week: 1.0 standard drink    Types: 1 Shots of liquor per week    Comment: occ  . Drug use: No    Home Medications Prior to Admission medications   Medication Sig Start Date End Date Taking? Authorizing Provider  albuterol (VENTOLIN HFA) 108 (90 Base) MCG/ACT inhaler Inhale 2 puffs into the lungs every 6 (six) hours as needed for wheezing or shortness of breath. 12/05/19   Collene Gobble, MD  ARIPiprazole (ABILIFY) 30 MG tablet Take 30 mg  by mouth daily.  08/07/17   [provider]  aspirin EC 81 MG tablet Take 1 tablet (81 mg total) by mouth daily. Swallow whole. 02/07/20   Chandrasekhar, Lyda Kalata A, MD  atorvastatin (LIPITOR) 40 MG tablet Take 2 tablets (80 mg total) by mouth daily. 02/12/20   Chandrasekhar, Terisa Starr, MD  dexlansoprazole (DEXILANT) 60 MG capsule Take 1 capsule (60 mg total) by mouth daily. Office visit for further refills 07/24/19   Irene Shipper, MD  diclofenac sodium (VOLTAREN) 1 % GEL Apply 2 g topically 4 (four) times daily as needed (pain). 10/24/15   [provider]  fexofenadine (ALLEGRA) 180 MG tablet Take 1 tablet (180 mg total) by mouth daily. 08/21/19   [name], FNP  FLUoxetine (PROZAC) 20 MG capsule Take 20 mg by mouth 2 (two) times daily.  12/01/16   [provider]  fluticasone (FLONASE) 50 MCG/ACT nasal spray SPRAY TWO SPRAYS IN EACH NOSTRIL ONCE DAILY Patient taking differently: Place 2 sprays into both nostrils daily. SPRAY TWO SPRAYS IN [facility] NOSTRIL ONCE DAILY 08/21/19   [name], FNP  folic acid (FOLVITE) 1 MG tablet Take 1 tablet (1 mg total) by mouth daily. 03/07/20   Janith Lima, MD  furosemide (LASIX) 20 MG tablet Take 1 tablet (20 mg total) by mouth 2 (two) times daily as needed for fluid or edema. 05/12/20   [name], FNP  HYDROcodone-acetaminophen (NORCO/VICODIN) 5-325 MG tablet Take 1 tablet by mouth 5 (five) times daily as needed for moderate pain.     [provider]  lamoTRIgine (LAMICTAL) 100 MG tablet Take 100 mg by mouth 2 (two) times daily.     [provider]  losartan (COZAAR) 50 MG tablet Take 1 tablet (50 mg total) by mouth daily. 10/03/19   [name], FNP  magic mouthwash w/lidocaine SOLN Take 5-10 mLs by mouth every 4 (four) hours as needed for mouth pain. Swish and gargle. 04/21/20   Shelda Pal, DO  mesalamine (ROWASA) 4 g enema Place 60 mLs (4 g total) rectally at  bedtime. Patient taking differently: Place 4 g rectally at bedtime as needed (UC). 03/12/20   Irene Shipper, MD  methocarbamol (ROBAXIN) 500 MG tablet Take 500 mg by mouth 2 (two) times daily as needed for muscle  spasms.  06/23/16   [provider]  metoprolol tartrate (LOPRESSOR) 25 MG tablet Take 1 tablet (25 mg total) by mouth 2 (two) times daily. 02/07/20   Chandrasekhar, Mahesh A, MD  montelukast (SINGULAIR) 10 MG tablet TAKE ONE TABLET BY MOUTH AT BEDTIME Patient taking differently: Take 10 mg by mouth at bedtime. 10/05/19   Martyn Ehrich, NP  Multiple Vitamin (MULTIVITAMIN WITH MINERALS) TABS tablet Take 1 tablet by mouth daily. 03/15/13   Velvet Bathe, MD  NARCAN 4 MG/0.1ML LIQD nasal spray kit Place 0.4 mg into the nose once.  04/20/17   [provider]  nitrofurantoin (MACRODANTIN) 100 MG capsule Take 100 mg by mouth 2 (two) times daily.    [provider]  ondansetron (ZOFRAN) 4 MG tablet Take 1 tablet (4 mg total) by mouth every 6 (six) hours as needed for nausea or vomiting. 12/04/19   Collene Gobble, MD  promethazine (PHENERGAN) 25 MG suppository INSERT ONE SUPPOSITORY RECTALLY EVERY 6 HOURS AS NEEDED FOR  NAUSEA/ VOMITING 05/06/20   Irene Shipper, MD  Respiratory Therapy Supplies (FLUTTER) DEVI 3 puffs by Does not apply route 4 (four) times daily as needed. Needs to use device 20 min 3-4 times a day for coughing 07/05/19   Martyn Ehrich, NP  tamsulosin (FLOMAX) 0.4 MG CAPS capsule Take 0.4 mg by mouth daily. 04/22/20   [provider]  thiamine (VITAMIN B-1) 50 MG tablet Take 1 tablet (50 mg total) by mouth daily. 03/12/20   Janith Lima, MD  Tiotropium Bromide Monohydrate (SPIRIVA RESPIMAT) 2.5 MCG/ACT AERS Inhale 2 puffs into the lungs daily. 12/25/19   Martyn Ehrich, NP  traMADol (ULTRAM) 50 MG tablet Take 50 mg by mouth every 8 (eight) hours as needed (break through pain).  08/16/18   [provider]  vitamin B-12 (CYANOCOBALAMIN)  1000 MCG tablet Take 1,000 mcg by mouth daily.    [provider]    Allergies    Doxycycline  Review of Systems   Review of Systems  Constitutional: Negative for chills and fever.  HENT: Negative for ear pain and sore throat.   Eyes: Negative for pain and visual disturbance.  Respiratory: Negative for cough and shortness of breath.   Cardiovascular: Negative for chest pain and palpitations.  Gastrointestinal: Negative for abdominal pain and vomiting.  Genitourinary: Negative for dysuria and hematuria.  Musculoskeletal: Negative for arthralgias and back pain.  Skin: Negative for color change and rash.  Neurological: Negative for seizures and syncope.  All other systems reviewed and are negative.   Physical Exam Updated Vital Signs BP (!) 110/56   Pulse 69   Temp 98.1 F (36.7 C) (Oral)   Resp 18   Ht $R'5\' 5"'gb$  (1.651 m)   Wt 108.9 kg   SpO2 92%   BMI 39.94 kg/m   Physical Exam Vitals and nursing note reviewed.  Constitutional:      General: She is not in acute distress.    Appearance: She is well-developed.  HENT:     Head: Normocephalic and atraumatic.     Nose: Nose normal.     Mouth/Throat:     Comments: The patient has shallow, superficial ulcers on both sides of her tongue.  The left is worse on the right. Eyes:     Conjunctiva/sclera: Conjunctivae normal.     Comments: Scleral icterus bilaterally.  Cardiovascular:     Rate and Rhythm: Normal rate and regular rhythm.     Heart sounds:  No murmur heard.   Pulmonary:     Effort: Pulmonary effort is normal. No respiratory distress.     Breath sounds: Normal breath sounds.  Abdominal:     General: There is distension.     Palpations: Abdomen is soft.     Tenderness: There is no abdominal tenderness.     Comments: Large ascites  Musculoskeletal:     Cervical back: Normal range of motion and neck supple.  Skin:    General: Skin is warm and dry.     Coloration: Skin is jaundiced.  Neurological:      Mental Status: She is alert.     ED Results / Procedures / Treatments   Labs (all labs ordered are listed, but only abnormal results are displayed) Labs Reviewed  COMPREHENSIVE METABOLIC PANEL - Abnormal; Notable for the following components:      Result Value   Glucose, Bld 111 (*)    BUN <5 (*)    Calcium 8.8 (*)    Albumin 2.6 (*)    AST 269 (*)    ALT 65 (*)    Alkaline Phosphatase 310 (*)    Total Bilirubin 5.5 (*)    All other components within normal limits  CBC - Abnormal; Notable for the following components:   RBC 2.66 (*)    Hemoglobin 9.6 (*)    HCT 28.4 (*)    MCV 106.8 (*)    MCH 36.1 (*)    RDW 21.6 (*)    All other components within normal limits  URINALYSIS, ROUTINE W REFLEX MICROSCOPIC - Abnormal; Notable for the following components:   Color, Urine AMBER (*)    APPearance TURBID (*)    Glucose, UA 50 (*)    Bilirubin Urine SMALL (*)    Protein, ur 30 (*)    Bacteria, UA RARE (*)    All other components within normal limits  CBC - Abnormal; Notable for the following components:   RBC 2.59 (*)    Hemoglobin 9.5 (*)    HCT 27.5 (*)    MCV 106.2 (*)    MCH 36.7 (*)    RDW 21.2 (*)    All other components within normal limits  CREATININE, SERUM - Abnormal; Notable for the following components:   Creatinine, Ser 1.10 (*)    GFR, Estimated 58 (*)    All other components within normal limits  COMPREHENSIVE METABOLIC PANEL - Abnormal; Notable for the following components:   Potassium 5.4 (*)    Creatinine, Ser 1.38 (*)    Calcium 8.5 (*)    Total Protein 6.2 (*)    Albumin 2.5 (*)    AST 251 (*)    ALT 56 (*)    Alkaline Phosphatase 286 (*)    Total Bilirubin 5.9 (*)    GFR, Estimated 44 (*)    All other components within normal limits  CBC - Abnormal; Notable for the following components:   RBC 2.66 (*)    Hemoglobin 9.8 (*)    HCT 28.8 (*)    MCV 108.3 (*)    MCH 36.8 (*)    RDW 21.6 (*)    All other components within normal limits  IRON  AND TIBC - Abnormal; Notable for the following components:   Saturation Ratios 33 (*)    All other components within normal limits  VITAMIN B12 - Abnormal; Notable for the following components:   Vitamin B-12 1,855 (*)    All other components within normal limits  MAGNESIUM - Abnormal; Notable for the following components:   Magnesium 1.6 (*)    All other components within normal limits  BILIRUBIN, FRACTIONATED(TOT/DIR/INDIR) - Abnormal; Notable for the following components:   Total Bilirubin 5.4 (*)    Bilirubin, Direct 2.9 (*)    Indirect Bilirubin 2.5 (*)    All other components within normal limits  GAMMA GT - Abnormal; Notable for the following components:   GGT 624 (*)    All other components within normal limits  SARS CORONAVIRUS 2 (TAT 6-24 HRS)  LIPASE, BLOOD  HIV ANTIBODY (ROUTINE TESTING W REFLEX)  FOLATE  PHOSPHORUS  VITAMIN B1  HEPATITIS PANEL, ACUTE    EKG None  Radiology DG Chest 2 View  Result Date: 05/13/2020 CLINICAL DATA:  Pedal edema.  Bilateral foot swelling for 2 weeks. EXAM: CHEST - 2 VIEW COMPARISON:  Included portion from coronary CT 02/06/2020, chest radiograph 08/21/2019 FINDINGS: Left costophrenic angle excluded from the field of view. Lower lung volumes from prior exam. There is diffuse interstitial and bronchial thickening. Superimposed patchy airspace opacity in the left greater than right perihilar lungs. The heart is normal in size. Unchanged mediastinal contours. No significant pleural effusion. No pneumothorax. No acute osseous abnormalities are seen. IMPRESSION: 1. Diffuse interstitial and bronchial thickening which may be due to bronchial inflammation or congestive. 2. Additional patchy bilateral perihilar opacities, more typical of multifocal infection. Recommend correlation for infectious symptoms. Electronically Signed   By: Keith Rake M.D.   On: 05/13/2020 17:51    Procedures Procedures   Medications Ordered in ED Medications - No  data to display  ED Course  I have reviewed the triage vital signs and the nursing notes.  Pertinent labs & imaging results that were available during my care of the patient were reviewed by me and considered in my medical decision making (see chart for details).  Clinical Course as of 05/13/20 1559  Tue May 13, 2020  1559 I spoke with Nevin Bloodgood with [facility] and Dr. Marylyn Ishihara with [facility]. [AW]    Clinical Course User Index [AW] Arnaldo Natal, MD   MDM Rules/Calculators/A&P                          [name] [name] presents upon referral by her PCP for elevated liver enzymes.  She has a longstanding history of alcohol use disorder, and this is likely contributed to her liver failure.  She is overall well-appearing, and her symptoms have been progressive over several months.  No evidence today hepatorenal syndrome, SBP, or acute obstruction.  She will be admitted for inpatient evaluation because she is still actively drinking.  GI feels this would be the most prudent approach.  Care was coordinated with the hospitalist service as well.  Imaging was not obtained in the ED because she had an ultrasound a few days ago. Final Clinical Impression(s) / ED Diagnoses Final diagnoses:  Acute liver failure without hepatic coma  Alcohol use disorder, severe, dependence ([place])    Rx / DC Orders ED Discharge Orders    None       Arnaldo Natal, MD 05/14/20 0730

## 2020-05-13 NOTE — ED Triage Notes (Addendum)
Patient states she was told to come to the ED for elevated LFT's. Patient also reports that she had very little urine at 0745 this AM and states she has medicine that she takes "to help her urinate", but did not take because the doctor's office told her to "go to the ED and they would take care of that too."  Patienat has swelling to lower extremities R>L.  Patient states she normally drinks a half pint of liquor daily, but had half a shot glass of liquor yesterday. Patient states she did have a seizure in 2017 when she went through alcohol detox.

## 2020-05-13 NOTE — ED Notes (Signed)
Report called to [facility] on 4W

## 2020-05-13 NOTE — H&P (Signed)
History and Physical    [name] [name] PYP:[id number] DOB: 1960/07/17 DOA: 05/13/2020  PCP: Olive Bass, FNP  Patient coming from: Home  Chief Complaint: leg and stomach swelling  HPI: [name] [name] is a 60 y.o. female with medical history significant of EtOH abuse, anxiety/depression, HTN. Presenting with BLE edema and stomach swelling. She reports that she's been slowly swelling over the last few months. She's gained at least 20 lbs in fluid weight during that time. She was sent for an abdominal US by her PCP a few days ago.It showed moderate 4 quadrant ascites. Her PCP prescribed lasix for her, but she has not picked it up. She talked again with her PCP who recommended that she come to the ED for evaluation. She denies any other aggravating or alleviating factors.   ED Course: She was found to be volume overloaded. LFTs were elevated. GI was consulted. TR[facility] was called for admission.   Review of Systems:  Denies CP, palpitations, dyspnea, V/D, fever. Reports heavy drinking, N, ab tightness. Review of systems is otherwise negative for all not mentioned in HPI.   PMHx Past Medical History:  Diagnosis Date  . ABDOMINAL PAIN -GENERALIZED 09/27/2008   Qualifier: Diagnosis of  By: Monica Becton PA-c, Amy S   . Acute epigastric pain 04/27/2011  . Acute pancreatitis 08/12/2012  . Allergic rhinitis 09/05/2017  . Anal fistula   . Anemia   . Anxiety   . Arthritis   . BIPOLAR AFFECTIVE DISORDER 12/06/2006   Qualifier: History of  By: Vassie Loll MD, [place] Bipolar disorder (HCC)   . Chronic kidney disease   . COPD (chronic obstructive pulmonary disease) (HCC) 11/08/2017  . Cough 12/06/2006   Annotation: chronic since'96, nml spirometry, +ve methacholine challenge, nml  CT sinuses, inconclusive pH probe, esophageal manometry Centricity Description: SYMPTOM, COUGH Qualifier: History of  By: Vassie Loll MD, [place]er Locket   Centricity Description: COUGH Qualifier: Diagnosis of  By: Vassie Loll MD, [place]    .  Depressive disorder, not elsewhere classified   . Diarrhea 09/27/2008   Qualifier: Diagnosis of  By: Monica Becton PA-c, Amy S   . Diverticulosis of colon (without mention of hemorrhage)   . Elevated liver enzymes 10/31/2015  . Esophageal reflux   . ETOH abuse 08/14/2012  . Fatty liver   . Gastritis 04/28/2011  . GERD (gastroesophageal reflux disease)   . Hematochezia 08/14/2012  . Hemoptysis 05/26/2017  . HEMORRHOIDS 09/12/2007   Qualifier: Diagnosis of  By: Koleen Distance CMA (AAMA), Hulan Saas    . Hepatic steatosis   . Hiatal hernia   . HIATAL HERNIA 09/12/2007   Qualifier: Diagnosis of  By: Koleen Distance CMA (AAMA), Hulan Saas    . History of recurrent UTIs   . Hyperlipidemia   . Hypertension   . Hypertensive retinopathy of both eyes 11/24/2015  . INFECTIOUS DIARRHEA 09/27/2008   Qualifier: Diagnosis of  By: Dorian Pod, Pam    . Iron deficiency   . Iron overload 09/22/2017  . Nausea alone 09/27/2008   Qualifier: Diagnosis of  By: Dorian Pod, Daine Floras AND VOMITING 09/27/2008   Qualifier: Diagnosis of  By: Myrtie Hawk, Amy S   . Obesity   . Obesity, Class III, BMI 40-49.9 (morbid obesity) (HCC) 04/27/2011  . Pancreatitis   . Personal history of colonic polyps 10/16/2009   hyperplastic  . Polyarthralgia 12/02/2015   Evaluated by Dr. Kellie Simmering (rheumatologist) 12/29/2015:1): 1)possible fibromyalgia (Suggest physical therapy and flexeril), 2) Polyarthralgia/Insomnia (suggest use  of robaxin or flexeril), 3) Nicotine Dependence (counseled to quit). 4)Depression (continue therapy with psychiatrist)  . Pre-diabetes   . Pseudocyst of pancreas 08/12/2012  . Pulmonary nodule, right 11/29/2016  . Rectal fissure 05/11/2011  . Rectal fistula 05/11/2011  . RECTAL PAIN 09/12/2007   Qualifier: Diagnosis of  By: Nils Pyle CMA ([place]), Mearl Latin    . Secondary hemochromatosis 09/22/2017  . Seizures ([place])   . SIRS (systemic inflammatory response syndrome) ([place]) 03/11/2013  . Solitary rectal ulcer 04/12/2011  . TOBACCO ABUSE 12/06/2006    Qualifier: History of  By: Elsworth Soho MD, Leanna Sato.    . Ulcerative colitis, unspecified   . Ulcerative proctitis, nonspecific ([place]) 03/10/2011  . Varicose veins of both lower extremities 12/02/2015    PSHx Past Surgical History:  Procedure Laterality Date  . FINGER AMPUTATION  2010   right index  . HEMORROIDECTOMY    . KNEE ARTHROSCOPY     bilateral, left x2  . TOTAL ABDOMINAL HYSTERECTOMY    . VIDEO BRONCHOSCOPY Bilateral 05/31/2017   Procedure: VIDEO BRONCHOSCOPY WITHOUT FLUORO;  Surgeon: Collene Gobble, MD;  Location: [facility];  Service: Cardiopulmonary;  Laterality: Bilateral;    SocHx  reports that she quit smoking about 12 months ago. Her smoking use included cigars and cigarettes. She has a 11.50 pack-year smoking history. She has never used smokeless tobacco. She reports current alcohol use of about 1.0 standard drink of alcohol per week. She reports that she does not use drugs.  Allergies  Allergen Reactions  . Doxycycline     Diarrhea/ nausea    FamHx Family History  Problem Relation Age of Onset  . Irritable bowel syndrome Mother   . Hypertension Mother   . Atrial fibrillation Mother   . Ovarian cancer Maternal Aunt   . Diabetes Father   . Heart attack Father   . Hypertension Father   . Hyperlipidemia Father   . Thyroid disease Sister   . Hyperlipidemia Sister   . Thyroid disease Brother   . Colon polyps Brother   . Stomach cancer Neg Hx   . Colon cancer Neg Hx     Prior to Admission medications   Medication Sig Start Date End Date Taking? Authorizing Provider  albuterol (VENTOLIN HFA) 108 (90 Base) MCG/ACT inhaler Inhale 2 puffs into the lungs every 6 (six) hours as needed for wheezing or shortness of breath. 12/05/19   Collene Gobble, MD  ARIPiprazole (ABILIFY) 30 MG tablet Take 30 mg by mouth daily.  08/07/17   [provider]  aspirin EC 81 MG tablet Take 1 tablet (81 mg total) by mouth daily. Swallow whole. 02/07/20   Chandrasekhar, Lyda Kalata  A, MD  atorvastatin (LIPITOR) 40 MG tablet Take 2 tablets (80 mg total) by mouth daily. 02/12/20   Chandrasekhar, Terisa Starr, MD  dexlansoprazole (DEXILANT) 60 MG capsule Take 1 capsule (60 mg total) by mouth daily. Office visit for further refills 07/24/19   Irene Shipper, MD  diclofenac sodium (VOLTAREN) 1 % GEL Apply 2 g topically 4 (four) times daily as needed (pain). 10/24/15   [provider]  fexofenadine (ALLEGRA) 180 MG tablet Take 1 tablet (180 mg total) by mouth daily. 08/21/19   [name], FNP  FLUoxetine (PROZAC) 20 MG capsule Take 20 mg by mouth 2 (two) times daily.  12/01/16   [provider]  fluticasone (FLONASE) 50 MCG/ACT nasal spray SPRAY TWO SPRAYS IN EACH NOSTRIL ONCE DAILY Patient taking differently: Place 2 sprays into both nostrils  daily. SPRAY TWO SPRAYS IN [facility] NOSTRIL ONCE DAILY 08/21/19   [name], FNP  folic acid (FOLVITE) 1 MG tablet Take 1 tablet (1 mg total) by mouth daily. 03/07/20   Janith Lima, MD  furosemide (LASIX) 20 MG tablet Take 1 tablet (20 mg total) by mouth 2 (two) times daily as needed for fluid or edema. 05/12/20   [name], FNP  HYDROcodone-acetaminophen (NORCO/VICODIN) 5-325 MG tablet Take 1 tablet by mouth 5 (five) times daily as needed for moderate pain.     [provider]  lamoTRIgine (LAMICTAL) 100 MG tablet Take 100 mg by mouth 2 (two) times daily.     [provider]  losartan (COZAAR) 50 MG tablet Take 1 tablet (50 mg total) by mouth daily. 10/03/19   [name], FNP  magic mouthwash w/lidocaine SOLN Take 5-10 mLs by mouth every 4 (four) hours as needed for mouth pain. Swish and gargle. 04/21/20   Shelda Pal, DO  mesalamine (ROWASA) 4 g enema Place 60 mLs (4 g total) rectally at bedtime. Patient taking differently: Place 4 g rectally at bedtime as needed (UC). 03/12/20   Irene Shipper, MD  methocarbamol (ROBAXIN) 500 MG tablet Take 500 mg by mouth 2  (two) times daily as needed for muscle spasms.  06/23/16   [provider]  metoprolol tartrate (LOPRESSOR) 25 MG tablet Take 1 tablet (25 mg total) by mouth 2 (two) times daily. 02/07/20   Chandrasekhar, Mahesh A, MD  montelukast (SINGULAIR) 10 MG tablet TAKE ONE TABLET BY MOUTH AT BEDTIME Patient taking differently: Take 10 mg by mouth at bedtime. 10/05/19   Martyn Ehrich, NP  Multiple Vitamin (MULTIVITAMIN WITH MINERALS) TABS tablet Take 1 tablet by mouth daily. 03/15/13   Velvet Bathe, MD  NARCAN 4 MG/0.1ML LIQD nasal spray kit Place 0.4 mg into the nose once.  04/20/17   [provider]  nitrofurantoin (MACRODANTIN) 100 MG capsule Take 100 mg by mouth 2 (two) times daily.    [provider]  ondansetron (ZOFRAN) 4 MG tablet Take 1 tablet (4 mg total) by mouth every 6 (six) hours as needed for nausea or vomiting. 12/04/19   Collene Gobble, MD  promethazine (PHENERGAN) 25 MG suppository INSERT ONE SUPPOSITORY RECTALLY EVERY 6 HOURS AS NEEDED FOR  NAUSEA/ VOMITING 05/06/20   Irene Shipper, MD  Respiratory Therapy Supplies (FLUTTER) DEVI 3 puffs by Does not apply route 4 (four) times daily as needed. Needs to use device 20 min 3-4 times a day for coughing 07/05/19   Martyn Ehrich, NP  tamsulosin (FLOMAX) 0.4 MG CAPS capsule Take 0.4 mg by mouth daily. 04/22/20   [provider]  thiamine (VITAMIN B-1) 50 MG tablet Take 1 tablet (50 mg total) by mouth daily. 03/12/20   Janith Lima, MD  Tiotropium Bromide Monohydrate (SPIRIVA RESPIMAT) 2.5 MCG/ACT AERS Inhale 2 puffs into the lungs daily. 12/25/19   Martyn Ehrich, NP  traMADol (ULTRAM) 50 MG tablet Take 50 mg by mouth every 8 (eight) hours as needed (break through pain).  08/16/18   [provider]  vitamin B-12 (CYANOCOBALAMIN) 1000 MCG tablet Take 1,000 mcg by mouth daily.    [provider]    Physical Exam: Vitals:   05/13/20 1246 05/13/20 1253 05/13/20 1434 05/13/20 1520  BP:   131/77 (!) 110/56 112/64  Pulse:  83 69 72  Resp:   18 18  Temp:  TempSrc:      SpO2:   92% 96%  Weight: 108.9 kg     Height: $Remove'5\' 5"'FhwhIAY$  (1.651 m)       General: 60 y.o. female resting in bed in NAD Eyes: PERRL, slightly ichteric sclera ENMT: Nares patent w/o discharge, orophaynx clear, dentition normal, ears w/o discharge/lesions/ulcers Neck: Supple, trachea midline Cardiovascular: RRR, +S1, S2, no m/g/r, equal pulses throughout Respiratory: CTABL, no w/r/r, normal WOB GI: BS+, obese, distended, soft, NT, no masses noted, no organomegaly noted MSK: No c/c; BLE 3/4+ pitting Skin: No rashes, bruises, ulcerations noted Neuro: A&O x 3, no focal deficits Psyc: Appropriate interaction and affect, calm/cooperative  Labs on Admission: I have personally reviewed following labs and imaging studies  CBC: Recent Labs  Lab 05/12/20 1605 05/13/20 1346  WBC 7.5 6.6  NEUTROABS 4.9  --   HGB 10.4* 9.6*  HCT 30.1* 28.4*  MCV 108.2* 106.8*  PLT 130.0* 161   Basic Metabolic Panel: Recent Labs  Lab 05/12/20 1605 05/13/20 1346  NA 133* 137  K 4.7 4.5  CL 97 102  CO2 29 24  GLUCOSE 135* 111*  BUN 5* <5*  CREATININE 0.75 0.84  CALCIUM 8.8 8.8*   GFR: Estimated Creatinine Clearance: 88.6 mL/min (by C-G formula based on SCr of 0.84 mg/dL). Liver Function Tests: Recent Labs  Lab 05/12/20 1605 05/13/20 1346  AST 286* 269*  ALT 66* 65*  ALKPHOS 328* 310*  BILITOT 5.3* 5.5*  PROT 6.8 6.8  ALBUMIN 3.0* 2.6*   Recent Labs  Lab 05/13/20 1346  LIPASE 24   No results for input(s): AMMONIA in the last 168 hours. Coagulation Profile: No results for input(s): INR, PROTIME in the last 168 hours. Cardiac Enzymes: No results for input(s): CKTOTAL, CKMB, CKMBINDEX, TROPONINI in the last 168 hours. BNP (last 3 results) Recent Labs    05/12/20 1605  PROBNP 228.0*   HbA1C: No results for input(s): HGBA1C in the last 72 hours. CBG: No results for input(s): GLUCAP in the last 168  hours. Lipid Profile: No results for input(s): CHOL, HDL, LDLCALC, TRIG, CHOLHDL, LDLDIRECT in the last 72 hours. Thyroid Function Tests: No results for input(s): TSH, T4TOTAL, FREET4, T3FREE, THYROIDAB in the last 72 hours. Anemia Panel: No results for input(s): VITAMINB12, FOLATE, FERRITIN, TIBC, IRON, RETICCTPCT in the last 72 hours. Urine analysis:    Component Value Date/Time   COLORURINE ORANGE (A) 08/08/2015 0314   APPEARANCEUR TURBID (A) 08/08/2015 0314   LABSPEC 1.026 08/08/2015 0314   PHURINE 5.0 08/08/2015 0314   GLUCOSEU NEGATIVE 08/08/2015 0314   GLUCOSEU NEG mg/dL 03/10/2009 2104   HGBUR LARGE (A) 08/08/2015 0314   BILIRUBINUR negative 11/01/2017 1216   KETONESUR 40 (A) 08/08/2015 0314   PROTEINUR Positive (A) 11/01/2017 1216   PROTEINUR >300 (A) 08/08/2015 0314   UROBILINOGEN 0.2 11/01/2017 1216   UROBILINOGEN 0.2 03/11/2013 1933   NITRITE negative 11/01/2017 1216   NITRITE POSITIVE (A) 08/08/2015 0314   LEUKOCYTESUR 4+ (A) 11/01/2017 1216    Radiological Exams on Admission: No results found.  Assessment/Plan Volume overload Elevated LFTs Hx of cirrhosis     - admit to inpt, tele     - she just had an ultrasound on the 18th; it didn't give the clearest show of the liver     - will start lasix IV, add spironolactone as tolerated; may need to try a push and pull with albumin     - [facility] to see, will defer any further imaging to them     -  will consult IR for paracentesis; no current evidence of SBP so no abx for right now     - check hepatitis panel, ggt, fractionated bili     - check CXR     - BNP was mildly elevated yesterday; check echo  EtOH abuse     - CIWA protocol     - thiamine, folate, MVI daily   Macrocytic anemia     - check B12, THF, iron studies     - no evidence of bleed, follow  HLD     - hold statin d/t elevated LFTs  HTN     - currently normotensive     - will be started on lasix; will ass spironolactone as able; start back home  regimen as tolerated  DVT prophylaxis: lovenox  Code Status: FULL  Family Communication: None at bedside  Consults called: EDP spoke with [facility]   Status is: Inpatient  Remains inpatient appropriate because:Inpatient level of care appropriate due to severity of illness   Dispo: The patient is from: Home              Anticipated d/c is to: Home              Patient currently is not medically stable to d/c.   Difficult to place patient No  Time spent coordinating admission: 75 minutes  [facility]  If 7PM-7AM, please contact night-coverage [email or web address]  05/13/2020, 3:47 PM

## 2020-05-13 NOTE — Telephone Encounter (Signed)
Provider has called pt and given her the lab results and the next steps.

## 2020-05-13 NOTE — Telephone Encounter (Signed)
    Please call patient to discuss lab results 

## 2020-05-14 ENCOUNTER — Inpatient Hospital Stay (HOSPITAL_COMMUNITY): Payer: PPO

## 2020-05-14 ENCOUNTER — Encounter (HOSPITAL_COMMUNITY): Payer: Self-pay | Admitting: Internal Medicine

## 2020-05-14 ENCOUNTER — Telehealth: Payer: Self-pay

## 2020-05-14 ENCOUNTER — Other Ambulatory Visit (HOSPITAL_COMMUNITY): Payer: PPO

## 2020-05-14 DIAGNOSIS — K7031 Alcoholic cirrhosis of liver with ascites: Secondary | ICD-10-CM | POA: Diagnosis not present

## 2020-05-14 DIAGNOSIS — F1020 Alcohol dependence, uncomplicated: Secondary | ICD-10-CM | POA: Diagnosis not present

## 2020-05-14 LAB — COMPREHENSIVE METABOLIC PANEL
ALT: 56 U/L — ABNORMAL HIGH (ref 0–44)
AST: 251 U/L — ABNORMAL HIGH (ref 15–41)
Albumin: 2.5 g/dL — ABNORMAL LOW (ref 3.5–5.0)
Alkaline Phosphatase: 286 U/L — ABNORMAL HIGH (ref 38–126)
Anion gap: 9 (ref 5–15)
BUN: 9 mg/dL (ref 6–20)
CO2: 25 mmol/L (ref 22–32)
Calcium: 8.5 mg/dL — ABNORMAL LOW (ref 8.9–10.3)
Chloride: 101 mmol/L (ref 98–111)
Creatinine, Ser: 1.38 mg/dL — ABNORMAL HIGH (ref 0.44–1.00)
GFR, Estimated: 44 mL/min — ABNORMAL LOW (ref >60)
Glucose, Bld: 93 mg/dL (ref 70–99)
Potassium: 5.4 mmol/L — ABNORMAL HIGH (ref 3.5–5.1)
Sodium: 135 mmol/L (ref 135–145)
Total Bilirubin: 5.9 mg/dL — ABNORMAL HIGH (ref 0.3–1.2)
Total Protein: 6.2 g/dL — ABNORMAL LOW (ref 6.5–8.1)

## 2020-05-14 LAB — CBC
HCT: 28.8 % — ABNORMAL LOW (ref 36.0–46.0)
Hemoglobin: 9.8 g/dL — ABNORMAL LOW (ref 12.0–15.0)
MCH: 36.8 pg — ABNORMAL HIGH (ref 26.0–34.0)
MCHC: 34 g/dL (ref 30.0–36.0)
MCV: 108.3 fL — ABNORMAL HIGH (ref 80.0–100.0)
Platelets: 185 10*3/uL (ref 150–400)
RBC: 2.66 MIL/uL — ABNORMAL LOW (ref 3.87–5.11)
RDW: 21.6 % — ABNORMAL HIGH (ref 11.5–15.5)
WBC: 7.9 10*3/uL (ref 4.0–10.5)
nRBC: 0 % (ref 0.0–0.2)

## 2020-05-14 LAB — HEPATITIS PANEL, ACUTE
HCV Ab: NONREACTIVE
Hep A IgM: NONREACTIVE
Hep B C IgM: NONREACTIVE
Hepatitis B Surface Ag: NONREACTIVE

## 2020-05-14 LAB — PROTIME-INR
INR: 1.4 — ABNORMAL HIGH (ref 0.8–1.2)
Prothrombin Time: 16.5 seconds — ABNORMAL HIGH (ref 11.4–15.2)

## 2020-05-14 LAB — SARS CORONAVIRUS 2 (TAT 6-24 HRS): SARS Coronavirus 2: NEGATIVE

## 2020-05-14 MED ORDER — HYDROCODONE-ACETAMINOPHEN 5-325 MG PO TABS
1.0000 | ORAL_TABLET | Freq: Three times a day (TID) | ORAL | Status: DC | PRN
  Administered 2020-05-14 – 2020-05-23 (×22): 1 via ORAL
  Filled 2020-05-14 (×23): qty 1

## 2020-05-14 MED ORDER — CHLORHEXIDINE GLUCONATE 0.12 % MT SOLN
15.0000 mL | Freq: Two times a day (BID) | OROMUCOSAL | Status: DC
  Administered 2020-05-14 – 2020-05-23 (×20): 15 mL via OROMUCOSAL
  Filled 2020-05-14 (×20): qty 15

## 2020-05-14 NOTE — Progress Notes (Signed)
PROGRESS NOTE  [name] [name]  DOB: 12/16/1960  PCP: [name], [place] QQP:[id number]  DOA: 05/13/2020  LOS: 1 day   Chief Complaint  Patient presents with  . Abnormal Lab    Brief narrative: [name] [name] is a 60 y.o. female with PMH significant for HTN, chronic alcohol abuse, liver cirrhosis, anxiety/depression. Patient presented to the ED on 3/22  with complaint of bilateral lower extremity edema and abdominal distention, with 20 pounds weight gain in the last few months.   In the ED, patient was noted to have volume overload.  Home.  LFTs were elevated. Admitted to hospitalist service. GI consulted.  Subjective: Patient was seen and examined this morning.  Pleasant middle-aged Caucasian female.  Lying down in bed.  Not in distress.  No alcohol-related tremors at this time.  Assessment/Plan: Anasarca secondary to liver cirrhosis -Presented with bilateral lower extremity edema, abdominal distention -Currently on IV Lasix 40 mg daily. -Pending paracentesis. -Pending echocardiogram. -May benefit from beta-blocker and diuretics at discharge  Alcoholic liver cirrhosis -Patient states he sees Dr. [name] as an outpatient and recently had an EGD. -Inpatient GI consulted as well.  Chronic alcohol abuse  -Continues to drink.  Counseled to quit.   -Continue CIWA protocol to monitor for withdrawal. -Continue Dexilant   Macrocytic anemia -Likely related to alcohol itself. Recent Labs    09/19/19 1029 11/06/19 1149 03/07/20 1022 05/12/20 1605 05/13/20 1346 05/13/20 1906 05/14/20 0425  HGB 11.6*   < > 10.4* 10.4* 9.6* 9.5* 9.8*  MCV 102.0*   < > 103.1* 108.2* 106.8* 106.2* 108.3*  VITAMINB12 1,020  --  379  --   --  1,855*  --   FOLATE  --   --  3.6*  --   --  9.0  --   FERRITIN 115  --  57.3  --   --   --   --   TIBC  --   --   --   --   --  252  --   IRON  --   --  117  --   --  82  --    < > = values in this interval not displayed.   HLD -hold statin d/t  elevated LFTs  Chronic pain -On Norco 5/325 4 times a day. -Encouraged to minimize the use  Anxiety/depression -Continue Abilify, Prozac  Mobility: Encourage ambulation Code Status:   Code Status: Full Code Nutritional status: Body mass index is 39.29 kg/m.     Diet Order            Diet Heart Room service appropriate? Yes; Fluid consistency: Thin  Diet effective now                 DVT prophylaxis: enoxaparin (LOVENOX) injection 40 mg Start: 05/13/20 2200   Antimicrobials:  None  Fluid: None Consultants: GI Family Communication:  None at bedside  Status is: Inpatient  Remains inpatient appropriate because needs paracentesis, IV diuresis   Dispo: The patient is from: Home              Anticipated d/c is to: Home in 2 to 3 days              Patient currently is not medically stable to d/c.   Difficult to place patient No       Infusions:    Scheduled Meds: . ARIPiprazole  30 mg Oral Daily  . aspirin EC  81 mg Oral Daily  .  chlorhexidine  15 mL Mouth Rinse BID  . enoxaparin (LOVENOX) injection  40 mg Subcutaneous Q24H  . FLUoxetine  20 mg Oral BID  . fluticasone  2 spray Each Nare QHS  . folic acid  1 mg Oral Daily  . furosemide  40 mg Intravenous Daily  . lamoTRIgine  100 mg Oral QHS  . loratadine  10 mg Oral Daily  . montelukast  10 mg Oral QHS  . multivitamin with minerals  1 tablet Oral Daily  . pantoprazole  40 mg Oral Daily  . thiamine  100 mg Oral Daily   Or  . thiamine  100 mg Intravenous Daily  . umeclidinium bromide  1 puff Inhalation Daily    Antimicrobials: Anti-infectives (From admission, onward)   None      PRN meds: albuterol, LORazepam **OR** LORazepam, ondansetron (ZOFRAN) IV   Objective: Vitals:   05/14/20 0923 05/14/20 0924  BP:    Pulse:    Resp:    Temp:    SpO2: 96% 96%    Intake/Output Summary (Last 24 hours) at 05/14/2020 1111 Last data filed at 05/13/2020 2340 Gross per 24 hour  Intake --  Output 0 ml   Net 0 ml   Filed Weights   05/13/20 1246 05/13/20 1743 05/14/20 0400  Weight: 108.9 kg 108 kg 107.1 kg   Weight change:  Body mass index is 39.29 kg/m.   Physical Exam: General exam: Pleasant, middle-aged, not in physical distress Skin: No rashes, lesions or ulcers. HEENT: Atraumatic, normocephalic, no obvious bleeding Lungs: Clear to auscultation bilaterally CVS: Regular rate and rhythm, no murmur GI/Abd soft, nontender, distended from ascites, bowel sound present CNS: Alert, awake, oriented x3, no alcohol-related tremors Psychiatry: Mood appropriate Extremities: Pedal edema 1+ bilaterally  Data Review: I have personally reviewed the laboratory data and studies available.  Recent Labs  Lab 05/12/20 1605 05/13/20 1346 05/13/20 1906 05/14/20 0425  WBC 7.5 6.6 7.2 7.9  NEUTROABS 4.9  --   --   --   HGB 10.4* 9.6* 9.5* 9.8*  HCT 30.1* 28.4* 27.5* 28.8*  MCV 108.2* 106.8* 106.2* 108.3*  PLT 130.0* 170 174 185   Recent Labs  Lab 05/12/20 1605 05/13/20 1346 05/13/20 1906 05/14/20 0425  NA 133* 137  --  135  K 4.7 4.5  --  5.4*  CL 97 102  --  101  CO2 29 24  --  25  GLUCOSE 135* 111*  --  93  BUN 5* <5*  --  9  CREATININE 0.75 0.84 1.10* 1.38*  CALCIUM 8.8 8.8*  --  8.5*  MG  --   --  1.6*  --   PHOS  --   --  3.7  --     F/u labs ordered Unresulted Labs (From admission, onward)          Start     Ordered   05/20/20 0500  Creatinine, serum  (enoxaparin (LOVENOX)    CrCl < 30 ml/min)  Weekly,   R     Comments: while on enoxaparin therapy.    05/13/20 1852   05/14/20 0500  Hepatitis panel, acute  Tomorrow morning,   R        05/13/20 1852   05/13/20 1853  Vitamin B1  Once,   R        05/13/20 1852   Unscheduled  CBC with Differential/Platelet  Daily,   R      05/14/20 1111   Unscheduled  Comprehensive metabolic  panel  Daily,   R      05/14/20 1111          Signed, Terrilee Croak, MD [facility] 05/14/2020

## 2020-05-14 NOTE — Plan of Care (Signed)
  Problem: Health Behavior/Discharge Planning: Goal: Ability to manage health-related needs will improve Outcome: Progressing   Problem: Clinical Measurements: Goal: Ability to maintain clinical measurements within normal limits will improve Outcome: Progressing   Problem: Nutrition: Goal: Adequate nutrition will be maintained Outcome: Progressing   Problem: Elimination: Goal: Will not experience complications related to urinary retention Outcome: Progressing   Problem: Pain Managment: Goal: General experience of comfort will improve Outcome: Progressing

## 2020-05-14 NOTE — Progress Notes (Signed)
Pt was given Lasix IV 40 mg @ 2017 and  less than 30 ml output detected thru the night. Bladder scan pt showing 0 ml, notified MD on call and no new orders, will continue plan of care and monitor pt.

## 2020-05-14 NOTE — Telephone Encounter (Signed)
Patient currently in the hospital.  Nothing further needed on origin.

## 2020-05-14 NOTE — Telephone Encounter (Signed)
1. Diffuse interstitial and bronchial thickening which may be due to bronchial inflammation or congestive. 2. Additional patchy bilateral perihilar opacities, more typical of multifocal infection. Recommend correlation for infectious symptoms.  Pt is currently admitted to hospital as of 05/13/20. Result note from yesterday on labs drawn 05/12/20 per PCP  "Reviewed labs and findings from [place] yesterday with patient's liver specialist; both of [place] in agreement that patient needs to be seen at ER for treatment/ admit;   I spoke to patient regarding this treatment plan and she is in agreement; she will plan to go to [place] today as soon as her transportation can get to her house ( per patient, ride is on the way while we are talking); she will plan to follow up with her liver specialist as directed after discharge;

## 2020-05-14 NOTE — Consult Note (Signed)
[facility] Consult: 12:10 PM 05/14/2020  LOS: 1 day    Referring Provider: Dr Marni Griffon  Primary Care Physician:  [name], [place] Primary Gastroenterologist:  Dr Scarlette Shorts.  Roosevelt Locks NP.      Reason for Consultation: Decompensated cirrhosis.   HPI: [name] [name] is a 60 y.o. female.  PMH Hypertension.  Alcoholism.  Alcoholic pancreatitis, pseudocyst.  Morbid obesity.  Anal fissure.  GERD, on Dexilant.  Chronic pain including musculoskeletal and intermittent abdominal pain.  Remote ulcerative proctitis in 2013, not evident on colonoscopy last year.  On chronic narcotics.  Anxiety/depression.  Cirrhosis.  Thrombocytopenia.  Folate deficiency, B1 deficiency in mid January 2022.  S/p hemorrhoidectomy, TAH.  01/29/2020 EGD for esophageal reflux, dark stools, variceal screening.  Portal hypertensive gastropathy w superficial gastric antral erosions.  Biopsied: Reactive gastropathy with erosions, no H. Pylori, no dysplasia etc.   01/29/2020 Colonoscopy.  For surveillance of adenomatous.  Left sided tics.  Nonbleeding internal/external hemorrhoids.  Otherwise unremarkable study.  No retroflexion performed due to narrow rectal vault but excellent view of rectum from anal os.    05/09/2020 RUQ ultrasound.  Exam limited by shadowing bowel gas and body habitus.  Cirrhotic appearing liver without focal lesions.  Mild to moderate ascites in 4 quadrants.   For several months patient has had respiratory problems with postnasal drainage, cough that triggers vomiting.  No nausea.  Persistent, chronic epigastric and mid abdominal discomfort has not changed however.  She was treated with several courses of antibiotics, prednisone.  Developed oral thrush which was successfully treated.  More recently has had oral ulcers  lichen (planus?)  Undergoing treatment by ENT.  The ulcers cause difficulty eating and drinking due to pain upon contact with various ingested materials.  She has had 20 pound weight gain over the last few months and right greater than left lower extremity edema, abdominal distention. Patient still drinking gin on a daily basis anywhere from 3-5 airplane sized bottles/150-250 mL daily.   Fair amount of conflict and stress with her life partner/roommate. Was seen by primary care provider this Monday.  When labs returned showing significant elevation of LFTs she was called at home and sent to the ED. Patient's stools are small.  In the last few days she has had oliguria.  Bladder scan shows no significant retained urine.  Hgb 10.4  >> 9.8, was 10.4 in mid January 2022.  MCV 108.  Platelets normal. INR 1.1.  No evidence of iron, B12, folate deficiency. BUN/creatinine 9/1.3.  Na normal.   T bili 5.3 >> 5.9.  Alk phos 328 >> 286.  AST/ALT 286/66  >> 251/56. Previous LFTs in mid September 2021 with normal T bili, normal alk phos, AST/ALT 58/32. Ultrasound to assess for ascites showed insufficient ascites to perform paracentesis.  Quit smoking cigarettes in 03/2019  Past Medical History:  Diagnosis Date  . ABDOMINAL PAIN -GENERALIZED 09/27/2008   Qualifier: Diagnosis of  By: Trellis Paganini PA-c, Amy S   . Acute epigastric pain 04/27/2011  . Acute pancreatitis 08/12/2012  .  Allergic rhinitis 09/05/2017  . Anal fistula   . Anemia   . Anxiety   . Arthritis   . BIPOLAR AFFECTIVE DISORDER 12/06/2006   Qualifier: History of  By: Elsworth Soho MD, Leanna Sato Bipolar disorder ([place])   . Chronic kidney disease   . COPD (chronic obstructive pulmonary disease) ([place]) 11/08/2017  . Cough 12/06/2006   Annotation: chronic since'96, nml spirometry, +ve methacholine challenge, nml  CT sinuses, inconclusive pH probe, esophageal manometry Centricity Description: SYMPTOM, COUGH Qualifier: History of  By: Elsworth Soho MD, Leanna Sato    Centricity Description: COUGH Qualifier: Diagnosis of  By: Elsworth Soho MD, Leanna Sato    . Depressive disorder, not elsewhere classified   . Diarrhea 09/27/2008   Qualifier: Diagnosis of  By: Trellis Paganini PA-c, Amy S   . Diverticulosis of colon (without mention of hemorrhage)   . Elevated liver enzymes 10/31/2015  . Esophageal reflux   . ETOH abuse 08/14/2012  . Gastritis 04/28/2011  . GERD (gastroesophageal reflux disease)   . Hematochezia 08/14/2012  . Hemoptysis 05/26/2017  . HEMORRHOIDS 09/12/2007   Qualifier: Diagnosis of  By: Nils Pyle CMA ([place]), Mearl Latin    . Hepatic steatosis   . HIATAL HERNIA 09/12/2007   Qualifier: Diagnosis of  By: Nils Pyle CMA ([place]), Mearl Latin    . History of recurrent UTIs   . Hyperlipidemia   . Hypertension   . Hypertensive retinopathy of both eyes 11/24/2015  . INFECTIOUS DIARRHEA 09/27/2008   Qualifier: Diagnosis of  By: Laney Potash, Pam    . Iron deficiency   . Iron overload 09/22/2017  . Nausea alone 09/27/2008   Qualifier: Diagnosis of  By: Laney Potash, [place] 09/27/2008   Qualifier: Diagnosis of  By: Shane Crutch, Amy S   . Obesity, Class III, BMI 40-49.9 (morbid obesity) ([place]) 04/27/2011  . Personal history of colonic polyps 10/16/2009   hyperplastic  . Polyarthralgia 12/02/2015   Evaluated by Dr. Charlestine Night (rheumatologist) 12/29/2015:1): 1)possible fibromyalgia (Suggest physical therapy and flexeril), 2) Polyarthralgia/Insomnia (suggest use of robaxin or flexeril), 3) Nicotine Dependence (counseled to quit). 4)Depression (continue therapy with psychiatrist)  . Pre-diabetes   . Pseudocyst of pancreas 08/12/2012  . Pulmonary nodule, right 11/29/2016  . Rectal fissure 05/11/2011  . Rectal fistula 05/11/2011  . RECTAL PAIN 09/12/2007   Qualifier: Diagnosis of  By: Nils Pyle CMA ([place]), Mearl Latin    . Secondary hemochromatosis 09/22/2017  . Seizures ([place])   . SIRS (systemic inflammatory response syndrome) ([place]) 03/11/2013  . Solitary rectal ulcer 04/12/2011  . TOBACCO  ABUSE 12/06/2006   Qualifier: History of  By: Elsworth Soho MD, Leanna Sato.    . Ulcerative proctitis, nonspecific ([place]) 03/10/2011  . Varicose veins of both lower extremities 12/02/2015    Past Surgical History:  Procedure Laterality Date  . FINGER AMPUTATION  2010   right index  . HEMORROIDECTOMY    . KNEE ARTHROSCOPY     bilateral, left x2  . TOTAL ABDOMINAL HYSTERECTOMY    . VIDEO BRONCHOSCOPY Bilateral 05/31/2017   Procedure: VIDEO BRONCHOSCOPY WITHOUT FLUORO;  Surgeon: Collene Gobble, MD;  Location: [facility];  Service: Cardiopulmonary;  Laterality: Bilateral;    Prior to Admission medications   Medication Sig Start Date End Date Taking? Authorizing Provider  albuterol (VENTOLIN HFA) 108 (90 Base) MCG/ACT inhaler Inhale 2 puffs into the lungs every 6 (six) hours as needed for wheezing or shortness of breath. 12/05/19  Yes Collene Gobble, MD  ARIPiprazole (ABILIFY) 30 MG  tablet Take 30 mg by mouth daily.  08/07/17  Yes [provider]  aspirin EC 81 MG tablet Take 1 tablet (81 mg total) by mouth daily. Swallow whole. 02/07/20  Yes Chandrasekhar, Mahesh A, MD  atorvastatin (LIPITOR) 40 MG tablet Take 2 tablets (80 mg total) by mouth daily. Patient taking differently: Take 40 mg by mouth daily. 02/12/20  Yes Chandrasekhar, Mahesh A, MD  B Complex-C (B-COMPLEX WITH VITAMIN C) tablet Take 1 tablet by mouth daily.   Yes [provider]  dexlansoprazole (DEXILANT) 60 MG capsule Take 1 capsule (60 mg total) by mouth daily. Office visit for further refills 07/24/19  Yes Hilarie Fredrickson, MD  diclofenac sodium (VOLTAREN) 1 % GEL Apply 2 g topically 4 (four) times daily as needed (pain). 10/24/15  Yes [provider]  fexofenadine (ALLEGRA) 180 MG tablet Take 1 tablet (180 mg total) by mouth daily. 08/21/19  Yes Olive Bass, FNP  FLUoxetine (PROZAC) 20 MG capsule Take 20 mg by mouth 2 (two) times daily.  12/01/16  Yes [provider]  fluticasone (FLONASE) 50  MCG/ACT nasal spray SPRAY TWO SPRAYS IN EACH NOSTRIL ONCE DAILY Patient taking differently: Place 2 sprays into both nostrils at bedtime. 08/21/19  Yes Olive Bass, FNP  folic acid (FOLVITE) 1 MG tablet Take 1 tablet (1 mg total) by mouth daily. 03/07/20  Yes Etta Grandchild, MD  HYDROcodone-acetaminophen (NORCO/VICODIN) 5-325 MG tablet Take 1 tablet by mouth every 6 (six) hours as needed for moderate pain.   Yes [provider]  lamoTRIgine (LAMICTAL) 100 MG tablet Take 100 mg by mouth at bedtime.   Yes [provider]  losartan (COZAAR) 50 MG tablet Take 1 tablet (50 mg total) by mouth daily. 10/03/19  Yes Olive Bass, FNP  methocarbamol (ROBAXIN) 500 MG tablet Take 500 mg by mouth 2 (two) times daily as needed for muscle spasms.  06/23/16  Yes [provider]  metoprolol tartrate (LOPRESSOR) 25 MG tablet Take 1 tablet (25 mg total) by mouth 2 (two) times daily. 02/07/20  Yes Chandrasekhar, Mahesh A, MD  montelukast (SINGULAIR) 10 MG tablet TAKE ONE TABLET BY MOUTH AT BEDTIME Patient taking differently: Take 10 mg by mouth at bedtime. 10/05/19  Yes Glenford Bayley, NP  Multiple Vitamins-Minerals (MULTIVITAMIN ADULT EXTRA C) CHEW Chew 1 tablet by mouth daily.   Yes [provider]  Respiratory Therapy Supplies (FLUTTER) DEVI 3 puffs by Does not apply route 4 (four) times daily as needed. Needs to use device 20 min 3-4 times a day for coughing 07/05/19  Yes Glenford Bayley, NP  tamsulosin (FLOMAX) 0.4 MG CAPS capsule Take 0.4 mg by mouth daily. 04/22/20  Yes [provider]  Tiotropium Bromide Monohydrate (SPIRIVA RESPIMAT) 2.5 MCG/ACT AERS Inhale 2 puffs into the lungs daily. 12/25/19  Yes Glenford Bayley, NP  traMADol (ULTRAM) 50 MG tablet Take 50 mg by mouth every 8 (eight) hours as needed (break through pain).  08/16/18  Yes [provider]  furosemide (LASIX) 20 MG tablet Take 1 tablet (20 mg total) by mouth 2 (two) times  daily as needed for fluid or edema. 05/12/20   Olive Bass, FNP  magic mouthwash w/lidocaine SOLN Take 5-10 mLs by mouth every 4 (four) hours as needed for mouth pain. Swish and gargle. Patient not taking: Reported on 05/13/2020 04/21/20   Sharlene Dory, DO  mesalamine (ROWASA) 4 g enema Place 60 mLs (4 g total) rectally at bedtime.  Patient not taking: No sig reported 03/12/20   Irene Shipper, MD  Multiple Vitamin (MULTIVITAMIN WITH MINERALS) TABS tablet Take 1 tablet by mouth daily. Patient not taking: Reported on 05/13/2020 03/15/13   Velvet Bathe, MD  [facility] 4 MG/0.1ML LIQD nasal spray kit Place 0.4 mg into the nose as needed (overdose). 04/20/17   [provider]  ondansetron (ZOFRAN) 4 MG tablet Take 1 tablet (4 mg total) by mouth every 6 (six) hours as needed for nausea or vomiting. Patient not taking: Reported on 05/13/2020 12/04/19   Collene Gobble, MD  promethazine (PHENERGAN) 25 MG suppository INSERT ONE SUPPOSITORY RECTALLY EVERY 6 HOURS AS NEEDED FOR  NAUSEA/ VOMITING 05/06/20   Irene Shipper, MD  thiamine (VITAMIN B-1) 50 MG tablet Take 1 tablet (50 mg total) by mouth daily. Patient not taking: Reported on 05/13/2020 03/12/20   Janith Lima, MD    Scheduled Meds: . ARIPiprazole  30 mg Oral Daily  . aspirin EC  81 mg Oral Daily  . chlorhexidine  15 mL Mouth Rinse BID  . enoxaparin (LOVENOX) injection  40 mg Subcutaneous Q24H  . FLUoxetine  20 mg Oral BID  . fluticasone  2 spray Each Nare QHS  . folic acid  1 mg Oral Daily  . furosemide  40 mg Intravenous Daily  . lamoTRIgine  100 mg Oral QHS  . loratadine  10 mg Oral Daily  . montelukast  10 mg Oral QHS  . multivitamin with minerals  1 tablet Oral Daily  . pantoprazole  40 mg Oral Daily  . thiamine  100 mg Oral Daily   Or  . thiamine  100 mg Intravenous Daily  . umeclidinium bromide  1 puff Inhalation Daily   Infusions:  PRN Meds: albuterol, HYDROcodone-acetaminophen, LORazepam **OR**  LORazepam, ondansetron (ZOFRAN) IV   Allergies as of 05/13/2020  . (No Known Allergies)    Family History  Problem Relation Age of Onset  . Irritable bowel syndrome Mother   . Hypertension Mother   . Atrial fibrillation Mother   . Ovarian cancer Maternal Aunt   . Diabetes Father   . Heart attack Father   . Hypertension Father   . Hyperlipidemia Father   . Thyroid disease Sister   . Hyperlipidemia Sister   . Thyroid disease Brother   . Colon polyps Brother   . Stomach cancer Neg Hx   . Colon cancer Neg Hx     Social History   Socioeconomic History  . Marital status: Single    Spouse name: Not on file  . Number of children: 0  . Years of education: 2  . Highest education level: Not on file  Occupational History  . Occupation: produce Armed forces operational officer: UNEMPLOYED  Tobacco Use  . Smoking status: Former Smoker    Packs/day: 0.25    Years: 46.00    Pack years: 11.50    Types: Cigars, Cigarettes    Quit date: 04/23/2019    Years since quitting: 1.0  . Smokeless tobacco: Never Used  . Tobacco comment: Pt states vaping every day.  stopped vaping.  Vaping Use  . Vaping Use: Never used  Substance and Sexual Activity  . Alcohol use: Yes    Alcohol/week: 1.0 standard drink    Types: 1 Shots of liquor per week    Comment: occ  . Drug use: No  . Sexual activity: Not Currently  Other Topics Concern  . Not on file  Social History Narrative  .  Not on file   Social Determinants of Health   Financial Resource Strain: Not on file  Food Insecurity: Not on file  Transportation Needs: Not on file  Physical Activity: Not on file  Stress: Not on file  Social Connections: Not on file  Intimate Partner Violence: Not on file    REVIEW OF SYSTEMS: Constitutional: Fatigue, moderate weakness. ENT:  No nose bleeds.  post nasal drip.  Oral ulcers as per HPI Pulm: Cough CV:  No palpitations, no LE edema.  No angina GU:  No hematuria, no frequency GI: See HPI Heme: Denies  unusual or excessive bleeding or bruising. Transfusions: None Neuro:  No headaches, no peripheral tingling or numbness no syncope, no seizures. Derm:  No itching, no rash or sores.  Endocrine:  No sweats or chills.  No polyuria or dysuria Immunization: Been vaccinated for COVID-19. Travel:  None beyond local counties in last few months.    PHYSICAL EXAM: Vital signs in last 24 hours: Vitals:   05/14/20 0923 05/14/20 0924  BP:    Pulse:    Resp:    Temp:    SpO2: 96% 96%   Wt Readings from Last 3 Encounters:  05/14/20 107.1 kg  05/12/20 108.9 kg  04/29/20 109.6 kg    General: Obese, pleasant, comfortable, not acutely ill-appearing.  Obviously jaundiced. Head: No facial asymmetry or swelling. Eyes: No scleral icterus or conjunctival pallor. Ears: Not hard of hearing Nose: Sounds congested but no discharge. Mouth: Ulcers on the lateral aspect of the tongue bilaterally. Neck: No JVD, no masses, no thyromegaly Lungs: Clear with excellent breath sounds. Heart: RRR.  No MRG.  S1, S2 present. Abdomen: Obese, soft, slightly protuberant.  Diffusely mildly tender without guarding or rebound.  No HSM, masses, bruits, hernias.  Active bowel sounds..   Rectal: Deferred Musc/Skeltl: Joint redness, swelling or gross deformity. Extremities: Very slight pitting edema on the right foot. Neurologic: Oriented x3.  Good historian.  Moves all 4 limbs without tremor.  No asterixis. Skin: No rash, no sores, no telangiectasia, no significant purpura or bruising Nodes: No cervical adenopathy Psych: Calm, cooperative, pleasant, fluid speech.  Intake/Output from previous day: No intake/output data recorded. Intake/Output this shift: No intake/output data recorded.  LAB RESULTS: Recent Labs    05/13/20 1346 05/13/20 1906 05/14/20 0425  WBC 6.6 7.2 7.9  HGB 9.6* 9.5* 9.8*  HCT 28.4* 27.5* 28.8*  PLT 170 174 185   BMET Lab Results  Component Value Date   NA 135 05/14/2020   NA 137  05/13/2020   NA 133 (L) 05/12/2020   K 5.4 (H) 05/14/2020   K 4.5 05/13/2020   K 4.7 05/12/2020   CL 101 05/14/2020   CL 102 05/13/2020   CL 97 05/12/2020   CO2 25 05/14/2020   CO2 24 05/13/2020   CO2 29 05/12/2020   GLUCOSE 93 05/14/2020   GLUCOSE 111 (H) 05/13/2020   GLUCOSE 135 (H) 05/12/2020   BUN 9 05/14/2020   BUN <5 (L) 05/13/2020   BUN 5 (L) 05/12/2020   CREATININE 1.38 (H) 05/14/2020   CREATININE 1.10 (H) 05/13/2020   CREATININE 0.84 05/13/2020   CALCIUM 8.5 (L) 05/14/2020   CALCIUM 8.8 (L) 05/13/2020   CALCIUM 8.8 05/12/2020   LFT Recent Labs    05/12/20 1605 05/13/20 1346 05/13/20 1906 05/14/20 0425  PROT 6.8 6.8  --  6.2*  ALBUMIN 3.0* 2.6*  --  2.5*  AST 286* 269*  --  251*  ALT 66* 65*  --  56*  ALKPHOS 328* 310*  --  286*  BILITOT 5.3* 5.5* 5.4* 5.9*  BILIDIR  --   --  2.9*  --   IBILI  --   --  2.5*  --    PT/INR Lab Results  Component Value Date   INR 1.1 (H) 11/06/2019   Hepatitis Panel No results for input(s): HEPBSAG, HCVAB, HEPAIGM, HEPBIGM in the last 72 hours. C-Diff No components found for: CDIFF Lipase     Component Value Date/Time   LIPASE 24 05/13/2020 1346    Drugs of Abuse     Component Value Date/Time   LABOPIA POSITIVE (A) 10/03/2008 1820   COCAINSCRNUR NONE DETECTED 10/03/2008 1820   LABBENZ NONE DETECTED 10/03/2008 1820   AMPHETMU NONE DETECTED 10/03/2008 1820   THCU NONE DETECTED 10/03/2008 1820   LABBARB  10/03/2008 1820    NONE DETECTED        DRUG SCREEN FOR MEDICAL PURPOSES ONLY.  IF CONFIRMATION IS NEEDED FOR ANY PURPOSE, NOTIFY LAB WITHIN 5 DAYS.        LOWEST DETECTABLE LIMITS FOR URINE DRUG SCREEN Drug Class       Cutoff (ng/mL) Amphetamine      1000 Barbiturate      200 Benzodiazepine   200 Tricyclics       300 Opiates          300 Cocaine          300 THC              50     RADIOLOGY STUDIES: DG Chest 2 View  Result Date: 05/13/2020 CLINICAL DATA:  Pedal edema.  Bilateral foot swelling  for 2 weeks. EXAM: CHEST - 2 VIEW COMPARISON:  Included portion from coronary CT 02/06/2020, chest radiograph 08/21/2019 FINDINGS: Left costophrenic angle excluded from the field of view. Lower lung volumes from prior exam. There is diffuse interstitial and bronchial thickening. Superimposed patchy airspace opacity in the left greater than right perihilar lungs. The heart is normal in size. Unchanged mediastinal contours. No significant pleural effusion. No pneumothorax. No acute osseous abnormalities are seen. IMPRESSION: 1. Diffuse interstitial and bronchial thickening which may be due to bronchial inflammation or congestive. 2. Additional patchy bilateral perihilar opacities, more typical of multifocal infection. Recommend correlation for infectious symptoms. Electronically Signed   By: Narda Rutherford M.D.   On: 05/13/2020 17:51      IMPRESSION:   *    Cirrhosis of liver.  MELD 17.   Rising LFTs c/w 2 months ago.  Likely ETOH hepatitis.  suboptimal ultrasound w/0 suspicious liver lesions  *    alcoholism, not in remission.  *   Mild ascites.  Less than expected given pt c/o increased abd girth.   Awaiting official reading of this morning's ascites assessing ultrasound but it sounds as if the ascites was too small to tap.  *   GERD.  Symptoms mostly controlled with Dexilant  *     Macrocytic anemia.  *   Bronchitis, patchy lung opacities/CAP.  Normal WBCs.   Many months history of respiratory complaints.  Vigorous coughing leads to regurgitation/emesis without nausea.  *   Chronic, stable abdominal pain. On narcotics for chronic musculoskeletal pain.  *    Oral ulcers.Lichen (planus   PLAN:     *   Check AFP, not done ever that I can find.    *   Will defer decision on more advanced imaging, such as MRI of abdomen to rule  out [facility], to MD.   Azucena Freed  05/14/2020, 12:10 PM Phone [phone]

## 2020-05-14 NOTE — Plan of Care (Signed)
  Problem: Education: Goal: Knowledge of General Education information will improve Description Including pain rating scale, medication(s)/side effects and non-pharmacologic comfort measures Outcome: Progressing   

## 2020-05-15 ENCOUNTER — Inpatient Hospital Stay (HOSPITAL_COMMUNITY): Payer: PPO

## 2020-05-15 ENCOUNTER — Encounter (HOSPITAL_COMMUNITY): Payer: Self-pay | Admitting: Internal Medicine

## 2020-05-15 ENCOUNTER — Encounter (HOSPITAL_COMMUNITY): Payer: PPO

## 2020-05-15 DIAGNOSIS — N179 Acute kidney failure, unspecified: Secondary | ICD-10-CM | POA: Diagnosis not present

## 2020-05-15 DIAGNOSIS — I5031 Acute diastolic (congestive) heart failure: Secondary | ICD-10-CM | POA: Diagnosis not present

## 2020-05-15 DIAGNOSIS — K7031 Alcoholic cirrhosis of liver with ascites: Secondary | ICD-10-CM | POA: Diagnosis not present

## 2020-05-15 DIAGNOSIS — F1020 Alcohol dependence, uncomplicated: Secondary | ICD-10-CM | POA: Diagnosis not present

## 2020-05-15 LAB — CBC WITH DIFFERENTIAL/PLATELET
Abs Immature Granulocytes: 0.02 10*3/uL (ref 0.00–0.07)
Basophils Absolute: 0.1 10*3/uL (ref 0.0–0.1)
Basophils Relative: 1 %
Eosinophils Absolute: 0.3 10*3/uL (ref 0.0–0.5)
Eosinophils Relative: 5 %
HCT: 26.1 % — ABNORMAL LOW (ref 36.0–46.0)
Hemoglobin: 8.5 g/dL — ABNORMAL LOW (ref 12.0–15.0)
Immature Granulocytes: 0 %
Lymphocytes Relative: 26 %
Lymphs Abs: 1.8 10*3/uL (ref 0.7–4.0)
MCH: 36 pg — ABNORMAL HIGH (ref 26.0–34.0)
MCHC: 32.6 g/dL (ref 30.0–36.0)
MCV: 110.6 fL — ABNORMAL HIGH (ref 80.0–100.0)
Monocytes Absolute: 0.8 10*3/uL (ref 0.1–1.0)
Monocytes Relative: 12 %
Neutro Abs: 3.7 10*3/uL (ref 1.7–7.7)
Neutrophils Relative %: 56 %
Platelets: 149 10*3/uL — ABNORMAL LOW (ref 150–400)
RBC: 2.36 MIL/uL — ABNORMAL LOW (ref 3.87–5.11)
RDW: 21.5 % — ABNORMAL HIGH (ref 11.5–15.5)
WBC: 6.8 10*3/uL (ref 4.0–10.5)
nRBC: 0 % (ref 0.0–0.2)

## 2020-05-15 LAB — COMPREHENSIVE METABOLIC PANEL
ALT: 48 U/L — ABNORMAL HIGH (ref 0–44)
AST: 193 U/L — ABNORMAL HIGH (ref 15–41)
Albumin: 2.3 g/dL — ABNORMAL LOW (ref 3.5–5.0)
Alkaline Phosphatase: 234 U/L — ABNORMAL HIGH (ref 38–126)
Anion gap: 7 (ref 5–15)
BUN: 16 mg/dL (ref 6–20)
CO2: 26 mmol/L (ref 22–32)
Calcium: 8.4 mg/dL — ABNORMAL LOW (ref 8.9–10.3)
Chloride: 100 mmol/L (ref 98–111)
Creatinine, Ser: 3.15 mg/dL — ABNORMAL HIGH (ref 0.44–1.00)
GFR, Estimated: 16 mL/min — ABNORMAL LOW (ref >60)
Glucose, Bld: 104 mg/dL — ABNORMAL HIGH (ref 70–99)
Potassium: 4.9 mmol/L (ref 3.5–5.1)
Sodium: 133 mmol/L — ABNORMAL LOW (ref 135–145)
Total Bilirubin: 5.1 mg/dL — ABNORMAL HIGH (ref 0.3–1.2)
Total Protein: 5.7 g/dL — ABNORMAL LOW (ref 6.5–8.1)

## 2020-05-15 LAB — AMMONIA: Ammonia: 78 umol/L — ABNORMAL HIGH (ref 9–35)

## 2020-05-15 LAB — ECHOCARDIOGRAM COMPLETE
Area-P 1/2: 2.76 cm2
Height: 65 in
S' Lateral: 2.4 cm
Weight: 3777.8 oz

## 2020-05-15 LAB — CREATININE, URINE, RANDOM: Creatinine, Urine: 153.81 mg/dL

## 2020-05-15 LAB — AFP TUMOR MARKER: AFP, Serum, Tumor Marker: 2.4 ng/mL (ref 0.0–9.2)

## 2020-05-15 LAB — SODIUM, URINE, RANDOM: Sodium, Ur: <10 mmol/L

## 2020-05-15 MED ORDER — ALBUMIN HUMAN 25 % IV SOLN
50.0000 g | Freq: Once | INTRAVENOUS | Status: AC
  Administered 2020-05-15: 50 g via INTRAVENOUS
  Filled 2020-05-15: qty 200

## 2020-05-15 MED ORDER — ENOXAPARIN SODIUM 30 MG/0.3ML ~~LOC~~ SOLN
30.0000 mg | SUBCUTANEOUS | Status: DC
  Administered 2020-05-15 – 2020-05-17 (×3): 30 mg via SUBCUTANEOUS
  Filled 2020-05-15 (×3): qty 0.3

## 2020-05-15 MED ORDER — PHENAZOPYRIDINE HCL 200 MG PO TABS
200.0000 mg | ORAL_TABLET | Freq: Once | ORAL | Status: AC
  Administered 2020-05-15: 200 mg via ORAL
  Filled 2020-05-15: qty 1

## 2020-05-15 MED ORDER — MAGIC MOUTHWASH W/LIDOCAINE
10.0000 mL | Freq: Three times a day (TID) | ORAL | Status: DC | PRN
  Filled 2020-05-15: qty 10

## 2020-05-15 MED ORDER — CHLORHEXIDINE GLUCONATE CLOTH 2 % EX PADS
6.0000 | MEDICATED_PAD | Freq: Every day | CUTANEOUS | Status: DC
  Administered 2020-05-15 – 2020-05-19 (×5): 6 via TOPICAL

## 2020-05-15 MED ORDER — PHENAZOPYRIDINE HCL 100 MG PO TABS
100.0000 mg | ORAL_TABLET | Freq: Three times a day (TID) | ORAL | Status: DC
  Filled 2020-05-15: qty 1

## 2020-05-15 MED ORDER — ALBUMIN HUMAN 25 % IV SOLN
25.0000 g | Freq: Two times a day (BID) | INTRAVENOUS | Status: AC
  Administered 2020-05-15 – 2020-05-17 (×4): 25 g via INTRAVENOUS
  Filled 2020-05-15 (×4): qty 100

## 2020-05-15 MED ORDER — LACTULOSE 10 GM/15ML PO SOLN
30.0000 g | Freq: Every day | ORAL | Status: DC
  Administered 2020-05-15 – 2020-05-18 (×4): 30 g via ORAL
  Filled 2020-05-15 (×4): qty 60

## 2020-05-15 MED ORDER — GADOBUTROL 1 MMOL/ML IV SOLN
10.0000 mL | Freq: Once | INTRAVENOUS | Status: AC | PRN
  Administered 2020-05-15: 10 mL via INTRAVENOUS

## 2020-05-15 MED ORDER — SODIUM CHLORIDE 0.9 % IV SOLN: INTRAVENOUS | Status: DC

## 2020-05-15 MED ORDER — PERFLUTREN LIPID MICROSPHERE
1.0000 mL | INTRAVENOUS | Status: AC | PRN
  Administered 2020-05-15: 2 mL via INTRAVENOUS
  Filled 2020-05-15: qty 10

## 2020-05-15 NOTE — Consult Note (Signed)
Renal Service Consult Note [facility]  [name] [name] 05/15/2020 Sol Blazing, MD Requesting Physician: Dr. Pietro Cassis  Reason for Consult: Renal failure HPI: The patient is a 60 y.o. year-old w/ hx of tobacco use, seizures, obesity, Iron overload, HTN, HL, fatty liver, ETOH abuse, GERD, diarrhea, depression, COPD, bipolar d/o, anxiety presented to ED on 3/22 for elevated LFT"s on OP labs. Also not voiding as much as usual, lower ext swelling R > L.  Creat on admit was 0.75. Creat yest was ^ to 1.38 and today is 3.15. asked to see for renal failure.   Pt seen in room. C/o abd and leg swelling for last few months. At least 20 lbs wt gain. Outpt US showed ascites in all 4 quadrants. Not voiding much. No N/V or confusion, Ox 3.   She rec'd IV lasix 40 once on 3/22 and once on 3/23 yest, now dc'd. Has not rec'd any BP lowering meds here. No acei/ ARB or nsaids. She got some IV NS at 125 cc/hr today but since dc'd. She has had IV albumin x 2. She has been getting IV ativan for etoh withdrawal protocol.    ROS  denies CP  no joint pain   no HA  no blurry vision  no rash  no diarrhea  no nausea/ vomiting    Past Medical History  Past Medical History:  Diagnosis Date  . ABDOMINAL PAIN -GENERALIZED 09/27/2008   Qualifier: Diagnosis of  By: Trellis Paganini PA-c, Amy S   . Acute epigastric pain 04/27/2011  . Acute pancreatitis 08/12/2012  . Allergic rhinitis 09/05/2017  . Anal fistula   . Anemia   . Anxiety   . Arthritis   . BIPOLAR AFFECTIVE DISORDER 12/06/2006   Qualifier: History of  By: Elsworth Soho MD, Leanna Sato Bipolar disorder ([place])   . Chronic kidney disease   . COPD (chronic obstructive pulmonary disease) ([place]) 11/08/2017  . Cough 12/06/2006   Annotation: chronic since'96, nml spirometry, +ve methacholine challenge, nml  CT sinuses, inconclusive pH probe, esophageal manometry Centricity Description: SYMPTOM, COUGH Qualifier: History of  By: Elsworth Soho MD, Leanna Sato   Centricity  Description: COUGH Qualifier: Diagnosis of  By: Elsworth Soho MD, Leanna Sato    . Depressive disorder, not elsewhere classified   . Diarrhea 09/27/2008   Qualifier: Diagnosis of  By: Trellis Paganini PA-c, Amy S   . Diverticulosis of colon (without mention of hemorrhage)   . Elevated liver enzymes 10/31/2015  . Esophageal reflux   . ETOH abuse 08/14/2012  . Gastritis 04/28/2011  . GERD (gastroesophageal reflux disease)   . Hematochezia 08/14/2012  . Hemoptysis 05/26/2017  . HEMORRHOIDS 09/12/2007   Qualifier: Diagnosis of  By: Nils Pyle CMA ([place]), Mearl Latin    . Hepatic steatosis   . HIATAL HERNIA 09/12/2007   Qualifier: Diagnosis of  By: Nils Pyle CMA ([place]), Mearl Latin    . History of recurrent UTIs   . Hyperlipidemia   . Hypertension   . Hypertensive retinopathy of both eyes 11/24/2015  . INFECTIOUS DIARRHEA 09/27/2008   Qualifier: Diagnosis of  By: Laney Potash, Pam    . Iron deficiency   . Iron overload 09/22/2017  . Nausea alone 09/27/2008   Qualifier: Diagnosis of  By: Laney Potash, [place] 09/27/2008   Qualifier: Diagnosis of  By: Shane Crutch, Amy S   . Obesity, Class III, BMI 40-49.9 (morbid obesity) ([place]) 04/27/2011  . Personal history of colonic polyps 10/16/2009  hyperplastic  . Polyarthralgia 12/02/2015   Evaluated by Dr. Charlestine Night (rheumatologist) 12/29/2015:1): 1)possible fibromyalgia (Suggest physical therapy and flexeril), 2) Polyarthralgia/Insomnia (suggest use of robaxin or flexeril), 3) Nicotine Dependence (counseled to quit). 4)Depression (continue therapy with psychiatrist)  . Pre-diabetes   . Pseudocyst of pancreas 08/12/2012  . Pulmonary nodule, right 11/29/2016  . Rectal fissure 05/11/2011  . Rectal fistula 05/11/2011  . RECTAL PAIN 09/12/2007   Qualifier: Diagnosis of  By: Nils Pyle CMA ([place]), Mearl Latin    . Secondary hemochromatosis 09/22/2017  . Seizures ([place])   . SIRS (systemic inflammatory response syndrome) ([place]) 03/11/2013  . Solitary rectal ulcer 04/12/2011  . TOBACCO ABUSE  12/06/2006   Qualifier: History of  By: Elsworth Soho MD, Leanna Sato.    . Ulcerative proctitis, nonspecific ([place]) 03/10/2011  . Varicose veins of both lower extremities 12/02/2015   Past Surgical History  Past Surgical History:  Procedure Laterality Date  . FINGER AMPUTATION  2010   right index  . HEMORROIDECTOMY    . KNEE ARTHROSCOPY     bilateral, left x2  . TOTAL ABDOMINAL HYSTERECTOMY    . VIDEO BRONCHOSCOPY Bilateral 05/31/2017   Procedure: VIDEO BRONCHOSCOPY WITHOUT FLUORO;  Surgeon: Collene Gobble, MD;  Location: [facility];  Service: Cardiopulmonary;  Laterality: Bilateral;   Family History  Family History  Problem Relation Age of Onset  . Irritable bowel syndrome Mother   . Hypertension Mother   . Atrial fibrillation Mother   . Ovarian cancer Maternal Aunt   . Diabetes Father   . Heart attack Father   . Hypertension Father   . Hyperlipidemia Father   . Thyroid disease Sister   . Hyperlipidemia Sister   . Thyroid disease Brother   . Colon polyps Brother   . Stomach cancer Neg Hx   . Colon cancer Neg Hx    Social History  reports that she quit smoking about 12 months ago. Her smoking use included cigars and cigarettes. She has a 11.50 pack-year smoking history. She has never used smokeless tobacco. She reports current alcohol use of about 1.0 standard drink of alcohol per week. She reports that she does not use drugs. Allergies No Known Allergies Home medications Prior to Admission medications   Medication Sig Start Date End Date Taking? Authorizing Provider  albuterol (VENTOLIN HFA) 108 (90 Base) MCG/ACT inhaler Inhale 2 puffs into the lungs every 6 (six) hours as needed for wheezing or shortness of breath. 12/05/19  Yes Collene Gobble, MD  ARIPiprazole (ABILIFY) 30 MG tablet Take 30 mg by mouth daily.  08/07/17  Yes [provider]  aspirin EC 81 MG tablet Take 1 tablet (81 mg total) by mouth daily. Swallow whole. 02/07/20  Yes Chandrasekhar, Mahesh A, MD   atorvastatin (LIPITOR) 40 MG tablet Take 2 tablets (80 mg total) by mouth daily. Patient taking differently: Take 40 mg by mouth daily. 02/12/20  Yes Chandrasekhar, Mahesh A, MD  B Complex-C (B-COMPLEX WITH VITAMIN C) tablet Take 1 tablet by mouth daily.   Yes [provider]  dexlansoprazole (DEXILANT) 60 MG capsule Take 1 capsule (60 mg total) by mouth daily. Office visit for further refills 07/24/19  Yes Irene Shipper, MD  diclofenac sodium (VOLTAREN) 1 % GEL Apply 2 g topically 4 (four) times daily as needed (pain). 10/24/15  Yes [provider]  fexofenadine (ALLEGRA) 180 MG tablet Take 1 tablet (180 mg total) by mouth daily. 08/21/19  Yes [name], FNP  FLUoxetine (PROZAC) 20 MG capsule Take  20 mg by mouth 2 (two) times daily.  12/01/16  Yes [provider]  fluticasone (FLONASE) 50 MCG/ACT nasal spray SPRAY TWO SPRAYS IN EACH NOSTRIL ONCE DAILY Patient taking differently: Place 2 sprays into both nostrils at bedtime. 08/21/19  Yes [name], FNP  folic acid (FOLVITE) 1 MG tablet Take 1 tablet (1 mg total) by mouth daily. 03/07/20  Yes Janith Lima, MD  HYDROcodone-acetaminophen (NORCO/VICODIN) 5-325 MG tablet Take 1 tablet by mouth every 6 (six) hours as needed for moderate pain.   Yes [provider]  lamoTRIgine (LAMICTAL) 100 MG tablet Take 100 mg by mouth at bedtime.   Yes [provider]  losartan (COZAAR) 50 MG tablet Take 1 tablet (50 mg total) by mouth daily. 10/03/19  Yes [name], FNP  methocarbamol (ROBAXIN) 500 MG tablet Take 500 mg by mouth 2 (two) times daily as needed for muscle spasms.  06/23/16  Yes [provider]  metoprolol tartrate (LOPRESSOR) 25 MG tablet Take 1 tablet (25 mg total) by mouth 2 (two) times daily. 02/07/20  Yes Chandrasekhar, Mahesh A, MD  montelukast (SINGULAIR) 10 MG tablet TAKE ONE TABLET BY MOUTH AT BEDTIME Patient taking differently: Take 10 mg by mouth at  bedtime. 10/05/19  Yes Martyn Ehrich, NP  Multiple Vitamins-Minerals (MULTIVITAMIN ADULT EXTRA C) CHEW Chew 1 tablet by mouth daily.   Yes [provider]  Respiratory Therapy Supplies (FLUTTER) DEVI 3 puffs by Does not apply route 4 (four) times daily as needed. Needs to use device 20 min 3-4 times a day for coughing 07/05/19  Yes Martyn Ehrich, NP  tamsulosin (FLOMAX) 0.4 MG CAPS capsule Take 0.4 mg by mouth daily. 04/22/20  Yes [provider]  Tiotropium Bromide Monohydrate (SPIRIVA RESPIMAT) 2.5 MCG/ACT AERS Inhale 2 puffs into the lungs daily. 12/25/19  Yes Martyn Ehrich, NP  traMADol (ULTRAM) 50 MG tablet Take 50 mg by mouth every 8 (eight) hours as needed (break through pain).  08/16/18  Yes [provider]  furosemide (LASIX) 20 MG tablet Take 1 tablet (20 mg total) by mouth 2 (two) times daily as needed for fluid or edema. 05/12/20   [name], FNP  magic mouthwash w/lidocaine SOLN Take 5-10 mLs by mouth every 4 (four) hours as needed for mouth pain. Swish and gargle. Patient not taking: Reported on 05/13/2020 04/21/20   Shelda Pal, DO  mesalamine (ROWASA) 4 g enema Place 60 mLs (4 g total) rectally at bedtime. Patient not taking: No sig reported 03/12/20   Irene Shipper, MD  Multiple Vitamin (MULTIVITAMIN WITH MINERALS) TABS tablet Take 1 tablet by mouth daily. Patient not taking: Reported on 05/13/2020 03/15/13   Velvet Bathe, MD  [facility] 4 MG/0.1ML LIQD nasal spray kit Place 0.4 mg into the nose as needed (overdose). 04/20/17   [provider]  ondansetron (ZOFRAN) 4 MG tablet Take 1 tablet (4 mg total) by mouth every 6 (six) hours as needed for nausea or vomiting. Patient not taking: Reported on 05/13/2020 12/04/19   Collene Gobble, MD  promethazine (PHENERGAN) 25 MG suppository INSERT ONE SUPPOSITORY RECTALLY EVERY 6 HOURS AS NEEDED FOR  NAUSEA/ VOMITING 05/06/20   Irene Shipper, MD  thiamine (VITAMIN B-1) 50 MG tablet  Take 1 tablet (50 mg total) by mouth daily. Patient not taking: Reported on 05/13/2020 03/12/20   Janith Lima, MD     Vitals:   05/15/20 0500 05/15/20 9242 05/15/20 6834 05/15/20 1962  BP: (!) 117/49 126/76    Pulse: 74 74    Resp: 18     Temp: 98 F (36.7 C)     TempSrc: Oral     SpO2: 90% 90% 90% 90%  Weight:      Height:       Exam Gen alert, no distress, dry mouth No rash, cyanosis or gangrene Sclera anicteric, throat clear  No jvd or bruits, flat neck veins Chest clear bilat to bases, no rales/ wheezing RRR no MRG Abd soft ntnd no mass or ascites +bs GU normal MS no joint effusions or deformity Ext 1-2+ pitting pretib/ hip edema, mild abd wall edema  Neuro is alert, Ox 3 , nf, no asterixis       Home meds:  - abilify 30 qd/ prozac 20 bid/ lamictal 100 hs/ norco prn/ robaxin prn/ ultram tid prn  - lasix 20 bid prn/ lopressor 25 bid/ losartan 50 qd/ asa 81/ lipitor 40 qd  - dexilant 60 qd/ flomax 0.4 qd  - singulair 10 qd/ spiriva respimat 2 puff qd  - prn's/ vitamins/ supplements     Renal US 3/24 - bilat kidneys were not fully visualized d/t overlying bowel gas. R 13.2 cm and L 10.0 cm, no obvious hydro or mass.    UA 3/22 - 6-10 wbc, 11-20 epi, no rbc, protein 30, neg Hb, 50 glu, turbid and amber, +small bili   CXR 3/21 - IMPRESSION: 1. Diffuse interstitial and bronchial thickening which may be due to bronchial inflammation or congestive. 2. Additional patchy bilateral perihilar opacities, more typical of multifocal infection. Recommend correlation for infectious symptoms.    Na 133 K 4.9  CO2 26  BUN 16  Cr 3.15  eGFR  16   AST 193/ ALT 48     Tbili 5.1  GGT 624  AG 7- 11 wnl  Alb 2.3  Mg 1.6  Phos 3.7    WBC 6.8K   Hb 8.5 MCV 110  plt 149    ECHO 3/23 >> 1. Left ventricular ejection fraction, by estimation, is 60 to 65%. The  left ventricle has normal function. The left ventricle has no regional  wall motion abnormalities. No LVH. Left ventricular diastolic  parameters were  normal.     BP's here lowest was 108/ 70, highest 144/70   RR 18- 24, HR 70- 88 range  afebrile    Wt 109 admit, 107kg today     UNa, UCr pending  Assessment/ Plan: 1. AKI - creat 0.8 on admit, up to 1.5 yest and 3.1 today. Not making urine. Gradual onset of LE edema and abd swelling over last month. Not sure etiology. Dry mouth/ flat neck veins on exam but also + leg edema and ascites. BP's are normal, not low. Possibilities are HRS, bladder outlet obstruction/ urinary retention, hypovolemia d/t diuretics, other. Doubt GN w/o blood / protein on the UA.  ECHO is normal. Renal US poor quality. Will get renal stone CT abd w/o contrast. Also place foley, UNa/ Cr, gentle IVF"s for poss intravasc hypovolemia and cont IV albumin. Avoid diuretics.  2. Cirrhosis 3. ETOH abuse 4. HTN - home BP lowering meds on hold here (ARB, BB) 5. COPD 6. Anxiety/ depression - per Pamelia Hoit  MD 05/15/2020, 4:04 PM  Recent Labs  Lab 05/14/20 0425 05/15/20 0350  WBC 7.9 6.8  HGB 9.8* 8.5*   Recent Labs  Lab 05/13/20 1906 05/14/20 0425 05/15/20 0350  K  --  5.4* 4.9  BUN  --  9 16  CREATININE 1.10* 1.38* 3.15*  CALCIUM  --  8.5* 8.4*  PHOS 3.7  --   --

## 2020-05-15 NOTE — Progress Notes (Signed)
Patient ID: [name] [name], female   DOB: 07/02/1960, 59 y.o.   MRN: [id number]    Progress Note   Subjective   Day #2 CC; decompensated cirrhosis, EtOH  Ultrasound/Limited -no significant ascites  MRI done this morning, pending  INR 1.4/pro time 16.5 BUN 16/creatinine 3.15 AFP 2.4  T bili 5.1/alk phos 234/AST 193/ALT 48 Albumin 2.3 Hemoglobin 8.5/MCV 110/platelets 149  Current discriminant function score = 11  Sitting at the side of the bed trying to eat, having difficulty coordinating bettering her toast.  She admits to feeling a bit tremulous and says she is not thinking well this morning.  She has had problems with EtOH withdrawal in the past and had an EtOH withdrawal induced seizure in 2018. Says she has been drinking daily at least a half a pint She has not been urinating much by her report No complaints of abdominal pain    Objective   Vital signs in last 24 hours: Temp:  [98 F (36.7 C)-98.2 F (36.8 C)] 98 F (36.7 C) (03/24 0500) Pulse Rate:  [74-78] 74 (03/24 0837) Resp:  [18] 18 (03/24 0500) BP: (111-126)/(49-76) 126/76 (03/24 0837) SpO2:  [90 %] 90 % (03/24 0910) Last BM Date: 05/14/20 General: Older white female in NAD, jaundiced Heart:  Regular rate and rhythm; no murmurs Lungs: Respirations even and unlabored, lungs CTA bilaterally Abdomen:  Soft, large , no focal tenderness, no fluid wave. Normal bowel sounds. Extremities: 1-2+ edema bilateral lower extremities Neurologic:  Alert and oriented, a bit tremulous and mixing up her words Psych:  Cooperative. Normal mood and affect.  Intake/Output from previous day: 03/23 0701 - 03/24 0700 In: 360 [P.O.:360] Out: -  Intake/Output this shift: No intake/output data recorded.  Lab Results: Recent Labs    05/13/20 1906 05/14/20 0425 05/15/20 0350  WBC 7.2 7.9 6.8  HGB 9.5* 9.8* 8.5*  HCT 27.5* 28.8* 26.1*  PLT 174 185 149*   BMET Recent Labs    05/13/20 1346 05/13/20 1906 05/14/20 0425  05/15/20 0350  NA 137  --  135 133*  K 4.5  --  5.4* 4.9  CL 102  --  101 100  CO2 24  --  25 26  GLUCOSE 111*  --  93 104*  BUN <5*  --  9 16  CREATININE 0.84 1.10* 1.38* 3.15*  CALCIUM 8.8*  --  8.5* 8.4*   LFT Recent Labs    05/13/20 1906 05/14/20 0425 05/15/20 0350  PROT  --    < > 5.7*  ALBUMIN  --    < > 2.3*  AST  --    < > 193*  ALT  --    < > 48*  ALKPHOS  --    < > 234*  BILITOT 5.4*   < > 5.1*  BILIDIR 2.9*  --   --   IBILI 2.5*  --   --    < > = values in this interval not displayed.   PT/INR Recent Labs    05/14/20 1613  LABPROT 16.5*  INR 1.4*    Studies/Results: US ASCITES (ABDOMEN LIMITED)  Result Date: 05/14/2020 CLINICAL DATA:  Abdominal distension, assess for ascites and paracentesis EXAM: LIMITED ABDOMEN ULTRASOUND FOR ASCITES TECHNIQUE: Limited ultrasound survey for ascites was performed in all four abdominal quadrants. COMPARISON:  03/11/2013 FINDINGS: Survey of the abdominal 4 quadrants demonstrates only a very minimal amount of right upper quadrant and right lower quadrant ascites. There is not enough fluid to warrant   paracentesis. Procedure not performed. IMPRESSION: Trace abdominopelvic ascites. Electronically Signed   By: Jerilynn Mages.  Shick M.D.   On: 05/14/2020 13:27       Assessment / Plan:    #29 60 year old female with decompensated alcohol induced cirrhosis, probable superimposed EtOH hepatitis.  She does not meet criteria for steroid therapy  #2 early EtOH withdrawal-on CIWA protocol We will also check venous ammonia and start lactulose 30 g daily  #3 rising creatinine-Lasix now on hold, creatinine up to 3.14 today and patient with decreased urination  IV fluids have been started Will order renal ultrasound and spot urine for sodium and potassium Concerned with HRS Needs strict I/O's Please ask renal to see   #4 [place] surveillance-AFP normal, MRI result pending    Active Problems:   Anasarca   Alcoholic cirrhosis of liver with  ascites ([place])   Alcohol use disorder, severe, dependence ([place])   Bilirubinemia     LOS: 2 days   Keishaun Hazel EsterwoodPA-C  05/15/2020, 9:39 AM

## 2020-05-15 NOTE — Progress Notes (Signed)
PROGRESS NOTE  [name]  DOB: 08-25-1960  PCP: [name], [place] DGL:[id number]  DOA: 05/13/2020  LOS: 2 days   Chief Complaint  Patient presents with  . Abnormal Lab    Brief narrative: SUHEILY BIRKS is a 60 y.o. female with PMH significant for HTN, chronic alcohol abuse, liver cirrhosis, anxiety/depression. Patient presented to the ED on 3/22  with complaint of bilateral lower extremity edema and abdominal distention, with 20 pounds weight gain in the last few months.   In the ED, patient was noted to have volume overload.  Home.  LFTs were elevated. Admitted to hospitalist service. GI consulted.  Subjective: Patient was seen and examined this morning.   Sitting over the edge of the bed.  Taking her breakfast.  Not in distress.  No alcohol-related tremors.  She was given Ativan.  Later this morning, she felt weak.  I examined her again.  I think she was showing the effect of Ativan.  No suspicion of stroke at this time.  Assessment/Plan: Alcoholic liver cirrhosis Superimposed alcoholic hepatitis -Presented with bilateral lower extremity edema, abdominal distention -With suspicion of volume overload, patient was given IV Lasix 40 mg.  She got a total of 2 doses.  Lasix was held later because of AKI. -Ultrasound abdomen did not reveal any ascites for paracentesis.  Patient still has 1+ bilateral pedal edema but because of AKI, cannot give diuretics at this time. -Pending echocardiogram. -May benefit from beta-blocker and diuretics at discharge -GI consult appreciated.  AKI -Presented with a normal creatinine at baseline.  In last 48 hours, there has been steep rise in creatinine, 3.15 today.  Patient has minimal urine output.  She got 2 doses of IV Lasix in total initially.  Currently on hold.  Started on IV fluids this morning.  Nephrology consultation called as well. Recent Labs    09/19/19 1029 11/06/19 1149 02/26/20 1515 03/07/20 1022 05/12/20 1605  05/13/20 1346 05/13/20 1906 05/14/20 0425 05/15/20 0350  BUN 14 9 12 7  5* <5*  --  9 16  CREATININE 0.83 0.73 0.80 0.89 0.75 0.84 1.10* 1.38* 3.15*   Chronic alcohol abuse  -Continues to drink.  Counseled to quit.   -Continue CIWA protocol to monitor for withdrawal. -Continue Dexilant   Macrocytic anemia -Likely related to alcohol itself.  There is drop in hemoglobin from 9.8 yesterday to 8.5 today.  No evidence of active bleeding.  Continue to monitor. Recent Labs    09/19/19 1029 11/06/19 1149 03/07/20 1022 05/12/20 1605 05/13/20 1346 05/13/20 1906 05/14/20 0425 05/15/20 0350  HGB 11.6*   < > 10.4* 10.4* 9.6* 9.5* 9.8* 8.5*  MCV 102.0*   < > 103.1* 108.2* 106.8* 106.2* 108.3* 110.6*  VITAMINB12 1,020  --  379  --   --  1,855*  --   --   FOLATE  --   --  3.6*  --   --  9.0  --   --   FERRITIN 115  --  57.3  --   --   --   --   --   TIBC  --   --   --   --   --  252  --   --   IRON  --   --  117  --   --  82  --   --    < > = values in this interval not displayed.   HLD -hold statin d/t elevated LFTs  Chronic pain -On  Norco 5/325 4 times a day. -Encouraged to minimize the use  Anxiety/depression -Continue Abilify, Prozac  Mobility: Encourage ambulation Code Status:   Code Status: Full Code Nutritional status: Body mass index is 39.29 kg/m.     Diet Order            Diet 2 gram sodium Room service appropriate? Yes; Fluid consistency: Thin  Diet effective now                 DVT prophylaxis: enoxaparin (LOVENOX) injection 30 mg Start: 05/15/20 2200   Antimicrobials:  None  Fluid: None Consultants: GI Family Communication:  None at bedside  Status is: Inpatient  Remains inpatient appropriate because patient needs IV fluid, renal evaluation.  Dispo: The patient is from: Home              Anticipated d/c is to: Home in 2 to 3 days              Patient currently is not medically stable to d/c.   Difficult to place patient No       Infusions:   . albumin human      Scheduled Meds: . ARIPiprazole  30 mg Oral Daily  . aspirin EC  81 mg Oral Daily  . chlorhexidine  15 mL Mouth Rinse BID  . enoxaparin (LOVENOX) injection  30 mg Subcutaneous Q24H  . FLUoxetine  20 mg Oral BID  . fluticasone  2 spray Each Nare QHS  . folic acid  1 mg Oral Daily  . lactulose  30 g Oral Daily  . lamoTRIgine  100 mg Oral QHS  . loratadine  10 mg Oral Daily  . montelukast  10 mg Oral QHS  . multivitamin with minerals  1 tablet Oral Daily  . pantoprazole  40 mg Oral Daily  . thiamine  100 mg Oral Daily   Or  . thiamine  100 mg Intravenous Daily  . umeclidinium bromide  1 puff Inhalation Daily    Antimicrobials: Anti-infectives (From admission, onward)   None      PRN meds: albuterol, HYDROcodone-acetaminophen, LORazepam **OR** LORazepam, magic mouthwash w/lidocaine, ondansetron (ZOFRAN) IV   Objective: Vitals:   05/15/20 0908 05/15/20 0910  BP:    Pulse:    Resp:    Temp:    SpO2: 90% 90%    Intake/Output Summary (Last 24 hours) at 05/15/2020 1316 Last data filed at 05/15/2020 0430 Gross per 24 hour  Intake 360 ml  Output --  Net 360 ml   Filed Weights   05/13/20 1246 05/13/20 1743 05/14/20 0400  Weight: 108.9 kg 108 kg 107.1 kg   Weight change:  Body mass index is 39.29 kg/m.   Physical Exam: General exam: Pleasant, middle-aged, not in physical distress Skin: No rashes, lesions or ulcers. HEENT: Atraumatic, normocephalic, no obvious bleeding Lungs: Clear to auscultation bilaterally CVS: Regular rate and rhythm, no murmur GI/Abd soft, nontender, distended from ascites, bowel sound present CNS: Alert, awake, oriented x3, no alcohol-related tremors Psychiatry: Mood appropriate Extremities: Continues to have pedal edema 1+ bilaterally  Data Review: I have personally reviewed the laboratory data and studies available.  Recent Labs  Lab 05/12/20 1605 05/13/20 1346 05/13/20 1906 05/14/20 0425 05/15/20 0350  WBC  7.5 6.6 7.2 7.9 6.8  NEUTROABS 4.9  --   --   --  3.7  HGB 10.4* 9.6* 9.5* 9.8* 8.5*  HCT 30.1* 28.4* 27.5* 28.8* 26.1*  MCV 108.2* 106.8* 106.2* 108.3* 110.6*  PLT 130.0*  170 174 185 149*   Recent Labs  Lab 05/12/20 1605 05/13/20 1346 05/13/20 1906 05/14/20 0425 05/15/20 0350  NA 133* 137  --  135 133*  K 4.7 4.5  --  5.4* 4.9  CL 97 102  --  101 100  CO2 29 24  --  25 26  GLUCOSE 135* 111*  --  93 104*  BUN 5* <5*  --  9 16  CREATININE 0.75 0.84 1.10* 1.38* 3.15*  CALCIUM 8.8 8.8*  --  8.5* 8.4*  MG  --   --  1.6*  --   --   PHOS  --   --  3.7  --   --     F/u labs ordered Unresulted Labs (From admission, onward)          Start     Ordered   05/20/20 0500  Creatinine, serum  (enoxaparin (LOVENOX)    CrCl < 30 ml/min)  Weekly,   R     Comments: while on enoxaparin therapy.    05/13/20 1852   05/15/20 0952  Na and K (sodium & potassium), rand urine  Once,   R        05/15/20 0956   05/15/20 0500  CBC with Differential/Platelet  Daily,   R      05/14/20 1111   05/15/20 0500  Comprehensive metabolic panel  Daily,   R      05/14/20 1111   05/13/20 1853  Vitamin B1  Once,   R        05/13/20 1852          Signed, Terrilee Croak, MD [facility] 05/15/2020

## 2020-05-15 NOTE — Progress Notes (Signed)
  Echocardiogram 2D Echocardiogram has been performed.  [name] [name] 05/15/2020, 2:11 PM

## 2020-05-16 DIAGNOSIS — F1020 Alcohol dependence, uncomplicated: Secondary | ICD-10-CM | POA: Diagnosis not present

## 2020-05-16 DIAGNOSIS — N179 Acute kidney failure, unspecified: Secondary | ICD-10-CM | POA: Diagnosis not present

## 2020-05-16 DIAGNOSIS — K7031 Alcoholic cirrhosis of liver with ascites: Secondary | ICD-10-CM | POA: Diagnosis not present

## 2020-05-16 LAB — PHOSPHORUS: Phosphorus: 3.7 mg/dL (ref 2.5–4.6)

## 2020-05-16 LAB — CBC WITH DIFFERENTIAL/PLATELET
Abs Immature Granulocytes: 0.02 10*3/uL (ref 0.00–0.07)
Basophils Absolute: 0 10*3/uL (ref 0.0–0.1)
Basophils Relative: 1 %
Eosinophils Absolute: 0.3 10*3/uL (ref 0.0–0.5)
Eosinophils Relative: 6 %
HCT: 24.8 % — ABNORMAL LOW (ref 36.0–46.0)
Hemoglobin: 8.3 g/dL — ABNORMAL LOW (ref 12.0–15.0)
Immature Granulocytes: 0 %
Lymphocytes Relative: 26 %
Lymphs Abs: 1.5 10*3/uL (ref 0.7–4.0)
MCH: 36.9 pg — ABNORMAL HIGH (ref 26.0–34.0)
MCHC: 33.5 g/dL (ref 30.0–36.0)
MCV: 110.2 fL — ABNORMAL HIGH (ref 80.0–100.0)
Monocytes Absolute: 0.6 10*3/uL (ref 0.1–1.0)
Monocytes Relative: 11 %
Neutro Abs: 3.1 10*3/uL (ref 1.7–7.7)
Neutrophils Relative %: 56 %
Platelets: 141 10*3/uL — ABNORMAL LOW (ref 150–400)
RBC: 2.25 MIL/uL — ABNORMAL LOW (ref 3.87–5.11)
RDW: 21 % — ABNORMAL HIGH (ref 11.5–15.5)
WBC: 5.6 10*3/uL (ref 4.0–10.5)
nRBC: 0 % (ref 0.0–0.2)

## 2020-05-16 LAB — COMPREHENSIVE METABOLIC PANEL
ALT: 43 U/L (ref 0–44)
AST: 170 U/L — ABNORMAL HIGH (ref 15–41)
Albumin: 3.1 g/dL — ABNORMAL LOW (ref 3.5–5.0)
Alkaline Phosphatase: 206 U/L — ABNORMAL HIGH (ref 38–126)
Anion gap: 9 (ref 5–15)
BUN: 18 mg/dL (ref 6–20)
CO2: 25 mmol/L (ref 22–32)
Calcium: 8.7 mg/dL — ABNORMAL LOW (ref 8.9–10.3)
Chloride: 102 mmol/L (ref 98–111)
Creatinine, Ser: 3.7 mg/dL — ABNORMAL HIGH (ref 0.44–1.00)
GFR, Estimated: 14 mL/min — ABNORMAL LOW (ref >60)
Glucose, Bld: 85 mg/dL (ref 70–99)
Potassium: 4.4 mmol/L (ref 3.5–5.1)
Sodium: 136 mmol/L (ref 135–145)
Total Bilirubin: 5.4 mg/dL — ABNORMAL HIGH (ref 0.3–1.2)
Total Protein: 6.3 g/dL — ABNORMAL LOW (ref 6.5–8.1)

## 2020-05-16 LAB — MAGNESIUM: Magnesium: 1.7 mg/dL (ref 1.7–2.4)

## 2020-05-16 MED ORDER — MAGNESIUM SULFATE 2 GM/50ML IV SOLN
2.0000 g | Freq: Once | INTRAVENOUS | Status: AC
  Administered 2020-05-16: 2 g via INTRAVENOUS
  Filled 2020-05-16: qty 50

## 2020-05-16 NOTE — Progress Notes (Signed)
Progress Note   Subjective  Patient seems more alert, coordinated this AM. Received multiple doses of albumin yesterday and overnight, has started making some urine again. No pain.   Objective   Vital signs in last 24 hours: Temp:  [97.9 F (36.6 C)-98 F (36.7 C)] 97.9 F (36.6 C) (03/25 0545) Pulse Rate:  [69-75] 75 (03/25 0545) Resp:  [18-20] 20 (03/25 0545) BP: (121-129)/(58-60) 121/58 (03/25 0545) SpO2:  [93 %-96 %] 93 % (03/25 0742) Weight:  [108.2 kg] 108.2 kg (03/25 0545) Last BM Date: 05/16/20 General:    white female in NAD, juandiced Abdomen:  Soft, nontender, obese abdomen.  Psych:  Cooperative. Normal mood and affect.  Intake/Output from previous day: 03/24 0701 - 03/25 0700 In: 1546.1 [I.V.:1396.1; IV Piggyback:150] Out: 210 [Urine:210] Intake/Output this shift: No intake/output data recorded.  Lab Results: Recent Labs    05/14/20 0425 05/15/20 0350 05/16/20 0344  WBC 7.9 6.8 5.6  HGB 9.8* 8.5* 8.3*  HCT 28.8* 26.1* 24.8*  PLT 185 149* 141*   BMET Recent Labs    05/14/20 0425 05/15/20 0350 05/16/20 0344  NA 135 133* 136  K 5.4* 4.9 4.4  CL 101 100 102  CO2 25 26 25   GLUCOSE 93 104* 85  BUN 9 16 18   CREATININE 1.38* 3.15* 3.70*  CALCIUM 8.5* 8.4* 8.7*   LFT Recent Labs    05/13/20 1906 05/14/20 0425 05/16/20 0344  PROT  --    < > 6.3*  ALBUMIN  --    < > 3.1*  AST  --    < > 170*  ALT  --    < > 43  ALKPHOS  --    < > 206*  BILITOT 5.4*   < > 5.4*  BILIDIR 2.9*  --   --   IBILI 2.5*  --   --    < > = values in this interval not displayed.   PT/INR Recent Labs    05/14/20 1613  LABPROT 16.5*  INR 1.4*    Studies/Results: CT ABDOMEN PELVIS WO CONTRAST  Result Date: 05/15/2020 CLINICAL DATA:  Acute renal insufficiency, cirrhosis EXAM: CT ABDOMEN AND PELVIS WITHOUT CONTRAST TECHNIQUE: Multidetector CT imaging of the abdomen and pelvis was performed following the standard protocol without IV contrast. COMPARISON:   05/15/2020, 03/11/2013 FINDINGS: Lower chest: Patchy bibasilar airspace disease consistent with multifocal pneumonia. No effusion. Hepatobiliary: Diffuse hepatic steatosis again noted, with heterogeneous nodularity consistent with known cirrhosis. Please refer to MRI performed earlier today for detailed description of the liver parenchyma. The gallbladder is distended, with no evidence of cholelithiasis or cholecystitis. Pancreas: Diffuse pancreatic parenchymal atrophy. No focal abnormality. Spleen: Normal in size without focal abnormality. Adrenals/Urinary Tract: No urinary tract calculi or obstructive uropathy. Left adrenals unremarkable. Right adrenal calcification may reflect previous infection or hemorrhage. Bladder is decompressed, limiting evaluation. Stomach/Bowel: No bowel obstruction or ileus. No bowel wall thickening or inflammatory change. Vascular/Lymphatic: Evaluation of the vasculature is limited on this unenhanced exam. Numerous venous collaterals within the upper abdomen likely sequela of portal venous hypertension. Mild atherosclerosis of the abdominal aorta. No pathologic adenopathy. Reproductive: Status post hysterectomy. No adnexal masses. Other: Trace ascites. Body wall edema lower abdomen and pelvis unchanged since earlier MRI. No abdominal wall hernia. Musculoskeletal: No acute or destructive bony lesions. Reconstructed images demonstrate no additional findings. IMPRESSION: 1. Unremarkable unenhanced appearance of the kidneys. 2. Hepatic steatosis, with heterogeneous nodularity consistent with cirrhosis. Please refer to preceding MRI performed  earlier today for detailed description of the liver findings. 3. Patchy bibasilar airspace disease consistent with multifocal pneumonia, stable. 4. Trace ascites. 5. Stable body wall edema. 6.  Aortic Atherosclerosis (ICD10-I70.0). Electronically Signed   By: Randa Ngo M.D.   On: 05/15/2020 19:36   MR LIVER W WO CONTRAST  Result Date:  05/15/2020 CLINICAL DATA:  Cirrhosis. EXAM: MRI ABDOMEN WITHOUT AND WITH CONTRAST TECHNIQUE: Multiplanar multisequence MR imaging of the abdomen was performed both before and after the administration of intravenous contrast. CONTRAST:  13mL GADAVIST GADOBUTROL 1 MMOL/ML IV SOLN COMPARISON:  Abdominal ultrasound 05/09/2020. FINDINGS: Lower chest: Heart size upper normal. There is patchy airspace disease in both lower lungs, possibly atelectasis although pneumonia not excluded. Hepatobiliary: Heterogeneous lobular liver is compatible with cirrhosis. Heterogeneous loss of signal intensity in the liver parenchyma on out of phase T1 imaging is consistent with steatosis. Tiny foci of arterial phase hyperenhancement are identified in the anterior right liver (see images 37, 39, and 48 of series 19). No focal restricted diffusion within the liver parenchyma. Gallbladder is distended without evidence of stone disease. No intrahepatic or extrahepatic biliary dilation. Pancreas: No focal mass lesion. Diffuse loss of parenchymal volume is compatible with atrophy. No discernible main duct dilatation. Spleen:  No splenomegaly. No focal mass lesion. Adrenals/Urinary Tract: No adrenal nodule or mass. Kidneys unremarkable. Stomach/Bowel: Stomach is decompressed. Duodenum is normally positioned as is the ligament of Treitz. No small bowel or colonic dilatation within the visualized abdomen. Vascular/Lymphatic: No abdominal aortic aneurysm. No abdominal lymphadenopathy. Other:  Small volume ascites. Musculoskeletal: Diffuse body wall edema No focal suspicious marrow enhancement within the visualized bony anatomy. IMPRESSION: 1. Nodular liver compatible with reported history of cirrhosis. Tiny foci of arterial phase hyperenhancement in the anterior right liver are nonspecific ( LI-RADS 3). Follow-up MRI in 3 months recommended to ensure stability. 2. Steatosis. 3. Patchy airspace disease in both lower lungs, possibly atelectasis  although pneumonia a distinct consideration. 4. Small volume ascites. 5. Diffuse body wall edema. Electronically Signed   By: Misty Stanley M.D.   On: 05/15/2020 09:51   US RENAL  Result Date: 05/15/2020 CLINICAL DATA:  Acute renal insufficiency EXAM: RENAL / URINARY TRACT ULTRASOUND COMPLETE COMPARISON:  Abdominal sonogram 06/16/2017, concurrently performed MRI examination of the abdomen FINDINGS: Right Kidney: Renal measurements: 13.2 x 6.4 x 7.8 cm = volume: 346 mL. Visualization of the right kidney is quite poor due to overlying soft tissue and obscuration of the upper and lower pole by bowel gas. The visualized renal cortical thickness is preserved and cortical echogenicity is normal. No hydronephrosis. Left Kidney: Renal measurements: At least 10.0 x 6.3 x 6.4 cm = volume: 212 mL. The lower 1/2 of the left kidney is obscured by overlying bowel gas. The visualized upper pole the left kidney demonstrates normal cortical thickness and echogenicity. There is no hydronephrosis. Bladder: Partially decompressed. Other: None. IMPRESSION: Markedly limited, but unremarkable examination Electronically Signed   By: Fidela Salisbury MD   On: 05/15/2020 13:46   ECHOCARDIOGRAM COMPLETE  Result Date: 05/15/2020    ECHOCARDIOGRAM REPORT   Patient Name:   [name] [name] Date of Exam: 05/15/2020 Medical Rec #:  [id number]      Height:       65.0 in Accession #:    [id number]     Weight:       236.1 lb Date of Birth:  12/01/1960      BSA:  2.123 m Patient Age:    32 years       BP:           117/49 mmHg Patient Gender: F              HR:           74 bpm. Exam Location:  Inpatient Procedure: 2D Echo, Cardiac Doppler, Color Doppler and Intracardiac            Opacification Agent Indications:    CHF-Acute Diastolic H29.92  History:        Patient has no prior history of Echocardiogram examinations.                 COPD; Risk Factors:Current Smoker, Hypertension, Dyslipidemia                 and ETOH.  Sonographer:     Bernadene Person RDCS Referring Phys: [id number] [place]  1. Left ventricular ejection fraction, by estimation, is 60 to 65%. The left ventricle has normal function. The left ventricle has no regional wall motion abnormalities. Left ventricular diastolic parameters were normal.  2. Right ventricular systolic function is normal. The right ventricular size is normal. There is normal pulmonary artery systolic pressure.  3. Left atrial size was mildly dilated.  4. The mitral valve is normal in structure. Mild mitral valve regurgitation. No evidence of mitral stenosis.  5. The aortic valve is normal in structure. Aortic valve regurgitation is not visualized. No aortic stenosis is present.  6. The inferior vena cava is normal in size with greater than 50% respiratory variability, suggesting right atrial pressure of 3 mmHg. FINDINGS  Left Ventricle: Left ventricular ejection fraction, by estimation, is 60 to 65%. The left ventricle has normal function. The left ventricle has no regional wall motion abnormalities. Definity contrast agent was given IV to delineate the left ventricular  endocardial borders. The left ventricular internal cavity size was normal in size. There is no left ventricular hypertrophy. Left ventricular diastolic parameters were normal. Right Ventricle: The right ventricular size is normal. No increase in right ventricular wall thickness. Right ventricular systolic function is normal. There is normal pulmonary artery systolic pressure. Left Atrium: Left atrial size was mildly dilated. Right Atrium: Right atrial size was normal in size. Pericardium: There is no evidence of pericardial effusion. Mitral Valve: The mitral valve is normal in structure. Mild mitral valve regurgitation. No evidence of mitral valve stenosis. Tricuspid Valve: The tricuspid valve is normal in structure. Tricuspid valve regurgitation is mild . No evidence of tricuspid stenosis. Aortic Valve: The aortic valve is normal  in structure. Aortic valve regurgitation is not visualized. No aortic stenosis is present. Pulmonic Valve: The pulmonic valve was normal in structure. Pulmonic valve regurgitation is not visualized. No evidence of pulmonic stenosis. Aorta: The aortic root is normal in size and structure. Venous: The inferior vena cava is normal in size with greater than 50% respiratory variability, suggesting right atrial pressure of 3 mmHg. IAS/Shunts: No atrial level shunt detected by color flow Doppler.  LEFT VENTRICLE PLAX 2D LVIDd:         4.40 cm  Diastology LVIDs:         2.40 cm  LV e' medial:    8.92 cm/s LV PW:         0.80 cm  LV E/e' medial:  16.4 LV IVS:        0.80 cm  LV e' lateral:   9.03  cm/s LVOT diam:     1.70 cm  LV E/e' lateral: 16.2 LV SV:         83 LV SV Index:   39 LVOT Area:     2.27 cm  RIGHT VENTRICLE RV S prime:     11.50 cm/s TAPSE (M-mode): 3.0 cm LEFT ATRIUM             Index       RIGHT ATRIUM           Index LA diam:        4.10 cm 1.93 cm/m  RA Area:     12.70 cm LA Vol (A2C):   49.1 ml 23.13 ml/m RA Volume:   24.70 ml  11.64 ml/m LA Vol (A4C):   51.0 ml 24.03 ml/m LA Biplane Vol: 50.7 ml 23.88 ml/m  AORTIC VALVE LVOT Vmax:   165.00 cm/s LVOT Vmean:  113.000 cm/s LVOT VTI:    0.365 m  AORTA Ao Root diam: 2.70 cm Ao Asc diam:  2.70 cm MITRAL VALVE MV Area (PHT): 2.76 cm     SHUNTS MV Decel Time: 275 msec     Systemic VTI:  0.36 m MV E velocity: 146.00 cm/s  Systemic Diam: 1.70 cm MV A velocity: 71.00 cm/s MV E/A ratio:  2.06 Ena Dawley MD Electronically signed by Ena Dawley MD Signature Date/Time: 05/15/2020/2:31:06 PM    Final        Assessment / Plan:    60 y/o female with decompensated alcoholic cirrhosis, actively drinking, now jaundiced, MRI shows no overt evidence of HCC, AFP normal (she does have some hyperenhancement of foci in the liver, will need repeat MRI in 3 months). She has had some confusion previously, unclear of hepatic encephalopathy / withdrawal / benzo  use, but on lactulose, mental status appears improved today. The main change over the past 48 hours was marked worsening of renal function. Stopped diuretics, she received multiple doses of IV albumin yesterday. She is now making urine again which is encouraging, but Cr has continued to rise. Suspect possible HRS, appreciate nephrology input, ruling out other causes as well. CT shows no obstructive pathology. Echocardiogram looks okay.  Plan: - continue IV albumin  - defer to nephrology if midocrine / octreotide should be considered today - complete alcohol abstinence - trend LFTs, INR - continue lactulose - will need MRI in 3 months for surveillance of imaging findings noted on MRI (AFP is normal)  Call with questions. Dr. Collene Mares to cover [place] [facility] service this weekend.  [place], MD [facility]

## 2020-05-16 NOTE — Care Management Important Message (Signed)
Important Message  Patient Details  IM Letter given to the Patient. Name: DEMARA LOVER MRN: [id number] Date of Birth: 1960/06/02   Medicare Important Message Given:  Yes     Kerin Salen 05/16/2020, 11:20 AM

## 2020-05-16 NOTE — Progress Notes (Signed)
PROGRESS NOTE  [name] [name]  DOB: [birth year]  PCP: [name], [place] YIF:[id number]  DOA: 05/13/2020  LOS: 3 days   Chief Complaint  Patient presents with  . Abnormal Lab    Brief narrative: [name] [name] is a 60 y.o. female with PMH significant for HTN, chronic alcohol abuse, liver cirrhosis, anxiety/depression. Patient presented to the ED on 3/22  with complaint of bilateral lower extremity edema and abdominal distention, with 20 pounds weight gain in the last few months.   In the ED, patient was noted to have volume overload.  LFTs were elevated. Admitted to hospitalist service. GI consulted.  Subjective: Patient was seen and examined this morning.   Lying on bed.  Not in distress.  She has a Foley catheter in making some urine.  On IV fluid per nephrology. Not on alcohol withdrawal tremors.  Able to tolerate her breakfast.  Assessment/Plan: Alcoholic liver cirrhosis Superimposed alcoholic hepatitis -Presented with bilateral lower extremity edema, abdominal distention -With suspicion of volume overload, patient was given IV Lasix 40 mg.  She got a total of 2 doses.  Lasix was held later because of AKI. -Ultrasound abdomen did not reveal any ascites for paracentesis.  Patient still has 1+ bilateral pedal edema but because of AKI, cannot give diuretics at this time. -Echocardiogram showed EF of 60 to 65% without regional wall motion abnormalities. -Continue lactulose. -May benefit from beta-blocker and diuretics at discharge -GI consult appreciated.  AKI -Presented with a normal creatinine at baseline.  With initial suspicion of ascites and volume overload, she got 2 doses of IV Lasix in total initially.  Her creatinine started to worsen, urine output decreased.  Lasix was held.  Nephrology consultation obtained.  Currently on IV fluid.  Creatinine further up today but patient is starting to make urine. -Continue to monitor. Recent Labs    09/19/19 1029  11/06/19 1149 02/26/20 1515 03/07/20 1022 05/12/20 1605 05/13/20 1346 05/13/20 1906 05/14/20 0425 05/15/20 0350 05/16/20 0344  BUN 14 9 12 7  5* <5*  --  9 16 18   CREATININE 0.83 0.73 0.80 0.89 0.75 0.84 1.10* 1.38* 3.15* 3.70*   Chronic alcohol abuse  -Continues to drink. Counseled to quit.   -Continue CIWA protocol to monitor for withdrawal. -Continue Dexilant   Macrocytic anemia -Likely related to alcohol itself.  There is drop in hemoglobin from 8.5 yesterday to 8.3 today. No evidence of active bleeding. Continue to monitor. Recent Labs    09/19/19 1029 11/06/19 1149 03/07/20 1022 05/12/20 1605 05/13/20 1346 05/13/20 1906 05/14/20 0425 05/15/20 0350 05/16/20 0344  HGB 11.6*   < > 10.4*   < > 9.6* 9.5* 9.8* 8.5* 8.3*  MCV 102.0*   < > 103.1*   < > 106.8* 106.2* 108.3* 110.6* 110.2*  VITAMINB12 1,020  --  379  --   --  1,855*  --   --   --   FOLATE  --   --  3.6*  --   --  9.0  --   --   --   FERRITIN 115  --  57.3  --   --   --   --   --   --   TIBC  --   --   --   --   --  252  --   --   --   IRON  --   --  117  --   --  82  --   --   --    < > =  values in this interval not displayed.   HLD -hold statin d/t elevated LFTs  Chronic pain -On Norco 5/325 4 times a day. -Encouraged to minimize the use  Anxiety/depression -Continue Abilify, Prozac  Mobility: Encourage ambulation Code Status:   Code Status: Full Code Nutritional status: Body mass index is 39.71 kg/m.     Diet Order            Diet 2 gram sodium Room service appropriate? Yes; Fluid consistency: Thin  Diet effective now                 DVT prophylaxis: enoxaparin (LOVENOX) injection 30 mg Start: 05/15/20 2200   Antimicrobials:  None  Fluid: None Consultants: GI Family Communication:  None at bedside  Status is: Inpatient  Remains inpatient appropriate because patient needs IV fluid, renal function monitoring  Dispo: The patient is from: Home              Anticipated d/c is to:  Home in 2 to 3 days              Patient currently is not medically stable to d/c.   Difficult to place patient No       Infusions:  . sodium chloride 65 mL/hr at 05/16/20 0705  . albumin human 25 g (05/16/20 0835)    Scheduled Meds: . ARIPiprazole  30 mg Oral Daily  . aspirin EC  81 mg Oral Daily  . chlorhexidine  15 mL Mouth Rinse BID  . Chlorhexidine Gluconate Cloth  6 each Topical Daily  . enoxaparin (LOVENOX) injection  30 mg Subcutaneous Q24H  . FLUoxetine  20 mg Oral BID  . fluticasone  2 spray Each Nare QHS  . folic acid  1 mg Oral Daily  . lactulose  30 g Oral Daily  . lamoTRIgine  100 mg Oral QHS  . loratadine  10 mg Oral Daily  . montelukast  10 mg Oral QHS  . multivitamin with minerals  1 tablet Oral Daily  . pantoprazole  40 mg Oral Daily  . thiamine  100 mg Oral Daily   Or  . thiamine  100 mg Intravenous Daily  . umeclidinium bromide  1 puff Inhalation Daily    Antimicrobials: Anti-infectives (From admission, onward)   None      PRN meds: albuterol, HYDROcodone-acetaminophen, LORazepam **OR** LORazepam, magic mouthwash w/lidocaine, ondansetron (ZOFRAN) IV   Objective: Vitals:   05/16/20 0742 05/16/20 1256  BP:  (!) 143/68  Pulse:  78  Resp:  18  Temp:  98.1 F (36.7 C)  SpO2: 93% 93%    Intake/Output Summary (Last 24 hours) at 05/16/2020 1456 Last data filed at 05/16/2020 1258 Gross per 24 hour  Intake 1546.07 ml  Output 385 ml  Net 1161.07 ml   Filed Weights   05/13/20 1743 05/14/20 0400 05/16/20 0545  Weight: 108 kg 107.1 kg 108.2 kg   Weight change:  Body mass index is 39.71 kg/m.   Physical Exam: General exam: Pleasant, middle-aged, not in physical distress Skin: No rashes, lesions or ulcers. HEENT: Atraumatic, normocephalic, no obvious bleeding Lungs: Clear to auscultation bilaterally CVS: Regular rate and rhythm, no murmur GI/Abd soft, nontender, distended from ascites, bowel sound present CNS: Slightly drowsy probably  related to as needed IV Ativan.  Alert, awake, oriented x3. Psychiatry: Mood appropriate Extremities: Continues to have pedal edema 1+ bilaterally  Data Review: I have personally reviewed the laboratory data and studies available.  Recent Labs  Lab 05/12/20 1605  05/13/20 1346 05/13/20 1906 05/14/20 0425 05/15/20 0350 05/16/20 0344  WBC 7.5 6.6 7.2 7.9 6.8 5.6  NEUTROABS 4.9  --   --   --  3.7 3.1  HGB 10.4* 9.6* 9.5* 9.8* 8.5* 8.3*  HCT 30.1* 28.4* 27.5* 28.8* 26.1* 24.8*  MCV 108.2* 106.8* 106.2* 108.3* 110.6* 110.2*  PLT 130.0* 170 174 185 149* 141*   Recent Labs  Lab 05/12/20 1605 05/13/20 1346 05/13/20 1906 05/14/20 0425 05/15/20 0350 05/16/20 0344  NA 133* 137  --  135 133* 136  K 4.7 4.5  --  5.4* 4.9 4.4  CL 97 102  --  101 100 102  CO2 29 24  --  25 26 25   GLUCOSE 135* 111*  --  93 104* 85  BUN 5* <5*  --  9 16 18   CREATININE 0.75 0.84 1.10* 1.38* 3.15* 3.70*  CALCIUM 8.8 8.8*  --  8.5* 8.4* 8.7*  MG  --   --  1.6*  --   --  1.7  PHOS  --   --  3.7  --   --  3.7    F/u labs ordered Unresulted Labs (From admission, onward)          Start     Ordered   05/20/20 0500  Creatinine, serum  (enoxaparin (LOVENOX)    CrCl < 30 ml/min)  Weekly,   R     Comments: while on enoxaparin therapy.    05/13/20 1852   05/17/20 0500  Protime-INR Once  Tomorrow morning,   R        05/16/20 1136   05/15/20 0500  CBC with Differential/Platelet  Daily,   R      05/14/20 1111   05/15/20 0500  Comprehensive metabolic panel  Daily,   R      05/14/20 1111   05/13/20 1853  Vitamin B1  Once,   R        05/13/20 1852          Signed, Terrilee Croak, MD [facility] 05/16/2020

## 2020-05-16 NOTE — Progress Notes (Signed)
[facility] Progress Note  Subjective: creat up to 3.5 today, UOP 375 cc today, foley in place  Vitals:   05/16/20 0055 05/16/20 0545 05/16/20 0742 05/16/20 1256  BP: 125/60 (!) 121/58  (!) 143/68  Pulse: 69 75  78  Resp: $Remo'18 20  18  'TbDpT$ Temp:  97.9 F (36.6 C)  98.1 F (36.7 C)  TempSrc:  Oral  Oral  SpO2: 96% 95% 93% 93%  Weight:  108.2 kg    Height:        Exam:  alert, nad   no jvd  Chest cta bilat  Cor reg no RG  Abd soft ntnd no ascites   Ext 1-2+ pitting pretib/ hip edema, mild abd wall edema    Neuro is alert, Ox 3 , nf, no asterixis       Home meds:  - abilify 30 qd/ prozac 20 bid/ lamictal 100 hs/ norco prn/ robaxin prn/ ultram tid prn  - lasix 20 bid prn/ lopressor 25 bid/ losartan 50 qd/ asa 81/ lipitor 40 qd  - dexilant 60 qd/ flomax 0.4 qd  - singulair 10 qd/ spiriva respimat 2 puff qd  - prn's/ vitamins/ supplements     Renal US 3/24 - bilat kidneys were not fully visualized d/t overlying bowel gas. R 13.2 cm and L 10.0 cm, no obvious hydro or mass.    UA 3/22 - 6-10 wbc, 11-20 epi, no rbc, protein 30, neg Hb, 50 glu, turbid and amber, +small bili   CXR 3/21 - IMPRESSION: 1. Diffuse interstitial and bronchial thickening which may be due to bronchial inflammation or congestive. 2. Additional patchy bilateral perihilar opacities, more typical of multifocal infection. Recommend correlation for infectious symptoms.    Na 133 K 4.9  CO2 26  BUN 16  Cr 3.15  eGFR  16   AST 193/ ALT 48     Tbili 5.1  GGT 624  AG 7- 11 wnl  Alb 2.3  Mg 1.6  Phos 3.7    WBC 6.8K   Hb 8.5 MCV 110  plt 149    ECHO 3/23 >> 1. Left ventricular ejection fraction, by estimation, is 60 to 65%. The  left ventricle has normal function. The left ventricle has no regional  wall motion abnormalities. No LVH. Left ventricular diastolic parameters were  Normal.         BP's here lowest was 108/ 70, highest 144/70   RR 18- 24, HR 70- 88 range  afebrile    Wt 109 admit, 107kg  today     UNa < 10, UCr 153  Assessment/ Plan: 1. AKI - creat 0.8 on admit, up to 1.5 yest and 3.1 today. Not making urine. Gradual onset of LE edema and abd swelling over last month. Not sure etiology. Dry mouth/ flat neck veins on exam but also + leg edema and ascites. BP's are normal, not low. Possibilities are HRS and/or hypovolemia d/t diuretics. CT shows normal kidneys w/o signs of obstruction. ECHO normal. Renal US poor quality. Cont gentle IVF"s for poss intravasc hypovolemia and cont IV albumin. Avoid diuretics for now.   2. Cirrhosis 3. ETOH abuse 4. HTN - home BP lowering meds on hold here (ARB, BB) 5. COPD 6. Anxiety/ depression - per pmd       Rob Schertz 05/16/2020, 4:10 PM   Recent Labs  Lab 05/13/20 1906 05/14/20 0425 05/15/20 0350 05/16/20 0344  K  --    < > 4.9 4.4  BUN  --    < >  16 18  CREATININE 1.10*   < > 3.15* 3.70*  CALCIUM  --    < > 8.4* 8.7*  PHOS 3.7  --   --  3.7  HGB 9.5*   < > 8.5* 8.3*   < > = values in this interval not displayed.   Inpatient medications: . ARIPiprazole  30 mg Oral Daily  . aspirin EC  81 mg Oral Daily  . chlorhexidine  15 mL Mouth Rinse BID  . Chlorhexidine Gluconate Cloth  6 each Topical Daily  . enoxaparin (LOVENOX) injection  30 mg Subcutaneous Q24H  . FLUoxetine  20 mg Oral BID  . fluticasone  2 spray Each Nare QHS  . folic acid  1 mg Oral Daily  . lactulose  30 g Oral Daily  . lamoTRIgine  100 mg Oral QHS  . loratadine  10 mg Oral Daily  . montelukast  10 mg Oral QHS  . multivitamin with minerals  1 tablet Oral Daily  . pantoprazole  40 mg Oral Daily  . thiamine  100 mg Oral Daily   Or  . thiamine  100 mg Intravenous Daily  . umeclidinium bromide  1 puff Inhalation Daily   . sodium chloride 65 mL/hr at 05/16/20 0705  . albumin human 25 g (05/16/20 0835)   albuterol, HYDROcodone-acetaminophen, LORazepam **OR** LORazepam, magic mouthwash w/lidocaine, ondansetron (ZOFRAN) IV

## 2020-05-16 NOTE — Plan of Care (Signed)
  Problem: Health Behavior/Discharge Planning: Goal: Ability to manage health-related needs will improve Outcome: Progressing   Problem: Activity: Goal: Risk for activity intolerance will decrease Outcome: Progressing   Problem: Elimination: Goal: Will not experience complications related to urinary retention Outcome: Progressing   Problem: Coping: Goal: Level of anxiety will decrease Outcome: Progressing

## 2020-05-17 LAB — CBC WITH DIFFERENTIAL/PLATELET
Abs Immature Granulocytes: 0.02 10*3/uL (ref 0.00–0.07)
Basophils Absolute: 0.1 10*3/uL (ref 0.0–0.1)
Basophils Relative: 1 %
Eosinophils Absolute: 0.3 10*3/uL (ref 0.0–0.5)
Eosinophils Relative: 5 %
HCT: 26.9 % — ABNORMAL LOW (ref 36.0–46.0)
Hemoglobin: 9 g/dL — ABNORMAL LOW (ref 12.0–15.0)
Immature Granulocytes: 0 %
Lymphocytes Relative: 23 %
Lymphs Abs: 1.3 10*3/uL (ref 0.7–4.0)
MCH: 36.6 pg — ABNORMAL HIGH (ref 26.0–34.0)
MCHC: 33.5 g/dL (ref 30.0–36.0)
MCV: 109.3 fL — ABNORMAL HIGH (ref 80.0–100.0)
Monocytes Absolute: 0.6 10*3/uL (ref 0.1–1.0)
Monocytes Relative: 10 %
Neutro Abs: 3.6 10*3/uL (ref 1.7–7.7)
Neutrophils Relative %: 61 %
Platelets: 155 10*3/uL (ref 150–400)
RBC: 2.46 MIL/uL — ABNORMAL LOW (ref 3.87–5.11)
RDW: 21 % — ABNORMAL HIGH (ref 11.5–15.5)
WBC: 5.8 10*3/uL (ref 4.0–10.5)
nRBC: 0 % (ref 0.0–0.2)

## 2020-05-17 LAB — COMPREHENSIVE METABOLIC PANEL
ALT: 40 U/L (ref 0–44)
AST: 138 U/L — ABNORMAL HIGH (ref 15–41)
Albumin: 3.4 g/dL — ABNORMAL LOW (ref 3.5–5.0)
Alkaline Phosphatase: 202 U/L — ABNORMAL HIGH (ref 38–126)
Anion gap: 10 (ref 5–15)
BUN: 18 mg/dL (ref 6–20)
CO2: 22 mmol/L (ref 22–32)
Calcium: 8.8 mg/dL — ABNORMAL LOW (ref 8.9–10.3)
Chloride: 104 mmol/L (ref 98–111)
Creatinine, Ser: 2.93 mg/dL — ABNORMAL HIGH (ref 0.44–1.00)
GFR, Estimated: 18 mL/min — ABNORMAL LOW (ref >60)
Glucose, Bld: 94 mg/dL (ref 70–99)
Potassium: 4.3 mmol/L (ref 3.5–5.1)
Sodium: 136 mmol/L (ref 135–145)
Total Bilirubin: 5.3 mg/dL — ABNORMAL HIGH (ref 0.3–1.2)
Total Protein: 6.6 g/dL (ref 6.5–8.1)

## 2020-05-17 LAB — PROTIME-INR
INR: 1.4 — ABNORMAL HIGH (ref 0.8–1.2)
Prothrombin Time: 16.8 seconds — ABNORMAL HIGH (ref 11.4–15.2)

## 2020-05-17 MED ORDER — OCTREOTIDE ACETATE 100 MCG/ML IJ SOLN
200.0000 ug | Freq: Three times a day (TID) | INTRAMUSCULAR | Status: DC
  Administered 2020-05-17 – 2020-05-19 (×7): 200 ug via SUBCUTANEOUS
  Filled 2020-05-17 (×7): qty 2

## 2020-05-17 MED ORDER — PHENAZOPYRIDINE HCL 100 MG PO TABS
100.0000 mg | ORAL_TABLET | Freq: Once | ORAL | Status: AC
  Administered 2020-05-17: 100 mg via ORAL
  Filled 2020-05-17: qty 1

## 2020-05-17 MED ORDER — MIDODRINE HCL 5 MG PO TABS
10.0000 mg | ORAL_TABLET | Freq: Three times a day (TID) | ORAL | Status: DC
  Administered 2020-05-17 – 2020-05-18 (×5): 10 mg via ORAL
  Filled 2020-05-17 (×5): qty 2

## 2020-05-17 NOTE — Progress Notes (Signed)
PROGRESS NOTE  [name] [name]  DOB: 1960/12/30  PCP: [name], [place] HYW:[id number]  DOA: 05/13/2020  LOS: 4 days   Chief Complaint  Patient presents with  . Abnormal Lab    Brief narrative: [name] [name] is a 60 y.o. female with PMH significant for HTN, chronic alcohol abuse, liver cirrhosis, anxiety/depression. Patient presented to the ED on 3/22  with complaint of bilateral lower extremity edema and abdominal distention, with 20 pounds weight gain in the last few months.   In the ED, patient was noted to have volume overload.  LFTs were elevated. Admitted to hospitalist service. GI consulted.  Subjective: Patient was seen and examined this morning.   Propped up in bed.  Drowsy.  Not in distress.  Making urine.  Creatinine improving.  Assessment/Plan: Alcoholic liver cirrhosis Superimposed alcoholic hepatitis -Presented with bilateral lower extremity edema, abdominal distention -With suspicion of volume overload, patient was given IV Lasix 40 mg.  She got a total of 2 doses. Lasix was held later because of AKI. -Ultrasound abdomen did not reveal any ascites for paracentesis. Patient still has 1+ bilateral pedal edema but because of AKI, unable to give diuretics. -Echocardiogram showed EF of 60 to 65% without regional wall motion abnormalities. -Continue lactulose. -May benefit from beta-blocker and diuretics at discharge -GI consult appreciated.  AKI -Hypovolemia versus hepatorenal syndrome -Presented with a normal creatinine at baseline.  With initial suspicion of ascites and volume overload, she got 2 doses of IV Lasix in total initially.  Her creatinine started to worsen, urine output decreased.  Lasix was held.  Nephrology consultation obtained.  Tried and IV fluid.  Renal function gradually improving.  Nephrology has started patient on midodrine octreotide perHRS protocol. -Continue to monitor. Recent Labs    09/19/19 1029 11/06/19 1149 02/26/20 1515  03/07/20 1022 05/12/20 1605 05/13/20 1346 05/13/20 1906 05/14/20 0425 05/15/20 0350 05/16/20 0344 05/17/20 0523  BUN 14 9 12 7  5* <5*  --  9 16 18 18   CREATININE 0.83 0.73 0.80 0.89 0.75 0.84 1.10* 1.38* 3.15* 3.70* 2.93*   Chronic alcohol abuse  -Continues to drink. Counseled to quit.   -Continue CIWA protocol to monitor for withdrawal. -Continue Dexilant   Macrocytic anemia -Likely related to alcohol itself.  Hemoglobin remains low but stable. Recent Labs    09/19/19 1029 11/06/19 1149 03/07/20 1022 05/12/20 1605 05/13/20 1906 05/14/20 0425 05/15/20 0350 05/16/20 0344 05/17/20 0523  HGB 11.6*   < > 10.4*   < > 9.5* 9.8* 8.5* 8.3* 9.0*  MCV 102.0*   < > 103.1*   < > 106.2* 108.3* 110.6* 110.2* 109.3*  VITAMINB12 1,020  --  379  --  1,855*  --   --   --   --   FOLATE  --   --  3.6*  --  9.0  --   --   --   --   FERRITIN 115  --  57.3  --   --   --   --   --   --   TIBC  --   --   --   --  252  --   --   --   --   IRON  --   --  117  --  82  --   --   --   --    < > = values in this interval not displayed.   HLD -hold statin d/t elevated LFTs  Chronic pain -On Norco  5/325 4 times a day. -Encouraged to minimize the use  Anxiety/depression -Continue Abilify, Prozac  Mobility: Encourage ambulation Code Status:   Code Status: Full Code Nutritional status: Body mass index is 40.47 kg/m.     Diet Order            Diet 2 gram sodium Room service appropriate? Yes; Fluid consistency: Thin  Diet effective now                 DVT prophylaxis: enoxaparin (LOVENOX) injection 30 mg Start: 05/15/20 2200   Antimicrobials:  None  Fluid: None Consultants: GI, nephrology Family Communication:  None at bedside  Status is: Inpatient  Remains inpatient appropriate because patient needs IV fluid, renal function monitoring  Dispo: The patient is from: Home              Anticipated d/c is to: Home in 2 to 3 days              Patient currently is not medically  stable to d/c.   Difficult to place patient No       Infusions:    Scheduled Meds: . ARIPiprazole  30 mg Oral Daily  . aspirin EC  81 mg Oral Daily  . chlorhexidine  15 mL Mouth Rinse BID  . Chlorhexidine Gluconate Cloth  6 each Topical Daily  . enoxaparin (LOVENOX) injection  30 mg Subcutaneous Q24H  . FLUoxetine  20 mg Oral BID  . fluticasone  2 spray Each Nare QHS  . folic acid  1 mg Oral Daily  . lactulose  30 g Oral Daily  . lamoTRIgine  100 mg Oral QHS  . loratadine  10 mg Oral Daily  . midodrine  10 mg Oral TID WC  . montelukast  10 mg Oral QHS  . multivitamin with minerals  1 tablet Oral Daily  . octreotide  200 mcg Subcutaneous Q8H  . pantoprazole  40 mg Oral Daily  . thiamine  100 mg Oral Daily   Or  . thiamine  100 mg Intravenous Daily  . umeclidinium bromide  1 puff Inhalation Daily    Antimicrobials: Anti-infectives (From admission, onward)   None      PRN meds: albuterol, HYDROcodone-acetaminophen, magic mouthwash w/lidocaine, ondansetron (ZOFRAN) IV   Objective: Vitals:   05/17/20 0759 05/17/20 1426  BP:  (!) 148/71  Pulse:  69  Resp:  18  Temp:  98.1 F (36.7 C)  SpO2: 93% 96%    Intake/Output Summary (Last 24 hours) at 05/17/2020 1457 Last data filed at 05/17/2020 1215 Gross per 24 hour  Intake 1636.94 ml  Output 525 ml  Net 1111.94 ml   Filed Weights   05/14/20 0400 05/16/20 0545 05/17/20 0500  Weight: 107.1 kg 108.2 kg 110.3 kg   Weight change: 2.072 kg Body mass index is 40.47 kg/m.   Physical Exam: General exam: Pleasant, middle-aged, not in physical distress Skin: No rashes, lesions or ulcers. HEENT: Atraumatic, normocephalic, no obvious bleeding Lungs: Clear to auscultation bilaterally CVS: Regular rate and rhythm, no murmur GI/Abd soft, nontender, distended from ascites, bowel sound present CNS: Slightly drowsy probably related to as needed IV Ativan.  Alert, awake, oriented x3. Psychiatry: Mood  appropriate Extremities: Continues to have pedal edema 1+ bilaterally  Data Review: I have personally reviewed the laboratory data and studies available.  Recent Labs  Lab 05/12/20 1605 05/13/20 1346 05/13/20 1906 05/14/20 0425 05/15/20 0350 05/16/20 0344 05/17/20 0523  WBC 7.5   < > 7.2  7.9 6.8 5.6 5.8  NEUTROABS 4.9  --   --   --  3.7 3.1 3.6  HGB 10.4*   < > 9.5* 9.8* 8.5* 8.3* 9.0*  HCT 30.1*   < > 27.5* 28.8* 26.1* 24.8* 26.9*  MCV 108.2*   < > 106.2* 108.3* 110.6* 110.2* 109.3*  PLT 130.0*   < > 174 185 149* 141* 155   < > = values in this interval not displayed.   Recent Labs  Lab 05/13/20 1346 05/13/20 1906 05/14/20 0425 05/15/20 0350 05/16/20 0344 05/17/20 0523  NA 137  --  135 133* 136 136  K 4.5  --  5.4* 4.9 4.4 4.3  CL 102  --  101 100 102 104  CO2 24  --  25 26 25 22   GLUCOSE 111*  --  93 104* 85 94  BUN <5*  --  9 16 18 18   CREATININE 0.84 1.10* 1.38* 3.15* 3.70* 2.93*  CALCIUM 8.8*  --  8.5* 8.4* 8.7* 8.8*  MG  --  1.6*  --   --  1.7  --   PHOS  --  3.7  --   --  3.7  --     F/u labs ordered Unresulted Labs (From admission, onward)          Start     Ordered   05/20/20 0500  Creatinine, serum  (enoxaparin (LOVENOX)    CrCl < 30 ml/min)  Weekly,   R     Comments: while on enoxaparin therapy.    05/13/20 1852   05/13/20 1853  Vitamin B1  Once,   R        05/13/20 1852          Signed, Terrilee Croak, MD [facility] 05/17/2020

## 2020-05-17 NOTE — Progress Notes (Addendum)
[facility] Progress Note  Subjective: creat down to 2.93 today. UOP about 450 cc yest. Foley in place. BP's low normal.   Vitals:   05/16/20 2100 05/17/20 0500 05/17/20 0501 05/17/20 0759  BP:   118/67   Pulse:   81   Resp: 19  20   Temp:   98.3 F (36.8 C)   TempSrc:   Oral   SpO2:   94% 93%  Weight:  110.3 kg    Height:        Exam:  alert, nad   no jvd  Chest cta bilat  Cor reg no RG  Abd soft ntnd no ascites   Ext 1-2+ pitting pretib/ hip edema, 1-2+ abd wall edema    Neuro is alert, Ox 3 , nf, no asterixis       Home meds:  - abilify 30 qd/ prozac 20 bid/ lamictal 100 hs/ norco prn/ robaxin prn/ ultram tid prn  - lasix 20 bid prn/ lopressor 25 bid/ losartan 50 qd/ asa 81/ lipitor 40 qd  - dexilant 60 qd/ flomax 0.4 qd  - singulair 10 qd/ spiriva respimat 2 puff qd  - prn's/ vitamins/ supplements     Renal US 3/24 - bilat kidneys were not fully visualized d/t overlying bowel gas. R 13.2 cm and L 10.0 cm, no obvious hydro or mass.    UA 3/22 - 6-10 wbc, 11-20 epi, no rbc, protein 30, neg Hb, 50 glu, turbid and amber, +small bili   CXR 3/21 - IMPRESSION: 1. Diffuse interstitial and bronchial thickening which may be due to bronchial inflammation or congestive. 2. Additional patchy bilateral perihilar opacities, more typical of multifocal infection. Recommend correlation for infectious symptoms.   Alb 2.3      ECHO 3/23 >> 1. Left ventricular ejection fraction, by estimation, is 60 to 65%. The  left ventricle has normal function. The left ventricle has no regional  wall motion abnormalities. No LVH. Left ventricular diastolic parameters were  Normal.         BP's here lowest was 108/ 70, highest 144/70   RR 18- 24, HR 70- 88 range  afebrile    Wt 109 admit, 107kg today     UNa < 10, UCr 153  Assessment/ Plan: 1. AKI - creat 0.8 on admit, up to 1.5 yest and 3.1 > 3.7 and down to 2.9 today. Suspect relative hypovolemia d/t diuretics and/or  hepatorenal syndrome. CT w/o signs of obstruction. ECHO normal. Will start Rx for HRS w/ octreotide SQ tid and po midodrine10- 20 mg tid. DC'd IVF"s. She has already rec'd  2 days of IV albumin qid. Seems to be improving a bit today. Will follow.  2. Cirrhosis - seen by GI, decomp etoh cirrhosis 3. ETOH abuse 4. HTN - home meds on hold here (ARB, BB) 5. COPD 6. Anxiety/ depression - per pmd       [name] [name] 05/17/2020, 12:57 PM   Recent Labs  Lab 05/13/20 1906 05/14/20 0425 05/16/20 0344 05/17/20 0523  K  --    < > 4.4 4.3  BUN  --    < > 18 18  CREATININE 1.10*   < > 3.70* 2.93*  CALCIUM  --    < > 8.7* 8.8*  PHOS 3.7  --  3.7  --   HGB 9.5*   < > 8.3* 9.0*   < > = values in this interval not displayed.   Inpatient medications: . ARIPiprazole  30 mg  Oral Daily  . aspirin EC  81 mg Oral Daily  . chlorhexidine  15 mL Mouth Rinse BID  . Chlorhexidine Gluconate Cloth  6 each Topical Daily  . enoxaparin (LOVENOX) injection  30 mg Subcutaneous Q24H  . FLUoxetine  20 mg Oral BID  . fluticasone  2 spray Each Nare QHS  . folic acid  1 mg Oral Daily  . lactulose  30 g Oral Daily  . lamoTRIgine  100 mg Oral QHS  . loratadine  10 mg Oral Daily  . midodrine  10 mg Oral TID WC  . montelukast  10 mg Oral QHS  . multivitamin with minerals  1 tablet Oral Daily  . octreotide  200 mcg Subcutaneous Q8H  . pantoprazole  40 mg Oral Daily  . thiamine  100 mg Oral Daily   Or  . thiamine  100 mg Intravenous Daily  . umeclidinium bromide  1 puff Inhalation Daily    albuterol, HYDROcodone-acetaminophen, magic mouthwash w/lidocaine, ondansetron (ZOFRAN) IV

## 2020-05-17 NOTE — Plan of Care (Signed)

## 2020-05-17 NOTE — Progress Notes (Addendum)
[facility] Subjective: Patient is a 60 year old white female with history of alcohol abuse, HTN, anxiety and depression, who presents the hospital with decompensated cirrhosis.  She has a history of alcohol withdrawal in the past with seizures in 2018 she complained of increasing abdominal distention and worsening edema of lower legs over the last few weeks and a 20 pound weight gain in the last month. An abdominal ultrasound done on 05/11/2020 shows minimal ascites in all 4 quadrants she received some IV Lasix on admission was found to have worsening of her renal function which is now being followed closely. Her creatinine has improved slightly today and is at 2.93 down from 3.5 yesterday. She has received multiple doses of albumin.  Objective: Vital signs in last 24 hours: Temp:  [98.1 F (36.7 C)-98.3 F (36.8 C)] 98.3 F (36.8 C) (03/26 0501) Pulse Rate:  [78-81] 81 (03/26 0501) Resp:  [18-20] 20 (03/26 0501) BP: (118-143)/(67-68) 118/67 (03/26 0501) SpO2:  [93 %-94 %] 93 % (03/26 0759) Weight:  [110.3 kg] 110.3 kg (03/26 0500) Last BM Date: 05/16/20  Intake/Output from previous day: 03/25 0701 - 03/26 0700 In: 1396.9 [I.V.:1196.9; IV Piggyback:200] Out: 450 [Urine:450] Intake/Output this shift: No intake/output data recorded.  General appearance: cooperative, appears stated age, fatigued, morbidly obese, pale, tremulous, oriented x3 but somewhat slow to respond to questions; anasarca.  Resp: clear to auscultation bilaterally Cardio: regular rate and rhythm, S1, S2 normal, no murmur, click, rub or gallop GI: soft, morbidly obese with hypoactive bowel sounds normal; no masses,  no organomegaly; no shifting dullness noted on exam; no evidence of ascites Extremities: edema 2+  Lab Results: Recent Labs    05/15/20 0350 05/16/20 0344 05/17/20 0523  WBC 6.8 5.6 5.8  HGB 8.5* 8.3* 9.0*  HCT 26.1* 24.8* 26.9*  PLT 149* 141* 155   BMET Recent Labs    05/15/20 0350  05/16/20 0344 05/17/20 0523  NA 133* 136 136  K 4.9 4.4 4.3  CL 100 102 104  CO2 26 25 22   GLUCOSE 104* 85 94  BUN 16 18 18   CREATININE 3.15* 3.70* 2.93*  CALCIUM 8.4* 8.7* 8.8*   LFT Recent Labs    05/17/20 0523  PROT 6.6  ALBUMIN 3.4*  AST 138*  ALT 40  ALKPHOS 202*  BILITOT 5.3*   PT/INR Recent Labs    05/14/20 1613 05/17/20 0523  LABPROT 16.5* 16.8*  INR 1.4* 1.4*    Studies/Results: CT ABDOMEN PELVIS WO CONTRAST  Result Date: 05/15/2020 CLINICAL DATA:  Acute renal insufficiency, cirrhosis EXAM: CT ABDOMEN AND PELVIS WITHOUT CONTRAST TECHNIQUE: Multidetector CT imaging of the abdomen and pelvis was performed following the standard protocol without IV contrast. COMPARISON:  05/15/2020, 03/11/2013 FINDINGS: Lower chest: Patchy bibasilar airspace disease consistent with multifocal pneumonia. No effusion. Hepatobiliary: Diffuse hepatic steatosis again noted, with heterogeneous nodularity consistent with known cirrhosis. Please refer to MRI performed earlier today for detailed description of the liver parenchyma. The gallbladder is distended, with no evidence of cholelithiasis or cholecystitis. Pancreas: Diffuse pancreatic parenchymal atrophy. No focal abnormality. Spleen: Normal in size without focal abnormality. Adrenals/Urinary Tract: No urinary tract calculi or obstructive uropathy. Left adrenals unremarkable. Right adrenal calcification may reflect previous infection or hemorrhage. Bladder is decompressed, limiting evaluation. Stomach/Bowel: No bowel obstruction or ileus. No bowel wall thickening or inflammatory change. Vascular/Lymphatic: Evaluation of the vasculature is limited on this unenhanced exam. Numerous venous collaterals within the upper abdomen likely sequela of portal venous hypertension. Mild atherosclerosis of the abdominal  aorta. No pathologic adenopathy. Reproductive: Status post hysterectomy. No adnexal masses. Other: Trace ascites. Body wall edema lower  abdomen and pelvis unchanged since earlier MRI. No abdominal wall hernia. Musculoskeletal: No acute or destructive bony lesions. Reconstructed images demonstrate no additional findings. IMPRESSION: 1. Unremarkable unenhanced appearance of the kidneys. 2. Hepatic steatosis, with heterogeneous nodularity consistent with cirrhosis. Please refer to preceding MRI performed earlier today for detailed description of the liver findings. 3. Patchy bibasilar airspace disease consistent with multifocal pneumonia, stable. 4. Trace ascites. 5. Stable body wall edema. 6.  Aortic Atherosclerosis (ICD10-I70.0). Electronically Signed   By: Randa Ngo M.D.   On: 05/15/2020 19:36   US RENAL  Result Date: 05/15/2020 CLINICAL DATA:  Acute renal insufficiency EXAM: RENAL / URINARY TRACT ULTRASOUND COMPLETE COMPARISON:  Abdominal sonogram 06/16/2017, concurrently performed MRI examination of the abdomen FINDINGS: Right Kidney: Renal measurements: 13.2 x 6.4 x 7.8 cm = volume: 346 mL. Visualization of the right kidney is quite poor due to overlying soft tissue and obscuration of the upper and lower pole by bowel gas. The visualized renal cortical thickness is preserved and cortical echogenicity is normal. No hydronephrosis. Left Kidney: Renal measurements: At least 10.0 x 6.3 x 6.4 cm = volume: 212 mL. The lower 1/2 of the left kidney is obscured by overlying bowel gas. The visualized upper pole the left kidney demonstrates normal cortical thickness and echogenicity. There is no hydronephrosis. Bladder: Partially decompressed. Other: None. IMPRESSION: Markedly limited, but unremarkable examination Electronically Signed   By: Fidela Salisbury MD   On: 05/15/2020 13:46   ECHOCARDIOGRAM COMPLETE  Result Date: 05/15/2020    ECHOCARDIOGRAM REPORT   Patient Name:   [name] [name] Date of Exam: 05/15/2020 Medical Rec #:  [id number]      Height:       65.0 in Accession #:    [id number]     Weight:       236.1 lb Date of Birth:  09-18-1960       BSA:          2.123 m Patient Age:    42 years       BP:           117/49 mmHg Patient Gender: F              HR:           74 bpm. Exam Location:  Inpatient Procedure: 2D Echo, Cardiac Doppler, Color Doppler and Intracardiac            Opacification Agent Indications:    CHF-Acute Diastolic Y81.44  History:        Patient has no prior history of Echocardiogram examinations.                 COPD; Risk Factors:Current Smoker, Hypertension, Dyslipidemia                 and ETOH.  Sonographer:    Bernadene Person RDCS Referring Phys: [id number] [place]  1. Left ventricular ejection fraction, by estimation, is 60 to 65%. The left ventricle has normal function. The left ventricle has no regional wall motion abnormalities. Left ventricular diastolic parameters were normal.  2. Right ventricular systolic function is normal. The right ventricular size is normal. There is normal pulmonary artery systolic pressure.  3. Left atrial size was mildly dilated.  4. The mitral valve is normal in structure. Mild mitral valve regurgitation. No evidence of mitral stenosis.  5. The aortic  valve is normal in structure. Aortic valve regurgitation is not visualized. No aortic stenosis is present.  6. The inferior vena cava is normal in size with greater than 50% respiratory variability, suggesting right atrial pressure of 3 mmHg. FINDINGS  Left Ventricle: Left ventricular ejection fraction, by estimation, is 60 to 65%. The left ventricle has normal function. The left ventricle has no regional wall motion abnormalities. Definity contrast agent was given IV to delineate the left ventricular  endocardial borders. The left ventricular internal cavity size was normal in size. There is no left ventricular hypertrophy. Left ventricular diastolic parameters were normal. Right Ventricle: The right ventricular size is normal. No increase in right ventricular wall thickness. Right ventricular systolic function is normal. There is  normal pulmonary artery systolic pressure. Left Atrium: Left atrial size was mildly dilated. Right Atrium: Right atrial size was normal in size. Pericardium: There is no evidence of pericardial effusion. Mitral Valve: The mitral valve is normal in structure. Mild mitral valve regurgitation. No evidence of mitral valve stenosis. Tricuspid Valve: The tricuspid valve is normal in structure. Tricuspid valve regurgitation is mild . No evidence of tricuspid stenosis. Aortic Valve: The aortic valve is normal in structure. Aortic valve regurgitation is not visualized. No aortic stenosis is present. Pulmonic Valve: The pulmonic valve was normal in structure. Pulmonic valve regurgitation is not visualized. No evidence of pulmonic stenosis. Aorta: The aortic root is normal in size and structure. Venous: The inferior vena cava is normal in size with greater than 50% respiratory variability, suggesting right atrial pressure of 3 mmHg. IAS/Shunts: No atrial level shunt detected by color flow Doppler.  LEFT VENTRICLE PLAX 2D LVIDd:         4.40 cm  Diastology LVIDs:         2.40 cm  LV e' medial:    8.92 cm/s LV PW:         0.80 cm  LV E/e' medial:  16.4 LV IVS:        0.80 cm  LV e' lateral:   9.03 cm/s LVOT diam:     1.70 cm  LV E/e' lateral: 16.2 LV SV:         83 LV SV Index:   39 LVOT Area:     2.27 cm  RIGHT VENTRICLE RV S prime:     11.50 cm/s TAPSE (M-mode): 3.0 cm LEFT ATRIUM             Index       RIGHT ATRIUM           Index LA diam:        4.10 cm 1.93 cm/m  RA Area:     12.70 cm LA Vol (A2C):   49.1 ml 23.13 ml/m RA Volume:   24.70 ml  11.64 ml/m LA Vol (A4C):   51.0 ml 24.03 ml/m LA Biplane Vol: 50.7 ml 23.88 ml/m  AORTIC VALVE LVOT Vmax:   165.00 cm/s LVOT Vmean:  113.000 cm/s LVOT VTI:    0.365 m  AORTA Ao Root diam: 2.70 cm Ao Asc diam:  2.70 cm MITRAL VALVE MV Area (PHT): 2.76 cm     SHUNTS MV Decel Time: 275 msec     Systemic VTI:  0.36 m MV E velocity: 146.00 cm/s  Systemic Diam: 1.70 cm MV A velocity:  71.00 cm/s MV E/A ratio:  2.06 Ena Dawley MD Electronically signed by Ena Dawley MD Signature Date/Time: 05/15/2020/2:31:06 PM    Final    Medications:  I have reviewed the patient's current medications.  Assessment/Plan: 1) Decompensated alcoholic cirrhosis, anasarca with ?alcoholic hepatitis in the face of ongoing alcohol abuse & ??HRS-on midodrine and octreotide. 2) Acute kidney injury-being closely followed by Dr. Jonnie Finner. 3) Hypertension-all meds are on hold for now. 4) COPD. 5) History of anxiety and depression. 6) Morbid obesity.    LOS: 4 days   [name] [name] 05/17/2020, 8:48 AM

## 2020-05-18 ENCOUNTER — Inpatient Hospital Stay (HOSPITAL_COMMUNITY): Payer: PPO

## 2020-05-18 LAB — BASIC METABOLIC PANEL
Anion gap: 10 (ref 5–15)
BUN: 16 mg/dL (ref 6–20)
CO2: 23 mmol/L (ref 22–32)
Calcium: 9.3 mg/dL (ref 8.9–10.3)
Chloride: 102 mmol/L (ref 98–111)
Creatinine, Ser: 1.78 mg/dL — ABNORMAL HIGH (ref 0.44–1.00)
GFR, Estimated: 32 mL/min — ABNORMAL LOW (ref >60)
Glucose, Bld: 155 mg/dL — ABNORMAL HIGH (ref 70–99)
Potassium: 4.6 mmol/L (ref 3.5–5.1)
Sodium: 135 mmol/L (ref 135–145)

## 2020-05-18 LAB — CBC WITH DIFFERENTIAL/PLATELET
Abs Immature Granulocytes: 0.02 10*3/uL (ref 0.00–0.07)
Basophils Absolute: 0.1 10*3/uL (ref 0.0–0.1)
Basophils Relative: 1 %
Eosinophils Absolute: 0.1 10*3/uL (ref 0.0–0.5)
Eosinophils Relative: 2 %
HCT: 25.7 % — ABNORMAL LOW (ref 36.0–46.0)
Hemoglobin: 8.6 g/dL — ABNORMAL LOW (ref 12.0–15.0)
Immature Granulocytes: 0 %
Lymphocytes Relative: 15 %
Lymphs Abs: 1.1 10*3/uL (ref 0.7–4.0)
MCH: 36.9 pg — ABNORMAL HIGH (ref 26.0–34.0)
MCHC: 33.5 g/dL (ref 30.0–36.0)
MCV: 110.3 fL — ABNORMAL HIGH (ref 80.0–100.0)
Monocytes Absolute: 0.7 10*3/uL (ref 0.1–1.0)
Monocytes Relative: 9 %
Neutro Abs: 5.4 10*3/uL (ref 1.7–7.7)
Neutrophils Relative %: 73 %
Platelets: 152 10*3/uL (ref 150–400)
RBC: 2.33 MIL/uL — ABNORMAL LOW (ref 3.87–5.11)
RDW: 20.4 % — ABNORMAL HIGH (ref 11.5–15.5)
WBC: 7.4 10*3/uL (ref 4.0–10.5)
nRBC: 0 % (ref 0.0–0.2)

## 2020-05-18 MED ORDER — FUROSEMIDE 10 MG/ML IJ SOLN
60.0000 mg | Freq: Two times a day (BID) | INTRAMUSCULAR | Status: DC
  Administered 2020-05-18 – 2020-05-19 (×3): 60 mg via INTRAVENOUS
  Filled 2020-05-18 (×3): qty 6

## 2020-05-18 MED ORDER — ENOXAPARIN SODIUM 40 MG/0.4ML ~~LOC~~ SOLN
40.0000 mg | SUBCUTANEOUS | Status: DC
  Administered 2020-05-18 – 2020-05-22 (×5): 40 mg via SUBCUTANEOUS
  Filled 2020-05-18 (×5): qty 0.4

## 2020-05-18 MED ORDER — LACTULOSE 10 GM/15ML PO SOLN
30.0000 g | Freq: Three times a day (TID) | ORAL | Status: DC
  Administered 2020-05-18 – 2020-05-19 (×3): 30 g via ORAL
  Filled 2020-05-18 (×3): qty 60

## 2020-05-18 MED ORDER — MIDODRINE HCL 5 MG PO TABS
5.0000 mg | ORAL_TABLET | Freq: Three times a day (TID) | ORAL | Status: DC
  Administered 2020-05-19 (×3): 5 mg via ORAL
  Filled 2020-05-18 (×3): qty 1

## 2020-05-18 NOTE — Progress Notes (Signed)
PROGRESS NOTE  [name] [name]  DOB: [birth year]  PCP: [name], [place] JYN:[id number]  DOA: 05/13/2020  LOS: 5 days   Chief Complaint  Patient presents with  . Abnormal Lab    Brief narrative: [name] [name] is a 60 y.o. female with PMH significant for HTN, chronic alcohol abuse, liver cirrhosis, anxiety/depression. Patient presented to the ED on 3/22  with complaint of bilateral lower extremity edema and abdominal distention, with 20 pounds weight gain in the last few months.   In the ED, patient was noted to have volume overload.  LFTs were elevated. Admitted to hospitalist service. GI consulted.  Subjective: Patient was seen and examined this morning.   Propped up in bed.  Feels better, less drowsy, on 2 L oxygen by nasal cannula.  Edema improving.  Making urine output  no bowel movement in 2 days despite lactulose daily.  Assessment/Plan: AKI -Hypovolemia versus hepatorenal syndrome -Presented with a normal creatinine at baseline.  With initial suspicion of ascites and volume overload, she got 2 doses of IV Lasix in total initially.  Her creatinine started to worsen, urine output decreased.  Lasix was held.  Nephrology consultation obtained.  Tried and IV fluid.  Renal function gradually improved. -Nephrology also started the patient on midodrine octreotide per HRS protocol. -Echocardiogram showed EF of 60 to 65% without regional wall motion abnormalities. -Continue to monitor. Recent Labs    11/06/19 1149 02/26/20 1515 03/07/20 1022 05/12/20 1605 05/13/20 1346 05/13/20 1906 05/14/20 0425 05/15/20 0350 05/16/20 0344 05/17/20 0523 05/18/20 0829  BUN 9 12 7  5* <5*  --  9 16 18 18 16   CREATININE 0.73 0.80 0.89 0.75 0.84 1.10* 1.38* 3.15* 3.70* 2.93* 8.65*   Acute metabolic encephalopathy Hepatic encephalopathy -Patient is drowsy probably secondary to the effect of Ativan as well as high ammonia level. -Ativan stopped.  Probably out of window for alcohol  withdrawal now. -Ammonia level was elevated to 78 on last check.  She is not having a bowel movement with lactulose 30 mg daily.  Last bowel movement was 2 days ago.  I would increase lactulose to 30 mg 3 times daily to target 2-3 bowel movements a day.  Repeat ammonia level tomorrow.  Alcoholic liver cirrhosis Superimposed alcoholic hepatitis -Known history of liver cirrhosis.  Continues to drink alcohol.   -Ultrasound abdomen did not reveal any ascites for paracentesis. -May benefit from beta-blocker and diuretics at discharge -GI consult appreciated.  Chronic alcohol abuse  -Continues to drink. Counseled to quit.   -No withdrawal in the hospital so far.  Probably out of withdrawal window.  Discontinue IV Ativan because of risk of oversedation. -Continue PPI   Macrocytic anemia -Likely related to alcohol itself.  Hemoglobin remains low but stable. Recent Labs    09/19/19 1029 11/06/19 1149 03/07/20 1022 05/12/20 1605 05/13/20 1906 05/14/20 0425 05/15/20 0350 05/16/20 0344 05/17/20 0523 05/18/20 0829  HGB 11.6*   < > 10.4*   < > 9.5* 9.8* 8.5* 8.3* 9.0* 8.6*  MCV 102.0*   < > 103.1*   < > 106.2* 108.3* 110.6* 110.2* 109.3* 110.3*  VITAMINB12 1,020  --  379  --  1,855*  --   --   --   --   --   FOLATE  --   --  3.6*  --  9.0  --   --   --   --   --   FERRITIN 115  --  57.3  --   --   --   --   --   --   --  TIBC  --   --   --   --  252  --   --   --   --   --   IRON  --   --  117  --  82  --   --   --   --   --    < > = values in this interval not displayed.   HLD -statin on hold d/t elevated LFTs  Chronic pain -On Norco 5/325 4 times a day. -Encouraged to minimize the use  Anxiety/depression -Continue Abilify, Prozac  Mobility: Encourage ambulation Code Status:   Code Status: Full Code Nutritional status: Body mass index is 40.47 kg/m.     Diet Order            Diet 2 gram sodium Room service appropriate? Yes; Fluid consistency: Thin; Fluid restriction:  Other (see comments)  Diet effective now                 DVT prophylaxis: enoxaparin (LOVENOX) injection 30 mg Start: 05/15/20 2200   Antimicrobials:  None  Fluid: None Consultants: GI, nephrology Family Communication:  None at bedside  Status is: Inpatient  Remains inpatient appropriate because patient needs further renal recovery and inpatient monitoring  Dispo: The patient is from: Home              Anticipated d/c is to: Home in 2 to 3 days              Patient currently is not medically stable to d/c.   Difficult to place patient No       Infusions:    Scheduled Meds: . ARIPiprazole  30 mg Oral Daily  . aspirin EC  81 mg Oral Daily  . chlorhexidine  15 mL Mouth Rinse BID  . Chlorhexidine Gluconate Cloth  6 each Topical Daily  . enoxaparin (LOVENOX) injection  30 mg Subcutaneous Q24H  . FLUoxetine  20 mg Oral BID  . fluticasone  2 spray Each Nare QHS  . folic acid  1 mg Oral Daily  . furosemide  60 mg Intravenous Q12H  . lactulose  30 g Oral TID  . lamoTRIgine  100 mg Oral QHS  . loratadine  10 mg Oral Daily  . midodrine  10 mg Oral TID WC  . montelukast  10 mg Oral QHS  . multivitamin with minerals  1 tablet Oral Daily  . octreotide  200 mcg Subcutaneous Q8H  . pantoprazole  40 mg Oral Daily  . thiamine  100 mg Oral Daily   Or  . thiamine  100 mg Intravenous Daily  . umeclidinium bromide  1 puff Inhalation Daily    Antimicrobials: Anti-infectives (From admission, onward)   None      PRN meds: albuterol, HYDROcodone-acetaminophen, magic mouthwash w/lidocaine, ondansetron (ZOFRAN) IV   Objective: Vitals:   05/17/20 2025 05/18/20 0739  BP: (!) 150/66   Pulse: 62   Resp:    Temp: 98.5 F (36.9 C)   SpO2: 97% 97%    Intake/Output Summary (Last 24 hours) at 05/18/2020 1109 Last data filed at 05/18/2020 0552 Gross per 24 hour  Intake -  Output 700 ml  Net -700 ml   Filed Weights   05/14/20 0400 05/16/20 0545 05/17/20 0500  Weight: 107.1  kg 108.2 kg 110.3 kg   Weight change:  Body mass index is 40.47 kg/m.   Physical Exam: General exam: Pleasant, middle-aged, not in physical distress Skin: No rashes, lesions or  ulcers. HEENT: Atraumatic, normocephalic, no obvious bleeding Lungs: Clear to auscultation bilaterally CVS: Regular rate and rhythm, no murmur GI/Abd soft, nontender, mildly distended, bowel sound present CNS: Slightly drowsy.  Opens eyes on verbal command and able to have a conversation.  Oriented x3. Psychiatry: Mood appropriate Extremities: Improving bilateral pedal edema  Data Review: I have personally reviewed the laboratory data and studies available.  Recent Labs  Lab 05/12/20 1605 05/13/20 1346 05/14/20 0425 05/15/20 0350 05/16/20 0344 05/17/20 0523 05/18/20 0829  WBC 7.5   < > 7.9 6.8 5.6 5.8 7.4  NEUTROABS 4.9  --   --  3.7 3.1 3.6 5.4  HGB 10.4*   < > 9.8* 8.5* 8.3* 9.0* 8.6*  HCT 30.1*   < > 28.8* 26.1* 24.8* 26.9* 25.7*  MCV 108.2*   < > 108.3* 110.6* 110.2* 109.3* 110.3*  PLT 130.0*   < > 185 149* 141* 155 152   < > = values in this interval not displayed.   Recent Labs  Lab 05/13/20 1906 05/14/20 0425 05/15/20 0350 05/16/20 0344 05/17/20 0523 05/18/20 0829  NA  --  135 133* 136 136 135  K  --  5.4* 4.9 4.4 4.3 4.6  CL  --  101 100 102 104 102  CO2  --  25 26 25 22 23   GLUCOSE  --  93 104* 85 94 155*  BUN  --  9 16 18 18 16   CREATININE 1.10* 1.38* 3.15* 3.70* 2.93* 1.78*  CALCIUM  --  8.5* 8.4* 8.7* 8.8* 9.3  MG 1.6*  --   --  1.7  --   --   PHOS 3.7  --   --  3.7  --   --     F/u labs ordered Unresulted Labs (From admission, onward)          Start     Ordered   05/20/20 0500  Creatinine, serum  (enoxaparin (LOVENOX)    CrCl < 30 ml/min)  Weekly,   R     Comments: while on enoxaparin therapy.    05/13/20 1852   05/19/20 2902  Basic metabolic panel  Daily,   R     Question:  Specimen collection method  Answer:  Lab=Lab collect   05/18/20 0808   05/19/20 0500  CBC  with Differential/Platelet  Daily,   R     Question:  Specimen collection method  Answer:  Lab=Lab collect   05/18/20 0808   05/19/20 0500  Ammonia  Daily,   R     Question:  Specimen collection method  Answer:  Lab=Lab collect   05/18/20 1016   05/13/20 1853  Vitamin B1  Once,   R        05/13/20 1852          Signed, Terrilee Croak, MD [facility] 05/18/2020

## 2020-05-18 NOTE — Evaluation (Signed)
Physical Therapy Evaluation Patient Details Name: [name] [name] MRN: [id number] DOB: [birth year] Today's Date: 05/18/2020   History of Present Illness  60 yo female admitted with AKI, hepatic encephalopathy, Pna. Hx of obestiy, COPD, chronic pain, anxiety, depression, chronic ETOH abuse.  Clinical Impression  On eval, pt required Min assist for mobility. She walked ~20 feet with a RW. Ambulation distance limited by general weakness, fatigue, and dyspnea. O2 88% on RA, dyspnea 3/4. Will plan to follow and progress activity as tolerated. Pt stated she plans to return home at discharge.     Follow Up Recommendations Home health PT;Supervision - Intermittent    Equipment Recommendations  None recommended by PT    Recommendations for Other Services       Precautions / Restrictions Precautions Precautions: Fall Precaution Comments: monitor O2; loose stools Restrictions Weight Bearing Restrictions: No      Mobility  Bed Mobility Overal bed mobility: Needs Assistance Bed Mobility: Sit to Supine       Sit to supine: Min guard;HOB elevated        Transfers Overall transfer level: Needs assistance Equipment used: Rolling walker (2 wheeled) Transfers: Sit to/from Stand Sit to Stand: Min assist         General transfer comment: Assist to power up, steady, control descent.  Ambulation/Gait Ambulation/Gait assistance: Min assist Gait Distance (Feet): 20 Feet Assistive device: Rolling walker (2 wheeled) Gait Pattern/deviations: Step-through pattern;Decreased stride length     General Gait Details: slow, effortful gait. fatigues easily. dyspnea 3/4. O2 88% on RA. unsteady  Stairs            Wheelchair Mobility    Modified Rankin (Stroke Patients Only)       Balance Overall balance assessment: Needs assistance;History of Falls         Standing balance support: Bilateral upper extremity supported Standing balance-Leahy Scale: Poor                                Pertinent Vitals/Pain Pain Assessment: Faces Faces Pain Scale: Hurts little more Pain Location: back Pain Descriptors / Indicators: Discomfort;Guarding;Sore Pain Intervention(s): Limited activity within patient's tolerance;Monitored during session;Repositioned    Home Living Family/patient expects to be discharged to:: Private residence Living Arrangements: Non-relatives/Friends Available Help at Discharge: Available PRN/intermittently   Home Access: Stairs to enter Entrance Stairs-Rails: Right Entrance Stairs-Number of Steps: 5   Home Equipment: Environmental consultant - 2 wheels      Prior Function Level of Independence: Independent with assistive device(s)         Comments: using RW PTA     Hand Dominance        Extremity/Trunk Assessment   Upper Extremity Assessment Upper Extremity Assessment: Defer to OT evaluation    Lower Extremity Assessment Lower Extremity Assessment: Generalized weakness    Cervical / Trunk Assessment Cervical / Trunk Assessment: Normal  Communication   Communication: No difficulties  Cognition Arousal/Alertness: Awake/alert Behavior During Therapy: WFL for tasks assessed/performed Overall Cognitive Status: Within Functional Limits for tasks assessed                                        General Comments      Exercises     Assessment/Plan    PT Assessment Patient needs continued PT services  PT Problem List Decreased strength;Decreased mobility;Decreased activity tolerance;Decreased  balance;Decreased knowledge of use of DME;Pain;Obesity       PT Treatment Interventions DME instruction;Gait training;Therapeutic exercise;Balance training;Functional mobility training;Therapeutic activities;Patient/family education    PT Goals (Current goals can be found in the Care Plan section)  Acute Rehab PT Goals Patient Stated Goal: to get better PT Goal Formulation: With patient Time For Goal Achievement:  06/01/20 Potential to Achieve Goals: Good    Frequency Min 3X/week   Barriers to discharge Decreased caregiver support      Co-evaluation               AM-PAC PT "6 Clicks" Mobility  Outcome Measure Help needed turning from your back to your side while in a flat bed without using bedrails?: A Little Help needed moving from lying on your back to sitting on the side of a flat bed without using bedrails?: A Little Help needed moving to and from a bed to a chair (including a wheelchair)?: A Little Help needed standing up from a chair using your arms (e.g., wheelchair or bedside chair)?: A Little Help needed to walk in hospital room?: A Little Help needed climbing 3-5 steps with a railing? : A Lot 6 Click Score: 17    End of Session Equipment Utilized During Treatment: Gait belt Activity Tolerance: Patient limited by fatigue Patient left: in bed;with call bell/phone within reach;with bed alarm set   PT Visit Diagnosis: Muscle weakness (generalized) (M62.81);Difficulty in walking, not elsewhere classified (R26.2)    Time: 0814-4818 PT Time Calculation (min) (ACUTE ONLY): 13 min   Charges:   PT Evaluation $PT Eval Moderate Complexity: 1 Mod             Doreatha Massed, PT Acute Rehabilitation  Office: [phone] Pager: [phone]

## 2020-05-18 NOTE — Progress Notes (Signed)
CROSS COVER [facility] Subjective: Since I last evaluated the patient, she feels somewhat better today.  She denies having any nausea vomiting.  She has had some suprapubic discomfort and urinary hesitancy.  She has had some loose BM since her lactulose was increased this morning.  Objective: Vital signs in last 24 hours: Temp:  [98.1 F (36.7 C)-98.5 F (36.9 C)] 98.5 F (36.9 C) (03/26 2025) Pulse Rate:  [62-81] 62 (03/26 2025) Resp:  [18-20] 18 (03/26 1426) BP: (118-150)/(66-71) 150/66 (03/26 2025) SpO2:  [93 %-97 %] 97 % (03/26 2025) Weight:  [110.3 kg] 110.3 kg (03/26 0500) Last BM Date: 05/16/20  Intake/Output from previous day: 03/26 0701 - 03/27 0700 In: 240 [P.O.:240] Out: 400 [Urine:400] Intake/Output this shift: No intake/output data recorded.  General appearance: alert, cooperative, appears stated age, icteric, morbidly obese and pale Resp: clear to auscultation bilaterally Cardio: regular rate and rhythm, S1, S2 normal, no murmur, click, rub or gallop GI: soft, non-tender; bowel sounds normal; no masses,  no organomegaly Extremities: edema 2+  Lab Results: Recent Labs    05/15/20 0350 05/16/20 0344 05/17/20 0523  WBC 6.8 5.6 5.8  HGB 8.5* 8.3* 9.0*  HCT 26.1* 24.8* 26.9*  PLT 149* 141* 155   BMET Recent Labs    05/15/20 0350 05/16/20 0344 05/17/20 0523  NA 133* 136 136  K 4.9 4.4 4.3  CL 100 102 104  CO2 26 25 22   GLUCOSE 104* 85 94  BUN 16 18 18   CREATININE 3.15* 3.70* 2.93*  CALCIUM 8.4* 8.7* 8.8*   LFT Recent Labs    05/17/20 0523  PROT 6.6  ALBUMIN 3.4*  AST 138*  ALT 40  ALKPHOS 202*  BILITOT 5.3*   PT/INR Recent Labs    05/17/20 0523  LABPROT 16.8*  INR 1.4*   Studies/Results: No results found.  Medications: I have reviewed the patient's current medications.  Assessment/Plan: 1) Decompensated alcoholic cirrhosis, anasarca with ?alcoholic hepatitis in the face of ongoing alcohol abuse & ??HRS-on midodrine and  octreotide. 2) Acute kidney injury-being closely followed by Dr. Jonnie Finner. 3) Hypertension-all meds are on hold for now. 4) COPD. 5) History of anxiety and depression. 6) Morbid obesity.   LOS: 5 days   Juanita Craver 05/18/2020, 12:39 AM

## 2020-05-18 NOTE — Progress Notes (Signed)
[name] [name] Progress Note  Subjective: creat down to 1.78 today. New SOB and orthopnea. CXR ordered.   Vitals:   05/17/20 0759 05/17/20 1426 05/17/20 2025 05/18/20 0739  BP:  (!) 148/71 (!) 150/66   Pulse:  69 62   Resp:  18    Temp:  98.1 F (36.7 C) 98.5 F (36.9 C)   TempSrc:  Oral Oral   SpO2: 93% 96% 97% 97%  Weight:      Height:        Exam:  alert, nad   +jvd mid neck at 45deg  Chest bilat scattered basilar crackles  Cor reg no RG  Abd soft ntnd no ascites   Ext  2+ pitting pretib/ hip edema, 1-2+ abd wall edema    Neuro is alert, Ox 3 , nf, no asterixis       Home meds:  - abilify 30 qd/ prozac 20 bid/ lamictal 100 hs/ norco prn/ robaxin prn/ ultram tid prn  - lasix 20 bid prn/ lopressor 25 bid/ losartan 50 qd/ asa 81/ lipitor 40 qd  - dexilant 60 qd/ flomax 0.4 qd  - singulair 10 qd/ spiriva respimat 2 puff qd  - prn's/ vitamins/ supplements     Renal US 3/24 - bilat kidneys were not fully visualized d/t overlying bowel gas. R 13.2 cm and L 10.0 cm, no obvious hydro or mass.    UA 3/22 - 6-10 wbc, 11-20 epi, no rbc, protein 30, neg Hb, 50 glu, turbid and amber, +small bili   CXR 3/21 - IMPRESSION: 1. Diffuse interstitial and bronchial thickening which may be due to bronchial inflammation or congestive. 2. Additional patchy bilateral perihilar opacities, more typical of multifocal infection. Recommend correlation for infectious symptoms.   Alb 2.3      ECHO 3/23 >> 1. Left ventricular ejection fraction, by estimation, is 60 to 65%. The  left ventricle has normal function. The left ventricle has no regional  wall motion abnormalities. No LVH. Left ventricular diastolic parameters were  Normal.         BP's here lowest was 108/ 70, highest 144/70   RR 18- 24, HR 70- 88 range  afebrile    Wt 109 admit, 107kg today     UNa < 10, UCr 153  Assessment/ Plan: 1. AKI - creat 0.8 on admit. AKI w/ peak creat  3.7 down to 2.9 yest and 1.7 today.  Suspect HRS +/- relative intravasc hypovolemia d/t lasix. CT w/o signs of obstruction. ECHO normal. Started Rx for HRS w/ octreotide SQ tid and po midodrine10- 20 mg tid. SP 2 days of IV albumin qid. Pt has new SOB/ orthopnea this am, suspect pulm edema. Plan CXR, fluid restriction and will re-start IV lasix 60mg  bid for now.   2. Cirrhosis - seen by GI, decomp etoh cirrhosis 3. ETOH abuse 4. HTN - home meds on hold here (ARB, BB) 5. COPD 6. Anxiety/ depression - per pmd       Rob Schertz 05/18/2020, 10:47 AM   Recent Labs  Lab 05/13/20 1906 05/14/20 0425 05/16/20 0344 05/17/20 0523 05/18/20 0829  K  --    < > 4.4 4.3 4.6  BUN  --    < > 18 18 16   CREATININE 1.10*   < > 3.70* 2.93* 1.78*  CALCIUM  --    < > 8.7* 8.8* 9.3  PHOS 3.7  --  3.7  --   --   HGB 9.5*   < >  8.3* 9.0* 8.6*   < > = values in this interval not displayed.   Inpatient medications: . ARIPiprazole  30 mg Oral Daily  . aspirin EC  81 mg Oral Daily  . chlorhexidine  15 mL Mouth Rinse BID  . Chlorhexidine Gluconate Cloth  6 each Topical Daily  . enoxaparin (LOVENOX) injection  30 mg Subcutaneous Q24H  . FLUoxetine  20 mg Oral BID  . fluticasone  2 spray Each Nare QHS  . folic acid  1 mg Oral Daily  . lactulose  30 g Oral TID  . lamoTRIgine  100 mg Oral QHS  . loratadine  10 mg Oral Daily  . midodrine  10 mg Oral TID WC  . montelukast  10 mg Oral QHS  . multivitamin with minerals  1 tablet Oral Daily  . octreotide  200 mcg Subcutaneous Q8H  . pantoprazole  40 mg Oral Daily  . thiamine  100 mg Oral Daily   Or  . thiamine  100 mg Intravenous Daily  . umeclidinium bromide  1 puff Inhalation Daily    albuterol, HYDROcodone-acetaminophen, magic mouthwash w/lidocaine, ondansetron (ZOFRAN) IV

## 2020-05-19 ENCOUNTER — Inpatient Hospital Stay (HOSPITAL_COMMUNITY): Payer: PPO

## 2020-05-19 DIAGNOSIS — K7031 Alcoholic cirrhosis of liver with ascites: Secondary | ICD-10-CM | POA: Diagnosis not present

## 2020-05-19 DIAGNOSIS — F1020 Alcohol dependence, uncomplicated: Secondary | ICD-10-CM | POA: Diagnosis not present

## 2020-05-19 DIAGNOSIS — N179 Acute kidney failure, unspecified: Secondary | ICD-10-CM | POA: Diagnosis not present

## 2020-05-19 DIAGNOSIS — K7200 Acute and subacute hepatic failure without coma: Secondary | ICD-10-CM | POA: Diagnosis not present

## 2020-05-19 DIAGNOSIS — K7460 Unspecified cirrhosis of liver: Secondary | ICD-10-CM | POA: Diagnosis present

## 2020-05-19 LAB — CBC WITH DIFFERENTIAL/PLATELET
Abs Immature Granulocytes: 0.02 10*3/uL (ref 0.00–0.07)
Basophils Absolute: 0.1 10*3/uL (ref 0.0–0.1)
Basophils Relative: 1 %
Eosinophils Absolute: 0.2 10*3/uL (ref 0.0–0.5)
Eosinophils Relative: 3 %
HCT: 26.9 % — ABNORMAL LOW (ref 36.0–46.0)
Hemoglobin: 8.8 g/dL — ABNORMAL LOW (ref 12.0–15.0)
Immature Granulocytes: 0 %
Lymphocytes Relative: 13 %
Lymphs Abs: 1.1 10*3/uL (ref 0.7–4.0)
MCH: 36.1 pg — ABNORMAL HIGH (ref 26.0–34.0)
MCHC: 32.7 g/dL (ref 30.0–36.0)
MCV: 110.2 fL — ABNORMAL HIGH (ref 80.0–100.0)
Monocytes Absolute: 0.8 10*3/uL (ref 0.1–1.0)
Monocytes Relative: 10 %
Neutro Abs: 6 10*3/uL (ref 1.7–7.7)
Neutrophils Relative %: 73 %
Platelets: 155 10*3/uL (ref 150–400)
RBC: 2.44 MIL/uL — ABNORMAL LOW (ref 3.87–5.11)
RDW: 19.7 % — ABNORMAL HIGH (ref 11.5–15.5)
WBC: 8.1 10*3/uL (ref 4.0–10.5)
nRBC: 0 % (ref 0.0–0.2)

## 2020-05-19 LAB — BASIC METABOLIC PANEL
Anion gap: 10 (ref 5–15)
BUN: 14 mg/dL (ref 6–20)
CO2: 25 mmol/L (ref 22–32)
Calcium: 9.2 mg/dL (ref 8.9–10.3)
Chloride: 105 mmol/L (ref 98–111)
Creatinine, Ser: 1.44 mg/dL — ABNORMAL HIGH (ref 0.44–1.00)
GFR, Estimated: 42 mL/min — ABNORMAL LOW (ref >60)
Glucose, Bld: 133 mg/dL — ABNORMAL HIGH (ref 70–99)
Potassium: 3.7 mmol/L (ref 3.5–5.1)
Sodium: 140 mmol/L (ref 135–145)

## 2020-05-19 LAB — PROCALCITONIN: Procalcitonin: 0.11 ng/mL

## 2020-05-19 LAB — AMMONIA: Ammonia: 62 umol/L — ABNORMAL HIGH (ref 9–35)

## 2020-05-19 MED ORDER — FUROSEMIDE 10 MG/ML IJ SOLN
60.0000 mg | Freq: Two times a day (BID) | INTRAMUSCULAR | Status: AC
  Administered 2020-05-19: 60 mg via INTRAVENOUS
  Filled 2020-05-19: qty 6

## 2020-05-19 MED ORDER — LACTULOSE 10 GM/15ML PO SOLN
30.0000 g | Freq: Two times a day (BID) | ORAL | Status: DC
  Administered 2020-05-20 – 2020-05-22 (×6): 30 g via ORAL
  Filled 2020-05-19 (×6): qty 60

## 2020-05-19 NOTE — Progress Notes (Addendum)
Physical Therapy Treatment Patient Details Name: [name] [name] MRN: [id number] DOB: Jul 05, 1960 Today's Date: 05/19/2020    History of Present Illness 60 yo female admitted with AKI, hepatic encephalopathy, Pna. Hx of obestiy, COPD, chronic pain, anxiety, depression, chronic ETOH abuse.    PT Comments    Progressing. O2 83% on RA.    Follow Up Recommendations  Home health PT;Supervision - Intermittent     Equipment Recommendations  None recommended by PT    Recommendations for Other Services       Precautions / Restrictions Precautions Precautions: Fall Precaution Comments: monitor O2 Restrictions Weight Bearing Restrictions: No    Mobility  Bed Mobility Overal bed mobility: Modified Independent Bed Mobility: Supine to Sit;Sit to Supine     Supine to sit: Modified independent (Device/Increase time);HOB elevated     General bed mobility comments: use of bed rail to assist    Transfers Overall transfer level: Needs assistance Equipment used: Rolling walker (2 wheeled) Transfers: Sit to/from Stand Sit to Stand: Min guard         General transfer comment: cues for safety, hand placement.  Ambulation/Gait Ambulation/Gait assistance: Min guard Gait Distance (Feet): 50 Feet Assistive device: Rolling walker (2 wheeled)       General Gait Details: O2 83% on RA, dyspena 2/4. Less dyspnea today but sats still drop below 88.   Stairs             Wheelchair Mobility    Modified Rankin (Stroke Patients Only)       Balance Overall balance assessment: History of Falls         Standing balance support: Bilateral upper extremity supported Standing balance-Leahy Scale: Poor                              Cognition Arousal/Alertness: Awake/alert Behavior During Therapy: WFL for tasks assessed/performed Overall Cognitive Status: Within Functional Limits for tasks assessed                                         Exercises      General Comments        Pertinent Vitals/Pain Pain Assessment: No/denies pain    Home Living                      Prior Function            PT Goals (current goals can now be found in the care plan section) Progress towards PT goals: Progressing toward goals    Frequency    Min 3X/week      PT Plan Current plan remains appropriate    Co-evaluation              AM-PAC PT "6 Clicks" Mobility   Outcome Measure  Help needed turning from your back to your side while in a flat bed without using bedrails?: None Help needed moving from lying on your back to sitting on the side of a flat bed without using bedrails?: None Help needed moving to and from a bed to a chair (including a wheelchair)?: A Little Help needed standing up from a chair using your arms (e.g., wheelchair or bedside chair)?: A Little Help needed to walk in hospital room?: A Little Help needed climbing 3-5 steps with a railing? : A Little 6 Click Score:  20    End of Session Equipment Utilized During Treatment: Gait belt Activity Tolerance: Patient tolerated treatment well Patient left: in bed;with call bell/phone within reach;with bed alarm set   PT Visit Diagnosis: Muscle weakness (generalized) (M62.81);Difficulty in walking, not elsewhere classified (R26.2)     Time: 9450-3888 PT Time Calculation (min) (ACUTE ONLY): 18 min  Charges:  $Gait Training: 8-22 mins                         Doreatha Massed, PT Acute Rehabilitation  Office: [phone] Pager: [phone]

## 2020-05-19 NOTE — Progress Notes (Signed)
Foley d/c'd this am, pt given Flomax and Lasix, pt has voided several times clear yellow urine, approx 300cc+/-. SRP, RN

## 2020-05-19 NOTE — Plan of Care (Signed)

## 2020-05-19 NOTE — Progress Notes (Signed)
[facility] Progress Note  Subjective: Creatinine improving to 1.4.  Excellent urine output yesterday.  Shortness of breath significantly improved per patient.  Vitals:   05/19/20 0806 05/19/20 0807 05/19/20 1411 05/19/20 1459  BP:   (!) 151/67   Pulse:   (!) 57   Resp:   18   Temp:   98.3 F (36.8 C)   TempSrc:   Oral   SpO2: 94% 94% 97% 98%  Weight:      Height:        Exam: GEN: Obese, sitting in bed, nad ENT: no nasal discharge, mmm EYES: no scleral icterus, eomi CV: normal rate PULM: no iwob, bilateral chest rise ABD: NABS, obese, soft SKIN: no obvious rashes EXT: 1+ pitting edema in the bilateral lower extremities up to the thigh, warm and well perfused     Home meds:  - abilify 30 qd/ prozac 20 bid/ lamictal 100 hs/ norco prn/ robaxin prn/ ultram tid prn  - lasix 20 bid prn/ lopressor 25 bid/ losartan 50 qd/ asa 81/ lipitor 40 qd  - dexilant 60 qd/ flomax 0.4 qd  - singulair 10 qd/ spiriva respimat 2 puff qd  - prn's/ vitamins/ supplements     Renal US 3/24 - bilat kidneys were not fully visualized d/t overlying bowel gas. R 13.2 cm and L 10.0 cm, no obvious hydro or mass.    UA 3/22 - 6-10 wbc, 11-20 epi, no rbc, protein 30, neg Hb, 50 glu, turbid and amber, +small bili   CXR 3/21 - IMPRESSION: 1. Diffuse interstitial and bronchial thickening which may be due to bronchial inflammation or congestive. 2. Additional patchy bilateral perihilar opacities, more typical of multifocal infection. Recommend correlation for infectious symptoms.   Alb 2.3      ECHO 3/23 >> 1. Left ventricular ejection fraction, by estimation, is 60 to 65%. The  left ventricle has normal function. The left ventricle has no regional  wall motion abnormalities. No LVH. Left ventricular diastolic parameters were  Normal.         BP's here lowest was 108/ 70, highest 144/70   RR 18- 24, HR 70- 88 range  afebrile    Wt 109 admit, 107kg today     UNa < 10, UCr 153  Assessment/  Plan: 1. AKI - creat 0.8 on admit creatinine peaked at 3.7 concerning for possible HRS versus ATN.  Creatinine continues to improve.  Stop octreotide today and midodrine decreased to 5 mg 3 times daily.  Likely stop midodrine tomorrow if creatinine continues to improve.   2. Pneumonia: Possible pneumonia on CT scan.  Shortness of breath likely had pulmonary edema component as well now much improved.  Continue IV Lasix for now and transition to oral furosemide in 24 to 48 hours 3. Cirrhosis - seen by GI, decomp etoh cirrhosis.  Overall stable 4. ETOH abuse 5. HTN - home meds on hold here (ARB, BB).  Likely stop midodrine tomorrow 6. COPD 7. Anxiety/ depression - per pmd       Reesa Chew  05/19/2020, 3:04 PM   Recent Labs  Lab 05/13/20 1906 05/14/20 0425 05/16/20 0344 05/17/20 0523 05/18/20 0829 05/19/20 0402  K  --    < > 4.4   < > 4.6 3.7  BUN  --    < > 18   < > 16 14  CREATININE 1.10*   < > 3.70*   < > 1.78* 1.44*  CALCIUM  --    < >  8.7*   < > 9.3 9.2  PHOS 3.7  --  3.7  --   --   --   HGB 9.5*   < > 8.3*   < > 8.6* 8.8*   < > = values in this interval not displayed.   Inpatient medications: . ARIPiprazole  30 mg Oral Daily  . aspirin EC  81 mg Oral Daily  . chlorhexidine  15 mL Mouth Rinse BID  . Chlorhexidine Gluconate Cloth  6 each Topical Daily  . enoxaparin (LOVENOX) injection  40 mg Subcutaneous Q24H  . FLUoxetine  20 mg Oral BID  . fluticasone  2 spray Each Nare QHS  . folic acid  1 mg Oral Daily  . furosemide  60 mg Intravenous Q12H  . [START ON 05/20/2020] lactulose  30 g Oral BID  . lamoTRIgine  100 mg Oral QHS  . loratadine  10 mg Oral Daily  . midodrine  5 mg Oral TID WC  . montelukast  10 mg Oral QHS  . multivitamin with minerals  1 tablet Oral Daily  . pantoprazole  40 mg Oral Daily  . thiamine  100 mg Oral Daily   Or  . thiamine  100 mg Intravenous Daily  . umeclidinium bromide  1 puff Inhalation Daily    albuterol,  HYDROcodone-acetaminophen, magic mouthwash w/lidocaine, ondansetron (ZOFRAN) IV

## 2020-05-19 NOTE — Evaluation (Signed)
Occupational Therapy Evaluation Patient Details Name: [name] [name] MRN: [id number] DOB: 09-15-1960 Today's Date: 05/19/2020    History of Present Illness 60 yo female admitted with AKI, hepatic encephalopathy, Pna. Hx of obestiy, COPD, chronic pain, anxiety, depression, chronic ETOH abuse.   Clinical Impression   Pt typically mod I for ADL and mobility (with RW). [place]. Today she is min guard for bed mobility and transfers with RW, SpO2 dropped down to mid 80's with activity - no DOE today. Pt requires mod A for rear peri care. Pt able to complete hand washing standing at sink prior to transfer to recliner to self-feed breakfast. Pt requires continued skilled OT in the acute setting as well as post-acute at the [facility] level to maximize safety and independence in ADL and functional transfers.     Follow Up Recommendations  Home health OT    Equipment Recommendations  3 in 1 bedside commode    Recommendations for Other Services       Precautions / Restrictions Precautions Precautions: Fall Precaution Comments: monitor O2; loose stools Restrictions Weight Bearing Restrictions: No      Mobility Bed Mobility Overal bed mobility: Needs Assistance Bed Mobility: Sit to Supine       Sit to supine: Min guard;HOB elevated   General bed mobility comments: use of bed rail to assist    Transfers Overall transfer level: Needs assistance Equipment used: Rolling walker (2 wheeled) Transfers: Sit to/from Stand Sit to Stand: Min guard         General transfer comment: min guard for safety to power up, steady, control descent.    Balance Overall balance assessment: Needs assistance;History of Falls Sitting-balance support: No upper extremity supported;Feet supported Sitting balance-Leahy Scale: Fair     Standing balance support: Bilateral upper extremity supported Standing balance-Leahy Scale: Poor                             ADL either  performed or assessed with clinical judgement   ADL Overall ADL's : Needs assistance/impaired Eating/Feeding: Set up;Sitting Eating/Feeding Details (indicate cue type and reason): in recliner Grooming: Min guard;Standing;Wash/dry hands   Upper Body Bathing: Minimal assistance Upper Body Bathing Details (indicate cue type and reason): for back Lower Body Bathing: Min guard;Sitting/lateral leans Lower Body Bathing Details (indicate cue type and reason): can reach down to feet Upper Body Dressing : Set up   Lower Body Dressing: Minimal assistance Lower Body Dressing Details (indicate cue type and reason): able to lean down and adjust socks, will need assist for items like underwear or pants Toilet Transfer: Min guard;Stand-pivot;BSC   Toileting- Clothing Manipulation and Hygiene: Moderate assistance;Sit to/from stand Toileting - Clothing Manipulation Details (indicate cue type and reason): able to maintain standing, requires assit for rear peri care     Functional mobility during ADLs: Min guard;Rolling walker General ADL Comments: decreased activity tolerance, desaturates with activity     Vision Baseline Vision/History: Wears glasses Wears Glasses: Reading only Patient Visual Report: No change from baseline       Perception     Praxis      Pertinent Vitals/Pain Pain Assessment: Faces Faces Pain Scale: Hurts a little bit Pain Location: back Pain Descriptors / Indicators: Discomfort;Guarding;Sore Pain Intervention(s): Limited activity within patient's tolerance;Monitored during session;Repositioned     Hand Dominance     Extremity/Trunk Assessment Upper Extremity Assessment Upper Extremity Assessment: Overall WFL for tasks assessed  Lower Extremity Assessment Lower Extremity Assessment: Generalized weakness   Cervical / Trunk Assessment Cervical / Trunk Assessment: Normal   Communication Communication Communication: No difficulties   Cognition  Arousal/Alertness: Awake/alert Behavior During Therapy: WFL for tasks assessed/performed Overall Cognitive Status: No family/caregiver present to determine baseline cognitive functioning                                 General Comments: Pt very pleasant and cooperative, able to follow all commands.   General Comments  When OT entered the room, Pt's O2 off and SpO2 was 81%, with activity and ON O2 Pt desturated to 84%, with seated rest, Pt returned to 91% after 3 min and PLB.    Exercises     Shoulder Instructions      Home Living Family/patient expects to be discharged to:: Private residence Living Arrangements: Non-relatives/Friends Available Help at Discharge: Available PRN/intermittently Type of Home: House Home Access: Stairs to enter [employer] of Steps: 5 Entrance Stairs-Rails: Right       Bathroom Shower/Tub: Teacher, early years/pre: Standard     Home Equipment: Environmental consultant - 2 wheels          Prior Functioning/Environment Level of Independence: Independent with assistive device(s)        Comments: using RW PTA        OT Problem List: Decreased activity tolerance;Cardiopulmonary status limiting activity;Pain      OT Treatment/Interventions: Self-care/ADL training;Therapeutic exercise;Energy conservation;DME and/or AE instruction;Therapeutic activities;Patient/family education;Balance training    OT Goals(Current goals can be found in the care plan section) Acute Rehab OT Goals Patient Stated Goal: to get better OT Goal Formulation: With patient Time For Goal Achievement: 06/02/20 Potential to Achieve Goals: Good ADL Goals Pt Will Perform Grooming: with modified independence;standing Pt Will Perform Upper Body Dressing: with modified independence;sitting Pt Will Perform Lower Body Dressing: with supervision;sit to/from stand Pt Will Transfer to Toilet: with modified independence;ambulating Pt Will Perform Toileting -  Clothing Manipulation and hygiene: with modified independence;sit to/from stand Additional ADL Goal #1: Pt will self-montor SpO2 during ADL activities, using compensatory breathing strategies and supplemental O2 to maintain SpO2 >90%.  OT Frequency: Min 2X/week   Barriers to D/C:            Co-evaluation              AM-PAC OT "6 Clicks" Daily Activity     Outcome Measure Help from another person eating meals?: None Help from another person taking care of personal grooming?: A Little Help from another person toileting, which includes using toliet, bedpan, or urinal?: A Lot Help from another person bathing (including washing, rinsing, drying)?: A Lot Help from another person to put on and taking off regular upper body clothing?: A Little Help from another person to put on and taking off regular lower body clothing?: A Little 6 Click Score: 17   End of Session Equipment Utilized During Treatment: Rolling walker;Gait belt;Oxygen (2L) Nurse Communication: Mobility status;Other (comment) (asking for coffee)  Activity Tolerance: Patient tolerated treatment well Patient left: in chair;with call bell/phone within reach;with chair alarm set  OT Visit Diagnosis: Other abnormalities of gait and mobility (R26.89);Muscle weakness (generalized) (M62.81);Other (comment) (cariopulmonary status)                Time: 8563-1497 OT Time Calculation (min): 19 min Charges:  OT General Charges $OT Visit: 1 Visit OT Evaluation $OT Eval  Moderate Complexity: [place] OTR/L Acute Rehabilitation Services Pager: [phone] Office: [place] 05/19/2020, 10:48 AM

## 2020-05-19 NOTE — Progress Notes (Signed)
PROGRESS NOTE  [name] [name]  DOB: [birth year]  PCP: [name], [place] XLK:[id number]  DOA: 05/13/2020  LOS: 6 days   Chief Complaint  Patient presents with  . Abnormal Lab    Brief narrative: [name] [name] is a 60 y.o. female with PMH significant for HTN, chronic alcohol abuse, liver cirrhosis, anxiety/depression. Patient presented to the ED on 3/22  with complaint of bilateral lower extremity edema and abdominal distention, with 20 pounds weight gain in the last few months.   In the ED, patient was noted to have volume overload.  LFTs were elevated. Admitted to hospitalist service. GI consulted.  Subjective: Patient was seen and examined this morning.   Sitting up in chair.  Not in distress.  On 2 L oxygen by nasal cannula. Had 2-3 bowel movements last night after lactulose dose was increased yesterday.  Assessment/Plan: AKI -Hypovolemia versus hepatorenal syndrome -Presented with a normal creatinine at baseline.  With initial suspicion of ascites and volume overload, she got 2 doses of IV Lasix.  Her creatinine started to worsen, urine output decreased.  Hence Lasix was held.  Nephrology started the patient on midodrine octreotide per HRS protocol.  Creatinine gradually improved.  She is volume overloaded and is now back on diuresis with Lasix currently Lasix 60 mg twice daily. -Net IO Since Admission: -1,806.99 mL [05/19/20 1340]  -Echocardiogram showed EF of 60 to 65% without regional wall motion abnormalities. -Continue to monitor for daily intake output, weight, blood pressure, BNP, renal function and electrolytes. Recent Labs  Lab 05/12/20 1605 05/13/20 1346 05/13/20 1906 05/14/20 0425 05/15/20 0350 05/16/20 0344 05/17/20 0523 05/18/20 0829 05/19/20 0402  PROBNP 228.0*  --   --   --   --   --   --   --   --   BUN 5*   < >  --    < > 16 18 18 16 14   CREATININE 0.75   < > 1.10*   < > 3.15* 3.70* 2.93* 1.78* 1.44*  K 4.7   < >  --    < > 4.9 4.4 4.3 4.6  3.7  MG  --   --  1.6*  --   --  1.7  --   --   --    < > = values in this interval not displayed.   Acute respiratory failure with hypoxia -Patient has been requiring 2 to 3 L of oxygen by nasal cannula.  She is overall volume overloaded. -3/27, chest x-ray showed patchy bilateral pulmonary infiltrates and also questionable cavitary nodules in left upper chest. -3/28, CT chest showed patchy ground-glass densities and small nodular consolidations throughout both lungs, consistent with multifocal pneumonia.  Distending cavitation -Clinically I think  the multifocal infiltrates seen in chest imaging are due to pulmonary edema and not pneumonia.  Patient is afebrile, WBC count is normal, procalcitonin level is barely elevated.  -I would avoid antibiotics at this time and continue IV diuresis. -Continue to monitor. Recent Labs  Lab 05/15/20 0350 05/16/20 0344 05/17/20 0523 05/18/20 0829 05/19/20 0402  WBC 6.8 5.6 5.8 7.4 8.1  PROCALCITON  --   --   --   --  3.66   Acute metabolic encephalopathy Hepatic encephalopathy -Patient is drowsy probably secondary to the effect of Ativan given per CIWA protocol as well as high ammonia level. -Out of withdrawal window now.  Ativan has been stopped.   -Patient is now having 2-3 bowel movements a day with scheduled  lactulose.  Ammonia level gradually improving.  Mental status improving.  Continue to monitor. Recent Labs  Lab 05/15/20 1041 05/19/20 0402  AMMONIA 78* 62*   Alcoholic liver cirrhosis Superimposed alcoholic hepatitis Chronic alcohol abuse -Known history of liver cirrhosis.  Continues to drink alcohol.   -Ultrasound abdomen did not reveal any ascites for paracentesis. -Daily hypervolemic and getting diuresis. -May benefit from beta-blocker and diuretics at discharge -GI consult appreciated. -No withdrawal in the hospital so far.  Probably out of withdrawal window.   -Continue PPI -Counseled to quit alcohol   Macrocytic  anemia -Likely related to alcohol itself.  Hemoglobin remains low but stable. Recent Labs    09/19/19 1029 11/06/19 1149 03/07/20 1022 05/12/20 1605 05/13/20 1906 05/14/20 0425 05/15/20 0350 05/16/20 0344 05/17/20 0523 05/18/20 0829 05/19/20 0402  HGB 11.6*   < > 10.4*   < > 9.5*   < > 8.5* 8.3* 9.0* 8.6* 8.8*  MCV 102.0*   < > 103.1*   < > 106.2*   < > 110.6* 110.2* 109.3* 110.3* 110.2*  VITAMINB12 1,020  --  379  --  1,855*  --   --   --   --   --   --   FOLATE  --   --  3.6*  --  9.0  --   --   --   --   --   --   FERRITIN 115  --  57.3  --   --   --   --   --   --   --   --   TIBC  --   --   --   --  252  --   --   --   --   --   --   IRON  --   --  117  --  82  --   --   --   --   --   --    < > = values in this interval not displayed.   HLD -statin on hold d/t elevated LFTs  Chronic pain -On Norco 5/325 4 times a day. -Encouraged to minimize the use  Anxiety/depression -Continue Abilify, Prozac  Mobility: Encourage ambulation Code Status:   Code Status: Full Code Nutritional status: Body mass index is 39.25 kg/m.     Diet Order            Diet 2 gram sodium Room service appropriate? Yes; Fluid consistency: Thin; Fluid restriction: Other (see comments)  Diet effective now                 DVT prophylaxis: enoxaparin (LOVENOX) injection 40 mg Start: 05/18/20 2200   Antimicrobials:  None  Fluid: None Consultants: GI, nephrology Family Communication:  None at bedside  Status is: Inpatient  Remains inpatient appropriate because patient needs further renal recovery and inpatient monitoring  Dispo: The patient is from: Home              Anticipated d/c is to: Home in 2 to 3 days              Patient currently is not medically stable to d/c.   Difficult to place patient No  Infusions:    Scheduled Meds: . ARIPiprazole  30 mg Oral Daily  . aspirin EC  81 mg Oral Daily  . chlorhexidine  15 mL Mouth Rinse BID  . Chlorhexidine Gluconate Cloth  6 each  Topical Daily  . enoxaparin (LOVENOX) injection  40 mg Subcutaneous Q24H  . FLUoxetine  20 mg Oral BID  . fluticasone  2 spray Each Nare QHS  . folic acid  1 mg Oral Daily  . furosemide  60 mg Intravenous Q12H  . lactulose  30 g Oral TID  . lamoTRIgine  100 mg Oral QHS  . loratadine  10 mg Oral Daily  . midodrine  5 mg Oral TID WC  . montelukast  10 mg Oral QHS  . multivitamin with minerals  1 tablet Oral Daily  . pantoprazole  40 mg Oral Daily  . thiamine  100 mg Oral Daily   Or  . thiamine  100 mg Intravenous Daily  . umeclidinium bromide  1 puff Inhalation Daily    Antimicrobials: Anti-infectives (From admission, onward)   None      PRN meds: albuterol, HYDROcodone-acetaminophen, magic mouthwash w/lidocaine, ondansetron (ZOFRAN) IV   Objective: Vitals:   05/19/20 0806 05/19/20 0807  BP:    Pulse:    Resp:    Temp:    SpO2: 94% 94%    Intake/Output Summary (Last 24 hours) at 05/19/2020 1340 Last data filed at 05/19/2020 1300 Gross per 24 hour  Intake 240 ml  Output 4350 ml  Net -4110 ml   Filed Weights   05/17/20 0500 05/18/20 0500 05/19/20 0444  Weight: 110.3 kg 110.3 kg 107 kg   Weight change: -3.3 kg Body mass index is 39.25 kg/m.   Physical Exam: General exam: Pleasant, middle-aged, not in physical distress Skin: No rashes, lesions or ulcers. HEENT: Atraumatic, normocephalic, no obvious bleeding Lungs: Clear to auscultation bilaterally CVS: Regular rate and rhythm, no murmur GI/Abd soft, nontender, mildly distended, bowel sound present CNS: Alert, awake, oriented x3  psychiatry: Mood appropriate Extremities: Improving bilateral pedal edema but still has more than 1+, no calf tenderness  Data Review: I have personally reviewed the laboratory data and studies available.  Recent Labs  Lab 05/15/20 0350 05/16/20 0344 05/17/20 0523 05/18/20 0829 05/19/20 0402  WBC 6.8 5.6 5.8 7.4 8.1  NEUTROABS 3.7 3.1 3.6 5.4 6.0  HGB 8.5* 8.3* 9.0* 8.6* 8.8*   HCT 26.1* 24.8* 26.9* 25.7* 26.9*  MCV 110.6* 110.2* 109.3* 110.3* 110.2*  PLT 149* 141* 155 152 155   Recent Labs  Lab 05/13/20 1906 05/14/20 0425 05/15/20 0350 05/16/20 0344 05/17/20 0523 05/18/20 0829 05/19/20 0402  NA  --    < > 133* 136 136 135 140  K  --    < > 4.9 4.4 4.3 4.6 3.7  CL  --    < > 100 102 104 102 105  CO2  --    < > 26 25 22 23 25   GLUCOSE  --    < > 104* 85 94 155* 133*  BUN  --    < > 16 18 18 16 14   CREATININE 1.10*   < > 3.15* 3.70* 2.93* 1.78* 1.44*  CALCIUM  --    < > 8.4* 8.7* 8.8* 9.3 9.2  MG 1.6*  --   --  1.7  --   --   --   PHOS 3.7  --   --  3.7  --   --   --    < > = values in this interval not displayed.    F/u labs ordered Unresulted Labs (From admission, onward)          Start     Ordered   05/20/20 0500  Creatinine, serum  (enoxaparin (LOVENOX)  CrCl < 30 ml/min)  Weekly,   R     Comments: while on enoxaparin therapy.    05/13/20 1852   05/20/20 0500  Procalcitonin  Daily,   R     Question:  Specimen collection method  Answer:  Lab=Lab collect   05/19/20 0752   05/19/20 2111  Basic metabolic panel  Daily,   R     Question:  Specimen collection method  Answer:  Lab=Lab collect   05/18/20 0808   05/19/20 0500  CBC with Differential/Platelet  Daily,   R     Question:  Specimen collection method  Answer:  Lab=Lab collect   05/18/20 0808   05/19/20 0500  Ammonia  Daily,   R     Question:  Specimen collection method  Answer:  Lab=Lab collect   05/18/20 1016   05/13/20 1853  Vitamin B1  Once,   R        05/13/20 1852          Signed, Terrilee Croak, MD [facility] 05/19/2020

## 2020-05-19 NOTE — Care Management Important Message (Signed)
Important Message  Patient Details IM Letter given to the Patient. Name: [name] [name] MRN: [id number] Date of Birth: [birth year]   Medicare Important Message Given:  Yes     Kerin Salen 05/19/2020, 11:43 AM

## 2020-05-19 NOTE — Progress Notes (Signed)
Pt had 2 BMs post lactolose. SRP,RN

## 2020-05-19 NOTE — Progress Notes (Addendum)
Attending physician's note   I have taken an interval history, reviewed the chart and examined the patient. I agree with the Advanced Practitioner's note, impression, and recommendations as outlined.   She reports that she is feeling better today.  Improved renal function, with creatinine 1.4.  1) Cirrhosis 2) EtOH use disorder -Decompensated EtOH cirrhosis, possibly with superimposed alcoholic hepatitis on admission. -Discussed cirrhosis and EtOH use disorder/abuse at length with the patient today to include need for complete abstinence of all EtOH -EGD in 01/2020 with PHG but no esophageal varices.  Repeat not needed at this juncture from a screening/surveillance standpoint -Will need repeat MRI liver in 3 months for evaluation of LiRADS 3 lesion on inpatient MRI.  AFP was otherwise normal/negative -Resume lactulose, MVI, thiamine -Continue supportive care  3) AKI -Possible HRS vs ATN per consulting Nephrology service.  Octreotide was stopped today and midodrine decreased with possibility for stopping oral per Nephrology service.     [place], DO, FACG [phone] office             Progress Note   Subjective  Chief Complaint: Decompensated alcoholic cirrhosis with HRS  This morning patient is sitting up in the chair by her bed and very thankful that she could do this by herself, apparently the first time she has really been able to move around since being in the hospital.  She is doing well with no real complaints, continues with 3-4 loose stools per day.  Has some questions about her cirrhosis.   Objective   Vital signs in last 24 hours: Temp:  [98.2 F (36.8 C)-98.6 F (37 C)] 98.6 F (37 C) (03/28 0506) Pulse Rate:  [62-64] 63 (03/28 0506) Resp:  [18-22] 22 (03/28 0506) BP: (131-149)/(65-80) 131/67 (03/28 0506) SpO2:  [93 %-95 %] 94 % (03/28 0807) Weight:  [366 kg] 107 kg (03/28 0444) Last BM Date: 05/16/20 General:    white female in NAD Heart:   Regular rate and rhythm; no murmurs Lungs: Respirations even and unlabored, lungs CTA bilaterally +O2 via [place] Abdomen:  Soft, nontender and nondistended. Normal bowel sounds. Psych:  Cooperative. Normal mood and affect.  Intake/Output from previous day: 03/27 0701 - 03/28 0700 In: 360 [P.O.:360] Out: 3700 [Urine:3700]  Lab Results: Recent Labs    05/17/20 0523 05/18/20 0829 05/19/20 0402  WBC 5.8 7.4 8.1  HGB 9.0* 8.6* 8.8*  HCT 26.9* 25.7* 26.9*  PLT 155 152 155   BMET Recent Labs    05/17/20 0523 05/18/20 0829 05/19/20 0402  NA 136 135 140  K 4.3 4.6 3.7  CL 104 102 105  CO2 22 23 25   GLUCOSE 94 155* 133*  BUN 18 16 14   CREATININE 2.93* 1.78* 1.44*  CALCIUM 8.8* 9.3 9.2   LFT Recent Labs    05/17/20 0523  PROT 6.6  ALBUMIN 3.4*  AST 138*  ALT 40  ALKPHOS 202*  BILITOT 5.3*   PT/INR Recent Labs    05/17/20 0523  LABPROT 16.8*  INR 1.4*    Studies/Results: DG CHEST PORT 1 VIEW  Result Date: 05/18/2020 CLINICAL DATA:  Acute kidney injury, cirrhosis, volume overload, shortness of breath EXAM: PORTABLE CHEST 1 VIEW COMPARISON:  Portable exam 1042 hours compared to 05/12/2020 FINDINGS: Borderline enlargement of cardiac silhouette. Mediastinal contours normal. Patchy BILATERAL pulmonary infiltrates consistent with multifocal pneumonia. Infiltrates are asymmetrically greater in LEFT upper lobe. Question cavitary lesion LEFT upper lobe 2.9 x 2.6 cm. No pleural effusion or pneumothorax. Osseous structures unremarkable. IMPRESSION:  Patchy BILATERAL pulmonary infiltrates consistent with multifocal pneumonia. Question cavitary nodular focus in the LEFT upper lobe 2.9 x 2.6 cm diameter; CT chest recommended to exclude cavitary pneumonia. Electronically Signed   By: Lavonia Dana M.D.   On: 05/18/2020 12:45    Assessment / Plan:   Assessment: 1.  Decompensated alcoholic cirrhosis: Anasarca with question of alcoholic hepatitis in the face of ongoing alcohol abuse, also  question of HRS-nephrology following on midodrine and octreotide 2.  COPD 3.  Morbid obesity  Plan: 1.  Continue supportive measures 2.  Please await further recommendations from Dr. Lyndel Safe later today  Thank you for kind consultation, we will continue to follow.   LOS: 6 days   Levin Erp  05/19/2020, 11:06 AM

## 2020-05-20 DIAGNOSIS — R601 Generalized edema: Secondary | ICD-10-CM | POA: Diagnosis not present

## 2020-05-20 DIAGNOSIS — N179 Acute kidney failure, unspecified: Secondary | ICD-10-CM | POA: Diagnosis not present

## 2020-05-20 DIAGNOSIS — F1020 Alcohol dependence, uncomplicated: Secondary | ICD-10-CM | POA: Diagnosis not present

## 2020-05-20 DIAGNOSIS — K7031 Alcoholic cirrhosis of liver with ascites: Secondary | ICD-10-CM | POA: Diagnosis not present

## 2020-05-20 LAB — CBC WITH DIFFERENTIAL/PLATELET
Abs Immature Granulocytes: 0.03 10*3/uL (ref 0.00–0.07)
Basophils Absolute: 0.1 10*3/uL (ref 0.0–0.1)
Basophils Relative: 1 %
Eosinophils Absolute: 0.4 10*3/uL (ref 0.0–0.5)
Eosinophils Relative: 5 %
HCT: 25.8 % — ABNORMAL LOW (ref 36.0–46.0)
Hemoglobin: 8.7 g/dL — ABNORMAL LOW (ref 12.0–15.0)
Immature Granulocytes: 0 %
Lymphocytes Relative: 22 %
Lymphs Abs: 1.6 10*3/uL (ref 0.7–4.0)
MCH: 36.7 pg — ABNORMAL HIGH (ref 26.0–34.0)
MCHC: 33.7 g/dL (ref 30.0–36.0)
MCV: 108.9 fL — ABNORMAL HIGH (ref 80.0–100.0)
Monocytes Absolute: 0.8 10*3/uL (ref 0.1–1.0)
Monocytes Relative: 10 %
Neutro Abs: 4.6 10*3/uL (ref 1.7–7.7)
Neutrophils Relative %: 62 %
Platelets: 163 10*3/uL (ref 150–400)
RBC: 2.37 MIL/uL — ABNORMAL LOW (ref 3.87–5.11)
RDW: 19.5 % — ABNORMAL HIGH (ref 11.5–15.5)
WBC: 7.4 10*3/uL (ref 4.0–10.5)
nRBC: 0 % (ref 0.0–0.2)

## 2020-05-20 LAB — PROCALCITONIN: Procalcitonin: 0.12 ng/mL

## 2020-05-20 LAB — BASIC METABOLIC PANEL
Anion gap: 11 (ref 5–15)
BUN: 12 mg/dL (ref 6–20)
CO2: 31 mmol/L (ref 22–32)
Calcium: 9.5 mg/dL (ref 8.9–10.3)
Chloride: 102 mmol/L (ref 98–111)
Creatinine, Ser: 1.34 mg/dL — ABNORMAL HIGH (ref 0.44–1.00)
GFR, Estimated: 46 mL/min — ABNORMAL LOW (ref >60)
Glucose, Bld: 128 mg/dL — ABNORMAL HIGH (ref 70–99)
Potassium: 3.2 mmol/L — ABNORMAL LOW (ref 3.5–5.1)
Sodium: 144 mmol/L (ref 135–145)

## 2020-05-20 LAB — AMMONIA: Ammonia: 50 umol/L — ABNORMAL HIGH (ref 9–35)

## 2020-05-20 MED ORDER — AMOXICILLIN-POT CLAVULANATE 875-125 MG PO TABS
1.0000 | ORAL_TABLET | Freq: Two times a day (BID) | ORAL | Status: DC
  Administered 2020-05-20 – 2020-05-23 (×7): 1 via ORAL
  Filled 2020-05-20 (×7): qty 1

## 2020-05-20 MED ORDER — FUROSEMIDE 40 MG PO TABS
40.0000 mg | ORAL_TABLET | Freq: Two times a day (BID) | ORAL | Status: DC
  Administered 2020-05-20 – 2020-05-23 (×6): 40 mg via ORAL
  Filled 2020-05-20 (×6): qty 1

## 2020-05-20 NOTE — Progress Notes (Signed)
PROGRESS NOTE  [name] [name]  DOB: Mar 10, 1960  PCP: [name], [place] FYB:[id number]  DOA: 05/13/2020  LOS: 7 days   Chief Complaint  Patient presents with  . Abnormal Lab    Brief narrative: [name] [name] is a 60 y.o. female with PMH significant for HTN, chronic alcohol abuse, liver cirrhosis, anxiety/depression. Patient presented to the ED on 3/22  with complaint of bilateral lower extremity edema and abdominal distention, with 20 pounds weight gain in the last few months.   In the ED, patient was noted to have volume overload.  LFTs were elevated. Admitted to hospitalist service. GI consulted.  Subjective: Patient was seen and examined this morning.   Lying on bed.  Not in distress.  Says that she felt chest tightness earlier.  She is making rusty sputum.  On 2 L oxygen at rest.  Required 3 L on ambulation to maintain saturation more than 90%.   Getting diuresed.  Assessment/Plan: AKI hepatorenal syndrome -Presented with a normal creatinine at baseline.  With initial suspicion of ascites and volume overload, she got 2 doses of IV Lasix.  Her creatinine started to worsen, urine output decreased.  Hence Lasix was held.  Nephrology started the patient on midodrine octreotide per HRS protocol.  Creatinine gradually improved.  She is volume overloaded and is now back on diuresis with Lasix.  Nephrology switched from IV Lasix to oral Lasix today.  -Echocardiogram 3/24 with EF of 60 to 65% without regional wall motion abnormalities. -Continue to monitor for daily intake output, weight, blood pressure, BNP, renal function and electrolytes.  Acute respiratory failure with hypoxia Multifocal pneumonia -Patient has been requiring 2 to 3 L of oxygen by nasal cannula.  She is overall volume overloaded. -3/27, chest x-ray showed patchy bilateral pulmonary infiltrates and also questionable cavitary nodules in left upper chest. -3/28, CT chest showed patchy ground-glass densities  and small nodular consolidations throughout both lungs, consistent with multifocal pneumonia.  Distending cavitation -Clinically her respiratory distress is likely more due to volume overload related to liver cirrhosis than pneumonia.  Because of absence of fever and leukocytosis, I was avoiding antibiotic.  However complains of chest tightness and has rusty sputum.  I would start on oral Augmentin today.  -Continue to monitor. Recent Labs  Lab 05/16/20 0344 05/17/20 0523 05/18/20 0829 05/19/20 0402 05/20/20 0425  WBC 5.6 5.8 7.4 8.1 7.4  PROCALCITON  --   --   --  0.11 5.27   Acute metabolic encephalopathy Hepatic encephalopathy -Patient is drowsy probably secondary to the effect of Ativan given per CIWA protocol as well as high ammonia level. -Out of withdrawal window now.  Ativan has been stopped.   -Patient is now having 2-3 bowel movements a day with scheduled lactulose.  Ammonia level gradually improving.  Mental status improving.  Continue to monitor. Recent Labs  Lab 05/15/20 1041 05/19/20 0402 05/20/20 0425  AMMONIA 78* 16* 50*   Alcoholic liver cirrhosis Superimposed alcoholic hepatitis Chronic alcohol abuse -Known history of liver cirrhosis.  Continues to drink alcohol.   -Ultrasound abdomen did not reveal any ascites for paracentesis. -On oral Lasix 40 mg twice daily -GI consult appreciated. -No withdrawal in the hospital so far.  Probably out of withdrawal window.   -Continue PPI -Counseled to quit alcohol   Macrocytic anemia -Likely related to alcohol itself.  Hemoglobin remains low but stable. Recent Labs    09/19/19 1029 11/06/19 1149 03/07/20 1022 05/12/20 1605 05/13/20 1906 05/14/20 0425 05/16/20 0344  05/17/20 0523 05/18/20 0829 05/19/20 0402 05/20/20 0425  HGB 11.6*   < > 10.4*   < > 9.5*   < > 8.3* 9.0* 8.6* 8.8* 8.7*  MCV 102.0*   < > 103.1*   < > 106.2*   < > 110.2* 109.3* 110.3* 110.2* 108.9*  VITAMINB12 1,020  --  379  --  1,855*  --   --    --   --   --   --   FOLATE  --   --  3.6*  --  9.0  --   --   --   --   --   --   FERRITIN 115  --  57.3  --   --   --   --   --   --   --   --   TIBC  --   --   --   --  252  --   --   --   --   --   --   IRON  --   --  117  --  82  --   --   --   --   --   --    < > = values in this interval not displayed.   HLD -statin on hold d/t elevated LFTs  Chronic pain -On Norco 5/325 4 times a day. -Encouraged to minimize the use  Anxiety/depression -Continue Abilify, Prozac  Mobility: Encourage ambulation Code Status:   Code Status: Full Code Nutritional status: Body mass index is 40.06 kg/m.     Diet Order            Diet 2 gram sodium Room service appropriate? Yes; Fluid consistency: Thin; Fluid restriction: Other (see comments)  Diet effective now                 DVT prophylaxis: enoxaparin (LOVENOX) injection 40 mg Start: 05/18/20 2200   Antimicrobials:  None  Fluid: None Consultants: GI, nephrology Family Communication:  None at bedside  Status is: Inpatient  Remains inpatient appropriate because patient continues to have chest symptoms, has gross edema and oxygen dependence  dispo: The patient is from: Home              Anticipated d/c is to: Home in 1 to 2 days              Patient currently is not medically stable to d/c.   Difficult to place patient No  Infusions:    Scheduled Meds: . amoxicillin-clavulanate  1 tablet Oral Q12H  . ARIPiprazole  30 mg Oral Daily  . aspirin EC  81 mg Oral Daily  . chlorhexidine  15 mL Mouth Rinse BID  . Chlorhexidine Gluconate Cloth  6 each Topical Daily  . enoxaparin (LOVENOX) injection  40 mg Subcutaneous Q24H  . FLUoxetine  20 mg Oral BID  . fluticasone  2 spray Each Nare QHS  . folic acid  1 mg Oral Daily  . furosemide  40 mg Oral BID  . lactulose  30 g Oral BID  . lamoTRIgine  100 mg Oral QHS  . loratadine  10 mg Oral Daily  . montelukast  10 mg Oral QHS  . multivitamin with minerals  1 tablet Oral Daily  .  pantoprazole  40 mg Oral Daily  . thiamine  100 mg Oral Daily   Or  . thiamine  100 mg Intravenous Daily  . umeclidinium bromide  1 puff Inhalation Daily  Antimicrobials: Anti-infectives (From admission, onward)   Start     Dose/Rate Route Frequency Ordered Stop   05/20/20 1415  amoxicillin-clavulanate (AUGMENTIN) 875-125 MG per tablet 1 tablet        1 tablet Oral Every 12 hours 05/20/20 1326        PRN meds: albuterol, HYDROcodone-acetaminophen, magic mouthwash w/lidocaine, ondansetron (ZOFRAN) IV   Objective: Vitals:   05/20/20 0804 05/20/20 0806  BP:    Pulse:    Resp:    Temp:    SpO2: 97% 97%    Intake/Output Summary (Last 24 hours) at 05/20/2020 1347 Last data filed at 05/20/2020 0503 Gross per 24 hour  Intake 480 ml  Output --  Net 480 ml   Filed Weights   05/18/20 0500 05/19/20 0444 05/20/20 0500  Weight: 110.3 kg 107 kg 109.2 kg   Weight change: 2.2 kg Body mass index is 40.06 kg/m.   Physical Exam: General exam: Pleasant, middle-aged, not in physical distress Skin: No rashes, lesions or ulcers. HEENT: Atraumatic, normocephalic, no obvious bleeding Lungs: Clear to auscultation bilaterally CVS: Regular rate and rhythm, no murmur GI/Abd soft, nontender, mildly distended, bowel sound present CNS: Alert, awake, oriented x3  psychiatry: Mood appropriate Extremities: Improving but still has trace to 1+ bilateral pedal edema  Data Review: I have personally reviewed the laboratory data and studies available.  Recent Labs  Lab 05/16/20 0344 05/17/20 0523 05/18/20 0829 05/19/20 0402 05/20/20 0425  WBC 5.6 5.8 7.4 8.1 7.4  NEUTROABS 3.1 3.6 5.4 6.0 4.6  HGB 8.3* 9.0* 8.6* 8.8* 8.7*  HCT 24.8* 26.9* 25.7* 26.9* 25.8*  MCV 110.2* 109.3* 110.3* 110.2* 108.9*  PLT 141* 155 152 155 163   Recent Labs  Lab 05/13/20 1906 05/14/20 0425 05/16/20 0344 05/17/20 0523 05/18/20 0829 05/19/20 0402 05/20/20 0425  NA  --    < > 136 136 135 140 144  K  --     < > 4.4 4.3 4.6 3.7 3.2*  CL  --    < > 102 104 102 105 102  CO2  --    < > 25 22 23 25 31   GLUCOSE  --    < > 85 94 155* 133* 128*  BUN  --    < > 18 18 16 14 12   CREATININE 1.10*   < > 3.70* 2.93* 1.78* 1.44* 1.34*  CALCIUM  --    < > 8.7* 8.8* 9.3 9.2 9.5  MG 1.6*  --  1.7  --   --   --   --   PHOS 3.7  --  3.7  --   --   --   --    < > = values in this interval not displayed.    F/u labs ordered Unresulted Labs (From admission, onward)          Start     Ordered   05/20/20 0500  Creatinine, serum  (enoxaparin (LOVENOX)    CrCl < 30 ml/min)  Weekly,   R     Comments: while on enoxaparin therapy.    05/13/20 1852   05/20/20 0500  Procalcitonin  Daily,   R     Question:  Specimen collection method  Answer:  Lab=Lab collect   05/19/20 0752   05/19/20 7893  Basic metabolic panel  Daily,   R     Question:  Specimen collection method  Answer:  Lab=Lab collect   05/18/20 0808   05/19/20 0500  CBC with Differential/Platelet  Daily,   R     Question:  Specimen collection method  Answer:  Lab=Lab collect   05/18/20 0808   05/19/20 0500  Ammonia  Daily,   R     Question:  Specimen collection method  Answer:  Lab=Lab collect   05/18/20 1016   05/13/20 1853  Vitamin B1  Once,   R        05/13/20 1852          Signed, Terrilee Croak, MD [facility] 05/20/2020

## 2020-05-20 NOTE — Progress Notes (Addendum)
Attending physician's note   I have taken an interval history, reviewed the chart and examined the patient. I agree with the Advanced Practitioner's note, impression, and recommendations as outlined.  Started on Augmentin today for multifocal pneumonia on CT chest.  Otherwise, renal function continues to improve and now off midodrine/octreotide.  [name] Teti, DO, FACG [phone] office             [facility] Progress Note  CC:  ETOH cirrhosis  Subjective:  Was feeling great but then this morning she blew her nose and had some blood then coughed up a little bit of blood.  Reports pain with deep breaths and epigastric pain/pain under both sides of ribs.  Objective:  Vital signs in last 24 hours: Temp:  [98.3 F (36.8 C)-99.5 F (37.5 C)] 99.1 F (37.3 C) (03/29 0508) Pulse Rate:  [57-70] 70 (03/29 0508) Resp:  [18-20] 20 (03/29 0508) BP: (121-151)/(61-77) 151/77 (03/29 0508) SpO2:  [94 %-98 %] 97 % (03/29 0806) Weight:  [109.2 kg] 109.2 kg (03/29 0500) Last BM Date: 05/19/20 General:  Alert, Well-developed, in NAD Heart:  Regular rate and rhythm; no murmurs Pulm:  CTAB anteriorly. Abdomen:  Soft, non-distended.  BS present.  Epigastric TTP.  Extremities:  Without edema. Neurologic:  Alert and oriented x 4;  grossly normal neurologically. Psych:  Alert and cooperative. Normal mood and affect.  Intake/Output from previous day: 03/28 0701 - 03/29 0700 In: 480 [P.O.:480] Out: 650 [Urine:650]  Lab Results: Recent Labs    05/18/20 0829 05/19/20 0402 05/20/20 0425  WBC 7.4 8.1 7.4  HGB 8.6* 8.8* 8.7*  HCT 25.7* 26.9* 25.8*  PLT 152 155 163   BMET Recent Labs    05/18/20 0829 05/19/20 0402 05/20/20 0425  NA 135 140 144  K 4.6 3.7 3.2*  CL 102 105 102  CO2 23 25 31   GLUCOSE 155* 133* 128*  BUN 16 14 12   CREATININE 1.78* 1.44* 1.34*  CALCIUM 9.3 9.2 9.5   CT CHEST WO CONTRAST  Result Date: 05/19/2020 CLINICAL DATA:  Shortness of  breath. EXAM: CT CHEST WITHOUT CONTRAST TECHNIQUE: Multidetector CT imaging of the chest was performed following the standard protocol without IV contrast. COMPARISON:  Chest x-ray from yesterday. CT chest dated April 04, 2019. FINDINGS: Cardiovascular: No significant vascular findings. Normal heart size. No pericardial effusion. No thoracic aortic aneurysm. Coronary, aortic arch, and branch vessel atherosclerotic vascular disease. Mediastinum/Nodes: Multiple prominent subcentimeter mediastinal lymph nodes have slightly increased in size since the prior study and are likely reactive. No enlarged axillary lymph nodes. The thyroid gland, trachea, and esophagus demonstrate no significant findings. Lungs/Pleura: Patchy ground-glass densities and small nodular consolidations throughout both lungs. No cavitary lesion. Unchanged partially calcified 1.6 cm right apical nodule. No pleural effusion or pneumothorax. Upper Abdomen: No acute abnormality. Unchanged nodular liver contour and diffusely decreased hepatic density. Unchanged mild splenomegaly. Chronic right adrenal gland calcifications. Musculoskeletal: No chest wall mass or suspicious bone lesions identified. IMPRESSION: 1. Patchy ground-glass densities and small nodular consolidations throughout both lungs, consistent with multifocal pneumonia. Atypical viral pneumonia is a consideration. No cavitary lesions. 2. Cirrhosis and sequelae of portal hypertension. 3. Aortic Atherosclerosis (ICD10-I70.0). Electronically Signed   By: Titus Dubin M.D.   On: 05/19/2020 12:22   Assessment / Plan: 1) Cirrhosis 2) EtOH use disorder -Decompensated EtOH cirrhosis, possibly with superimposed alcoholic hepatitis on admission. -Needs complete abstinence of all EtOH -EGD in 01/2020 with PHG but no esophageal varices.  Repeat not needed  at this juncture from a screening/surveillance standpoint -Will need repeat MRI liver in 3 months for evaluation of LiRADS 3 lesion on  inpatient MRI.  AFP was otherwise normal/negative. -Continue lactulose, MVI, thiamine.  Lactulose dosing to be aimed at 2-4 soft BMs daily and this was discussed with the patient. -Continue supportive care  3) AKI -Possible HRS vs ATN per consulting Nephrology service.  Octreotide was stopped and midodrine was discontinued as well.  Cr continues to improve.  4)  Epistaxis/hemoptysis/epigastric pain/pain with deep breaths:  Epistaxis likely due to dry nasal passages with O2 use.  Chest CT yesterday showed multifocal PNA in both lungs.  ? Pleuritic pain.  Treatment per hospitalist service.  Hgb stable.   LOS: 7 days   Laban Emperor. Zehr  05/20/2020, 12:09 PM

## 2020-05-20 NOTE — Plan of Care (Signed)

## 2020-05-20 NOTE — Progress Notes (Signed)
Physical Therapy Treatment Patient Details Name: [name] [name] MRN: [id number] DOB: 03-Sep-1960 Today's Date: 05/20/2020    History of Present Illness 60 yo female admitted with AKI, hepatic encephalopathy, Pna. Hx of obestiy, COPD, chronic pain, anxiety, depression, chronic ETOH abuse.    PT Comments    Pt reports having a rough night and morning. She reports experiencing chest pain. O2 89% on 2L [place] with activity. Only took a few steps in the room on today. Encouraged pt to try to walk with nursing later if she feels up to it. Will continue to follow.     Follow Up Recommendations  Home health PT;Supervision - Intermittent     Equipment Recommendations  None recommended by PT    Recommendations for Other Services       Precautions / Restrictions Precautions Precautions: Fall Precaution Comments: monitor O2 Restrictions Weight Bearing Restrictions: No    Mobility  Bed Mobility Overal bed mobility: Modified Independent             General bed mobility comments: use of bed rail to assist    Transfers Overall transfer level: Needs assistance Equipment used: Rolling walker (2 wheeled) Transfers: Sit to/from [facility] Sit to Stand: Min guard Stand pivot transfers: Min guard       General transfer comment: cues for safety, hand placement. unsteady  Ambulation/Gait Ambulation/Gait assistance: Min guard Gait Distance (Feet): 5 Feet Assistive device: Rolling walker (2 wheeled) Gait Pattern/deviations: Step-through pattern;Decreased stride length     General Gait Details: only took a few steps in room on today. O2 89% on 2L [place]. unsteady   Stairs             Wheelchair Mobility    Modified Rankin (Stroke Patients Only)       Balance Overall balance assessment: History of Falls         Standing balance support: Bilateral upper extremity supported Standing balance-Leahy Scale: Poor                               Cognition Arousal/Alertness: Awake/alert Behavior During Therapy: WFL for tasks assessed/performed Overall Cognitive Status: Within Functional Limits for tasks assessed                                        Exercises      General Comments        Pertinent Vitals/Pain Pain Assessment: Faces Faces Pain Scale: Hurts little more Pain Location: chest pain Pain Descriptors / Indicators: Discomfort Pain Intervention(s): Limited activity within patient's tolerance;Monitored during session    Home Living                      Prior Function            PT Goals (current goals can now be found in the care plan section) Progress towards PT goals: Progressing toward goals    Frequency    Min 3X/week      PT Plan Current plan remains appropriate    Co-evaluation              AM-PAC PT "6 Clicks" Mobility   Outcome Measure  Help needed turning from your back to your side while in a flat bed without using bedrails?: None Help needed moving from lying on your back to sitting  on the side of a flat bed without using bedrails?: None Help needed moving to and from a bed to a chair (including a wheelchair)?: A Little Help needed standing up from a chair using your arms (e.g., wheelchair or bedside chair)?: A Little Help needed to walk in hospital room?: A Little Help needed climbing 3-5 steps with a railing? : A Little 6 Click Score: 20    End of Session   Activity Tolerance: Patient tolerated treatment well Patient left: in bed;with call bell/phone within reach;with bed alarm set   PT Visit Diagnosis: Muscle weakness (generalized) (M62.81);Difficulty in walking, not elsewhere classified (R26.2)     Time: 9030-0923 PT Time Calculation (min) (ACUTE ONLY): 16 min  Charges:  $Gait Training: 8-22 mins                        Doreatha Massed, PT Acute Rehabilitation  Office: [phone] Pager: [phone]

## 2020-05-20 NOTE — Progress Notes (Signed)
SATURATION QUALIFICATIONS: (This note is used to comply with regulatory documentation for home oxygen)  Patient Saturations on Room Air at Rest 89% Patient Saturations on Room Air while Ambulating 78% Patient Saturations on 3 Liters of oxygen while Ambulating 90%  Please briefly explain why patient needs home oxygen: Home O2 needed to provide supplemental support when completing ADL and ambulating. Pt becomes dyspneic and O2 sat decrease when performing basic daily activities.   SRP,RN

## 2020-05-20 NOTE — Progress Notes (Signed)
[facility] Progress Note  Subjective: Creatinine continues to improve after stopping HRS supportive medications.  Patient feeling okay today with some productive cough and also some pain with inspiration.  No other issues  Vitals:   05/20/20 0500 05/20/20 0508 05/20/20 0804 05/20/20 0806  BP:  (!) 151/77    Pulse:  70    Resp:  20    Temp:  99.1 F (37.3 C)    TempSrc:      SpO2:  94% 97% 97%  Weight: 109.2 kg     Height:        Exam: GEN: Obese, sitting in bed, nad ENT: no nasal discharge, mmm EYES: no scleral icterus, eomi CV: normal rate PULM: no iwob, bilateral chest rise ABD: NABS, obese, soft, slight tenderness in epigastric region SKIN: no obvious rashes EXT: 1+ pitting edema in the bilateral lower extremities up to the thigh, warm and well perfused     Home meds:  - abilify 30 qd/ prozac 20 bid/ lamictal 100 hs/ norco prn/ robaxin prn/ ultram tid prn  - lasix 20 bid prn/ lopressor 25 bid/ losartan 50 qd/ asa 81/ lipitor 40 qd  - dexilant 60 qd/ flomax 0.4 qd  - singulair 10 qd/ spiriva respimat 2 puff qd  - prn's/ vitamins/ supplements     Renal US 3/24 - bilat kidneys were not fully visualized d/t overlying bowel gas. R 13.2 cm and L 10.0 cm, no obvious hydro or mass.    UA 3/22 - 6-10 wbc, 11-20 epi, no rbc, protein 30, neg Hb, 50 glu, turbid and amber, +small bili   CXR 3/21 - IMPRESSION: 1. Diffuse interstitial and bronchial thickening which may be due to bronchial inflammation or congestive. 2. Additional patchy bilateral perihilar opacities, more typical of multifocal infection. Recommend correlation for infectious symptoms.   Alb 2.3      ECHO 3/23 >> 1. Left ventricular ejection fraction, by estimation, is 60 to 65%. The  left ventricle has normal function. The left ventricle has no regional  wall motion abnormalities. No LVH. Left ventricular diastolic parameters were  Normal.         BP's here lowest was 108/ 70, highest 144/70   RR  18- 24, HR 70- 88 range  afebrile    Wt 109 admit, 107kg today     UNa < 10, UCr 153  Assessment/ Plan: 1. AKI - creat 0.8 on admit creatinine peaked at 3.7 concerning for possible HRS versus ATN.  Creatinine continues to improve off of HRS medications.  Would start back on blood pressure medication slowly; likely best to do outpatient. 2. Pneumonia: Possible pneumonia on CT scan.  Treatment per primary 3. Volume overload/shortness of breath: Some pulmonary edema component initially now improved.  Some signs of mild volume overload.  On Lasix 20 mg twice daily at home.  IV Lasix now stopped.  Start oral Lasix 40 mg twice daily for now. 4. Cirrhosis - seen by GI, decomp etoh cirrhosis.  Overall stable 5. ETOH abuse 6. HTN -acceptable but up and down.  Would hold medications for now and restart with outpatient 7. COPD 8. Anxiety/ depression - per pmd       Reesa Chew  05/20/2020, 1:20 PM   Recent Labs  Lab 05/13/20 1906 05/14/20 0425 05/16/20 0344 05/17/20 0523 05/19/20 0402 05/20/20 0425  K  --    < > 4.4   < > 3.7 3.2*  BUN  --    < >  18   < > 14 12  CREATININE 1.10*   < > 3.70*   < > 1.44* 1.34*  CALCIUM  --    < > 8.7*   < > 9.2 9.5  PHOS 3.7  --  3.7  --   --   --   HGB 9.5*   < > 8.3*   < > 8.8* 8.7*   < > = values in this interval not displayed.   Inpatient medications: . ARIPiprazole  30 mg Oral Daily  . aspirin EC  81 mg Oral Daily  . chlorhexidine  15 mL Mouth Rinse BID  . Chlorhexidine Gluconate Cloth  6 each Topical Daily  . enoxaparin (LOVENOX) injection  40 mg Subcutaneous Q24H  . FLUoxetine  20 mg Oral BID  . fluticasone  2 spray Each Nare QHS  . folic acid  1 mg Oral Daily  . furosemide  40 mg Oral BID  . lactulose  30 g Oral BID  . lamoTRIgine  100 mg Oral QHS  . loratadine  10 mg Oral Daily  . montelukast  10 mg Oral QHS  . multivitamin with minerals  1 tablet Oral Daily  . pantoprazole  40 mg Oral Daily  . thiamine  100 mg Oral Daily    Or  . thiamine  100 mg Intravenous Daily  . umeclidinium bromide  1 puff Inhalation Daily    albuterol, HYDROcodone-acetaminophen, magic mouthwash w/lidocaine, ondansetron (ZOFRAN) IV

## 2020-05-21 DIAGNOSIS — K7469 Other cirrhosis of liver: Secondary | ICD-10-CM

## 2020-05-21 DIAGNOSIS — K7031 Alcoholic cirrhosis of liver with ascites: Secondary | ICD-10-CM | POA: Diagnosis not present

## 2020-05-21 DIAGNOSIS — F1020 Alcohol dependence, uncomplicated: Secondary | ICD-10-CM | POA: Diagnosis not present

## 2020-05-21 DIAGNOSIS — N179 Acute kidney failure, unspecified: Secondary | ICD-10-CM | POA: Diagnosis not present

## 2020-05-21 LAB — CBC WITH DIFFERENTIAL/PLATELET
Abs Immature Granulocytes: 0.02 10*3/uL (ref 0.00–0.07)
Basophils Absolute: 0 10*3/uL (ref 0.0–0.1)
Basophils Relative: 1 %
Eosinophils Absolute: 0.3 10*3/uL (ref 0.0–0.5)
Eosinophils Relative: 5 %
HCT: 25.2 % — ABNORMAL LOW (ref 36.0–46.0)
Hemoglobin: 8.4 g/dL — ABNORMAL LOW (ref 12.0–15.0)
Immature Granulocytes: 0 %
Lymphocytes Relative: 28 %
Lymphs Abs: 1.6 10*3/uL (ref 0.7–4.0)
MCH: 36.2 pg — ABNORMAL HIGH (ref 26.0–34.0)
MCHC: 33.3 g/dL (ref 30.0–36.0)
MCV: 108.6 fL — ABNORMAL HIGH (ref 80.0–100.0)
Monocytes Absolute: 0.6 10*3/uL (ref 0.1–1.0)
Monocytes Relative: 11 %
Neutro Abs: 3.1 10*3/uL (ref 1.7–7.7)
Neutrophils Relative %: 55 %
Platelets: 157 10*3/uL (ref 150–400)
RBC: 2.32 MIL/uL — ABNORMAL LOW (ref 3.87–5.11)
RDW: 18.9 % — ABNORMAL HIGH (ref 11.5–15.5)
WBC: 5.7 10*3/uL (ref 4.0–10.5)
nRBC: 0 % (ref 0.0–0.2)

## 2020-05-21 LAB — VITAMIN B1: Vitamin B1 (Thiamine): 147.6 nmol/L (ref 66.5–200.0)

## 2020-05-21 LAB — BASIC METABOLIC PANEL
Anion gap: 12 (ref 5–15)
BUN: 13 mg/dL (ref 6–20)
CO2: 30 mmol/L (ref 22–32)
Calcium: 8.8 mg/dL — ABNORMAL LOW (ref 8.9–10.3)
Chloride: 100 mmol/L (ref 98–111)
Creatinine, Ser: 1.05 mg/dL — ABNORMAL HIGH (ref 0.44–1.00)
GFR, Estimated: >60 mL/min (ref >60)
Glucose, Bld: 105 mg/dL — ABNORMAL HIGH (ref 70–99)
Potassium: 3 mmol/L — ABNORMAL LOW (ref 3.5–5.1)
Sodium: 142 mmol/L (ref 135–145)

## 2020-05-21 LAB — AMMONIA: Ammonia: 57 umol/L — ABNORMAL HIGH (ref 9–35)

## 2020-05-21 LAB — PROCALCITONIN: Procalcitonin: 0.14 ng/mL

## 2020-05-21 MED ORDER — POTASSIUM CHLORIDE CRYS ER 20 MEQ PO TBCR
40.0000 meq | EXTENDED_RELEASE_TABLET | Freq: Once | ORAL | Status: AC
  Administered 2020-05-21: 40 meq via ORAL
  Filled 2020-05-21: qty 2

## 2020-05-21 NOTE — Progress Notes (Signed)
[facility] Progress Note  Subjective: Patient feels well today without any complaints.  Trying to do rehab and get ready to go home  Vitals:   05/20/20 0806 05/20/20 1350 05/21/20 0500 05/21/20 0607  BP:  (!) 143/63  140/72  Pulse:  63  64  Resp:  16  18  Temp:  98.4 F (36.9 C)  98.5 F (36.9 C)  TempSrc:  Oral  Oral  SpO2: 97% 97%  94%  Weight:   101.9 kg   Height:        Exam: GEN: Obese, sitting in bed, nad ENT: no nasal discharge, mmm EYES: no scleral icterus, eomi CV: normal rate PULM: no iwob, bilateral chest rise ABD: NABS, obese, soft, slight tenderness in epigastric region SKIN: no obvious rashes EXT: Trace pitting edema bilateral lower extremities, warm and well perfused     Home meds:  - abilify 30 qd/ prozac 20 bid/ lamictal 100 hs/ norco prn/ robaxin prn/ ultram tid prn  - lasix 20 bid prn/ lopressor 25 bid/ losartan 50 qd/ asa 81/ lipitor 40 qd  - dexilant 60 qd/ flomax 0.4 qd  - singulair 10 qd/ spiriva respimat 2 puff qd  - prn's/ vitamins/ supplements     Renal US 3/24 - bilat kidneys were not fully visualized d/t overlying bowel gas. R 13.2 cm and L 10.0 cm, no obvious hydro or mass.    UA 3/22 - 6-10 wbc, 11-20 epi, no rbc, protein 30, neg Hb, 50 glu, turbid and amber, +small bili   CXR 3/21 - IMPRESSION: 1. Diffuse interstitial and bronchial thickening which may be due to bronchial inflammation or congestive. 2. Additional patchy bilateral perihilar opacities, more typical of multifocal infection. Recommend correlation for infectious symptoms.   Alb 2.3      ECHO 3/23 >> 1. Left ventricular ejection fraction, by estimation, is 60 to 65%. The  left ventricle has normal function. The left ventricle has no regional  wall motion abnormalities. No LVH. Left ventricular diastolic parameters were  Normal.         BP's here lowest was 108/ 70, highest 144/70   RR 18- 24, HR 70- 88 range  afebrile    Wt 109 admit, 107kg today     UNa <  10, UCr 153  Assessment/ Plan: 1. AKI - creat 0.8 on admit creatinine peaked at 3.7 concerning for possible HRS versus ATN.  Creatinine now greatly improved off of HRS medications.  Near baseline 2. Pneumonia: Possible pneumonia on CT scan.  Treatment per primary 3. Volume overload/shortness of breath: Some pulmonary edema component initially now improved.  Some signs of mild volume overload.  On Lasix 20 mg twice daily at home.  IV Lasix now stopped.  Continue oral Lasix 40 mg twice daily for now. 4. Hypokalemia: Oral repletion today.  Continue to replete as needed 5. Cirrhosis - seen by GI, decomp etoh cirrhosis.  Overall stable 6. ETOH abuse 7. HTN -acceptable but up and down.  Would hold medications for now and restart with outpatient 8. COPD 9. Anxiety/ depression - per pmd   Given the patient's significant improvement we will sign off at this time.  Please do not hesitate to contact us if needed.    Reesa Chew  05/21/2020, 10:34 AM   Recent Labs  Lab 05/16/20 0344 05/17/20 0523 05/20/20 0425 05/21/20 0435  K 4.4   < > 3.2* 3.0*  BUN 18   < > 12 13  CREATININE 3.70*   < >  1.34* 1.05*  CALCIUM 8.7*   < > 9.5 8.8*  PHOS 3.7  --   --   --   HGB 8.3*   < > 8.7* 8.4*   < > = values in this interval not displayed.   Inpatient medications: . amoxicillin-clavulanate  1 tablet Oral Q12H  . ARIPiprazole  30 mg Oral Daily  . aspirin EC  81 mg Oral Daily  . chlorhexidine  15 mL Mouth Rinse BID  . enoxaparin (LOVENOX) injection  40 mg Subcutaneous Q24H  . FLUoxetine  20 mg Oral BID  . fluticasone  2 spray Each Nare QHS  . folic acid  1 mg Oral Daily  . furosemide  40 mg Oral BID  . lactulose  30 g Oral BID  . lamoTRIgine  100 mg Oral QHS  . loratadine  10 mg Oral Daily  . montelukast  10 mg Oral QHS  . multivitamin with minerals  1 tablet Oral Daily  . pantoprazole  40 mg Oral Daily  . thiamine  100 mg Oral Daily   Or  . thiamine  100 mg Intravenous Daily  .  umeclidinium bromide  1 puff Inhalation Daily    albuterol, HYDROcodone-acetaminophen, magic mouthwash w/lidocaine, ondansetron (ZOFRAN) IV

## 2020-05-21 NOTE — Progress Notes (Signed)
Occupational Therapy Treatment Patient Details Name: [name] [name] MRN: [id number] DOB: 15-Feb-1961 Today's Date: 05/21/2020    History of present illness 60 yo female admitted with AKI, hepatic encephalopathy, Pna. Hx of obestiy, COPD, chronic pain, anxiety, depression, chronic ETOH abuse.   OT comments  Patient demonstrates ability to perform in room ambulation with RW with supervision only. Patient able to manage left sock, perform toileting, and stand at sink for grooming all with supervision only. In preparation for return home therapist recommends [facility] and shower chair to improve safety of home environment. Patient verbalized understanding. Continue to recommend [facility] OT at discharge.   Follow Up Recommendations  Home health OT    Equipment Recommendations  3 in 1 bedside commode;Tub/shower seat    Recommendations for Other Services      Precautions / Restrictions Precautions Precautions: Fall Precaution Comments: monitor O2 Restrictions Weight Bearing Restrictions: No       Mobility Bed Mobility Overal bed mobility: Modified Independent             General bed mobility comments: seated at side of bed when therapist entered the room.    Transfers Overall transfer level: Needs assistance Equipment used: Rolling walker (2 wheeled) Transfers: Sit to/from Stand Sit to Stand: Supervision         General transfer comment: Supervision with RW to perform all mobility - to bathroom, standing at sink, toilet transfer, return to room.    Balance Overall balance assessment: Mild deficits observed, not formally tested                                         ADL either performed or assessed with clinical judgement   ADL Overall ADL's : Needs assistance/impaired     Grooming: Supervision/safety;Standing                 Lower Body Dressing Details (indicate cue type and reason): able to pull up socks using figure four method and increased  time Toilet Transfer: Supervision/safety;Regular Toilet;Grab bars;RW   Toileting- Clothing Manipulation and Hygiene: Supervision/safety;Sit to/from stand       Functional mobility during ADLs: Supervision/safety;Rolling walker       Vision Patient Visual Report: No change from baseline     Perception     Praxis      Cognition Arousal/Alertness: Awake/alert Behavior During Therapy: WFL for tasks assessed/performed Overall Cognitive Status: Within Functional Limits for tasks assessed                                          Exercises     Shoulder Instructions       General Comments      Pertinent Vitals/ Pain       Pain Assessment: Faces Faces Pain Scale: Hurts a little bit Pain Location: Low back Pain Descriptors / Indicators: Grimacing Pain Intervention(s): Monitored during session  Home Living                                          Prior Functioning/Environment              Frequency  Min 2X/week        Progress Toward  Goals  OT Goals(current goals can now be found in the care plan section)  Progress towards OT goals: Progressing toward goals  Acute Rehab OT Goals Patient Stated Goal: to get better OT Goal Formulation: With patient Time For Goal Achievement: 06/02/20 Potential to Achieve Goals: Good  Plan Discharge plan remains appropriate    Co-evaluation                 AM-PAC OT "6 Clicks" Daily Activity     Outcome Measure   Help from another person eating meals?: None Help from another person taking care of personal grooming?: None Help from another person toileting, which includes using toliet, bedpan, or urinal?: None Help from another person bathing (including washing, rinsing, drying)?: None Help from another person to put on and taking off regular upper body clothing?: None Help from another person to put on and taking off regular lower body clothing?: None 6 Click Score: 24     End of Session Equipment Utilized During Treatment: Gait belt;Rolling walker;Oxygen  OT Visit Diagnosis: Other abnormalities of gait and mobility (R26.89);Muscle weakness (generalized) (M62.81);Other (comment)   Activity Tolerance Patient tolerated treatment well   Patient Left in bed;with bed alarm set   Nurse Communication Mobility status        Time: 7014-1030 OT Time Calculation (min): 14 min  Charges: OT General Charges $OT Visit: 1 Visit OT Treatments $Self Care/Home Management : 8-22 mins  Derl Barrow, OTR/L [place]  Office [phone] Pager: [place] 05/21/2020, 10:33 AM

## 2020-05-21 NOTE — TOC Progression Note (Addendum)
Transition of Care [facility]) - Progression Note    Patient Details  Name: [name] [name] MRN: [id number] Date of Birth: 1960-08-01  [facility] CM/SW Contact  Ross Ludwig, [place] Phone Number: 05/21/2020, 3:15pm  Clinical Narrative:     CSW was informed that patient will need oxygen and home health PT for home.  CSW spoke to [facility], and they can provide patient's oxygen for her.  CSW also spoke to [facility], and they can accept patient once she is medically ready for discharge.          Expected Discharge Plan and Services  Plan to return back home with home health through [facility].                                               Social Determinants of Health (SDOH) Interventions    Readmission Risk Interventions No flowsheet data found.

## 2020-05-21 NOTE — Progress Notes (Signed)
PROGRESS NOTE    ZAYAH KEILMAN  DGL:[id number] DOB: [birth year] DOA: 05/13/2020 PCP: [name], FNP    Brief Narrative:  GISSEL KEILMAN is a 60 year old female with past medical history significant for essential hypertension, chronic alcohol abuse, EtOH cirrhosis, anxiety/depression who presented to the ED on 05/13/2020 with complaint of bilateral lower extremity edema and abdominal distention.  Patient reports 20 pound weight gain over the past few months.  In the ED, calcium 4.5, chloride 102, CO2 24, glucose 111, BUN less than 5, creatinine 0.84.  Lipase 24, AST 269, ALT 65, total bilirubin 5.5.  GGT 624.  WBC 6.6, hemoglobin 9.6, MCV 106.8, platelets 170.  INR 1.4.  SARS-CoV-2/Covid-19 test negative.  Urinalysis with negative leukocytes/nitrite, 6/10 WBCs.  Ultrasound abdomen with trace abdomiopelvic ascites.  Volume overload, hospitalist service consulted for further evaluation and management.   Assessment & Plan:   Active Problems:   Anasarca   Alcoholic cirrhosis of liver with ascites (HCC)   Alcohol use disorder, severe, dependence ([place])   Bilirubinemia   Acute kidney injury ([place])   Acute liver failure without hepatic coma   Cirrhosis ([place])   Acute renal failure secondary to hepatorenal syndrome Upon presentation, patient with volume overload and 20 pound weight gain.  Patient was initially given 2 doses of IV Lasix with worsening creatinine and urine output.  Furosemide was held and nephrology was consulted.  Patient was started on midodrine and octreotide per hepatorenal syndrome protocol.  Creatinine gradually improved with improvement of urine output.  TTE 05/16/2018 with LVEF 60-65% without regional wall motion abnormalities.  Midodrine and octreotide now been discontinued. --Nephrology following, appreciate assistance --Cr 0.84>>3.70>>1.34>1.05 --Furosemide 40 mg p.o. twice daily --Continue monitor renal function daily  Acute respiratory failure with  hypoxia Multifocal pneumonia Chest x-ray 05/18/2020 with patchy bilateral pulmonary infiltrates and questionable cavitary nodules left upper chest.  CT chest 05/19/2020 with patchy groundglass densities and small nodular consolidations throughout both lungs consistent with multifocal pneumonia.  Patient's respiratory stress likely due to volume overload related to her liver cirrhosis, but given CT findings patient was started on Augmentin on 05/20/2020. --Continue supplemental oxygen, maintain SPO2 greater than 92%, currently on 2 L nasal cannula --Augmentin 875-125mg  PO BID  Stated EtOH cirrhosis imposed alcoholic hepatitis Alcohol use disorder Hepatic encephalopathy Patient presenting with elevated LFTs, AST/ALT 269 and 65 respectively with elevated total bilirubin of 5.5, INR 1.4 and elevated GGT.  Patient endorses continued alcohol abuse.  Acute hepatitis panel negative.  AFP within normal limits.  MR liver with and without contrast 05/15/2020 with nodular liver compatible with cirrhosis, tiny foci of arterial phase hyperenhancement anterior right liver that are nonspecific (LI-RADS3), steatosis small volume ascites and diffuse body wall edema. --Gastroenterology following, appreciate assistance --AST 269>>138 --ALT 65>>40 --Tbili 5.5>5.9>5.3 --ammonia 78>>57 --Continue for furosemide 40 mg p.o. twice daily as above --Lactulose 30 g p.o. twice daily, goal 2-4 soft bowel movements daily --EtOH cessation discussed --Will need repeat MR liver 3 months for f/u LI-RADS3/hyperenhancement noted on MR 05/15/2020  Hypokalemia Potassium 3.0 today, will replete. --Repeat electrolytes in the a.m. to include magnesium  Anxiety/depression --Fluoxetine 20 mg p.o. twice daily --Lamictal 100 mg p.o. nightly  GERD: Continue PPI  HLD: Holding statin due to elevated LFTs  Macrocytic anemia Hemoglobin 9.6 with MCV 106.8 on admission.  Etiology likely secondary to continued EtOH use disorder.  Iron 82,  TIBC 252, folate 9.0, vitamin B12 1855. --Discussed EtOH cessation --Folic acid 1 mg p.o. daily  Essential  hypertension Home regimen includes metoprolol tartrate 25 mg p.o. twice daily, losartan 50 mg p.o. daily. --BP 140/72 this morning --Continue to hold home antihypertensives per nephrology, recommendation to restart possibly outpatient at time of follow-up.  COPD --Singulair 10 mg p.o. nightly --Incruse Ellipta 1 puff daily --Albuterol MDI 2 puff q6h as needed wheezing/shortness of breath  DVT prophylaxis: Lovenox   Code Status: Full Code Family Communication: No family present at bedside this morning  Disposition Plan:  Level of care: Telemetry Status is: Inpatient  Remains inpatient appropriate because:Unsafe d/c plan, IV treatments appropriate due to intensity of illness or inability to take PO and Inpatient level of care appropriate due to severity of illness   Dispo: The patient is from: Home              Anticipated d/c is to: Home              Patient currently is not medically stable to d/c.   Difficult to place patient No   Consultants:   Nephrology - signed off 3/30   GI  Procedures:   Renal ultrasound  Abdominal ultrasound  TTE  Antimicrobials:   Augmentin 3/29>>   Subjective: Patient seen and examined bedside, resting comfortably.  Reports soft bowel movement this morning.  Continues with weakness, on supplemental oxygen.  Discussed with patient plans for discharge home, likely on Friday.  No other questions or concerns at this time.  Denies headache, no visual changes, no chest pain, no palpitations, no shortness of breath, no abdominal pain, no nausea/vomiting, no fever/chills/night sweats, no paresthesias.  No acute events overnight per nursing staff.  Objective: Vitals:   05/20/20 1350 05/21/20 0500 05/21/20 0607 05/21/20 1308  BP: (!) 143/63  140/72 (!) 141/72  Pulse: 63  64 61  Resp: 16  18 20   Temp: 98.4 F (36.9 C)  98.5 F  (36.9 C) 98.7 F (37.1 C)  TempSrc: Oral  Oral Oral  SpO2: 97%  94% 96%  Weight:  101.9 kg    Height:        Intake/Output Summary (Last 24 hours) at 05/21/2020 1534 Last data filed at 05/21/2020 1200 Gross per 24 hour  Intake 200 ml  Output --  Net 200 ml   Filed Weights   05/19/20 0444 05/20/20 0500 05/21/20 0500  Weight: 107 kg 109.2 kg 101.9 kg    Examination:  General exam: Appears calm and comfortable, scleral icterus noted Respiratory system: Clear to auscultation. Respiratory effort normal.  On 2 L nasal cannula with SPO2 94% at rest Cardiovascular system: S1 & S2 heard, RRR. No JVD, murmurs, rubs, gallops or clicks. No pedal edema. Gastrointestinal system: Abdomen is nondistended, soft, mild epigastric tenderness. No organomegaly or masses felt. Normal bowel sounds heard. Central nervous system: Alert and oriented. No focal neurological deficits. Extremities: Symmetric 5 x 5 power. Skin: No rashes, lesions or ulcers Psychiatry: Judgement and insight appear poor. Mood & affect appropriate.     Data Reviewed: I have personally reviewed following labs and imaging studies  CBC: Recent Labs  Lab 05/17/20 0523 05/18/20 0829 05/19/20 0402 05/20/20 0425 05/21/20 0435  WBC 5.8 7.4 8.1 7.4 5.7  NEUTROABS 3.6 5.4 6.0 4.6 3.1  HGB 9.0* 8.6* 8.8* 8.7* 8.4*  HCT 26.9* 25.7* 26.9* 25.8* 25.2*  MCV 109.3* 110.3* 110.2* 108.9* 108.6*  PLT 155 152 155 163 048   Basic Metabolic Panel: Recent Labs  Lab 05/16/20 0344 05/17/20 0523 05/18/20 8891 05/19/20 0402 05/20/20 0425 05/21/20 0435  NA 136 136 135 140 144 142  K 4.4 4.3 4.6 3.7 3.2* 3.0*  CL 102 104 102 105 102 100  CO2 25 22 23 25 31 30   GLUCOSE 85 94 155* 133* 128* 105*  BUN 18 18 16 14 12 13   CREATININE 3.70* 2.93* 1.78* 1.44* 1.34* 1.05*  CALCIUM 8.7* 8.8* 9.3 9.2 9.5 8.8*  MG 1.7  --   --   --   --   --   PHOS 3.7  --   --   --   --   --    GFR: Estimated Creatinine Clearance: 68.3 mL/min (A) (by C-G  formula based on SCr of 1.05 mg/dL (H)). Liver Function Tests: Recent Labs  Lab 05/15/20 0350 05/16/20 0344 05/17/20 0523  AST 193* 170* 138*  ALT 48* 43 40  ALKPHOS 234* 206* 202*  BILITOT 5.1* 5.4* 5.3*  PROT 5.7* 6.3* 6.6  ALBUMIN 2.3* 3.1* 3.4*   No results for input(s): LIPASE, AMYLASE in the last 168 hours. Recent Labs  Lab 05/15/20 1041 05/19/20 0402 05/20/20 0425 05/21/20 0435  AMMONIA 78* 62* 50* 57*   Coagulation Profile: Recent Labs  Lab 05/14/20 1613 05/17/20 0523  INR 1.4* 1.4*   Cardiac Enzymes: No results for input(s): CKTOTAL, CKMB, CKMBINDEX, TROPONINI in the last 168 hours. BNP (last 3 results) Recent Labs    05/12/20 1605  PROBNP 228.0*   HbA1C: No results for input(s): HGBA1C in the last 72 hours. CBG: No results for input(s): GLUCAP in the last 168 hours. Lipid Profile: No results for input(s): CHOL, HDL, LDLCALC, TRIG, CHOLHDL, LDLDIRECT in the last 72 hours. Thyroid Function Tests: No results for input(s): TSH, T4TOTAL, FREET4, T3FREE, THYROIDAB in the last 72 hours. Anemia Panel: No results for input(s): VITAMINB12, FOLATE, FERRITIN, TIBC, IRON, RETICCTPCT in the last 72 hours. Sepsis Labs: Recent Labs  Lab 05/19/20 0402 05/20/20 0425 05/21/20 0435  PROCALCITON 0.11 0.12 0.14    Recent Results (from the past 240 hour(s))  SARS CORONAVIRUS 2 (TAT 6-24 HRS) Nasopharyngeal Nasopharyngeal Swab     Status: None   Collection Time: 05/13/20  3:47 PM   Specimen: Nasopharyngeal Swab  Result Value Ref Range Status   SARS Coronavirus 2 NEGATIVE NEGATIVE Final    Comment: (NOTE) SARS-CoV-2 target nucleic acids are NOT DETECTED.  The SARS-CoV-2 RNA is generally detectable in upper and lower respiratory specimens during the acute phase of infection. Negative results do not preclude SARS-CoV-2 infection, do not rule out co-infections with other pathogens, and should not be used as the sole basis for treatment or other patient management  decisions. Negative results must be combined with clinical observations, patient history, and epidemiological information. The expected result is Negative.  Fact Sheet for Patients: SugarRoll.be  Fact Sheet for Healthcare Providers: https://www.woods-mathews.com/  This test is not yet approved or cleared by the [place] FDA and  has been authorized for detection and/or diagnosis of SARS-CoV-2 by FDA under an Emergency Use Authorization (EUA). This EUA will remain  in effect (meaning this test can be used) for the duration of the COVID-19 declaration under Se ction 564(b)(1) of the Act, 21 U.S.C. section 360bbb-3(b)(1), unless the authorization is terminated or revoked sooner.  Performed at [facility], [place] [address], [place], [place] [address]          Radiology Studies: No results found.      Scheduled Meds: . amoxicillin-clavulanate  1 tablet Oral Q12H  . ARIPiprazole  30 mg Oral  Daily  . aspirin EC  81 mg Oral Daily  . chlorhexidine  15 mL Mouth Rinse BID  . enoxaparin (LOVENOX) injection  40 mg Subcutaneous Q24H  . FLUoxetine  20 mg Oral BID  . fluticasone  2 spray Each Nare QHS  . folic acid  1 mg Oral Daily  . furosemide  40 mg Oral BID  . lactulose  30 g Oral BID  . lamoTRIgine  100 mg Oral QHS  . loratadine  10 mg Oral Daily  . montelukast  10 mg Oral QHS  . multivitamin with minerals  1 tablet Oral Daily  . pantoprazole  40 mg Oral Daily  . thiamine  100 mg Oral Daily   Or  . thiamine  100 mg Intravenous Daily  . umeclidinium bromide  1 puff Inhalation Daily   Continuous Infusions:   LOS: 8 days    Time spent: 39 minutes spent on chart review, discussion with nursing staff, consultants, updating family and interview/physical exam; more than 50% of that time was spent in counseling and/or coordination of care.    Clemmie Marxen J [place] DO [facility] Available via Epic secure chat  7am-7pm After these hours, please refer to coverage provider listed on [email or web address] 05/21/2020, 3:34 PM

## 2020-05-21 NOTE — Progress Notes (Addendum)
Attending physician's note   I have taken an interval history, reviewed the chart and examined the patient. I agree with the Advanced Practitioner's note, impression, and recommendations as outlined.   Renal function continues to improve, with creatinine now 1.05.  Tolerating p.o. intake without issue.  Will start arranging for outpatient follow-up.  GI service will sign off at this time.  Please do not hesitate to contact us with additional questions or concerns.  Crosby, DO, FACG [phone] office              [facility] Progress Note  CC:  ETOH cirrhosis  Subjective:  Up sitting in chair.  Just had a BM that was loose.  Going to eat lunch.  Still has pain when taking a deep breath.  Started on Augmentin for PNA yesterday.  Objective:  Vital signs in last 24 hours: Temp:  [98.4 F (36.9 C)-98.5 F (36.9 C)] 98.5 F (36.9 C) (03/30 0607) Pulse Rate:  [63-64] 64 (03/30 0607) Resp:  [16-18] 18 (03/30 0607) BP: (140-143)/(63-72) 140/72 (03/30 0607) SpO2:  [94 %-97 %] 94 % (03/30 0607) Weight:  [101.9 kg] 101.9 kg (03/30 0500) Last BM Date: 05/20/20 General:  Alert, Well-developed, in NAD; scleral icterus noted. Heart:  Regular rate and rhythm; no murmurs Pulm:  CTA anteriorly. Abdomen:  Soft, non-distended.  BS present.  Mild epigastric TTP. Extremities:  Without edema. Neurologic:  Alert and oriented x 4 ;  grossly normal neurologically.  Lab Results: Recent Labs    05/19/20 0402 05/20/20 0425 05/21/20 0435  WBC 8.1 7.4 5.7  HGB 8.8* 8.7* 8.4*  HCT 26.9* 25.8* 25.2*  PLT 155 163 157   BMET Recent Labs    05/19/20 0402 05/20/20 0425 05/21/20 0435  NA 140 144 142  K 3.7 3.2* 3.0*  CL 105 102 100  CO2 25 31 30   GLUCOSE 133* 128* 105*  BUN 14 12 13   CREATININE 1.44* 1.34* 1.05*  CALCIUM 9.2 9.5 8.8*   Assessment / Plan: 1) Cirrhosis 2) EtOH use disorder -Decompensated EtOH cirrhosis, possibly with superimposed alcoholic  hepatitis on admission. -Needs complete abstinence of all EtOH -EGD in 01/2020 withPHGbut no esophageal varices. Repeat not needed at this juncture from a screening/surveillance standpoint -Will need repeat MRI liver in 3 months for evaluation ofLiRADS3 lesion on inpatient MRI. AFP was otherwise normal/negative. -Continue lactulose, MVI, thiamine.  Lactulose dosing to be aimed at 2-4 soft BMs daily and this was discussed with the patient. -Continue supportive care  3) AKI -Possible HRSvsATN per consulting Nephrology service. Octreotide was stopped and midodrine was discontinued as well.  Cr continues to improve.  4)  Epistaxis/hemoptysis/epigastric pain/pain with deep breaths:  Epistaxis likely due to dry nasal passages with O2 use.  Chest CT yesterday showed multifocal PNA in both lungs.  ? Pleuritic pain.  Treatment per hospitalist service, augmentin started 3/29.  Hgb stable.   LOS: 8 days   Laban Emperor. Zehr  05/21/2020, 12:57 PM

## 2020-05-22 LAB — COMPREHENSIVE METABOLIC PANEL
ALT: 36 U/L (ref 0–44)
AST: 139 U/L — ABNORMAL HIGH (ref 15–41)
Albumin: 2.8 g/dL — ABNORMAL LOW (ref 3.5–5.0)
Alkaline Phosphatase: 140 U/L — ABNORMAL HIGH (ref 38–126)
Anion gap: 12 (ref 5–15)
BUN: 13 mg/dL (ref 6–20)
CO2: 31 mmol/L (ref 22–32)
Calcium: 8.6 mg/dL — ABNORMAL LOW (ref 8.9–10.3)
Chloride: 99 mmol/L (ref 98–111)
Creatinine, Ser: 0.98 mg/dL (ref 0.44–1.00)
GFR, Estimated: >60 mL/min (ref >60)
Glucose, Bld: 117 mg/dL — ABNORMAL HIGH (ref 70–99)
Potassium: 3 mmol/L — ABNORMAL LOW (ref 3.5–5.1)
Sodium: 142 mmol/L (ref 135–145)
Total Bilirubin: 4.8 mg/dL — ABNORMAL HIGH (ref 0.3–1.2)
Total Protein: 6.3 g/dL — ABNORMAL LOW (ref 6.5–8.1)

## 2020-05-22 LAB — MAGNESIUM
Magnesium: 0.9 mg/dL — CL (ref 1.7–2.4)
Magnesium: 1.9 mg/dL (ref 1.7–2.4)

## 2020-05-22 LAB — POTASSIUM: Potassium: 3.5 mmol/L (ref 3.5–5.1)

## 2020-05-22 MED ORDER — MAGNESIUM SULFATE 4 GM/100ML IV SOLN
4.0000 g | INTRAVENOUS | Status: AC
  Administered 2020-05-22: 4 g via INTRAVENOUS
  Filled 2020-05-22: qty 100

## 2020-05-22 MED ORDER — MAGNESIUM SULFATE 2 GM/50ML IV SOLN
2.0000 g | Freq: Once | INTRAVENOUS | Status: AC
  Administered 2020-05-22: 2 g via INTRAVENOUS
  Filled 2020-05-22: qty 50

## 2020-05-22 MED ORDER — POTASSIUM CHLORIDE CRYS ER 20 MEQ PO TBCR
40.0000 meq | EXTENDED_RELEASE_TABLET | Freq: Once | ORAL | Status: AC
  Administered 2020-05-22: 40 meq via ORAL
  Filled 2020-05-22: qty 2

## 2020-05-22 MED ORDER — POTASSIUM CHLORIDE 10 MEQ/100ML IV SOLN
10.0000 meq | INTRAVENOUS | Status: AC
Start: 2020-05-22 — End: 2020-05-22
  Administered 2020-05-22 (×3): 10 meq via INTRAVENOUS
  Filled 2020-05-22 (×3): qty 100

## 2020-05-22 MED ORDER — POTASSIUM CHLORIDE CRYS ER 20 MEQ PO TBCR
40.0000 meq | EXTENDED_RELEASE_TABLET | ORAL | Status: AC
  Administered 2020-05-22 (×2): 40 meq via ORAL
  Filled 2020-05-22 (×2): qty 2

## 2020-05-22 MED ORDER — POTASSIUM CHLORIDE CRYS ER 20 MEQ PO TBCR: 40.0000 meq | EXTENDED_RELEASE_TABLET | Freq: Once | ORAL | Status: DC

## 2020-05-22 NOTE — Progress Notes (Signed)
Physical Therapy Treatment Patient Details Name: [name] [name] MRN: [id number] DOB: 20-Nov-1960 Today's Date: 05/22/2020    History of Present Illness Pt is 60 yo female admitted with AKI, hepatic encephalopathy, Pna on 05/13/20. Hx of obestiy, COPD, chronic pain, anxiety, depression, chronic ETOH abuse.    PT Comments    Pt making good progress and was able to do stairs with 1 rail, cane, and min guard.  Did need education for sequencing on stairs and min cues with gait for posture. Pt very motivated and eager to mobilize.  Continue to advance independence and strength.    Follow Up Recommendations  Home health PT;Supervision - Intermittent     Equipment Recommendations  None recommended by PT (has RW)    Recommendations for Other Services       Precautions / Restrictions Precautions Precautions: Fall Precaution Comments: monitor O2    Mobility  Bed Mobility Overal bed mobility: Modified Independent                  Transfers Overall transfer level: Needs assistance Equipment used: Rolling walker (2 wheeled) Transfers: Sit to/from Stand Sit to Stand: Supervision         General transfer comment: Supervision with therapy but has been performed ambulation to bathroom independently today  Ambulation/Gait Ambulation/Gait assistance: Supervision Gait Distance (Feet): 200 Feet (200'x2) Assistive device: Rolling walker (2 wheeled) Gait Pattern/deviations: Step-through pattern;Decreased stride length Gait velocity: decreased   General Gait Details: Min cues for posture with RW use.  On 2 L O2 with sats 92% or greater   Stairs Stairs: Yes Stairs assistance: Min guard Stair Management: One rail Left;Step to pattern;With cane Number of Stairs: 4 General stair comments: Rail on L and cane on R- up down 4 steps.  Pt reports L knee with hx of buckling so cued for up with good and down with bad with L leg being "bad" leg   Wheelchair Mobility    Modified  [place] (Stroke Patients Only)       Balance Overall balance assessment: Needs assistance Sitting-balance support: No upper extremity supported Sitting balance-Leahy Scale: Good     Standing balance support: Bilateral upper extremity supported;No upper extremity supported Standing balance-Leahy Scale: Fair Standing balance comment: Can static stand steadily but needs UE support for ambulation                            Cognition Arousal/Alertness: Awake/alert Behavior During Therapy: WFL for tasks assessed/performed Overall Cognitive Status: Within Functional Limits for tasks assessed                                 General Comments: Pt very pleasant and motivated      Exercises      General Comments        Pertinent Vitals/Pain Pain Assessment: No/denies pain    Home Living                      Prior Function            PT Goals (current goals can now be found in the care plan section) Acute Rehab PT Goals Patient Stated Goal: to get better PT Goal Formulation: With patient Time For Goal Achievement: 06/01/20 Potential to Achieve Goals: Good Progress towards PT goals: Progressing toward goals    Frequency    Min 3X/week  PT Plan Current plan remains appropriate    Co-evaluation              AM-PAC PT "6 Clicks" Mobility   Outcome Measure  Help needed turning from your back to your side while in a flat bed without using bedrails?: None Help needed moving from lying on your back to sitting on the side of a flat bed without using bedrails?: None Help needed moving to and from a bed to a chair (including a wheelchair)?: A Little Help needed standing up from a chair using your arms (e.g., wheelchair or bedside chair)?: A Little Help needed to walk in hospital room?: A Little Help needed climbing 3-5 steps with a railing? : A Little 6 Click Score: 20    End of Session Equipment Utilized During Treatment:  Gait belt Activity Tolerance: Patient tolerated treatment well Patient left: in bed;with call bell/phone within reach (pt has been ambulating independently in room so alarm left off) Nurse Communication: Mobility status PT Visit Diagnosis: Muscle weakness (generalized) (M62.81);Difficulty in walking, not elsewhere classified (R26.2)     Time: 1610-9604 PT Time Calculation (min) (ACUTE ONLY): 30 min  Charges:  $Gait Training: 23-37 mins                     Abran Richard, PT Acute Rehab Services Pager [phone] [facility] 05/22/2020, 5:28 PM

## 2020-05-22 NOTE — Progress Notes (Signed)
[facility] ROUNDING NOTE   Subjective:   Brief history.  60 year old lady with history of essential hypertension chronic alcohol abuse EtOH cirrhosis presents to the emergency room 05/13/2020 with lower extremity swelling and abdominal distention with 20 pound weight gain.  She developed acute kidney injury secondary to hepatorenal syndrome.  She was treated on midodrine and octreotide per hepatorenal protocol.  Her creatinine appears to have improved.  Blood pressure 104 45 pulse 60 temperature 98.1 O2 sats 94%  Sodium 142 potassium 3 chloride 99 CO2 31 BUN 13 creatinine 0.98 calcium 8.6 glucose 117 albumin 2.8  No recorded urine output   Objective:  Vital signs in last 24 hours:  Temp:  [98.1 F (36.7 C)-98.7 F (37.1 C)] 98.1 F (36.7 C) (03/31 0601) Pulse Rate:  [61-67] 67 (03/31 0601) Resp:  [18-20] 18 (03/31 0601) BP: (104-141)/(45-72) 104/45 (03/31 0601) SpO2:  [83 %-97 %] 94 % (03/31 0932) Weight:  [101.4 kg] 101.4 kg (03/31 0500)  Weight change: -0.5 kg Filed Weights   05/20/20 0500 05/21/20 0500 05/22/20 0500  Weight: 109.2 kg 101.9 kg 101.4 kg    Intake/Output: I/O last 3 completed shifts: In: 440 [P.O.:440] Out: -    Intake/Output this shift:  No intake/output data recorded.  GEN: Obese, sitting in bed, nad ENT: no nasal discharge, mmm EYES: no scleral icterus, eomi CV: normal rate PULM: no iwob, bilateral chest rise ABD: NABS, obese, soft, slight tenderness in epigastric region SKIN: no obvious rashes EXT: Trace pitting edema bilateral lower extremities, warm and well perfused   Basic Metabolic Panel: Recent Labs  Lab 05/16/20 0344 05/17/20 0523 05/18/20 0829 05/19/20 0402 05/20/20 0425 05/21/20 0435 05/22/20 0357  NA 136   < > 135 140 144 142 142  K 4.4   < > 4.6 3.7 3.2* 3.0* 3.0*  CL 102   < > 102 105 102 100 99  CO2 25   < > 23 25 31 30 31   GLUCOSE 85   < > 155* 133* 128* 105* 117*  BUN 18   < > 16 14 12 13 13    CREATININE 3.70*   < > 1.78* 1.44* 1.34* 1.05* 0.98  CALCIUM 8.7*   < > 9.3 9.2 9.5 8.8* 8.6*  MG 1.7  --   --   --   --   --  0.9*  PHOS 3.7  --   --   --   --   --   --    < > = values in this interval not displayed.    Liver Function Tests: Recent Labs  Lab 05/16/20 0344 05/17/20 0523 05/22/20 0357  AST 170* 138* 139*  ALT 43 40 36  ALKPHOS 206* 202* 140*  BILITOT 5.4* 5.3* 4.8*  PROT 6.3* 6.6 6.3*  ALBUMIN 3.1* 3.4* 2.8*   No results for input(s): LIPASE, AMYLASE in the last 168 hours. Recent Labs  Lab 05/19/20 0402 05/20/20 0425 05/21/20 0435  AMMONIA 62* 50* 57*    CBC: Recent Labs  Lab 05/17/20 0523 05/18/20 0829 05/19/20 0402 05/20/20 0425 05/21/20 0435  WBC 5.8 7.4 8.1 7.4 5.7  NEUTROABS 3.6 5.4 6.0 4.6 3.1  HGB 9.0* 8.6* 8.8* 8.7* 8.4*  HCT 26.9* 25.7* 26.9* 25.8* 25.2*  MCV 109.3* 110.3* 110.2* 108.9* 108.6*  PLT 155 152 155 163 157    Cardiac Enzymes: No results for input(s): CKTOTAL, CKMB, CKMBINDEX, TROPONINI in the last 168 hours.  BNP: Invalid input(s): POCBNP  CBG: No results for input(s):  GLUCAP in the last 168 hours.  Microbiology: Results for orders placed or performed during the hospital encounter of 05/13/20  SARS CORONAVIRUS 2 (TAT 6-24 HRS) Nasopharyngeal Nasopharyngeal Swab     Status: None   Collection Time: 05/13/20  3:47 PM   Specimen: Nasopharyngeal Swab  Result Value Ref Range Status   SARS Coronavirus 2 NEGATIVE NEGATIVE Final    Comment: (NOTE) SARS-CoV-2 target nucleic acids are NOT DETECTED.  The SARS-CoV-2 RNA is generally detectable in upper and lower respiratory specimens during the acute phase of infection. Negative results do not preclude SARS-CoV-2 infection, do not rule out co-infections with other pathogens, and should not be used as the sole basis for treatment or other patient management decisions. Negative results must be combined with clinical observations, patient history, and epidemiological  information. The expected result is Negative.  Fact Sheet for Patients: SugarRoll.be  Fact Sheet for Healthcare Providers: https://www.woods-mathews.com/  This test is not yet approved or cleared by the [place] FDA and  has been authorized for detection and/or diagnosis of SARS-CoV-2 by FDA under an Emergency Use Authorization (EUA). This EUA will remain  in effect (meaning this test can be used) for the duration of the COVID-19 declaration under Se ction 564(b)(1) of the Act, 21 U.S.C. section 360bbb-3(b)(1), unless the authorization is terminated or revoked sooner.  Performed at [facility], [place] [address] [place], [place] [address]     Coagulation Studies: No results for input(s): LABPROT, INR in the last 72 hours.  Urinalysis: No results for input(s): COLORURINE, LABSPEC, PHURINE, GLUCOSEU, HGBUR, BILIRUBINUR, KETONESUR, PROTEINUR, UROBILINOGEN, NITRITE, LEUKOCYTESUR in the last 72 hours.  Invalid input(s): APPERANCEUR    Imaging: No results found.   Medications:   . magnesium sulfate bolus IVPB 4 g (05/22/20 0952)   . amoxicillin-clavulanate  1 tablet Oral Q12H  . ARIPiprazole  30 mg Oral Daily  . aspirin EC  81 mg Oral Daily  . chlorhexidine  15 mL Mouth Rinse BID  . enoxaparin (LOVENOX) injection  40 mg Subcutaneous Q24H  . FLUoxetine  20 mg Oral BID  . fluticasone  2 spray Each Nare QHS  . folic acid  1 mg Oral Daily  . furosemide  40 mg Oral BID  . lactulose  30 g Oral BID  . lamoTRIgine  100 mg Oral QHS  . loratadine  10 mg Oral Daily  . montelukast  10 mg Oral QHS  . multivitamin with minerals  1 tablet Oral Daily  . pantoprazole  40 mg Oral Daily  . potassium chloride  40 mEq Oral Q4H  . thiamine  100 mg Oral Daily   Or  . thiamine  100 mg Intravenous Daily  . umeclidinium bromide  1 puff Inhalation Daily   albuterol, HYDROcodone-acetaminophen, magic mouthwash w/lidocaine, ondansetron  (ZOFRAN) IV  Assessment/ Plan:  1. AKI - creat 0.8 on admit creatinine peaked at 3.7 concerning for possible HRS versus ATN.  Creatinine now greatly improved off of HRS medications.  Near baseline 2. Pneumonia: Possible pneumonia on CT scan.  Treatment per primary 3. Volume overload/shortness of breath: Some pulmonary edema component initially now improved.  Some signs of mild volume overload.  On Lasix 20 mg twice daily at home.  IV Lasix now stopped.  Continue oral Lasix 40 mg twice daily for now. 4. Hypokalemia: Oral repletion today.    Will add IV potassium chloride for today we will give 3 rounds 5. Cirrhosis - seen by GI, decomp etoh cirrhosis.  Overall stable 6. ETOH abuse 7. HTN -acceptable but up and down.  Would hold medications for now and restart with outpatient 8. COPD 9. Anxiety/ depression - per pmd    LOS: [place] @TODAY @10 :13 AM

## 2020-05-22 NOTE — Care Management Important Message (Signed)
Important Message  Patient Details IM Letter given to the Patient Name: SERIAH BROTZMAN MRN: [id number] Date of Birth: 08/01/1960   Medicare Important Message Given:  Yes     Kerin Salen 05/22/2020, 11:13 AM

## 2020-05-22 NOTE — Plan of Care (Signed)

## 2020-05-22 NOTE — Progress Notes (Signed)
PROGRESS NOTE    [name] [name]  UXN:[id number] DOB: December 21, 1960 DOA: 05/13/2020 PCP: [name], FNP    Brief Narrative:  [name] [name] is a 60 year old female with past medical history significant for essential hypertension, chronic alcohol abuse, EtOH cirrhosis, anxiety/depression who presented to the ED on 05/13/2020 with complaint of bilateral lower extremity edema and abdominal distention.  Patient reports 20 pound weight gain over the past few months.  In the ED, calcium 4.5, chloride 102, CO2 24, glucose 111, BUN less than 5, creatinine 0.84.  Lipase 24, AST 269, ALT 65, total bilirubin 5.5.  GGT 624.  WBC 6.6, hemoglobin 9.6, MCV 106.8, platelets 170.  INR 1.4.  SARS-CoV-2/Covid-19 test negative.  Urinalysis with negative leukocytes/nitrite, 6/10 WBCs.  Ultrasound abdomen with trace abdomiopelvic ascites.  Volume overload, hospitalist service consulted for further evaluation and management.   Assessment & Plan:   Active Problems:   Anasarca   Alcoholic cirrhosis of liver with ascites (HCC)   Alcohol use disorder, severe, dependence ([place])   Bilirubinemia   AKI (acute kidney injury) ([place])   Acute liver failure without hepatic coma   Cirrhosis (HCC)   Acute renal failure secondary to hepatorenal syndrome Upon presentation, patient with volume overload and 20 pound weight gain.  Patient was initially given 2 doses of IV Lasix with worsening creatinine and urine output.  Furosemide was held and nephrology was consulted.  Patient was started on midodrine and octreotide per hepatorenal syndrome protocol.  Creatinine gradually improved with improvement of urine output.  TTE 05/16/2018 with LVEF 60-65% without regional wall motion abnormalities.  Midodrine and octreotide now been discontinued. --Nephrology following, appreciate assistance --Cr 0.84>>3.70>>1.34>1.05>0.98 --Furosemide 40 mg p.o. twice daily --Continue monitor renal function daily  Acute respiratory failure  with hypoxia Multifocal pneumonia Chest x-ray 05/18/2020 with patchy bilateral pulmonary infiltrates and questionable cavitary nodules left upper chest.  CT chest 05/19/2020 with patchy groundglass densities and small nodular consolidations throughout both lungs consistent with multifocal pneumonia.  Patient's respiratory stress likely due to volume overload related to her liver cirrhosis, but given CT findings patient was started on Augmentin on 05/20/2020. --Continue supplemental oxygen, maintain SPO2 greater than 92%, currently on 2 L nasal cannula --Augmentin 875-125mg  PO BID  Stated EtOH cirrhosis imposed alcoholic hepatitis Alcohol use disorder Hepatic encephalopathy Patient presenting with elevated LFTs, AST/ALT 269 and 65 respectively with elevated total bilirubin of 5.5, INR 1.4 and elevated GGT.  Patient endorses continued alcohol abuse.  Acute hepatitis panel negative.  AFP within normal limits.  MR liver with and without contrast 05/15/2020 with nodular liver compatible with cirrhosis, tiny foci of arterial phase hyperenhancement anterior right liver that are nonspecific (LI-RADS3), steatosis small volume ascites and diffuse body wall edema. --AST 269>>138>139 --ALT 65>>40>36 --Tbili 5.5>5.9>5.3>4.8 --ammonia 78>>57 --Continue furosemide 40 mg p.o. BID --Lactulose 30 g p.o. twice daily, goal 2-4 soft bowel movements daily --EtOH cessation discussed --Will need repeat MR liver 3 months for f/u LI-RADS3/hyperenhancement noted on MR 05/15/2020 --Outpatient follow-up with gastroenterology  Hypokalemia Hypomagnesemia Potassium 3.0 and magnesium 0.9 today, will replete. --Repeat electrolytes this afternoon and once again in the morning  Anxiety/depression --Fluoxetine 20 mg p.o. twice daily --Lamictal 100 mg p.o. nightly  GERD: Continue PPI  HLD: Holding statin due to elevated LFTs  Macrocytic anemia Hemoglobin 9.6 with MCV 106.8 on admission.  Etiology likely secondary to  continued EtOH use disorder.  Iron 82, TIBC 252, folate 9.0, vitamin B12 1855. --Discussed EtOH cessation --Folic acid 1 mg p.o.  daily  Essential hypertension Home regimen includes metoprolol tartrate 25 mg p.o. twice daily, losartan 50 mg p.o. daily. --BP 104/45 this morning --Continue to hold home antihypertensives per nephrology, recommendation to restart possibly outpatient at time of follow-up.  COPD --Singulair 10 mg p.o. nightly --Incruse Ellipta 1 puff daily --Albuterol MDI 2 puff q6h as needed wheezing/shortness of breath  DVT prophylaxis: Lovenox   Code Status: Full Code Family Communication: No family present at bedside this morning  Disposition Plan:  Level of care: Telemetry Status is: Inpatient  Remains inpatient appropriate because:Unsafe d/c plan, IV treatments appropriate due to intensity of illness or inability to take PO and Inpatient level of care appropriate due to severity of illness   Dispo: The patient is from: Home              Anticipated d/c is to: Home              Patient currently is not medically stable to d/c.   Difficult to place patient No   Consultants:   Nephrology - signed off 3/30  [facility] - signed off 3/30  Procedures:   Renal ultrasound  Abdominal ultrasound  TTE  Antimicrobials:   Augmentin 3/29>>   Subjective: Patient seen and examined bedside, resting comfortably.  Patient requesting assistance with ramp and hospital bed for home.  Discussed with her plan for discharge home with home health tomorrow.  Patient has multiple questions for social work this morning.  No other concerns. Denies headache, no visual changes, no chest pain, no palpitations, no shortness of breath, no abdominal pain, no nausea/vomiting, no fever/chills/night sweats, no paresthesias.  No acute events overnight per nursing staff.  Objective: Vitals:   05/22/20 0500 05/22/20 0601 05/22/20 0808 05/22/20 0932  BP:  (!) 104/45    Pulse:  67     Resp:  18    Temp:  98.1 F (36.7 C)    TempSrc:      SpO2:  97% (!) 83% 94%  Weight: 101.4 kg     Height:        Intake/Output Summary (Last 24 hours) at 05/22/2020 0943 Last data filed at 05/22/2020 0321 Gross per 24 hour  Intake 340 ml  Output --  Net 340 ml   Filed Weights   05/20/20 0500 05/21/20 0500 05/22/20 0500  Weight: 109.2 kg 101.9 kg 101.4 kg    Examination:  General exam: Appears calm and comfortable, scleral icterus noted Respiratory system: Clear to auscultation. Respiratory effort normal.  On 2 L nasal cannula with SPO2 97% at rest Cardiovascular system: S1 & S2 heard, RRR. No JVD, murmurs, rubs, gallops or clicks. No pedal edema. Gastrointestinal system: Abdomen is nondistended, soft, mild epigastric tenderness. No organomegaly or masses felt. Normal bowel sounds heard. Central nervous system: Alert and oriented. No focal neurological deficits. Extremities: Symmetric 5 x 5 power. Skin: No rashes, lesions or ulcers Psychiatry: Judgement and insight appear poor. Mood & affect appropriate.     Data Reviewed: I have personally reviewed following labs and imaging studies  CBC: Recent Labs  Lab 05/17/20 0523 05/18/20 0829 05/19/20 0402 05/20/20 0425 05/21/20 0435  WBC 5.8 7.4 8.1 7.4 5.7  NEUTROABS 3.6 5.4 6.0 4.6 3.1  HGB 9.0* 8.6* 8.8* 8.7* 8.4*  HCT 26.9* 25.7* 26.9* 25.8* 25.2*  MCV 109.3* 110.3* 110.2* 108.9* 108.6*  PLT 155 152 155 163 354   Basic Metabolic Panel: Recent Labs  Lab 05/16/20 0344 05/17/20 0523 05/18/20 0829 05/19/20 0402 05/20/20  0425 05/21/20 0435 05/22/20 0357  NA 136   < > 135 140 144 142 142  K 4.4   < > 4.6 3.7 3.2* 3.0* 3.0*  CL 102   < > 102 105 102 100 99  CO2 25   < > 23 25 31 30 31   GLUCOSE 85   < > 155* 133* 128* 105* 117*  BUN 18   < > 16 14 12 13 13   CREATININE 3.70*   < > 1.78* 1.44* 1.34* 1.05* 0.98  CALCIUM 8.7*   < > 9.3 9.2 9.5 8.8* 8.6*  MG 1.7  --   --   --   --   --  0.9*  PHOS 3.7  --   --    --   --   --   --    < > = values in this interval not displayed.   GFR: Estimated Creatinine Clearance: 73 mL/min (by C-G formula based on SCr of 0.98 mg/dL). Liver Function Tests: Recent Labs  Lab 05/16/20 0344 05/17/20 0523 05/22/20 0357  AST 170* 138* 139*  ALT 43 40 36  ALKPHOS 206* 202* 140*  BILITOT 5.4* 5.3* 4.8*  PROT 6.3* 6.6 6.3*  ALBUMIN 3.1* 3.4* 2.8*   No results for input(s): LIPASE, AMYLASE in the last 168 hours. Recent Labs  Lab 05/15/20 1041 05/19/20 0402 05/20/20 0425 05/21/20 0435  AMMONIA 78* 62* 50* 57*   Coagulation Profile: Recent Labs  Lab 05/17/20 0523  INR 1.4*   Cardiac Enzymes: No results for input(s): CKTOTAL, CKMB, CKMBINDEX, TROPONINI in the last 168 hours. BNP (last 3 results) Recent Labs    05/12/20 1605  PROBNP 228.0*   HbA1C: No results for input(s): HGBA1C in the last 72 hours. CBG: No results for input(s): GLUCAP in the last 168 hours. Lipid Profile: No results for input(s): CHOL, HDL, LDLCALC, TRIG, CHOLHDL, LDLDIRECT in the last 72 hours. Thyroid Function Tests: No results for input(s): TSH, T4TOTAL, FREET4, T3FREE, THYROIDAB in the last 72 hours. Anemia Panel: No results for input(s): VITAMINB12, FOLATE, FERRITIN, TIBC, IRON, RETICCTPCT in the last 72 hours. Sepsis Labs: Recent Labs  Lab 05/19/20 0402 05/20/20 0425 05/21/20 0435  PROCALCITON 0.11 0.12 0.14    Recent Results (from the past 240 hour(s))  SARS CORONAVIRUS 2 (TAT 6-24 HRS) Nasopharyngeal Nasopharyngeal Swab     Status: None   Collection Time: 05/13/20  3:47 PM   Specimen: Nasopharyngeal Swab  Result Value Ref Range Status   SARS Coronavirus 2 NEGATIVE NEGATIVE Final    Comment: (NOTE) SARS-CoV-2 target nucleic acids are NOT DETECTED.  The SARS-CoV-2 RNA is generally detectable in upper and lower respiratory specimens during the acute phase of infection. Negative results do not preclude SARS-CoV-2 infection, do not rule out co-infections with  other pathogens, and should not be used as the sole basis for treatment or other patient management decisions. Negative results must be combined with clinical observations, patient history, and epidemiological information. The expected result is Negative.  Fact Sheet for Patients: SugarRoll.be  Fact Sheet for Healthcare Providers: https://www.woods-mathews.com/  This test is not yet approved or cleared by the [place] FDA and  has been authorized for detection and/or diagnosis of SARS-CoV-2 by FDA under an Emergency Use Authorization (EUA). This EUA will remain  in effect (meaning this test can be used) for the duration of the COVID-19 declaration under Se ction 564(b)(1) of the Act, 21 U.S.C. section 360bbb-3(b)(1), unless the authorization is terminated or revoked sooner.  Performed at [facility], [place] [address], [place], [place] [address]          Radiology Studies: No results found.      Scheduled Meds: . amoxicillin-clavulanate  1 tablet Oral Q12H  . ARIPiprazole  30 mg Oral Daily  . aspirin EC  81 mg Oral Daily  . chlorhexidine  15 mL Mouth Rinse BID  . enoxaparin (LOVENOX) injection  40 mg Subcutaneous Q24H  . FLUoxetine  20 mg Oral BID  . fluticasone  2 spray Each Nare QHS  . folic acid  1 mg Oral Daily  . furosemide  40 mg Oral BID  . lactulose  30 g Oral BID  . lamoTRIgine  100 mg Oral QHS  . loratadine  10 mg Oral Daily  . montelukast  10 mg Oral QHS  . multivitamin with minerals  1 tablet Oral Daily  . pantoprazole  40 mg Oral Daily  . potassium chloride  40 mEq Oral Q4H  . thiamine  100 mg Oral Daily   Or  . thiamine  100 mg Intravenous Daily  . umeclidinium bromide  1 puff Inhalation Daily   Continuous Infusions: . magnesium sulfate bolus IVPB       LOS: 9 days    Time spent: 36 minutes spent on chart review, discussion with nursing staff, consultants, updating family and  interview/physical exam; more than 50% of that time was spent in counseling and/or coordination of care.    Tashima Scarpulla J [place] DO [facility] Available via Epic secure chat 7am-7pm After these hours, please refer to coverage provider listed on [email or web address] 05/22/2020, 9:43 AM

## 2020-05-22 NOTE — Plan of Care (Signed)

## 2020-05-23 LAB — MAGNESIUM: Magnesium: 1.8 mg/dL (ref 1.7–2.4)

## 2020-05-23 LAB — COMPREHENSIVE METABOLIC PANEL
ALT: 40 U/L (ref 0–44)
AST: 145 U/L — ABNORMAL HIGH (ref 15–41)
Albumin: 3 g/dL — ABNORMAL LOW (ref 3.5–5.0)
Alkaline Phosphatase: 140 U/L — ABNORMAL HIGH (ref 38–126)
Anion gap: 8 (ref 5–15)
BUN: 12 mg/dL (ref 6–20)
CO2: 29 mmol/L (ref 22–32)
Calcium: 8.4 mg/dL — ABNORMAL LOW (ref 8.9–10.3)
Chloride: 102 mmol/L (ref 98–111)
Creatinine, Ser: 0.97 mg/dL (ref 0.44–1.00)
GFR, Estimated: >60 mL/min (ref >60)
Glucose, Bld: 109 mg/dL — ABNORMAL HIGH (ref 70–99)
Potassium: 3.9 mmol/L (ref 3.5–5.1)
Sodium: 139 mmol/L (ref 135–145)
Total Bilirubin: 4.8 mg/dL — ABNORMAL HIGH (ref 0.3–1.2)
Total Protein: 6.3 g/dL — ABNORMAL LOW (ref 6.5–8.1)

## 2020-05-23 MED ORDER — FUROSEMIDE 40 MG PO TABS: 40.0000 mg | ORAL_TABLET | Freq: Two times a day (BID) | ORAL | 0 refills | Status: AC

## 2020-05-23 MED ORDER — MONTELUKAST SODIUM 10 MG PO TABS: 10.0000 mg | ORAL_TABLET | Freq: Every day | ORAL | 0 refills | Status: AC

## 2020-05-23 MED ORDER — LACTULOSE 10 GM/15ML PO SOLN: 30.0000 g | Freq: Two times a day (BID) | ORAL | 0 refills | Status: AC

## 2020-05-23 MED ORDER — AMOXICILLIN-POT CLAVULANATE 875-125 MG PO TABS: 1.0000 | ORAL_TABLET | Freq: Two times a day (BID) | ORAL | 0 refills | Status: AC

## 2020-05-23 MED ORDER — FEXOFENADINE HCL 180 MG PO TABS: 180.0000 mg | ORAL_TABLET | Freq: Every day | ORAL | 0 refills | Status: AC

## 2020-05-23 NOTE — Progress Notes (Signed)
AVS given to patient and explained at the bedside. Medications and follow up appointments have been explained with pt verbalizing understanding.  

## 2020-05-23 NOTE — Discharge Instructions (Signed)
Alcohol Abuse and Dependence Information, Adult Alcohol is a widely available drug. People drink alcohol in different amounts. People who drink alcohol very often and in large amounts often have problems during and after drinking. They may develop what is called an alcohol use disorder. There are two main types of alcohol use disorders:  Alcohol abuse. This is when you use alcohol too much or too often. You may use alcohol to make yourself feel happy or to reduce stress. You may have a hard time setting a limit on the amount you drink.  Alcohol dependence. This is when you use alcohol consistently for a period of time, and your body changes as a result. This can make it hard to stop drinking because you may start to feel sick or feel different when you do not use alcohol. These symptoms are known as withdrawal. How can alcohol abuse and dependence affect me? Alcohol abuse and dependence can have a negative effect on your life. Drinking too much can lead to addiction. You may feel like you need alcohol to function normally. You may drink alcohol before work in the morning, during the day, or as soon as you get home from work in the evening. These actions can result in:  Poor work performance.  Job loss.  Financial problems.  Car crashes or criminal charges from driving after drinking alcohol.  Problems in your relationships with friends and family.  Losing the trust and respect of coworkers, friends, and family. Drinking heavily over a long period of time can permanently damage your body and brain, and can cause lifelong health issues, such as:  Damage to your liver or pancreas.  Heart problems, high blood pressure, or stroke.  Certain cancers.  Decreased ability to fight infections.  Brain or nerve damage.  Depression.  Early (premature) death. If you are careless or you crave alcohol, it is easy to drink more than your body can handle (overdose). Alcohol overdose is a serious  situation that requires hospitalization. It may lead to permanent injuries or death. What can increase my risk?  Having a family history of alcohol abuse.  Having depression or other mental health conditions.  Beginning to drink at an early age.  Binge drinking often.  Experiencing trauma, stress, and an unstable home life during childhood.  Spending time with people who drink often. What actions can I take to prevent or manage alcohol abuse and dependence?  Do not drink alcohol if: ? Your health care provider tells you not to drink. ? You are pregnant, may be pregnant, or are planning to become pregnant.  If you drink alcohol: ? Limit how much you use to:  0-1 drink a day for women.  0-2 drinks a day for men. ? Be aware of how much alcohol is in your drink. In the [place] one drink equals one 12 oz bottle of beer (355 mL), one 5 oz glass of wine (148 mL), or one 1 oz glass of hard liquor (44 mL).  Stop drinking if you have been drinking too much. This can be very hard to do if you are used to abusing alcohol. If you begin to have withdrawal symptoms, talk with your health care provider or a person that you trust. These symptoms may include anxiety, shaky hands, headache, nausea, sweating, or not being able to sleep.  Choose to drink nonalcoholic beverages in social gatherings and places where there may be alcohol. Activity  Spend more time on activities that you enjoy that do   not involve alcohol, like hobbies or exercise.  Find healthy ways to cope with stress, such as exercise, meditation, or spending time with people you care about. General information  Talk to your family, coworkers, and friends about supporting you in your efforts to stop drinking. If they drink, ask them not to drink around you. Spend more time with people who do not drink alcohol.  If you think that you have an alcohol dependency problem: ? Tell friends or family about your concerns. ? Talk with your  health care provider or another health professional about where to get help. ? Work with a Transport planner and a Regulatory affairs officer. ? Consider joining a support group for people who struggle with alcohol abuse and dependence. Where to find support  Your health care provider.  SMART Recovery: www.smartrecovery.org Therapy and support groups  Local treatment centers or chemical dependency counselors.  Local AA groups in your community: [employer]Tax.com.pt   Where to find more information  Centers for Disease Control and Prevention: http://www.wolf.info/  National Institute on Alcohol Abuse and Alcoholism: http://www.bradshaw.com/  Alcoholics Anonymous (AA): [employer].com.pt Contact a health care provider if:  You drank more or for longer than you intended on more than one occasion.  You tried to stop drinking or to cut back on how much you drink, but you were not able to.  You often drink to the point of vomiting or passing out.  You want to drink so badly that you cannot think about anything else.  You have problems in your life due to drinking, but you continue to drink.  You keep drinking even though you feel anxious, depressed, or have experienced memory loss.  You have stopped doing the things you used to enjoy in order to drink.  You have to drink more than you used to in order to get the effect you want.  You experience anxiety, sweating, nausea, shakiness, and trouble sleeping when you try to stop drinking. Get help right away if:  You have thoughts about hurting yourself or others.  You have serious withdrawal symptoms, including: ? Confusion. ? Racing heart. ? High blood pressure. ? Fever. If you ever feel like you may hurt yourself or others, or have thoughts about taking your own life, get help right away. You can go to your nearest emergency department or call:  Your local emergency services (911 in the [place]  A suicide crisis helpline, such as the [facility] at (904)321-8131. This is open 24 hours a day. Summary  Alcohol abuse and dependence can have a negative effect on your life. Drinking too much or too often can lead to addiction.  If you drink alcohol, limit how much you use.  If you are having trouble keeping your drinking under control, find ways to change your behavior. Hobbies, calming activities, exercise, or support groups can help.  If you feel you need help with changing your drinking habits, talk with your health care provider, a good friend, or a therapist, or go to an [facility] group. This information is not intended to replace advice given to you by your health care provider. Make sure you discuss any questions you have with your health care provider. Document Revised: 05/30/2018 Document Reviewed: 04/18/2018 Elsevier Patient Education  2021 [employer].   Cirrhosis  Cirrhosis is long-term (chronic) liver injury. The liver is the body's largest internal organ, and it performs many functions. It converts food into energy, removes toxic material from the blood, makes important  proteins, and absorbs necessary vitamins from food. In cirrhosis, healthy liver cells are replaced by scar tissue. This prevents blood from flowing through the liver and makes it difficult for the liver to complete its functions. What are the causes? Common causes of this condition are hepatitis C and long-term alcohol abuse. Other causes include:  Nonalcoholic fatty liver disease (NAFLD). This happens when fat is deposited in the liver by causes other than alcohol.  Hepatitis B infection.  Autoimmune hepatitis. In this condition, the body's defense system (immune system) mistakenly attacks the liver cells, causing inflammation.  Diseases that cause blockage of ducts inside the liver.  Inherited liver diseases, such as hemochromatosis. This is one of the most common inherited liver diseases. In this disease, deposits of iron collect in the liver  and other organs.  Reactions to certain long-term medicines, such as amiodarone, a heart medicine.  Parasitic infections. These include schistosomiasis, which is caused by a flatworm.  Long-term contact to certain toxins. These toxins include certain organic solvents, such as toluene and chloroform. What increases the risk? You are more likely to develop this condition if:  You have certain types of viral hepatitis.  You abuse alcohol, especially if you are female.  You are overweight.  You use IV drugs and share needles.  You have unprotected sex with someone who has viral hepatitis. What are the signs or symptoms? You may not have any signs and symptoms at first. Symptoms may not develop until the damage to your liver starts to get worse. Early symptoms may include:  Weakness and tiredness (fatigue).  Changes in sleep patterns or having trouble sleeping.  Itchiness.  Tenderness in the right-upper part of your abdomen.  Weight loss and muscle loss.  Nausea.  Loss of appetite. Later symptoms may include:  Fatigue or weakness that is getting worse.  Yellow skin and eyes (jaundice).  Buildup of fluid in the abdomen (ascites). You may notice that your clothes are tight around your waist.  Weight gain and swelling of the feet and ankles (edema).  Trouble breathing.  Easy bruising and bleeding.  Vomiting blood, or black or bloody stool.  Mental confusion. How is this diagnosed? Your health care provider may suspect cirrhosis based on your symptoms and medical history, especially if you have other medical conditions or a history of alcohol abuse. Your health care provider will do a physical exam to feel your liver and to check for signs of cirrhosis. Tests may include:  Blood tests to check: ? For hepatitis B or C. ? Kidney function. ? Liver function.  Imaging tests such as: ? MRI or CT scan to look for changes seen in advanced cirrhosis. ? Ultrasound to see  if normal liver tissue is being replaced by scar tissue.  A procedure in which a long needle is used to take a sample of liver tissue to be checked in a lab (biopsy). Liver biopsy can confirm the diagnosis of cirrhosis. How is this treated? Treatment for this condition depends on how damaged your liver is and what caused the damage. It may include treating the symptoms of cirrhosis, or treating the underlying causes to slow the damage. Treatment may include:  Making lifestyle changes, such as: ? Eating a healthy diet. You may need to work with your health care provider or a dietitian to develop an eating plan. ? Restricting salt intake. ? Maintaining a healthy weight. ? Not abusing drugs or alcohol.  Taking medicines to: ? Treat liver infections or  other infections. ? Control itching. ? Reduce fluid buildup. ? Reduce certain blood toxins. ? Reduce risk of bleeding from enlarged blood vessels in the stomach or esophagus (varices).  Liver transplant. In this procedure, a liver from a donor is used to replace your diseased liver. This is done if cirrhosis has caused liver failure. Other treatments and procedures may be done depending on the problems that you get from cirrhosis. Common problems include liver-related kidney failure (hepatorenal syndrome). Follow these instructions at home:  Take medicines only as told by your health care provider. Do not use medicines that are toxic to your liver. Ask your health care provider before taking any new medicines, including over-the-counter medicines such as NSAIDs.  Rest as needed.  Eat a well-balanced diet.  Limit your salt or water intake, if your health care provider asks you to do this.  Do not drink alcohol. This is especially important if you routinely take acetaminophen.  Keep all follow-up visits. This is important.   Contact a health care provider if you:  Have fatigue or weakness that is getting worse.  Develop swelling of the  hands, feet, or legs, or a buildup of fluid in the abdomen (ascites).  Have a fever or chills.  Develop loss of appetite.  Have nausea or vomiting.  Develop jaundice.  Develop easy bruising or bleeding. Get help right away if you:  Vomit bright red blood or a material that looks like coffee grounds.  Have blood in your stools.  Notice that your stools appear black and tarry.  Become confused.  Have chest pain or trouble breathing. These symptoms may represent a serious problem that is an emergency. Do not wait to see if the symptoms will go away. Get medical help right away. Call your local emergency services (911 in the [place] Do not drive yourself to the hospital. Summary  Cirrhosis is chronic liver injury. Common causes are hepatitis C and long-term alcohol abuse.  Tests used to diagnose cirrhosis include blood tests, imaging tests, and liver biopsy.  Treatment for this condition involves treating the underlying cause. Avoid alcohol, drugs, salt, and medicines that may damage your liver.  Get help right away if you vomit bright red blood or a material that looks like coffee grounds. This information is not intended to replace advice given to you by your health care provider. Make sure you discuss any questions you have with your health care provider. Document Revised: 11/22/2019 Document Reviewed: 11/22/2019 Elsevier Patient Education  [place].

## 2020-05-23 NOTE — Discharge Summary (Signed)
Physician Discharge Summary  [name] [name] QPY:[id number] DOB: 1960-12-22 DOA: 05/13/2020  PCP: [name], FNP  Admit date: 05/13/2020 Discharge date: 05/23/2020  Admitted From: Home Disposition: Home  Recommendations for Outpatient Follow-up:  1. Follow up with PCP in 1-2 weeks 2. Follow-up with one of our gastroenterology 3 weeks 3. Increased furosemide to 40 mg p.o. twice daily 4. Encourage compliance with lactulose, goal 2-4 soft BMs daily 5. Continue encourage complete abstinence from alcohol 6. Will need follow-up MR liver for hyperenhancement noted in 3 months 7. Please obtain CMP in one week  Home Health: PT/OT/social work Equipment/Devices: Oxygen, 2 L per nasal cannula, hospital bed, tub bench  Discharge Condition: Stable CODE STATUS: Full code Diet recommendation: Heart healthy diet  History of present illness:  [name] [name] is a 60 year old female with past medical history significant for essential hypertension, chronic alcohol abuse, EtOH cirrhosis, anxiety/depression who presented to the ED on 05/13/2020 with complaint of bilateral lower extremity edema and abdominal distention.  Patient reports 20 pound weight gain over the past few months.  In the ED, calcium 4.5, chloride 102, CO2 24, glucose 111, BUN less than 5, creatinine 0.84. Lipase 24, AST 269, ALT 65, total bilirubin 5.5.  GGT 624.  WBC 6.6, hemoglobin 9.6, MCV 106.8, platelets 170.  INR 1.4.  SARS-CoV-2/Covid-19 test negative.  Urinalysis with negative leukocytes/nitrite, 6/10 WBCs.  Ultrasound abdomen with trace abdomiopelvic ascites. Volume overload, hospitalist service consulted for further evaluation and management.  Hospital course:  Acute renal failure secondary to hepatorenal syndrome Upon presentation, patient with volume overload and 20 pound weight gain.  Patient was initially given 2 doses of IV Lasix with worsening creatinine and urine output.  Furosemide was held and nephrology was  consulted.  Patient was started on midodrine and octreotide per hepatorenal syndrome protocol.  Creatinine gradually improved with improvement of urine output.  TTE 05/16/2018 with LVEF 60-65% without regional wall motion abnormalities. Midodrine and octreotide were discontinued with improvement of renal function.  Creatinine trended up to a high of 3.70 and at time of discharge was 0.97.  Continue furosemide 40 mg p.o. twice daily.  Recommend BMP/CMP 1 week.  Acute respiratory failure with hypoxia Multifocal pneumonia Chest x-ray 05/18/2020 with patchy bilateral pulmonary infiltrates and questionable cavitary nodules left upper chest.  CT chest 05/19/2020 with patchy groundglass densities and small nodular consolidations throughout both lungs consistent with multifocal pneumonia.  Patient's respiratory stress likely due to volume overload related to her liver cirrhosis, but given CT findings patient was started on Augmentin on 05/20/2020, continue to complete 7-day course.  Discharging on supplemental oxygen, 2 L per nasal cannula.  Stated EtOH cirrhosis imposed alcoholic hepatitis Alcohol use disorder Hepatic encephalopathy Patient presenting with elevated LFTs, AST/ALT 269 and 65 respectively with elevated total bilirubin of 5.5, INR 1.4 and elevated GGT.  Patient endorses continued alcohol abuse.  Acute hepatitis panel negative.  AFP within normal limits.  MR liver with and without contrast 05/15/2020 with nodular liver compatible with cirrhosis, tiny foci of arterial phase hyperenhancement anterior right liver that are nonspecific (LI-RADS3), steatosis small volume ascites and diffuse body wall edema.  Initially started on octreotide and midodrine for hepatorenal failure as above and furosemide with improvement of anasarca.  LFTs continue to improve during hospitalization with normalization of renal function.  We will continue furosemide 40 mg p.o. twice daily.  Will need close outpatient follow-up with  [place] [facility] in 3 weeks and will need repeat MR liver 3 months  for follow-up of LI-RADS3/hyperenhancement noted on MR 05/15/2020.  Hypokalemia Hypomagnesemia Repleted during hospitalization.  Repeat BMP 1 week.  Anxiety/depression Fluoxetine 20 mg p.o. twice daily, Lamictal 100 mg p.o. nightly  GERD: Continue PPI  HLD: Holding statin due to elevated LFTs  Macrocytic anemia Hemoglobin 9.6 with MCV 106.8 on admission.  Etiology likely secondary to continued EtOH use disorder.  Iron 82, TIBC 252, folate 9.0, vitamin B12 1855.  Discussed extensively during hospitalization need for complete abstinence from alcohol use.  Essential hypertension Home regimen includes metoprolol tartrate 25 mg p.o. twice daily, losartan 50 mg p.o. daily. Continue to hold home antihypertensives per nephrology, recommendation to restart possibly outpatient at time of follow-up.  COPD Singulair 10 mg p.o. nightly, Spiriva, albuterol MDI as needed.   Discharge Diagnoses:  Active Problems:   Anasarca   Alcoholic cirrhosis of liver with ascites (HCC)   Alcohol use disorder, severe, dependence (HCC)   Bilirubinemia   Acute liver failure without hepatic coma   Cirrhosis [facility])    Discharge Instructions  Discharge Instructions    Call MD for:  difficulty breathing, headache or visual disturbances   Complete by: As directed    Call MD for:  extreme fatigue   Complete by: As directed    Call MD for:  persistant dizziness or light-headedness   Complete by: As directed    Call MD for:  persistant nausea and vomiting   Complete by: As directed    Call MD for:  severe uncontrolled pain   Complete by: As directed    Call MD for:  temperature >100.4   Complete by: As directed    Diet - low sodium heart healthy   Complete by: As directed    Increase activity slowly   Complete by: As directed      Allergies as of 05/23/2020   No Known Allergies     Medication List    STOP taking these  medications   losartan 50 MG tablet Commonly known as: COZAAR   magic mouthwash w/lidocaine Soln   mesalamine 4 g enema Commonly known as: ROWASA   methocarbamol 500 MG tablet Commonly known as: ROBAXIN   metoprolol tartrate 25 MG tablet Commonly known as: LOPRESSOR   multivitamin with minerals Tabs tablet   ondansetron 4 MG tablet Commonly known as: Zofran   traMADol 50 MG tablet Commonly known as: ULTRAM     TAKE these medications   albuterol 108 (90 Base) MCG/ACT inhaler Commonly known as: VENTOLIN HFA Inhale 2 puffs into the lungs every 6 (six) hours as needed for wheezing or shortness of breath.   amoxicillin-clavulanate 875-125 MG tablet Commonly known as: AUGMENTIN Take 1 tablet by mouth every 12 (twelve) hours for 4 days.   ARIPiprazole 30 MG tablet Commonly known as: ABILIFY Take 30 mg by mouth daily.   aspirin EC 81 MG tablet Take 1 tablet (81 mg total) by mouth daily. Swallow whole.   atorvastatin 40 MG tablet Commonly known as: LIPITOR Take 2 tablets (80 mg total) by mouth daily. What changed: how much to take   B-complex with vitamin C tablet Take 1 tablet by mouth daily.   Dexilant 60 MG capsule Generic drug: dexlansoprazole Take 1 capsule (60 mg total) by mouth daily. Office visit for further refills   diclofenac sodium 1 % Gel Commonly known as: VOLTAREN Apply 2 g topically 4 (four) times daily as needed (pain).   fexofenadine 180 MG tablet Commonly known as: ALLEGRA Take 1 tablet (180  mg total) by mouth daily.   FLUoxetine 20 MG capsule Commonly known as: PROZAC Take 20 mg by mouth 2 (two) times daily.   fluticasone 50 MCG/ACT nasal spray Commonly known as: FLONASE SPRAY TWO SPRAYS IN EACH NOSTRIL ONCE DAILY What changed:   how much to take  how to take this  when to take this  additional instructions   Flutter Devi 3 puffs by Does not apply route 4 (four) times daily as needed. Needs to use device 20 min 3-4 times a day  for coughing   folic acid 1 MG tablet Commonly known as: FOLVITE Take 1 tablet (1 mg total) by mouth daily.   furosemide 40 MG tablet Commonly known as: LASIX Take 1 tablet (40 mg total) by mouth 2 (two) times daily. What changed:   medication strength  how much to take  when to take this  reasons to take this   HYDROcodone-acetaminophen 5-325 MG tablet Commonly known as: NORCO/VICODIN Take 1 tablet by mouth every 6 (six) hours as needed for moderate pain.   lactulose 10 GM/15ML solution Commonly known as: CHRONULAC Take 45 mLs (30 g total) by mouth 2 (two) times daily.   lamoTRIgine 100 MG tablet Commonly known as: LAMICTAL Take 100 mg by mouth at bedtime.   montelukast 10 MG tablet Commonly known as: SINGULAIR Take 1 tablet (10 mg total) by mouth at bedtime.   Multivitamin Adult Extra C Chew Chew 1 tablet by mouth daily.   Narcan 4 MG/0.1ML Liqd nasal spray kit Generic drug: naloxone Place 0.4 mg into the nose as needed (overdose).   promethazine 25 MG suppository Commonly known as: PHENERGAN INSERT ONE SUPPOSITORY RECTALLY EVERY 6 HOURS AS NEEDED FOR  NAUSEA/ VOMITING   Spiriva Respimat 2.5 MCG/ACT Aers Generic drug: Tiotropium Bromide Monohydrate Inhale 2 puffs into the lungs daily.   tamsulosin 0.4 MG Caps capsule Commonly known as: FLOMAX Take 0.4 mg by mouth daily.   thiamine 50 MG tablet Commonly known as: VITAMIN B-1 Take 1 tablet (50 mg total) by mouth daily.            Durable Medical Equipment  (From admission, onward)         Start     Ordered   05/22/20 0943  For home use only DME Hospital bed  Once       Question Answer Comment  Length of Need 6 Months   The above medical condition requires: Patient requires the ability to reposition frequently   Head must be elevated greater than: 30 degrees   Bed type Semi-electric      05/22/20 0942   05/21/20 1602  For home use only DME Tub bench  Once        05/21/20 1601   05/21/20  1601  For home use only DME 3 n 1  Once        05/21/20 1601   05/20/20 1349  For home use only DME oxygen  Once       Question Answer Comment  Length of Need Lifetime   Mode or (Route) Nasal cannula   Liters per Minute 3   Frequency Continuous (stationary and portable oxygen unit needed)   Oxygen conserving device Yes   Oxygen delivery system Gas      05/20/20 Kinsey, Laura Woodruff, FNP. Schedule an appointment as soon as possible for a visit in 1 week(s).  Specialty: Internal Medicine Contact information: [address] [place] [address] [phone]        Christell Constant, MD .   Specialty: Cardiology Contact information: [address] [place] [address] [place] [address] [phone]        [facility]. Schedule an appointment as soon as possible for a visit in 3 week(s).   Specialty: Gastroenterology Contact information: [address] [place] [address] [phone]             No Known Allergies  Consultations:  [facility]  Nephrology   Procedures/Studies: CT ABDOMEN PELVIS WO CONTRAST  Result Date: 05/15/2020 CLINICAL DATA:  Acute renal insufficiency, cirrhosis EXAM: CT ABDOMEN AND PELVIS WITHOUT CONTRAST TECHNIQUE: Multidetector CT imaging of the abdomen and pelvis was performed following the standard protocol without IV contrast. COMPARISON:  05/15/2020, 03/11/2013 FINDINGS: Lower chest: Patchy bibasilar airspace disease consistent with multifocal pneumonia. No effusion. Hepatobiliary: Diffuse hepatic steatosis again noted, with heterogeneous nodularity consistent with known cirrhosis. Please refer to MRI performed earlier today for detailed description of the liver parenchyma. The gallbladder is distended, with no evidence of cholelithiasis or cholecystitis. Pancreas: Diffuse pancreatic parenchymal atrophy. No focal abnormality. Spleen: Normal in size without  focal abnormality. Adrenals/Urinary Tract: No urinary tract calculi or obstructive uropathy. Left adrenals unremarkable. Right adrenal calcification may reflect previous infection or hemorrhage. Bladder is decompressed, limiting evaluation. Stomach/Bowel: No bowel obstruction or ileus. No bowel wall thickening or inflammatory change. Vascular/Lymphatic: Evaluation of the vasculature is limited on this unenhanced exam. Numerous venous collaterals within the upper abdomen likely sequela of portal venous hypertension. Mild atherosclerosis of the abdominal aorta. No pathologic adenopathy. Reproductive: Status post hysterectomy. No adnexal masses. Other: Trace ascites. Body wall edema lower abdomen and pelvis unchanged since earlier MRI. No abdominal wall hernia. Musculoskeletal: No acute or destructive bony lesions. Reconstructed images demonstrate no additional findings. IMPRESSION: 1. Unremarkable unenhanced appearance of the kidneys. 2. Hepatic steatosis, with heterogeneous nodularity consistent with cirrhosis. Please refer to preceding MRI performed earlier today for detailed description of the liver findings. 3. Patchy bibasilar airspace disease consistent with multifocal pneumonia, stable. 4. Trace ascites. 5. Stable body wall edema. 6.  Aortic Atherosclerosis (ICD10-I70.0). Electronically Signed   By: Sharlet Salina M.D.   On: 05/15/2020 19:36   DG Chest 2 View  Result Date: 05/13/2020 CLINICAL DATA:  Pedal edema.  Bilateral foot swelling for 2 weeks. EXAM: CHEST - 2 VIEW COMPARISON:  Included portion from coronary CT 02/06/2020, chest radiograph 08/21/2019 FINDINGS: Left costophrenic angle excluded from the field of view. Lower lung volumes from prior exam. There is diffuse interstitial and bronchial thickening. Superimposed patchy airspace opacity in the left greater than right perihilar lungs. The heart is normal in size. Unchanged mediastinal contours. No significant pleural effusion. No pneumothorax. No  acute osseous abnormalities are seen. IMPRESSION: 1. Diffuse interstitial and bronchial thickening which may be due to bronchial inflammation or congestive. 2. Additional patchy bilateral perihilar opacities, more typical of multifocal infection. Recommend correlation for infectious symptoms. Electronically Signed   By: Narda Rutherford M.D.   On: 05/13/2020 17:51   CT CHEST WO CONTRAST  Result Date: 05/19/2020 CLINICAL DATA:  Shortness of breath. EXAM: CT CHEST WITHOUT CONTRAST TECHNIQUE: Multidetector CT imaging of the chest was performed following the standard protocol without IV contrast. COMPARISON:  Chest x-ray from yesterday. CT chest dated April 04, 2019. FINDINGS: Cardiovascular: No significant vascular findings. Normal heart size. No pericardial effusion. No thoracic aortic aneurysm.  Coronary, aortic arch, and branch vessel atherosclerotic vascular disease. Mediastinum/Nodes: Multiple prominent subcentimeter mediastinal lymph nodes have slightly increased in size since the prior study and are likely reactive. No enlarged axillary lymph nodes. The thyroid gland, trachea, and esophagus demonstrate no significant findings. Lungs/Pleura: Patchy ground-glass densities and small nodular consolidations throughout both lungs. No cavitary lesion. Unchanged partially calcified 1.6 cm right apical nodule. No pleural effusion or pneumothorax. Upper Abdomen: No acute abnormality. Unchanged nodular liver contour and diffusely decreased hepatic density. Unchanged mild splenomegaly. Chronic right adrenal gland calcifications. Musculoskeletal: No chest wall mass or suspicious bone lesions identified. IMPRESSION: 1. Patchy ground-glass densities and small nodular consolidations throughout both lungs, consistent with multifocal pneumonia. Atypical viral pneumonia is a consideration. No cavitary lesions. 2. Cirrhosis and sequelae of portal hypertension. 3. Aortic Atherosclerosis (ICD10-I70.0). Electronically Signed    By: Titus Dubin M.D.   On: 05/19/2020 12:22   MR LIVER W WO CONTRAST  Result Date: 05/15/2020 CLINICAL DATA:  Cirrhosis. EXAM: MRI ABDOMEN WITHOUT AND WITH CONTRAST TECHNIQUE: Multiplanar multisequence MR imaging of the abdomen was performed both before and after the administration of intravenous contrast. CONTRAST:  21mL GADAVIST GADOBUTROL 1 MMOL/ML IV SOLN COMPARISON:  Abdominal ultrasound 05/09/2020. FINDINGS: Lower chest: Heart size upper normal. There is patchy airspace disease in both lower lungs, possibly atelectasis although pneumonia not excluded. Hepatobiliary: Heterogeneous lobular liver is compatible with cirrhosis. Heterogeneous loss of signal intensity in the liver parenchyma on out of phase T1 imaging is consistent with steatosis. Tiny foci of arterial phase hyperenhancement are identified in the anterior right liver (see images 37, 39, and 48 of series 19). No focal restricted diffusion within the liver parenchyma. Gallbladder is distended without evidence of stone disease. No intrahepatic or extrahepatic biliary dilation. Pancreas: No focal mass lesion. Diffuse loss of parenchymal volume is compatible with atrophy. No discernible main duct dilatation. Spleen:  No splenomegaly. No focal mass lesion. Adrenals/Urinary Tract: No adrenal nodule or mass. Kidneys unremarkable. Stomach/Bowel: Stomach is decompressed. Duodenum is normally positioned as is the ligament of Treitz. No small bowel or colonic dilatation within the visualized abdomen. Vascular/Lymphatic: No abdominal aortic aneurysm. No abdominal lymphadenopathy. Other:  Small volume ascites. Musculoskeletal: Diffuse body wall edema No focal suspicious marrow enhancement within the visualized bony anatomy. IMPRESSION: 1. Nodular liver compatible with reported history of cirrhosis. Tiny foci of arterial phase hyperenhancement in the anterior right liver are nonspecific ( LI-RADS 3). Follow-up MRI in 3 months recommended to ensure  stability. 2. Steatosis. 3. Patchy airspace disease in both lower lungs, possibly atelectasis although pneumonia a distinct consideration. 4. Small volume ascites. 5. Diffuse body wall edema. Electronically Signed   By: Misty Stanley M.D.   On: 05/15/2020 09:51   US RENAL  Result Date: 05/15/2020 CLINICAL DATA:  Acute renal insufficiency EXAM: RENAL / URINARY TRACT ULTRASOUND COMPLETE COMPARISON:  Abdominal sonogram 06/16/2017, concurrently performed MRI examination of the abdomen FINDINGS: Right Kidney: Renal measurements: 13.2 x 6.4 x 7.8 cm = volume: 346 mL. Visualization of the right kidney is quite poor due to overlying soft tissue and obscuration of the upper and lower pole by bowel gas. The visualized renal cortical thickness is preserved and cortical echogenicity is normal. No hydronephrosis. Left Kidney: Renal measurements: At least 10.0 x 6.3 x 6.4 cm = volume: 212 mL. The lower 1/2 of the left kidney is obscured by overlying bowel gas. The visualized upper pole the left kidney demonstrates normal cortical thickness and echogenicity. There is no hydronephrosis. Bladder: Partially  decompressed. Other: None. IMPRESSION: Markedly limited, but unremarkable examination Electronically Signed   By: Fidela Salisbury MD   On: 05/15/2020 13:46   DG CHEST PORT 1 VIEW  Result Date: 05/18/2020 CLINICAL DATA:  Acute kidney injury, cirrhosis, volume overload, shortness of breath EXAM: PORTABLE CHEST 1 VIEW COMPARISON:  Portable exam 1042 hours compared to 05/12/2020 FINDINGS: Borderline enlargement of cardiac silhouette. Mediastinal contours normal. Patchy BILATERAL pulmonary infiltrates consistent with multifocal pneumonia. Infiltrates are asymmetrically greater in LEFT upper lobe. Question cavitary lesion LEFT upper lobe 2.9 x 2.6 cm. No pleural effusion or pneumothorax. Osseous structures unremarkable. IMPRESSION: Patchy BILATERAL pulmonary infiltrates consistent with multifocal pneumonia. Question cavitary  nodular focus in the LEFT upper lobe 2.9 x 2.6 cm diameter; CT chest recommended to exclude cavitary pneumonia. Electronically Signed   By: Lavonia Dana M.D.   On: 05/18/2020 12:45   ECHOCARDIOGRAM COMPLETE  Result Date: 05/15/2020    ECHOCARDIOGRAM REPORT   Patient Name:   [name] [name] Date of Exam: 05/15/2020 Medical Rec #:  [id number]      Height:       65.0 in Accession #:    [id number]     Weight:       236.1 lb Date of Birth:  September 17, 1960      BSA:          2.123 m Patient Age:    40 years       BP:           117/49 mmHg Patient Gender: F              HR:           74 bpm. Exam Location:  Inpatient Procedure: 2D Echo, Cardiac Doppler, Color Doppler and Intracardiac            Opacification Agent Indications:    CHF-Acute Diastolic H85.27  History:        Patient has no prior history of Echocardiogram examinations.                 COPD; Risk Factors:Current Smoker, Hypertension, Dyslipidemia                 and ETOH.  Sonographer:    Bernadene Person RDCS Referring Phys: [id number] [place]  1. Left ventricular ejection fraction, by estimation, is 60 to 65%. The left ventricle has normal function. The left ventricle has no regional wall motion abnormalities. Left ventricular diastolic parameters were normal.  2. Right ventricular systolic function is normal. The right ventricular size is normal. There is normal pulmonary artery systolic pressure.  3. Left atrial size was mildly dilated.  4. The mitral valve is normal in structure. Mild mitral valve regurgitation. No evidence of mitral stenosis.  5. The aortic valve is normal in structure. Aortic valve regurgitation is not visualized. No aortic stenosis is present.  6. The inferior vena cava is normal in size with greater than 50% respiratory variability, suggesting right atrial pressure of 3 mmHg. FINDINGS  Left Ventricle: Left ventricular ejection fraction, by estimation, is 60 to 65%. The left ventricle has normal function. The left ventricle  has no regional wall motion abnormalities. Definity contrast agent was given IV to delineate the left ventricular  endocardial borders. The left ventricular internal cavity size was normal in size. There is no left ventricular hypertrophy. Left ventricular diastolic parameters were normal. Right Ventricle: The right ventricular size is normal. No increase in right ventricular wall thickness. Right ventricular  systolic function is normal. There is normal pulmonary artery systolic pressure. Left Atrium: Left atrial size was mildly dilated. Right Atrium: Right atrial size was normal in size. Pericardium: There is no evidence of pericardial effusion. Mitral Valve: The mitral valve is normal in structure. Mild mitral valve regurgitation. No evidence of mitral valve stenosis. Tricuspid Valve: The tricuspid valve is normal in structure. Tricuspid valve regurgitation is mild . No evidence of tricuspid stenosis. Aortic Valve: The aortic valve is normal in structure. Aortic valve regurgitation is not visualized. No aortic stenosis is present. Pulmonic Valve: The pulmonic valve was normal in structure. Pulmonic valve regurgitation is not visualized. No evidence of pulmonic stenosis. Aorta: The aortic root is normal in size and structure. Venous: The inferior vena cava is normal in size with greater than 50% respiratory variability, suggesting right atrial pressure of 3 mmHg. IAS/Shunts: No atrial level shunt detected by color flow Doppler.  LEFT VENTRICLE PLAX 2D LVIDd:         4.40 cm  Diastology LVIDs:         2.40 cm  LV e' medial:    8.92 cm/s LV PW:         0.80 cm  LV E/e' medial:  16.4 LV IVS:        0.80 cm  LV e' lateral:   9.03 cm/s LVOT diam:     1.70 cm  LV E/e' lateral: 16.2 LV SV:         83 LV SV Index:   39 LVOT Area:     2.27 cm  RIGHT VENTRICLE RV S prime:     11.50 cm/s TAPSE (M-mode): 3.0 cm LEFT ATRIUM             Index       RIGHT ATRIUM           Index LA diam:        4.10 cm 1.93 cm/m  RA Area:      12.70 cm LA Vol (A2C):   49.1 ml 23.13 ml/m RA Volume:   24.70 ml  11.64 ml/m LA Vol (A4C):   51.0 ml 24.03 ml/m LA Biplane Vol: 50.7 ml 23.88 ml/m  AORTIC VALVE LVOT Vmax:   165.00 cm/s LVOT Vmean:  113.000 cm/s LVOT VTI:    0.365 m  AORTA Ao Root diam: 2.70 cm Ao Asc diam:  2.70 cm MITRAL VALVE MV Area (PHT): 2.76 cm     SHUNTS MV Decel Time: 275 msec     Systemic VTI:  0.36 m MV E velocity: 146.00 cm/s  Systemic Diam: 1.70 cm MV A velocity: 71.00 cm/s MV E/A ratio:  2.06 Ena Dawley MD Electronically signed by Ena Dawley MD Signature Date/Time: 05/15/2020/2:31:06 PM    Final    [place] ASCITES (ABDOMEN LIMITED)  Result Date: 05/14/2020 CLINICAL DATA:  Abdominal distension, assess for ascites and paracentesis EXAM: LIMITED ABDOMEN ULTRASOUND FOR ASCITES TECHNIQUE: Limited ultrasound survey for ascites was performed in all four abdominal quadrants. COMPARISON:  03/11/2013 FINDINGS: Survey of the abdominal 4 quadrants demonstrates only a very minimal amount of right upper quadrant and right lower quadrant ascites. There is not enough fluid to warrant paracentesis. Procedure not performed. IMPRESSION: Trace abdominopelvic ascites. Electronically Signed   By: Jerilynn Mages.  Shick M.D.   On: 05/14/2020 13:27   US Abdomen Limited RUQ (LIVER/GB)  Result Date: 05/11/2020 CLINICAL DATA:  Cirrhosis screening EXAM: ULTRASOUND ABDOMEN LIMITED RIGHT UPPER QUADRANT COMPARISON:  Abdominal ultrasound 09/25/2019 FINDINGS: Gallbladder: Limited visualization.  No gallstones or wall thickening visualized. No sonographic Murphy sign noted by sonographer. Common bile duct: Diameter: Not visualized on today's exam. Liver: No definite focal lesion identified. Diffusely increased liver parenchymal echogenicity. Contour nodularity. Portal vein is patent on color Doppler imaging with normal direction of blood flow towards the liver. Other: Mild to moderate four quadrant ascites, greatest in the right lower quadrant. IMPRESSION: 1.  Limited evaluation secondary to shadowing bowel gas and body habitus. 2. Appearance of the liver in keeping with history of cirrhosis. No new focal lesion identified. 3.  Mild to moderate four quadrant ascites. Electronically Signed   By: Audie Pinto M.D.   On: 05/11/2020 13:28      Subjective: Patient seen and examined bedside, resting comfortably.  Weakness and fatigue much improved.  Ready for discharge home today.  Discussed once again the need for complete abstinence from alcohol use, she seems to have understanding.  Also needs complete compliance with her home medication regimen in order to ensure she does not have further exacerbations of her chronic disease process.  No other complaints or concerns at this time.  Denies headache, no fever/chills/night sweats, no nausea/vomiting/diarrhea, no chest pain, no palpitations, no shortness of breath, no abdominal pain, no weakness, no fatigue, no paresthesias.  No acute events overnight per nursing staff.  Discharge Exam: Vitals:   05/23/20 0538 05/23/20 0806  BP: (!) 110/57   Pulse: 69   Resp: 18   Temp: 98.8 F (37.1 C)   SpO2: 92% 94%   Vitals:   05/22/20 1343 05/22/20 2001 05/23/20 0538 05/23/20 0806  BP: 120/61 134/70 (!) 110/57   Pulse: 68 69 69   Resp: $Remo'20 20 18   'asnPR$ Temp: 98.2 F (36.8 C) 98.4 F (36.9 C) 98.8 F (37.1 C)   TempSrc: Oral Oral Oral   SpO2: 95% 96% 92% 94%  Weight:      Height:        General: Pt is alert, awake, not in acute distress Cardiovascular: RRR, S1/S2 +, no rubs, no gallops Respiratory: CTA bilaterally, no wheezing, no rhonchi, on 2 L nasal cannula with SPO2 92% Abdominal: Soft, NT, ND, bowel sounds + Extremities: no edema, no cyanosis    The results of significant diagnostics from this hospitalization (including imaging, microbiology, ancillary and laboratory) are listed below for reference.     Microbiology: Recent Results (from the past 240 hour(s))  SARS CORONAVIRUS 2 (TAT 6-24  HRS) Nasopharyngeal Nasopharyngeal Swab     Status: None   Collection Time: 05/13/20  3:47 PM   Specimen: Nasopharyngeal Swab  Result Value Ref Range Status   SARS Coronavirus 2 NEGATIVE NEGATIVE Final    Comment: (NOTE) SARS-CoV-2 target nucleic acids are NOT DETECTED.  The SARS-CoV-2 RNA is generally detectable in upper and lower respiratory specimens during the acute phase of infection. Negative results do not preclude SARS-CoV-2 infection, do not rule out co-infections with other pathogens, and should not be used as the sole basis for treatment or other patient management decisions. Negative results must be combined with clinical observations, patient history, and epidemiological information. The expected result is Negative.  Fact Sheet for Patients: SugarRoll.be  Fact Sheet for Healthcare Providers: https://www.woods-mathews.com/  This test is not yet approved or cleared by the [place] FDA and  has been authorized for detection and/or diagnosis of SARS-CoV-2 by FDA under an Emergency Use Authorization (EUA). This EUA will remain  in effect (meaning this test can be used) for the duration of the  COVID-19 declaration under Se ction 564(b)(1) of the Act, 21 U.S.C. section 360bbb-3(b)(1), unless the authorization is terminated or revoked sooner.  Performed at [name], [place] [address] [place], [place] [address]      Labs: BNP (last 3 results) No results for input(s): BNP in the last 8760 hours. Basic Metabolic Panel: Recent Labs  Lab 05/19/20 0402 05/20/20 0425 05/21/20 0435 05/22/20 0357 05/22/20 1325 05/23/20 0355  NA 140 144 142 142  --  139  K 3.7 3.2* 3.0* 3.0* 3.5 3.9  CL 105 102 100 99  --  102  CO2 $Re'25 31 30 31  'rlY$ --  29  GLUCOSE 133* 128* 105* 117*  --  109*  BUN $Re'14 12 13 13  'gJV$ --  12  CREATININE 1.44* 1.34* 1.05* 0.98  --  0.97  CALCIUM 9.2 9.5 8.8* 8.6*  --  8.4*  MG  --   --   --  0.9* 1.9 1.8    Liver Function Tests: Recent Labs  Lab 05/17/20 0523 05/22/20 0357 05/23/20 0355  AST 138* 139* 145*  ALT 40 36 40  ALKPHOS 202* 140* 140*  BILITOT 5.3* 4.8* 4.8*  PROT 6.6 6.3* 6.3*  ALBUMIN 3.4* 2.8* 3.0*   No results for input(s): LIPASE, AMYLASE in the last 168 hours. Recent Labs  Lab 05/19/20 0402 05/20/20 0425 05/21/20 0435  AMMONIA 62* 50* 57*   CBC: Recent Labs  Lab 05/17/20 0523 05/18/20 0829 05/19/20 0402 05/20/20 0425 05/21/20 0435  WBC 5.8 7.4 8.1 7.4 5.7  NEUTROABS 3.6 5.4 6.0 4.6 3.1  HGB 9.0* 8.6* 8.8* 8.7* 8.4*  HCT 26.9* 25.7* 26.9* 25.8* 25.2*  MCV 109.3* 110.3* 110.2* 108.9* 108.6*  PLT 155 152 155 163 157   Cardiac Enzymes: No results for input(s): CKTOTAL, CKMB, CKMBINDEX, TROPONINI in the last 168 hours. BNP: Invalid input(s): POCBNP CBG: No results for input(s): GLUCAP in the last 168 hours. D-Dimer No results for input(s): DDIMER in the last 72 hours. Hgb A1c No results for input(s): HGBA1C in the last 72 hours. Lipid Profile No results for input(s): CHOL, HDL, LDLCALC, TRIG, CHOLHDL, LDLDIRECT in the last 72 hours. Thyroid function studies No results for input(s): TSH, T4TOTAL, T3FREE, THYROIDAB in the last 72 hours.  Invalid input(s): FREET3 Anemia work up No results for input(s): VITAMINB12, FOLATE, FERRITIN, TIBC, IRON, RETICCTPCT in the last 72 hours. Urinalysis    Component Value Date/Time   COLORURINE AMBER (A) 05/13/2020 1252   APPEARANCEUR TURBID (A) 05/13/2020 1252   LABSPEC 1.021 05/13/2020 1252   PHURINE 5.0 05/13/2020 1252   GLUCOSEU 50 (A) 05/13/2020 1252   GLUCOSEU NEG mg/dL 03/10/2009 2104   HGBUR NEGATIVE 05/13/2020 1252   BILIRUBINUR SMALL (A) 05/13/2020 1252   BILIRUBINUR negative 11/01/2017 1216   KETONESUR NEGATIVE 05/13/2020 1252   PROTEINUR 30 (A) 05/13/2020 1252   UROBILINOGEN 0.2 11/01/2017 1216   UROBILINOGEN 0.2 03/11/2013 1933   NITRITE NEGATIVE 05/13/2020 1252   LEUKOCYTESUR NEGATIVE  05/13/2020 1252   Sepsis Labs Invalid input(s): PROCALCITONIN,  WBC,  LACTICIDVEN Microbiology Recent Results (from the past 240 hour(s))  SARS CORONAVIRUS 2 (TAT 6-24 HRS) Nasopharyngeal Nasopharyngeal Swab     Status: None   Collection Time: 05/13/20  3:47 PM   Specimen: Nasopharyngeal Swab  Result Value Ref Range Status   SARS Coronavirus 2 NEGATIVE NEGATIVE Final    Comment: (NOTE) SARS-CoV-2 target nucleic acids are NOT DETECTED.  The SARS-CoV-2 RNA is generally detectable in upper and lower respiratory specimens  during the acute phase of infection. Negative results do not preclude SARS-CoV-2 infection, do not rule out co-infections with other pathogens, and should not be used as the sole basis for treatment or other patient management decisions. Negative results must be combined with clinical observations, patient history, and epidemiological information. The expected result is Negative.  Fact Sheet for Patients: SugarRoll.be  Fact Sheet for Healthcare Providers: https://www.woods-mathews.com/  This test is not yet approved or cleared by the [place] FDA and  has been authorized for detection and/or diagnosis of SARS-CoV-2 by FDA under an Emergency Use Authorization (EUA). This EUA will remain  in effect (meaning this test can be used) for the duration of the COVID-19 declaration under Se ction 564(b)(1) of the Act, 21 U.S.C. section 360bbb-3(b)(1), unless the authorization is terminated or revoked sooner.  Performed at [facility], [place] [address] [place], [place] [address]      Time coordinating discharge: Over 30 minutes  SIGNED:   Crislyn Willbanks J [place] DO  [facility] 05/23/2020, 9:48 AM

## 2020-05-23 NOTE — TOC Progression Note (Signed)
Transition of Care [facility]) - Progression Note    Patient Details  Name: [name] [name] MRN: [id number] Date of Birth: Sep 14, 1960  Transition of Care [facility]) CM/SW Contact  Ross Ludwig, [place] Phone Number: 05/23/2020, 2:13 PM  Clinical Narrative:     CSW was informed that patient needs oxygen, 3 in 1, and hospital bed.  CSW contacted [employer], and they will arrange to have equipment delivered to patient's home.   Barriers to Discharge: Barriers Resolved  Expected Discharge Plan and Services       Post Acute Care Choice: [place] Living arrangements for the past 2 months: Single Family Home Expected Discharge Date: 05/23/20               DME Arranged: 3-N-1,Hospital bed,Tub bench DME Agency: [employer] Date DME Agency Contacted: 05/22/20 Time DME Agency Contacted: 1100 Representative spoke with at DME Agency: Sheila [place]: PT,OT,Social Work [place]: [place] Date [place]: 05/23/20 Time [place]: 1200 Representative spoke with at [place]: [place] ([place]) Interventions    Readmission Risk Interventions No flowsheet data found.

## 2020-05-23 NOTE — TOC Transition Note (Signed)
Transition of Care [facility]) - CM/SW Discharge Note   Patient Details  Name: [name] [name] MRN: [id number] Date of Birth: Nov 30, 1960  Transition of Care [facility]) CM/SW Contact:  Ross Ludwig, LCSW Phone Number: 05/23/2020, 2:11 PM   Clinical Narrative:     Patient will be going home with home health through [facility], she will be receiving HH PT, OT and social work.  CSW signing off please reconsult with any other social work needs, home health agency has been notified of planned discharge.  Family is aware of patient discharging today.      Final next level of care: [place] Barriers to Discharge: Barriers Resolved   Patient Goals and CMS Choice Patient states their goals for this hospitalization and ongoing recovery are:: To return back home with home health. CMS Medicare.gov Compare Post Acute Care list provided to:: Patient Choice offered to / list presented to : Patient  Discharge Placement                       Discharge Plan and Services     Post Acute Care Choice: Home Health,Durable Medical Equipment          DME Arranged: 3-N-1,Hospital bed,Tub bench DME Agency: [employer] Date DME Agency Contacted: 05/22/20 Time DME Agency Contacted: 1100 Representative spoke with at DME Agency: Sheila [place]: PT,OT,Social Work [place]: [place] Date [place]: 05/23/20 Time [place]: 1200 Representative spoke with at [place]: [place] ([place]) Interventions     Readmission Risk Interventions No flowsheet data found.

## 2020-05-25 ENCOUNTER — Emergency Department (HOSPITAL_COMMUNITY): Payer: PPO

## 2020-05-25 ENCOUNTER — Emergency Department (HOSPITAL_COMMUNITY)
Admission: EM | Admit: 2020-05-25 | Discharge: 2020-05-25 | Disposition: A | Discharge status: Home / Self Care | Payer: PPO | Attending: Emergency Medicine | Admitting: Emergency Medicine

## 2020-05-25 ENCOUNTER — Encounter (HOSPITAL_COMMUNITY): Payer: Self-pay | Admitting: Emergency Medicine

## 2020-05-25 ENCOUNTER — Other Ambulatory Visit: Payer: Self-pay

## 2020-05-25 DIAGNOSIS — Z79899 Other long term (current) drug therapy: Secondary | ICD-10-CM | POA: Diagnosis not present

## 2020-05-25 DIAGNOSIS — R079 Chest pain, unspecified: Secondary | ICD-10-CM | POA: Insufficient documentation

## 2020-05-25 DIAGNOSIS — M7989 Other specified soft tissue disorders: Secondary | ICD-10-CM | POA: Diagnosis not present

## 2020-05-25 DIAGNOSIS — N189 Chronic kidney disease, unspecified: Secondary | ICD-10-CM | POA: Diagnosis not present

## 2020-05-25 DIAGNOSIS — J449 Chronic obstructive pulmonary disease, unspecified: Secondary | ICD-10-CM | POA: Insufficient documentation

## 2020-05-25 DIAGNOSIS — R0602 Shortness of breath: Secondary | ICD-10-CM | POA: Insufficient documentation

## 2020-05-25 DIAGNOSIS — E876 Hypokalemia: Secondary | ICD-10-CM

## 2020-05-25 DIAGNOSIS — R1013 Epigastric pain: Secondary | ICD-10-CM | POA: Insufficient documentation

## 2020-05-25 DIAGNOSIS — Z20822 Contact with and (suspected) exposure to covid-19: Secondary | ICD-10-CM | POA: Insufficient documentation

## 2020-05-25 DIAGNOSIS — I10 Essential (primary) hypertension: Secondary | ICD-10-CM | POA: Diagnosis not present

## 2020-05-25 DIAGNOSIS — I129 Hypertensive chronic kidney disease with stage 1 through stage 4 chronic kidney disease, or unspecified chronic kidney disease: Secondary | ICD-10-CM | POA: Insufficient documentation

## 2020-05-25 DIAGNOSIS — R1110 Vomiting, unspecified: Secondary | ICD-10-CM | POA: Diagnosis not present

## 2020-05-25 DIAGNOSIS — R112 Nausea with vomiting, unspecified: Secondary | ICD-10-CM | POA: Diagnosis not present

## 2020-05-25 DIAGNOSIS — Z7982 Long term (current) use of aspirin: Secondary | ICD-10-CM | POA: Insufficient documentation

## 2020-05-25 DIAGNOSIS — R140 Abdominal distension (gaseous): Secondary | ICD-10-CM | POA: Insufficient documentation

## 2020-05-25 LAB — CBC WITH DIFFERENTIAL/PLATELET
Abs Immature Granulocytes: 0.02 10*3/uL (ref 0.00–0.07)
Basophils Absolute: 0.1 10*3/uL (ref 0.0–0.1)
Basophils Relative: 1 %
Eosinophils Absolute: 0.2 10*3/uL (ref 0.0–0.5)
Eosinophils Relative: 2 %
HCT: 28 % — ABNORMAL LOW (ref 36.0–46.0)
Hemoglobin: 9.3 g/dL — ABNORMAL LOW (ref 12.0–15.0)
Immature Granulocytes: 0 %
Lymphocytes Relative: 11 %
Lymphs Abs: 0.9 10*3/uL (ref 0.7–4.0)
MCH: 36 pg — ABNORMAL HIGH (ref 26.0–34.0)
MCHC: 33.2 g/dL (ref 30.0–36.0)
MCV: 108.5 fL — ABNORMAL HIGH (ref 80.0–100.0)
Monocytes Absolute: 0.8 10*3/uL (ref 0.1–1.0)
Monocytes Relative: 10 %
Neutro Abs: 6.2 10*3/uL (ref 1.7–7.7)
Neutrophils Relative %: 76 %
Platelets: 230 10*3/uL (ref 150–400)
RBC: 2.58 MIL/uL — ABNORMAL LOW (ref 3.87–5.11)
RDW: 16.8 % — ABNORMAL HIGH (ref 11.5–15.5)
WBC: 8.1 10*3/uL (ref 4.0–10.5)
nRBC: 0 % (ref 0.0–0.2)

## 2020-05-25 LAB — COMPREHENSIVE METABOLIC PANEL
ALT: 40 U/L (ref 0–44)
AST: 134 U/L — ABNORMAL HIGH (ref 15–41)
Albumin: 3 g/dL — ABNORMAL LOW (ref 3.5–5.0)
Alkaline Phosphatase: 153 U/L — ABNORMAL HIGH (ref 38–126)
Anion gap: 11 (ref 5–15)
BUN: 8 mg/dL (ref 6–20)
CO2: 26 mmol/L (ref 22–32)
Calcium: 8.3 mg/dL — ABNORMAL LOW (ref 8.9–10.3)
Chloride: 102 mmol/L (ref 98–111)
Creatinine, Ser: 0.92 mg/dL (ref 0.44–1.00)
GFR, Estimated: >60 mL/min (ref >60)
Glucose, Bld: 131 mg/dL — ABNORMAL HIGH (ref 70–99)
Potassium: 3.1 mmol/L — ABNORMAL LOW (ref 3.5–5.1)
Sodium: 139 mmol/L (ref 135–145)
Total Bilirubin: 4.7 mg/dL — ABNORMAL HIGH (ref 0.3–1.2)
Total Protein: 6.8 g/dL (ref 6.5–8.1)

## 2020-05-25 LAB — RESP PANEL BY RT-PCR (FLU A&B, COVID) ARPGX2
Influenza A by PCR: NEGATIVE
Influenza B by PCR: NEGATIVE
SARS Coronavirus 2 by RT PCR: NEGATIVE

## 2020-05-25 LAB — LIPASE, BLOOD: Lipase: 25 U/L (ref 11–51)

## 2020-05-25 LAB — TROPONIN I (HIGH SENSITIVITY): Troponin I (High Sensitivity): 11 ng/L (ref <18)

## 2020-05-25 LAB — BRAIN NATRIURETIC PEPTIDE: B Natriuretic Peptide: 309.4 pg/mL — ABNORMAL HIGH (ref 0.0–100.0)

## 2020-05-25 MED ORDER — SUCRALFATE 1 GM/10ML PO SUSP
1.0000 g | Freq: Once | ORAL | Status: AC
  Administered 2020-05-25: 1 g via ORAL
  Filled 2020-05-25: qty 10

## 2020-05-25 MED ORDER — POTASSIUM CHLORIDE ER 20 MEQ PO TBCR: 20.0000 meq | EXTENDED_RELEASE_TABLET | Freq: Every day | ORAL | 0 refills | Status: AC

## 2020-05-25 MED ORDER — POTASSIUM CHLORIDE 20 MEQ PO PACK
40.0000 meq | PACK | Freq: Every day | ORAL | Status: DC
  Administered 2020-05-25: 40 meq via ORAL
  Filled 2020-05-25: qty 2

## 2020-05-25 MED ORDER — IOHEXOL 300 MG/ML  SOLN
100.0000 mL | Freq: Once | INTRAMUSCULAR | Status: AC | PRN
  Administered 2020-05-25: 100 mL via INTRAVENOUS

## 2020-05-25 MED ORDER — PANTOPRAZOLE SODIUM 40 MG IV SOLR
40.0000 mg | Freq: Once | INTRAVENOUS | Status: AC
  Administered 2020-05-25: 40 mg via INTRAVENOUS
  Filled 2020-05-25: qty 40

## 2020-05-25 MED ORDER — ONDANSETRON HCL 4 MG/2ML IJ SOLN
4.0000 mg | Freq: Once | INTRAMUSCULAR | Status: AC
  Administered 2020-05-25: 4 mg via INTRAVENOUS
  Filled 2020-05-25: qty 2

## 2020-05-25 NOTE — ED Notes (Signed)
Bladder scan did not show any urine x3.

## 2020-05-25 NOTE — Discharge Instructions (Addendum)
Follow-up with your primary doctor and gastroenterologist.  Continue taking the antibiotics as previously prescribed.  If you develop uncontrolled pain, nausea, vomiting or other new concern symptom, return to ER for reassessment.

## 2020-05-25 NOTE — ED Triage Notes (Signed)
Patient reports d/c on 4/1 with CHF and liver failure. Reports vomiting with abdominal distension since discharge. 2L [place] at baseline.

## 2020-05-25 NOTE — ED Provider Notes (Signed)
[facility] DEPT Provider Note   CSN: [id number] Arrival date & time: 05/25/20  1531     History Chief Complaint  Patient presents with  . Bloated  . Emesis    [name] [name] is a 60 y.o. female.  HPI    60 year old female with a history of pancreatitis, gastritis, bipolar disorder, COPD, CKD, depressive disorder, diarrhea, EtOH abuse, GERD, hiatal hernia, hyperlipidemia, hypertension, cirrhosis, seizures, who presents the emergency department today for evaluation of nausea and vomiting.  She states this started last night.  She also reports abd bloating, left upper quadrant and epigastric abdominal pain.  She states she has a history of gastritis and this feels similar.  She reports chills/sweats but no fevers.  She denies any diarrhea, constipation, urinary symptoms.  She reports some left-sided chest pain that occurred for short period of time while she was waiting however this is since improved.  She has had some lower extremity swelling that she states goes up and down since she was discharged from the hospital recently.  Past Medical History:  Diagnosis Date  . ABDOMINAL PAIN -GENERALIZED 09/27/2008   Qualifier: Diagnosis of  By: Trellis Paganini PA-c, Amy S   . Acute epigastric pain 04/27/2011  . Acute pancreatitis 08/12/2012  . Allergic rhinitis 09/05/2017  . Anal fistula   . Anemia   . Anxiety   . Arthritis   . BIPOLAR AFFECTIVE DISORDER 12/06/2006   Qualifier: History of  By: Elsworth Soho MD, Leanna Sato Bipolar disorder ([place])   . Chronic kidney disease   . COPD (chronic obstructive pulmonary disease) ([place]) 11/08/2017  . Cough 12/06/2006   Annotation: chronic since'96, nml spirometry, +ve methacholine challenge, nml  CT sinuses, inconclusive pH probe, esophageal manometry Centricity Description: SYMPTOM, COUGH Qualifier: History of  By: Elsworth Soho MD, Leanna Sato   Centricity Description: COUGH Qualifier: Diagnosis of  By: Elsworth Soho MD, Leanna Sato    . Depressive  disorder, not elsewhere classified   . Diarrhea 09/27/2008   Qualifier: Diagnosis of  By: Trellis Paganini PA-c, Amy S   . Diverticulosis of colon (without mention of hemorrhage)   . Elevated liver enzymes 10/31/2015  . Esophageal reflux   . ETOH abuse 08/14/2012  . Gastritis 04/28/2011  . GERD (gastroesophageal reflux disease)   . Hematochezia 08/14/2012  . Hemoptysis 05/26/2017  . HEMORRHOIDS 09/12/2007   Qualifier: Diagnosis of  By: Nils Pyle CMA ([place]), Mearl Latin    . Hepatic steatosis   . HIATAL HERNIA 09/12/2007   Qualifier: Diagnosis of  By: Nils Pyle CMA ([place]), Mearl Latin    . History of recurrent UTIs   . Hyperlipidemia   . Hypertension   . Hypertensive retinopathy of both eyes 11/24/2015  . INFECTIOUS DIARRHEA 09/27/2008   Qualifier: Diagnosis of  By: Laney Potash, Pam    . Iron deficiency   . Iron overload 09/22/2017  . Nausea alone 09/27/2008   Qualifier: Diagnosis of  By: Laney Potash, Geraldine 09/27/2008   Qualifier: Diagnosis of  By: Shane Crutch, Amy S   . Obesity, Class III, BMI 40-49.9 (morbid obesity) ([place]) 04/27/2011  . Personal history of colonic polyps 10/16/2009   hyperplastic  . Polyarthralgia 12/02/2015   Evaluated by Dr. Charlestine Night (rheumatologist) 12/29/2015:1): 1)possible fibromyalgia (Suggest physical therapy and flexeril), 2) Polyarthralgia/Insomnia (suggest use of robaxin or flexeril), 3) Nicotine Dependence (counseled to quit). 4)Depression (continue therapy with psychiatrist)  . Pre-diabetes   . Pseudocyst of pancreas 08/12/2012  . Pulmonary nodule,  right 11/29/2016  . Rectal fissure 05/11/2011  . Rectal fistula 05/11/2011  . RECTAL PAIN 09/12/2007   Qualifier: Diagnosis of  By: Nils Pyle CMA ([place]), Mearl Latin    . Secondary hemochromatosis 09/22/2017  . Seizures ([place])   . SIRS (systemic inflammatory response syndrome) ([place]) 03/11/2013  . Solitary rectal ulcer 04/12/2011  . TOBACCO ABUSE 12/06/2006   Qualifier: History of  By: Elsworth Soho MD, Leanna Sato.    . Ulcerative proctitis,  nonspecific ([place]) 03/10/2011  . Varicose veins of both lower extremities 12/02/2015    Patient Active Problem List   Diagnosis Date Noted  . Acute liver failure without hepatic coma   . Cirrhosis ([place])   . Alcoholic cirrhosis of liver with ascites ([place])   . Alcohol use disorder, severe, dependence ([place])   . Bilirubinemia   . Anasarca 05/13/2020  . Anemia due to acquired thiamine deficiency 03/12/2020  . Cushingoid facies 03/06/2020  . Deficiency anemia 03/06/2020  . Palpitations 01/03/2020  . Chest pain of uncertain etiology 42/35/3614  . Aortic atherosclerosis ([place]) 01/03/2020  . NAFLD (nonalcoholic fatty liver disease) 01/03/2020  . Pain in left knee 06/12/2019  . COPD, mild ([place]) 11/08/2017  . Iron overload 09/22/2017  . Secondary hemochromatosis 09/22/2017  . Allergic rhinitis 09/05/2017  . Hemoptysis 05/26/2017  . Pulmonary nodule, right 11/29/2016  . Polyarthralgia 12/02/2015  . Varicose veins of both lower extremities 12/02/2015  . Hypertensive retinopathy of both eyes 11/24/2015  . Elevated liver enzymes 10/31/2015  . Hyperglycemia 10/31/2015  . SIRS (systemic inflammatory response syndrome) ([place]) 03/11/2013  . Hypertension   . Hyperlipidemia   . Anemia   . Hematochezia 08/14/2012  . ETOH abuse 08/14/2012  . Pseudocyst of pancreas 08/12/2012  . GERD (gastroesophageal reflux disease) 06/08/2012  . Rectal fissure 05/11/2011  . Rectal fistula 05/11/2011  . Hiatal hernia 04/28/2011  . Gastritis 04/28/2011  . Nausea 04/27/2011  . Acute epigastric pain 04/27/2011  . Obesity, Class III, BMI 40-49.9 (morbid obesity) ([place]) 04/27/2011  . Solitary rectal ulcer 04/12/2011  . Hemorrhoids 03/10/2011  . Ulcerative proctitis, nonspecific ([place]) 03/10/2011  . DIVERTICULOSIS, COLON 10/22/2009  . COLONIC POLYPS, HYPERPLASTIC, HX OF 10/22/2009  . INFECTIOUS DIARRHEA 09/27/2008  . ULCERATIVE PROCTITIS 09/27/2008  . NAUSEA AND VOMITING 09/27/2008  . NAUSEA ALONE 09/27/2008  .  DIARRHEA 09/27/2008  . ABDOMINAL PAIN -GENERALIZED 09/27/2008  . DEPRESSION 09/12/2007  . HEMORRHOIDS 09/12/2007  . Diaphragmatic hernia 09/12/2007  . ULCERATIVE COLITIS 09/12/2007  . RECTAL PAIN 09/12/2007  . BIPOLAR AFFECTIVE DISORDER 12/06/2006  . Current every day smoker 12/06/2006  . Esophageal reflux 12/06/2006  . Cough 12/06/2006    Past Surgical History:  Procedure Laterality Date  . FINGER AMPUTATION  2010   right index  . HEMORROIDECTOMY    . KNEE ARTHROSCOPY     bilateral, left x2  . TOTAL ABDOMINAL HYSTERECTOMY    . VIDEO BRONCHOSCOPY Bilateral 05/31/2017   Procedure: VIDEO BRONCHOSCOPY WITHOUT FLUORO;  Surgeon: Collene Gobble, MD;  Location: [facility];  Service: Cardiopulmonary;  Laterality: Bilateral;     OB History   No obstetric history on file.     Family History  Problem Relation Age of Onset  . Irritable bowel syndrome Mother   . Hypertension Mother   . Atrial fibrillation Mother   . Ovarian cancer Maternal Aunt   . Diabetes Father   . Heart attack Father   . Hypertension Father   . Hyperlipidemia Father   . Thyroid disease Sister   . Hyperlipidemia Sister   .  Thyroid disease Brother   . Colon polyps Brother   . Stomach cancer Neg Hx   . Colon cancer Neg Hx     Social History   Tobacco Use  . Smoking status: Former Smoker    Packs/day: 0.25    Years: 46.00    Pack years: 11.50    Types: Cigars, Cigarettes    Quit date: 04/23/2019    Years since quitting: 1.0  . Smokeless tobacco: Never Used  . Tobacco comment: Pt states vaping every day.  stopped vaping.  Vaping Use  . Vaping Use: Never used  Substance Use Topics  . Alcohol use: Yes    Alcohol/week: 1.0 standard drink    Types: 1 Shots of liquor per week    Comment: occ  . Drug use: No    Home Medications Prior to Admission medications   Medication Sig Start Date End Date Taking? Authorizing Provider  albuterol (VENTOLIN HFA) 108 (90 Base) MCG/ACT inhaler Inhale 2 puffs into  the lungs every 6 (six) hours as needed for wheezing or shortness of breath. 12/05/19   Collene Gobble, MD  amoxicillin-clavulanate (AUGMENTIN) 875-125 MG tablet Take 1 tablet by mouth every 12 (twelve) hours for 4 days. 05/23/20 05/27/20  [place] Donnamarie Poag, DO  ARIPiprazole (ABILIFY) 30 MG tablet Take 30 mg by mouth daily.  08/07/17   [provider]  aspirin EC 81 MG tablet Take 1 tablet (81 mg total) by mouth daily. Swallow whole. 02/07/20   Chandrasekhar, Lyda Kalata A, MD  atorvastatin (LIPITOR) 40 MG tablet Take 2 tablets (80 mg total) by mouth daily. Patient taking differently: Take 40 mg by mouth daily. 02/12/20   Chandrasekhar, Lyda Kalata A, MD  B Complex-C (B-COMPLEX WITH VITAMIN C) tablet Take 1 tablet by mouth daily.    [provider]  dexlansoprazole (DEXILANT) 60 MG capsule Take 1 capsule (60 mg total) by mouth daily. Office visit for further refills 07/24/19   Irene Shipper, MD  diclofenac sodium (VOLTAREN) 1 % GEL Apply 2 g topically 4 (four) times daily as needed (pain). 10/24/15   [provider]  fexofenadine (ALLEGRA) 180 MG tablet Take 1 tablet (180 mg total) by mouth daily. 05/23/20 08/21/20  [place] Donnamarie Poag, DO  FLUoxetine (PROZAC) 20 MG capsule Take 20 mg by mouth 2 (two) times daily.  12/01/16   [provider]  fluticasone (FLONASE) 50 MCG/ACT nasal spray SPRAY TWO SPRAYS IN EACH NOSTRIL ONCE DAILY Patient taking differently: Place 2 sprays into both nostrils at bedtime. 08/21/19   [name], FNP  folic acid (FOLVITE) 1 MG tablet Take 1 tablet (1 mg total) by mouth daily. 03/07/20   Janith Lima, MD  furosemide (LASIX) 40 MG tablet Take 1 tablet (40 mg total) by mouth 2 (two) times daily. 05/23/20 08/21/20  [place] Eric J, DO  HYDROcodone-acetaminophen (NORCO/VICODIN) 5-325 MG tablet Take 1 tablet by mouth every 6 (six) hours as needed for moderate pain.    [provider]  lactulose (CHRONULAC) 10 GM/15ML solution Take 45 mLs (30 g total)  by mouth 2 (two) times daily. 05/23/20 08/21/20  [place] Donnamarie Poag, DO  lamoTRIgine (LAMICTAL) 100 MG tablet Take 100 mg by mouth at bedtime.    [provider]  montelukast (SINGULAIR) 10 MG tablet Take 1 tablet (10 mg total) by mouth at bedtime. 05/23/20 08/21/20  [place] Eric J, DO  Multiple Vitamins-Minerals (MULTIVITAMIN ADULT EXTRA C) CHEW Chew 1 tablet by mouth daily.    [provider]  NARCAN 4 MG/0.1ML LIQD nasal spray kit Place 0.4 mg into the nose as needed (overdose). 04/20/17   [provider]  promethazine (PHENERGAN) 25 MG suppository INSERT ONE SUPPOSITORY RECTALLY EVERY 6 HOURS AS NEEDED FOR  NAUSEA/ VOMITING 05/06/20   Irene Shipper, MD  Respiratory Therapy Supplies (FLUTTER) DEVI 3 puffs by Does not apply route 4 (four) times daily as needed. Needs to use device 20 min 3-4 times a day for coughing 07/05/19   Martyn Ehrich, NP  tamsulosin (FLOMAX) 0.4 MG CAPS capsule Take 0.4 mg by mouth daily. 04/22/20   [provider]  thiamine (VITAMIN B-1) 50 MG tablet Take 1 tablet (50 mg total) by mouth daily. Patient not taking: Reported on 05/13/2020 03/12/20   Janith Lima, MD  Tiotropium Bromide Monohydrate (SPIRIVA RESPIMAT) 2.5 MCG/ACT AERS Inhale 2 puffs into the lungs daily. 12/25/19   Martyn Ehrich, NP    Allergies    Patient has no known allergies.  Review of Systems   Review of Systems  Constitutional: Positive for chills and diaphoresis. Negative for fever.  HENT: Negative for ear pain and sore throat.   Eyes: Negative for visual disturbance.  Respiratory: Positive for shortness of breath. Negative for cough.   Cardiovascular: Positive for chest pain (resolved) and leg swelling.  Gastrointestinal: Positive for abdominal pain, nausea and vomiting. Negative for constipation and diarrhea.  Genitourinary: Negative for dysuria and hematuria.  Musculoskeletal: Negative for back pain.  Skin: Negative for rash.  Neurological: Negative for  seizures and syncope.  All other systems reviewed and are negative.   Physical Exam Updated Vital Signs BP 118/60   Pulse 69   Temp 98.5 F (36.9 C) (Oral)   Resp 20   Wt 100.5 kg   SpO2 95%   BMI 36.88 kg/m   Physical Exam Vitals and nursing note reviewed.  Constitutional:      General: She is not in acute distress.    Appearance: She is well-developed.  HENT:     Head: Normocephalic and atraumatic.  Eyes:     Conjunctiva/sclera: Conjunctivae normal.  Cardiovascular:     Rate and Rhythm: Normal rate and regular rhythm.     Comments: Bibasilar rates Pulmonary:     Effort: Pulmonary effort is normal. No respiratory distress.     Breath sounds: Normal breath sounds.  Abdominal:     General: Bowel sounds are normal.     Palpations: Abdomen is soft.     Tenderness: There is abdominal tenderness (mild luq and epigastric ttp).  Musculoskeletal:     Cervical back: Neck supple.     Comments: Trace ble edema  Skin:    General: Skin is warm and dry.  Neurological:     Mental Status: She is alert.     ED Results / Procedures / Treatments   Labs (all labs ordered are listed, but only abnormal results are displayed) Labs Reviewed  COMPREHENSIVE METABOLIC PANEL - Abnormal; Notable for the following components:      Result Value   Potassium 3.1 (*)    Glucose, Bld 131 (*)    Calcium 8.3 (*)    Albumin 3.0 (*)    AST 134 (*)    Alkaline Phosphatase 153 (*)    Total Bilirubin 4.7 (*)    All other components within normal limits  CBC WITH DIFFERENTIAL/PLATELET - Abnormal; Notable for the following components:   RBC 2.58 (*)    Hemoglobin 9.3 (*)  HCT 28.0 (*)    MCV 108.5 (*)    MCH 36.0 (*)    RDW 16.8 (*)    All other components within normal limits  RESP PANEL BY RT-PCR (FLU A&B, COVID) ARPGX2  LIPASE, BLOOD  URINALYSIS, ROUTINE W REFLEX MICROSCOPIC  BRAIN NATRIURETIC PEPTIDE  TROPONIN I (HIGH SENSITIVITY)  TROPONIN I (HIGH SENSITIVITY)    EKG EKG  Interpretation  Date/Time:  _55  year old female with a history of cirrhosis, CHF, presenting the emergency department today for evaluation of nausea and vomiting.  Reviewed/interpreted labs CBC at baseline CMP also at baseline Lipase negative Trop neg  At shift change, pending repeat trop, bnp, ct abd/pelvis. Care transitioned to Dr. Roslynn Amble to f/u on imaging and  labs.   Final Clinical Impression(s) / ED Diagnoses Final diagnoses:  None    Rx / DC Orders ED Discharge Orders    None       Bishop Dublin 05/25/20 1754    Lucrezia Starch, MD 05/25/20 2117

## 2020-05-25 NOTE — ED Notes (Signed)
Pt O2 on 2L [place] dropped from 97% to 91% upon standing.

## 2020-05-25 NOTE — ED Notes (Signed)
Pt said when she tries to use the bathroom she only trickles out urine

## 2020-05-25 NOTE — ED Provider Notes (Deleted)
Patient placed in Quick Look pathway, seen and evaluated   Chief Complaint: NV   HPI:   60 y/o F presenting for eval of NV. She further reports left sided abd pain and periumbilical pain. Reports chills, sweats but no fevers. She also reports some left sided chest pain and ble swelling. Supposed to be on 2L [place]. Hypoxic on 2L, increased to 4L and sats at 95%  ROS: NV, abd pain, sweats, chills (one)  Physical Exam:   Gen: No distress  Neuro: Awake and Alert  Skin: Warm    Focused Exam: LUQ TTP. Trace ble edema. Bibasilar rales.   Initiation of care has begun. The patient has been counseled on the process, plan, and necessity for staying for the completion/evaluation, and the remainder of the medical screening examination  MSE was initiated and I personally evaluated the patient and placed orders (if any) at  3:51 PM on May 25, 2020.  Informed nursing staff that pt is hypoxic on her home O2 and will need to be prioritized for a room.     Rodney Booze, PA-C 05/25/20 1552    Octavion Mollenkopf S, PA-C 05/25/20 1556    Dayan Kreis S, PA-C 05/25/20 1600

## 2020-05-26 ENCOUNTER — Telehealth: Payer: Self-pay | Admitting: Family

## 2020-05-26 ENCOUNTER — Ambulatory Visit: Admit: 2020-05-26 | Payer: PPO | Admitting: Orthopedic Surgery

## 2020-05-26 SURGERY — ARTHROPLASTY, KNEE, TOTAL
Anesthesia: Choice | Site: Knee | Laterality: Left

## 2020-05-26 NOTE — Telephone Encounter (Signed)
[facility] FYI  Caller was discharge from the hospital on 04/35for kidney and heart failure and now her stomach is extended/bloating and yellow vomit - she's not able to eat much   Advised to go to ED now. Patient understood and agreed.

## 2020-05-27 ENCOUNTER — Telehealth: Payer: Self-pay | Admitting: Family

## 2020-05-27 NOTE — Telephone Encounter (Signed)
Patient is experiencing SOB and weakness. O2 stat dropped down to 66% and went back up to the 90s. Transferred to [facility].   Caller stated she has pneumonia and only has 2 antibiotic doses left. Currently on oxygen. She tried to sleep propped up but at some point turned over and was laying flat. Her oxygen sat dropped to 66% while sleeping. States she woke up and felt like she was dying. She went to the ER yesterday and states her exam came back normal (labs). Did not have ABG yesterday. She has had a new oxygen tank delivered. Oxygen is set between 2.5 or 3 l/[place]. Currently states her sat is in the 90's. Kidney failure; liver; heart (CHF); pneumonia bilateral.   Advised to go to ED. Patient refused and preferred to be seen by PCP instead.   Patient was tentatively scheduled for tomorrow. Please advise.

## 2020-05-27 NOTE — Telephone Encounter (Signed)
I have called pt to ask her how she was feeling and what type of outcome you want from the visit for [facility] tomorrow. Pt reports that she was told that she needed the [facility] f/u and just following orders. Pt feels 10x better right now and her current O2 stat is 93-95% on 2.5 L of oxygen.   Pt stated it was stuck in her bed this am and her O2 has never gone down as low as the 60s. Pt will be sleeping her hospital bed tonight and she has lots of follow up speciality appointments set up.   Please advise if it is necessary to keep the [facility] F/u visit in the AM. Pt reports that she doesn't want to lug around the equipment with her if she doesn't have to.

## 2020-05-28 ENCOUNTER — Ambulatory Visit: Payer: PPO | Admitting: Family

## 2020-05-28 ENCOUNTER — Telehealth: Payer: Self-pay | Admitting: Emergency Medicine

## 2020-05-28 NOTE — Telephone Encounter (Signed)
I'm sorry but it is difficult for me to recommend any changes without having seen her. I agree that she needs to be seen here asap.

## 2020-05-28 NOTE — Telephone Encounter (Signed)
I would recommend that she plan to keep the follow-up today; if she doesn't have transportation, it would be okay to re-schedule for later in the week if that is better for her.

## 2020-05-28 NOTE — Telephone Encounter (Signed)
I have called pt back to confirm appointment today. Pt reports that she is just now getting her morning started and will not make it in the office today. She does want to make an appointment for later on this week; however, she is not ready to do so now. She stated that she will call back to reschedule.

## 2020-05-28 NOTE — Telephone Encounter (Signed)
Primary Pulmonologist: Dr. [name] Last office visit and with whom: 12/24/20  Geraldo Pitter NP What do we see them for (pulmona/ry problems): COPD Last OV assessment/plan:  Assessment & Plan:   I suspect patient has degree of mild COPD d/t clinical symptoms, smoking history and improvement with addition of LAMA. Pulmonary function testing does not show any restriction or obstruction but this was after she took Spiriva inhaler. Her respiratory status appears stable and can continue pain management per Dr. Primus Bravo. Congratulated her on quitting smoking!  COPD, mild ([place]) - Clinically improved with additional of LAMA, quit smoking October 2021 - 12/24/19- FEV1 2.82(104%), ratio 81 p spiriva  - Continue Spiriva Respimat 2.52mcg two puffs daily (RX sent) - We will fax letter to Dr. Primus Bravo office  - Received influenza vaccine today  - Follow-up in 6 months with Dr. [name] or sooner if symptoms exacerbate     Was appointment offered to patient (explain)?  05/30/20 at 4:30 pm with Rexene Edison NP.  Patient requested to see TP.   Reason for call: Recent hospitalization 05/12/20-05/23/20.  She is currently on oxygen 24/7 at 3.5 L, states when she walks from the bed to the bathroom her sats drop to 83%.  At rest her sats are 87% on 3.5 L of oxygen.  She has a productive cough with yellow mucous that is sticky.  She has a couple days of antibiotic left.  She was seen in the ED on 05/25/20 and sent home.  The cxr showed multifocal pna.  Dr. [name], I scheduled her from Friday 05/30/20 at 4:30 pm to see Rexene Edison NP.  Is there anything else you advise in the meantime?  (examples of things to ask: : When did symptoms start? Fever? Cough? Productive? Color to sputum? More sputum than usual? Wheezing? Have you needed increased oxygen? Are you taking your respiratory medications? What over the counter measures have you tried?)  No Known Allergies  Immunization History  Administered Date(s) Administered  . Hepatitis  A, Adult 10/02/2018  . Influenza, Quadrivalent, Recombinant, Inj, Pf 11/15/2018  . Influenza,inj,Quad PF,6+ Mos 12/25/2019  . Influenza,inj,quad, With Preservative 11/23/2018  . PFIZER(Purple Top)SARS-COV-2 Vaccination 06/29/2019, 07/20/2019, 10/24/2019  . Pneumococcal Polysaccharide-23 10/02/2018  . Pneumococcal-Unspecified 11/23/2018  . Zoster 11/23/2018  . Zoster Recombinat (Shingrix) 10/02/2018, 12/05/2018

## 2020-05-28 NOTE — Telephone Encounter (Signed)
Left message for patient to call back  

## 2020-05-28 NOTE — Telephone Encounter (Signed)
Patient is returning phone call. Patient phone number is [phone].

## 2020-05-28 NOTE — Telephone Encounter (Signed)
Pt stated that they were in the hospital for 11 days went in on the (21st-1st of March-April); she has congestive heart failure, kidney failure, and liver failure on the same day. Stated negative for Covid and had pneumonia and stated that she can barely walk to her bathroom without her SpO2 will go to a 95 to a 75 with O2 on and she stated that she has two antibiotics left but believes she is suffering with more internal issues that the antibiotic will not help. Pt wanted to speak w/ Dr. [name] for advice states that she does want to arrange an appt but it will be very hard for her to get to the office and stated that she can barely breathe. [place] regard [phone]

## 2020-05-28 NOTE — Telephone Encounter (Signed)
Called and spoke with pt letting her know the info stated by RB and stated to her to make sure she keeps her appt as scheduled with our office. Pt verbalized understanding. Nothing further needed.

## 2020-05-29 ENCOUNTER — Inpatient Hospital Stay (HOSPITAL_COMMUNITY)
Admission: EM | Admit: 2020-05-29 | Discharge: 2020-06-22 | DRG: 870 | Disposition: E | Payer: PPO | Attending: Pulmonary Disease | Admitting: Pulmonary Disease

## 2020-05-29 ENCOUNTER — Encounter (HOSPITAL_COMMUNITY): Payer: Self-pay | Admitting: Emergency Medicine

## 2020-05-29 ENCOUNTER — Other Ambulatory Visit: Payer: Self-pay

## 2020-05-29 ENCOUNTER — Emergency Department (HOSPITAL_COMMUNITY): Payer: PPO

## 2020-05-29 DIAGNOSIS — D649 Anemia, unspecified: Secondary | ICD-10-CM | POA: Diagnosis present

## 2020-05-29 DIAGNOSIS — R918 Other nonspecific abnormal finding of lung field: Secondary | ICD-10-CM

## 2020-05-29 DIAGNOSIS — N189 Chronic kidney disease, unspecified: Secondary | ICD-10-CM | POA: Diagnosis present

## 2020-05-29 DIAGNOSIS — K7469 Other cirrhosis of liver: Secondary | ICD-10-CM

## 2020-05-29 DIAGNOSIS — J189 Pneumonia, unspecified organism: Secondary | ICD-10-CM | POA: Diagnosis present

## 2020-05-29 DIAGNOSIS — J849 Interstitial pulmonary disease, unspecified: Secondary | ICD-10-CM | POA: Diagnosis not present

## 2020-05-29 DIAGNOSIS — J440 Chronic obstructive pulmonary disease with acute lower respiratory infection: Secondary | ICD-10-CM | POA: Diagnosis present

## 2020-05-29 DIAGNOSIS — Z79899 Other long term (current) drug therapy: Secondary | ICD-10-CM | POA: Diagnosis not present

## 2020-05-29 DIAGNOSIS — R1013 Epigastric pain: Secondary | ICD-10-CM

## 2020-05-29 DIAGNOSIS — R9431 Abnormal electrocardiogram [ECG] [EKG]: Secondary | ICD-10-CM | POA: Diagnosis not present

## 2020-05-29 DIAGNOSIS — N179 Acute kidney failure, unspecified: Secondary | ICD-10-CM | POA: Diagnosis not present

## 2020-05-29 DIAGNOSIS — E785 Hyperlipidemia, unspecified: Secondary | ICD-10-CM | POA: Diagnosis present

## 2020-05-29 DIAGNOSIS — J9690 Respiratory failure, unspecified, unspecified whether with hypoxia or hypercapnia: Secondary | ICD-10-CM

## 2020-05-29 DIAGNOSIS — R739 Hyperglycemia, unspecified: Secondary | ICD-10-CM | POA: Diagnosis present

## 2020-05-29 DIAGNOSIS — Z87891 Personal history of nicotine dependence: Secondary | ICD-10-CM | POA: Diagnosis not present

## 2020-05-29 DIAGNOSIS — Z833 Family history of diabetes mellitus: Secondary | ICD-10-CM

## 2020-05-29 DIAGNOSIS — J9601 Acute respiratory failure with hypoxia: Secondary | ICD-10-CM

## 2020-05-29 DIAGNOSIS — K7031 Alcoholic cirrhosis of liver with ascites: Secondary | ICD-10-CM | POA: Diagnosis not present

## 2020-05-29 DIAGNOSIS — R451 Restlessness and agitation: Secondary | ICD-10-CM | POA: Diagnosis not present

## 2020-05-29 DIAGNOSIS — F1020 Alcohol dependence, uncomplicated: Secondary | ICD-10-CM

## 2020-05-29 DIAGNOSIS — F319 Bipolar disorder, unspecified: Secondary | ICD-10-CM | POA: Diagnosis present

## 2020-05-29 DIAGNOSIS — Z978 Presence of other specified devices: Secondary | ICD-10-CM

## 2020-05-29 DIAGNOSIS — J811 Chronic pulmonary edema: Secondary | ICD-10-CM | POA: Diagnosis not present

## 2020-05-29 DIAGNOSIS — K767 Hepatorenal syndrome: Secondary | ICD-10-CM | POA: Diagnosis present

## 2020-05-29 DIAGNOSIS — Z452 Encounter for adjustment and management of vascular access device: Secondary | ICD-10-CM

## 2020-05-29 DIAGNOSIS — Z66 Do not resuscitate: Secondary | ICD-10-CM | POA: Diagnosis present

## 2020-05-29 DIAGNOSIS — A419 Sepsis, unspecified organism: Principal | ICD-10-CM | POA: Diagnosis present

## 2020-05-29 DIAGNOSIS — Z515 Encounter for palliative care: Secondary | ICD-10-CM | POA: Diagnosis not present

## 2020-05-29 DIAGNOSIS — R0902 Hypoxemia: Secondary | ICD-10-CM | POA: Diagnosis not present

## 2020-05-29 DIAGNOSIS — J9621 Acute and chronic respiratory failure with hypoxia: Secondary | ICD-10-CM | POA: Diagnosis not present

## 2020-05-29 DIAGNOSIS — F419 Anxiety disorder, unspecified: Secondary | ICD-10-CM | POA: Diagnosis present

## 2020-05-29 DIAGNOSIS — K7030 Alcoholic cirrhosis of liver without ascites: Secondary | ICD-10-CM

## 2020-05-29 DIAGNOSIS — J80 Acute respiratory distress syndrome: Secondary | ICD-10-CM | POA: Diagnosis present

## 2020-05-29 DIAGNOSIS — Z4659 Encounter for fitting and adjustment of other gastrointestinal appliance and device: Secondary | ICD-10-CM

## 2020-05-29 DIAGNOSIS — F1010 Alcohol abuse, uncomplicated: Secondary | ICD-10-CM | POA: Diagnosis not present

## 2020-05-29 DIAGNOSIS — Z89021 Acquired absence of right finger(s): Secondary | ICD-10-CM | POA: Diagnosis not present

## 2020-05-29 DIAGNOSIS — R112 Nausea with vomiting, unspecified: Secondary | ICD-10-CM

## 2020-05-29 DIAGNOSIS — K7460 Unspecified cirrhosis of liver: Secondary | ICD-10-CM | POA: Diagnosis not present

## 2020-05-29 DIAGNOSIS — R6521 Severe sepsis with septic shock: Secondary | ICD-10-CM | POA: Diagnosis not present

## 2020-05-29 DIAGNOSIS — I5031 Acute diastolic (congestive) heart failure: Secondary | ICD-10-CM | POA: Diagnosis present

## 2020-05-29 DIAGNOSIS — I509 Heart failure, unspecified: Secondary | ICD-10-CM | POA: Diagnosis not present

## 2020-05-29 DIAGNOSIS — K219 Gastro-esophageal reflux disease without esophagitis: Secondary | ICD-10-CM | POA: Diagnosis present

## 2020-05-29 DIAGNOSIS — Z20822 Contact with and (suspected) exposure to covid-19: Secondary | ICD-10-CM | POA: Diagnosis not present

## 2020-05-29 DIAGNOSIS — I130 Hypertensive heart and chronic kidney disease with heart failure and stage 1 through stage 4 chronic kidney disease, or unspecified chronic kidney disease: Secondary | ICD-10-CM | POA: Diagnosis present

## 2020-05-29 DIAGNOSIS — K3189 Other diseases of stomach and duodenum: Secondary | ICD-10-CM | POA: Diagnosis not present

## 2020-05-29 DIAGNOSIS — I10 Essential (primary) hypertension: Secondary | ICD-10-CM | POA: Diagnosis not present

## 2020-05-29 DIAGNOSIS — D638 Anemia in other chronic diseases classified elsewhere: Secondary | ICD-10-CM | POA: Diagnosis not present

## 2020-05-29 DIAGNOSIS — R410 Disorientation, unspecified: Secondary | ICD-10-CM | POA: Diagnosis not present

## 2020-05-29 DIAGNOSIS — R0689 Other abnormalities of breathing: Secondary | ICD-10-CM | POA: Diagnosis not present

## 2020-05-29 DIAGNOSIS — Z4682 Encounter for fitting and adjustment of non-vascular catheter: Secondary | ICD-10-CM | POA: Diagnosis not present

## 2020-05-29 DIAGNOSIS — H35033 Hypertensive retinopathy, bilateral: Secondary | ICD-10-CM | POA: Diagnosis not present

## 2020-05-29 DIAGNOSIS — R0602 Shortness of breath: Secondary | ICD-10-CM | POA: Diagnosis not present

## 2020-05-29 DIAGNOSIS — E782 Mixed hyperlipidemia: Secondary | ICD-10-CM | POA: Diagnosis not present

## 2020-05-29 DIAGNOSIS — R6510 Systemic inflammatory response syndrome (SIRS) of non-infectious origin without acute organ dysfunction: Secondary | ICD-10-CM | POA: Diagnosis not present

## 2020-05-29 DIAGNOSIS — I248 Other forms of acute ischemic heart disease: Secondary | ICD-10-CM | POA: Diagnosis present

## 2020-05-29 DIAGNOSIS — I129 Hypertensive chronic kidney disease with stage 1 through stage 4 chronic kidney disease, or unspecified chronic kidney disease: Secondary | ICD-10-CM | POA: Diagnosis present

## 2020-05-29 DIAGNOSIS — R911 Solitary pulmonary nodule: Secondary | ICD-10-CM | POA: Diagnosis present

## 2020-05-29 DIAGNOSIS — Z8249 Family history of ischemic heart disease and other diseases of the circulatory system: Secondary | ICD-10-CM | POA: Diagnosis not present

## 2020-05-29 DIAGNOSIS — J9600 Acute respiratory failure, unspecified whether with hypoxia or hypercapnia: Secondary | ICD-10-CM

## 2020-05-29 DIAGNOSIS — E876 Hypokalemia: Secondary | ICD-10-CM | POA: Diagnosis not present

## 2020-05-29 LAB — I-STAT CHEM 8, ED
BUN: 11 mg/dL (ref 6–20)
Calcium, Ion: 1.06 mmol/L — ABNORMAL LOW (ref 1.15–1.40)
Chloride: 102 mmol/L (ref 98–111)
Creatinine, Ser: 0.7 mg/dL (ref 0.44–1.00)
Glucose, Bld: 170 mg/dL — ABNORMAL HIGH (ref 70–99)
HCT: 33 % — ABNORMAL LOW (ref 36.0–46.0)
Hemoglobin: 11.2 g/dL — ABNORMAL LOW (ref 12.0–15.0)
Potassium: 3.9 mmol/L (ref 3.5–5.1)
Sodium: 141 mmol/L (ref 135–145)
TCO2: 25 mmol/L (ref 22–32)

## 2020-05-29 LAB — CBC
HCT: 30 % — ABNORMAL LOW (ref 36.0–46.0)
Hemoglobin: 9.9 g/dL — ABNORMAL LOW (ref 12.0–15.0)
MCH: 35.5 pg — ABNORMAL HIGH (ref 26.0–34.0)
MCHC: 33 g/dL (ref 30.0–36.0)
MCV: 107.5 fL — ABNORMAL HIGH (ref 80.0–100.0)
Platelets: 387 10*3/uL (ref 150–400)
RBC: 2.79 MIL/uL — ABNORMAL LOW (ref 3.87–5.11)
RDW: 15.9 % — ABNORMAL HIGH (ref 11.5–15.5)
WBC: 16.5 10*3/uL — ABNORMAL HIGH (ref 4.0–10.5)
nRBC: 0 % (ref 0.0–0.2)

## 2020-05-29 LAB — COMPREHENSIVE METABOLIC PANEL
ALT: 43 U/L (ref 0–44)
AST: 146 U/L — ABNORMAL HIGH (ref 15–41)
Albumin: 2.8 g/dL — ABNORMAL LOW (ref 3.5–5.0)
Alkaline Phosphatase: 147 U/L — ABNORMAL HIGH (ref 38–126)
Anion gap: 13 (ref 5–15)
BUN: 12 mg/dL (ref 6–20)
CO2: 23 mmol/L (ref 22–32)
Calcium: 8.4 mg/dL — ABNORMAL LOW (ref 8.9–10.3)
Chloride: 105 mmol/L (ref 98–111)
Creatinine, Ser: 0.77 mg/dL (ref 0.44–1.00)
GFR, Estimated: >60 mL/min (ref >60)
Glucose, Bld: 177 mg/dL — ABNORMAL HIGH (ref 70–99)
Potassium: 3.8 mmol/L (ref 3.5–5.1)
Sodium: 141 mmol/L (ref 135–145)
Total Bilirubin: 4.2 mg/dL — ABNORMAL HIGH (ref 0.3–1.2)
Total Protein: 6.8 g/dL (ref 6.5–8.1)

## 2020-05-29 LAB — MRSA PCR SCREENING: MRSA by PCR: NEGATIVE

## 2020-05-29 LAB — TROPONIN I (HIGH SENSITIVITY)
Troponin I (High Sensitivity): 18 ng/L — ABNORMAL HIGH (ref <18)
Troponin I (High Sensitivity): 18 ng/L — ABNORMAL HIGH (ref <18)

## 2020-05-29 LAB — PROTIME-INR
INR: 1.6 — ABNORMAL HIGH (ref 0.8–1.2)
Prothrombin Time: 18.8 seconds — ABNORMAL HIGH (ref 11.4–15.2)

## 2020-05-29 LAB — RESP PANEL BY RT-PCR (FLU A&B, COVID) ARPGX2
Influenza A by PCR: NEGATIVE
Influenza B by PCR: NEGATIVE
SARS Coronavirus 2 by RT PCR: NEGATIVE

## 2020-05-29 LAB — BRAIN NATRIURETIC PEPTIDE: B Natriuretic Peptide: 366 pg/mL — ABNORMAL HIGH (ref 0.0–100.0)

## 2020-05-29 LAB — LIPASE, BLOOD: Lipase: 22 U/L (ref 11–51)

## 2020-05-29 LAB — AMMONIA: Ammonia: 35 umol/L (ref 9–35)

## 2020-05-29 MED ORDER — POLYETHYLENE GLYCOL 3350 17 G PO PACK: 17.0000 g | PACK | Freq: Every day | ORAL | Status: DC | PRN

## 2020-05-29 MED ORDER — PREDNISONE 20 MG PO TABS: 40.0000 mg | ORAL_TABLET | Freq: Every day | ORAL | Status: DC

## 2020-05-29 MED ORDER — THIAMINE HCL 100 MG PO TABS
100.0000 mg | ORAL_TABLET | Freq: Every day | ORAL | Status: DC
  Administered 2020-05-29: 100 mg via ORAL
  Filled 2020-05-29: qty 1

## 2020-05-29 MED ORDER — FLUOXETINE HCL 20 MG PO CAPS
20.0000 mg | ORAL_CAPSULE | Freq: Two times a day (BID) | ORAL | Status: DC
  Administered 2020-05-29: 20 mg via ORAL
  Filled 2020-05-29 (×2): qty 1

## 2020-05-29 MED ORDER — THIAMINE HCL 100 MG/ML IJ SOLN: 100.0000 mg | Freq: Every day | INTRAMUSCULAR | Status: DC

## 2020-05-29 MED ORDER — SODIUM CHLORIDE 0.9 % IV SOLN: 500.0000 mg | INTRAVENOUS | Status: DC

## 2020-05-29 MED ORDER — ENOXAPARIN SODIUM 40 MG/0.4ML ~~LOC~~ SOLN
40.0000 mg | SUBCUTANEOUS | Status: DC
  Administered 2020-05-29 – 2020-06-02 (×5): 40 mg via SUBCUTANEOUS
  Filled 2020-05-29 (×5): qty 0.4

## 2020-05-29 MED ORDER — SODIUM CHLORIDE 0.9 % IV SOLN
500.0000 mg | Freq: Once | INTRAVENOUS | Status: AC
  Administered 2020-05-29: 500 mg via INTRAVENOUS
  Filled 2020-05-29: qty 500

## 2020-05-29 MED ORDER — CHLORHEXIDINE GLUCONATE CLOTH 2 % EX PADS
6.0000 | MEDICATED_PAD | Freq: Every day | CUTANEOUS | Status: DC
  Administered 2020-05-31 – 2020-06-03 (×4): 6 via TOPICAL

## 2020-05-29 MED ORDER — TIOTROPIUM BROMIDE MONOHYDRATE 2.5 MCG/ACT IN AERS: 2.0000 | INHALATION_SPRAY | Freq: Every day | RESPIRATORY_TRACT | Status: DC

## 2020-05-29 MED ORDER — ADULT MULTIVITAMIN W/MINERALS CH
1.0000 | ORAL_TABLET | Freq: Every day | ORAL | Status: DC
  Administered 2020-05-29: 1 via ORAL
  Filled 2020-05-29: qty 1

## 2020-05-29 MED ORDER — UMECLIDINIUM BROMIDE 62.5 MCG/INH IN AEPB
1.0000 | INHALATION_SPRAY | Freq: Every day | RESPIRATORY_TRACT | Status: DC
  Filled 2020-05-29: qty 7

## 2020-05-29 MED ORDER — ATORVASTATIN CALCIUM 40 MG PO TABS: 40.0000 mg | ORAL_TABLET | Freq: Every day | ORAL | Status: DC

## 2020-05-29 MED ORDER — FOLIC ACID 1 MG PO TABS
1.0000 mg | ORAL_TABLET | Freq: Every day | ORAL | Status: DC
  Administered 2020-05-29: 1 mg via ORAL
  Filled 2020-05-29: qty 1

## 2020-05-29 MED ORDER — SODIUM CHLORIDE 0.9 % IV SOLN
2.0000 g | Freq: Once | INTRAVENOUS | Status: AC
  Administered 2020-05-29: 2 g via INTRAVENOUS
  Filled 2020-05-29: qty 2

## 2020-05-29 MED ORDER — LORAZEPAM 1 MG PO TABS: 1.0000 mg | ORAL_TABLET | ORAL | Status: DC | PRN

## 2020-05-29 MED ORDER — ALBUTEROL SULFATE HFA 108 (90 BASE) MCG/ACT IN AERS
4.0000 | INHALATION_SPRAY | Freq: Once | RESPIRATORY_TRACT | Status: AC
  Administered 2020-05-29: 4 via RESPIRATORY_TRACT
  Filled 2020-05-29: qty 6.7

## 2020-05-29 MED ORDER — ONDANSETRON HCL 4 MG/2ML IJ SOLN
4.0000 mg | Freq: Once | INTRAMUSCULAR | Status: DC
  Filled 2020-05-29: qty 2

## 2020-05-29 MED ORDER — ACETAMINOPHEN 650 MG RE SUPP: 650.0000 mg | Freq: Four times a day (QID) | RECTAL | Status: DC | PRN

## 2020-05-29 MED ORDER — IPRATROPIUM BROMIDE 0.02 % IN SOLN
0.5000 mg | Freq: Once | RESPIRATORY_TRACT | Status: AC
  Administered 2020-05-29: 0.5 mg via RESPIRATORY_TRACT
  Filled 2020-05-29: qty 2.5

## 2020-05-29 MED ORDER — DOXYCYCLINE HYCLATE 100 MG PO TABS
100.0000 mg | ORAL_TABLET | Freq: Two times a day (BID) | ORAL | Status: DC
  Administered 2020-05-29: 100 mg via ORAL
  Filled 2020-05-29: qty 1

## 2020-05-29 MED ORDER — VANCOMYCIN HCL 1500 MG/300ML IV SOLN: 1500.0000 mg | INTRAVENOUS | Status: DC

## 2020-05-29 MED ORDER — FUROSEMIDE 10 MG/ML IJ SOLN
40.0000 mg | Freq: Two times a day (BID) | INTRAMUSCULAR | Status: DC
  Administered 2020-05-29 – 2020-05-30 (×2): 40 mg via INTRAVENOUS
  Filled 2020-05-29 (×2): qty 4

## 2020-05-29 MED ORDER — LORAZEPAM 2 MG/ML IJ SOLN
1.0000 mg | INTRAMUSCULAR | Status: DC | PRN
Start: 2020-05-29 — End: 2020-05-31
  Administered 2020-05-30: 2 mg via INTRAVENOUS
  Filled 2020-05-29 (×2): qty 1

## 2020-05-29 MED ORDER — ACETAMINOPHEN 325 MG PO TABS
650.0000 mg | ORAL_TABLET | Freq: Four times a day (QID) | ORAL | Status: DC | PRN
  Filled 2020-05-29: qty 2

## 2020-05-29 MED ORDER — SODIUM CHLORIDE 0.9 % IV SOLN: 2.0000 g | Freq: Three times a day (TID) | INTRAVENOUS | Status: DC

## 2020-05-29 MED ORDER — ASPIRIN EC 81 MG PO TBEC
81.0000 mg | DELAYED_RELEASE_TABLET | Freq: Every day | ORAL | Status: DC
  Administered 2020-05-29: 81 mg via ORAL
  Filled 2020-05-29 (×2): qty 1

## 2020-05-29 MED ORDER — HYDROCODONE-ACETAMINOPHEN 5-325 MG PO TABS
1.0000 | ORAL_TABLET | ORAL | Status: DC | PRN
  Administered 2020-05-29: 2 via ORAL
  Administered 2020-05-29: 1 via ORAL
  Administered 2020-05-30: 2 via ORAL
  Filled 2020-05-29 (×2): qty 2
  Filled 2020-05-29: qty 1

## 2020-05-29 MED ORDER — ALBUTEROL SULFATE (2.5 MG/3ML) 0.083% IN NEBU
5.0000 mg | INHALATION_SOLUTION | Freq: Once | RESPIRATORY_TRACT | Status: AC
  Administered 2020-05-29: 5 mg via RESPIRATORY_TRACT
  Filled 2020-05-29: qty 6

## 2020-05-29 MED ORDER — MONTELUKAST SODIUM 10 MG PO TABS
10.0000 mg | ORAL_TABLET | Freq: Every day | ORAL | Status: DC
  Administered 2020-05-29 – 2020-05-30 (×2): 10 mg via ORAL
  Filled 2020-05-29 (×2): qty 1

## 2020-05-29 MED ORDER — LACTULOSE 10 GM/15ML PO SOLN
30.0000 g | Freq: Two times a day (BID) | ORAL | Status: DC
  Filled 2020-05-29: qty 45

## 2020-05-29 MED ORDER — FUROSEMIDE 10 MG/ML IJ SOLN
40.0000 mg | Freq: Once | INTRAMUSCULAR | Status: AC
  Administered 2020-05-29: 40 mg via INTRAVENOUS
  Filled 2020-05-29: qty 4

## 2020-05-29 MED ORDER — LAMOTRIGINE 100 MG PO TABS
100.0000 mg | ORAL_TABLET | Freq: Every day | ORAL | Status: DC
  Administered 2020-05-29 – 2020-05-30 (×2): 100 mg via ORAL
  Filled 2020-05-29 (×2): qty 1

## 2020-05-29 MED ORDER — IPRATROPIUM BROMIDE HFA 17 MCG/ACT IN AERS
2.0000 | INHALATION_SPRAY | Freq: Once | RESPIRATORY_TRACT | Status: AC
  Administered 2020-05-29: 2 via RESPIRATORY_TRACT
  Filled 2020-05-29: qty 12.9

## 2020-05-29 MED ORDER — ARIPIPRAZOLE 10 MG PO TABS
30.0000 mg | ORAL_TABLET | Freq: Every day | ORAL | Status: DC
  Administered 2020-05-29: 30 mg via ORAL
  Filled 2020-05-29 (×2): qty 3

## 2020-05-29 MED ORDER — VANCOMYCIN HCL 2000 MG/400ML IV SOLN
2000.0000 mg | Freq: Once | INTRAVENOUS | Status: DC
  Administered 2020-05-29: 2000 mg via INTRAVENOUS
  Filled 2020-05-29: qty 400

## 2020-05-29 MED ORDER — ALBUTEROL SULFATE HFA 108 (90 BASE) MCG/ACT IN AERS: 2.0000 | INHALATION_SPRAY | Freq: Four times a day (QID) | RESPIRATORY_TRACT | Status: DC | PRN

## 2020-05-29 MED ORDER — PANTOPRAZOLE SODIUM 40 MG PO TBEC
40.0000 mg | DELAYED_RELEASE_TABLET | Freq: Every day | ORAL | Status: DC
  Administered 2020-05-29: 40 mg via ORAL
  Filled 2020-05-29: qty 1

## 2020-05-29 NOTE — Progress Notes (Signed)
Pt has voided, see flowsheet.

## 2020-05-29 NOTE — ED Provider Notes (Addendum)
[facility] DEPT Provider Note   CSN: [id number] Arrival date & time: 06/20/2020  1135     History Chief Complaint  Patient presents with  . Shortness of Breath  . Bloated    [name] [name] is a 60 y.o. female.  Patient with hx cirrhosis, copd, home o2, recent hospitalization for pna/resp difficulty, presents indicating since d/c from hospital progressive sob, persistent cough occasionally productive of thick sputum.  Symptoms gradual onset in past couple weeks, mod-severe, constant, persistent, worsening. Pt has increased home o2 from 2 liters to 4. Today called EMS and o2 sats in 60s. Denies sore throat or runny nose. Felt warm but no fevers/chills. Denies chest pain. No pleuritic pain. Denies leg pain/swelling. Has not taken lasix in past day. Sob worse w exertion, coughing and lying flat. Former etoh use w resultant cirrhosis, no current or recent etoh use. +vapes. Pt unsure if any recent wt loss/gain.   The history is provided by the patient and the EMS personnel.  Shortness of Breath Associated symptoms: cough and wheezing   Associated symptoms: no abdominal pain, no chest pain, no fever, no headaches, no neck pain, no rash, no sore throat and no vomiting        Past Medical History:  Diagnosis Date  . ABDOMINAL PAIN -GENERALIZED 09/27/2008   Qualifier: Diagnosis of  By: Trellis Paganini PA-c, Amy S   . Acute epigastric pain 04/27/2011  . Acute pancreatitis 08/12/2012  . Allergic rhinitis 09/05/2017  . Anal fistula   . Anemia   . Anxiety   . Arthritis   . BIPOLAR AFFECTIVE DISORDER 12/06/2006   Qualifier: History of  By: Elsworth Soho MD, Leanna Sato Bipolar disorder ([place])   . Chronic kidney disease   . COPD (chronic obstructive pulmonary disease) ([place]) 11/08/2017  . Cough 12/06/2006   Annotation: chronic since'96, nml spirometry, +ve methacholine challenge, nml  CT sinuses, inconclusive pH probe, esophageal manometry Centricity Description: SYMPTOM, COUGH  Qualifier: History of  By: Elsworth Soho MD, Leanna Sato   Centricity Description: COUGH Qualifier: Diagnosis of  By: Elsworth Soho MD, Leanna Sato    . Depressive disorder, not elsewhere classified   . Diarrhea 09/27/2008   Qualifier: Diagnosis of  By: Trellis Paganini PA-c, Amy S   . Diverticulosis of colon (without mention of hemorrhage)   . Elevated liver enzymes 10/31/2015  . Esophageal reflux   . ETOH abuse 08/14/2012  . Gastritis 04/28/2011  . GERD (gastroesophageal reflux disease)   . Hematochezia 08/14/2012  . Hemoptysis 05/26/2017  . HEMORRHOIDS 09/12/2007   Qualifier: Diagnosis of  By: Nils Pyle CMA ([place]), Mearl Latin    . Hepatic steatosis   . HIATAL HERNIA 09/12/2007   Qualifier: Diagnosis of  By: Nils Pyle CMA ([place]), Mearl Latin    . History of recurrent UTIs   . Hyperlipidemia   . Hypertension   . Hypertensive retinopathy of both eyes 11/24/2015  . INFECTIOUS DIARRHEA 09/27/2008   Qualifier: Diagnosis of  By: Laney Potash, [place]    . Iron deficiency   . Iron overload 09/22/2017  . Nausea alone 09/27/2008   Qualifier: Diagnosis of  By: Laney Potash, [place] 09/27/2008   Qualifier: Diagnosis of  By: Shane Crutch, Amy S   . Obesity, Class III, BMI 40-49.9 (morbid obesity) ([place]) 04/27/2011  . Personal history of colonic polyps 10/16/2009   hyperplastic  . Polyarthralgia 12/02/2015   Evaluated by Dr. Charlestine Night (rheumatologist) 12/29/2015:1): 1)possible fibromyalgia (Suggest physical therapy and flexeril),  2) Polyarthralgia/Insomnia (suggest use of robaxin or flexeril), 3) Nicotine Dependence (counseled to quit). 4)Depression (continue therapy with psychiatrist)  . Pre-diabetes   . Pseudocyst of pancreas 08/12/2012  . Pulmonary nodule, right 11/29/2016  . Rectal fissure 05/11/2011  . Rectal fistula 05/11/2011  . RECTAL PAIN 09/12/2007   Qualifier: Diagnosis of  By: Nils Pyle CMA ([place]), Mearl Latin    . Secondary hemochromatosis 09/22/2017  . Seizures ([place])   . SIRS (systemic inflammatory response syndrome) ([place]) 03/11/2013   . Solitary rectal ulcer 04/12/2011  . TOBACCO ABUSE 12/06/2006   Qualifier: History of  By: Elsworth Soho MD, Leanna Sato.    . Ulcerative proctitis, nonspecific ([place]) 03/10/2011  . Varicose veins of both lower extremities 12/02/2015    Patient Active Problem List   Diagnosis Date Noted  . Acute liver failure without hepatic coma   . Cirrhosis ([place])   . Alcoholic cirrhosis of liver with ascites ([place])   . Alcohol use disorder, severe, dependence ([place])   . Bilirubinemia   . Anasarca 05/13/2020  . Anemia due to acquired thiamine deficiency 03/12/2020  . Cushingoid facies 03/06/2020  . Deficiency anemia 03/06/2020  . Palpitations 01/03/2020  . Chest pain of uncertain etiology 89/16/9450  . Aortic atherosclerosis ([place]) 01/03/2020  . NAFLD (nonalcoholic fatty liver disease) 01/03/2020  . Pain in left knee 06/12/2019  . COPD, mild ([place]) 11/08/2017  . Iron overload 09/22/2017  . Secondary hemochromatosis 09/22/2017  . Allergic rhinitis 09/05/2017  . Hemoptysis 05/26/2017  . Pulmonary nodule, right 11/29/2016  . Polyarthralgia 12/02/2015  . Varicose veins of both lower extremities 12/02/2015  . Hypertensive retinopathy of both eyes 11/24/2015  . Elevated liver enzymes 10/31/2015  . Hyperglycemia 10/31/2015  . SIRS (systemic inflammatory response syndrome) ([place]) 03/11/2013  . Hypertension   . Hyperlipidemia   . Anemia   . Hematochezia 08/14/2012  . ETOH abuse 08/14/2012  . Pseudocyst of pancreas 08/12/2012  . GERD (gastroesophageal reflux disease) 06/08/2012  . Rectal fissure 05/11/2011  . Rectal fistula 05/11/2011  . Hiatal hernia 04/28/2011  . Gastritis 04/28/2011  . Nausea 04/27/2011  . Acute epigastric pain 04/27/2011  . Obesity, Class III, BMI 40-49.9 (morbid obesity) ([place]) 04/27/2011  . Solitary rectal ulcer 04/12/2011  . Hemorrhoids 03/10/2011  . Ulcerative proctitis, nonspecific ([place]) 03/10/2011  . DIVERTICULOSIS, COLON 10/22/2009  . COLONIC POLYPS, HYPERPLASTIC, HX OF  10/22/2009  . INFECTIOUS DIARRHEA 09/27/2008  . ULCERATIVE PROCTITIS 09/27/2008  . NAUSEA AND VOMITING 09/27/2008  . NAUSEA ALONE 09/27/2008  . DIARRHEA 09/27/2008  . ABDOMINAL PAIN -GENERALIZED 09/27/2008  . DEPRESSION 09/12/2007  . HEMORRHOIDS 09/12/2007  . Diaphragmatic hernia 09/12/2007  . ULCERATIVE COLITIS 09/12/2007  . RECTAL PAIN 09/12/2007  . BIPOLAR AFFECTIVE DISORDER 12/06/2006  . Current every day smoker 12/06/2006  . Esophageal reflux 12/06/2006  . Cough 12/06/2006    Past Surgical History:  Procedure Laterality Date  . FINGER AMPUTATION  2010   right index  . HEMORROIDECTOMY    . KNEE ARTHROSCOPY     bilateral, left x2  . TOTAL ABDOMINAL HYSTERECTOMY    . VIDEO BRONCHOSCOPY Bilateral 05/31/2017   Procedure: VIDEO BRONCHOSCOPY WITHOUT FLUORO;  Surgeon: Collene Gobble, MD;  Location: [facility];  Service: Cardiopulmonary;  Laterality: Bilateral;     OB History   No obstetric history on file.     Family History  Problem Relation Age of Onset  . Irritable bowel syndrome Mother   . Hypertension Mother   . Atrial fibrillation Mother   . Ovarian cancer Maternal  Aunt   . Diabetes Father   . Heart attack Father   . Hypertension Father   . Hyperlipidemia Father   . Thyroid disease Sister   . Hyperlipidemia Sister   . Thyroid disease Brother   . Colon polyps Brother   . Stomach cancer Neg Hx   . Colon cancer Neg Hx     Social History   Tobacco Use  . Smoking status: Former Smoker    Packs/day: 0.25    Years: 46.00    Pack years: 11.50    Types: Cigars, Cigarettes    Quit date: 04/23/2019    Years since quitting: 1.1  . Smokeless tobacco: Never Used  . Tobacco comment: Pt states vaping every day.  stopped vaping.  Vaping Use  . Vaping Use: Never used  Substance Use Topics  . Alcohol use: Yes    Alcohol/week: 1.0 standard drink    Types: 1 Shots of liquor per week    Comment: occ  . Drug use: No    Home Medications Prior to Admission  medications   Medication Sig Start Date End Date Taking? Authorizing Provider  albuterol (VENTOLIN HFA) 108 (90 Base) MCG/ACT inhaler Inhale 2 puffs into the lungs every 6 (six) hours as needed for wheezing or shortness of breath. 12/05/19   Collene Gobble, MD  ARIPiprazole (ABILIFY) 30 MG tablet Take 30 mg by mouth daily.  08/07/17   [provider]  aspirin EC 81 MG tablet Take 1 tablet (81 mg total) by mouth daily. Swallow whole. 02/07/20   Chandrasekhar, Lyda Kalata A, MD  atorvastatin (LIPITOR) 40 MG tablet Take 2 tablets (80 mg total) by mouth daily. Patient taking differently: Take 40 mg by mouth daily. 02/12/20   Chandrasekhar, Lyda Kalata A, MD  B Complex-C (B-COMPLEX WITH VITAMIN C) tablet Take 1 tablet by mouth daily.    [provider]  dexlansoprazole (DEXILANT) 60 MG capsule Take 1 capsule (60 mg total) by mouth daily. Office visit for further refills 07/24/19   Irene Shipper, MD  diclofenac sodium (VOLTAREN) 1 % GEL Apply 2 g topically 4 (four) times daily as needed (pain). 10/24/15   [provider]  fexofenadine (ALLEGRA) 180 MG tablet Take 1 tablet (180 mg total) by mouth daily. 05/23/20 08/21/20  [place] Donnamarie Poag, DO  FLUoxetine (PROZAC) 20 MG capsule Take 20 mg by mouth 2 (two) times daily.  12/01/16   [provider]  fluticasone (FLONASE) 50 MCG/ACT nasal spray SPRAY TWO SPRAYS IN EACH NOSTRIL ONCE DAILY Patient taking differently: Place 2 sprays into both nostrils at bedtime. 08/21/19   [name], FNP  folic acid (FOLVITE) 1 MG tablet Take 1 tablet (1 mg total) by mouth daily. 03/07/20   Janith Lima, MD  furosemide (LASIX) 40 MG tablet Take 1 tablet (40 mg total) by mouth 2 (two) times daily. 05/23/20 08/21/20  [place] Eric J, DO  HYDROcodone-acetaminophen (NORCO/VICODIN) 5-325 MG tablet Take 1 tablet by mouth every 6 (six) hours as needed for moderate pain.    [provider]  lactulose (CHRONULAC) 10 GM/15ML solution Take 45 mLs (30 g  total) by mouth 2 (two) times daily. 05/23/20 08/21/20  [place] Donnamarie Poag, DO  lamoTRIgine (LAMICTAL) 100 MG tablet Take 100 mg by mouth at bedtime.    [provider]  montelukast (SINGULAIR) 10 MG tablet Take 1 tablet (10 mg total) by mouth at bedtime. 05/23/20 08/21/20  [place] Eric J, DO  Multiple Vitamins-Minerals (MULTIVITAMIN ADULT EXTRA C) CHEW  Chew 1 tablet by mouth daily.    [provider]  NARCAN 4 MG/0.1ML LIQD nasal spray kit Place 0.4 mg into the nose as needed (overdose). 04/20/17   [provider]  potassium chloride 20 MEQ TBCR Take 20 mEq by mouth daily. 05/25/20   Lucrezia Starch, MD  promethazine (PHENERGAN) 25 MG suppository INSERT ONE SUPPOSITORY RECTALLY EVERY 6 HOURS AS NEEDED FOR  NAUSEA/ VOMITING 05/06/20   Irene Shipper, MD  Respiratory Therapy Supplies (FLUTTER) DEVI 3 puffs by Does not apply route 4 (four) times daily as needed. Needs to use device 20 min 3-4 times a day for coughing 07/05/19   Martyn Ehrich, NP  tamsulosin (FLOMAX) 0.4 MG CAPS capsule Take 0.4 mg by mouth daily. 04/22/20   [provider]  thiamine (VITAMIN B-1) 50 MG tablet Take 1 tablet (50 mg total) by mouth daily. Patient not taking: Reported on 05/13/2020 03/12/20   Janith Lima, MD  Tiotropium Bromide Monohydrate (SPIRIVA RESPIMAT) 2.5 MCG/ACT AERS Inhale 2 puffs into the lungs daily. 12/25/19   Martyn Ehrich, NP    Allergies    Patient has no known allergies.  Review of Systems   Review of Systems  Constitutional: Negative for chills and fever.  HENT: Negative for sore throat.   Eyes: Negative for redness.  Respiratory: Positive for cough, shortness of breath and wheezing.   Cardiovascular: Negative for chest pain and leg swelling.  Gastrointestinal: Negative for abdominal pain, diarrhea and vomiting.  Endocrine: Negative for polyuria.  Genitourinary: Negative for flank pain.  Musculoskeletal: Negative for back pain and neck pain.  Skin: Negative for  rash.  Neurological: Negative for headaches.  Hematological: Does not bruise/bleed easily.  Psychiatric/Behavioral: Negative for confusion.    Physical Exam Updated Vital Signs BP (!) 125/106   Pulse 90   Temp 98.3 F (36.8 C) (Oral)   Resp (!) 35   SpO2 94%   Physical Exam Vitals and nursing note reviewed.  Constitutional:      Appearance: Normal appearance. She is well-developed.  HENT:     Head: Atraumatic.     Nose: Nose normal.     Mouth/Throat:     Mouth: Mucous membranes are moist.  Eyes:     General: No scleral icterus.    Conjunctiva/sclera: Conjunctivae normal.  Neck:     Trachea: No tracheal deviation.  Cardiovascular:     Rate and Rhythm: Normal rate and regular rhythm.     Pulses: Normal pulses.     Heart sounds: Normal heart sounds. No murmur heard. No friction rub. No gallop.   Pulmonary:     Effort: Respiratory distress present.     Breath sounds: Wheezing, rhonchi and rales present.  Abdominal:     General: Bowel sounds are normal. There is no distension.     Palpations: Abdomen is soft.     Tenderness: There is no abdominal tenderness. There is no guarding.  Genitourinary:    Comments: No cva tenderness.  Musculoskeletal:        General: No tenderness.     Cervical back: Normal range of motion and neck supple. No rigidity. No muscular tenderness.     Comments: Mild, symmetric bilateral leg edema.   Skin:    General: Skin is warm and dry.     Findings: No rash.  Neurological:     Mental Status: She is alert.     Comments: Alert, speech normal.   Psychiatric:  Mood and Affect: Mood normal.     ED Results / Procedures / Treatments   Labs (all labs ordered are listed, but only abnormal results are displayed) Results for orders placed or performed during the hospital encounter of 06/05/2020  Resp Panel by RT-PCR (Flu A&B, Covid) Nasopharyngeal Swab   Specimen: Nasopharyngeal Swab; Nasopharyngeal(NP) swabs in vial transport medium   Result Value Ref Range   SARS Coronavirus 2 by RT PCR NEGATIVE NEGATIVE   Influenza A by PCR NEGATIVE NEGATIVE   Influenza B by PCR NEGATIVE NEGATIVE  Brain natriuretic peptide  Result Value Ref Range   B Natriuretic Peptide 366.0 (H) 0.0 - 100.0 pg/mL  CBC  Result Value Ref Range   WBC 16.5 (H) 4.0 - 10.5 K/uL   RBC 2.79 (L) 3.87 - 5.11 MIL/uL   Hemoglobin 9.9 (L) 12.0 - 15.0 g/dL   HCT 87.2 (L) 35.7 - 50.2 %   MCV 107.5 (H) 80.0 - 100.0 fL   MCH 35.5 (H) 26.0 - 34.0 pg   MCHC 33.0 30.0 - 36.0 g/dL   RDW 68.0 (H) 61.4 - 14.9 %   Platelets 387 150 - 400 K/uL   nRBC 0.0 0.0 - 0.2 %  Comprehensive metabolic panel  Result Value Ref Range   Sodium 141 135 - 145 mmol/L   Potassium 3.8 3.5 - 5.1 mmol/L   Chloride 105 98 - 111 mmol/L   CO2 23 22 - 32 mmol/L   Glucose, Bld 177 (H) 70 - 99 mg/dL   BUN 12 6 - 20 mg/dL   Creatinine, Ser 2.43 0.44 - 1.00 mg/dL   Calcium 8.4 (L) 8.9 - 10.3 mg/dL   Total Protein 6.8 6.5 - 8.1 g/dL   Albumin 2.8 (L) 3.5 - 5.0 g/dL   AST 525 (H) 15 - 41 U/L   ALT 43 0 - 44 U/L   Alkaline Phosphatase 147 (H) 38 - 126 U/L   Total Bilirubin 4.2 (H) 0.3 - 1.2 mg/dL   GFR, Estimated >51 >86 mL/min   Anion gap 13 5 - 15  Protime-INR  Result Value Ref Range   Prothrombin Time 18.8 (H) 11.4 - 15.2 seconds   INR 1.6 (H) 0.8 - 1.2  Ammonia  Result Value Ref Range   Ammonia 35 9 - 35 umol/L  I-stat chem 8, ED (not at [facility] or [facility])  Result Value Ref Range   Sodium 141 135 - 145 mmol/L   Potassium 3.9 3.5 - 5.1 mmol/L   Chloride 102 98 - 111 mmol/L   BUN 11 6 - 20 mg/dL   Creatinine, Ser 0.26 0.44 - 1.00 mg/dL   Glucose, Bld 269 (H) 70 - 99 mg/dL   Calcium, Ion 2.39 (L) 1.15 - 1.40 mmol/L   TCO2 25 22 - 32 mmol/L   Hemoglobin 11.2 (L) 12.0 - 15.0 g/dL   HCT 07.4 (L) 97.3 - 66.6 %  Troponin I (High Sensitivity)  Result Value Ref Range   Troponin I (High Sensitivity) 18 (H) <18 ng/L   CT ABDOMEN PELVIS WO CONTRAST  Result Date: 05/15/2020 CLINICAL  DATA:  Acute renal insufficiency, cirrhosis EXAM: CT ABDOMEN AND PELVIS WITHOUT CONTRAST TECHNIQUE: Multidetector CT imaging of the abdomen and pelvis was performed following the standard protocol without IV contrast. COMPARISON:  05/15/2020, 03/11/2013 FINDINGS: Lower chest: Patchy bibasilar airspace disease consistent with multifocal pneumonia. No effusion. Hepatobiliary: Diffuse hepatic steatosis again noted, with heterogeneous nodularity consistent with known cirrhosis. Please refer to MRI performed earlier today for detailed  description of the liver parenchyma. The gallbladder is distended, with no evidence of cholelithiasis or cholecystitis. Pancreas: Diffuse pancreatic parenchymal atrophy. No focal abnormality. Spleen: Normal in size without focal abnormality. Adrenals/Urinary Tract: No urinary tract calculi or obstructive uropathy. Left adrenals unremarkable. Right adrenal calcification may reflect previous infection or hemorrhage. Bladder is decompressed, limiting evaluation. Stomach/Bowel: No bowel obstruction or ileus. No bowel wall thickening or inflammatory change. Vascular/Lymphatic: Evaluation of the vasculature is limited on this unenhanced exam. Numerous venous collaterals within the upper abdomen likely sequela of portal venous hypertension. Mild atherosclerosis of the abdominal aorta. No pathologic adenopathy. Reproductive: Status post hysterectomy. No adnexal masses. Other: Trace ascites. Body wall edema lower abdomen and pelvis unchanged since earlier MRI. No abdominal wall hernia. Musculoskeletal: No acute or destructive bony lesions. Reconstructed images demonstrate no additional findings. IMPRESSION: 1. Unremarkable unenhanced appearance of the kidneys. 2. Hepatic steatosis, with heterogeneous nodularity consistent with cirrhosis. Please refer to preceding MRI performed earlier today for detailed description of the liver findings. 3. Patchy bibasilar airspace disease consistent with  multifocal pneumonia, stable. 4. Trace ascites. 5. Stable body wall edema. 6.  Aortic Atherosclerosis (ICD10-I70.0). Electronically Signed   By: Randa Ngo M.D.   On: 05/15/2020 19:36   DG Chest 2 View  Result Date: 05/25/2020 CLINICAL DATA:  Chest pain. EXAM: CHEST - 2 VIEW COMPARISON:  CT chest May 19, 2020. Chest radiograph May 18, 2020. FINDINGS: No substantial change in multifocal bilateral nodular consolidation, better characterized on recent CT chest. No visible pleural effusions or pneumothorax. Similar borderline enlargement the cardiac silhouette. No evidence of acute osseous abnormality. IMPRESSION: No substantial change in bilateral nodular consolidation, better characterized on recent CT chest and concerning for multifocal pneumonia. Electronically Signed   By: Margaretha Sheffield MD   On: 05/25/2020 16:45   DG Chest 2 View  Result Date: 05/13/2020 CLINICAL DATA:  Pedal edema.  Bilateral foot swelling for 2 weeks. EXAM: CHEST - 2 VIEW COMPARISON:  Included portion from coronary CT 02/06/2020, chest radiograph 08/21/2019 FINDINGS: Left costophrenic angle excluded from the field of view. Lower lung volumes from prior exam. There is diffuse interstitial and bronchial thickening. Superimposed patchy airspace opacity in the left greater than right perihilar lungs. The heart is normal in size. Unchanged mediastinal contours. No significant pleural effusion. No pneumothorax. No acute osseous abnormalities are seen. IMPRESSION: 1. Diffuse interstitial and bronchial thickening which may be due to bronchial inflammation or congestive. 2. Additional patchy bilateral perihilar opacities, more typical of multifocal infection. Recommend correlation for infectious symptoms. Electronically Signed   By: Keith Rake M.D.   On: 05/13/2020 17:51   CT CHEST WO CONTRAST  Result Date: 05/19/2020 CLINICAL DATA:  Shortness of breath. EXAM: CT CHEST WITHOUT CONTRAST TECHNIQUE: Multidetector CT imaging of  the chest was performed following the standard protocol without IV contrast. COMPARISON:  Chest x-ray from yesterday. CT chest dated April 04, 2019. FINDINGS: Cardiovascular: No significant vascular findings. Normal heart size. No pericardial effusion. No thoracic aortic aneurysm. Coronary, aortic arch, and branch vessel atherosclerotic vascular disease. Mediastinum/Nodes: Multiple prominent subcentimeter mediastinal lymph nodes have slightly increased in size since the prior study and are likely reactive. No enlarged axillary lymph nodes. The thyroid gland, trachea, and esophagus demonstrate no significant findings. Lungs/Pleura: Patchy ground-glass densities and small nodular consolidations throughout both lungs. No cavitary lesion. Unchanged partially calcified 1.6 cm right apical nodule. No pleural effusion or pneumothorax. Upper Abdomen: No acute abnormality. Unchanged nodular liver contour and diffusely decreased hepatic density.  Unchanged mild splenomegaly. Chronic right adrenal gland calcifications. Musculoskeletal: No chest wall mass or suspicious bone lesions identified. IMPRESSION: 1. Patchy ground-glass densities and small nodular consolidations throughout both lungs, consistent with multifocal pneumonia. Atypical viral pneumonia is a consideration. No cavitary lesions. 2. Cirrhosis and sequelae of portal hypertension. 3. Aortic Atherosclerosis (ICD10-I70.0). Electronically Signed   By: Titus Dubin M.D.   On: 05/19/2020 12:22   CT ABDOMEN PELVIS W CONTRAST  Result Date: 05/25/2020 CLINICAL DATA:  Nausea and vomiting. Left upper quadrant and epigastric pain. EXAM: CT ABDOMEN AND PELVIS WITH CONTRAST TECHNIQUE: Multidetector CT imaging of the abdomen and pelvis was performed using the standard protocol following bolus administration of intravenous contrast. CONTRAST:  110mL OMNIPAQUE IOHEXOL 300 MG/ML  SOLN COMPARISON:  CT of the chest May 19, 2020. CT of the abdomen and pelvis May 15, 2020.  FINDINGS: Lower chest: Patchy infiltrates in the lung bases are again identified, worsened since May 15, 2020 and similar since May 19, 2020. No other acute abnormalities in the lung bases. Hepatobiliary: Cirrhotic changes to the liver remain. The gallbladder remains distended without obvious wall thickening or focal adjacent fat stranding. The portal vein is patent. No liver masses identified. Pancreas: Atrophy of the pancreas without focal mass or evidence of pancreatitis. No interval change or acute abnormality associated with the pancreas. Spleen: Normal in size without focal abnormality. Adrenals/Urinary Tract: Calcification of the right adrenal gland remains, unchanged, of no acute significance. The left adrenal gland is normal. The kidneys, ureters, and bladder are unremarkable. Stomach/Bowel: The stomach and small bowel are normal. An anastomotic ring is associated with the rectum consistent with previous surgery. A few scattered colonic diverticuli are seen without diverticulitis. No colonic wall thickening. The appendix is normal. Vascular/Lymphatic: Mild calcified atherosclerosis is seen in the nonaneurysmal aorta. No adenopathy. Reproductive: Status post hysterectomy. No adnexal masses. Other: Mild ascites in the abdomen, similar in the interval. Musculoskeletal: No acute or significant osseous findings. IMPRESSION: 1. Infiltrates in the lung bases are similar since May 19, 2020 consistent with multifocal pneumonia, possibly an atypical pneumonia. 2. Cirrhotic changes in the liver.  Mild ascites. 3. Continued mild distension of the gallbladder. No wall thickening. 4. Diverticulosis without diverticulitis. 5. Calcified atherosclerotic change in the nonaneurysmal aorta. Electronically Signed   By: Dorise Bullion III M.D   On: 05/25/2020 19:06   MR LIVER W WO CONTRAST  Result Date: 05/15/2020 CLINICAL DATA:  Cirrhosis. EXAM: MRI ABDOMEN WITHOUT AND WITH CONTRAST TECHNIQUE: Multiplanar  multisequence MR imaging of the abdomen was performed both before and after the administration of intravenous contrast. CONTRAST:  31mL GADAVIST GADOBUTROL 1 MMOL/ML IV SOLN COMPARISON:  Abdominal ultrasound 05/09/2020. FINDINGS: Lower chest: Heart size upper normal. There is patchy airspace disease in both lower lungs, possibly atelectasis although pneumonia not excluded. Hepatobiliary: Heterogeneous lobular liver is compatible with cirrhosis. Heterogeneous loss of signal intensity in the liver parenchyma on out of phase T1 imaging is consistent with steatosis. Tiny foci of arterial phase hyperenhancement are identified in the anterior right liver (see images 37, 39, and 48 of series 19). No focal restricted diffusion within the liver parenchyma. Gallbladder is distended without evidence of stone disease. No intrahepatic or extrahepatic biliary dilation. Pancreas: No focal mass lesion. Diffuse loss of parenchymal volume is compatible with atrophy. No discernible main duct dilatation. Spleen:  No splenomegaly. No focal mass lesion. Adrenals/Urinary Tract: No adrenal nodule or mass. Kidneys unremarkable. Stomach/Bowel: Stomach is decompressed. Duodenum is normally positioned as is the ligament  of [place]. No small bowel or colonic dilatation within the visualized abdomen. Vascular/Lymphatic: No abdominal aortic aneurysm. No abdominal lymphadenopathy. Other:  Small volume ascites. Musculoskeletal: Diffuse body wall edema No focal suspicious marrow enhancement within the visualized bony anatomy. IMPRESSION: 1. Nodular liver compatible with reported history of cirrhosis. Tiny foci of arterial phase hyperenhancement in the anterior right liver are nonspecific ( LI-RADS 3). Follow-up MRI in 3 months recommended to ensure stability. 2. Steatosis. 3. Patchy airspace disease in both lower lungs, possibly atelectasis although pneumonia a distinct consideration. 4. Small volume ascites. 5. Diffuse body wall edema.  Electronically Signed   By: Misty Stanley M.D.   On: 05/15/2020 09:51   US RENAL  Result Date: 05/15/2020 CLINICAL DATA:  Acute renal insufficiency EXAM: RENAL / URINARY TRACT ULTRASOUND COMPLETE COMPARISON:  Abdominal sonogram 06/16/2017, concurrently performed MRI examination of the abdomen FINDINGS: Right Kidney: Renal measurements: 13.2 x 6.4 x 7.8 cm = volume: 346 mL. Visualization of the right kidney is quite poor due to overlying soft tissue and obscuration of the upper and lower pole by bowel gas. The visualized renal cortical thickness is preserved and cortical echogenicity is normal. No hydronephrosis. Left Kidney: Renal measurements: At least 10.0 x 6.3 x 6.4 cm = volume: 212 mL. The lower 1/2 of the left kidney is obscured by overlying bowel gas. The visualized upper pole the left kidney demonstrates normal cortical thickness and echogenicity. There is no hydronephrosis. Bladder: Partially decompressed. Other: None. IMPRESSION: Markedly limited, but unremarkable examination Electronically Signed   By: Fidela Salisbury MD   On: 05/15/2020 13:46   DG Chest Port 1 View  Result Date: 05/26/2020 CLINICAL DATA:  Short of breath.  Cirrhosis EXAM: PORTABLE CHEST 1 VIEW COMPARISON:  05/25/2020 FINDINGS: Severe diffuse bilateral airspace disease with interval progression. This is relatively symmetric. No significant effusion. IMPRESSION: Severe diffuse bilateral airspace disease with significant progression. Electronically Signed   By: Franchot Gallo M.D.   On: 05/25/2020 13:40   DG CHEST PORT 1 VIEW  Result Date: 05/18/2020 CLINICAL DATA:  Acute kidney injury, cirrhosis, volume overload, shortness of breath EXAM: PORTABLE CHEST 1 VIEW COMPARISON:  Portable exam 1042 hours compared to 05/12/2020 FINDINGS: Borderline enlargement of cardiac silhouette. Mediastinal contours normal. Patchy BILATERAL pulmonary infiltrates consistent with multifocal pneumonia. Infiltrates are asymmetrically greater in LEFT  upper lobe. Question cavitary lesion LEFT upper lobe 2.9 x 2.6 cm. No pleural effusion or pneumothorax. Osseous structures unremarkable. IMPRESSION: Patchy BILATERAL pulmonary infiltrates consistent with multifocal pneumonia. Question cavitary nodular focus in the LEFT upper lobe 2.9 x 2.6 cm diameter; CT chest recommended to exclude cavitary pneumonia. Electronically Signed   By: Lavonia Dana M.D.   On: 05/18/2020 12:45   ECHOCARDIOGRAM COMPLETE  Result Date: 05/15/2020    ECHOCARDIOGRAM REPORT   Patient Name:   TRACE CEDERBERG Date of Exam: 05/15/2020 Medical Rec #:  [id number]      Height:       65.0 in Accession #:    [id number]     Weight:       236.1 lb Date of Birth:  09-23-1960      BSA:          2.123 m Patient Age:    12 years       BP:           117/49 mmHg Patient Gender: F              HR:  74 bpm. Exam Location:  Inpatient Procedure: 2D Echo, Cardiac Doppler, Color Doppler and Intracardiac            Opacification Agent Indications:    CHF-Acute Diastolic E70.35  History:        Patient has no prior history of Echocardiogram examinations.                 COPD; Risk Factors:Current Smoker, Hypertension, Dyslipidemia                 and ETOH.  Sonographer:    Bernadene Person RDCS Referring Phys: [id number] [place]  1. Left ventricular ejection fraction, by estimation, is 60 to 65%. The left ventricle has normal function. The left ventricle has no regional wall motion abnormalities. Left ventricular diastolic parameters were normal.  2. Right ventricular systolic function is normal. The right ventricular size is normal. There is normal pulmonary artery systolic pressure.  3. Left atrial size was mildly dilated.  4. The mitral valve is normal in structure. Mild mitral valve regurgitation. No evidence of mitral stenosis.  5. The aortic valve is normal in structure. Aortic valve regurgitation is not visualized. No aortic stenosis is present.  6. The inferior vena cava is normal in size  with greater than 50% respiratory variability, suggesting right atrial pressure of 3 mmHg. FINDINGS  Left Ventricle: Left ventricular ejection fraction, by estimation, is 60 to 65%. The left ventricle has normal function. The left ventricle has no regional wall motion abnormalities. Definity contrast agent was given IV to delineate the left ventricular  endocardial borders. The left ventricular internal cavity size was normal in size. There is no left ventricular hypertrophy. Left ventricular diastolic parameters were normal. Right Ventricle: The right ventricular size is normal. No increase in right ventricular wall thickness. Right ventricular systolic function is normal. There is normal pulmonary artery systolic pressure. Left Atrium: Left atrial size was mildly dilated. Right Atrium: Right atrial size was normal in size. Pericardium: There is no evidence of pericardial effusion. Mitral Valve: The mitral valve is normal in structure. Mild mitral valve regurgitation. No evidence of mitral valve stenosis. Tricuspid Valve: The tricuspid valve is normal in structure. Tricuspid valve regurgitation is mild . No evidence of tricuspid stenosis. Aortic Valve: The aortic valve is normal in structure. Aortic valve regurgitation is not visualized. No aortic stenosis is present. Pulmonic Valve: The pulmonic valve was normal in structure. Pulmonic valve regurgitation is not visualized. No evidence of pulmonic stenosis. Aorta: The aortic root is normal in size and structure. Venous: The inferior vena cava is normal in size with greater than 50% respiratory variability, suggesting right atrial pressure of 3 mmHg. IAS/Shunts: No atrial level shunt detected by color flow Doppler.  LEFT VENTRICLE PLAX 2D LVIDd:         4.40 cm  Diastology LVIDs:         2.40 cm  LV e' medial:    8.92 cm/s LV PW:         0.80 cm  LV E/e' medial:  16.4 LV IVS:        0.80 cm  LV e' lateral:   9.03 cm/s LVOT diam:     1.70 cm  LV E/e' lateral: 16.2 LV  SV:         83 LV SV Index:   39 LVOT Area:     2.27 cm  RIGHT VENTRICLE RV S prime:     11.50 cm/s TAPSE (M-mode): 3.0 cm  LEFT ATRIUM             Index       RIGHT ATRIUM           Index LA diam:        4.10 cm 1.93 cm/m  RA Area:     12.70 cm LA Vol (A2C):   49.1 ml 23.13 ml/m RA Volume:   24.70 ml  11.64 ml/m LA Vol (A4C):   51.0 ml 24.03 ml/m LA Biplane Vol: 50.7 ml 23.88 ml/m  AORTIC VALVE LVOT Vmax:   165.00 cm/s LVOT Vmean:  113.000 cm/s LVOT VTI:    0.365 m  AORTA Ao Root diam: 2.70 cm Ao Asc diam:  2.70 cm MITRAL VALVE MV Area (PHT): 2.76 cm     SHUNTS MV Decel Time: 275 msec     Systemic VTI:  0.36 m MV E velocity: 146.00 cm/s  Systemic Diam: 1.70 cm MV A velocity: 71.00 cm/s MV E/A ratio:  2.06 Ena Dawley MD Electronically signed by Ena Dawley MD Signature Date/Time: 05/15/2020/2:31:06 PM    Final    [place] ASCITES (ABDOMEN LIMITED)  Result Date: 05/14/2020 CLINICAL DATA:  Abdominal distension, assess for ascites and paracentesis EXAM: LIMITED ABDOMEN ULTRASOUND FOR ASCITES TECHNIQUE: Limited ultrasound survey for ascites was performed in all four abdominal quadrants. COMPARISON:  03/11/2013 FINDINGS: Survey of the abdominal 4 quadrants demonstrates only a very minimal amount of right upper quadrant and right lower quadrant ascites. There is not enough fluid to warrant paracentesis. Procedure not performed. IMPRESSION: Trace abdominopelvic ascites. Electronically Signed   By: Jerilynn Mages.  Shick M.D.   On: 05/14/2020 13:27   US Abdomen Limited RUQ (LIVER/GB)  Result Date: 05/11/2020 CLINICAL DATA:  Cirrhosis screening EXAM: ULTRASOUND ABDOMEN LIMITED RIGHT UPPER QUADRANT COMPARISON:  Abdominal ultrasound 09/25/2019 FINDINGS: Gallbladder: Limited visualization. No gallstones or wall thickening visualized. No sonographic Murphy sign noted by sonographer. Common bile duct: Diameter: Not visualized on today's exam. Liver: No definite focal lesion identified. Diffusely increased liver parenchymal  echogenicity. Contour nodularity. Portal vein is patent on color Doppler imaging with normal direction of blood flow towards the liver. Other: Mild to moderate four quadrant ascites, greatest in the right lower quadrant. IMPRESSION: 1. Limited evaluation secondary to shadowing bowel gas and body habitus. 2. Appearance of the liver in keeping with history of cirrhosis. No new focal lesion identified. 3.  Mild to moderate four quadrant ascites. Electronically Signed   By: Audie Pinto M.D.   On: 05/11/2020 13:28    EKG EKG Interpretation  Date/Time:  Thursday May 29 2020 13:27:00 EDT Ventricular Rate:  76 PR Interval:  120 QRS Duration: 82 QT Interval:  404 QTC Calculation: 455 R Axis:   42 Text Interpretation: Sinus rhythm Low voltage, precordial leads Nonspecific T wave abnormality Confirmed by Lajean Saver [phone]) on 06/08/2020 1:39:47 PM   Radiology DG Chest Port 1 View  Result Date: 06/05/2020 CLINICAL DATA:  Short of breath.  Cirrhosis EXAM: PORTABLE CHEST 1 VIEW COMPARISON:  05/25/2020 FINDINGS: Severe diffuse bilateral airspace disease with interval progression. This is relatively symmetric. No significant effusion. IMPRESSION: Severe diffuse bilateral airspace disease with significant progression. Electronically Signed   By: Franchot Gallo M.D.   On: 05/28/2020 13:40    Procedures Procedures   Medications Ordered in ED Medications  furosemide (LASIX) injection 40 mg (has no administration in time range)  albuterol (PROVENTIL) (2.5 MG/3ML) 0.083% nebulizer solution 5 mg (has no administration in time range)  ipratropium (ATROVENT) nebulizer solution  0.5 mg (has no administration in time range)  ceFEPIme (MAXIPIME) 2 g in sodium chloride 0.9 % 100 mL IVPB (has no administration in time range)  vancomycin (VANCOREADY) IVPB 2000 mg/400 mL (has no administration in time range)  azithromycin (ZITHROMAX) 500 mg in sodium chloride 0.9 % 250 mL IVPB (has no administration in time  range)  albuterol (VENTOLIN HFA) 108 (90 Base) MCG/ACT inhaler 4 puff (4 puffs Inhalation Given 06/07/2020 1236)  ipratropium (ATROVENT HFA) inhaler 2 puff (2 puffs Inhalation Given 06/06/2020 1303)    ED Course  I have reviewed the triage vital signs and the nursing notes.  Pertinent labs & imaging results that were available during my care of the patient were reviewed by me and considered in my medical decision making (see chart for details).    MDM Rules/Calculators/A&P                         Iv ns. Continuous pulse ox and cardiac monitoring. o2 [place]. Stat labs. Imaging.  Reviewed nursing notes and prior charts for additional history. Recent admission reviewed.  While waiting labs, pt took off her o2/use bathroom - progressive dyspnea, and drop in sats, o2 replaced, o2 mask, sat upright/repositioned. Albuterol and atrovent tx. Lasix iv.   CXR reviewed/interpreted by me - progressive bilateral infiltrates.   Labs reviewed/interpreted by me - wbc elevated.   IV antibiotics given.   Recheck, no wheezing. But significant o2 requirement, rales bil. Will try bipap - resp therapy consulted.   CRITICAL CARE  RE: acute on chronic respiratory failure with hypoxia/increased o2 requirement, bilateral pulmonary infiltrates.  Performed by: Mirna Mires Total critical care time: 80 minutes Critical care time was exclusive of separately billable procedures and treating other patients. Critical care was necessary to treat or prevent imminent or life-threatening deterioration. Critical care was time spent personally by me on the following activities: development of treatment plan with patient and/or surrogate as well as nursing, discussions with consultants, evaluation of patient's response to treatment, examination of patient, obtaining history from patient or surrogate, ordering and performing treatments and interventions, ordering and review of laboratory studies, ordering and review of radiographic  studies, pulse oximetry and re-evaluation of patient's condition.  Discussed with hospitalists - will admit. Requests pulmonary consult.  PCCM  Consulted.   Pulmonary re--paged - discussed pt, markedly increased o2 requirement, worsening infiltrates, etc. - they will consult and see pt in ED soon.   Final Clinical Impression(s) / ED Diagnoses Final diagnoses:  None    Rx / DC Orders ED Discharge Orders    None          Lajean Saver, MD 05/25/2020 1503

## 2020-05-29 NOTE — Progress Notes (Signed)
A consult was received from an ED physician for Vancomycin per pharmacy dosing.  The patient's profile has been reviewed for ht/wt/allergies/indication/available labs.   A one time order has been placed for Vancomycin 2g.  Further antibiotics/pharmacy consults should be ordered by admitting physician if indicated.                       Thank you,  Gretta Arab PharmD, BCPS Clinical Pharmacist [facility] main pharmacy [phone] 06/10/2020 1:52 PM

## 2020-05-29 NOTE — ED Triage Notes (Signed)
Per EMS pt from home for SOB and increased abd swelling and family has noticed increased jaundice and confusion over the past couple weeks. Pt unable to take her ammonia bonding medications due to nausea. Pt home pulse ox reading was 54% on 6L via nasal canula. 62% on room air when EMS arrived to scene. Pt placed on NRB came up to 84%.  Zofran 4mg  given in route.  20g left forearm.

## 2020-05-29 NOTE — H&P (Signed)
History and Physical        Hospital Admission Note Date: 05/30/2020  Patient name: [name] [name] Medical record number: [id number] Date of birth: 09-Aug-1960 Age: 61 y.o. Gender: female  PCP: [name], FNP    Chief Complaint    Chief Complaint  Patient presents with  . Shortness of Breath  . Bloated      HPI:   This is a 60 year old female with past medical history of alcohol cirrhosis, COPD on 2 L nasal cannula since her recent hospitalization from 3/22-4/1 (treated for AKI secondary to hepatorenal syndrome and acute hypoxic respiratory failure secondary to multifocal pneumonia) who presented to the ED with worsening shortness of breath and persisting productive cough since her discharge. She was prescribed augmentin without any improvement, and actually symptoms worsened.  States that her O2 sat at home was down to 51% despite 5 L/min of her home O2 prompting her to contact EMS.  No sore throat or runny nose, fever or chills.  Denies chest pain but admits to epigastric pain and occasional wheezing.  Also admits to nausea and vomiting and has been having difficulty keeping her pills down for the past 24 hours, including her lasix.  States that she quit tobacco and vaping 1 year ago.   ED Course: Afebrile, tachypneic,  hemodynamically stable, hypoxic (SpO2 78% on 6 L/min) placed on 15 L/min followed by BiPAP for respiratory distress. Notable Labs: Glucose 177, alk phos 147, AST 146, ALT 43, T bili 4.2, BNP 366, WBC 16.5, INR 1.6, COVID-19 and flu negative. Notable Imaging: CXR-severe diffuse bilateral airspace disease with significant progression. Patient received albuterol, Atrovent, vancomycin, cefepime and azithromycin.    Vitals:   05/28/2020 1425 06/06/2020 1430  BP:  122/61  Pulse: 85 80  Resp: (!) 29 (!) 26  Temp:    SpO2: 92% 90%     Review of Systems:   Review of Systems  All other systems reviewed and are negative.   Medical/Social/Family History   Past Medical History: Past Medical History:  Diagnosis Date  . ABDOMINAL PAIN -GENERALIZED 09/27/2008   Qualifier: Diagnosis of  By: Trellis Paganini PA-c, Amy S   . Acute epigastric pain 04/27/2011  . Acute pancreatitis 08/12/2012  . Allergic rhinitis 09/05/2017  . Anal fistula   . Anemia   . Anxiety   . Arthritis   . BIPOLAR AFFECTIVE DISORDER 12/06/2006   Qualifier: History of  By: Elsworth Soho MD, Leanna Sato Bipolar disorder ([place])   . Chronic kidney disease   . COPD (chronic obstructive pulmonary disease) ([place]) 11/08/2017  . Cough 12/06/2006   Annotation: chronic since'96, nml spirometry, +ve methacholine challenge, nml  CT sinuses, inconclusive pH probe, esophageal manometry Centricity Description: SYMPTOM, COUGH Qualifier: History of  By: Elsworth Soho MD, Leanna Sato   Centricity Description: COUGH Qualifier: Diagnosis of  By: Elsworth Soho MD, Leanna Sato    . Depressive disorder, not elsewhere classified   . Diarrhea 09/27/2008   Qualifier: Diagnosis of  By: Trellis Paganini PA-c, Amy S   . Diverticulosis of colon (without mention of hemorrhage)   . Elevated liver enzymes 10/31/2015  . Esophageal reflux   . ETOH abuse 08/14/2012  . Gastritis 04/28/2011  .  GERD (gastroesophageal reflux disease)   . Hematochezia 08/14/2012  . Hemoptysis 05/26/2017  . HEMORRHOIDS 09/12/2007   Qualifier: Diagnosis of  By: Nils Pyle CMA ([place]), Mearl Latin    . Hepatic steatosis   . HIATAL HERNIA 09/12/2007   Qualifier: Diagnosis of  By: Nils Pyle CMA ([place]), Mearl Latin    . History of recurrent UTIs   . Hyperlipidemia   . Hypertension   . Hypertensive retinopathy of both eyes 11/24/2015  . INFECTIOUS DIARRHEA 09/27/2008   Qualifier: Diagnosis of  By: Laney Potash, Pam    . Iron deficiency   . Iron overload 09/22/2017  . Nausea alone 09/27/2008   Qualifier: Diagnosis of  By: Laney Potash, [place] 09/27/2008   Qualifier: Diagnosis of  By:  Shane Crutch, Amy S   . Obesity, Class III, BMI 40-49.9 (morbid obesity) ([place]) 04/27/2011  . Personal history of colonic polyps 10/16/2009   hyperplastic  . Polyarthralgia 12/02/2015   Evaluated by Dr. Charlestine Night (rheumatologist) 12/29/2015:1): 1)possible fibromyalgia (Suggest physical therapy and flexeril), 2) Polyarthralgia/Insomnia (suggest use of robaxin or flexeril), 3) Nicotine Dependence (counseled to quit). 4)Depression (continue therapy with psychiatrist)  . Pre-diabetes   . Pseudocyst of pancreas 08/12/2012  . Pulmonary nodule, right 11/29/2016  . Rectal fissure 05/11/2011  . Rectal fistula 05/11/2011  . RECTAL PAIN 09/12/2007   Qualifier: Diagnosis of  By: Nils Pyle CMA ([place]), Mearl Latin    . Secondary hemochromatosis 09/22/2017  . Seizures ([place])   . SIRS (systemic inflammatory response syndrome) ([place]) 03/11/2013  . Solitary rectal ulcer 04/12/2011  . TOBACCO ABUSE 12/06/2006   Qualifier: History of  By: Elsworth Soho MD, Leanna Sato.    . Ulcerative proctitis, nonspecific ([place]) 03/10/2011  . Varicose veins of both lower extremities 12/02/2015    Past Surgical History:  Procedure Laterality Date  . FINGER AMPUTATION  2010   right index  . HEMORROIDECTOMY    . KNEE ARTHROSCOPY     bilateral, left x2  . TOTAL ABDOMINAL HYSTERECTOMY    . VIDEO BRONCHOSCOPY Bilateral 05/31/2017   Procedure: VIDEO BRONCHOSCOPY WITHOUT FLUORO;  Surgeon: Collene Gobble, MD;  Location: [facility];  Service: Cardiopulmonary;  Laterality: Bilateral;    Medications: Prior to Admission medications   Medication Sig Start Date End Date Taking? Authorizing Provider  albuterol (VENTOLIN HFA) 108 (90 Base) MCG/ACT inhaler Inhale 2 puffs into the lungs every 6 (six) hours as needed for wheezing or shortness of breath. 12/05/19  Yes Collene Gobble, MD  amoxicillin-clavulanate (AUGMENTIN) 875-125 MG tablet Take 1 tablet by mouth 2 (two) times daily. Start date : 05/23/20   Yes [provider]  ARIPiprazole (ABILIFY) 30 MG  tablet Take 30 mg by mouth daily.  08/07/17  Yes [provider]  aspirin EC 81 MG tablet Take 1 tablet (81 mg total) by mouth daily. Swallow whole. 02/07/20  Yes Chandrasekhar, Mahesh A, MD  atorvastatin (LIPITOR) 40 MG tablet Take 2 tablets (80 mg total) by mouth daily. Patient taking differently: Take 40 mg by mouth daily. 02/12/20  Yes Chandrasekhar, Mahesh A, MD  B Complex-C (B-COMPLEX WITH VITAMIN C) tablet Take 1 tablet by mouth daily.   Yes [provider]  dexlansoprazole (DEXILANT) 60 MG capsule Take 1 capsule (60 mg total) by mouth daily. Office visit for further refills Patient taking differently: Take 1 capsule by mouth daily. 07/24/19  Yes Irene Shipper, MD  diclofenac sodium (VOLTAREN) 1 % GEL Apply 2 g topically 4 (four) times daily as needed (  pain). 10/24/15  Yes [provider]  fexofenadine (ALLEGRA) 180 MG tablet Take 1 tablet (180 mg total) by mouth daily. 05/23/20 08/21/20 Yes [place] Eric J, DO  FLUoxetine (PROZAC) 20 MG capsule Take 20 mg by mouth 2 (two) times daily.  12/01/16  Yes [provider]  fluticasone (FLONASE) 50 MCG/ACT nasal spray SPRAY TWO SPRAYS IN EACH NOSTRIL ONCE DAILY Patient taking differently: Place 2 sprays into both nostrils at bedtime. 08/21/19  Yes [name], FNP  folic acid (FOLVITE) 1 MG tablet Take 1 tablet (1 mg total) by mouth daily. 03/07/20  Yes Janith Lima, MD  furosemide (LASIX) 40 MG tablet Take 1 tablet (40 mg total) by mouth 2 (two) times daily. 05/23/20 08/21/20 Yes [place] Donnamarie Poag, DO  HYDROcodone-acetaminophen (NORCO/VICODIN) 5-325 MG tablet Take 1 tablet by mouth every 6 (six) hours as needed for moderate pain.   Yes [provider]  lactulose (CHRONULAC) 10 GM/15ML solution Take 45 mLs (30 g total) by mouth 2 (two) times daily. 05/23/20 08/21/20 Yes [place] Eric J, DO  lamoTRIgine (LAMICTAL) 100 MG tablet Take 100 mg by mouth at bedtime.   Yes [provider]  montelukast  (SINGULAIR) 10 MG tablet Take 1 tablet (10 mg total) by mouth at bedtime. 05/23/20 08/21/20 Yes [place] Eric J, DO  NARCAN 4 MG/0.1ML LIQD nasal spray kit Place 0.4 mg into the nose once as needed (overdose). 04/20/17  Yes [provider]  potassium chloride 20 MEQ TBCR Take 20 mEq by mouth daily. 05/25/20  Yes Dykstra, Ellwood Dense, MD  promethazine (PHENERGAN) 25 MG suppository INSERT ONE SUPPOSITORY RECTALLY EVERY 6 HOURS AS NEEDED FOR  NAUSEA/ VOMITING Patient taking differently: Place 25 mg rectally every 8 (eight) hours as needed for nausea or vomiting. 05/06/20  Yes Irene Shipper, MD  thiamine (VITAMIN B-1) 50 MG tablet Take 1 tablet (50 mg total) by mouth daily. 03/12/20  Yes Janith Lima, MD  Tiotropium Bromide Monohydrate (SPIRIVA RESPIMAT) 2.5 MCG/ACT AERS Inhale 2 puffs into the lungs daily. 12/25/19  Yes Martyn Ehrich, NP  Respiratory Therapy Supplies (FLUTTER) DEVI 3 puffs by Does not apply route 4 (four) times daily as needed. Needs to use device 20 min 3-4 times a day for coughing 07/05/19   Martyn Ehrich, NP    Allergies:  No Known Allergies  Social History:  reports that she quit smoking about 13 months ago. Her smoking use included cigars and cigarettes. She has a 11.50 pack-year smoking history. She has never used smokeless tobacco. She reports current alcohol use of about 1.0 standard drink of alcohol per week. She reports that she does not use drugs.  Family History: Family History  Problem Relation Age of Onset  . Irritable bowel syndrome Mother   . Hypertension Mother   . Atrial fibrillation Mother   . Ovarian cancer Maternal Aunt   . Diabetes Father   . Heart attack Father   . Hypertension Father   . Hyperlipidemia Father   . Thyroid disease Sister   . Hyperlipidemia Sister   . Thyroid disease Brother   . Colon polyps Brother   . Stomach cancer Neg Hx   . Colon cancer Neg Hx      Objective   Physical Exam: Blood pressure 122/61, pulse 80,  temperature 98.3 F (36.8 C), temperature source Oral, resp. rate (!) 26, SpO2 90 %.  Physical Exam Vitals and nursing note reviewed.  Constitutional:      Appearance: Normal  appearance.     Comments: Mild respiratory distress On BiPAP  HENT:     Head: Normocephalic and atraumatic.  Eyes:     Conjunctiva/sclera: Conjunctivae normal.  Cardiovascular:     Rate and Rhythm: Normal rate and regular rhythm.  Pulmonary:     Effort: Pulmonary effort is normal.     Breath sounds: Normal breath sounds.  Abdominal:     General: Abdomen is flat.     Palpations: Abdomen is soft.     Tenderness: There is abdominal tenderness in the epigastric area.  Musculoskeletal:        General: No swelling or tenderness.     Right lower leg: No tenderness. No edema.     Left lower leg: No tenderness. No edema.  Skin:    Coloration: Skin is not jaundiced or pale.  Neurological:     General: No focal deficit present.     Mental Status: She is alert. Mental status is at baseline.  Psychiatric:        Mood and Affect: Mood normal.        Behavior: Behavior normal.     LABS on Admission: I have personally reviewed all the labs and imaging below    Basic Metabolic Panel: Recent Labs  Lab 05/23/20 0355 05/25/20 1644 06/14/2020 1233 06/10/2020 1249  NA 139 139 141 141  K 3.9 3.1* 3.8 3.9  CL 102 102 105 102  CO2 $Re'29 26 23  'fWN$ --   GLUCOSE 109* 131* 177* 170*  BUN $Re'12 8 12 11  'ejs$ CREATININE 0.97 0.92 0.77 0.70  CALCIUM 8.4* 8.3* 8.4*  --   MG 1.8  --   --   --    Liver Function Tests: Recent Labs  Lab 05/25/20 1644 05/25/2020 1233  AST 134* 146*  ALT 40 43  ALKPHOS 153* 147*  BILITOT 4.7* 4.2*  PROT 6.8 6.8  ALBUMIN 3.0* 2.8*   Recent Labs  Lab 05/25/20 1644  LIPASE 25   Recent Labs  Lab 05/23/2020 1233  AMMONIA 35   CBC: Recent Labs  Lab 05/25/20 1644 05/23/2020 1233 05/30/2020 1249  WBC 8.1 16.5*  --   NEUTROABS 6.2  --   --   HGB 9.3* 9.9* 11.2*  HCT 28.0* 30.0* 33.0*  MCV 108.5*  107.5*  --   PLT 230 387  --    Cardiac Enzymes: No results for input(s): CKTOTAL, CKMB, CKMBINDEX, TROPONINI in the last 168 hours. BNP: Invalid input(s): POCBNP CBG: No results for input(s): GLUCAP in the last 168 hours.  Radiological Exams on Admission:  DG Chest Port 1 View  Result Date: 06/02/2020 CLINICAL DATA:  Short of breath.  Cirrhosis EXAM: PORTABLE CHEST 1 VIEW COMPARISON:  05/25/2020 FINDINGS: Severe diffuse bilateral airspace disease with interval progression. This is relatively symmetric. No significant effusion. IMPRESSION: Severe diffuse bilateral airspace disease with significant progression. Electronically Signed   By: Franchot Gallo M.D.   On: 06/21/2020 13:40      EKG: normal EKG, normal sinus rhythm, unchanged from previous tracings   A & P   Principal Problem:   Acute respiratory failure with hypoxia (HCC) Active Problems:   Acute epigastric pain   ETOH abuse   SIRS (systemic inflammatory response syndrome) (HCC)   Hypertension   Hyperlipidemia   Alcohol use disorder, severe, dependence ([place])   Bilirubinemia   Cirrhosis ([place])   1. SIRS  Acute hypoxic respiratory failure secondary to atypical pneumonia in the setting of COPD a. Afebrile with leukocytosis  b. Consulted with Dr. Ander Slade, PCCM, plan to change from bipap to HFNC, discontinue broad spectrum antibiotics and change to doxycycline with prednisone. Procalcitonin and sputum culture if able. c. Assess daily need for broadening antibiotics d. Will give a dose of Lasix as well as she has not taken her lasix due to vomiting and has an elevated BNP e. Continue home singulair  2. Epigastric pain a. Check lipase  3. Elevated troponin a. Trop 18 x2 b. Likely demand ischemia  4. Alcoholic cirrhosis  Alcohol use  a. Does not appear to be in withdrawal b. LFTs appear to be at/near baseline and needs outpatient GI follow up as mentioned at discharge c. Continue home lactulose and lasix d. CIWA  protocol  5. Hypertension, stable a. Not on meds  6. Hyperlipidemia a. Hold statin  7. History of Seizures a. Continue lamictal    DVT prophylaxis: lovenox   Code Status: Full Code  Diet: heart healthy. Carb modified Family Communication: Admission, patients condition and plan of care including tests being ordered have been discussed with the patient who indicates understanding and agrees with the plan and Code Status.  Disposition Plan: The appropriate patient status for this patient is INPATIENT. Inpatient status is judged to be reasonable and necessary in order to provide the required intensity of service to ensure the patient's safety. The patient's presenting symptoms, physical exam findings, and initial radiographic and laboratory data in the context of their chronic comorbidities is felt to place them at high risk for further clinical deterioration. Furthermore, it is not anticipated that the patient will be medically stable for discharge from the hospital within 2 midnights of admission. The following factors support the patient status of inpatient.   " The patient's presenting symptoms include shortness of breath, epigastric pain. " The worrisome physical exam findings include shortness of breath. " The initial radiographic and laboratory data are worrisome because of as above. " The chronic co-morbidities include COPD.   * I certify that at the point of admission it is my clinical judgment that the patient will require inpatient hospital care spanning beyond 2 midnights from the point of admission due to high intensity of service, high risk for further deterioration and high frequency of surveillance required.*   Status is: Inpatient  Remains inpatient appropriate because:IV treatments appropriate due to intensity of illness or inability to take PO and Inpatient level of care appropriate due to severity of illness   Dispo: The patient is from: Home              Anticipated  d/c is to: Home              Patient currently is not medically stable to d/c.   Difficult to place patient No         The medical decision making on this patient was of high complexity and the patient is at high risk for clinical deterioration, therefore this is a level 3  admission.  Consultants  . PCCM  Procedures  . Bipap -> HFNC  Time Spent on Admission: 74 minutes    Harold Hedge, DO [facility]  05/30/2020, 4:30 PM

## 2020-05-29 NOTE — Progress Notes (Signed)
Pt placed on Heated High Flow for low sats. Pt feeling nauseous on BIPAP. CCM and NP at bedside.

## 2020-05-29 NOTE — Consult Note (Signed)
NAME:  [name] [name], MRN:  [id number], DOB:  1961/01/06, LOS: 0 ADMISSION DATE:  06/08/2020, CONSULTATION DATE:  06/09/2020 REFERRING MD:  Dr. Ashok Cordia, CHIEF COMPLAINT:  Persistent hypoxia    History of Present Illness:  [name] [name] is a 60 y.o. female with PMX significant for but not limited to seizures, right pulmonary nodule, HTN, HLD, prior ETOH abuse with resultant cirrhosis, CKD, anxiety, and depression who presented with continued complaints of progressive hypoxia. Of note patient was recently treated at this facility 3/23 - 4/1 for multifocal PNA with ARF secondary to hepatorenal syndrome. She was discharged on Augmentin. She states she did not feel completely back to baseline by discharge but she feels significantly worse now. In addition to SOB she also reports some chills, fatigue, persistent productive cough with thick yellow/green sputum, nausea, and questionable thrush.SOB is worsened by exertion and lying flat. States she missed one dose of Augmentin due to nausea. She denies any smoking or vaping since March of 2021.   On EMS arrival she was seen hypoxic at 62% on RA. On ED arrival oxygen sats 93% on 15L [place] with all other vitals WNL. She later became more hypoxic on NRB resulting in application of BIPAP in ED. Pertinent labs include; alkaline phos 146, total albumin 4.2, BNP 366, WBC 16.5, Hgb 9.9. Given high supplemental oxygen need and pulmonary history PCCM was consulted for further management.   Pertinent  Medical History  Significant for but not limited to; Seizures Right pulmonary nodule HTN HLD Prior ETOH abuse with resultant cirrhosis CKD Anxiety Depression  Significant Hospital Events: Including procedures, antibiotic start and stop dates in addition to other pertinent events   . 4/7 Admitted for persistent hypoxia requiring application of BIPAP  Interim History / Subjective:  As above   Objective   Blood pressure 122/61, pulse 80, temperature 98.3 F (36.8 C),  temperature source Oral, resp. rate (!) 26, SpO2 90 %.        Intake/Output Summary (Last 24 hours) at 05/23/2020 1526 Last data filed at 06/19/2020 1429 Gross per 24 hour  Intake 100 ml  Output --  Net 100 ml   There were no vitals filed for this visit.  Examination: General: Chronically ill appearing middle aged female sitting up on ED stretcher in NAD HEENT: /AT, BIPAP in place, tongue is white with mild erythema to oropharynx, MM pink/moist, PERRL,  Neuro: Alert and oriented x3, non-focal  CV: s1s2 regular rate and rhythm, no murmur, rubs, or gallops,  PULM:  Course breath sounds bilaterally, mild tachypnea with faint accessory muscle use seen,  GI: soft, bowel sounds active in all 4 quadrants, non-tender, non-distended Extremities: warm/dry, no edema  Skin: no rashes or lesions  Labs/imaging that I have personally reviewed    4/7 CXR > Severe diffuse bilateral airspace disease with significant progression.  4/7 CMP/CBC; glucose 177, Alkaline phos 147, BNP 366, WBC 16.5, hgb 9.9  Resolved Hospital Problem list     Assessment & Plan:  Acute Hypoxic Respiratory Failure secondary to atypical pneumonia  -Symptoms worsened on PO Augmentin, some concern for adequate dosing due to N/V PTA P: PO Doxy x7 days, daily assessment for need to broaden  Stop all other broad spectrum ABTs for now  Frequent and adequate pulmonary hygiene  Head of bed elevated 30 degrees. Follow chest x-ray and ABG as needed  Obtain sputum culture If able  Short steroid course, $RemoveBeforeD'40mg'LoouiYFsWlyGJT$  prednisone x5 days Obtain and trend Procal   All other  acute and chronic co-morbidities managed per Primary   PCCM will continue to follow   Best practice   Diet:  Oral Pain/Anxiety/Delirium protocol (if indicated): No VAP protocol (if indicated): Not indicated DVT prophylaxis: Lovenox  GI prophylaxis: PPI Glucose control:  SSI No Central venous access:  N/A Arterial line:  N/A Foley:  N/A Mobility:  OOB  PT  consulted: Yes Last date of multidisciplinary goals of care discussion Pending  Code Status:  full code Disposition: Per primary   Labs   CBC: Recent Labs  Lab 05/25/20 1644 06/10/2020 1233 06/08/2020 1249  WBC 8.1 16.5*  --   NEUTROABS 6.2  --   --   HGB 9.3* 9.9* 11.2*  HCT 28.0* 30.0* 33.0*  MCV 108.5* 107.5*  --   PLT 230 387  --     Basic Metabolic Panel: Recent Labs  Lab 05/23/20 0355 05/25/20 1644 05/28/2020 1233 06/12/2020 1249  NA 139 139 141 141  K 3.9 3.1* 3.8 3.9  CL 102 102 105 102  CO2 $Re'29 26 23  'Uix$ --   GLUCOSE 109* 131* 177* 170*  BUN $Re'12 8 12 11  'DSR$ CREATININE 0.97 0.92 0.77 0.70  CALCIUM 8.4* 8.3* 8.4*  --   MG 1.8  --   --   --    GFR: Estimated Creatinine Clearance: 88.9 mL/min (by C-G formula based on SCr of 0.7 mg/dL). Recent Labs  Lab 05/25/20 1644 06/14/2020 1233  WBC 8.1 16.5*    Liver Function Tests: Recent Labs  Lab 05/23/20 0355 05/25/20 1644 05/26/2020 1233  AST 145* 134* 146*  ALT 40 40 43  ALKPHOS 140* 153* 147*  BILITOT 4.8* 4.7* 4.2*  PROT 6.3* 6.8 6.8  ALBUMIN 3.0* 3.0* 2.8*   Recent Labs  Lab 05/25/20 1644  LIPASE 25   Recent Labs  Lab 06/14/2020 1233  AMMONIA 35    ABG    Component Value Date/Time   TCO2 25 05/28/2020 1249     Coagulation Profile: Recent Labs  Lab 05/31/2020 1233  INR 1.6*    Cardiac Enzymes: No results for input(s): CKTOTAL, CKMB, CKMBINDEX, TROPONINI in the last 168 hours.  HbA1C: Hgb A1c MFr Bld  Date/Time Value Ref Range Status  03/07/2020 10:22 AM 5.9 4.6 - 6.5 % Final    Comment:    Glycemic Control Guidelines for People with Diabetes:Non Diabetic:  <6%Goal of Therapy: <7%Additional Action Suggested:  >8%   09/19/2019 10:29 AM 5.2 <5.7 % of total Hgb Final    Comment:    For the purpose of screening for the presence of diabetes: . <5.7%       Consistent with the absence of diabetes 5.7-6.4%    Consistent with increased risk for diabetes             (prediabetes) > or =6.5%   Consistent with diabetes . This assay result is consistent with a decreased risk of diabetes. . Currently, no consensus exists regarding use of hemoglobin A1c for diagnosis of diabetes in children. . According to American Diabetes Association (ADA) guidelines, hemoglobin A1c <7.0% represents optimal control in non-pregnant diabetic patients. Different metrics may apply to specific patient populations.  Standards of Medical Care in Diabetes(ADA). .     CBG: No results for input(s): GLUCAP in the last 168 hours.  Review of Systems:   Please see the history of present illness. All other systems reviewed and are negative   Past Medical History:  She,  has a past medical history of ABDOMINAL  PAIN -GENERALIZED (09/27/2008), Acute epigastric pain (04/27/2011), Acute pancreatitis (08/12/2012), Allergic rhinitis (09/05/2017), Anal fistula, Anemia, Anxiety, Arthritis, BIPOLAR AFFECTIVE DISORDER (12/06/2006), Bipolar disorder (HCC), Chronic kidney disease, COPD (chronic obstructive pulmonary disease) (HCC) (11/08/2017), Cough (12/06/2006), Depressive disorder, not elsewhere classified, Diarrhea (09/27/2008), Diverticulosis of colon (without mention of hemorrhage), Elevated liver enzymes (10/31/2015), Esophageal reflux, ETOH abuse (08/14/2012), Gastritis (04/28/2011), GERD (gastroesophageal reflux disease), Hematochezia (08/14/2012), Hemoptysis (05/26/2017), HEMORRHOIDS (09/12/2007), Hepatic steatosis, HIATAL HERNIA (09/12/2007), History of recurrent UTIs, Hyperlipidemia, Hypertension, Hypertensive retinopathy of both eyes (11/24/2015), INFECTIOUS DIARRHEA (09/27/2008), Iron deficiency, Iron overload (09/22/2017), Nausea alone (09/27/2008), NAUSEA AND VOMITING (09/27/2008), Obesity, Class III, BMI 40-49.9 (morbid obesity) (HCC) (04/27/2011), Personal history of colonic polyps (10/16/2009), Polyarthralgia (12/02/2015), Pre-diabetes, Pseudocyst of pancreas (08/12/2012), Pulmonary nodule, right (11/29/2016), Rectal fissure (05/11/2011),  Rectal fistula (05/11/2011), RECTAL PAIN (09/12/2007), 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   Surgical History:   Past Surgical History:  Procedure Laterality Date  . FINGER AMPUTATION  2010   right index  . HEMORROIDECTOMY    . KNEE ARTHROSCOPY     bilateral, left x2  . TOTAL ABDOMINAL HYSTERECTOMY    . VIDEO BRONCHOSCOPY Bilateral 05/31/2017   Procedure: VIDEO BRONCHOSCOPY WITHOUT FLUORO;  Surgeon: Leslye Peer, MD;  Location: [facility];  Service: Cardiopulmonary;  Laterality: Bilateral;     Social History:   reports that she quit smoking about 13 months ago. Her smoking use included cigars and cigarettes. She has a 11.50 pack-year smoking history. She has never used smokeless tobacco. She reports current alcohol use of about 1.0 standard drink of alcohol per week. She reports that she does not use drugs.   Family History:  Her family history includes Atrial fibrillation in her mother; Colon polyps in her brother; Diabetes in her father; Heart attack in her father; Hyperlipidemia in her father and sister; Hypertension in her father and mother; Irritable bowel syndrome in her mother; Ovarian cancer in her maternal aunt; Thyroid disease in her brother and sister. There is no history of Stomach cancer or Colon cancer.   Allergies No Known Allergies   Home Medications  Prior to Admission medications   Medication Sig Start Date End Date Taking? Authorizing Provider  albuterol (VENTOLIN HFA) 108 (90 Base) MCG/ACT inhaler Inhale 2 puffs into the lungs every 6 (six) hours as needed for wheezing or shortness of breath. 12/05/19  Yes Leslye Peer, MD  amoxicillin-clavulanate (AUGMENTIN) 875-125 MG tablet Take 1 tablet by mouth 2 (two)  times daily. Start date : 05/23/20   Yes [provider]  ARIPiprazole (ABILIFY) 30 MG tablet Take 30 mg by mouth daily.  08/07/17  Yes [provider]  aspirin EC 81 MG tablet Take 1 tablet (81 mg total) by mouth daily. Swallow whole. 02/07/20  Yes Chandrasekhar, Mahesh A, MD  atorvastatin (LIPITOR) 40 MG tablet Take 2 tablets (80 mg total) by mouth daily. Patient taking differently: Take 40 mg by mouth daily. 02/12/20  Yes Chandrasekhar, Mahesh A, MD  B Complex-C (B-COMPLEX WITH VITAMIN C) tablet Take 1 tablet by mouth daily.   Yes [provider]  dexlansoprazole (DEXILANT) 60 MG capsule Take 1 capsule (60 mg total) by mouth daily. Office visit for further refills Patient taking differently: Take 1 capsule by mouth daily. 07/24/19  Yes Hilarie Fredrickson, MD  diclofenac sodium (VOLTAREN) 1 % GEL Apply 2  g topically 4 (four) times daily as needed (pain). 10/24/15  Yes [provider]  fexofenadine (ALLEGRA) 180 MG tablet Take 1 tablet (180 mg total) by mouth daily. 05/23/20 08/21/20 Yes [place] Eric J, DO  FLUoxetine (PROZAC) 20 MG capsule Take 20 mg by mouth 2 (two) times daily.  12/01/16  Yes [provider]  fluticasone (FLONASE) 50 MCG/ACT nasal spray SPRAY TWO SPRAYS IN EACH NOSTRIL ONCE DAILY Patient taking differently: Place 2 sprays into both nostrils at bedtime. 08/21/19  Yes [name], FNP  folic acid (FOLVITE) 1 MG tablet Take 1 tablet (1 mg total) by mouth daily. 03/07/20  Yes Janith Lima, MD  furosemide (LASIX) 40 MG tablet Take 1 tablet (40 mg total) by mouth 2 (two) times daily. 05/23/20 08/21/20 Yes [place] Donnamarie Poag, DO  HYDROcodone-acetaminophen (NORCO/VICODIN) 5-325 MG tablet Take 1 tablet by mouth every 6 (six) hours as needed for moderate pain.   Yes [provider]  lactulose (CHRONULAC) 10 GM/15ML solution Take 45 mLs (30 g total) by mouth 2 (two) times daily. 05/23/20 08/21/20 Yes [place] Eric J, DO  lamoTRIgine (LAMICTAL)  100 MG tablet Take 100 mg by mouth at bedtime.   Yes [provider]  montelukast (SINGULAIR) 10 MG tablet Take 1 tablet (10 mg total) by mouth at bedtime. 05/23/20 08/21/20 Yes [place] Eric J, DO  NARCAN 4 MG/0.1ML LIQD nasal spray kit Place 0.4 mg into the nose once as needed (overdose). 04/20/17  Yes [provider]  potassium chloride 20 MEQ TBCR Take 20 mEq by mouth daily. 05/25/20  Yes Dykstra, Ellwood Dense, MD  promethazine (PHENERGAN) 25 MG suppository INSERT ONE SUPPOSITORY RECTALLY EVERY 6 HOURS AS NEEDED FOR  NAUSEA/ VOMITING Patient taking differently: Place 25 mg rectally every 8 (eight) hours as needed for nausea or vomiting. 05/06/20  Yes Irene Shipper, MD  thiamine (VITAMIN B-1) 50 MG tablet Take 1 tablet (50 mg total) by mouth daily. 03/12/20  Yes Janith Lima, MD  Tiotropium Bromide Monohydrate (SPIRIVA RESPIMAT) 2.5 MCG/ACT AERS Inhale 2 puffs into the lungs daily. 12/25/19  Yes Martyn Ehrich, NP  Respiratory Therapy Supplies (FLUTTER) DEVI 3 puffs by Does not apply route 4 (four) times daily as needed. Needs to use device 20 min 3-4 times a day for coughing 07/05/19   Martyn Ehrich, NP     Car[place], NP-C [facility] Personal contact information can be found on Amion  06/02/2020, 3:56 PM

## 2020-05-29 NOTE — Progress Notes (Signed)
Pharmacy Antibiotic Note  [name] [name] is a 60 y.o. female admitted on 06/18/2020 with pneumonia.  Pharmacy has been consulted for Cefepime and Vancomycin dosing.  Plan:  Azithromycin per MD  Cefepime 2g IV q8 hours  Vancomycin 2g IV x1 then 1500 IV q24h.  (Rounded SCr 0.8, Vd 0.5, Est AUC 489)  Measure Vanc peak and trough as needed; Goal AUC = 400 - 550.  Follow up renal function, culture results, and clinical course.      Temp (24hrs), Avg:98.3 F (36.8 C), Min:98.3 F (36.8 C), Max:98.3 F (36.8 C)  Recent Labs  Lab 05/23/20 0355 05/25/20 1644 06/19/2020 1233 05/28/2020 1249  WBC  --  8.1 16.5*  --   CREATININE 0.97 0.92 0.77 0.70    Estimated Creatinine Clearance: 88.9 mL/min (by C-G formula based on SCr of 0.7 mg/dL).    No Known Allergies  Antimicrobials this admission: 4/7 Cefepime >>  4/7 Vancomycin >>  4/7 Azithromycin  Dose adjustments this admission:   Microbiology results:  BCx:   UCx:    Sputum:    MRSA PCR:   Thank you for allowing pharmacy to be a part of this patient's care.  Gretta Arab PharmD, BCPS Clinical Pharmacist [facility] main pharmacy [phone] 05/28/2020 3:16 PM

## 2020-05-29 NOTE — Progress Notes (Signed)
Patient c/o discomfort in bladder area. Pt states she has not urinated since receiving lasix. She does feel urge to urinate but cannot start stream. Bladder scan showed 349cc. Unable to successfully In and out cath pt due to pt's anatomy. No available 14Fr or 12 Fr available in hospital.  Pt assisted to [facility] . Will monitor.

## 2020-05-30 ENCOUNTER — Inpatient Hospital Stay (HOSPITAL_COMMUNITY): Payer: PPO | Admitting: Certified Registered"

## 2020-05-30 ENCOUNTER — Inpatient Hospital Stay (HOSPITAL_COMMUNITY): Payer: PPO

## 2020-05-30 ENCOUNTER — Inpatient Hospital Stay: Payer: PPO | Admitting: Adult Health

## 2020-05-30 DIAGNOSIS — J9601 Acute respiratory failure with hypoxia: Secondary | ICD-10-CM | POA: Diagnosis not present

## 2020-05-30 LAB — BASIC METABOLIC PANEL
Anion gap: 10 (ref 5–15)
BUN: 15 mg/dL (ref 6–20)
CO2: 24 mmol/L (ref 22–32)
Calcium: 8 mg/dL — ABNORMAL LOW (ref 8.9–10.3)
Chloride: 105 mmol/L (ref 98–111)
Creatinine, Ser: 0.88 mg/dL (ref 0.44–1.00)
GFR, Estimated: >60 mL/min (ref >60)
Glucose, Bld: 142 mg/dL — ABNORMAL HIGH (ref 70–99)
Potassium: 3.4 mmol/L — ABNORMAL LOW (ref 3.5–5.1)
Sodium: 139 mmol/L (ref 135–145)

## 2020-05-30 LAB — CBC
HCT: 30.4 % — ABNORMAL LOW (ref 36.0–46.0)
Hemoglobin: 9.9 g/dL — ABNORMAL LOW (ref 12.0–15.0)
MCH: 35.5 pg — ABNORMAL HIGH (ref 26.0–34.0)
MCHC: 32.6 g/dL (ref 30.0–36.0)
MCV: 109 fL — ABNORMAL HIGH (ref 80.0–100.0)
Platelets: 352 10*3/uL (ref 150–400)
RBC: 2.79 MIL/uL — ABNORMAL LOW (ref 3.87–5.11)
RDW: 15.7 % — ABNORMAL HIGH (ref 11.5–15.5)
WBC: 14.5 10*3/uL — ABNORMAL HIGH (ref 4.0–10.5)
nRBC: 0 % (ref 0.0–0.2)

## 2020-05-30 LAB — EXPECTORATED SPUTUM ASSESSMENT W GRAM STAIN, RFLX TO RESP C

## 2020-05-30 LAB — GLUCOSE, CAPILLARY
Glucose-Capillary: 118 mg/dL — ABNORMAL HIGH (ref 70–99)
Glucose-Capillary: 129 mg/dL — ABNORMAL HIGH (ref 70–99)
Glucose-Capillary: 148 mg/dL — ABNORMAL HIGH (ref 70–99)
Glucose-Capillary: 181 mg/dL — ABNORMAL HIGH (ref 70–99)

## 2020-05-30 LAB — BLOOD GAS, ARTERIAL
Acid-base deficit: 5.9 mmol/L — ABNORMAL HIGH (ref 0.0–2.0)
Bicarbonate: 20 mmol/L (ref 20.0–28.0)
Drawn by: 331471
FIO2: 100
O2 Saturation: 92 %
Patient temperature: 98.6
RATE: 24 resp/min
pCO2 arterial: 43.6 mmHg (ref 32.0–48.0)
pH, Arterial: 7.283 — ABNORMAL LOW (ref 7.350–7.450)
pO2, Arterial: 76.2 mmHg — ABNORMAL LOW (ref 83.0–108.0)

## 2020-05-30 LAB — PHOSPHORUS: Phosphorus: 3.2 mg/dL (ref 2.5–4.6)

## 2020-05-30 LAB — PROCALCITONIN: Procalcitonin: 0.81 ng/mL

## 2020-05-30 LAB — MAGNESIUM: Magnesium: 1.3 mg/dL — ABNORMAL LOW (ref 1.7–2.4)

## 2020-05-30 MED ORDER — FOLIC ACID 1 MG PO TABS
1.0000 mg | ORAL_TABLET | Freq: Every day | ORAL | Status: DC
  Administered 2020-05-31 – 2020-06-02 (×3): 1 mg
  Filled 2020-05-30 (×3): qty 1

## 2020-05-30 MED ORDER — FENTANYL BOLUS VIA INFUSION
50.0000 ug | INTRAVENOUS | Status: DC | PRN
  Filled 2020-05-30: qty 100

## 2020-05-30 MED ORDER — PANTOPRAZOLE SODIUM 40 MG PO PACK
40.0000 mg | PACK | Freq: Every day | ORAL | Status: DC
  Administered 2020-05-30 – 2020-06-02 (×4): 40 mg
  Filled 2020-05-30 (×4): qty 20

## 2020-05-30 MED ORDER — MIDAZOLAM HCL 2 MG/2ML IJ SOLN
INTRAMUSCULAR | Status: AC
  Administered 2020-05-30: 1 mg
  Filled 2020-05-30: qty 2

## 2020-05-30 MED ORDER — SODIUM CHLORIDE 0.9 % IV SOLN: 250.0000 mL | INTRAVENOUS | Status: DC

## 2020-05-30 MED ORDER — ALBUMIN HUMAN 25 % IV SOLN
12.5000 g | Freq: Once | INTRAVENOUS | Status: AC
  Administered 2020-05-30: 12.5 g via INTRAVENOUS
  Filled 2020-05-30: qty 50

## 2020-05-30 MED ORDER — SODIUM CHLORIDE 0.9 % IV SOLN: INTRAVENOUS | Status: DC | PRN

## 2020-05-30 MED ORDER — DOCUSATE SODIUM 50 MG/5ML PO LIQD
100.0000 mg | Freq: Two times a day (BID) | ORAL | Status: DC
  Administered 2020-05-30 – 2020-06-01 (×4): 100 mg
  Filled 2020-05-30 (×5): qty 10

## 2020-05-30 MED ORDER — PROSOURCE TF PO LIQD
45.0000 mL | Freq: Every day | ORAL | Status: DC
Start: 2020-05-31 — End: 2020-06-03
  Administered 2020-05-31 – 2020-06-02 (×3): 45 mL
  Filled 2020-05-30 (×3): qty 45

## 2020-05-30 MED ORDER — MONTELUKAST SODIUM 10 MG PO TABS
10.0000 mg | ORAL_TABLET | Freq: Every day | ORAL | Status: DC
  Administered 2020-05-31 – 2020-06-02 (×3): 10 mg
  Filled 2020-05-30 (×3): qty 1

## 2020-05-30 MED ORDER — FENTANYL BOLUS VIA INFUSION
50.0000 ug | INTRAVENOUS | Status: DC | PRN
  Administered 2020-05-30 (×2): 50 ug via INTRAVENOUS
  Administered 2020-05-31: 100 ug via INTRAVENOUS
  Filled 2020-05-30: qty 100

## 2020-05-30 MED ORDER — PROSOURCE TF PO LIQD: 45.0000 mL | Freq: Two times a day (BID) | ORAL | Status: DC

## 2020-05-30 MED ORDER — ETOMIDATE 2 MG/ML IV SOLN
INTRAVENOUS | Status: DC | PRN
  Administered 2020-05-30: 20 mg via INTRAVENOUS

## 2020-05-30 MED ORDER — PHENYLEPHRINE HCL-NACL 10-0.9 MG/250ML-% IV SOLN
INTRAVENOUS | Status: AC
  Filled 2020-05-30: qty 250

## 2020-05-30 MED ORDER — PROPOFOL 1000 MG/100ML IV EMUL
5.0000 ug/kg/min | INTRAVENOUS | Status: DC
  Administered 2020-05-30: 10 ug/kg/min via INTRAVENOUS
  Filled 2020-05-30 (×2): qty 100

## 2020-05-30 MED ORDER — FENTANYL 2500MCG IN NS 250ML (10MCG/ML) PREMIX INFUSION
0.0000 ug/h | INTRAVENOUS | Status: DC
  Administered 2020-05-30: 50 ug/h via INTRAVENOUS
  Administered 2020-05-31: 100 ug/h via INTRAVENOUS
  Administered 2020-05-31 – 2020-06-01 (×2): 200 ug/h via INTRAVENOUS
  Administered 2020-06-01: 300 ug/h via INTRAVENOUS
  Administered 2020-06-01 – 2020-06-02 (×2): 200 ug/h via INTRAVENOUS
  Administered 2020-06-02: 250 ug/h via INTRAVENOUS
  Administered 2020-06-03: 300 ug/h via INTRAVENOUS
  Filled 2020-05-30 (×8): qty 250

## 2020-05-30 MED ORDER — FENTANYL CITRATE (PF) 100 MCG/2ML IJ SOLN
50.0000 ug | Freq: Once | INTRAMUSCULAR | Status: AC
Start: 2020-05-30 — End: 2020-05-30

## 2020-05-30 MED ORDER — SODIUM CHLORIDE 0.9 % IV SOLN
2.0000 g | Freq: Three times a day (TID) | INTRAVENOUS | Status: DC
  Administered 2020-05-30 – 2020-05-31 (×3): 2 g via INTRAVENOUS
  Filled 2020-05-30 (×3): qty 2

## 2020-05-30 MED ORDER — HYDROCODONE-ACETAMINOPHEN 5-325 MG PO TABS
1.0000 | ORAL_TABLET | ORAL | Status: DC | PRN
  Administered 2020-06-01: 1
  Filled 2020-05-30 (×2): qty 1

## 2020-05-30 MED ORDER — FENTANYL CITRATE (PF) 100 MCG/2ML IJ SOLN
INTRAMUSCULAR | Status: AC
  Administered 2020-05-30: 100 ug
  Filled 2020-05-30: qty 4

## 2020-05-30 MED ORDER — ASPIRIN 81 MG PO CHEW
81.0000 mg | CHEWABLE_TABLET | Freq: Every day | ORAL | Status: DC
  Administered 2020-05-30 – 2020-06-02 (×4): 81 mg
  Filled 2020-05-30 (×4): qty 1

## 2020-05-30 MED ORDER — NOREPINEPHRINE 4 MG/250ML-% IV SOLN
0.0000 ug/min | INTRAVENOUS | Status: DC
  Administered 2020-05-30: 17 ug/min via INTRAVENOUS
  Administered 2020-05-30: 2 ug/min via INTRAVENOUS
  Administered 2020-05-30: 16 ug/min via INTRAVENOUS
  Administered 2020-05-30: 17 ug/min via INTRAVENOUS
  Administered 2020-05-31: 16 ug/min via INTRAVENOUS
  Administered 2020-05-31: 14 ug/min via INTRAVENOUS
  Administered 2020-05-31: 9 ug/min via INTRAVENOUS
  Administered 2020-05-31: 16 ug/min via INTRAVENOUS
  Administered 2020-06-01: 8 ug/min via INTRAVENOUS
  Administered 2020-06-01: 7 ug/min via INTRAVENOUS
  Administered 2020-06-01: 8 ug/min via INTRAVENOUS
  Administered 2020-06-02: 10 ug/min via INTRAVENOUS
  Administered 2020-06-02: 9 ug/min via INTRAVENOUS
  Administered 2020-06-02: 12 ug/min via INTRAVENOUS
  Administered 2020-06-02: 13 ug/min via INTRAVENOUS
  Administered 2020-06-03: 17 ug/min via INTRAVENOUS
  Administered 2020-06-03: 19 ug/min via INTRAVENOUS
  Filled 2020-05-30 (×17): qty 250

## 2020-05-30 MED ORDER — VITAL HIGH PROTEIN PO LIQD: 1000.0000 mL | ORAL | Status: DC

## 2020-05-30 MED ORDER — LACTULOSE 10 GM/15ML PO SOLN
30.0000 g | Freq: Two times a day (BID) | ORAL | Status: DC
  Administered 2020-05-30 – 2020-06-02 (×7): 30 g
  Filled 2020-05-30 (×6): qty 45

## 2020-05-30 MED ORDER — VITAL HIGH PROTEIN PO LIQD
1000.0000 mL | ORAL | Status: DC
Start: 2020-05-30 — End: 2020-06-03
  Administered 2020-05-30 – 2020-06-01 (×3): 1000 mL

## 2020-05-30 MED ORDER — SUCCINYLCHOLINE CHLORIDE 20 MG/ML IJ SOLN
INTRAMUSCULAR | Status: DC | PRN
  Administered 2020-05-30: 100 mg via INTRAVENOUS

## 2020-05-30 MED ORDER — FENTANYL CITRATE (PF) 100 MCG/2ML IJ SOLN
50.0000 ug | Freq: Once | INTRAMUSCULAR | Status: AC
  Administered 2020-05-30: 100 ug via INTRAVENOUS
  Filled 2020-05-30: qty 2

## 2020-05-30 MED ORDER — THIAMINE HCL 100 MG PO TABS
100.0000 mg | ORAL_TABLET | Freq: Every day | ORAL | Status: DC
  Administered 2020-05-31 – 2020-06-02 (×2): 100 mg
  Filled 2020-05-30 (×2): qty 1

## 2020-05-30 MED ORDER — ORAL CARE MOUTH RINSE
15.0000 mL | OROMUCOSAL | Status: DC
  Administered 2020-05-30 – 2020-06-03 (×35): 15 mL via OROMUCOSAL

## 2020-05-30 MED ORDER — ADULT MULTIVITAMIN LIQUID CH
15.0000 mL | Freq: Every day | ORAL | Status: DC
  Administered 2020-05-30 – 2020-06-02 (×4): 15 mL
  Filled 2020-05-30 (×4): qty 15

## 2020-05-30 MED ORDER — PHENYLEPHRINE HCL-NACL 10-0.9 MG/250ML-% IV SOLN
25.0000 ug/min | INTRAVENOUS | Status: DC
  Administered 2020-05-30: 200 ug/min via INTRAVENOUS
  Filled 2020-05-30: qty 250

## 2020-05-30 MED ORDER — POTASSIUM CHLORIDE 10 MEQ/50ML IV SOLN
10.0000 meq | INTRAVENOUS | Status: AC
  Administered 2020-05-30 (×4): 10 meq via INTRAVENOUS
  Filled 2020-05-30 (×5): qty 50

## 2020-05-30 MED ORDER — POTASSIUM CHLORIDE 10 MEQ/50ML IV SOLN: 10.0000 meq | INTRAVENOUS | Status: DC

## 2020-05-30 MED ORDER — SODIUM CHLORIDE 0.9 % IV SOLN
250.0000 mL | INTRAVENOUS | Status: DC
  Administered 2020-05-30 – 2020-06-01 (×2): 250 mL via INTRAVENOUS

## 2020-05-30 MED ORDER — ACETAMINOPHEN 650 MG RE SUPP: 650.0000 mg | Freq: Four times a day (QID) | RECTAL | Status: DC | PRN

## 2020-05-30 MED ORDER — DEXMEDETOMIDINE HCL IN NACL 200 MCG/50ML IV SOLN
0.0000 ug/kg/h | INTRAVENOUS | Status: DC
  Administered 2020-05-30: 0.5 ug/kg/h via INTRAVENOUS
  Administered 2020-05-30: 0.4 ug/kg/h via INTRAVENOUS
  Administered 2020-05-30 (×2): 0.5 ug/kg/h via INTRAVENOUS
  Administered 2020-05-31: 0.6 ug/kg/h via INTRAVENOUS
  Administered 2020-05-31: 0.9 ug/kg/h via INTRAVENOUS
  Administered 2020-05-31: 0.7 ug/kg/h via INTRAVENOUS
  Administered 2020-05-31 (×3): 0.6 ug/kg/h via INTRAVENOUS
  Administered 2020-05-31: 0.8 ug/kg/h via INTRAVENOUS
  Administered 2020-05-31: 0.9 ug/kg/h via INTRAVENOUS
  Administered 2020-06-01: 0.7 ug/kg/h via INTRAVENOUS
  Administered 2020-06-01: 1 ug/kg/h via INTRAVENOUS
  Administered 2020-06-01: 0.9 ug/kg/h via INTRAVENOUS
  Administered 2020-06-01 (×3): 0.7 ug/kg/h via INTRAVENOUS
  Administered 2020-06-01: 1 ug/kg/h via INTRAVENOUS
  Administered 2020-06-01: 0.7 ug/kg/h via INTRAVENOUS
  Administered 2020-06-01: 0.9 ug/kg/h via INTRAVENOUS
  Administered 2020-06-02: 1 ug/kg/h via INTRAVENOUS
  Administered 2020-06-02: 0.9 ug/kg/h via INTRAVENOUS
  Administered 2020-06-02: 0.7 ug/kg/h via INTRAVENOUS
  Administered 2020-06-02 (×3): 0.9 ug/kg/h via INTRAVENOUS
  Filled 2020-05-30 (×5): qty 50
  Filled 2020-05-30: qty 100
  Filled 2020-05-30 (×21): qty 50

## 2020-05-30 MED ORDER — FENTANYL 2500MCG IN NS 250ML (10MCG/ML) PREMIX INFUSION
50.0000 ug/h | INTRAVENOUS | Status: DC
  Filled 2020-05-30: qty 250

## 2020-05-30 MED ORDER — POTASSIUM CHLORIDE CRYS ER 20 MEQ PO TBCR
40.0000 meq | EXTENDED_RELEASE_TABLET | Freq: Once | ORAL | Status: AC
  Administered 2020-05-30: 40 meq via ORAL
  Filled 2020-05-30: qty 2

## 2020-05-30 MED ORDER — MAGNESIUM SULFATE 4 GM/100ML IV SOLN
4.0000 g | Freq: Once | INTRAVENOUS | Status: AC
  Administered 2020-05-30: 4 g via INTRAVENOUS
  Filled 2020-05-30: qty 100

## 2020-05-30 MED ORDER — ACETAMINOPHEN 325 MG PO TABS
650.0000 mg | ORAL_TABLET | Freq: Four times a day (QID) | ORAL | Status: DC | PRN
  Administered 2020-05-30 – 2020-05-31 (×3): 650 mg
  Filled 2020-05-30 (×3): qty 2

## 2020-05-30 MED ORDER — PHENYLEPHRINE HCL-NACL 10-0.9 MG/250ML-% IV SOLN
INTRAVENOUS | Status: AC
  Administered 2020-05-30: 10 mg
  Filled 2020-05-30: qty 250

## 2020-05-30 MED ORDER — NOREPINEPHRINE 4 MG/250ML-% IV SOLN: 0.0000 ug/min | INTRAVENOUS | Status: DC

## 2020-05-30 MED ORDER — THIAMINE HCL 100 MG/ML IJ SOLN
100.0000 mg | Freq: Every day | INTRAMUSCULAR | Status: DC
  Administered 2020-06-01: 100 mg via INTRAVENOUS
  Filled 2020-05-30: qty 2

## 2020-05-30 MED ORDER — PREDNISONE 20 MG PO TABS
40.0000 mg | ORAL_TABLET | Freq: Every day | ORAL | Status: DC
  Filled 2020-05-30: qty 2

## 2020-05-30 MED ORDER — LAMOTRIGINE 100 MG PO TABS
100.0000 mg | ORAL_TABLET | Freq: Every day | ORAL | Status: DC
  Administered 2020-05-31 – 2020-06-02 (×3): 100 mg
  Filled 2020-05-30 (×3): qty 1

## 2020-05-30 MED ORDER — NOREPINEPHRINE 4 MG/250ML-% IV SOLN: 2.0000 ug/min | INTRAVENOUS | Status: DC

## 2020-05-30 MED ORDER — FLUOXETINE HCL 20 MG PO CAPS
20.0000 mg | ORAL_CAPSULE | Freq: Two times a day (BID) | ORAL | Status: DC
  Administered 2020-05-30 – 2020-06-02 (×7): 20 mg
  Filled 2020-05-30 (×6): qty 1

## 2020-05-30 MED ORDER — ARIPIPRAZOLE 10 MG PO TABS
30.0000 mg | ORAL_TABLET | Freq: Every day | ORAL | Status: DC
  Administered 2020-05-31 – 2020-06-02 (×3): 30 mg
  Filled 2020-05-30 (×4): qty 3

## 2020-05-30 MED ORDER — POLYETHYLENE GLYCOL 3350 17 G PO PACK
17.0000 g | PACK | Freq: Every day | ORAL | Status: DC
  Administered 2020-05-31 – 2020-06-01 (×2): 17 g
  Filled 2020-05-30 (×3): qty 1

## 2020-05-30 MED ORDER — CHLORHEXIDINE GLUCONATE 0.12% ORAL RINSE (MEDLINE KIT)
15.0000 mL | Freq: Two times a day (BID) | OROMUCOSAL | Status: DC
  Administered 2020-05-30 – 2020-06-03 (×8): 15 mL via OROMUCOSAL

## 2020-05-30 MED ORDER — MIDAZOLAM HCL 2 MG/2ML IJ SOLN
INTRAMUSCULAR | Status: AC
  Administered 2020-05-30: 2 mg
  Filled 2020-05-30: qty 4

## 2020-05-30 NOTE — Progress Notes (Signed)
Called into room by patient for a headache around 0500, pt having stated "started coughing and couldn't stop, now I can't catch my breath" at the time pt on 40L 100% high-flow, placed non-rebreather on top of high-flow at the time, oxygen saturation did not recover well still in 70's SPO2 Called respiratory and asked for Bi-pap, E-link also on at bedside, stat chest x-ray ordered. Bi-pap was placed on pt but sats still not recovering well and pt having increased work of breathing.  MD verbal order for intubation, anesthesia notified and placed pt on ventilator.

## 2020-05-30 NOTE — Progress Notes (Signed)
eLink Physician-Brief Progress Note Patient Name: [name] [name] DOB: 02-Jun-1960 MRN: [id number]   Date of Service  05/30/2020  HPI/Events of Note  Hypotension - SBP in 70's.   eICU Interventions  Plan: 1. 25% Albumin 12.5 gm IV now. 2. Phenylephrine IV infusion via PIV. Titrate to MAP >= 65.     Intervention Category Major Interventions: Hypotension - evaluation and management  [name] 05/30/2020, 7:00 AM

## 2020-05-30 NOTE — Progress Notes (Signed)
Chaplain engaged in an initial visit with Kassiah and her sister, Judeen Hammans.  Judeen Hammans shared that Loza is named after the character on the [place], while she is named after a Engineer, production with a puppet.   Judeen Hammans shared that her sister has a lot of support from family and friends because of her personality and the ways she loves on others.  She shared that Tahari is the one who keeps in contact with most of the family and creates opportunities for them all to get together.  She is the "life of the party."  Though they are from [place], Judeen Hammans noted how Aliece has been able to create her own family because of how she shows up for others.  She still has former coworkers that check on her often.  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  Judeen Hammans is also a Marine scientist and she still stays in [place].  Judeen Hammans also talked about Kyrra's healthcare journey and current battle with pneumonia.    Chaplain offered listening, support and presence.  Chaplain will follow-up.    05/30/20 1300  Clinical Encounter Type  Visited With Patient and family together  Visit Type Initial

## 2020-05-30 NOTE — Progress Notes (Signed)
K+ 3.4 Replaced per protocol  

## 2020-05-30 NOTE — Progress Notes (Signed)
NAME:  [name] [name], MRN:  [id number], DOB:  Apr 25, 1960, LOS: 1 ADMISSION DATE:  06/02/2020, CONSULTATION DATE:  06/14/2020 REFERRING MD:  Dr Florene Glen, CHIEF COMPLAINT:  persistent hypoxia  History of Present Illness:  [name] [name] is a 60 y.o. female with PMX significant for but not limited to seizures, right pulmonary nodule, HTN, HLD, prior ETOH abuse with resultant cirrhosis, CKD, anxiety, and depression who presented with continued complaints of progressive hypoxia. Of note patient was recently treated at this facility 3/23 - 4/1 for multifocal PNA with ARF secondary to hepatorenal syndrome. She was discharged on Augmentin. She states she did not feel completely back to baseline by discharge but she feels significantly worse now. In addition to SOB she also reports some chills, fatigue, persistent productive cough with thick yellow/green sputum, nausea, and questionable thrush.SOB is worsened by exertion and lying flat. States she missed one dose of Augmentin due to nausea. She denies any smoking or vaping since March of 2021.   On EMS arrival she was seen hypoxic at 62% on RA. On ED arrival oxygen sats 93% on 15L [place] with all other vitals WNL. She later became more hypoxic on NRB resulting in application of BIPAP in ED. Pertinent labs include; alkaline phos 146, total albumin 4.2, BNP 366, WBC 16.5, Hgb 9.9. Given high supplemental oxygen need and pulmonary history PCCM was consulted for further management.   Pertinent  Medical History   Past Medical History:  Diagnosis Date  . ABDOMINAL PAIN -GENERALIZED 09/27/2008   Qualifier: Diagnosis of  By: Trellis Paganini PA-c, Amy S   . Acute epigastric pain 04/27/2011  . Acute pancreatitis 08/12/2012  . Allergic rhinitis 09/05/2017  . Anal fistula   . Anemia   . Anxiety   . Arthritis   . BIPOLAR AFFECTIVE DISORDER 12/06/2006   Qualifier: History of  By: Elsworth Soho MD, Leanna Sato Bipolar disorder ([place])   . Chronic kidney disease   . COPD (chronic  obstructive pulmonary disease) ([place]) 11/08/2017  . Cough 12/06/2006   Annotation: chronic since'96, nml spirometry, +ve methacholine challenge, nml  CT sinuses, inconclusive pH probe, esophageal manometry Centricity Description: SYMPTOM, COUGH Qualifier: History of  By: Elsworth Soho MD, Leanna Sato   Centricity Description: COUGH Qualifier: Diagnosis of  By: Elsworth Soho MD, Leanna Sato    . Depressive disorder, not elsewhere classified   . Diarrhea 09/27/2008   Qualifier: Diagnosis of  By: Trellis Paganini PA-c, Amy S   . Diverticulosis of colon (without mention of hemorrhage)   . Elevated liver enzymes 10/31/2015  . Esophageal reflux   . ETOH abuse 08/14/2012  . Gastritis 04/28/2011  . GERD (gastroesophageal reflux disease)   . Hematochezia 08/14/2012  . Hemoptysis 05/26/2017  . HEMORRHOIDS 09/12/2007   Qualifier: Diagnosis of  By: Nils Pyle CMA ([place]), Mearl Latin    . Hepatic steatosis   . HIATAL HERNIA 09/12/2007   Qualifier: Diagnosis of  By: Nils Pyle CMA ([place]), Mearl Latin    . History of recurrent UTIs   . Hyperlipidemia   . Hypertension   . Hypertensive retinopathy of both eyes 11/24/2015  . INFECTIOUS DIARRHEA 09/27/2008   Qualifier: Diagnosis of  By: Laney Potash, Pam    . Iron deficiency   . Iron overload 09/22/2017  . Nausea alone 09/27/2008   Qualifier: Diagnosis of  By: Laney Potash, [place] 09/27/2008   Qualifier: Diagnosis of  By: Shane Crutch, Amy S   . Obesity, Class III, BMI 40-49.9 (morbid  obesity) ([place]) 04/27/2011  . Personal history of colonic polyps 10/16/2009   hyperplastic  . Polyarthralgia 12/02/2015   Evaluated by Dr. Charlestine Night (rheumatologist) 12/29/2015:1): 1)possible fibromyalgia (Suggest physical therapy and flexeril), 2) Polyarthralgia/Insomnia (suggest use of robaxin or flexeril), 3) Nicotine Dependence (counseled to quit). 4)Depression (continue therapy with psychiatrist)  . Pre-diabetes   . Pseudocyst of pancreas 08/12/2012  . Pulmonary nodule, right 11/29/2016  . Rectal fissure  05/11/2011  . Rectal fistula 05/11/2011  . RECTAL PAIN 09/12/2007   Qualifier: Diagnosis of  By: Nils Pyle CMA ([place]), Mearl Latin    . Secondary hemochromatosis 09/22/2017  . Seizures ([place])   . SIRS (systemic inflammatory response syndrome) ([place]) 03/11/2013  . Solitary rectal ulcer 04/12/2011  . TOBACCO ABUSE 12/06/2006   Qualifier: History of  By: Elsworth Soho MD, Leanna Sato.    . Ulcerative proctitis, nonspecific ([place]) 03/10/2011  . Varicose veins of both lower extremities 12/02/2015     Significant Hospital Events: Including procedures, antibiotic start and stop dates in addition to other pertinent events   . Admitted for persistent hypoxemia requiring BiPAP application . Decompensated overnight and intubated early 05/30/2020 . Central venous access placement 05/30/2020  Interim History / Subjective:  Worsening shortness of breath Persistent cough Persistent desaturations led to intubation  Objective   Blood pressure 125/87, pulse (!) 127, temperature 99.2 F (37.3 C), temperature source Axillary, resp. rate 18, height $RemoveBe'5\' 5"'[id number]$  (1.651 m), weight 98.5 kg, SpO2 92 %.    Vent Mode: PRVC FiO2 (%):  [91 %-100 %] 100 % Set Rate:  [18 bmp] 18 bmp Vt Set:  [500 mL] 500 mL PEEP:  [16 cmH20] 16 cmH20 Plateau Pressure:  [38 cmH20] 38 cmH20   Intake/Output Summary (Last 24 hours) at 05/30/2020 0827 Last data filed at 05/30/2020 1610 Gross per 24 hour  Intake 668.05 ml  Output 300 ml  Net 368.05 ml   Filed Weights   06/17/2020 1654  Weight: 98.5 kg    Examination: General: Middle-age lady, does not appear to be in distress, sedated HENT: Moist oral mucosa, endotracheal tube in place Lungs: Bilateral rhonchi Cardiovascular: S1-S2 appreciated Abdomen: Soft, bowel sounds appreciated Extremities: No clubbing, no edema Neuro: Sedated GU:   Labs/imaging that I havepersonally reviewed  (right click and "Reselect all SmartList Selections" daily)  Chest x-ray reviewed showing worsening of infiltrates.  Lines in  good placement MRSA PCR negative CBC, Chem-7 within normal limits  Resolved Hospital Problem list     Assessment & Plan:  Acute on chronic hypoxemic respiratory failure Continue mechanical ventilation Target TVol 6-8cc/kgIBW Target Plateau Pressure < 30cm H20 Target driving pressure less than 15 cm of water Target PaO2 55-65: titrate PEEP/FiO2 per protocol As long as PaO2 to FiO2 ratio is less than 1:150 position in prone position for 16 hours a day Check CVP daily if CVL in place Ventilator associated pneumonia prevention protocol  Pneumonia -Unclear whether it is an unresolved pneumonia or an interstitial pneumonitis -I did escalate antibiotics and will follow -Discontinue doxycycline and will continue cefepime at present Vancomycin discontinued -Tracheal aspirate for Gram stain and cultures  History of COPD -Continue bronchodilators  Nutrition -Start enteral nutrition  Agitation -Continue fentanyl and propofol as needed  Hypertension -Hold antihypertensives at present    Best practice (right click and "Reselect all SmartList Selections" daily)  Diet:  Tube Feed  Pain/Anxiety/Delirium protocol (if indicated): Yes (RASS goal -1) VAP protocol (if indicated): Yes DVT prophylaxis: LMWH GI prophylaxis: PPI Glucose control:  SSI Yes Central venous access:  Yes, and it is still needed Arterial line:  N/A Foley:  Yes, and it is still needed Mobility:  bed rest  PT consulted: N/A Last date of multidisciplinary goals of care discussion [pending] Code Status:  full code Disposition: icu  Labs   CBC: Recent Labs  Lab 05/25/20 1644 06/11/2020 1233 06/11/2020 1249 05/30/20 0243  WBC 8.1 16.5*  --  14.5*  NEUTROABS 6.2  --   --   --   HGB 9.3* 9.9* 11.2* 9.9*  HCT 28.0* 30.0* 33.0* 30.4*  MCV 108.5* 107.5*  --  109.0*  PLT 230 387  --  923    Basic Metabolic Panel: Recent Labs  Lab 05/25/20 1644 06/15/2020 1233 05/31/2020 1249 05/30/20 0243  NA 139 141 141 139   K 3.1* 3.8 3.9 3.4*  CL 102 105 102 105  CO2 26 23  --  24  GLUCOSE 131* 177* 170* 142*  BUN _0 CREATININE 0.92 0.77 0.70 0.88  CALCIUM 8.3* 8.4*  --  8.0*   GFR: Estimated Creatinine Clearance: 80 mL/min (by C-G formula based on SCr of 0.88 mg/dL). Recent Labs  Lab 05/25/20 1644 06/05/2020 1233 05/30/20 0243  PROCALCITON  --   --  0.81  WBC 8.1 16.5* 14.5*    Liver Function Tests: Recent Labs  Lab 05/25/20 1644 06/01/2020 1233  AST 134* 146*  ALT 40 43  ALKPHOS 153* 147*  BILITOT 4.7* 4.2*  PROT 6.8 6.8  ALBUMIN 3.0* 2.8*   Recent Labs  Lab 05/25/20 1644 06/06/2020 1547  LIPASE 25 22   Recent Labs  Lab 05/26/2020 1233  AMMONIA 35    ABG    Component Value Date/Time   TCO2 25 05/28/2020 1249     Coagulation Profile: Recent Labs  Lab 06/16/2020 1233  INR 1.6*    Cardiac Enzymes: No results for input(s): CKTOTAL, CKMB, CKMBINDEX, TROPONINI in the last 168 hours.  HbA1C: Hgb A1c MFr Bld  Date/Time Value Ref Range Status  03/07/2020 10:22 AM 5.9 4.6 - 6.5 % Final    Comment:    Glycemic Control Guidelines for People with Diabetes:Non Diabetic:  <6%Goal of Therapy: <7%Additional Action Suggested:  >8%   09/19/2019 10:29 AM 5.2 <5.7 % of total Hgb Final    Comment:    For the purpose of screening for the presence of diabetes: . <5.7%       Consistent with the absence of diabetes 5.7-6.4%    Consistent with increased risk for diabetes             (prediabetes) > or =6.5%  Consistent with diabetes . This assay result is consistent with a decreased risk of diabetes. . Currently, no consensus exists regarding use of hemoglobin A1c for diagnosis of diabetes in children. . According to American Diabetes Association (ADA) guidelines, hemoglobin A1c <7.0% represents optimal control in non-pregnant diabetic patients. Different metrics may apply to specific patient populations.  Standards of Medical Care in Diabetes(ADA). .     CBG: No  results for input(s): GLUCAP in the last 168 hours.  Review of Systems:   Intubated and sdated  Past Medical History:  She,  has a past medical history of ABDOMINAL PAIN -GENERALIZED (09/27/2008), Acute epigastric pain (04/27/2011), Acute pancreatitis (08/12/2012), Allergic rhinitis (09/05/2017), Anal fistula, Anemia, Anxiety, Arthritis, BIPOLAR AFFECTIVE DISORDER (12/06/2006), Bipolar disorder ([place]), Chronic kidney disease, COPD (chronic obstructive pulmonary disease) ([place]) (11/08/2017), Cough (12/06/2006), Depressive disorder, not elsewhere classified, Diarrhea (09/27/2008), Diverticulosis of colon (without mention  of hemorrhage), Elevated liver enzymes (10/31/2015), Esophageal reflux, ETOH abuse (08/14/2012), Gastritis (04/28/2011), GERD (gastroesophageal reflux disease), Hematochezia (08/14/2012), Hemoptysis (05/26/2017), HEMORRHOIDS (09/12/2007), Hepatic steatosis, HIATAL HERNIA (09/12/2007), History of recurrent UTIs, Hyperlipidemia, Hypertension, Hypertensive retinopathy of both eyes (11/24/2015), INFECTIOUS DIARRHEA (09/27/2008), Iron deficiency, Iron overload (09/22/2017), Nausea alone (09/27/2008), NAUSEA AND VOMITING (09/27/2008), Obesity, Class III, BMI 40-49.9 (morbid obesity) ([place]) (04/27/2011), Personal history of colonic polyps (10/16/2009), Polyarthralgia (12/02/2015), Pre-diabetes, Pseudocyst of pancreas (08/12/2012), Pulmonary nodule, right (11/29/2016), Rectal fissure (05/11/2011), Rectal fistula (05/11/2011), RECTAL PAIN (09/12/2007), Secondary hemochromatosis (09/22/2017), Seizures ([place]), SIRS (systemic inflammatory response syndrome) ([place]) (03/11/2013), Solitary rectal ulcer (04/12/2011), TOBACCO ABUSE (12/06/2006), Ulcerative proctitis, nonspecific ([place]) (03/10/2011), and Varicose veins of both lower extremities (12/02/2015).   Surgical History:   Past Surgical History:  Procedure Laterality Date  . FINGER AMPUTATION  2010   right index  . HEMORROIDECTOMY    . KNEE ARTHROSCOPY     bilateral, left x2  . TOTAL  ABDOMINAL HYSTERECTOMY    . VIDEO BRONCHOSCOPY Bilateral 05/31/2017   Procedure: VIDEO BRONCHOSCOPY WITHOUT FLUORO;  Surgeon: Collene Gobble, MD;  Location: [facility];  Service: Cardiopulmonary;  Laterality: Bilateral;     Social History:   reports that she quit smoking about 13 months ago. Her smoking use included cigars and cigarettes. She has a 11.50 pack-year smoking history. She has never used smokeless tobacco. She reports current alcohol use of about 1.0 standard drink of alcohol per week. She reports that she does not use drugs.   Family History:  Her family history includes Atrial fibrillation in her mother; Colon polyps in her brother; Diabetes in her father; Heart attack in her father; Hyperlipidemia in her father and sister; Hypertension in her father and mother; Irritable bowel syndrome in her mother; Ovarian cancer in her maternal aunt; Thyroid disease in her brother and sister. There is no history of Stomach cancer or Colon cancer.   Allergies No Known Allergies   Home Medications  Prior to Admission medications   Medication Sig Start Date End Date Taking? Authorizing Provider  albuterol (VENTOLIN HFA) 108 (90 Base) MCG/ACT inhaler Inhale 2 puffs into the lungs every 6 (six) hours as needed for wheezing or shortness of breath. 12/05/19  Yes Collene Gobble, MD  amoxicillin-clavulanate (AUGMENTIN) 875-125 MG tablet Take 1 tablet by mouth 2 (two) times daily. Start date : 05/23/20   Yes [provider]  ARIPiprazole (ABILIFY) 30 MG tablet Take 30 mg by mouth daily.  08/07/17  Yes [provider]  aspirin EC 81 MG tablet Take 1 tablet (81 mg total) by mouth daily. Swallow whole. 02/07/20  Yes Chandrasekhar, Mahesh A, MD  atorvastatin (LIPITOR) 40 MG tablet Take 2 tablets (80 mg total) by mouth daily. Patient taking differently: Take 40 mg by mouth daily. 02/12/20  Yes Chandrasekhar, Mahesh A, MD  B Complex-C (B-COMPLEX WITH VITAMIN C) tablet Take 1 tablet by  mouth daily.   Yes [provider]  dexlansoprazole (DEXILANT) 60 MG capsule Take 1 capsule (60 mg total) by mouth daily. Office visit for further refills Patient taking differently: Take 1 capsule by mouth daily. 07/24/19  Yes Irene Shipper, MD  diclofenac sodium (VOLTAREN) 1 % GEL Apply 2 g topically 4 (four) times daily as needed (pain). 10/24/15  Yes [provider]  fexofenadine (ALLEGRA) 180 MG tablet Take 1 tablet (180 mg total) by mouth daily. 05/23/20 08/21/20 Yes [place] Eric J, DO  FLUoxetine (PROZAC) 20 MG capsule Take 20 mg by mouth 2 (  two) times daily.  12/01/16  Yes [provider]  fluticasone (FLONASE) 50 MCG/ACT nasal spray SPRAY TWO SPRAYS IN EACH NOSTRIL ONCE DAILY Patient taking differently: Place 2 sprays into both nostrils at bedtime. 08/21/19  Yes [name], FNP  folic acid (FOLVITE) 1 MG tablet Take 1 tablet (1 mg total) by mouth daily. 03/07/20  Yes Janith Lima, MD  furosemide (LASIX) 40 MG tablet Take 1 tablet (40 mg total) by mouth 2 (two) times daily. 05/23/20 08/21/20 Yes [place] Donnamarie Poag, DO  HYDROcodone-acetaminophen (NORCO/VICODIN) 5-325 MG tablet Take 1 tablet by mouth every 6 (six) hours as needed for moderate pain.   Yes [provider]  lactulose (CHRONULAC) 10 GM/15ML solution Take 45 mLs (30 g total) by mouth 2 (two) times daily. 05/23/20 08/21/20 Yes [place] Eric J, DO  lamoTRIgine (LAMICTAL) 100 MG tablet Take 100 mg by mouth at bedtime.   Yes [provider]  montelukast (SINGULAIR) 10 MG tablet Take 1 tablet (10 mg total) by mouth at bedtime. 05/23/20 08/21/20 Yes [place] Eric J, DO  NARCAN 4 MG/0.1ML LIQD nasal spray kit Place 0.4 mg into the nose once as needed (overdose). 04/20/17  Yes [provider]  potassium chloride 20 MEQ TBCR Take 20 mEq by mouth daily. 05/25/20  Yes Dykstra, Ellwood Dense, MD  promethazine (PHENERGAN) 25 MG suppository INSERT ONE SUPPOSITORY RECTALLY EVERY 6 HOURS AS NEEDED FOR   NAUSEA/ VOMITING Patient taking differently: Place 25 mg rectally every 8 (eight) hours as needed for nausea or vomiting. 05/06/20  Yes Irene Shipper, MD  thiamine (VITAMIN B-1) 50 MG tablet Take 1 tablet (50 mg total) by mouth daily. 03/12/20  Yes Janith Lima, MD  Tiotropium Bromide Monohydrate (SPIRIVA RESPIMAT) 2.5 MCG/ACT AERS Inhale 2 puffs into the lungs daily. 12/25/19  Yes Martyn Ehrich, NP  Respiratory Therapy Supplies (FLUTTER) DEVI 3 puffs by Does not apply route 4 (four) times daily as needed. Needs to use device 20 min 3-4 times a day for coughing 07/05/19   Martyn Ehrich, NP     The patient is critically ill with multiple organ systems failure and requires high complexity decision making for assessment and support, frequent evaluation and titration of therapies, application of advanced monitoring technologies and extensive interpretation of multiple databases. Critical Care Time devoted to patient care services described in this note independent of APP/resident time (if applicable)  is 35 minutes.   Sherrilyn Rist MD [facility] Personal pager: [phone] If unanswered, please page CCM On-call: [phone]

## 2020-05-30 NOTE — Anesthesia Procedure Notes (Signed)
Procedure Name: Intubation Date/Time: 05/30/2020 6:15 AM Performed by: Cynda Familia, CRNA Pre-anesthesia Checklist: Patient identified, Emergency Drugs available, Suction available and Patient being monitored Patient Re-evaluated:Patient Re-evaluated prior to induction Oxygen Delivery Method: Circle System Utilized Preoxygenation: Pre-oxygenation with 100% oxygen Induction Type: IV induction Ventilation: Mask ventilation without difficulty Laryngoscope Size: Glidescope Tube type: Oral Tube size: 7.5 mm Number of attempts: 1 Airway Equipment and Method: Stylet Placement Confirmation: ETT inserted through vocal cords under direct vision,  positive ETCO2 and breath sounds checked- equal and bilateral Secured at: 22 cm Tube secured with: Tape Dental Injury: Teeth and Oropharynx as per pre-operative assessment  Comments: Smooth Iv induction [place]-- intubation AM CRNA atraumatic--teeth and mouth as preop--- poor dentition- chipping present on front teeth - bilat BS

## 2020-05-30 NOTE — Progress Notes (Signed)
Pharmacy Antibiotic Note  [name] [name] is a 60 y.o. female admitted on 06/20/2020 with pneumonia.  Pharmacy has been consulted for cefepime dosing. 05/30/2020  Intubated, on Neo @ 200 mcg/min> just hung Levo @ 2 mcg/min AF, WBC 16.5>14.5, PCT 0.81, SCr WNL Pharmacy to resume cefepime for PNA. MRSA PCR negative.  Plan: Cefepime 2 gm IV q8h F/u renal function, culture results & clinical course  Height: 5\' 5"  (165.1 cm) Weight: 98.5 kg (217 lb 2.5 oz) IBW/kg (Calculated) : 57  Temp (24hrs), Avg:98.5 F (36.9 C), Min:98.2 F (36.8 C), Max:99.2 F (37.3 C)  Recent Labs  Lab 05/25/20 1644 05/31/2020 1233 06/04/2020 1249 05/30/20 0243  WBC 8.1 16.5*  --  14.5*  CREATININE 0.92 0.77 0.70 0.88    Estimated Creatinine Clearance: 80 mL/min (by C-G formula based on SCr of 0.88 mg/dL).    No Known Allergies Antimicrobials this admission:  4/7 Cefepime >> 4/7 Vanc >> 4/8 4/7 Azith x 1 dose 4/7 doxy x 1 dose Dose adjustments this admission:   Microbiology results:  4/8 sputum: 4/7 MRSA neg 4/7 flu/covid neg  Thank you for allowing pharmacy to be a part of this patient's care.  Eudelia Bunch, Pharm.D 05/30/2020 8:46 AM

## 2020-05-30 NOTE — Procedures (Signed)
Central Venous Catheter Insertion Procedure Note  BENNETTA RUDDEN  [id number]  05/20/1960  Date:05/30/20  Time:8:23 AM   Provider Performing:Lakita Sahlin A Shianne Zeiser   Procedure: Insertion of Non-tunneled Central Venous Catheter(36556) with US guidance (36122)   Indication(s) Medication administration  Consent Unable to obtain consent due to emergent nature of procedure.  Anesthesia Topical only with 1% lidocaine   Timeout Verified patient identification, verified procedure, site/side was marked, verified correct patient position, special equipment/implants available, medications/allergies/relevant history reviewed, required imaging and test results available.  Sterile Technique Maximal sterile technique including full sterile barrier drape, hand hygiene, sterile gown, sterile gloves, mask, hair covering, sterile ultrasound probe cover (if used).  Procedure Description Area of catheter insertion was cleaned with chlorhexidine and draped in sterile fashion.  With real-time ultrasound guidance a central venous catheter was placed into the right internal jugular vein. Nonpulsatile blood flow and easy flushing noted in all ports.  The catheter was sutured in place and sterile dressing applied.  Complications/Tolerance None; patient tolerated the procedure well. Chest X-ray is ordered to verify placement for internal jugular or subclavian cannulation.   Chest x-ray is not ordered for femoral cannulation.  EBL Minimal  Specimen(s) None

## 2020-05-30 NOTE — Transfer of Care (Signed)
Immediate Anesthesia Transfer of Care Note  Patient: [name] [name]  Procedure(s) Performed: AN AD [place] INTUBATION  Patient Location: ICU  Anesthesia Type:General  Level of Consciousness: sedated  Airway & Oxygen Therapy: Patient placed on Ventilator (see vital sign flow sheet for setting)  Post-op Assessment: Report given to RN and Post -op Vital signs reviewed and stable  Post vital signs: Reviewed and stable  Last Vitals:  Vitals Value Taken Time  BP 125/87 05/30/20 0615  Temp    Pulse 115 05/30/20 0619  Resp 18 05/30/20 0619  SpO2 80 % 05/30/20 0619  Vitals shown include unvalidated device data.  Last Pain:  Vitals:   05/30/20 0400  TempSrc: Oral  PainSc:          Complications: No complications documented.

## 2020-05-30 NOTE — TOC Initial Note (Signed)
Transition of Care [facility]) - Initial/Assessment Note    Patient Details  Name: [name] [name] MRN: [id number] Date of Birth: [birth year]  Transition of Care [facility] CM/SW Contact:    Leeroy Cha, RN Phone Number: 05/30/2020, 8:29 AM  Clinical Narrative:                 Chief Complaint       Chief Complaint  Patient presents with  . Shortness of Breath  . Bloated      HPI:   This is a 60 year old female with past medical history of alcohol cirrhosis, COPD on 2 L nasal cannula since her recent hospitalization from 3/22-4/1 (treated for AKI secondary to hepatorenal syndrome and acute hypoxic respiratory failure secondary to multifocal pneumonia) who presented to the ED with worsening shortness of breath and persisting productive cough since her discharge. She was prescribed augmentin without any improvement, and actually symptoms worsened.  States that her O2 sat at home was down to 51% despite 5 L/min of her home O2 prompting her to contact EMS.  No sore throat or runny nose, fever or chills.  Denies chest pain but admits to epigastric pain and occasional wheezing.  Also admits to nausea and vomiting and has been having difficulty keeping her pills down for the past 24 hours, including her lasix.  States that she quit tobacco and vaping 1 year ago.   ED Course: Afebrile, tachypneic,  hemodynamically stable, hypoxic (SpO2 78% on 6 L/min) placed on 15 L/min followed by BiPAP for respiratory distress. Notable Labs: Glucose 177, alk phos 147, AST 146, ALT 43, T bili 4.2, BNP 366, WBC 16.5, INR 1.6, COVID-19 and flu negative. Notable Imaging: CXR-severe diffuse bilateral airspace disease with significant progression. Patient received albuterol, Atrovent, vancomycin, cefepime and azithromycin.  PLAN: weill follow for toc needs, patient unstable and intubated at this time.  Expected Discharge Plan: [place] Barriers to Discharge: Continued Medical Work up   Patient  Goals and CMS Choice Patient states their goals for this hospitalization and ongoing recovery are:: intubated at this time [id number]      Expected Discharge Plan and Services Expected Discharge Plan: [place]   Discharge Planning Services: CM Consult Post Acute Care Choice: [place] arrangements for the past 2 months: Single Family Home Expected Discharge Date:  (unknown)                           [place] Agency: [place]        Prior Living Arrangements/Services Living arrangements for the past 2 months: Single Family Home Lives with:: Self Patient language and need for interpreter reviewed:: Yes        Need for Family Participation in Patient Care: Yes (Comment) Care giver support system in place?: Yes (comment)   Criminal Activity/Legal Involvement Pertinent to Current Situation/Hospitalization: No - Comment as needed  Activities of Daily Living Home Assistive Devices/Equipment: Walker (specify type),Eyeglasses,Other (Comment) (front wheeled walker, tub/shower unit, standard height toilet) ADL Screening (condition at time of admission) Patient's cognitive ability adequate to safely complete daily activities?: No Is the patient deaf or have difficulty hearing?: No Does the patient have difficulty seeing, even when wearing glasses/contacts?: No Does the patient have difficulty concentrating, remembering, or making decisions?: Yes Patient able to express need for assistance with ADLs?: Yes Does the patient have difficulty dressing or bathing?: Yes Independently performs ADLs?: No Communication:  Independent Dressing (OT): Needs assistance Is this a change from baseline?: Change from baseline, expected to last >3 days Grooming: Needs assistance Is this a change from baseline?: Change from baseline, expected to last >3 days Feeding: Needs assistance Is this a change from baseline?: Change from baseline, expected to last >3  days Bathing: Needs assistance Is this a change from baseline?: Change from baseline, expected to last >3 days Toileting: Needs assistance Is this a change from baseline?: Change from baseline, expected to last >3days In/Out Bed: Needs assistance Is this a change from baseline?: Change from baseline, expected to last >3 days Walks in Home: Needs assistance Is this a change from baseline?: Change from baseline, expected to last >3 days Does the patient have difficulty walking or climbing stairs?: Yes (secondary to increased weakness) Weakness of Legs: Both Weakness of Arms/Hands: None  Permission Sought/Granted                  Emotional Assessment Appearance:: Appears stated age Attitude/Demeanor/Rapport: Intubated (Following Commands or Not Following Commands) Affect (typically observed): Unable to Assess Orientation: : Fluctuating Orientation (Suspected and/or reported Sundowners) Alcohol / Substance Use: Illicit Drugs,Tobacco Use Psych Involvement: No (comment)  Admission diagnosis:  Chronic anemia [D64.9] Acute respiratory failure with hypoxia (HCC) [J96.01] Bilateral pulmonary infiltrates on chest x-ray [R91.8] Acute on chronic respiratory failure with hypoxia (HCC) [J96.21] Other cirrhosis of liver ([place]) [K74.69] Patient Active Problem List   Diagnosis Date Noted  . Acute respiratory failure with hypoxia ([place]) 06/06/2020  . Acute liver failure without hepatic coma   . Cirrhosis ([place])   . Alcoholic cirrhosis of liver with ascites ([place])   . Alcohol use disorder, severe, dependence ([place])   . Bilirubinemia   . Anasarca 05/13/2020  . Anemia due to acquired thiamine deficiency 03/12/2020  . Cushingoid facies 03/06/2020  . Deficiency anemia 03/06/2020  . Palpitations 01/03/2020  . Chest pain of uncertain etiology 03/50/0938  . Aortic atherosclerosis ([place]) 01/03/2020  . NAFLD (nonalcoholic fatty liver disease) 01/03/2020  . Pain in left knee 06/12/2019  . COPD, mild  ([place]) 11/08/2017  . Iron overload 09/22/2017  . Secondary hemochromatosis 09/22/2017  . Allergic rhinitis 09/05/2017  . Hemoptysis 05/26/2017  . Pulmonary nodule, right 11/29/2016  . Polyarthralgia 12/02/2015  . Varicose veins of both lower extremities 12/02/2015  . Hypertensive retinopathy of both eyes 11/24/2015  . Elevated liver enzymes 10/31/2015  . Hyperglycemia 10/31/2015  . SIRS (systemic inflammatory response syndrome) ([place]) 03/11/2013  . Hypertension   . Hyperlipidemia   . Anemia   . Hematochezia 08/14/2012  . ETOH abuse 08/14/2012  . Pseudocyst of pancreas 08/12/2012  . GERD (gastroesophageal reflux disease) 06/08/2012  . Rectal fissure 05/11/2011  . Rectal fistula 05/11/2011  . Hiatal hernia 04/28/2011  . Gastritis 04/28/2011  . Nausea 04/27/2011  . Acute epigastric pain 04/27/2011  . Obesity, Class III, BMI 40-49.9 (morbid obesity) ([place]) 04/27/2011  . Solitary rectal ulcer 04/12/2011  . Hemorrhoids 03/10/2011  . Ulcerative proctitis, nonspecific ([place]) 03/10/2011  . DIVERTICULOSIS, COLON 10/22/2009  . COLONIC POLYPS, HYPERPLASTIC, HX OF 10/22/2009  . INFECTIOUS DIARRHEA 09/27/2008  . ULCERATIVE PROCTITIS 09/27/2008  . NAUSEA AND VOMITING 09/27/2008  . NAUSEA ALONE 09/27/2008  . DIARRHEA 09/27/2008  . ABDOMINAL PAIN -GENERALIZED 09/27/2008  . DEPRESSION 09/12/2007  . HEMORRHOIDS 09/12/2007  . Diaphragmatic hernia 09/12/2007  . ULCERATIVE COLITIS 09/12/2007  . RECTAL PAIN 09/12/2007  . BIPOLAR AFFECTIVE DISORDER 12/06/2006  . Current every day smoker 12/06/2006  . Esophageal reflux 12/06/2006  .  Cough 12/06/2006   PCP:  [name], [facility]:   [place] [address], [place] [address] [place] [address] Phone: [phone] Fax: [phone]     Social Determinants of Health (SDOH) Interventions    Readmission Risk Interventions No flowsheet data found.

## 2020-05-30 NOTE — Progress Notes (Addendum)
eLink Physician-Brief Progress Note Patient Name: [name] [name] DOB: 31-Jul-1960 MRN: [id number]   Date of Service  05/30/2020  HPI/Events of Note  Patient not improved with BiPAP. Sat = 66% and RR = 40's. Portable CXR reveals progression of severe bilateral interstitial pulmonary infiltrates.   eICU Interventions  Plan: 1. Will ask anesthesia to intubate the patient. 2. Ventilator settings: 100%/PRVC 18/TV 500/P 10. 3. ABG at 7:15 AM. 4. Portable CXR post intubation. 5. Propofol IV infusion. Titrate to RASS = 0 to -1.        [name] 05/30/2020, 6:00 AM

## 2020-05-30 NOTE — Progress Notes (Signed)
Initial Nutrition Assessment  DOCUMENTATION CODES:   Obesity unspecified  INTERVENTION:  - will order Vital High Protein @ 50 ml/hr with 45 ml Prosource TF once/day. - this regimen will provide 1240 kcal, 116 grams protein, and 1003 ml free water. - free water flush, if desired, to be per CCM.    NUTRITION DIAGNOSIS:   Inadequate oral intake related to inability to eat as evidenced by NPO status.  GOAL:   Provide needs based on ASPEN/SCCM guidelines  MONITOR:   Vent status,TF tolerance,Labs,Weight trends  REASON FOR ASSESSMENT:   Ventilator,Consult Enteral/tube feeding initiation and management  ASSESSMENT:   60 year old female with medical history of alcoholic cirrhosis, COPD on 2L nasal cannula since hospitalization 3/22-4/1 (treated for AKI 2/2 hepatorenal syndrome and acute hypoxic respiratory failure 2/2 multifocal PNA). She presented to the ED with worsening SOB and persistent, productive cough since discharge on 4/1. She reported O2 sats into the 50s despite 5L O2 at home. She reported N/V and difficulty keeping pills down x24 hours. She quit smoking and vaping 1 year ago.  Patient discussed in rounds this AM. Patient was intubated over night and remains intubated at this time. OGT placed earlier this afternoon (coiled in the stomach, per xray report).  Sister at bedside. She reports that since d/c on 4/1, patient has have very minimal nutrition intake d/t SOB, WOB, and nausea. Patient nods head in agreement to this. Sister states that appetite has been "below fair, probably bordering on poor".   Weight yesterday was 217 lb and weight on 3/31 was 223 lb. This indicates 6 lb weight loss (2.7% body weight) in the past 1 week; significant for time frame.   Weight 12/25/19-03/05/20 was consistent with weight yesterday and weight on 3/31.   Per notes: - acute on chronic hypoxemic respiratory failure - PNA   Patient is currently intubated on ventilator support MV: 12.8  L/min Temp (24hrs), Avg:98.6 F (37 C), Min:98.2 F (36.8 C), Max:99.2 F (37.3 C) Propofol: none BP: 93/55  Labs reviewed; CBG: 118 mg/dl, K: 3.4 mmol/l, Ca: 8 mg/dl, Mg: 1.3 mg/dl. Medications reviewed; 100 mg colace BID, 1 mg folvite/day, 40 mg IV lasix BID, 30 g lactulose BID, 4 g IV Mg sulfate x1 run 4/8, 1 tablet multivitamin with minerals/day, 40 mg protonix/day, 17 g miralax/day, 10 mEq IV KCl x4 runs 4/8, 40 mEq Klor-Con x1 dose 4/8, 100 mg thiamine/day, 40 mg deltasone/day.  Drips; levo @ 17 mcg/min, fentanyl @ 100 mcg/h, precedex @ 0.5 mcg/kg/h.     NUTRITION - FOCUSED PHYSICAL EXAM:  Flowsheet Row Most Recent Value  Orbital Region No depletion  Upper Arm Region No depletion  Thoracic and Lumbar Region Unable to assess  Buccal Region No depletion  Temple Region No depletion  Clavicle Bone Region No depletion  Clavicle and Acromion Bone Region No depletion  Scapular Bone Region Unable to assess  Dorsal Hand No depletion  Patellar Region Mild depletion  Anterior Thigh Region No depletion  Posterior Calf Region Mild depletion  Edema (RD Assessment) Mild  [BLE]  Hair Reviewed  Eyes Unable to assess  Mouth Unable to assess  Skin Reviewed  Nails Reviewed       Diet Order:   Diet Order            Diet heart healthy/carb modified Room service appropriate? Yes; Fluid consistency: Thin  Diet effective now                 EDUCATION NEEDS:  No education needs have been identified at this time  Skin:  Skin Assessment: Reviewed RN Assessment  Last BM:  4/7 (type 7 x1)  Height:   Ht Readings from Last 1 Encounters:  06/19/2020 5\' 5"  (1.651 m)    Weight:   Wt Readings from Last 1 Encounters:  06/12/2020 98.5 kg     Estimated Nutritional Needs:  Kcal:  1083-1400 kcal Protein:  >/= 115 grams Fluid:  >/= 1.5 L/day      Jarome Matin, MS, RD, LDN, CNSC Inpatient Clinical Dietitian RD pager # available in [facility]  After hours/weekend pager #  available in [facility]

## 2020-05-30 NOTE — Progress Notes (Signed)
eLink Physician-Brief Progress Note Patient Name: [name] [name] DOB: 1960/05/03 MRN: [id number]   Date of Service  05/30/2020  HPI/Events of Note  Hypoxia - Sat dropped to 38% on 40 L/min HHFNC + NRB. Patient placed on BiPAP. Sats coming up slowly. Now = 65%.   eICU Interventions  Plan: 1. Continue BiPAP. 2. Portable CXR STAT.     Intervention Category Major Interventions: Hypoxemia - evaluation and management  Jonetta Dagley Eugene 05/30/2020, 5:40 AM

## 2020-05-30 NOTE — Progress Notes (Signed)
Patient intubated overnight.  PCCM now primary.  Will sign off, please let us know when we can be of further assistance.

## 2020-05-31 ENCOUNTER — Inpatient Hospital Stay (HOSPITAL_COMMUNITY): Payer: PPO

## 2020-05-31 DIAGNOSIS — J9601 Acute respiratory failure with hypoxia: Secondary | ICD-10-CM | POA: Diagnosis not present

## 2020-05-31 LAB — CBC
HCT: 29.7 % — ABNORMAL LOW (ref 36.0–46.0)
Hemoglobin: 9.6 g/dL — ABNORMAL LOW (ref 12.0–15.0)
MCH: 35.2 pg — ABNORMAL HIGH (ref 26.0–34.0)
MCHC: 32.3 g/dL (ref 30.0–36.0)
MCV: 108.8 fL — ABNORMAL HIGH (ref 80.0–100.0)
Platelets: 532 10*3/uL — ABNORMAL HIGH (ref 150–400)
RBC: 2.73 MIL/uL — ABNORMAL LOW (ref 3.87–5.11)
RDW: 15.7 % — ABNORMAL HIGH (ref 11.5–15.5)
WBC: 17.6 10*3/uL — ABNORMAL HIGH (ref 4.0–10.5)
nRBC: 0.1 % (ref 0.0–0.2)

## 2020-05-31 LAB — URINALYSIS, ROUTINE W REFLEX MICROSCOPIC
Bacteria, UA: NONE SEEN
Glucose, UA: NEGATIVE mg/dL
Ketones, ur: 5 mg/dL — AB
Nitrite: NEGATIVE
Protein, ur: 30 mg/dL — AB
RBC / HPF: >50 RBC/hpf — ABNORMAL HIGH (ref 0–5)
Specific Gravity, Urine: 1.026 (ref 1.005–1.030)
WBC, UA: >50 WBC/hpf — ABNORMAL HIGH (ref 0–5)
pH: 5 (ref 5.0–8.0)

## 2020-05-31 LAB — GLUCOSE, CAPILLARY
Glucose-Capillary: 230 mg/dL — ABNORMAL HIGH (ref 70–99)
Glucose-Capillary: 190 mg/dL — ABNORMAL HIGH (ref 70–99)
Glucose-Capillary: 220 mg/dL — ABNORMAL HIGH (ref 70–99)
Glucose-Capillary: 200 mg/dL — ABNORMAL HIGH (ref 70–99)
Glucose-Capillary: 251 mg/dL — ABNORMAL HIGH (ref 70–99)
Glucose-Capillary: 210 mg/dL — ABNORMAL HIGH (ref 70–99)

## 2020-05-31 LAB — PHOSPHORUS: Phosphorus: 2.4 mg/dL — ABNORMAL LOW (ref 2.5–4.6)

## 2020-05-31 LAB — MAGNESIUM: Magnesium: 2.3 mg/dL (ref 1.7–2.4)

## 2020-05-31 LAB — BASIC METABOLIC PANEL
Anion gap: 10 (ref 5–15)
BUN: 25 mg/dL — ABNORMAL HIGH (ref 6–20)
CO2: 21 mmol/L — ABNORMAL LOW (ref 22–32)
Calcium: 7.7 mg/dL — ABNORMAL LOW (ref 8.9–10.3)
Chloride: 103 mmol/L (ref 98–111)
Creatinine, Ser: 1.42 mg/dL — ABNORMAL HIGH (ref 0.44–1.00)
GFR, Estimated: 43 mL/min — ABNORMAL LOW (ref >60)
Glucose, Bld: 224 mg/dL — ABNORMAL HIGH (ref 70–99)
Potassium: 4.4 mmol/L (ref 3.5–5.1)
Sodium: 134 mmol/L — ABNORMAL LOW (ref 135–145)

## 2020-05-31 LAB — HEMOGLOBIN A1C
Hgb A1c MFr Bld: 4.6 % — ABNORMAL LOW (ref 4.8–5.6)
Mean Plasma Glucose: 85.32 mg/dL

## 2020-05-31 LAB — SODIUM, URINE, RANDOM: Sodium, Ur: <10 mmol/L

## 2020-05-31 LAB — TRIGLYCERIDES: Triglycerides: 147 mg/dL (ref <150)

## 2020-05-31 LAB — PROCALCITONIN: Procalcitonin: 4.61 ng/mL

## 2020-05-31 MED ORDER — FENTANYL BOLUS VIA INFUSION
200.0000 ug | INTRAVENOUS | Status: DC | PRN
  Administered 2020-05-31: 50 ug via INTRAVENOUS
  Administered 2020-06-01 – 2020-06-02 (×5): 200 ug via INTRAVENOUS
  Filled 2020-05-31: qty 200

## 2020-05-31 MED ORDER — SODIUM CHLORIDE 0.9% FLUSH
10.0000 mL | Freq: Two times a day (BID) | INTRAVENOUS | Status: DC
  Administered 2020-05-31: 10 mL
  Administered 2020-05-31: 30 mL
  Administered 2020-06-01 – 2020-06-02 (×3): 10 mL

## 2020-05-31 MED ORDER — SODIUM CHLORIDE 0.9 % IV SOLN
2.0000 g | Freq: Two times a day (BID) | INTRAVENOUS | Status: DC
  Administered 2020-05-31 – 2020-06-02 (×5): 2 g via INTRAVENOUS
  Filled 2020-05-31 (×5): qty 2

## 2020-05-31 MED ORDER — LORAZEPAM 2 MG/ML IJ SOLN
2.0000 mg | INTRAMUSCULAR | Status: DC | PRN
  Administered 2020-05-31 – 2020-06-02 (×6): 2 mg via INTRAVENOUS
  Filled 2020-05-31 (×7): qty 1

## 2020-05-31 MED ORDER — VANCOMYCIN HCL 1000 MG/200ML IV SOLN: 1000.0000 mg | Freq: Once | INTRAVENOUS | Status: DC

## 2020-05-31 MED ORDER — VANCOMYCIN HCL 1000 MG/200ML IV SOLN
1000.0000 mg | INTRAVENOUS | Status: DC
  Administered 2020-06-01 – 2020-06-02 (×2): 1000 mg via INTRAVENOUS
  Filled 2020-05-31 (×2): qty 200

## 2020-05-31 MED ORDER — VANCOMYCIN HCL 2000 MG/400ML IV SOLN
2000.0000 mg | Freq: Once | INTRAVENOUS | Status: AC
  Administered 2020-05-31: 2000 mg via INTRAVENOUS
  Filled 2020-05-31: qty 400

## 2020-05-31 MED ORDER — INSULIN ASPART 100 UNIT/ML ~~LOC~~ SOLN
0.0000 [IU] | SUBCUTANEOUS | Status: DC
  Administered 2020-05-31: 5 [IU] via SUBCUTANEOUS
  Administered 2020-05-31: 3 [IU] via SUBCUTANEOUS
  Administered 2020-05-31: 2 [IU] via SUBCUTANEOUS
  Administered 2020-05-31 (×2): 3 [IU] via SUBCUTANEOUS
  Administered 2020-05-31: 2 [IU] via SUBCUTANEOUS
  Administered 2020-06-01 (×3): 3 [IU] via SUBCUTANEOUS
  Administered 2020-06-01: 2 [IU] via SUBCUTANEOUS
  Administered 2020-06-01: 3 [IU] via SUBCUTANEOUS
  Administered 2020-06-02: 1 [IU] via SUBCUTANEOUS
  Administered 2020-06-02 (×3): 2 [IU] via SUBCUTANEOUS
  Administered 2020-06-02: 1 [IU] via SUBCUTANEOUS
  Administered 2020-06-03 (×2): 2 [IU] via SUBCUTANEOUS

## 2020-05-31 MED ORDER — VASOPRESSIN 20 UNITS/100 ML INFUSION FOR SHOCK
0.0300 [IU]/min | INTRAVENOUS | Status: DC
  Administered 2020-05-31 – 2020-06-03 (×7): 0.03 [IU]/min via INTRAVENOUS
  Filled 2020-05-31 (×10): qty 100

## 2020-05-31 MED ORDER — SODIUM CHLORIDE 0.9% FLUSH: 10.0000 mL | INTRAVENOUS | Status: DC | PRN

## 2020-05-31 NOTE — Significant Event (Signed)
Met with sister at bedside. She is an oncology RN. Explained grave situation, septic shock, respiratory failure, renal failure. No clear reversible cause or source. Not a dialysis candidate given not a transplant candidate (EtOH cirrhosis). Sister understands how ill she is. Does not want prolonged life support. To re-address tomorrow. She does not want to prolong suffering. Agreed resuscitative efforts would likely only harm not help, code changed to DNR.

## 2020-05-31 NOTE — Progress Notes (Signed)
eLink Physician-Brief Progress Note Patient Name: [name] [name] DOB: [birth year] MRN: [id number]   Date of Service  05/31/2020  HPI/Events of Note  CBGs increasing, now in 200s. Patient is on tube feeds. Not on insulin sliding scale yet.   eICU Interventions  Started sensitive-scale ISS.     Intervention Category Intermediate Interventions: Hyperglycemia - evaluation and treatment  Charlott Rakes 05/31/2020, 3:59 AM

## 2020-05-31 NOTE — Progress Notes (Addendum)
Pharmacy Antibiotic Note  [name] [name] is a 60 y.o. female admitted on 06/09/2020 with pneumonia.  Pharmacy has been consulted for cefepime dosing. 05/31/2020  Intubated, Levo @ 13 mcg/min, to add vasopressin Tmax 102.4, WBC 16.5>14.>17.6, PCT 0.81> 4.61, SCr 0.88> 1.42, BCx ordered   Plan: Decrease Cefepime to 2 gm IV q12h for CrCl ~ 50 ml/min F/u renal function, culture results & clinical course  Height: 5\' 5"  (165.1 cm) Weight: 98.5 kg (217 lb 2.5 oz) IBW/kg (Calculated) : 57  Temp (24hrs), Avg:100.7 F (38.2 C), Min:98.8 F (37.1 C), Max:102.4 F (39.1 C)  Recent Labs  Lab 05/25/20 1644 05/25/2020 1233 05/25/2020 1249 05/30/20 0243 05/31/20 0230  WBC 8.1 16.5*  --  14.5* 17.6*  CREATININE 0.92 0.77 0.70 0.88 1.42*    Estimated Creatinine Clearance: 49.6 mL/min (A) (by C-G formula based on SCr of 1.42 mg/dL (H)).    No Known Allergies  Antimicrobials this admission:  4/7 Cefepime >> 4/7 Vanc >> 4/8 4/7 Azith x 1 dose 4/7 doxy x 1 dose Dose adjustments this admission:  4/9 Cefepime decr q12 Microbiology results:  4/8 sputum: gram stain neg, cx ip 4/7 MRSA neg 4/7 flu/covid neg 4/9 BCx2: ordered 4/7 UA: ordered,not yet collected  Thank you for allowing pharmacy to be a part of this patient's care.  Eudelia Bunch, Pharm.D 05/31/2020 8:20 AM   Addendum: to add vancomycin  Plan: Vanc 2 gm x 1 then 1 gm q24 for AUC 541.8 33.8/14.7 using SCr 1.42, Vd 0.5

## 2020-05-31 NOTE — Progress Notes (Signed)
NAME:  [name] [name], MRN:  [id number], DOB:  Jun 08, 1960, LOS: 2 ADMISSION DATE:  06/14/2020, CONSULTATION DATE:  05/31/2020 REFERRING MD:  Dr Florene Glen, CHIEF COMPLAINT:  persistent hypoxia  History of Present Illness:  [name] [name] is a 60 y.o. female with PMX significant for but not limited to seizures, right pulmonary nodule, HTN, HLD, prior ETOH abuse with resultant cirrhosis, CKD, anxiety, and depression who presented with continued complaints of progressive hypoxia. Of note patient was recently treated at this facility 3/23 - 4/1 for multifocal PNA with ARF secondary to hepatorenal syndrome. She was discharged on Augmentin. She states she did not feel completely back to baseline by discharge but she feels significantly worse now. In addition to SOB she also reports some chills, fatigue, persistent productive cough with thick yellow/green sputum, nausea, and questionable thrush.SOB is worsened by exertion and lying flat. States she missed one dose of Augmentin due to nausea. She denies any smoking or vaping since March of 2021.   On EMS arrival she was seen hypoxic at 62% on RA. On ED arrival oxygen sats 93% on 15L [place] with all other vitals WNL. She later became more hypoxic on NRB resulting in application of BIPAP in ED. Pertinent labs include; alkaline phos 146, total albumin 4.2, BNP 366, WBC 16.5, Hgb 9.9. Given high supplemental oxygen need and pulmonary history PCCM was consulted for further management.   Pertinent  Medical History   Past Medical History:  Diagnosis Date  . ABDOMINAL PAIN -GENERALIZED 09/27/2008   Qualifier: Diagnosis of  By: Trellis Paganini PA-c, Amy S   . Acute epigastric pain 04/27/2011  . Acute pancreatitis 08/12/2012  . Allergic rhinitis 09/05/2017  . Anal fistula   . Anemia   . Anxiety   . Arthritis   . BIPOLAR AFFECTIVE DISORDER 12/06/2006   Qualifier: History of  By: Elsworth Soho MD, Leanna Sato Bipolar disorder ([place])   . Chronic kidney disease   . COPD (chronic  obstructive pulmonary disease) ([place]) 11/08/2017  . Cough 12/06/2006   Annotation: chronic since'96, nml spirometry, +ve methacholine challenge, nml  CT sinuses, inconclusive pH probe, esophageal manometry Centricity Description: SYMPTOM, COUGH Qualifier: History of  By: Elsworth Soho MD, Leanna Sato   Centricity Description: COUGH Qualifier: Diagnosis of  By: Elsworth Soho MD, Leanna Sato    . Depressive disorder, not elsewhere classified   . Diarrhea 09/27/2008   Qualifier: Diagnosis of  By: Trellis Paganini PA-c, Amy S   . Diverticulosis of colon (without mention of hemorrhage)   . Elevated liver enzymes 10/31/2015  . Esophageal reflux   . ETOH abuse 08/14/2012  . Gastritis 04/28/2011  . GERD (gastroesophageal reflux disease)   . Hematochezia 08/14/2012  . Hemoptysis 05/26/2017  . HEMORRHOIDS 09/12/2007   Qualifier: Diagnosis of  By: Nils Pyle CMA ([place]), Mearl Latin    . Hepatic steatosis   . HIATAL HERNIA 09/12/2007   Qualifier: Diagnosis of  By: Nils Pyle CMA ([place]), Mearl Latin    . History of recurrent UTIs   . Hyperlipidemia   . Hypertension   . Hypertensive retinopathy of both eyes 11/24/2015  . INFECTIOUS DIARRHEA 09/27/2008   Qualifier: Diagnosis of  By: Laney Potash, Pam    . Iron deficiency   . Iron overload 09/22/2017  . Nausea alone 09/27/2008   Qualifier: Diagnosis of  By: Laney Potash, [place] 09/27/2008   Qualifier: Diagnosis of  By: Shane Crutch, Amy S   . Obesity, Class III, BMI 40-49.9 (morbid  obesity) ([place]) 04/27/2011  . Personal history of colonic polyps 10/16/2009   hyperplastic  . Polyarthralgia 12/02/2015   Evaluated by Dr. Charlestine Night (rheumatologist) 12/29/2015:1): 1)possible fibromyalgia (Suggest physical therapy and flexeril), 2) Polyarthralgia/Insomnia (suggest use of robaxin or flexeril), 3) Nicotine Dependence (counseled to quit). 4)Depression (continue therapy with psychiatrist)  . Pre-diabetes   . Pseudocyst of pancreas 08/12/2012  . Pulmonary nodule, right 11/29/2016  . Rectal fissure  05/11/2011  . Rectal fistula 05/11/2011  . RECTAL PAIN 09/12/2007   Qualifier: Diagnosis of  By: Nils Pyle CMA ([place]), Mearl Latin    . Secondary hemochromatosis 09/22/2017  . Seizures ([place])   . SIRS (systemic inflammatory response syndrome) ([place]) 03/11/2013  . Solitary rectal ulcer 04/12/2011  . TOBACCO ABUSE 12/06/2006   Qualifier: History of  By: Elsworth Soho MD, Leanna Sato.    . Ulcerative proctitis, nonspecific ([place]) 03/10/2011  . Varicose veins of both lower extremities 12/02/2015     Significant Hospital Events: Including procedures, antibiotic start and stop dates in addition to other pertinent events   . Admitted for persistent hypoxemia requiring BiPAP application . Decompensated overnight and intubated early 05/30/2020 . Central venous access placement 05/30/2020 . 4/9 new fever, added vanc back to cefepime, CXR looks wet as does CT lung portion of CT AP  Interim History / Subjective:  New fever, still on NE, Bps fluctuating, more agitated overnight, responded to fentanyl escalation   Objective   Blood pressure (!) 165/112, pulse 100, temperature (!) 102.4 F (39.1 C), temperature source Axillary, resp. rate (!) 29, height _0  (1.651 m), weight 98.5 kg, SpO2 (!) 88 %. CVP:  [7 mmHg-20 mmHg] 13 mmHg  Vent Mode: PRVC FiO2 (%):  [80 %-100 %] 80 % Set Rate:  [24 bmp] 24 bmp Vt Set:  [500 mL] 500 mL PEEP:  [18 cmH20] 18 cmH20 Plateau Pressure:  [29 cmH20-37 cmH20] 29 cmH20   Intake/Output Summary (Last 24 hours) at 05/31/2020 9629 Last data filed at 05/31/2020 0700 Gross per 24 hour  Intake 3470.85 ml  Output 575 ml  Net 2895.85 ml   Filed Weights   06/20/2020 1654  Weight: 98.5 kg    Examination: General: Middle-age lady, does not appear to be in distress, sedated HENT: Moist oral mucosa, endotracheal tube in place Lungs: Bilateral rhonchi Cardiovascular: S1-S2 appreciated, warm Abdomen: Soft, bowel sounds appreciated Extremities: No clubbing, no edema Neuro: Sedated GU:   Labs/imaging  that I havepersonally reviewed    Chest x-ray reviewed showing worsening of infiltrates.  Lines in good placement MRSA PCR negative CBC, Chem-7 within normal limits  Resolved Hospital Problem list     Assessment & Plan:  Acute on chronic hypoxemic respiratory failure Continue mechanical ventilation Target TVol 6-8cc/kgIBW Target Plateau Pressure < 30cm H20 Target driving pressure less than 15 cm of water Target PaO2 55-65: titrate PEEP/FiO2 per protocol As long as PaO2 to FiO2 ratio is less than 1:150 position in prone position for 16 hours a day Check CVP daily if CVL in place Ventilator associated pneumonia prevention protocol  Septic Shock: febrile, tachycardic, renal dysfunction, on vasopressors. Lung source? But new fever and think lungs represent edema --UA, blood cultures ordered --No ascites on bedside US amenable to diagnostic tap  AKI: Rising Cr. Possible HRS vs ATN from hypotension/shock or both vs elevated venous pressures. --CVP flat 15 --FiO2 weaning, hold diuresis today concern septic shock --Urine Na --Poor dialysis candidate  Chest infiltrates: Most consistent with pulmonary edema, more febrile, vanc added back 4/9. SBP?  -cefepime,  vanc - f/u respiratory cultures (no organisms) --Consider BAL  History of COPD -Continue bronchodilators  Nutrition: hold enteral nutrition with escalating pressors  Agitation -Continue fentanyl gtt, drip, ativan PRN --Wean off precedex (hypotension), propofol caused hypotension  Hypertension -Hold antihypertensives at present    Best practice (right click and "Reselect all SmartList Selections" daily)  Diet:  Tube Feed  Pain/Anxiety/Delirium protocol (if indicated): Yes (RASS goal -1) VAP protocol (if indicated): Yes DVT prophylaxis: LMWH GI prophylaxis: PPI Glucose control:  SSI Yes Central venous access:  Yes, and it is still needed Arterial line:  N/A Foley:  Yes, and it is still needed Mobility:  bed rest   PT consulted: N/A Last date of multidisciplinary goals of care discussion [pending] Code Status:  full code Disposition: icu  Labs   Reviewed and as per EMR  CRITICAL CARE Performed by: Bonna Gains Fantashia Shupert   Total critical care time: 45  minutes  Critical care time was exclusive of separately billable procedures and treating other patients.  Critical care was necessary to treat or prevent imminent or life-threatening deterioration.  Critical care was time spent personally by me on the following activities: development of treatment plan with patient and/or surrogate as well as nursing, discussions with consultants, evaluation of patient's response to treatment, examination of patient, obtaining history from patient or surrogate, ordering and performing treatments and interventions, ordering and review of laboratory studies, ordering and review of radiographic studies, pulse oximetry and re-evaluation of patient's condition.

## 2020-06-01 ENCOUNTER — Inpatient Hospital Stay (HOSPITAL_COMMUNITY): Payer: PPO

## 2020-06-01 DIAGNOSIS — J9601 Acute respiratory failure with hypoxia: Secondary | ICD-10-CM | POA: Diagnosis not present

## 2020-06-01 LAB — BASIC METABOLIC PANEL
Anion gap: 10 (ref 5–15)
BUN: 35 mg/dL — ABNORMAL HIGH (ref 6–20)
CO2: 21 mmol/L — ABNORMAL LOW (ref 22–32)
Calcium: 8 mg/dL — ABNORMAL LOW (ref 8.9–10.3)
Chloride: 104 mmol/L (ref 98–111)
Creatinine, Ser: 1.27 mg/dL — ABNORMAL HIGH (ref 0.44–1.00)
GFR, Estimated: 49 mL/min — ABNORMAL LOW (ref >60)
Glucose, Bld: 235 mg/dL — ABNORMAL HIGH (ref 70–99)
Potassium: 3.8 mmol/L (ref 3.5–5.1)
Sodium: 135 mmol/L (ref 135–145)

## 2020-06-01 LAB — CBC
HCT: 26.9 % — ABNORMAL LOW (ref 36.0–46.0)
Hemoglobin: 8.7 g/dL — ABNORMAL LOW (ref 12.0–15.0)
MCH: 35.2 pg — ABNORMAL HIGH (ref 26.0–34.0)
MCHC: 32.3 g/dL (ref 30.0–36.0)
MCV: 108.9 fL — ABNORMAL HIGH (ref 80.0–100.0)
Platelets: 386 10*3/uL (ref 150–400)
RBC: 2.47 MIL/uL — ABNORMAL LOW (ref 3.87–5.11)
RDW: 15.8 % — ABNORMAL HIGH (ref 11.5–15.5)
WBC: 12.7 10*3/uL — ABNORMAL HIGH (ref 4.0–10.5)
nRBC: 0 % (ref 0.0–0.2)

## 2020-06-01 LAB — GLUCOSE, CAPILLARY
Glucose-Capillary: 201 mg/dL — ABNORMAL HIGH (ref 70–99)
Glucose-Capillary: 210 mg/dL — ABNORMAL HIGH (ref 70–99)
Glucose-Capillary: 250 mg/dL — ABNORMAL HIGH (ref 70–99)
Glucose-Capillary: 212 mg/dL — ABNORMAL HIGH (ref 70–99)
Glucose-Capillary: 189 mg/dL — ABNORMAL HIGH (ref 70–99)
Glucose-Capillary: 197 mg/dL — ABNORMAL HIGH (ref 70–99)

## 2020-06-01 MED ORDER — INSULIN GLARGINE 100 UNIT/ML ~~LOC~~ SOLN: 10.0000 [IU] | Freq: Every day | SUBCUTANEOUS | Status: DC

## 2020-06-01 NOTE — Progress Notes (Signed)
NAME:  [name] [name], MRN:  [id number], DOB:  25-Feb-1960, LOS: 3 ADMISSION DATE:  06/04/2020, CONSULTATION DATE:  06/04/2020 REFERRING MD:  Dr Florene Glen, CHIEF COMPLAINT:  persistent hypoxia  History of Present Illness:  [name] [name] is a 60 y.o. female with PMX significant for but not limited to seizures, right pulmonary nodule, HTN, HLD, prior ETOH abuse with resultant cirrhosis, CKD, anxiety, and depression who presented with continued complaints of progressive hypoxia. Of note patient was recently treated at this facility 3/23 - 4/1 for multifocal PNA with ARF secondary to hepatorenal syndrome. She was discharged on Augmentin. She states she did not feel completely back to baseline by discharge but she feels significantly worse now. In addition to SOB she also reports some chills, fatigue, persistent productive cough with thick yellow/green sputum, nausea, and questionable thrush.SOB is worsened by exertion and lying flat. States she missed one dose of Augmentin due to nausea. She denies any smoking or vaping since March of 2021.   On EMS arrival she was seen hypoxic at 62% on RA. On ED arrival oxygen sats 93% on 15L [place] with all other vitals WNL. She later became more hypoxic on NRB resulting in application of BIPAP in ED. Pertinent labs include; alkaline phos 146, total albumin 4.2, BNP 366, WBC 16.5, Hgb 9.9. Given high supplemental oxygen need and pulmonary history PCCM was consulted for further management.   Pertinent  Medical History   Past Medical History:  Diagnosis Date  . ABDOMINAL PAIN -GENERALIZED 09/27/2008   Qualifier: Diagnosis of  By: Trellis Paganini PA-c, Amy S   . Acute epigastric pain 04/27/2011  . Acute pancreatitis 08/12/2012  . Allergic rhinitis 09/05/2017  . Anal fistula   . Anemia   . Anxiety   . Arthritis   . BIPOLAR AFFECTIVE DISORDER 12/06/2006   Qualifier: History of  By: Elsworth Soho MD, Leanna Sato Bipolar disorder ([place])   . Chronic kidney disease   . COPD (chronic  obstructive pulmonary disease) ([place]) 11/08/2017  . Cough 12/06/2006   Annotation: chronic since'96, nml spirometry, +ve methacholine challenge, nml  CT sinuses, inconclusive pH probe, esophageal manometry Centricity Description: SYMPTOM, COUGH Qualifier: History of  By: Elsworth Soho MD, Leanna Sato   Centricity Description: COUGH Qualifier: Diagnosis of  By: Elsworth Soho MD, Leanna Sato    . Depressive disorder, not elsewhere classified   . Diarrhea 09/27/2008   Qualifier: Diagnosis of  By: Trellis Paganini PA-c, Amy S   . Diverticulosis of colon (without mention of hemorrhage)   . Elevated liver enzymes 10/31/2015  . Esophageal reflux   . ETOH abuse 08/14/2012  . Gastritis 04/28/2011  . GERD (gastroesophageal reflux disease)   . Hematochezia 08/14/2012  . Hemoptysis 05/26/2017  . HEMORRHOIDS 09/12/2007   Qualifier: Diagnosis of  By: Nils Pyle CMA ([place]), Mearl Latin    . Hepatic steatosis   . HIATAL HERNIA 09/12/2007   Qualifier: Diagnosis of  By: Nils Pyle CMA ([place]), Mearl Latin    . History of recurrent UTIs   . Hyperlipidemia   . Hypertension   . Hypertensive retinopathy of both eyes 11/24/2015  . INFECTIOUS DIARRHEA 09/27/2008   Qualifier: Diagnosis of  By: Laney Potash, Pam    . Iron deficiency   . Iron overload 09/22/2017  . Nausea alone 09/27/2008   Qualifier: Diagnosis of  By: Laney Potash, [place] 09/27/2008   Qualifier: Diagnosis of  By: Shane Crutch, Amy S   . Obesity, Class III, BMI 40-49.9 (morbid  obesity) ([place]) 04/27/2011  . Personal history of colonic polyps 10/16/2009   hyperplastic  . Polyarthralgia 12/02/2015   Evaluated by Dr. Charlestine Night (rheumatologist) 12/29/2015:1): 1)possible fibromyalgia (Suggest physical therapy and flexeril), 2) Polyarthralgia/Insomnia (suggest use of robaxin or flexeril), 3) Nicotine Dependence (counseled to quit). 4)Depression (continue therapy with psychiatrist)  . Pre-diabetes   . Pseudocyst of pancreas 08/12/2012  . Pulmonary nodule, right 11/29/2016  . Rectal fissure  05/11/2011  . Rectal fistula 05/11/2011  . RECTAL PAIN 09/12/2007   Qualifier: Diagnosis of  By: Nils Pyle CMA ([place]), Mearl Latin    . Secondary hemochromatosis 09/22/2017  . Seizures ([place])   . SIRS (systemic inflammatory response syndrome) ([place]) 03/11/2013  . Solitary rectal ulcer 04/12/2011  . TOBACCO ABUSE 12/06/2006   Qualifier: History of  By: Elsworth Soho MD, Leanna Sato.    . Ulcerative proctitis, nonspecific ([place]) 03/10/2011  . Varicose veins of both lower extremities 12/02/2015     Significant Hospital Events: Including procedures, antibiotic start and stop dates in addition to other pertinent events   . Admitted for persistent hypoxemia requiring BiPAP application . Decompensated overnight and intubated early 05/30/2020 . Central venous access placement 05/30/2020 . 4/9 new fever, added vanc back to cefepime, CXR looks wet as does CT lung portion of CT AP . 4/10 Cr, UOP better  Interim History / Subjective:  Fever persists, NE weaning with vaso added, UOP Cr improving  Objective   Blood pressure 130/60, pulse 66, temperature 98.4 F (36.9 C), temperature source Axillary, resp. rate (!) 23, height _0  (1.651 m), weight 103 kg, SpO2 93 %. CVP:  [14 mmHg-23 mmHg] 22 mmHg  Vent Mode: PRVC FiO2 (%):  [70 %-80 %] 70 % Set Rate:  [24 bmp] 24 bmp Vt Set:  [500 mL] 500 mL PEEP:  [10 cmH20-16 cmH20] 16 cmH20 Plateau Pressure:  [23 cmH20-35 cmH20] 30 cmH20   Intake/Output Summary (Last 24 hours) at 06/01/2020 0955 Last data filed at 06/01/2020 0600 Gross per 24 hour  Intake 3103.74 ml  Output 725 ml  Net 2378.74 ml   Filed Weights   06/01/2020 1654 05/31/20 0500 06/01/20 0500  Weight: 98.5 kg 101.8 kg 103 kg    Examination: General: Middle-age lady, does not appear to be in distress, sedated HENT: Moist oral mucosa, endotracheal tube in place Lungs: Bilateral rhonchi Cardiovascular: S1-S2 appreciated, warm Abdomen: Soft, bowel sounds appreciated Extremities: No clubbing, no edema Neuro:  Sedated GU:   Labs/imaging that I havepersonally reviewed    Chest x-ray reviewed showing worsening of infiltrates.  Lines in good placement MRSA PCR negative Culltures reviewed  Resolved Hospital Problem list     Assessment & Plan:  Acute on chronic hypoxemic respiratory failure Continue mechanical ventilation Target TVol 6-8cc/kgIBW Target Plateau Pressure < 30cm H20 Target driving pressure less than 15 cm of water Target PaO2 55-65: titrate PEEP/FiO2 per protocol As long as PaO2 to FiO2 ratio is less than 1:150 position in prone position for 16 hours a day Check CVP daily if CVL in place Ventilator associated pneumonia prevention protocol  Septic Shock: febrile, tachycardic, renal dysfunction, on vasopressors. Lung source? But new fever and think lungs represent edema --UA, blood cultures ordered --No ascites on bedside US amenable to diagnostic tap --MAP > 75 (HRS), NE, vaso, weaning  AKI: Possible HRS vs ATN from hypotension/shock or both vs elevated venous pressures. Cr and UOP improved with higher MAP target for HRS (MAP 75) --CVP flat 15 --FiO2 weaning, hold diuresis today concern septic shock --Urine Na --  Poor dialysis candidate  Chest infiltrates: Most consistent with pulmonary edema, more febrile, vanc added back 4/9.  -cefepime, vanc - f/u respiratory cultures (no organisms) --Consider BAL  History of COPD -Continue bronchodilators  Nutrition: enteral nutrition  Agitation -Continue fentanyl gtt, drip, ativan PRN --Wean off precedex (hypotension), propofol caused hypotension  Hypertension -Hold antihypertensives at present  Hyperglycemia: shock, TFs --SSI  Best practice (right click and "Reselect all SmartList Selections" daily)  Diet:  Tube Feed  Pain/Anxiety/Delirium protocol (if indicated): Yes (RASS goal -1) VAP protocol (if indicated): Yes DVT prophylaxis: LMWH GI prophylaxis: PPI Glucose control:  SSI Yes Central venous access:  Yes, and  it is still needed Arterial line:  N/A Foley:  Yes, and it is still needed Mobility:  bed rest  PT consulted: N/A Last date of multidisciplinary goals of care discussion 4/9 with sister - DNR, no escalation of care, continue current therapies if improving, otherwise transition to CC if no improvement or worsening.  Code Status:  DNR Disposition: icu  Labs   Reviewed and as per EMR  CRITICAL CARE Performed by: Bonna Gains Ellyce Lafevers   Total critical care time: 38  minutes  Critical care time was exclusive of separately billable procedures and treating other patients.  Critical care was necessary to treat or prevent imminent or life-threatening deterioration.  Critical care was time spent personally by me on the following activities: development of treatment plan with patient and/or surrogate as well as nursing, discussions with consultants, evaluation of patient's response to treatment, examination of patient, obtaining history from patient or surrogate, ordering and performing treatments and interventions, ordering and review of laboratory studies, ordering and review of radiographic studies, pulse oximetry and re-evaluation of patient's condition.

## 2020-06-01 NOTE — Progress Notes (Signed)
eLink Physician-Brief Progress Note Patient Name: [name] [name] DOB: March 07, 1960 MRN: [id number]   Date of Service  06/01/2020  HPI/Events of Note  Asking for flexiseal.  On Lactulose q12 hrly.  Potassium 3.8   eICU Interventions  Ordered flexi seal.     Intervention Category Minor Interventions: Other:;Routine modifications to care plan (e.g. PRN medications for pain, fever)  [name] 06/01/2020, 9:25 PM

## 2020-06-01 NOTE — Progress Notes (Signed)
eLink Physician-Brief Progress Note Patient Name: [name] [name] DOB: [birth year] MRN: [id number]   Date of Service  06/01/2020  HPI/Events of Note  Bedside RN did a random (not ordered) check for TF residual and found 775CC,  RN wants to know if she should give it back or discard it, was covered with 2 u of Insuliln for CBG 189    pt does now have the flexi seal you just ordered and is receiving lacutlose twice a day.  TF was just restarted today, rate at 50 cc/hr  eICU Interventions  - re feed 500 ml, hold TF for 2 hrs. Recheck residual and start Tf at 30 cc/hr.      Intervention Category Intermediate Interventions: Other:  [name] 06/01/2020, 10:00 PM

## 2020-06-01 NOTE — Progress Notes (Signed)
Received in report patient having issues with acid reflux. Charge nurse noted oral secretions appearing "tube feed" in nature. Tube feeds started this afternoon and OG placement verified. Prior to giving medications, residual checked with 775 ml out. E link notified. See orders for details. Will continue to monitor patient.

## 2020-06-02 ENCOUNTER — Inpatient Hospital Stay (HOSPITAL_COMMUNITY): Payer: PPO

## 2020-06-02 DIAGNOSIS — R6521 Severe sepsis with septic shock: Secondary | ICD-10-CM

## 2020-06-02 DIAGNOSIS — A419 Sepsis, unspecified organism: Secondary | ICD-10-CM

## 2020-06-02 DIAGNOSIS — J9601 Acute respiratory failure with hypoxia: Secondary | ICD-10-CM | POA: Diagnosis not present

## 2020-06-02 LAB — CBC
HCT: 27.9 % — ABNORMAL LOW (ref 36.0–46.0)
Hemoglobin: 8.9 g/dL — ABNORMAL LOW (ref 12.0–15.0)
MCH: 35 pg — ABNORMAL HIGH (ref 26.0–34.0)
MCHC: 31.9 g/dL (ref 30.0–36.0)
MCV: 109.8 fL — ABNORMAL HIGH (ref 80.0–100.0)
Platelets: 342 10*3/uL (ref 150–400)
RBC: 2.54 MIL/uL — ABNORMAL LOW (ref 3.87–5.11)
RDW: 15.6 % — ABNORMAL HIGH (ref 11.5–15.5)
WBC: 11.8 10*3/uL — ABNORMAL HIGH (ref 4.0–10.5)
nRBC: 0 % (ref 0.0–0.2)

## 2020-06-02 LAB — GLUCOSE, CAPILLARY
Glucose-Capillary: 159 mg/dL — ABNORMAL HIGH (ref 70–99)
Glucose-Capillary: 161 mg/dL — ABNORMAL HIGH (ref 70–99)
Glucose-Capillary: 123 mg/dL — ABNORMAL HIGH (ref 70–99)
Glucose-Capillary: 149 mg/dL — ABNORMAL HIGH (ref 70–99)
Glucose-Capillary: 150 mg/dL — ABNORMAL HIGH (ref 70–99)
Glucose-Capillary: 156 mg/dL — ABNORMAL HIGH (ref 70–99)

## 2020-06-02 LAB — CULTURE, RESPIRATORY W GRAM STAIN
Culture: NO GROWTH
Culture: NORMAL

## 2020-06-02 LAB — BASIC METABOLIC PANEL
Anion gap: 8 (ref 5–15)
BUN: 37 mg/dL — ABNORMAL HIGH (ref 6–20)
CO2: 21 mmol/L — ABNORMAL LOW (ref 22–32)
Calcium: 8.1 mg/dL — ABNORMAL LOW (ref 8.9–10.3)
Chloride: 109 mmol/L (ref 98–111)
Creatinine, Ser: 1.1 mg/dL — ABNORMAL HIGH (ref 0.44–1.00)
GFR, Estimated: 58 mL/min — ABNORMAL LOW (ref >60)
Glucose, Bld: 167 mg/dL — ABNORMAL HIGH (ref 70–99)
Potassium: 3.8 mmol/L (ref 3.5–5.1)
Sodium: 138 mmol/L (ref 135–145)

## 2020-06-02 MED ORDER — PROPOFOL 1000 MG/100ML IV EMUL
5.0000 ug/kg/min | INTRAVENOUS | Status: DC
  Administered 2020-06-02: 5 ug/kg/min via INTRAVENOUS
  Administered 2020-06-03: 80 ug/kg/min via INTRAVENOUS
  Administered 2020-06-03: 45 ug/kg/min via INTRAVENOUS
  Administered 2020-06-03: 80 ug/kg/min via INTRAVENOUS
  Administered 2020-06-03: 60 ug/kg/min via INTRAVENOUS
  Filled 2020-06-02 (×5): qty 100

## 2020-06-02 MED ORDER — DEXMEDETOMIDINE HCL IN NACL 200 MCG/50ML IV SOLN
0.4000 ug/kg/h | INTRAVENOUS | Status: DC
  Administered 2020-06-02: 0.7 ug/kg/h via INTRAVENOUS
  Filled 2020-06-02: qty 50

## 2020-06-02 MED ORDER — FUROSEMIDE 10 MG/ML IJ SOLN
40.0000 mg | Freq: Once | INTRAMUSCULAR | Status: AC
  Administered 2020-06-02: 40 mg via INTRAVENOUS
  Filled 2020-06-02: qty 4

## 2020-06-02 MED ORDER — SODIUM CHLORIDE 0.9 % IV SOLN
2.0000 g | Freq: Three times a day (TID) | INTRAVENOUS | Status: DC
  Administered 2020-06-02 – 2020-06-03 (×2): 2 g via INTRAVENOUS
  Filled 2020-06-02 (×2): qty 2

## 2020-06-02 MED ORDER — DEXTROSE 10 % IV SOLN
INTRAVENOUS | Status: AC
  Filled 2020-06-02: qty 1000

## 2020-06-02 MED ORDER — METOCLOPRAMIDE HCL 5 MG/ML IJ SOLN
5.0000 mg | Freq: Once | INTRAMUSCULAR | Status: AC
  Administered 2020-06-02: 5 mg via INTRAVENOUS
  Filled 2020-06-02: qty 2

## 2020-06-02 MED ORDER — FUROSEMIDE 10 MG/ML IJ SOLN
20.0000 mg | Freq: Once | INTRAMUSCULAR | Status: AC
  Administered 2020-06-02: 20 mg via INTRAVENOUS
  Filled 2020-06-02: qty 2

## 2020-06-02 NOTE — Plan of Care (Signed)

## 2020-06-02 NOTE — TOC Progression Note (Signed)
Transition of Care [facility]) - Progression Note    Patient Details  Name: [name] [name] MRN: [id number] Date of Birth: Sep 22, 1960  [facility] CM/SW Contact  Leeroy Cha, RN Phone Number: 06/02/2020, 9:16 AM  Clinical Narrative:    Significant Hospital Events: Including procedures, antibiotic start and stop dates in addition to other pertinent events    Admitted for persistent hypoxemia requiring BiPAP application  Decompensated overnight and intubated early 05/30/2020  Central venous access placement 05/30/2020  4/9 new fever, added vanc back to cefepime, CXR looks wet as does CT lung portion of CT AP  4/10 Cr, UOP better  PLANS: remains on vent, iv abx, iv sedation and pressors, will need substance abuse information later ac dc.   Expected Discharge Plan: [place] Barriers to Discharge: Continued Medical Work up  Expected Discharge Plan and Services Expected Discharge Plan: [place]   Discharge Planning Services: CM Consult Post Acute Care Choice: [place] arrangements for the past 2 months: Single Family Home Expected Discharge Date:  (unknown)                           [place] Agency: [place]         Social Determinants of Health (SDOH) Interventions    Readmission Risk Interventions No flowsheet data found.

## 2020-06-02 NOTE — Progress Notes (Signed)
eLink Physician-Brief Progress Note Patient Name: [name] [name] DOB: 05-05-1960 MRN: [id number]   Date of Service  06/02/2020  HPI/Events of Note  RN rechecked residual as you asked and now there is 800cc residual, RN needs to know what to do. On flexiseal, making bowels.    eICU Interventions  Re feed back 300 ml. Hold TF. Prn checks.  Reglan one dose.      Intervention Category Intermediate Interventions: Other:  [name] 06/02/2020, 1:58 AM

## 2020-06-02 NOTE — Progress Notes (Signed)
Pharmacy Antibiotic Note  [name] [name] is a 60 y.o. female admitted on 05/25/2020 with pneumonia.  Pharmacy has been consulted for vancomycin and cefepime dosing  Today, 06/02/2020:  D5 abx, D2 resume vanc  Temp running borderline high; last true fever 4/9  AKI resolving (baseline SCr 0.7)  WBC elevated but continues to decrease  Plan:  Increase cefepime back to 2g IV q8 hr with improved renal function  Continue vanc 1000 mg IV q24 hr, AUC lower than previously calculated but still >400  Height: 5\' 5"  (165.1 cm) Weight: 107.1 kg (236 lb 1.8 oz) IBW/kg (Calculated) : 57  Temp (24hrs), Avg:98.8 F (37.1 C), Min:97.7 F (36.5 C), Max:100.2 F (37.9 C)  Recent Labs  Lab 06/02/2020 1233 05/28/2020 1249 05/30/20 0243 05/31/20 0230 06/01/20 0500 06/02/20 0559  WBC 16.5*  --  14.5* 17.6* 12.7* 11.8*  CREATININE 0.77 0.70 0.88 1.42* 1.27* 1.10*    Estimated Creatinine Clearance: 66.9 mL/min (A) (by C-G formula based on SCr of 1.1 mg/dL (H)).    No Known Allergies   Antimicrobials this admission:  4/7 Cefepime >> 4/7 Vanc >> 4/8; resume 4/9 >>  4/7 Azith/Doxy x 1 dose Dose adjustments this admission:  4/9 Cefepime decr q12 4/11 Cefepime incr to q8 hr Microbiology results:  4/7 MRSA neg 4/7 flu/covid neg 4/8 trach aspirate: NGF 4/9 BCx: ngtd 4/11 UCx: sent  Thank you for allowing pharmacy to be a part of this patient's care.   Reuel Boom, PharmD, BCPS [phone] 06/02/2020, 3:36 PM

## 2020-06-02 NOTE — Progress Notes (Signed)
eLink Physician-Brief Progress Note Patient Name: [name] [name] DOB: [birth year] MRN: [id number]   Date of Service  06/02/2020  HPI/Events of Note  Patient needs order to discontinue tube feeds, CVP 18-21 with sats 91 %,  Patient needs updated sedation orders.  eICU Interventions  Tube feeds held, Lasix 20 mg iv x 1 ordered, Propofol added to sedation regimen and Fentanyl ceiling increased to 400 mcg, D 10 infusion started at 20 ml  / hour while tube feeds are held.        Kerry Kass Rhylynn Perdomo 06/02/2020, 9:30 PM

## 2020-06-02 NOTE — Progress Notes (Addendum)
NAME:  [name] [name], MRN:  [id number], DOB:  [birth year], LOS: 4 ADMISSION DATE:  06/10/2020, CONSULTATION DATE:  05/30/2020 REFERRING MD:  Dr Florene Glen, CHIEF COMPLAINT:  persistent hypoxia  History of Present Illness:  [name] [name] is a 60 y.o. female with PMX significant for but not limited to seizures, right pulmonary nodule, HTN, HLD, prior ETOH abuse with resultant cirrhosis, CKD, anxiety, and depression who presented with continued complaints of progressive hypoxia. Of note patient was recently treated at this facility 3/23 - 4/1 for multifocal PNA with ARF secondary to hepatorenal syndrome. She was discharged on Augmentin. She states she did not feel completely back to baseline by discharge but she feels significantly worse now. In addition to SOB she also reports some chills, fatigue, persistent productive cough with thick yellow/green sputum, nausea, and questionable thrush.SOB is worsened by exertion and lying flat. States she missed one dose of Augmentin due to nausea. She denies any smoking or vaping since March of 2021.   On EMS arrival she was seen hypoxic at 62% on RA. On ED arrival oxygen sats 93% on 15L [place] with all other vitals WNL. She later became more hypoxic on NRB resulting in application of BIPAP in ED. Pertinent labs include; alkaline phos 146, total albumin 4.2, BNP 366, WBC 16.5, Hgb 9.9. Given high supplemental oxygen need and pulmonary history PCCM was consulted for further management.   Pertinent  Medical History   Past Medical History:  Diagnosis Date  . ABDOMINAL PAIN -GENERALIZED 09/27/2008   Qualifier: Diagnosis of  By: Trellis Paganini PA-c, Amy S   . Acute epigastric pain 04/27/2011  . Acute pancreatitis 08/12/2012  . Allergic rhinitis 09/05/2017  . Anal fistula   . Anemia   . Anxiety   . Arthritis   . BIPOLAR AFFECTIVE DISORDER 12/06/2006   Qualifier: History of  By: Elsworth Soho MD, Leanna Sato Bipolar disorder ([place])   . Chronic kidney disease   . COPD (chronic  obstructive pulmonary disease) ([place]) 11/08/2017  . Cough 12/06/2006   Annotation: chronic since'96, nml spirometry, +ve methacholine challenge, nml  CT sinuses, inconclusive pH probe, esophageal manometry Centricity Description: SYMPTOM, COUGH Qualifier: History of  By: Elsworth Soho MD, Leanna Sato   Centricity Description: COUGH Qualifier: Diagnosis of  By: Elsworth Soho MD, Leanna Sato    . Depressive disorder, not elsewhere classified   . Diarrhea 09/27/2008   Qualifier: Diagnosis of  By: Trellis Paganini PA-c, Amy S   . Diverticulosis of colon (without mention of hemorrhage)   . Elevated liver enzymes 10/31/2015  . Esophageal reflux   . ETOH abuse 08/14/2012  . Gastritis 04/28/2011  . GERD (gastroesophageal reflux disease)   . Hematochezia 08/14/2012  . Hemoptysis 05/26/2017  . HEMORRHOIDS 09/12/2007   Qualifier: Diagnosis of  By: Nils Pyle CMA ([place]), Mearl Latin    . Hepatic steatosis   . HIATAL HERNIA 09/12/2007   Qualifier: Diagnosis of  By: Nils Pyle CMA ([place]), Mearl Latin    . History of recurrent UTIs   . Hyperlipidemia   . Hypertension   . Hypertensive retinopathy of both eyes 11/24/2015  . INFECTIOUS DIARRHEA 09/27/2008   Qualifier: Diagnosis of  By: Laney Potash, Pam    . Iron deficiency   . Iron overload 09/22/2017  . Nausea alone 09/27/2008   Qualifier: Diagnosis of  By: Laney Potash, [place] 09/27/2008   Qualifier: Diagnosis of  By: Shane Crutch, Amy S   . Obesity, Class III, BMI 40-49.9 (morbid  obesity) ([place]) 04/27/2011  . Personal history of colonic polyps 10/16/2009   hyperplastic  . Polyarthralgia 12/02/2015   Evaluated by Dr. Charlestine Night (rheumatologist) 12/29/2015:1): 1)possible fibromyalgia (Suggest physical therapy and flexeril), 2) Polyarthralgia/Insomnia (suggest use of robaxin or flexeril), 3) Nicotine Dependence (counseled to quit). 4)Depression (continue therapy with psychiatrist)  . Pre-diabetes   . Pseudocyst of pancreas 08/12/2012  . Pulmonary nodule, right 11/29/2016  . Rectal fissure  05/11/2011  . Rectal fistula 05/11/2011  . RECTAL PAIN 09/12/2007   Qualifier: Diagnosis of  By: Nils Pyle CMA ([place]), Mearl Latin    . Secondary hemochromatosis 09/22/2017  . Seizures ([place])   . SIRS (systemic inflammatory response syndrome) ([place]) 03/11/2013  . Solitary rectal ulcer 04/12/2011  . TOBACCO ABUSE 12/06/2006   Qualifier: History of  By: Elsworth Soho MD, Leanna Sato.    . Ulcerative proctitis, nonspecific ([place]) 03/10/2011  . Varicose veins of both lower extremities 12/02/2015     Significant Hospital Events: Including procedures, antibiotic start and stop dates in addition to other pertinent events   . Admitted for persistent hypoxemia requiring BiPAP application . Decompensated overnight and intubated early 05/30/2020 . Central venous access placement 05/30/2020 . 4/9 new fever, added vanc back to cefepime, CXR looks wet as does CT lung portion of CT AP . 4/10 Cr, UOP better . 4/11 Creatinine continues to improve mains on basal and levo   Micro 4/7 MRSA>> negative 4/8 sputum CX>> no growth final 4/9 blood cultures x2>>   Antibiotics Azithromycin 4/7 Doxycycline 4/7 Cefepime 4/7- Vancomycin 4/7-  Tubes/lines ETT 4/8- Right IJ CVC 4/8-  Interim History / Subjective:  Continued sedation requirement secondary to agitation.  Tube feed residuals overnight  Objective   Blood pressure (!) 133/52, pulse 72, temperature 100.2 F (37.9 C), temperature source Axillary, resp. rate 19, height _0  (1.651 m), weight 107.1 kg, SpO2 96 %. CVP:  [20 mmHg-27 mmHg] 26 mmHg  Vent Mode: PRVC FiO2 (%):  [80 %-100 %] 80 % Set Rate:  [24 bmp] 24 bmp Vt Set:  [500 mL] 500 mL PEEP:  [16 cmH20] 16 cmH20 Plateau Pressure:  [27 cmH20-37 cmH20] 33 cmH20   Intake/Output Summary (Last 24 hours) at 06/02/2020 0843 Last data filed at 06/02/2020 3212 Gross per 24 hour  Intake 3545.3 ml  Output 3025 ml  Net 520.3 ml   Filed Weights   05/31/20 0500 06/01/20 0500 06/02/20 0500  Weight: 101.8 kg 103 kg 107.1 kg     General: Well-nourished female, intubated and sedated, no severe distress HEENT: MM pink/moist, ET tube place, right IJ CVC in place Neuro: Examined after fentanyl bolus and on fentanyl and Precedex, not responsive, positive gag, pupils equal and reactive, does not withdraw to pain, few triggered breaths on vent CV: s1s2 RRR, no m/r/g PULM: Rhonchi bilaterally, remains on PRVC with high FiO2 and PEEP requirements, 90%/16 GI: soft, bsx4 active  Extremities: warm/dry, 1+ edema  Skin: no rashes or lesions   Labs/imaging that I havepersonally reviewed      Resolved Hospital Problem list     Assessment & Plan:     Acute on chronic hypoxemic respiratory failure Likely secondary to pneumonia with some element of pulmonary edema and ARDS Continues to require high FiO2 and PEEP Mental status precludes SBT/SAT today TV 500, 6cc/kg ~350, if picture more consistent with ARDS rather than edema may need to decrease volumes P: -Wean FiO2 to 70% -Baseline CXR then Lasix 40 mg, CVP 26, Target TVol 6-8cc/kgIBW -Continue lung protective ventilation, pressures at  goal -Maintain full vent support with SAT/SBT as tolerated -titrate Vent setting to maintain SpO2 greater than or equal to 90%. -HOB elevated 30 degrees. -Plateau pressures less than 30 cm H20.  -Follow chest x-ray, ABG prn.   -Bronchial hygiene and RT/bronchodilator protocol. -ABG in a.m. As long as PaO2 to FiO2 ratio is less than 1:150 position in prone position for 16 hours a day    Septic Shock: Possibly secondary to pneumonia, though respiratory and blood cultures have been negative Bedside ultrasound with no ascites on admission Had Leuks and WBC's on admission, no UC sent P: -Follow blood cultures to completion -Send urine culture, should be covered with current antibiotic regimen unless patient were to have an ESBL -Weaning Levophed and vasopressin, targeting MAP> 5 for hepatorenal syndrome   AKI: Possible HRS vs  ATN from hypotension/shock or both vs elevated venous pressures. Cr and UOP improved with higher MAP target for HRS (MAP 75) P: -Creatinine continues to improve, good urine output -Pressor requirement improving so we will proceed with 40 mg Lasix x1 and monitor response -Poor dialysis candidate if worsens will need to revisit goals of care   Chest infiltrates:  Most consistent with pulmonary edema, more febrile, vanc added back 4/9.  P: -cefepime, vanc - f/u respiratory cultures (no organisms) --Consider BAL  Hyperbilirubinemia, Cirrhosis AST 146 and bilirubin 4.8 on admission CT scan 4/3 with gallbladder distention but no wall thickening or stones P: -She is clinically improving from a sepsis standpoint, if were to worsen consider acalculous cholecystitis and right upper quadrant ultrasound  History of COPD -Continue bronchodilators  Nutrition:  -Tube feed residuals overnight, feeds held this morning -KUB without evidence of obstruction, positive for gaseous distention -Suspect is an absorption issue, speak with nutrition to see if there is a more concentrated tube feed option  Agitation -Continue fentanyl gtt, drip, ativan PRN --Wean off precedex (hypotension), propofol caused hypotension -ammonia negative on admission  Hypertension -Hold antihypertensives at present  Hyperglycemia: shock, TFs --SSI  Best practice (right click and "Reselect all SmartList Selections" daily)  Diet:  Tube Feed Held 4/11 for increased residuals Pain/Anxiety/Delirium protocol (if indicated): Yes (RASS goal -1) VAP protocol (if indicated): Yes DVT prophylaxis: LMWH GI prophylaxis: PPI Glucose control:  SSI Yes Central venous access:  Yes, and it is still needed Arterial line:  N/A Foley:  Yes, and it is still needed Mobility:  bed rest  PT consulted: N/A Last date of multidisciplinary goals of care discussion Dr. Silas Flood had conversation 4/9 with sister - DNR, no escalation of care,  continue current therapies if improving, otherwise transition to CC if no improvement or worsening.  Code Status:  DNR Disposition: icu  Labs   Reviewed and as per EMR  CRITICAL CARE Performed by: Otilio Carpen Anthon Harpole   Total critical care time: 35  minutes  Critical care time was exclusive of separately billable procedures and treating other patients.  Critical care was necessary to treat or prevent imminent or life-threatening deterioration.  Critical care was time spent personally by me on the following activities: development of treatment plan with patient and/or surrogate as well as nursing, discussions with consultants, evaluation of patient's response to treatment, examination of patient, obtaining history from patient or surrogate, ordering and performing treatments and interventions, ordering and review of laboratory studies, ordering and review of radiographic studies, pulse oximetry and re-evaluation of patient's condition.   Otilio Carpen Ranveer Wahlstrom, PA-C [facility] See Amion for pager If no response  to pager , please call [phone] until 7pm After 7:00 pm call Elink  [phone]

## 2020-06-03 DIAGNOSIS — J9601 Acute respiratory failure with hypoxia: Secondary | ICD-10-CM | POA: Diagnosis not present

## 2020-06-03 LAB — COMPREHENSIVE METABOLIC PANEL
ALT: 23 U/L (ref 0–44)
AST: 83 U/L — ABNORMAL HIGH (ref 15–41)
Albumin: 2 g/dL — ABNORMAL LOW (ref 3.5–5.0)
Alkaline Phosphatase: 122 U/L (ref 38–126)
Anion gap: 8 (ref 5–15)
BUN: 42 mg/dL — ABNORMAL HIGH (ref 6–20)
CO2: 21 mmol/L — ABNORMAL LOW (ref 22–32)
Calcium: 8 mg/dL — ABNORMAL LOW (ref 8.9–10.3)
Chloride: 106 mmol/L (ref 98–111)
Creatinine, Ser: 1.23 mg/dL — ABNORMAL HIGH (ref 0.44–1.00)
GFR, Estimated: 51 mL/min — ABNORMAL LOW (ref >60)
Glucose, Bld: 192 mg/dL — ABNORMAL HIGH (ref 70–99)
Potassium: 3.9 mmol/L (ref 3.5–5.1)
Sodium: 135 mmol/L (ref 135–145)
Total Bilirubin: 3.3 mg/dL — ABNORMAL HIGH (ref 0.3–1.2)
Total Protein: 6.7 g/dL (ref 6.5–8.1)

## 2020-06-03 LAB — BLOOD GAS, ARTERIAL
Acid-base deficit: 8 mmol/L — ABNORMAL HIGH (ref 0.0–2.0)
Allens test (pass/fail): POSITIVE — AB
Bicarbonate: 20.6 mmol/L (ref 20.0–28.0)
FIO2: 90
MECHVT: 500 mL
O2 Saturation: 92.3 %
PEEP: 16 cmH2O
Patient temperature: 98.6
RATE: 24 resp/min
pCO2 arterial: 55.8 mmHg — ABNORMAL HIGH (ref 32.0–48.0)
pH, Arterial: 7.193 — CL (ref 7.350–7.450)
pO2, Arterial: 78.9 mmHg — ABNORMAL LOW (ref 83.0–108.0)

## 2020-06-03 LAB — GLUCOSE, CAPILLARY
Glucose-Capillary: 186 mg/dL — ABNORMAL HIGH (ref 70–99)
Glucose-Capillary: 166 mg/dL — ABNORMAL HIGH (ref 70–99)

## 2020-06-03 LAB — CBC
HCT: 27.5 % — ABNORMAL LOW (ref 36.0–46.0)
Hemoglobin: 8.7 g/dL — ABNORMAL LOW (ref 12.0–15.0)
MCH: 35.7 pg — ABNORMAL HIGH (ref 26.0–34.0)
MCHC: 31.6 g/dL (ref 30.0–36.0)
MCV: 112.7 fL — ABNORMAL HIGH (ref 80.0–100.0)
Platelets: 367 10*3/uL (ref 150–400)
RBC: 2.44 MIL/uL — ABNORMAL LOW (ref 3.87–5.11)
RDW: 15.8 % — ABNORMAL HIGH (ref 11.5–15.5)
WBC: 17.1 10*3/uL — ABNORMAL HIGH (ref 4.0–10.5)
nRBC: 0 % (ref 0.0–0.2)

## 2020-06-03 LAB — URINE CULTURE: Culture: <10000 — AB

## 2020-06-03 LAB — TRIGLYCERIDES: Triglycerides: 186 mg/dL — ABNORMAL HIGH (ref <150)

## 2020-06-03 MED ORDER — SODIUM CHLORIDE 0.9 % IV SOLN
2.0000 mg/h | INTRAVENOUS | Status: DC
  Administered 2020-06-03 (×2): 2 mg/h via INTRAVENOUS
  Filled 2020-06-03: qty 2.5

## 2020-06-03 MED ORDER — MIDAZOLAM HCL 2 MG/2ML IJ SOLN
2.0000 mg | INTRAMUSCULAR | Status: DC | PRN
  Administered 2020-06-03 (×2): 2 mg via INTRAVENOUS
  Filled 2020-06-03 (×2): qty 2

## 2020-06-03 MED ORDER — POLYVINYL ALCOHOL 1.4 % OP SOLN: 1.0000 [drp] | Freq: Four times a day (QID) | OPHTHALMIC | Status: DC | PRN

## 2020-06-03 MED ORDER — LORAZEPAM 2 MG/ML IJ SOLN: 2.0000 mg | INTRAMUSCULAR | Status: DC | PRN

## 2020-06-03 MED ORDER — HYDROMORPHONE BOLUS VIA INFUSION
2.0000 mg | INTRAVENOUS | Status: DC | PRN
Start: 2020-06-03 — End: 2020-06-03
  Administered 2020-06-03 (×3): 2 mg via INTRAVENOUS
  Filled 2020-06-03: qty 2

## 2020-06-03 MED ORDER — BUMETANIDE 0.25 MG/ML IJ SOLN
1.0000 mg | Freq: Once | INTRAMUSCULAR | Status: AC
  Administered 2020-06-03: 1 mg via INTRAVENOUS
  Filled 2020-06-03: qty 10

## 2020-06-04 DIAGNOSIS — J80 Acute respiratory distress syndrome: Secondary | ICD-10-CM

## 2020-06-04 DIAGNOSIS — Z66 Do not resuscitate: Secondary | ICD-10-CM

## 2020-06-04 DIAGNOSIS — Z515 Encounter for palliative care: Secondary | ICD-10-CM

## 2020-06-05 LAB — CULTURE, BLOOD (ROUTINE X 2)
Culture: NO GROWTH
Culture: NO GROWTH

## 2020-06-22 NOTE — Procedures (Signed)
Extubation Procedure Note  Patient Details:   Name: [name] [name] DOB: 04/04/1960 MRN: [id number]   Airway Documentation:    Vent end date: 06-18-2020 Vent end time: 1045   Evaluation  O2 sats: N/A Complications: None Patient tolerated procedure well. Bilateral Breath Sounds: N/A   Terminal Extubation  [name] [name] 18-Jun-2020, 10:51 AM

## 2020-06-22 NOTE — Progress Notes (Signed)
eLink Physician-Brief Progress Note Patient Name: LAVETTE YANKOVICH DOB: [birth year] MRN: [id number]   Date of Service  06/09/20  HPI/Events of Note  Sub-optimal sedation on Fentanyl + Propofol, low urine output.  eICU Interventions  Versed 2 mg iv Q 2 hours PRN, Bumex 1 mg iv x 1.        Kerry Kass Enisa Runyan 06/09/2020, 12:38 AM

## 2020-06-22 NOTE — Progress Notes (Signed)
Patient set respiratory rate on ventilator adjusted to 30 per order.

## 2020-06-22 NOTE — Death Summary Note (Signed)
DEATH SUMMARY   Patient Details  Name: [name] [name] MRN: [id number] DOB: [birth year]  Admission/Discharge Information   Admit Date:  06-13-2020  Date of Death: Date of Death: 06/18/2020  Time of Death: Time of Death: May 27, 1111  Length of Stay: 5  Referring Physician: [name], FNP   Reason(s) for Hospitalization  Respiratory failure, cough  Diagnoses  Preliminary cause of death: Acute respiratory distress syndrome with refractory septic shock Secondary Diagnoses (including complications and co-morbidities):  Principal Problem:   Acute respiratory failure with hypoxia ([place]) Active Problems:   Acute epigastric pain   ETOH abuse   SIRS (systemic inflammatory response syndrome) ([place])   Hypertension   Hyperlipidemia   Alcohol use disorder, severe, dependence ([place])   Bilirubinemia   Cirrhosis ([place])   Septic shock ([place])   DNR (do not resuscitate)   Comfort measures only status   ARDS (adult respiratory distress syndrome) [facility])   Brief Hospital Course (including significant findings, care, treatment, and services provided and events leading to death)  [name] [name] is a 60 y.o. year old female who has EtOH cirrhosis recent admission with renal failure due to HRS with persistent pulmonary infiltrates presented with shortness of breath, needing O2. Declined and was intubated. Developed renal failure. Developed fever and septic shock. Source unclear, possible lung but infiltrates preceded fever and shock. All cultures with no growth. She had aspiration events that led to worsening respiratory failure and shock. Sister said she would not want prolonged life support. She was made comfort care. She was terminally extubated and passed comfortably.    Pertinent Labs and Studies  Significant Diagnostic Studies CT ABDOMEN PELVIS WO CONTRAST  Result Date: 05/15/2020 CLINICAL DATA:  Acute renal insufficiency, cirrhosis EXAM: CT ABDOMEN AND PELVIS WITHOUT CONTRAST TECHNIQUE:  Multidetector CT imaging of the abdomen and pelvis was performed following the standard protocol without IV contrast. COMPARISON:  05/15/2020, 03/11/2013 FINDINGS: Lower chest: Patchy bibasilar airspace disease consistent with multifocal pneumonia. No effusion. Hepatobiliary: Diffuse hepatic steatosis again noted, with heterogeneous nodularity consistent with known cirrhosis. Please refer to MRI performed earlier today for detailed description of the liver parenchyma. The gallbladder is distended, with no evidence of cholelithiasis or cholecystitis. Pancreas: Diffuse pancreatic parenchymal atrophy. No focal abnormality. Spleen: Normal in size without focal abnormality. Adrenals/Urinary Tract: No urinary tract calculi or obstructive uropathy. Left adrenals unremarkable. Right adrenal calcification may reflect previous infection or hemorrhage. Bladder is decompressed, limiting evaluation. Stomach/Bowel: No bowel obstruction or ileus. No bowel wall thickening or inflammatory change. Vascular/Lymphatic: Evaluation of the vasculature is limited on this unenhanced exam. Numerous venous collaterals within the upper abdomen likely sequela of portal venous hypertension. Mild atherosclerosis of the abdominal aorta. No pathologic adenopathy. Reproductive: Status post hysterectomy. No adnexal masses. Other: Trace ascites. Body wall edema lower abdomen and pelvis unchanged since earlier MRI. No abdominal wall hernia. Musculoskeletal: No acute or destructive bony lesions. Reconstructed images demonstrate no additional findings. IMPRESSION: 1. Unremarkable unenhanced appearance of the kidneys. 2. Hepatic steatosis, with heterogeneous nodularity consistent with cirrhosis. Please refer to preceding MRI performed earlier today for detailed description of the liver findings. 3. Patchy bibasilar airspace disease consistent with multifocal pneumonia, stable. 4. Trace ascites. 5. Stable body wall edema. 6.  Aortic Atherosclerosis  (ICD10-I70.0). Electronically Signed   By: Randa Ngo M.D.   On: 05/15/2020 19:36   DG Chest 2 View  Result Date: 05/25/2020 CLINICAL DATA:  Chest pain. EXAM: CHEST - 2 VIEW COMPARISON:  CT chest May 19, 2020.  Chest radiograph May 18, 2020. FINDINGS: No substantial change in multifocal bilateral nodular consolidation, better characterized on recent CT chest. No visible pleural effusions or pneumothorax. Similar borderline enlargement the cardiac silhouette. No evidence of acute osseous abnormality. IMPRESSION: No substantial change in bilateral nodular consolidation, better characterized on recent CT chest and concerning for multifocal pneumonia. Electronically Signed   By: Margaretha Sheffield MD   On: 05/25/2020 16:45   DG Chest 2 View  Result Date: 05/13/2020 CLINICAL DATA:  Pedal edema.  Bilateral foot swelling for 2 weeks. EXAM: CHEST - 2 VIEW COMPARISON:  Included portion from coronary CT 02/06/2020, chest radiograph 08/21/2019 FINDINGS: Left costophrenic angle excluded from the field of view. Lower lung volumes from prior exam. There is diffuse interstitial and bronchial thickening. Superimposed patchy airspace opacity in the left greater than right perihilar lungs. The heart is normal in size. Unchanged mediastinal contours. No significant pleural effusion. No pneumothorax. No acute osseous abnormalities are seen. IMPRESSION: 1. Diffuse interstitial and bronchial thickening which may be due to bronchial inflammation or congestive. 2. Additional patchy bilateral perihilar opacities, more typical of multifocal infection. Recommend correlation for infectious symptoms. Electronically Signed   By: Keith Rake M.D.   On: 05/13/2020 17:51   DG Abd 1 View  Result Date: 06/01/2020 CLINICAL DATA:  Nausea vomiting. EXAM: ABDOMEN - 1 VIEW COMPARISON:  05/31/2020 FINDINGS: NG tube noted in the stomach with moderate gaseous distension of the stomach. No gaseous small or large bowel dilatation within  the abdomen or pelvis. Visualized bony anatomy unremarkable. IMPRESSION: 1. NG tube in the stomach with mild gaseous distention of the stomach. 2. No evidence for bowel obstruction. Electronically Signed   By: Misty Stanley M.D.   On: 06/01/2020 16:43   DG Abd 1 View  Result Date: 05/31/2020 CLINICAL DATA:  60 year old female with history of orogastric tube placement. EXAM: ABDOMEN - 1 VIEW COMPARISON:  Chest x-ray 05/30/2020. FINDINGS: Orogastric tube coiled in the stomach. Visualized bowel gas pattern is nonobstructive. No gross evidence of pneumoperitoneum on this single supine view. Patchy multifocal airspace disease and interstitial prominence throughout the visualize lung bases, concerning for severe multilobar bilateral pneumonia. IMPRESSION: 1. Orogastric tube is coiled in the stomach. Electronically Signed   By: Vinnie Langton M.D.   On: 05/31/2020 09:16   DG Abd 1 View  Result Date: 05/30/2020 CLINICAL DATA:  OG tube placement EXAM: ABDOMEN - 1 VIEW COMPARISON:  None. FINDINGS: Enteric tube coiled in the stomach. No bowel dilatation to suggest obstruction. No evidence of pneumoperitoneum, portal venous gas or pneumatosis. No pathologic calcifications along the expected course of the ureters. Interstitial thickening at the lung bases bilaterally. IMPRESSION: 1. Enteric tube coiled in the stomach. Electronically Signed   By: Kathreen Devoid   On: 05/30/2020 13:35   CT CHEST WO CONTRAST  Result Date: 05/19/2020 CLINICAL DATA:  Shortness of breath. EXAM: CT CHEST WITHOUT CONTRAST TECHNIQUE: Multidetector CT imaging of the chest was performed following the standard protocol without IV contrast. COMPARISON:  Chest x-ray from yesterday. CT chest dated April 04, 2019. FINDINGS: Cardiovascular: No significant vascular findings. Normal heart size. No pericardial effusion. No thoracic aortic aneurysm. Coronary, aortic arch, and branch vessel atherosclerotic vascular disease. Mediastinum/Nodes: Multiple  prominent subcentimeter mediastinal lymph nodes have slightly increased in size since the prior study and are likely reactive. No enlarged axillary lymph nodes. The thyroid gland, trachea, and esophagus demonstrate no significant findings. Lungs/Pleura: Patchy ground-glass densities and small nodular consolidations throughout both lungs. No  cavitary lesion. Unchanged partially calcified 1.6 cm right apical nodule. No pleural effusion or pneumothorax. Upper Abdomen: No acute abnormality. Unchanged nodular liver contour and diffusely decreased hepatic density. Unchanged mild splenomegaly. Chronic right adrenal gland calcifications. Musculoskeletal: No chest wall mass or suspicious bone lesions identified. IMPRESSION: 1. Patchy ground-glass densities and small nodular consolidations throughout both lungs, consistent with multifocal pneumonia. Atypical viral pneumonia is a consideration. No cavitary lesions. 2. Cirrhosis and sequelae of portal hypertension. 3. Aortic Atherosclerosis (ICD10-I70.0). Electronically Signed   By: Titus Dubin M.D.   On: 05/19/2020 12:22   CT ABDOMEN PELVIS W CONTRAST  Result Date: 05/25/2020 CLINICAL DATA:  Nausea and vomiting. Left upper quadrant and epigastric pain. EXAM: CT ABDOMEN AND PELVIS WITH CONTRAST TECHNIQUE: Multidetector CT imaging of the abdomen and pelvis was performed using the standard protocol following bolus administration of intravenous contrast. CONTRAST:  151mL OMNIPAQUE IOHEXOL 300 MG/ML  SOLN COMPARISON:  CT of the chest May 19, 2020. CT of the abdomen and pelvis May 15, 2020. FINDINGS: Lower chest: Patchy infiltrates in the lung bases are again identified, worsened since May 15, 2020 and similar since May 19, 2020. No other acute abnormalities in the lung bases. Hepatobiliary: Cirrhotic changes to the liver remain. The gallbladder remains distended without obvious wall thickening or focal adjacent fat stranding. The portal vein is patent. No liver  masses identified. Pancreas: Atrophy of the pancreas without focal mass or evidence of pancreatitis. No interval change or acute abnormality associated with the pancreas. Spleen: Normal in size without focal abnormality. Adrenals/Urinary Tract: Calcification of the right adrenal gland remains, unchanged, of no acute significance. The left adrenal gland is normal. The kidneys, ureters, and bladder are unremarkable. Stomach/Bowel: The stomach and small bowel are normal. An anastomotic ring is associated with the rectum consistent with previous surgery. A few scattered colonic diverticuli are seen without diverticulitis. No colonic wall thickening. The appendix is normal. Vascular/Lymphatic: Mild calcified atherosclerosis is seen in the nonaneurysmal aorta. No adenopathy. Reproductive: Status post hysterectomy. No adnexal masses. Other: Mild ascites in the abdomen, similar in the interval. Musculoskeletal: No acute or significant osseous findings. IMPRESSION: 1. Infiltrates in the lung bases are similar since May 19, 2020 consistent with multifocal pneumonia, possibly an atypical pneumonia. 2. Cirrhotic changes in the liver.  Mild ascites. 3. Continued mild distension of the gallbladder. No wall thickening. 4. Diverticulosis without diverticulitis. 5. Calcified atherosclerotic change in the nonaneurysmal aorta. Electronically Signed   By: Dorise Bullion III M.D   On: 05/25/2020 19:06   MR LIVER W WO CONTRAST  Result Date: 05/15/2020 CLINICAL DATA:  Cirrhosis. EXAM: MRI ABDOMEN WITHOUT AND WITH CONTRAST TECHNIQUE: Multiplanar multisequence MR imaging of the abdomen was performed both before and after the administration of intravenous contrast. CONTRAST:  45mL GADAVIST GADOBUTROL 1 MMOL/ML IV SOLN COMPARISON:  Abdominal ultrasound 05/09/2020. FINDINGS: Lower chest: Heart size upper normal. There is patchy airspace disease in both lower lungs, possibly atelectasis although pneumonia not excluded. Hepatobiliary:  Heterogeneous lobular liver is compatible with cirrhosis. Heterogeneous loss of signal intensity in the liver parenchyma on out of phase T1 imaging is consistent with steatosis. Tiny foci of arterial phase hyperenhancement are identified in the anterior right liver (see images 37, 39, and 48 of series 19). No focal restricted diffusion within the liver parenchyma. Gallbladder is distended without evidence of stone disease. No intrahepatic or extrahepatic biliary dilation. Pancreas: No focal mass lesion. Diffuse loss of parenchymal volume is compatible with atrophy. No discernible main duct dilatation.  Spleen:  No splenomegaly. No focal mass lesion. Adrenals/Urinary Tract: No adrenal nodule or mass. Kidneys unremarkable. Stomach/Bowel: Stomach is decompressed. Duodenum is normally positioned as is the ligament of Treitz. No small bowel or colonic dilatation within the visualized abdomen. Vascular/Lymphatic: No abdominal aortic aneurysm. No abdominal lymphadenopathy. Other:  Small volume ascites. Musculoskeletal: Diffuse body wall edema No focal suspicious marrow enhancement within the visualized bony anatomy. IMPRESSION: 1. Nodular liver compatible with reported history of cirrhosis. Tiny foci of arterial phase hyperenhancement in the anterior right liver are nonspecific ( LI-RADS 3). Follow-up MRI in 3 months recommended to ensure stability. 2. Steatosis. 3. Patchy airspace disease in both lower lungs, possibly atelectasis although pneumonia a distinct consideration. 4. Small volume ascites. 5. Diffuse body wall edema. Electronically Signed   By: Misty Stanley M.D.   On: 05/15/2020 09:51   US RENAL  Result Date: 05/15/2020 CLINICAL DATA:  Acute renal insufficiency EXAM: RENAL / URINARY TRACT ULTRASOUND COMPLETE COMPARISON:  Abdominal sonogram 06/16/2017, concurrently performed MRI examination of the abdomen FINDINGS: Right Kidney: Renal measurements: 13.2 x 6.4 x 7.8 cm = volume: 346 mL. Visualization of the  right kidney is quite poor due to overlying soft tissue and obscuration of the upper and lower pole by bowel gas. The visualized renal cortical thickness is preserved and cortical echogenicity is normal. No hydronephrosis. Left Kidney: Renal measurements: At least 10.0 x 6.3 x 6.4 cm = volume: 212 mL. The lower 1/2 of the left kidney is obscured by overlying bowel gas. The visualized upper pole the left kidney demonstrates normal cortical thickness and echogenicity. There is no hydronephrosis. Bladder: Partially decompressed. Other: None. IMPRESSION: Markedly limited, but unremarkable examination Electronically Signed   By: Fidela Salisbury MD   On: 05/15/2020 13:46   DG CHEST PORT 1 VIEW  Result Date: 06/02/2020 CLINICAL DATA:  Hypoxia EXAM: PORTABLE CHEST 1 VIEW COMPARISON:  May 30, 2020. FINDINGS: Endotracheal tube tip is 5.2 cm above the carina. Nasogastric tube tip and side port are below the diaphragm. Central catheter tip is in the superior vena cava. No pneumothorax. There is extensive airspace opacity throughout the lungs bilaterally, similar to most recent study. Heart is upper normal in size. Suspect a degree of pulmonary venous hypertension. IMPRESSION: Tube and catheter positions as described without pneumothorax. Widespread airspace opacity persists without appreciable change. Heart upper normal in size with a questionable degree of pulmonary vascular congestion. There may well be a degree of congestive heart failure and/or ARDS, although multifocal pneumonia must be of concern given this appearance. More than one of these entities may be present concurrently. Electronically Signed   By: Lowella Grip III M.D.   On: 06/02/2020 10:23   DG Chest Port 1 View  Result Date: 05/30/2020 CLINICAL DATA:  Respiratory failure EXAM: PORTABLE CHEST 1 VIEW COMPARISON:  Earlier today FINDINGS: Endotracheal tube tip is at the clavicular heads. Right IJ line with tip at the SVC. Confluent bilateral airspace  disease with low volume lungs. Normal heart size. No visible effusion or pneumothorax. IMPRESSION: 1. Improved endotracheal tube positioning. 2. New right IJ line without complicating feature. 3. Unchanged aeration. Electronically Signed   By: Monte Fantasia M.D.   On: 05/30/2020 08:50   DG CHEST PORT 1 VIEW  Result Date: 05/30/2020 CLINICAL DATA:  Endotracheal tube placement. EXAM: PORTABLE CHEST 1 VIEW COMPARISON:  May 30, 2020. FINDINGS: Stable cardiomediastinal silhouette. Endotracheal tube is in good position. Bilateral diffuse airspace opacities are noted concerning for pneumonia. No pneumothorax is  noted. Bony thorax is unremarkable. IMPRESSION: Endotracheal tube in grossly good position. Stable bilateral lung opacities as described above. Electronically Signed   By: Marijo Conception M.D.   On: 05/30/2020 08:46   DG CHEST PORT 1 VIEW  Result Date: 05/30/2020 CLINICAL DATA:  Worsening hypoxia EXAM: PORTABLE CHEST 1 VIEW COMPARISON:  Yesterday FINDINGS: Low volume chest with dense confluent bilateral airspace disease which may be progressed in density. No visible effusion or air leak. Normal heart size and stable non obscured mediastinal contours IMPRESSION: Confluent airspace disease which may be progressed from yesterday. No new abnormality. Electronically Signed   By: Monte Fantasia M.D.   On: 05/30/2020 06:07   DG Chest Port 1 View  Result Date: 06/16/2020 CLINICAL DATA:  Short of breath.  Cirrhosis EXAM: PORTABLE CHEST 1 VIEW COMPARISON:  05/25/2020 FINDINGS: Severe diffuse bilateral airspace disease with interval progression. This is relatively symmetric. No significant effusion. IMPRESSION: Severe diffuse bilateral airspace disease with significant progression. Electronically Signed   By: Franchot Gallo M.D.   On: 06/15/2020 13:40   DG CHEST PORT 1 VIEW  Result Date: 05/18/2020 CLINICAL DATA:  Acute kidney injury, cirrhosis, volume overload, shortness of breath EXAM: PORTABLE CHEST 1 VIEW  COMPARISON:  Portable exam 1042 hours compared to 05/12/2020 FINDINGS: Borderline enlargement of cardiac silhouette. Mediastinal contours normal. Patchy BILATERAL pulmonary infiltrates consistent with multifocal pneumonia. Infiltrates are asymmetrically greater in LEFT upper lobe. Question cavitary lesion LEFT upper lobe 2.9 x 2.6 cm. No pleural effusion or pneumothorax. Osseous structures unremarkable. IMPRESSION: Patchy BILATERAL pulmonary infiltrates consistent with multifocal pneumonia. Question cavitary nodular focus in the LEFT upper lobe 2.9 x 2.6 cm diameter; CT chest recommended to exclude cavitary pneumonia. Electronically Signed   By: Lavonia Dana M.D.   On: 05/18/2020 12:45   ECHOCARDIOGRAM COMPLETE  Result Date: 05/15/2020    ECHOCARDIOGRAM REPORT   Patient Name:   ELEANORE JUNIO Date of Exam: 05/15/2020 Medical Rec #:  [id number]      Height:       65.0 in Accession #:    [id number]     Weight:       236.1 lb Date of Birth:  Apr 12, 1960      BSA:          2.123 m Patient Age:    77 years       BP:           117/49 mmHg Patient Gender: F              HR:           74 bpm. Exam Location:  Inpatient Procedure: 2D Echo, Cardiac Doppler, Color Doppler and Intracardiac            Opacification Agent Indications:    CHF-Acute Diastolic V76.16  History:        Patient has no prior history of Echocardiogram examinations.                 COPD; Risk Factors:Current Smoker, Hypertension, Dyslipidemia                 and ETOH.  Sonographer:    Bernadene Person RDCS Referring Phys: [id number] [place]  1. Left ventricular ejection fraction, by estimation, is 60 to 65%. The left ventricle has normal function. The left ventricle has no regional wall motion abnormalities. Left ventricular diastolic parameters were normal.  2. Right ventricular systolic function is normal. The right ventricular size is normal.  There is normal pulmonary artery systolic pressure.  3. Left atrial size was mildly dilated.  4.  The mitral valve is normal in structure. Mild mitral valve regurgitation. No evidence of mitral stenosis.  5. The aortic valve is normal in structure. Aortic valve regurgitation is not visualized. No aortic stenosis is present.  6. The inferior vena cava is normal in size with greater than 50% respiratory variability, suggesting right atrial pressure of 3 mmHg. FINDINGS  Left Ventricle: Left ventricular ejection fraction, by estimation, is 60 to 65%. The left ventricle has normal function. The left ventricle has no regional wall motion abnormalities. Definity contrast agent was given IV to delineate the left ventricular  endocardial borders. The left ventricular internal cavity size was normal in size. There is no left ventricular hypertrophy. Left ventricular diastolic parameters were normal. Right Ventricle: The right ventricular size is normal. No increase in right ventricular wall thickness. Right ventricular systolic function is normal. There is normal pulmonary artery systolic pressure. Left Atrium: Left atrial size was mildly dilated. Right Atrium: Right atrial size was normal in size. Pericardium: There is no evidence of pericardial effusion. Mitral Valve: The mitral valve is normal in structure. Mild mitral valve regurgitation. No evidence of mitral valve stenosis. Tricuspid Valve: The tricuspid valve is normal in structure. Tricuspid valve regurgitation is mild . No evidence of tricuspid stenosis. Aortic Valve: The aortic valve is normal in structure. Aortic valve regurgitation is not visualized. No aortic stenosis is present. Pulmonic Valve: The pulmonic valve was normal in structure. Pulmonic valve regurgitation is not visualized. No evidence of pulmonic stenosis. Aorta: The aortic root is normal in size and structure. Venous: The inferior vena cava is normal in size with greater than 50% respiratory variability, suggesting right atrial pressure of 3 mmHg. IAS/Shunts: No atrial level shunt detected by  color flow Doppler.  LEFT VENTRICLE PLAX 2D LVIDd:         4.40 cm  Diastology LVIDs:         2.40 cm  LV e' medial:    8.92 cm/s LV PW:         0.80 cm  LV E/e' medial:  16.4 LV IVS:        0.80 cm  LV e' lateral:   9.03 cm/s LVOT diam:     1.70 cm  LV E/e' lateral: 16.2 LV SV:         83 LV SV Index:   39 LVOT Area:     2.27 cm  RIGHT VENTRICLE RV S prime:     11.50 cm/s TAPSE (M-mode): 3.0 cm LEFT ATRIUM             Index       RIGHT ATRIUM           Index LA diam:        4.10 cm 1.93 cm/m  RA Area:     12.70 cm LA Vol (A2C):   49.1 ml 23.13 ml/m RA Volume:   24.70 ml  11.64 ml/m LA Vol (A4C):   51.0 ml 24.03 ml/m LA Biplane Vol: 50.7 ml 23.88 ml/m  AORTIC VALVE LVOT Vmax:   165.00 cm/s LVOT Vmean:  113.000 cm/s LVOT VTI:    0.365 m  AORTA Ao Root diam: 2.70 cm Ao Asc diam:  2.70 cm MITRAL VALVE MV Area (PHT): 2.76 cm     SHUNTS MV Decel Time: 275 msec     Systemic VTI:  0.36 m MV E velocity: 146.00  cm/s  Systemic Diam: 1.70 cm MV A velocity: 71.00 cm/s MV E/A ratio:  2.06 Ena Dawley MD Electronically signed by Ena Dawley MD Signature Date/Time: 05/15/2020/2:31:06 PM    Final    [place] ASCITES (ABDOMEN LIMITED)  Result Date: 05/14/2020 CLINICAL DATA:  Abdominal distension, assess for ascites and paracentesis EXAM: LIMITED ABDOMEN ULTRASOUND FOR ASCITES TECHNIQUE: Limited ultrasound survey for ascites was performed in all four abdominal quadrants. COMPARISON:  03/11/2013 FINDINGS: Survey of the abdominal 4 quadrants demonstrates only a very minimal amount of right upper quadrant and right lower quadrant ascites. There is not enough fluid to warrant paracentesis. Procedure not performed. IMPRESSION: Trace abdominopelvic ascites. Electronically Signed   By: Jerilynn Mages.  Shick M.D.   On: 05/14/2020 13:27   US Abdomen Limited RUQ (LIVER/GB)  Result Date: 05/11/2020 CLINICAL DATA:  Cirrhosis screening EXAM: ULTRASOUND ABDOMEN LIMITED RIGHT UPPER QUADRANT COMPARISON:  Abdominal ultrasound 09/25/2019  FINDINGS: Gallbladder: Limited visualization. No gallstones or wall thickening visualized. No sonographic Murphy sign noted by sonographer. Common bile duct: Diameter: Not visualized on today's exam. Liver: No definite focal lesion identified. Diffusely increased liver parenchymal echogenicity. Contour nodularity. Portal vein is patent on color Doppler imaging with normal direction of blood flow towards the liver. Other: Mild to moderate four quadrant ascites, greatest in the right lower quadrant. IMPRESSION: 1. Limited evaluation secondary to shadowing bowel gas and body habitus. 2. Appearance of the liver in keeping with history of cirrhosis. No new focal lesion identified. 3.  Mild to moderate four quadrant ascites. Electronically Signed   By: Audie Pinto M.D.   On: 05/11/2020 13:28    Microbiology Recent Results (from the past 240 hour(s))  Resp Panel by RT-PCR (Flu A&B, Covid) Nasopharyngeal Swab     Status: None   Collection Time: 05/25/20  4:44 PM   Specimen: Nasopharyngeal Swab; Nasopharyngeal(NP) swabs in vial transport medium  Result Value Ref Range Status   SARS Coronavirus 2 by RT PCR NEGATIVE NEGATIVE Final    Comment: (NOTE) SARS-CoV-2 target nucleic acids are NOT DETECTED.  The SARS-CoV-2 RNA is generally detectable in upper respiratory specimens during the acute phase of infection. The lowest concentration of SARS-CoV-2 viral copies this assay can detect is 138 copies/mL. A negative result does not preclude SARS-Cov-2 infection and should not be used as the sole basis for treatment or other patient management decisions. A negative result may occur with  improper specimen collection/handling, submission of specimen other than nasopharyngeal swab, presence of viral mutation(s) within the areas targeted by this assay, and inadequate number of viral copies(<138 copies/mL). A negative result must be combined with clinical observations, patient history, and  epidemiological information. The expected result is Negative.  Fact Sheet for Patients:  EntrepreneurPulse.com.au  Fact Sheet for Healthcare Providers:  IncredibleEmployment.be  This test is no t yet approved or cleared by the [place] FDA and  has been authorized for detection and/or diagnosis of SARS-CoV-2 by FDA under an Emergency Use Authorization (EUA). This EUA will remain  in effect (meaning this test can be used) for the duration of the COVID-19 declaration under Section 564(b)(1) of the Act, 21 U.S.C.section 360bbb-3(b)(1), unless the authorization is terminated  or revoked sooner.       Influenza A by PCR NEGATIVE NEGATIVE Final   Influenza B by PCR NEGATIVE NEGATIVE Final    Comment: (NOTE) The Xpert Xpress SARS-CoV-2/FLU/RSV plus assay is intended as an aid in the diagnosis of influenza from Nasopharyngeal swab specimens and should not be used as  a sole basis for treatment. Nasal washings and aspirates are unacceptable for Xpert Xpress SARS-CoV-2/FLU/RSV testing.  Fact Sheet for Patients: EntrepreneurPulse.com.au  Fact Sheet for Healthcare Providers: IncredibleEmployment.be  This test is not yet approved or cleared by the [place] FDA and has been authorized for detection and/or diagnosis of SARS-CoV-2 by FDA under an Emergency Use Authorization (EUA). This EUA will remain in effect (meaning this test can be used) for the duration of the COVID-19 declaration under Section 564(b)(1) of the Act, 21 U.S.C. section 360bbb-3(b)(1), unless the authorization is terminated or revoked.  Performed at [facility], [place] [address], [place], [place] [address]   Resp Panel by RT-PCR (Flu A&B, Covid) Nasopharyngeal Swab     Status: None   Collection Time: 06/02/2020 12:47 PM   Specimen: Nasopharyngeal Swab; Nasopharyngeal(NP) swabs in vial transport medium  Result Value Ref  Range Status   SARS Coronavirus 2 by RT PCR NEGATIVE NEGATIVE Final    Comment: (NOTE) SARS-CoV-2 target nucleic acids are NOT DETECTED.  The SARS-CoV-2 RNA is generally detectable in upper respiratory specimens during the acute phase of infection. The lowest concentration of SARS-CoV-2 viral copies this assay can detect is 138 copies/mL. A negative result does not preclude SARS-Cov-2 infection and should not be used as the sole basis for treatment or other patient management decisions. A negative result may occur with  improper specimen collection/handling, submission of specimen other than nasopharyngeal swab, presence of viral mutation(s) within the areas targeted by this assay, and inadequate number of viral copies(<138 copies/mL). A negative result must be combined with clinical observations, patient history, and epidemiological information. The expected result is Negative.  Fact Sheet for Patients:  EntrepreneurPulse.com.au  Fact Sheet for Healthcare Providers:  IncredibleEmployment.be  This test is no t yet approved or cleared by the [place] FDA and  has been authorized for detection and/or diagnosis of SARS-CoV-2 by FDA under an Emergency Use Authorization (EUA). This EUA will remain  in effect (meaning this test can be used) for the duration of the COVID-19 declaration under Section 564(b)(1) of the Act, 21 U.S.C.section 360bbb-3(b)(1), unless the authorization is terminated  or revoked sooner.       Influenza A by PCR NEGATIVE NEGATIVE Final   Influenza B by PCR NEGATIVE NEGATIVE Final    Comment: (NOTE) The Xpert Xpress SARS-CoV-2/FLU/RSV plus assay is intended as an aid in the diagnosis of influenza from Nasopharyngeal swab specimens and should not be used as a sole basis for treatment. Nasal washings and aspirates are unacceptable for Xpert Xpress SARS-CoV-2/FLU/RSV testing.  Fact Sheet for  Patients: EntrepreneurPulse.com.au  Fact Sheet for Healthcare Providers: IncredibleEmployment.be  This test is not yet approved or cleared by the [place] FDA and has been authorized for detection and/or diagnosis of SARS-CoV-2 by FDA under an Emergency Use Authorization (EUA). This EUA will remain in effect (meaning this test can be used) for the duration of the COVID-19 declaration under Section 564(b)(1) of the Act, 21 U.S.C. section 360bbb-3(b)(1), unless the authorization is terminated or revoked.  Performed at [facility], [place] [address] [place], [place] [address]   MRSA PCR Screening     Status: None   Collection Time: 05/23/2020  4:45 PM   Specimen: Nasopharyngeal  Result Value Ref Range Status   MRSA by PCR NEGATIVE NEGATIVE Final    Comment:        The GeneXpert MRSA Assay (FDA approved for NASAL specimens only), is one component of a  comprehensive MRSA colonization surveillance program. It is not intended to diagnose MRSA infection nor to guide or monitor treatment for MRSA infections. Performed at [facility], [place] [address] [place], [place] [address]   Expectorated Sputum Assessment w Gram Stain, Rflx to Resp Cult     Status: None   Collection Time: 05/30/20 12:15 AM   Specimen: Expectorated Sputum  Result Value Ref Range Status   Specimen Description EXPECTORATED SPUTUM  Final   Special Requests NONE  Final   Sputum evaluation   Final    THIS SPECIMEN IS ACCEPTABLE FOR SPUTUM CULTURE Performed at [facility], [place] [address] [place] [address] [place], [place] [address]    Report Status 05/30/2020 FINAL  Final  Culture, Respiratory w Gram Stain     Status: None   Collection Time: 05/30/20 12:15 AM  Result Value Ref Range Status   Specimen Description   Final    EXPECTORATED SPUTUM Performed at [facility], [place] [address] [place], [place] [address]     Special Requests   Final    NONE Reflexed from [id number] Performed at [facility], [place] [address], [place], [place] [address]    Gram Stain   Final    ABUNDANT WBC PRESENT,BOTH PMN AND MONONUCLEAR NO ORGANISMS SEEN    Culture   Final    RARE Normal respiratory flora-no Staph aureus or Pseudomonas seen Performed at [place] [address] [place], [place] [address]    Report Status 06/02/2020 FINAL  Final  Culture, Respiratory w Gram Stain     Status: None   Collection Time: 05/30/20 11:33 AM   Specimen: Tracheal Aspirate; Respiratory  Result Value Ref Range Status   Specimen Description   Final    TRACHEAL ASPIRATE Performed at [place] [address], [place], [place] [address]    Special Requests   Final    NONE Performed at [facility], [place] [address], [place], [place] [address]    Gram Stain   Final    FEW WBC PRESENT,BOTH PMN AND MONONUCLEAR NO ORGANISMS SEEN    Culture   Final    NO GROWTH Performed at [facility], [place] [address], [place], [place] [address]    Report Status 06/02/2020 FINAL  Final  Culture, blood (routine x 2)     Status: None (Preliminary result)   Collection Time: 05/31/20  9:40 AM   Specimen: BLOOD  Result Value Ref Range Status   Specimen Description   Final    BLOOD RIGHT ANTECUBITAL Performed at [place] [address], [place], [place] [address]    Special Requests   Final    BOTTLES DRAWN AEROBIC AND ANAEROBIC Blood Culture results may not be optimal due to an inadequate volume of blood received in culture bottles Performed at [place] [address] [address], [place], [place] [address]    Culture   Final    NO GROWTH 4 DAYS Performed at [facility], [place] [address], [place], [place] [address]    Report Status PENDING  Incomplete  Culture, blood (routine x 2)     Status: None (Preliminary result)   Collection  Time: 05/31/20  9:40 AM   Specimen: BLOOD  Result Value Ref Range Status   Specimen Description   Final    BLOOD LEFT ANTECUBITAL Performed at [place] [address], [place], [place] [address]  Special Requests   Final    BOTTLES DRAWN AEROBIC AND ANAEROBIC Blood Culture results may not be optimal due to an inadequate volume of blood received in culture bottles Performed at [place] [address] [place], [place] [address]    Culture   Final    NO GROWTH 4 DAYS Performed at [facility], [place] [address] [place], [place] [address]    Report Status PENDING  Incomplete  Culture, Urine     Status: Abnormal   Collection Time: 06/02/20 11:08 AM   Specimen: Urine, Catheterized  Result Value Ref Range Status   Specimen Description   Final    URINE, CATHETERIZED Performed at [place] [address] [place], [place] [address]    Special Requests   Final    NONE Performed at [facility], [place] [address] [place], [place] [address]    Culture (A)  Final    <10,000 COLONIES/mL INSIGNIFICANT GROWTH Performed at [place] [address] [place], [place] [address]    Report Status 06/04/2020 FINAL  Final    Lab Basic Metabolic Panel: Recent Labs  Lab 05/30/20 0243 05/31/20 0230 06/01/20 0500 06/02/20 0559 2020/06/04 0410  NA 139 134* 135 138 135  K 3.4* 4.4 3.8 3.8 3.9  CL 105 103 104 109 106  CO2 24 21* 21* 21* 21*  GLUCOSE 142* 224* 235* 167* 192*  BUN 15 25* 35* 37* 42*  CREATININE 0.88 1.42* 1.27* 1.10* 1.23*  CALCIUM 8.0* 7.7* 8.0* 8.1* 8.0*  MG 1.3* 2.3  --   --   --   PHOS 3.2 2.4*  --   --   --    Liver Function Tests: Recent Labs  Lab 05/24/2020 1233 06/04/20 0410  AST 146* 83*  ALT 43 23  ALKPHOS 147* 122  BILITOT 4.2* 3.3*  PROT 6.8 6.7  ALBUMIN 2.8* 2.0*   Recent Labs  Lab 06/01/2020 1547  LIPASE 22   Recent Labs  Lab 06/10/2020 1233   AMMONIA 35   CBC: Recent Labs  Lab 05/30/20 0243 05/31/20 0230 06/01/20 0500 06/02/20 0559 06/04/20 0410  WBC 14.5* 17.6* 12.7* 11.8* 17.1*  HGB 9.9* 9.6* 8.7* 8.9* 8.7*  HCT 30.4* 29.7* 26.9* 27.9* 27.5*  MCV 109.0* 108.8* 108.9* 109.8* 112.7*  PLT 352 532* 386 342 367   Cardiac Enzymes: No results for input(s): CKTOTAL, CKMB, CKMBINDEX, TROPONINI in the last 168 hours. Sepsis Labs: Recent Labs  Lab 05/30/20 0243 05/31/20 0230 06/01/20 0500 06/02/20 0559 2020-06-04 0410  PROCALCITON 0.81 4.61  --   --   --   WBC 14.5* 17.6* 12.7* 11.8* 17.1*    Procedures/Operations  As per EMR   Bonna Gains Harvey Matlack 06/04/2020, 9:21 AM

## 2020-06-22 NOTE — Progress Notes (Signed)
eLink Physician-Brief Progress Note Patient Name: [name] [name] DOB: [birth year] MRN: [id number]   Date of Service  2020-06-12  HPI/Events of Note  ABG results noted.  eICU Interventions  Respiratory rate increased to 30 bpm on the ventilator.        Kerry Kass Mahin Guardia 06-12-2020, 4:27 AM

## 2020-06-22 NOTE — Progress Notes (Signed)
NAME:  [name] [name], MRN:  [id number], DOB:  [birth year], LOS: 5 ADMISSION DATE:  06/18/2020, CONSULTATION DATE:  06/04/2020 REFERRING MD:  Dr Florene Glen, CHIEF COMPLAINT:  persistent hypoxia  History of Present Illness:  [name] [name] is a 60 y.o. female with PMX significant for but not limited to seizures, right pulmonary nodule, HTN, HLD, prior ETOH abuse with resultant cirrhosis, CKD, anxiety, and depression who presented with continued complaints of progressive hypoxia. Of note patient was recently treated at this facility 3/23 - 4/1 for multifocal PNA with ARF secondary to hepatorenal syndrome. She was discharged on Augmentin. She states she did not feel completely back to baseline by discharge but she feels significantly worse now. In addition to SOB she also reports some chills, fatigue, persistent productive cough with thick yellow/green sputum, nausea, and questionable thrush.SOB is worsened by exertion and lying flat. States she missed one dose of Augmentin due to nausea. She denies any smoking or vaping since March of 2021.   On EMS arrival she was seen hypoxic at 62% on RA. On ED arrival oxygen sats 93% on 15L [place] with all other vitals WNL. She later became more hypoxic on NRB resulting in application of BIPAP in ED. Pertinent labs include; alkaline phos 146, total albumin 4.2, BNP 366, WBC 16.5, Hgb 9.9. Given high supplemental oxygen need and pulmonary history PCCM was consulted for further management.   Pertinent  Medical History   Past Medical History:  Diagnosis Date  . ABDOMINAL PAIN -GENERALIZED 09/27/2008   Qualifier: Diagnosis of  By: Trellis Paganini PA-c, Amy S   . Acute epigastric pain 04/27/2011  . Acute pancreatitis 08/12/2012  . Allergic rhinitis 09/05/2017  . Anal fistula   . Anemia   . Anxiety   . Arthritis   . BIPOLAR AFFECTIVE DISORDER 12/06/2006   Qualifier: History of  By: Elsworth Soho MD, Leanna Sato Bipolar disorder ([place])   . Chronic kidney disease   . COPD (chronic  obstructive pulmonary disease) ([place]) 11/08/2017  . Cough 12/06/2006   Annotation: chronic since'96, nml spirometry, +ve methacholine challenge, nml  CT sinuses, inconclusive pH probe, esophageal manometry Centricity Description: SYMPTOM, COUGH Qualifier: History of  By: Elsworth Soho MD, Leanna Sato   Centricity Description: COUGH Qualifier: Diagnosis of  By: Elsworth Soho MD, Leanna Sato    . Depressive disorder, not elsewhere classified   . Diarrhea 09/27/2008   Qualifier: Diagnosis of  By: Trellis Paganini PA-c, Amy S   . Diverticulosis of colon (without mention of hemorrhage)   . Elevated liver enzymes 10/31/2015  . Esophageal reflux   . ETOH abuse 08/14/2012  . Gastritis 04/28/2011  . GERD (gastroesophageal reflux disease)   . Hematochezia 08/14/2012  . Hemoptysis 05/26/2017  . HEMORRHOIDS 09/12/2007   Qualifier: Diagnosis of  By: Nils Pyle CMA ([place]), Mearl Latin    . Hepatic steatosis   . HIATAL HERNIA 09/12/2007   Qualifier: Diagnosis of  By: Nils Pyle CMA ([place]), Mearl Latin    . History of recurrent UTIs   . Hyperlipidemia   . Hypertension   . Hypertensive retinopathy of both eyes 11/24/2015  . INFECTIOUS DIARRHEA 09/27/2008   Qualifier: Diagnosis of  By: Laney Potash, Pam    . Iron deficiency   . Iron overload 09/22/2017  . Nausea alone 09/27/2008   Qualifier: Diagnosis of  By: Laney Potash, [place] 09/27/2008   Qualifier: Diagnosis of  By: Shane Crutch, Amy S   . Obesity, Class III, BMI 40-49.9 (morbid  obesity) ([place]) 04/27/2011  . Personal history of colonic polyps 10/16/2009   hyperplastic  . Polyarthralgia 12/02/2015   Evaluated by Dr. Charlestine Night (rheumatologist) 12/29/2015:1): 1)possible fibromyalgia (Suggest physical therapy and flexeril), 2) Polyarthralgia/Insomnia (suggest use of robaxin or flexeril), 3) Nicotine Dependence (counseled to quit). 4)Depression (continue therapy with psychiatrist)  . Pre-diabetes   . Pseudocyst of pancreas 08/12/2012  . Pulmonary nodule, right 11/29/2016  . Rectal fissure  05/11/2011  . Rectal fistula 05/11/2011  . RECTAL PAIN 09/12/2007   Qualifier: Diagnosis of  By: Nils Pyle CMA ([place]), Mearl Latin    . Secondary hemochromatosis 09/22/2017  . Seizures ([place])   . SIRS (systemic inflammatory response syndrome) ([place]) 03/11/2013  . Solitary rectal ulcer 04/12/2011  . TOBACCO ABUSE 12/06/2006   Qualifier: History of  By: Elsworth Soho MD, Leanna Sato.    . Ulcerative proctitis, nonspecific ([place]) 03/10/2011  . Varicose veins of both lower extremities 12/02/2015     Significant Hospital Events: Including procedures, antibiotic start and stop dates in addition to other pertinent events   . Admitted for persistent hypoxemia requiring BiPAP application . Decompensated overnight and intubated early 05/30/2020 . Central venous access placement 05/30/2020 . 4/9 new fever, added vanc back to cefepime, CXR looks wet as does CT lung portion of CT AP . 4/10 Cr, UOP better . 4/11 Creatinine continues to improve mains on basal and levo . 4/12 required propofol and increased pressors overnight, respiratory acidosis and generally worsening prognosis.  Goals of care discussion with patient's sister and Dr. Silas Flood, plan to transition to comfort care later today   Micro 4/7 MRSA>> negative 4/8 sputum CX>> no growth final 4/9 blood cultures x2>>   Antibiotics Azithromycin 4/7 Doxycycline 4/7 Cefepime 4/7-4/12 Vancomycin 4/7-4/12  Tubes/lines ETT 4/8- Right IJ CVC 4/8-  Interim History / Subjective:    Goals of care discussion with sister this morning, she feels that patient would not want prolonged life support and would like to transition to comfort based care.  Plan to transition once patient's friend arrives to the hospital.  Objective   Blood pressure (!) 128/55, pulse 89, temperature 98 F (36.7 C), temperature source Oral, resp. rate (!) 30, height 5\' 5"  (1.651 m), weight 105.1 kg, SpO2 98 %. CVP:  [17 mmHg-30 mmHg] 17 mmHg  Vent Mode: PRVC FiO2 (%):  [70 %-90 %] 80 % Set Rate:   [24 bmp-30 bmp] 30 bmp Vt Set:  [500 mL] 500 mL PEEP:  [16 cmH20] 16 cmH20 Plateau Pressure:  [26 cmH20-44 cmH20] 44 cmH20   Intake/Output Summary (Last 24 hours) at 2020/07/03 0932 Last data filed at 07/03/2020 0700 Gross per 24 hour  Intake 3637.2 ml  Output 1795 ml  Net 1842.2 ml   Filed Weights   06/01/20 0500 06/02/20 0500 Jul 03, 2020 0405  Weight: 103 kg 107.1 kg 105.1 kg    General: Intubated and sedated, grossly edematous HEENT: MM pink/moist, ETT in place Neuro: Unresponsive, sedated, not triggering breaths on the vent CV: s1s2 tachycardic, no m/r/g PULM: Rhonchi bilaterally, on full vent support with elevated plateau pressures and requiring PEEP of 16, 80% FiO2 GI: soft, bsx4 active  Extremities: warm/dry, 2+ edema  Skin: no rashes or lesions   Labs/imaging that I havepersonally reviewed    ABG-respiratory acidosis CBC, BMP,  CXR>> persistent widespread airspace opacities  Resolved Hospital Problem list     Assessment & Plan:     Acute on chronic hypoxemic respiratory failure Likely secondary to pneumonia with some element of pulmonary edema and ARDS Continues  to require high FiO2 and PEEP For goals of care discussion with patient's sister, she would not want prolonged life support P: -Continue vent support until all family and friends that sister would like to visit her at the bedside then transition to full comfort care    Septic Shock: Possibly secondary to pneumonia, though respiratory and blood cultures have been negative Bedside ultrasound with no ascites on admission Had Leuks and WBC's on admission, no UC sent P: -Continue antibiotics   AKI: Possible HRS vs ATN from hypotension/shock or both vs elevated venous pressures. Cr and UOP improved with higher MAP target for HRS (MAP 75) P: -Comfort care as above    Hyperbilirubinemia, Cirrhosis AST 146 and bilirubin 4.8 on admission CT scan 4/3 with gallbladder distention but no wall thickening  or stones P: -Comfort care  History of COPD -Continue bronchodilators  Nutrition:  -Stop tube feeds, D10  Agitation -Discontinue Dilaudid drip and as needed Ativan IV  Hypertension -Hold antihypertensives at present   Best practice (right click and "Reselect all SmartList Selections" daily)  Diet:  NPO Pain/Anxiety/Delirium protocol (if indicated): Yes (RASS goal -2) VAP protocol (if indicated): Yes  DVT prophylaxis: Contraindicated. Comfort care GI prophylaxis: N/A Glucose control:  SSI No Central venous access:  Yes, and it is no longer needed Arterial line:  N/A Foley:  Yes, and it is no longer needed Mobility:  bed rest  PT consulted: N/A Last date of multidisciplinary goals of care discussion: 4/12 transition to full comfort care when family ready later today Code Status:  DNR Disposition: icu  Labs   Reviewed and as per EMR  CRITICAL CARE Performed by: Otilio Carpen Eleftheria Taborn   Total critical care time: 33  minutes  Critical care time was exclusive of separately billable procedures and treating other patients.  Critical care was necessary to treat or prevent imminent or life-threatening deterioration.  Critical care was time spent personally by me on the following activities: development of treatment plan with patient and/or surrogate as well as nursing, discussions with consultants, evaluation of patient's response to treatment, examination of patient, obtaining history from patient or surrogate, ordering and performing treatments and interventions, ordering and review of laboratory studies, ordering and review of radiographic studies, pulse oximetry and re-evaluation of patient's condition.   Otilio Carpen Jalaine Riggenbach, PA-C  Pulmonary & Critical care See Amion for pager If no response to pager , please call [phone] until 7pm After 7:00 pm call [place]  [phone]

## 2020-06-22 DEATH — deceased

## 2020-07-03 ENCOUNTER — Ambulatory Visit: Payer: PPO | Admitting: Internal Medicine

## 2022-09-09 IMAGING — US US ABDOMEN LIMITED RUQ/ASCITES
1 series · 14 of 25 positions shown · non-contrast
Comparison: Abdominal ultrasound 09/25/2019

CLINICAL DATA: Cirrhosis screening

EXAM:
ULTRASOUND ABDOMEN LIMITED RIGHT UPPER QUADRANT

[Series 1: us abdomen limited ruq/ascites · 0.26mm/px · 14 of 47 slices shown]
[im 1/47]
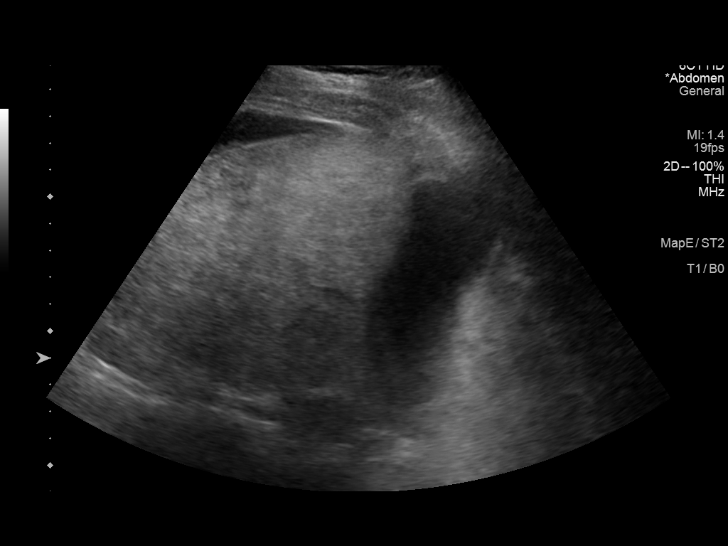
[im 4/47]
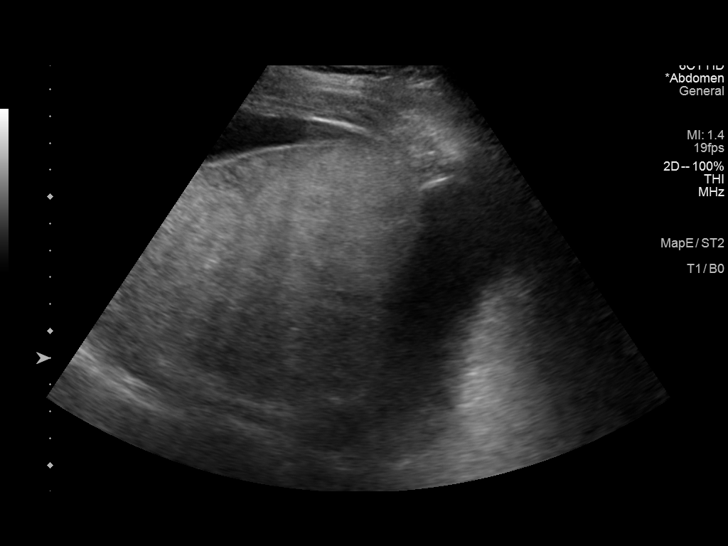
[im 8/47]
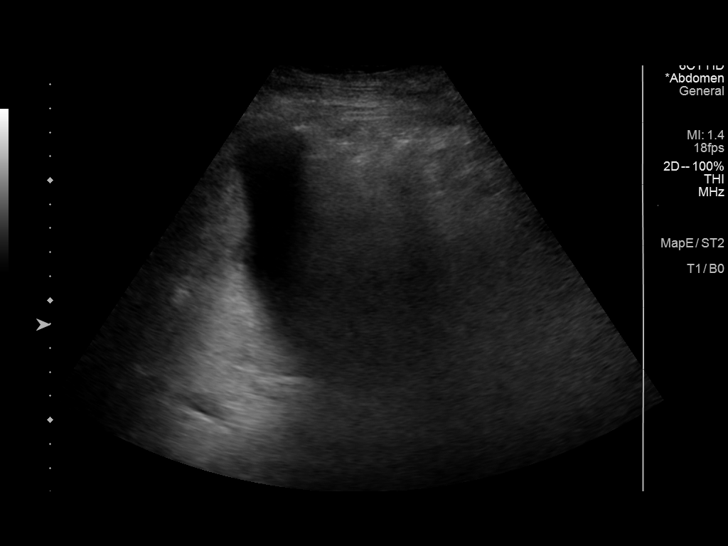
[im 12/47]
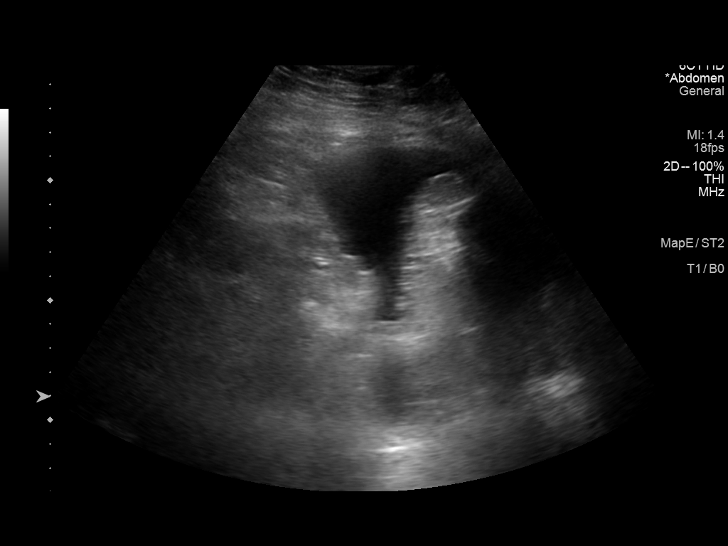
[im 16/47]
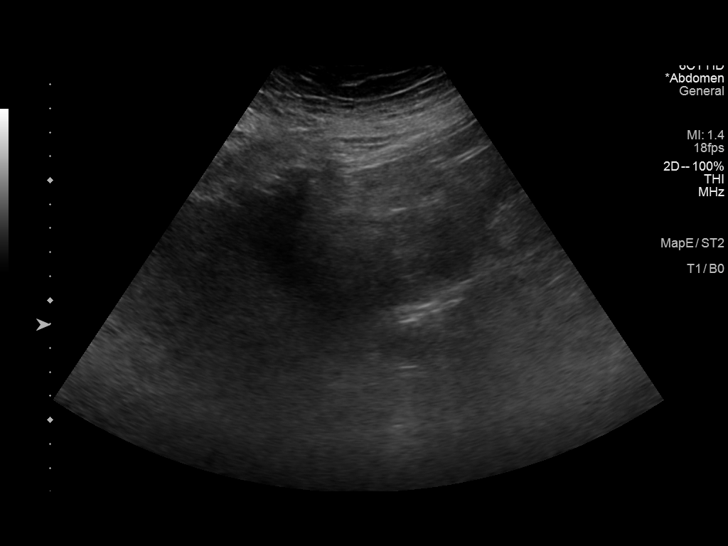
[im 18/47]
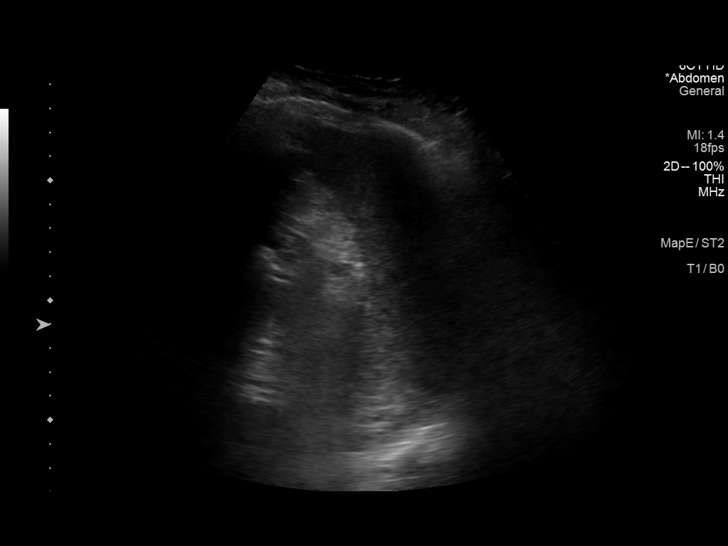
[im 22/47]
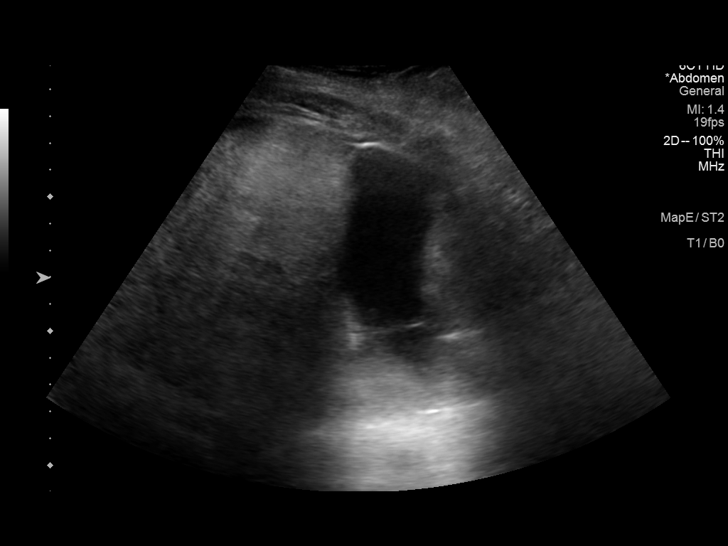
[im 25/47]
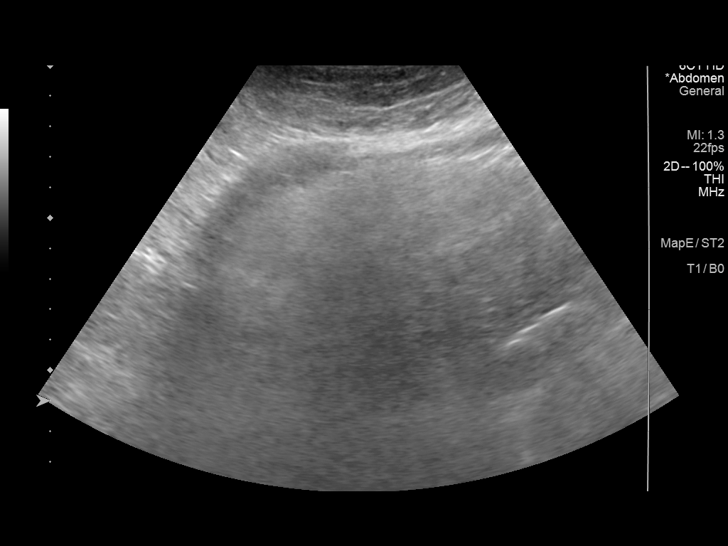
[im 29/47]
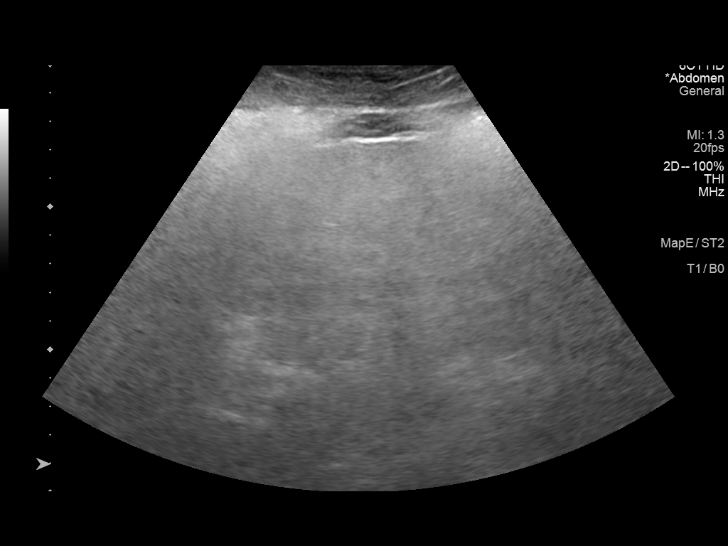
[im 31/47]
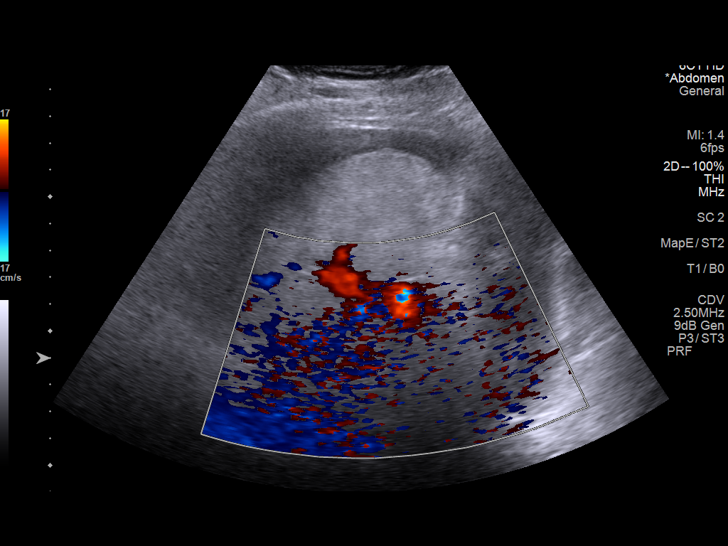
[im 35/47]
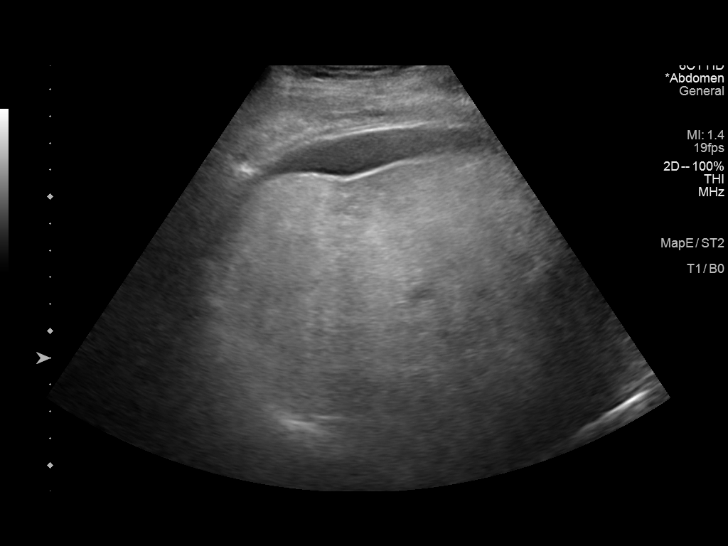
[im 39/47]
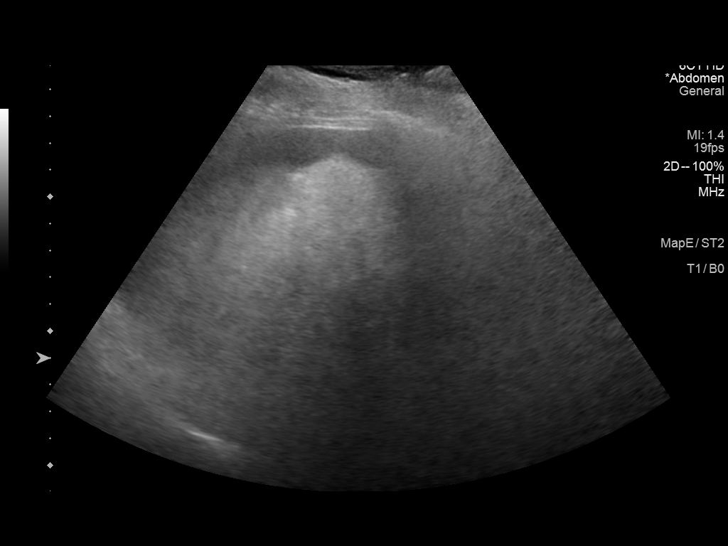
[im 43/47]
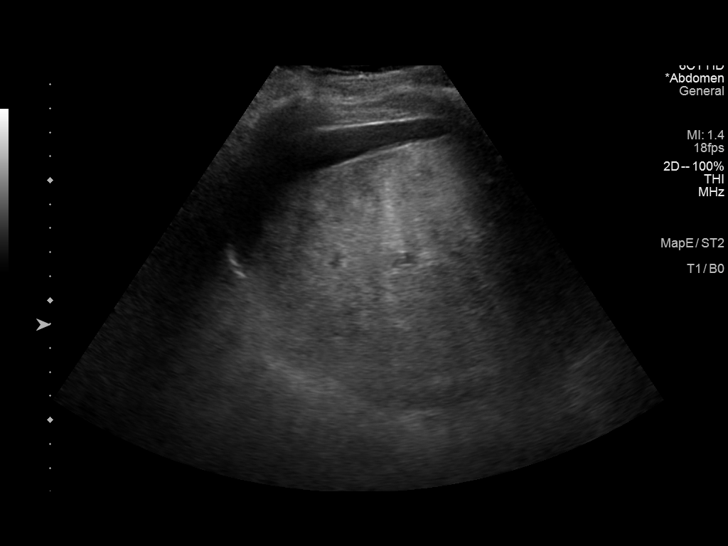
[im 47/47]
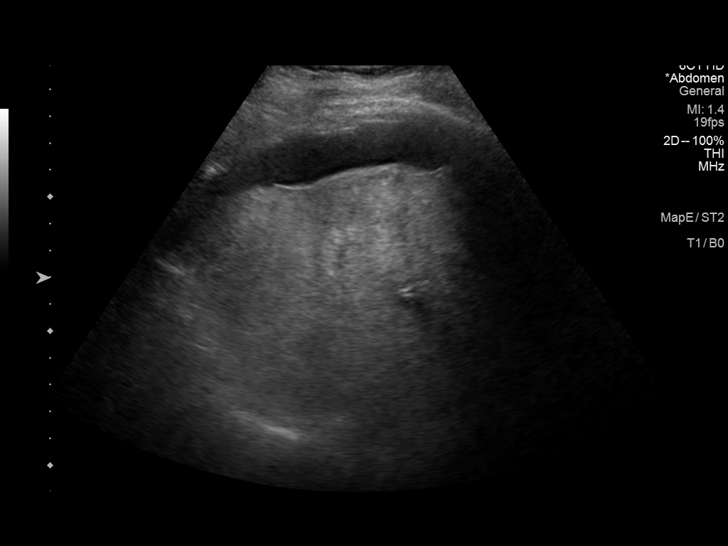

[14 of 25 positions shown; findings below may reference images not displayed]

FINDINGS: Gallbladder:

Limited visualization. No gallstones or wall thickening visualized.
No sonographic Murphy sign noted by sonographer.

Common bile duct:

Diameter: Not visualized on today's exam.

Liver:

No definite focal lesion identified. Diffusely increased liver
parenchymal echogenicity. Contour nodularity. Portal vein is patent
on color Doppler imaging with normal direction of blood flow towards
the liver.

Other: Mild to moderate four quadrant ascites, greatest in the right
lower quadrant.
IMPRESSION: 1. Limited evaluation secondary to shadowing bowel gas and body
habitus.

2. Appearance of the liver in keeping with history of cirrhosis. No
new focal lesion identified.

3.  Mild to moderate four quadrant ascites.

## 2022-09-12 IMAGING — DX DG CHEST 2V
3 series · 3 of 3 positions shown · non-contrast
Comparison: Included portion from coronary CT 02/06/2020, chest
radiograph 08/21/2019

CLINICAL DATA: Pedal edema.  Bilateral foot swelling for 2 weeks.

EXAM:
CHEST - 2 VIEW

[chest pa (1 of 2)]
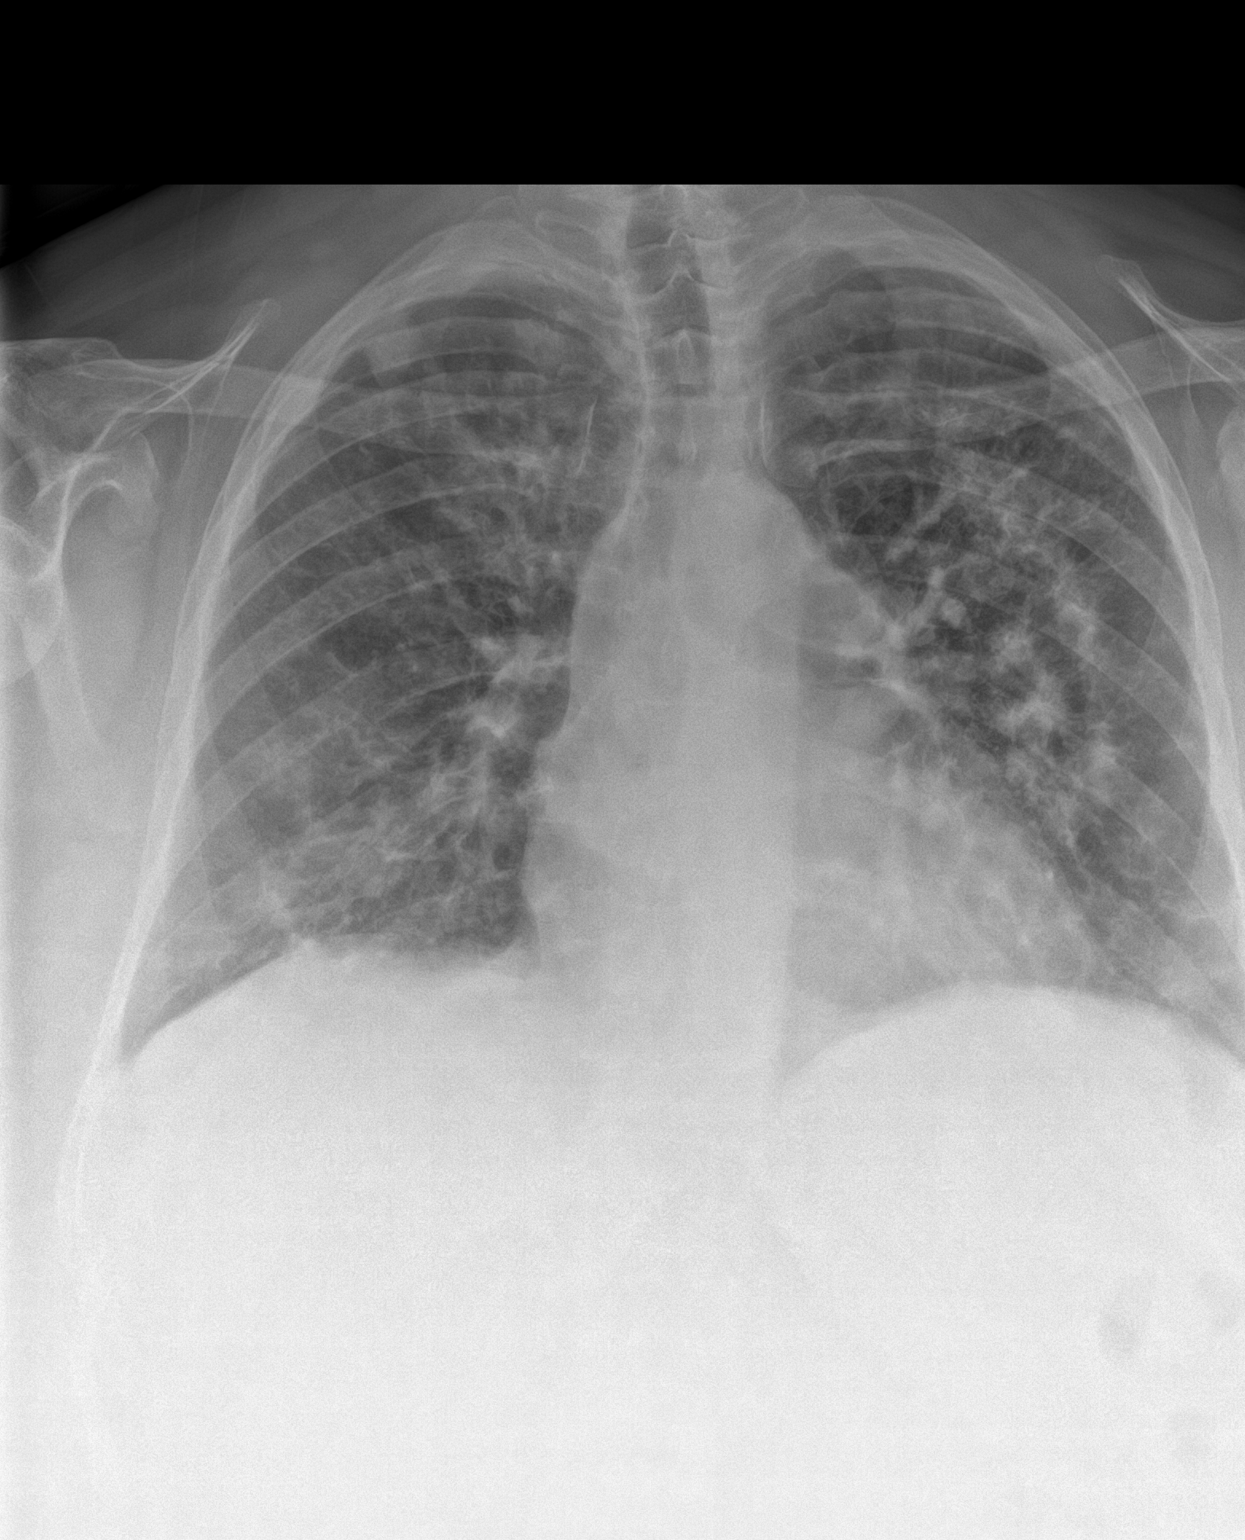

[chest lat]
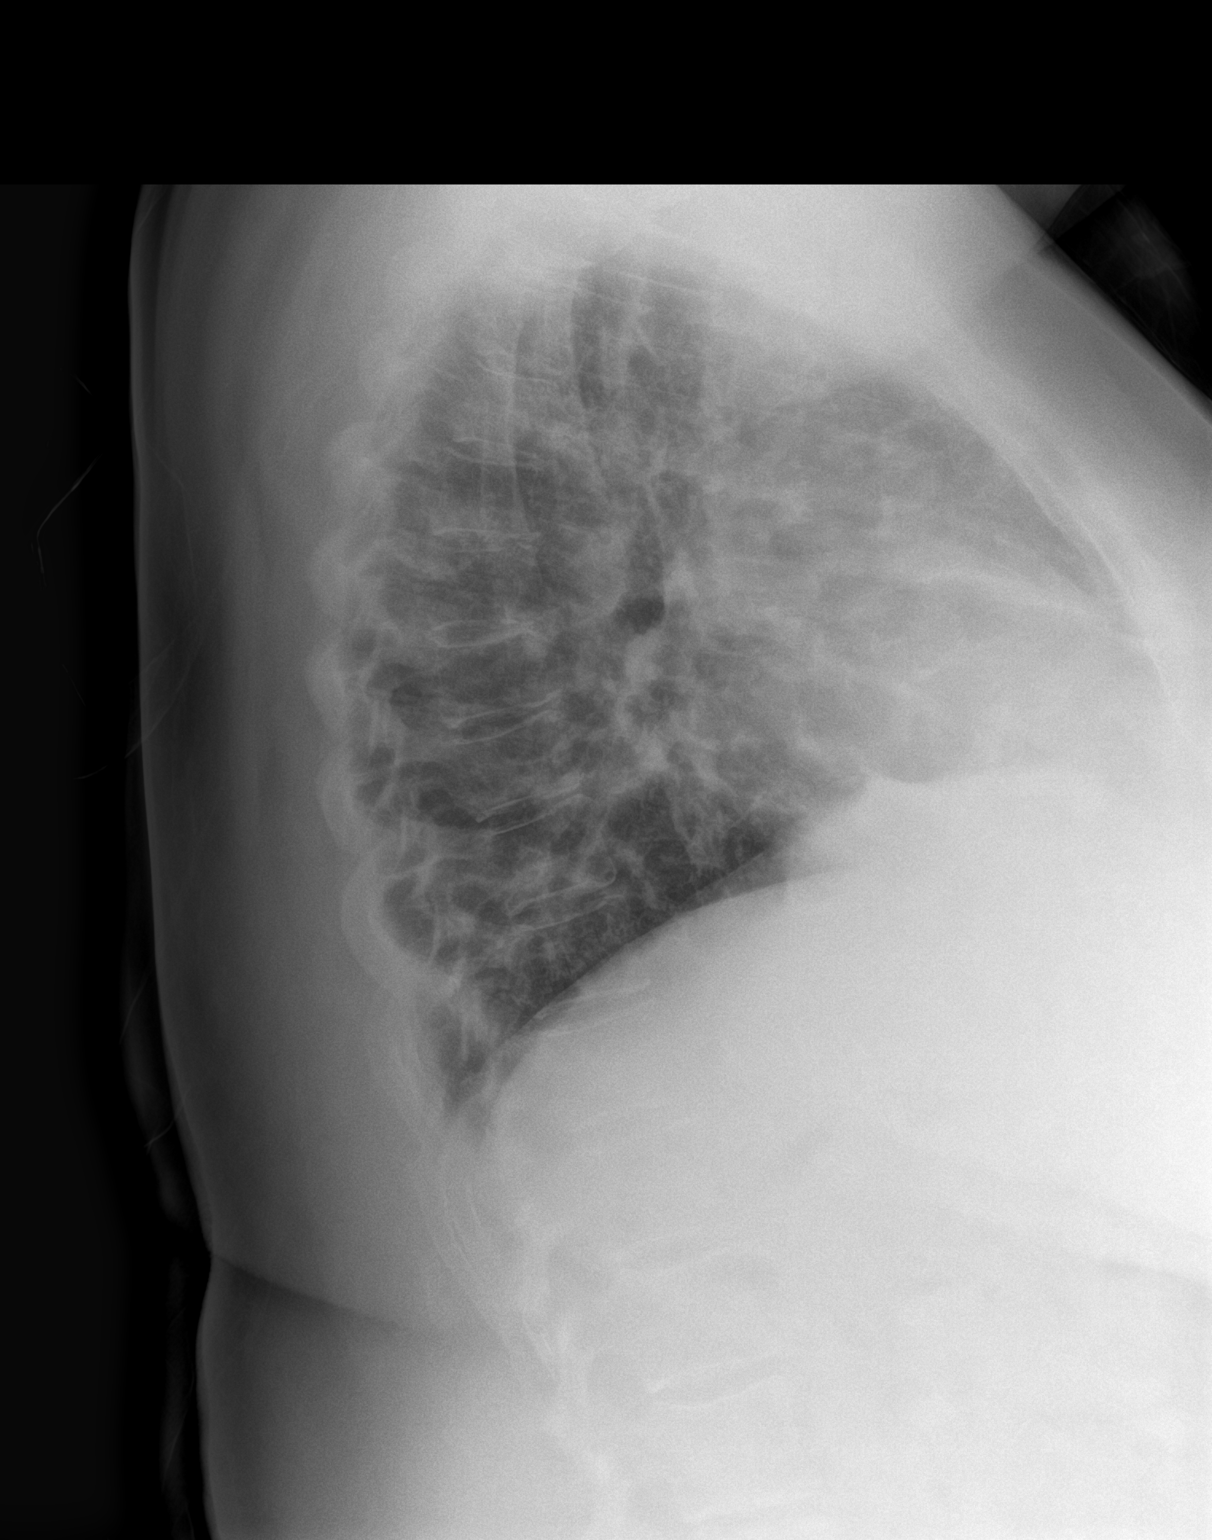

[chest pa (2 of 2)]
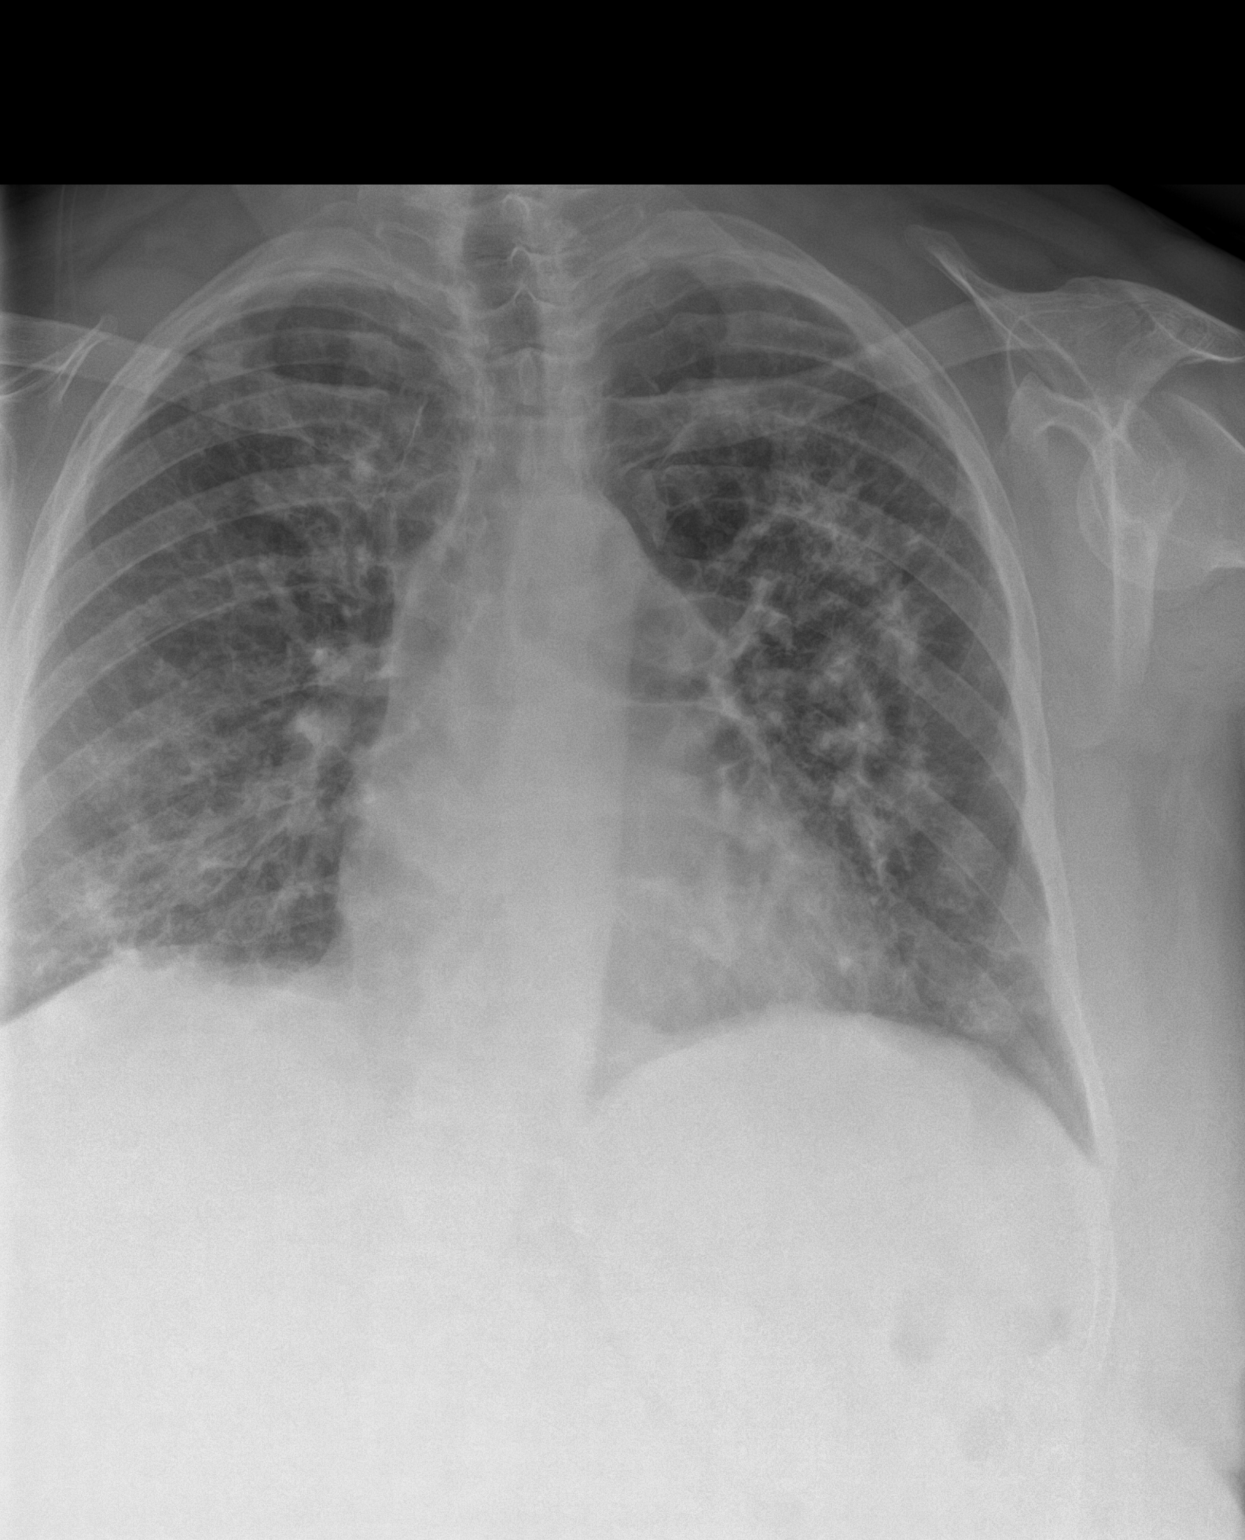

[3 of 3 positions shown; findings below may reference images not displayed]

FINDINGS: Left costophrenic angle excluded from the field of view. Lower lung
volumes from prior exam. There is diffuse interstitial and bronchial
thickening. Superimposed patchy airspace opacity in the left greater
than right perihilar lungs. The heart is normal in size. Unchanged
mediastinal contours. No significant pleural effusion. No
pneumothorax. No acute osseous abnormalities are seen.
IMPRESSION: 1. Diffuse interstitial and bronchial thickening which may be due to
bronchial inflammation or congestive.
2. Additional patchy bilateral perihilar opacities, more typical of
multifocal infection. Recommend correlation for infectious symptoms.
